# Patient Record
Sex: Male | Born: 1985
Health system: Southern US, Community
[De-identification: ages and names within clinical notes are randomized; demographics above are authoritative.]

## PROBLEM LIST (undated history)

## (undated) DIAGNOSIS — E1142 Type 2 diabetes mellitus with diabetic polyneuropathy: Secondary | ICD-10-CM

## (undated) DIAGNOSIS — J4530 Mild persistent asthma, uncomplicated: Secondary | ICD-10-CM

## (undated) DIAGNOSIS — J45909 Unspecified asthma, uncomplicated: Secondary | ICD-10-CM

## (undated) DIAGNOSIS — M14672 Charcot's joint, left ankle and foot: Secondary | ICD-10-CM

## (undated) DIAGNOSIS — E119 Type 2 diabetes mellitus without complications: Secondary | ICD-10-CM

## (undated) DIAGNOSIS — I509 Heart failure, unspecified: Secondary | ICD-10-CM

## (undated) DIAGNOSIS — E669 Obesity, unspecified: Secondary | ICD-10-CM

## (undated) DIAGNOSIS — I10 Essential (primary) hypertension: Secondary | ICD-10-CM

## (undated) DIAGNOSIS — E1169 Type 2 diabetes mellitus with other specified complication: Secondary | ICD-10-CM

## (undated) DIAGNOSIS — G4733 Obstructive sleep apnea (adult) (pediatric): Secondary | ICD-10-CM

## (undated) DIAGNOSIS — M79672 Pain in left foot: Secondary | ICD-10-CM

## (undated) DIAGNOSIS — I5022 Chronic systolic (congestive) heart failure: Secondary | ICD-10-CM

## (undated) DIAGNOSIS — E1140 Type 2 diabetes mellitus with diabetic neuropathy, unspecified: Secondary | ICD-10-CM

## (undated) HISTORY — PX: CARDIAC CATHETERIZATION: SHX172

## (undated) HISTORY — DX: Charcot's joint, left ankle and foot: M14.672

## (undated) HISTORY — DX: Type 2 diabetes mellitus with diabetic polyneuropathy: E11.42

## (undated) HISTORY — DX: Morbid (severe) obesity due to excess calories: E66.01

## (undated) HISTORY — DX: Type 2 diabetes mellitus with diabetic neuropathy, unspecified: E11.40

## (undated) HISTORY — DX: Mild persistent asthma, uncomplicated: J45.30

## (undated) HISTORY — DX: Pain in left foot: M79.672

## (undated) HISTORY — DX: Essential (primary) hypertension: I10

## (undated) HISTORY — PX: FOOT SURGERY: SHX648

## (undated) HISTORY — DX: Obesity, unspecified: E66.9

## (undated) HISTORY — DX: Type 2 diabetes mellitus without complications: E11.9

## (undated) HISTORY — DX: Obstructive sleep apnea (adult) (pediatric): G47.33

## (undated) HISTORY — DX: Chronic systolic (congestive) heart failure: I50.22

## (undated) HISTORY — DX: Type 2 diabetes mellitus with other specified complication: E11.69

---
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
## 2012-01-03 ENCOUNTER — Encounter (HOSPITAL_COMMUNITY): Payer: Self-pay | Admitting: Emergency Medicine

## 2012-01-03 ENCOUNTER — Emergency Department (INDEPENDENT_AMBULATORY_CARE_PROVIDER_SITE_OTHER)
Admission: EM | Admit: 2012-01-03 | Discharge: 2012-01-03 | Disposition: A | Discharge status: Home / Self Care | Payer: Self-pay | Source: Home / Self Care

## 2012-01-03 DIAGNOSIS — J9801 Acute bronchospasm: Secondary | ICD-10-CM

## 2012-01-03 DIAGNOSIS — R609 Edema, unspecified: Secondary | ICD-10-CM

## 2012-01-03 DIAGNOSIS — I10 Essential (primary) hypertension: Secondary | ICD-10-CM

## 2012-01-03 HISTORY — DX: Essential (primary) hypertension: I10

## 2012-01-03 HISTORY — DX: Unspecified asthma, uncomplicated: J45.909

## 2012-01-03 MED ORDER — ALBUTEROL SULFATE HFA 108 (90 BASE) MCG/ACT IN AERS: 2.0000 | INHALATION_SPRAY | RESPIRATORY_TRACT | Status: DC | PRN

## 2012-01-03 MED ORDER — HYDROCHLOROTHIAZIDE 25 MG PO TABS: 25.0000 mg | ORAL_TABLET | Freq: Every day | ORAL | Status: DC

## 2012-01-03 NOTE — ED Notes (Signed)
Reports he drank 3-12 pack (cans) of soft drinks over the past 3 days.

## 2012-01-03 NOTE — ED Notes (Signed)
Reports he was on hctz, but 4 years this drug was stopped.

## 2012-01-03 NOTE — ED Provider Notes (Signed)
History     CSN: [id number]  Arrival date & time 01/03/12  1619   None     Chief Complaint  Patient presents with  . Foot Swelling    (Consider location/radiation/quality/duration/timing/severity/associated sxs/prior treatment) HPI Comments: 26 year old male presents with bilateral lower extremity pitting edema. He states this was caused by drinking 36 cans of soda yesterday. He denies any known medical problems. Although he did not offer any information initially I discovered that he had been diagnosed with hypertension several years ago and was placed on hydrochlorothiazide. After 3-4 months of taking the medicine the doctor told him his blood pressure was normal and he did not need to take any more. Is no additional complaints today.   Past Medical History  Diagnosis Date  . Hypertension   . Asthma     Past Surgical History  Procedure Date  . Foot surgery     arch surgery    No family history on file.  History  Substance Use Topics  . Smoking status: Never Smoker   . Smokeless tobacco: Not on file  . Alcohol Use: No      Review of Systems  Constitutional: Negative for fever, activity change and fatigue.  HENT: Negative.   Respiratory: Negative for cough, chest tightness and shortness of breath.   Cardiovascular: Positive for leg swelling. Negative for chest pain and palpitations.  Gastrointestinal: Negative.   Genitourinary: Negative.   Skin: Negative for pallor and rash.  Neurological: Negative.     Allergies  Hydrocodone  Home Medications   Current Outpatient Rx  Name  Route  Sig  Dispense  Refill  . ALBUTEROL SULFATE HFA 108 (90 BASE) MCG/ACT IN AERS   Inhalation   Inhale 2 puffs into the lungs every 4 (four) hours as needed for wheezing. Dispense with aerochamber   1 Inhaler   0   . HYDROCHLOROTHIAZIDE 25 MG PO TABS   Oral   Take 1 tablet (25 mg total) by mouth daily.   30 tablet   0     BP 154/94  Pulse 94  Temp 98.2 F (36.8 C)  (Oral)  Resp 18  SpO2 100%  Physical Exam  Constitutional: He is oriented to person, place, and time. No distress.       Morbidly obese  Eyes: Conjunctivae normal and EOM are normal.  Neck: Normal range of motion. Neck supple.  Cardiovascular: Normal rate and normal heart sounds.   Pulmonary/Chest: Effort normal. He has wheezes.       Respiratory distress but breath sounds reveal bilateral diffuse mild to moderate wheezing. The patient states he has not noticed this.  Musculoskeletal: Normal range of motion. He exhibits edema.       3+ pitting edema of the lower extremities and feet bilaterally.  Neurological: He is alert and oriented to person, place, and time. No cranial nerve deficit.  Skin: Skin is warm and dry. No rash noted. No erythema.  Psychiatric: He has a normal mood and affect.    ED Course  Procedures (including critical care time)  Labs Reviewed - No data to display No results found.   1. Peripheral edema   2. HTN (hypertension)   3. Morbid obesity   4. Bronchospasm       MDM  HCTZ 25 mg one daily Albuterol HFA 2 puffs 4 times a day when necessary wheezing He must find a physician soon for a full workup. He is given the name of Dr. Julio Sicks. Is also advised  that he has multiple risk factors for heart disease, diabetes, renal failure, stroke and blood clots; Including morbid obesity, lower extremity edema, Wheezing, hypertension and possible target in organ damage such as problems with his kidneys.       Hayden Rasmussen, NP 01/03/12 2118

## 2012-01-03 NOTE — ED Notes (Signed)
Pt. Stated, both my feet are swollen because i drank a 3 pack of 12 yesterday of soda.

## 2012-01-03 NOTE — ED Notes (Signed)
Reports feet swelling that was noticed today. Patient denies swelling in extremities.  Patient has pitting edema.  Pitting edema in lower extremities.  No pain, no sob.

## 2012-01-04 NOTE — ED Provider Notes (Signed)
Medical screening examination/treatment/procedure(s) were performed by resident physician or non-physician practitioner and as supervising physician I was immediately available for consultation/collaboration.   Tiasha Helvie DOUGLAS MD.    Naethan Bracewell D Shiana Rappleye, MD 01/04/12 1046 

## 2012-11-27 ENCOUNTER — Encounter (HOSPITAL_COMMUNITY): Payer: Self-pay | Admitting: Emergency Medicine

## 2012-11-27 ENCOUNTER — Emergency Department (HOSPITAL_COMMUNITY)
Admission: EM | Admit: 2012-11-27 | Discharge: 2012-11-27 | Disposition: A | Discharge status: Home / Self Care | Payer: Self-pay | Attending: Emergency Medicine | Admitting: Emergency Medicine

## 2012-11-27 DIAGNOSIS — E669 Obesity, unspecified: Secondary | ICD-10-CM | POA: Insufficient documentation

## 2012-11-27 DIAGNOSIS — R000 Tachycardia, unspecified: Secondary | ICD-10-CM | POA: Insufficient documentation

## 2012-11-27 DIAGNOSIS — I10 Essential (primary) hypertension: Secondary | ICD-10-CM | POA: Insufficient documentation

## 2012-11-27 DIAGNOSIS — Z79899 Other long term (current) drug therapy: Secondary | ICD-10-CM | POA: Insufficient documentation

## 2012-11-27 DIAGNOSIS — K089 Disorder of teeth and supporting structures, unspecified: Secondary | ICD-10-CM | POA: Insufficient documentation

## 2012-11-27 DIAGNOSIS — J45909 Unspecified asthma, uncomplicated: Secondary | ICD-10-CM | POA: Insufficient documentation

## 2012-11-27 DIAGNOSIS — K029 Dental caries, unspecified: Secondary | ICD-10-CM | POA: Insufficient documentation

## 2012-11-27 HISTORY — DX: Obesity, unspecified: E66.9

## 2012-11-27 MED ORDER — NAPROXEN 500 MG PO TABS: 500.0000 mg | ORAL_TABLET | Freq: Two times a day (BID) | ORAL | Status: DC

## 2012-11-27 MED ORDER — AMOXICILLIN 500 MG PO CAPS: 500.0000 mg | ORAL_CAPSULE | Freq: Three times a day (TID) | ORAL | Status: DC

## 2012-11-27 NOTE — ED Notes (Signed)
Pt reports left side top and bottom teeth that started hurting him yesterday.

## 2012-11-27 NOTE — ED Notes (Signed)
Unable to get patient's temperature due to patient had just drank cold water.

## 2012-11-27 NOTE — ED Provider Notes (Signed)
CSN: [id number]     Arrival date & time 11/27/12  1319 History   First MD Initiated Contact with Patient 11/27/12 1522     Chief Complaint  Patient presents with  . Dental Pain   (Consider location/radiation/quality/duration/timing/severity/associated sxs/prior Treatment) Patient is a 27 y.o. male presenting with tooth pain. The history is provided by the patient.  Dental Pain Location:  Upper and lower Severity:  Severe Onset quality:  Gradual Duration:  1 day Timing:  Constant Progression:  Worsening Associated symptoms: no fever and no headaches    Decklin Weddington is a 27 y.o. male who presents to the ED with dental pain. The pain stated yesterday. The pain is in the upper and lower teeth.  Past Medical History  Diagnosis Date  . Hypertension   . Asthma   . Obesity    Past Surgical History  Procedure Laterality Date  . Foot surgery      arch surgery   No family history on file. History  Substance Use Topics  . Smoking status: Never Smoker   . Smokeless tobacco: Not on file  . Alcohol Use: No    Review of Systems  Constitutional: Negative for fever and chills.  HENT: Positive for dental problem. Negative for ear pain and sore throat.   Eyes: Negative for visual disturbance.  Respiratory: Negative for cough.   Gastrointestinal: Negative for nausea, vomiting and abdominal pain.  Musculoskeletal: Negative for back pain.  Skin: Negative for rash.  Neurological: Negative for headaches.  Psychiatric/Behavioral: The patient is not nervous/anxious.     Allergies  Hydrocodone  Home Medications   Current Outpatient Rx  Name  Route  Sig  Dispense  Refill  . albuterol (PROVENTIL HFA;VENTOLIN HFA) 108 (90 BASE) MCG/ACT inhaler   Inhalation   Inhale 2 puffs into the lungs every 4 (four) hours as needed for wheezing. Dispense with aerochamber   1 Inhaler   0   . hydrochlorothiazide (HYDRODIURIL) 25 MG tablet   Oral   Take 1 tablet (25 mg total) by mouth daily.   30  tablet   0    BP 161/97  Pulse 98  Resp 20  Ht 6\' 2"  (1.88 m)  Wt 323 lb (146.512 kg)  BMI 41.45 kg/m2  SpO2 100% Physical Exam  Nursing note and vitals reviewed. Constitutional: He is oriented to person, place, and time. No distress.  Morbidly obese  HENT:  Head: Normocephalic and atraumatic.  Mouth/Throat: Uvula is midline, oropharynx is clear and moist and mucous membranes are normal. Dental caries present.    Tenderness of the first molar upper and lower with dental caries noted.   Eyes: Conjunctivae and EOM are normal.  Neck: Neck supple.  Cardiovascular: Tachycardia present.   Pulmonary/Chest: Effort normal and breath sounds normal.  Musculoskeletal: Normal range of motion.  Lymphadenopathy:    He has no cervical adenopathy.  Neurological: He is alert and oriented to person, place, and time. No cranial nerve deficit.  Skin: Skin is warm and dry.  Psychiatric: He has a normal mood and affect. His behavior is normal.    ED Course  Procedures   MDM  27 y.o. male with dental pain due to caries. Will treat with antibiotics and pain medication and he will follow up with the dental clinic. Discussed with the patient plan of care and all questioned fully answered. He is stable for discharge home without any immediate complications.     Medication List    TAKE these medications  amoxicillin 500 MG capsule  Commonly known as:  AMOXIL  Take 1 capsule (500 mg total) by mouth 3 (three) times daily.     naproxen 500 MG tablet  Commonly known as:  NAPROSYN  Take 1 tablet (500 mg total) by mouth 2 (two) times daily.      ASK your doctor about these medications       albuterol 108 (90 BASE) MCG/ACT inhaler  Commonly known as:  PROVENTIL HFA;VENTOLIN HFA  Inhale 2 puffs into the lungs every 4 (four) hours as needed for wheezing. Dispense with aerochamber     hydrochlorothiazide 25 MG tablet  Commonly known as:  HYDRODIURIL  Take 1 tablet (25 mg total) by mouth  daily.        BP rechecked prior to discharge and is 161/97  Patient with know hypertension and taking medication. He is to follow up with his PCP for his blood pressure.    [facility] Orlene Och, NP 11/28/12 [address] [place], [place] 11/28/12 1626

## 2012-11-27 NOTE — ED Notes (Signed)
Pt has dental pain on rt upper and lower side.  Pain for 2 days, NAD.

## 2012-11-30 NOTE — ED Provider Notes (Signed)
Medical screening examination/treatment/procedure(s) were performed by non-physician practitioner and as supervising physician I was immediately available for consultation/collaboration.   Shaniece Bussa W Alexys Lobello, MD 11/30/12 0828 

## 2014-04-21 DIAGNOSIS — E669 Obesity, unspecified: Secondary | ICD-10-CM

## 2014-04-21 DIAGNOSIS — E1169 Type 2 diabetes mellitus with other specified complication: Secondary | ICD-10-CM

## 2014-04-21 HISTORY — DX: Type 2 diabetes mellitus with other specified complication: E66.9

## 2014-04-21 HISTORY — DX: Type 2 diabetes mellitus with other specified complication: E11.69

## 2016-04-19 ENCOUNTER — Emergency Department (HOSPITAL_COMMUNITY)
Admission: EM | Admit: 2016-04-19 | Discharge: 2016-04-20 | DRG: 871 | Discharge status: Against Medical Advice | Payer: Medicaid - Out of State | Attending: Internal Medicine | Admitting: Internal Medicine

## 2016-04-19 ENCOUNTER — Encounter (HOSPITAL_COMMUNITY): Payer: Self-pay

## 2016-04-19 ENCOUNTER — Emergency Department (HOSPITAL_COMMUNITY): Payer: Medicaid - Out of State

## 2016-04-19 DIAGNOSIS — I509 Heart failure, unspecified: Secondary | ICD-10-CM | POA: Diagnosis present

## 2016-04-19 DIAGNOSIS — I110 Hypertensive heart disease with heart failure: Secondary | ICD-10-CM | POA: Diagnosis present

## 2016-04-19 DIAGNOSIS — E119 Type 2 diabetes mellitus without complications: Secondary | ICD-10-CM | POA: Diagnosis present

## 2016-04-19 DIAGNOSIS — J9601 Acute respiratory failure with hypoxia: Secondary | ICD-10-CM | POA: Diagnosis not present

## 2016-04-19 DIAGNOSIS — E1169 Type 2 diabetes mellitus with other specified complication: Secondary | ICD-10-CM | POA: Diagnosis present

## 2016-04-19 DIAGNOSIS — Z79899 Other long term (current) drug therapy: Secondary | ICD-10-CM | POA: Diagnosis not present

## 2016-04-19 DIAGNOSIS — Z8673 Personal history of transient ischemic attack (TIA), and cerebral infarction without residual deficits: Secondary | ICD-10-CM | POA: Diagnosis not present

## 2016-04-19 DIAGNOSIS — R6510 Systemic inflammatory response syndrome (SIRS) of non-infectious origin without acute organ dysfunction: Secondary | ICD-10-CM | POA: Diagnosis present

## 2016-04-19 DIAGNOSIS — Z791 Long term (current) use of non-steroidal anti-inflammatories (NSAID): Secondary | ICD-10-CM | POA: Diagnosis not present

## 2016-04-19 DIAGNOSIS — A419 Sepsis, unspecified organism: Secondary | ICD-10-CM

## 2016-04-19 DIAGNOSIS — R0603 Acute respiratory distress: Secondary | ICD-10-CM

## 2016-04-19 DIAGNOSIS — E669 Obesity, unspecified: Secondary | ICD-10-CM | POA: Diagnosis not present

## 2016-04-19 DIAGNOSIS — Z9111 Patient's noncompliance with dietary regimen: Secondary | ICD-10-CM | POA: Diagnosis not present

## 2016-04-19 DIAGNOSIS — R0602 Shortness of breath: Secondary | ICD-10-CM | POA: Diagnosis present

## 2016-04-19 HISTORY — DX: Heart failure, unspecified: I50.9

## 2016-04-19 LAB — COMPREHENSIVE METABOLIC PANEL WITH GFR
ALT: 41 U/L (ref 17–63)
AST: 51 U/L — ABNORMAL HIGH (ref 15–41)
Albumin: 3.2 g/dL — ABNORMAL LOW (ref 3.5–5.0)
Alkaline Phosphatase: 63 U/L (ref 38–126)
Anion gap: 13 (ref 5–15)
BUN: 12 mg/dL (ref 6–20)
CO2: 25 mmol/L (ref 22–32)
Calcium: 9.6 mg/dL (ref 8.9–10.3)
Chloride: 96 mmol/L — ABNORMAL LOW (ref 101–111)
Creatinine, Ser: 0.93 mg/dL (ref 0.61–1.24)
GFR calc Af Amer: >60 mL/min
GFR calc non Af Amer: >60 mL/min
Glucose, Bld: 348 mg/dL — ABNORMAL HIGH (ref 65–99)
Potassium: 4.4 mmol/L (ref 3.5–5.1)
Sodium: 134 mmol/L — ABNORMAL LOW (ref 135–145)
Total Bilirubin: 0.4 mg/dL (ref 0.3–1.2)
Total Protein: 7.1 g/dL (ref 6.5–8.1)

## 2016-04-19 LAB — I-STAT CG4 LACTIC ACID, ED
Lactic Acid, Venous: 3.84 mmol/L (ref 0.5–1.9)
Lactic Acid, Venous: 2.92 mmol/L (ref 0.5–1.9)

## 2016-04-19 LAB — I-STAT VENOUS BLOOD GAS, ED
Acid-base deficit: 2 mmol/L (ref 0.0–2.0)
BICARBONATE: 22.7 mmol/L (ref 20.0–28.0)
O2 SAT: 72 %
PO2 VEN: 39 mmHg (ref 32.0–45.0)
TCO2: 24 mmol/L (ref 0–100)
pCO2, Ven: 39.8 mmHg — ABNORMAL LOW (ref 44.0–60.0)
pH, Ven: 7.365 (ref 7.250–7.430)

## 2016-04-19 LAB — CBC WITH DIFFERENTIAL/PLATELET
Basophils Absolute: 0 10*3/uL (ref 0.0–0.1)
Basophils Relative: 0 %
Eosinophils Absolute: 0 10*3/uL (ref 0.0–0.7)
Eosinophils Relative: 0 %
HCT: 39.5 % (ref 39.0–52.0)
Hemoglobin: 13.2 g/dL (ref 13.0–17.0)
Lymphocytes Relative: 27 %
Lymphs Abs: 2.7 10*3/uL (ref 0.7–4.0)
MCH: 29.9 pg (ref 26.0–34.0)
MCHC: 33.4 g/dL (ref 30.0–36.0)
MCV: 89.6 fL (ref 78.0–100.0)
Monocytes Absolute: 0.7 10*3/uL (ref 0.1–1.0)
Monocytes Relative: 7 %
Neutro Abs: 6.7 10*3/uL (ref 1.7–7.7)
Neutrophils Relative %: 66 %
Platelets: 287 10*3/uL (ref 150–400)
RBC: 4.41 MIL/uL (ref 4.22–5.81)
RDW: 12.9 % (ref 11.5–15.5)
WBC: 10.1 10*3/uL (ref 4.0–10.5)

## 2016-04-19 LAB — I-STAT TROPONIN, ED: Troponin i, poc: 0.14 ng/mL (ref 0.00–0.08)

## 2016-04-19 LAB — BRAIN NATRIURETIC PEPTIDE: B Natriuretic Peptide: 409.4 pg/mL — ABNORMAL HIGH (ref 0.0–100.0)

## 2016-04-19 MED ORDER — DEXTROSE 5 % IV SOLN
1.0000 g | Freq: Once | INTRAVENOUS | Status: AC
  Administered 2016-04-19: 1 g via INTRAVENOUS
  Filled 2016-04-19: qty 10

## 2016-04-19 MED ORDER — AMLODIPINE BESYLATE 5 MG PO TABS
10.0000 mg | ORAL_TABLET | Freq: Every day | ORAL | Status: DC
  Administered 2016-04-19: 10 mg via ORAL
  Filled 2016-04-19: qty 2

## 2016-04-19 MED ORDER — METOPROLOL TARTRATE 25 MG PO TABS
25.0000 mg | ORAL_TABLET | Freq: Once | ORAL | Status: AC
  Administered 2016-04-19: 25 mg via ORAL
  Filled 2016-04-19: qty 1

## 2016-04-19 MED ORDER — DEXTROSE 5 % IV SOLN: 1.0000 g | INTRAVENOUS | Status: DC

## 2016-04-19 MED ORDER — SODIUM CHLORIDE 0.9 % IV BOLUS (SEPSIS)
1000.0000 mL | Freq: Once | INTRAVENOUS | Status: AC
  Administered 2016-04-19: 1000 mL via INTRAVENOUS

## 2016-04-19 MED ORDER — IBUPROFEN 800 MG PO TABS
800.0000 mg | ORAL_TABLET | Freq: Once | ORAL | Status: AC
  Administered 2016-04-19: 800 mg via ORAL
  Filled 2016-04-19: qty 1

## 2016-04-19 MED ORDER — METOCLOPRAMIDE HCL 5 MG/ML IJ SOLN
10.0000 mg | Freq: Once | INTRAMUSCULAR | Status: AC
  Administered 2016-04-19: 10 mg via INTRAVENOUS
  Filled 2016-04-19: qty 2

## 2016-04-19 MED ORDER — AMLODIPINE BESYLATE 5 MG PO TABS: 5.0000 mg | ORAL_TABLET | Freq: Every day | ORAL | Status: DC

## 2016-04-19 MED ORDER — DEXTROSE 5 % IV SOLN: 500.0000 mg | INTRAVENOUS | Status: DC

## 2016-04-19 MED ORDER — DEXTROSE 5 % IV SOLN
500.0000 mg | Freq: Once | INTRAVENOUS | Status: AC
  Administered 2016-04-19: 500 mg via INTRAVENOUS
  Filled 2016-04-19: qty 500

## 2016-04-19 MED ORDER — SODIUM CHLORIDE 0.9 % IV BOLUS (SEPSIS): 1000.0000 mL | Freq: Once | INTRAVENOUS | Status: DC

## 2016-04-19 MED ORDER — ACETAMINOPHEN 500 MG PO TABS
1000.0000 mg | ORAL_TABLET | Freq: Once | ORAL | Status: AC
  Administered 2016-04-19: 1000 mg via ORAL
  Filled 2016-04-19: qty 2

## 2016-04-19 NOTE — ED Provider Notes (Signed)
845 pmComplains of cough productive of yellowish and greenish sputum for the past 3-4 days. He vomited one time yesterday. Other complaints include shortness of breath. He admits to not taking his blood pressure medication  today. On exam appears uncomfortable. Speaks in short sentences Lungs with Tachypnea. lungs clear to auscultation. Heart tachycardic regular rhythm abdomen morbidly obese, nontender. Code sepsis called based on Sirs criteria tachypnea and tachycardia and fever. Likely source of infection respiratory.  11:40 PM patient's breathing is improved and he looks improved after treatment in the emergency department. He appears in less than respiratory distress. Is able to speak in paragraphs  Chest x-ray viewed by me Results for orders placed or performed during the hospital encounter of 04/19/16  CBC with Differential  Result Value Ref Range   WBC 10.1 4.0 - 10.5 K/uL   RBC 4.41 4.22 - 5.81 MIL/uL   Hemoglobin 13.2 13.0 - 17.0 g/dL   HCT 06.2 37.6 - 28.3 %   MCV 89.6 78.0 - 100.0 fL   MCH 29.9 26.0 - 34.0 pg   MCHC 33.4 30.0 - 36.0 g/dL   RDW 15.1 76.1 - 60.7 %   Platelets 287 150 - 400 K/uL   Neutrophils Relative % 66 %   Neutro Abs 6.7 1.7 - 7.7 K/uL   Lymphocytes Relative 27 %   Lymphs Abs 2.7 0.7 - 4.0 K/uL   Monocytes Relative 7 %   Monocytes Absolute 0.7 0.1 - 1.0 K/uL   Eosinophils Relative 0 %   Eosinophils Absolute 0.0 0.0 - 0.7 K/uL   Basophils Relative 0 %   Basophils Absolute 0.0 0.0 - 0.1 K/uL  Comprehensive metabolic panel  Result Value Ref Range   Sodium 134 (L) 135 - 145 mmol/L   Potassium 4.4 3.5 - 5.1 mmol/L   Chloride 96 (L) 101 - 111 mmol/L   CO2 25 22 - 32 mmol/L   Glucose, Bld 348 (H) 65 - 99 mg/dL   BUN 12 6 - 20 mg/dL   Creatinine, Ser 3.71 0.61 - 1.24 mg/dL   Calcium 9.6 8.9 - 06.2 mg/dL   Total Protein 7.1 6.5 - 8.1 g/dL   Albumin 3.2 (L) 3.5 - 5.0 g/dL   AST 51 (H) 15 - 41 U/L   ALT 41 17 - 63 U/L   Alkaline Phosphatase 63 38 - 126 U/L    Total Bilirubin 0.4 0.3 - 1.2 mg/dL   GFR calc non Af Amer >60 >60 mL/min   GFR calc Af Amer >60 >60 mL/min   Anion gap 13 5 - 15  Brain natriuretic peptide  Result Value Ref Range   B Natriuretic Peptide 409.4 (H) 0.0 - 100.0 pg/mL  I-Stat CG4 Lactic Acid, ED  Result Value Ref Range   Lactic Acid, Venous 3.84 (HH) 0.5 - 1.9 mmol/L   Comment NOTIFIED PHYSICIAN   I-Stat Troponin, ED (not at [facility])  Result Value Ref Range   Troponin i, poc 0.14 (HH) 0.00 - 0.08 ng/mL   Comment NOTIFIED PHYSICIAN    Comment 3          I-Stat Venous Blood Gas, ED (order at [facility] only)  Result Value Ref Range   pH, Ven 7.365 7.250 - 7.430   pCO2, Ven 39.8 (L) 44.0 - 60.0 mmHg   pO2, Ven 39.0 32.0 - 45.0 mmHg   Bicarbonate 22.7 20.0 - 28.0 mmol/L   TCO2 24 0 - 100 mmol/L   O2 Saturation 72.0 %   Acid-base deficit  2.0 0.0 - 2.0 mmol/L   Patient temperature HIDE    Sample type VENOUS    Comment NOTIFIED PHYSICIAN   I-Stat CG4 Lactic Acid, ED  Result Value Ref Range   Lactic Acid, Venous 2.92 (HH) 0.5 - 1.9 mmol/L   Comment NOTIFIED PHYSICIAN    Dg Chest Port 1 View  Result Date: 04/19/2016 CLINICAL DATA:  Fevers at home with cough since sputum production for past 3 days EXAM: PORTABLE CHEST 1 VIEW COMPARISON:  None. FINDINGS: The heart size and mediastinal contours are within normal limits. Both lungs are clear. The visualized skeletal structures are unremarkable. IMPRESSION: No active disease. Electronically Signed   By: Elige Ko   On: 04/19/2016 21:41  CRITICAL CARE Performed by: Doug Sou Total critical care time: 30 minutes Critical care time was exclusive of separately billable procedures and treating other patients. Critical care was necessary to treat or prevent imminent or life-threatening deterioration. Critical care was time spent personally by me on the following activities: development of treatment plan with patient and/or surrogate as well as nursing, discussions with  consultants, evaluation of patient's response to treatment, examination of patient, obtaining history from patient or surrogate, ordering and performing treatments and interventions, ordering and review of laboratory studies, ordering and review of radiographic studies, pulse oximetry and re-evaluation of patient's condition.     Doug Sou, MD 04/19/16 [phone]

## 2016-04-19 NOTE — ED Notes (Signed)
Pt reported to this RN that central chest pain is back. Pt describes pain as "poking" and tightness. Pt rates pain 8/10. Pt is hyperventilating, and restless in bed. Pt encouraged to slow breathing. Dr. Ethelda Chick aware and at bedside assessing pt.

## 2016-04-19 NOTE — Progress Notes (Signed)
Pharmacy Antibiotic Note  Jonathan [name] is a 31 y.o. male admitted on 04/19/2016 with sepsis/CAP.  Pharmacy has been consulted for azithromycin and ceftriaxone dosing.  Patient reported fevers at home with cough and sputum production for past 3 days. He has Tmax 100.5 and tachycardia in the ED. WBC is 10.1 and lactic acid 3.84.   Plan: Azithromycin 500mg  IV every 24 hours Ceftriaxone 1g IV every 24 hours Follow up clinical progress and ability to take oral mediations   Temp (24hrs), Avg:100.5 F (38.1 C), Min:100.5 F (38.1 C), Max:100.5 F (38.1 C)   Recent Labs Lab 04/19/16 2005 04/19/16 2021  WBC 10.1  --   LATICACIDVEN  --  3.84*    CrCl cannot be calculated (No order found.).    Allergies  Allergen Reactions  . Hydrocodone     Antimicrobials this admission: 3/5 azithromycin >>  3/5 ceftriaxone >>   Dose adjustments this admission: N/A  Microbiology results: N/A  Thank you for allowing pharmacy to be a part of this patient's care.  Milus Mallick [facility] 04/19/2016 9:02 PM

## 2016-04-19 NOTE — ED Notes (Signed)
MD and RN Notified of I-stat Lactic Acid resulting 3.84.

## 2016-04-19 NOTE — ED Triage Notes (Signed)
Pt complaining of SOB. Pt states cough x 3 days, coughing up greenish/red sputum. Pt states fevers and chills at home. Pt tachy at triage, 130bpm. Pt a/o x 4 at triage.

## 2016-04-19 NOTE — ED Notes (Signed)
Code Sepsis @ 2059

## 2016-04-19 NOTE — ED Notes (Signed)
MD and RN notified of I-STAT Troponin resulting  0.14

## 2016-04-19 NOTE — ED Provider Notes (Signed)
[facility]-EMERGENCY DEPT Provider Note   CSN: [id number] Arrival date & time: 04/19/16  1955     History   Chief Complaint Chief Complaint  Patient presents with  . Shortness of Breath  . Tachycardia    HPI Jonathan [name] is a 31 y.o. male.  HPI Pt complaining of SOB. Pt states cough x 3 days, coughing up greenish/red sputum. Pt states fevers and chills at home Wife is at the bedside and rely some of the history as well They state that patient began with a sore throat and congestion, cough. He was waking up at night with chills and feeling feverish. He states he feels like he is breathing rapidly and sometimes coughs so much that he feels short of breath. When he coughed a lot he does have some chest pain but does not have constant chest pain. He states he has had a stroke but does not have any focal deficits or deficits from the stroke and cannot remember details regarding this. He is also diabetic and hypertensive and took his medications today He usually takes amlodipine and Lasix but does endorse he has not been eating very well recently over last 2 weeks as his wife was not cooking He denies any history of CHF but does state that his legs have appeared more swollen recently Denies any recent antibiotics, no dysuria, no recent hospitalization, no history of PE or DVT, no hemoptysis, no recent long trip, no recent surgery, no leg swelling or calf pain, patient is not on any blood thinners or testosterone Never a smoker  Past Medical History:  Diagnosis Date  . Asthma   . Hypertension   . Obesity     Patient Active Problem List   Diagnosis Date Noted  . SIRS (systemic inflammatory response syndrome) (HCC) 04/20/2016    Past Surgical History:  Procedure Laterality Date  . FOOT SURGERY     arch surgery       Home Medications    Prior to Admission medications   Medication Sig Start Date End Date Taking? Authorizing Provider  albuterol (PROVENTIL HFA;VENTOLIN HFA) 108  (90 BASE) MCG/ACT inhaler Inhale 2 puffs into the lungs every 4 (four) hours as needed for wheezing. Dispense with aerochamber 01/03/12  Yes Hayden Rasmussen, NP  amLODipine (NORVASC) 5 MG tablet Take 5 mg by mouth daily.   Yes Historical Provider, MD  Cholecalciferol (VITAMIN D PO) Take 1 tablet by mouth daily.   Yes Historical Provider, MD  fluticasone furoate-vilanterol (BREO ELLIPTA) 200-25 MCG/INH AEPB Inhale 1 puff into the lungs daily.   Yes Historical Provider, MD  furosemide (LASIX) 40 MG tablet Take 40 mg by mouth 2 (two) times daily.   Yes Historical Provider, MD  amoxicillin (AMOXIL) 500 MG capsule Take 1 capsule (500 mg total) by mouth 3 (three) times daily. Patient not taking: Reported on 04/19/2016 11/27/12   Janne Napoleon, NP  hydrochlorothiazide (HYDRODIURIL) 25 MG tablet Take 1 tablet (25 mg total) by mouth daily. Patient not taking: Reported on 04/19/2016 01/03/12   Hayden Rasmussen, NP  naproxen (NAPROSYN) 500 MG tablet Take 1 tablet (500 mg total) by mouth 2 (two) times daily. Patient not taking: Reported on 04/19/2016 11/27/12   Janne Napoleon, NP    Family History History reviewed. No pertinent family history.  Social History Social History  Substance Use Topics  . Smoking status: Never Smoker  . Smokeless tobacco: Never Used  . Alcohol use No     Allergies   Hydrocodone  Review of Systems Review of Systems  Constitutional: Positive for fever.  Allergic/Immunologic: Negative for immunocompromised state.  All other systems reviewed and are negative.    Physical Exam Updated Vital Signs BP (!) 201/149   Pulse (!) 125   Temp 100.5 F (38.1 C) (Oral)   Resp 22   SpO2 98%   Physical Exam  Constitutional: He appears well-developed and well-nourished. No distress.  HENT:  Head: Normocephalic and atraumatic.  Nose: Nose normal.  Mouth/Throat: Oropharynx is clear and moist. No oropharyngeal exudate.  Eyes: Conjunctivae are normal. Pupils are equal, round, and reactive to  light. Right eye exhibits no discharge. Left eye exhibits no discharge.  Neck: Normal range of motion. Neck supple.  Cardiovascular: Regular rhythm.   No murmur heard. tachycardic  Pulmonary/Chest: Breath sounds normal. He is in respiratory distress. He has no wheezes. He has no rales.  tachypnic  Abdominal: Soft. Bowel sounds are normal. He exhibits no distension and no mass. There is no tenderness. There is no rebound and no guarding.  Musculoskeletal: He exhibits no edema.  Neurological: He is alert.  Skin: Skin is warm. He is not diaphoretic.  Psychiatric: He has a normal mood and affect.     ED Treatments / Results  Labs (all labs ordered are listed, but only abnormal results are displayed) Labs Reviewed  COMPREHENSIVE METABOLIC PANEL - Abnormal; Notable for the following:       Result Value   Sodium 134 (*)    Chloride 96 (*)    Glucose, Bld 348 (*)    Albumin 3.2 (*)    AST 51 (*)    All other components within normal limits  BRAIN NATRIURETIC PEPTIDE - Abnormal; Notable for the following:    B Natriuretic Peptide 409.4 (*)    All other components within normal limits  I-STAT CG4 LACTIC ACID, ED - Abnormal; Notable for the following:    Lactic Acid, Venous 3.84 (*)    All other components within normal limits  I-STAT TROPOININ, ED - Abnormal; Notable for the following:    Troponin i, poc 0.14 (*)    All other components within normal limits  I-STAT VENOUS BLOOD GAS, ED - Abnormal; Notable for the following:    pCO2, Ven 39.8 (*)    All other components within normal limits  I-STAT CG4 LACTIC ACID, ED - Abnormal; Notable for the following:    Lactic Acid, Venous 2.92 (*)    All other components within normal limits  CULTURE, BLOOD (ROUTINE X 2)  CULTURE, BLOOD (ROUTINE X 2)  CBC WITH DIFFERENTIAL/PLATELET  INFLUENZA PANEL BY PCR (TYPE A & B)    EKG  EKG Interpretation  Date/Time:  Monday April 19 2016 22:05:36 EST Ventricular Rate:  123 PR Interval:    QRS  Duration: 133 QT Interval:  349 QTC Calculation: 500 R Axis:   -171 Text Interpretation:  Sinus tachycardia Probable left atrial enlargement RBBB and LPFB No significant change since last tracing Confirmed by Ethelda Chick  MD, SAM [phone]) on 04/19/2016 10:14:49 PM       Radiology Dg Chest Port 1 View  Result Date: 04/19/2016 CLINICAL DATA:  Fevers at home with cough since sputum production for past 3 days EXAM: PORTABLE CHEST 1 VIEW COMPARISON:  None. FINDINGS: The heart size and mediastinal contours are within normal limits. Both lungs are clear. The visualized skeletal structures are unremarkable. IMPRESSION: No active disease. Electronically Signed   By: Elige Ko   On: 04/19/2016 21:41  Procedures Procedures (including critical care time)  Medications Ordered in ED Medications  azithromycin (ZITHROMAX) 500 mg in dextrose 5 % 250 mL IVPB (not administered)  cefTRIAXone (ROCEPHIN) 1 g in dextrose 5 % 50 mL IVPB (not administered)  amLODipine (NORVASC) tablet 10 mg (10 mg Oral Given 04/19/16 2224)  albuterol (PROVENTIL,VENTOLIN) solution continuous neb (not administered)  acetaminophen (TYLENOL) tablet 1,000 mg (1,000 mg Oral Given 04/19/16 2147)  ibuprofen (ADVIL,MOTRIN) tablet 800 mg (800 mg Oral Given 04/19/16 2147)  metoCLOPramide (REGLAN) injection 10 mg (10 mg Intravenous Given 04/19/16 2148)  sodium chloride 0.9 % bolus 1,000 mL (0 mLs Intravenous Stopped 04/19/16 2200)  sodium chloride 0.9 % bolus 1,000 mL (0 mLs Intravenous Stopped 04/20/16 0016)  cefTRIAXone (ROCEPHIN) 1 g in dextrose 5 % 50 mL IVPB (0 g Intravenous Stopped 04/19/16 2159)  azithromycin (ZITHROMAX) 500 mg in dextrose 5 % 250 mL IVPB (0 mg Intravenous Stopped 04/19/16 2304)  metoprolol tartrate (LOPRESSOR) tablet 25 mg (25 mg Oral Given 04/19/16 2224)     Initial Impression / Assessment and Plan / ED Course  I have reviewed the triage vital signs and the nursing notes.  Pertinent labs & imaging results that were  available during my care of the patient were reviewed by me and considered in my medical decision making (see chart for details).     Code sepsis activated upon patient arrival, covered with community acquired pneumonia antibiotics given concern for such. However, chest x-ray without pneumonia. Patient does have some viral etiology symptoms which I suspect are driving part her symptoms but less likely myo carditis. Patient does have a known diagnosis of CHF and has not been compliant with his diet over the last 2 weeks, also has an exacerbation of his CHF. Patient also is supposed to have a CPAP but has not had a CPAP at home as his insurance has not covered it, suspect significant component. Patient assessed multiple times throughout his ED course and noted to become hypoxic while sleeping, to 85% with an excellent waveform: However, if patient woken up, he is 96% on room air.  Sepsis - Repeat Assessment  Performed at:    12:22  Vitals     Blood pressure 140/83, pulse 108, temperature 99 F (37.2 C), temperature source Oral, resp. rate 22, SpO2 90 %.  Heart:     Regular rate and rhythm  Lungs:    Wheezing  Capillary Refill:   <2 sec  Peripheral Pulse:   Radial pulse palpable  Skin:     Normal Color  Did initially get fluids due to LA, but now trending down; albuterol ordered for wheezing  Given the patient has ongoing tachycardia, shortness of breath from CHF exacerbation as well as infectious etiology, will admit for further management and monitoring  Final Clinical Impressions(s) / ED Diagnoses   Final diagnoses:  Acute on chronic congestive heart failure, unspecified congestive heart failure type [facility])    New Prescriptions New Prescriptions   No medications on file     Sidney Ace, MD 04/20/16 0031    Doug Sou, MD 04/20/16 [phone]

## 2016-04-20 ENCOUNTER — Encounter (HOSPITAL_COMMUNITY): Payer: Self-pay | Admitting: Internal Medicine

## 2016-04-20 DIAGNOSIS — J9601 Acute respiratory failure with hypoxia: Secondary | ICD-10-CM | POA: Diagnosis not present

## 2016-04-20 DIAGNOSIS — E669 Obesity, unspecified: Secondary | ICD-10-CM | POA: Diagnosis not present

## 2016-04-20 DIAGNOSIS — E1169 Type 2 diabetes mellitus with other specified complication: Secondary | ICD-10-CM

## 2016-04-20 DIAGNOSIS — R6510 Systemic inflammatory response syndrome (SIRS) of non-infectious origin without acute organ dysfunction: Secondary | ICD-10-CM | POA: Diagnosis not present

## 2016-04-20 HISTORY — DX: Type 2 diabetes mellitus with other specified complication: E11.69

## 2016-04-20 LAB — INFLUENZA PANEL BY PCR (TYPE A & B)
Influenza A By PCR: NEGATIVE
Influenza B By PCR: NEGATIVE

## 2016-04-20 MED ORDER — BUDESONIDE 0.25 MG/2ML IN SUSP
0.2500 mg | Freq: Two times a day (BID) | RESPIRATORY_TRACT | Status: DC
  Filled 2016-04-20 (×2): qty 2

## 2016-04-20 MED ORDER — LEVALBUTEROL HCL 0.63 MG/3ML IN NEBU: 0.6300 mg | INHALATION_SOLUTION | RESPIRATORY_TRACT | Status: DC | PRN

## 2016-04-20 MED ORDER — ACETAMINOPHEN 650 MG RE SUPP: 650.0000 mg | Freq: Four times a day (QID) | RECTAL | Status: DC | PRN

## 2016-04-20 MED ORDER — ACETAMINOPHEN 325 MG PO TABS: 650.0000 mg | ORAL_TABLET | Freq: Four times a day (QID) | ORAL | Status: DC | PRN

## 2016-04-20 MED ORDER — ENOXAPARIN SODIUM 40 MG/0.4ML ~~LOC~~ SOLN: 40.0000 mg | SUBCUTANEOUS | Status: DC

## 2016-04-20 MED ORDER — ALBUTEROL (5 MG/ML) CONTINUOUS INHALATION SOLN: 5.0000 mg/h | INHALATION_SOLUTION | Freq: Once | RESPIRATORY_TRACT | Status: DC

## 2016-04-20 MED ORDER — LEVALBUTEROL HCL 0.63 MG/3ML IN NEBU
0.6300 mg | INHALATION_SOLUTION | RESPIRATORY_TRACT | Status: DC
  Administered 2016-04-20: 0.63 mg via RESPIRATORY_TRACT
  Filled 2016-04-20: qty 3

## 2016-04-20 MED ORDER — ALBUTEROL (5 MG/ML) CONTINUOUS INHALATION SOLN
10.0000 mg/h | INHALATION_SOLUTION | Freq: Once | RESPIRATORY_TRACT | Status: DC
  Filled 2016-04-20: qty 20

## 2016-04-20 MED ORDER — INSULIN ASPART 100 UNIT/ML ~~LOC~~ SOLN: 0.0000 [IU] | Freq: Three times a day (TID) | SUBCUTANEOUS | Status: DC

## 2016-04-20 NOTE — H&P (Signed)
History and Physical    Jonathan [name] WFU:[id number] DOB: 1985/06/27 DOA: 04/19/2016  PCP: No PCP Per Patient  Patient coming from: Home.  Chief Complaint: Shortness of breath and wheezing.  HPI: Jonathan [name] is a 31 y.o. male with history of CHF, morbid obesity, diabetes mellitus who is visiting [facility] from the ER presents to the ER because of worsening shortness of breath. Patient states over the last 4-5 days patient has been having wheezing and pleuritic type of chest pain and shortness of breath. Patient also having subjective feeling of fever and chills.   ED Course: In the ER patient was found to be tachycardic febrile with elevated lactate levels. Chest x-ray was unremarkable. Patient was given fluid bolus blood cultures obtained and started on empiric antibiotics for possible pneumonia.  Review of Systems: As per HPI, rest all negative.   Past Medical History:  Diagnosis Date  . Asthma   . CHF (congestive heart failure) (HCC)   . Hypertension   . Obesity     Past Surgical History:  Procedure Laterality Date  . CARDIAC CATHETERIZATION    . FOOT SURGERY     arch surgery     reports that he has never smoked. He has never used smokeless tobacco. He reports that he does not drink alcohol or use drugs.  Allergies  Allergen Reactions  . Hydrocodone     Hives     Family History  Problem Relation Age of Onset  . Diabetes Mellitus II Mother   . Congestive Heart Failure Neg Hx     Prior to Admission medications   Medication Sig Start Date End Date Taking? Authorizing Provider  albuterol (PROVENTIL HFA;VENTOLIN HFA) 108 (90 BASE) MCG/ACT inhaler Inhale 2 puffs into the lungs every 4 (four) hours as needed for wheezing. Dispense with aerochamber 01/03/12  Yes Hayden Rasmussen, NP  amLODipine (NORVASC) 5 MG tablet Take 5 mg by mouth daily.   Yes Historical Provider, MD  Cholecalciferol (VITAMIN D PO) Take 1 tablet by mouth daily.   Yes Historical Provider, MD  fluticasone  furoate-vilanterol (BREO ELLIPTA) 200-25 MCG/INH AEPB Inhale 1 puff into the lungs daily.   Yes Historical Provider, MD  furosemide (LASIX) 40 MG tablet Take 40 mg by mouth 2 (two) times daily.   Yes Historical Provider, MD  amoxicillin (AMOXIL) 500 MG capsule Take 1 capsule (500 mg total) by mouth 3 (three) times daily. Patient not taking: Reported on 04/19/2016 11/27/12   Janne Napoleon, NP  hydrochlorothiazide (HYDRODIURIL) 25 MG tablet Take 1 tablet (25 mg total) by mouth daily. Patient not taking: Reported on 04/19/2016 01/03/12   Hayden Rasmussen, NP  naproxen (NAPROSYN) 500 MG tablet Take 1 tablet (500 mg total) by mouth 2 (two) times daily. Patient not taking: Reported on 04/19/2016 11/27/12   Janne Napoleon, NP    Physical Exam: Vitals:   04/19/16 2300 04/19/16 2330 04/20/16 0015 04/20/16 0100  BP: (!) 179/117 140/86 140/83 173/98  Pulse: 116 108  101  Resp: (!) 28 22  23   Temp:      TempSrc:      SpO2: 93% 90%  96%      Constitutional: Obese not in distress. Vitals:   04/19/16 2300 04/19/16 2330 04/20/16 0015 04/20/16 0100  BP: (!) 179/117 140/86 140/83 173/98  Pulse: 116 108  101  Resp: (!) 28 22  23   Temp:      TempSrc:      SpO2: 93% 90%  96%  Eyes: Anicteric no pallor. ENMT: No discharge from the ears eyes nose and mouth. Neck: No mass felt. No JVD appreciated. Respiratory: Bilateral expiratory wheezes heard no crepitations. Cardiovascular: S1 and S2 heard no murmurs appreciated. Abdomen: Soft nontender bowel sounds present. Musculoskeletal: No edema. No joint effusion. Skin: No rash. Skin appears warm. Neurologic: Alert awake oriented to time place and person. Moves all extremities. Psychiatric: Appears normal. Normal affect.   Labs on Admission: I have personally reviewed following labs and imaging studies  CBC:  Recent Labs Lab 04/19/16 2005  WBC 10.1  NEUTROABS 6.7  HGB 13.2  HCT 39.5  MCV 89.6  PLT 287   Basic Metabolic Panel:  Recent Labs Lab  04/19/16 2005  NA 134*  K 4.4  CL 96*  CO2 25  GLUCOSE 348*  BUN 12  CREATININE 0.93  CALCIUM 9.6   GFR: CrCl cannot be calculated (Unknown ideal weight.). Liver Function Tests:  Recent Labs Lab 04/19/16 2005  AST 51*  ALT 41  ALKPHOS 63  BILITOT 0.4  PROT 7.1  ALBUMIN 3.2*   No results for input(s): LIPASE, AMYLASE in the last 168 hours. No results for input(s): AMMONIA in the last 168 hours. Coagulation Profile: No results for input(s): INR, PROTIME in the last 168 hours. Cardiac Enzymes: No results for input(s): CKTOTAL, CKMB, CKMBINDEX, TROPONINI in the last 168 hours. BNP (last 3 results) No results for input(s): PROBNP in the last 8760 hours. HbA1C: No results for input(s): HGBA1C in the last 72 hours. CBG: No results for input(s): GLUCAP in the last 168 hours. Lipid Profile: No results for input(s): CHOL, HDL, LDLCALC, TRIG, CHOLHDL, LDLDIRECT in the last 72 hours. Thyroid Function Tests: No results for input(s): TSH, T4TOTAL, FREET4, T3FREE, THYROIDAB in the last 72 hours. Anemia Panel: No results for input(s): VITAMINB12, FOLATE, FERRITIN, TIBC, IRON, RETICCTPCT in the last 72 hours. Urine analysis: No results found for: COLORURINE, APPEARANCEUR, LABSPEC, PHURINE, GLUCOSEU, HGBUR, BILIRUBINUR, KETONESUR, PROTEINUR, UROBILINOGEN, NITRITE, LEUKOCYTESUR Sepsis Labs: @LABRCNTIP (procalcitonin:4,lacticidven:4) )No results found for this or any previous visit (from the past 240 hour(s)).   Radiological Exams on Admission: Dg Chest Port 1 View  Result Date: 04/19/2016 CLINICAL DATA:  Fevers at home with cough since sputum production for past 3 days EXAM: PORTABLE CHEST 1 VIEW COMPARISON:  None. FINDINGS: The heart size and mediastinal contours are within normal limits. Both lungs are clear. The visualized skeletal structures are unremarkable. IMPRESSION: No active disease. Electronically Signed   By: Elige Ko   On: 04/19/2016 21:41    EKG: Independently  reviewed. Sinus tachycardia with RBBB.  Assessment/Plan Active Problems:   SIRS (systemic inflammatory response syndrome) (HCC)   Acute respiratory failure with hypoxia (HCC)   Congestive heart failure (CHF) (HCC)   Diabetes mellitus type 2 in obese (HCC)    1. SIRS - likely but possible pneumonia or viral bronchitis. Follow influenza PCR blood cultures patient is on ceftriaxone and Zithromax. Continue with nebulizer treatment. Follow lactate levels. 2. Uncontrolled hypertension - will place patient on when necessary IV hydralazine and continue home dose of amlodipine. 3. History of CHF unspecified EF - will hold off Lasix due to patient having signs of SIRS. Patient did receive fluid bolus. 4. Diabetes mellitus type 2 - patient does not recall his diabetic medication. Will place patient on sliding scale coverage. 5. History of stroke no new symptoms at this time.   DVT prophylaxis: Lovenox. Code Status: Full code.  Family Communication: Discussed with patient.  Disposition Plan:  Home.  Consults called: None.  Admission status: Inpatient.    Eduard Clos MD [facility] Pager [phone].  If 7PM-7AM, please contact night-coverage [email or web address] Password [email or web address]  04/20/2016, 1:27 AM

## 2016-04-20 NOTE — ED Notes (Addendum)
Pt spoke with MD on phone, pt verbalizes understanding of leaving AMA, pt states that he has to leave AMA to catch a flight to [place] today at 2pm, stats 89% on room air

## 2016-04-20 NOTE — ED Notes (Signed)
Pt states that he wants to leave AMA, pt alert and oriented x 4, pt states that he wants answers about his condition and girlfriend states no one is giving him answers. Pt understands risks of leaving AMA. MD made aware.

## 2016-04-24 LAB — CULTURE, BLOOD (ROUTINE X 2)
CULTURE: NO GROWTH
Culture: NO GROWTH

## 2016-10-26 ENCOUNTER — Encounter (HOSPITAL_COMMUNITY): Payer: Self-pay | Admitting: Emergency Medicine

## 2016-10-26 ENCOUNTER — Inpatient Hospital Stay (HOSPITAL_COMMUNITY)
Admission: EM | Admit: 2016-10-26 | Discharge: 2016-10-29 | DRG: 292 | Disposition: A | Discharge status: Home / Self Care | Payer: BLUE CROSS/BLUE SHIELD | Attending: Internal Medicine | Admitting: Internal Medicine

## 2016-10-26 ENCOUNTER — Emergency Department (HOSPITAL_COMMUNITY): Payer: BLUE CROSS/BLUE SHIELD

## 2016-10-26 DIAGNOSIS — Z7982 Long term (current) use of aspirin: Secondary | ICD-10-CM

## 2016-10-26 DIAGNOSIS — G4733 Obstructive sleep apnea (adult) (pediatric): Secondary | ICD-10-CM | POA: Diagnosis present

## 2016-10-26 DIAGNOSIS — I4510 Unspecified right bundle-branch block: Secondary | ICD-10-CM | POA: Diagnosis present

## 2016-10-26 DIAGNOSIS — I428 Other cardiomyopathies: Secondary | ICD-10-CM | POA: Diagnosis present

## 2016-10-26 DIAGNOSIS — E66813 Obesity, class 3: Secondary | ICD-10-CM

## 2016-10-26 DIAGNOSIS — I509 Heart failure, unspecified: Secondary | ICD-10-CM | POA: Diagnosis not present

## 2016-10-26 DIAGNOSIS — I248 Other forms of acute ischemic heart disease: Secondary | ICD-10-CM | POA: Diagnosis present

## 2016-10-26 DIAGNOSIS — J4531 Mild persistent asthma with (acute) exacerbation: Secondary | ICD-10-CM | POA: Diagnosis not present

## 2016-10-26 DIAGNOSIS — Z9114 Patient's other noncompliance with medication regimen: Secondary | ICD-10-CM | POA: Diagnosis not present

## 2016-10-26 DIAGNOSIS — I10 Essential (primary) hypertension: Secondary | ICD-10-CM

## 2016-10-26 DIAGNOSIS — J45909 Unspecified asthma, uncomplicated: Secondary | ICD-10-CM | POA: Diagnosis present

## 2016-10-26 DIAGNOSIS — K5900 Constipation, unspecified: Secondary | ICD-10-CM | POA: Diagnosis present

## 2016-10-26 DIAGNOSIS — E1169 Type 2 diabetes mellitus with other specified complication: Secondary | ICD-10-CM

## 2016-10-26 DIAGNOSIS — I160 Hypertensive urgency: Secondary | ICD-10-CM

## 2016-10-26 DIAGNOSIS — J4530 Mild persistent asthma, uncomplicated: Secondary | ICD-10-CM

## 2016-10-26 DIAGNOSIS — E119 Type 2 diabetes mellitus without complications: Secondary | ICD-10-CM | POA: Diagnosis present

## 2016-10-26 DIAGNOSIS — E1165 Type 2 diabetes mellitus with hyperglycemia: Secondary | ICD-10-CM | POA: Diagnosis present

## 2016-10-26 DIAGNOSIS — Z8249 Family history of ischemic heart disease and other diseases of the circulatory system: Secondary | ICD-10-CM | POA: Diagnosis not present

## 2016-10-26 DIAGNOSIS — I361 Nonrheumatic tricuspid (valve) insufficiency: Secondary | ICD-10-CM | POA: Diagnosis not present

## 2016-10-26 DIAGNOSIS — Z79899 Other long term (current) drug therapy: Secondary | ICD-10-CM

## 2016-10-26 DIAGNOSIS — I214 Non-ST elevation (NSTEMI) myocardial infarction: Secondary | ICD-10-CM

## 2016-10-26 DIAGNOSIS — E669 Obesity, unspecified: Secondary | ICD-10-CM

## 2016-10-26 DIAGNOSIS — I5033 Acute on chronic diastolic (congestive) heart failure: Secondary | ICD-10-CM | POA: Diagnosis not present

## 2016-10-26 DIAGNOSIS — R609 Edema, unspecified: Secondary | ICD-10-CM

## 2016-10-26 DIAGNOSIS — I5023 Acute on chronic systolic (congestive) heart failure: Secondary | ICD-10-CM | POA: Diagnosis present

## 2016-10-26 DIAGNOSIS — Z6841 Body Mass Index (BMI) 40.0 and over, adult: Secondary | ICD-10-CM | POA: Diagnosis present

## 2016-10-26 DIAGNOSIS — I5022 Chronic systolic (congestive) heart failure: Secondary | ICD-10-CM

## 2016-10-26 DIAGNOSIS — I110 Hypertensive heart disease with heart failure: Secondary | ICD-10-CM | POA: Diagnosis not present

## 2016-10-26 DIAGNOSIS — R0602 Shortness of breath: Secondary | ICD-10-CM | POA: Diagnosis not present

## 2016-10-26 HISTORY — DX: Obesity, class 3: E66.813

## 2016-10-26 HISTORY — DX: Morbid (severe) obesity due to excess calories: E66.01

## 2016-10-26 HISTORY — DX: Mild persistent asthma, uncomplicated: J45.30

## 2016-10-26 HISTORY — DX: Essential (primary) hypertension: I10

## 2016-10-26 LAB — COMPREHENSIVE METABOLIC PANEL
ALK PHOS: 70 U/L (ref 38–126)
ALT: 42 U/L (ref 17–63)
AST: 43 U/L — ABNORMAL HIGH (ref 15–41)
Albumin: 3.5 g/dL (ref 3.5–5.0)
Anion gap: 14 (ref 5–15)
BILIRUBIN TOTAL: 1.1 mg/dL (ref 0.3–1.2)
BUN: 8 mg/dL (ref 6–20)
CALCIUM: 9.2 mg/dL (ref 8.9–10.3)
CHLORIDE: 97 mmol/L — AB (ref 101–111)
CO2: 25 mmol/L (ref 22–32)
CREATININE: 0.99 mg/dL (ref 0.61–1.24)
Glucose, Bld: 298 mg/dL — ABNORMAL HIGH (ref 65–99)
Potassium: 3.8 mmol/L (ref 3.5–5.1)
Sodium: 136 mmol/L (ref 135–145)
TOTAL PROTEIN: 7.2 g/dL (ref 6.5–8.1)

## 2016-10-26 LAB — URINALYSIS, ROUTINE W REFLEX MICROSCOPIC
BACTERIA UA: NONE SEEN
Bilirubin Urine: NEGATIVE
GLUCOSE, UA: 150 mg/dL — AB
Hgb urine dipstick: NEGATIVE
KETONES UR: NEGATIVE mg/dL
LEUKOCYTES UA: NEGATIVE
Nitrite: NEGATIVE
PH: 6 (ref 5.0–8.0)
PROTEIN: 30 mg/dL — AB
RBC / HPF: NONE SEEN RBC/hpf (ref 0–5)
SQUAMOUS EPITHELIAL / LPF: NONE SEEN
Specific Gravity, Urine: 1.005 (ref 1.005–1.030)

## 2016-10-26 LAB — GLUCOSE, CAPILLARY: Glucose-Capillary: 391 mg/dL — ABNORMAL HIGH (ref 65–99)

## 2016-10-26 LAB — CBC
HEMATOCRIT: 43.8 % (ref 39.0–52.0)
Hemoglobin: 14.3 g/dL (ref 13.0–17.0)
MCH: 30.2 pg (ref 26.0–34.0)
MCHC: 32.6 g/dL (ref 30.0–36.0)
MCV: 92.6 fL (ref 78.0–100.0)
PLATELETS: 289 10*3/uL (ref 150–400)
RBC: 4.73 MIL/uL (ref 4.22–5.81)
RDW: 13.8 % (ref 11.5–15.5)
WBC: 8.8 10*3/uL (ref 4.0–10.5)

## 2016-10-26 LAB — I-STAT TROPONIN, ED: TROPONIN I, POC: 0.26 ng/mL — AB (ref 0.00–0.08)

## 2016-10-26 LAB — BRAIN NATRIURETIC PEPTIDE: B Natriuretic Peptide: 627.5 pg/mL — ABNORMAL HIGH (ref 0.0–100.0)

## 2016-10-26 LAB — TROPONIN I: TROPONIN I: 0.24 ng/mL — AB (ref <0.03)

## 2016-10-26 LAB — MAGNESIUM: Magnesium: 2 mg/dL (ref 1.7–2.4)

## 2016-10-26 MED ORDER — IRBESARTAN 75 MG PO TABS
75.0000 mg | ORAL_TABLET | Freq: Every day | ORAL | Status: DC
  Filled 2016-10-26: qty 1

## 2016-10-26 MED ORDER — FUROSEMIDE 10 MG/ML IJ SOLN
40.0000 mg | Freq: Two times a day (BID) | INTRAMUSCULAR | Status: DC
  Administered 2016-10-27: 40 mg via INTRAVENOUS
  Filled 2016-10-26: qty 4

## 2016-10-26 MED ORDER — ACETAMINOPHEN 325 MG PO TABS
650.0000 mg | ORAL_TABLET | ORAL | Status: DC | PRN
  Administered 2016-10-27 – 2016-10-28 (×3): 650 mg via ORAL
  Filled 2016-10-26 (×3): qty 2

## 2016-10-26 MED ORDER — ASPIRIN EC 81 MG PO TBEC
81.0000 mg | DELAYED_RELEASE_TABLET | Freq: Every day | ORAL | Status: DC
  Administered 2016-10-27 – 2016-10-29 (×3): 81 mg via ORAL
  Filled 2016-10-26 (×3): qty 1

## 2016-10-26 MED ORDER — FUROSEMIDE 10 MG/ML IJ SOLN
40.0000 mg | Freq: Once | INTRAMUSCULAR | Status: AC
  Administered 2016-10-26: 40 mg via INTRAVENOUS
  Filled 2016-10-26: qty 4

## 2016-10-26 MED ORDER — ENOXAPARIN SODIUM 80 MG/0.8ML ~~LOC~~ SOLN
80.0000 mg | SUBCUTANEOUS | Status: DC
  Administered 2016-10-27 – 2016-10-28 (×2): 80 mg via SUBCUTANEOUS
  Filled 2016-10-26 (×3): qty 0.8

## 2016-10-26 MED ORDER — INSULIN ASPART 100 UNIT/ML ~~LOC~~ SOLN
0.0000 [IU] | Freq: Three times a day (TID) | SUBCUTANEOUS | Status: DC
  Administered 2016-10-27: 11 [IU] via SUBCUTANEOUS
  Administered 2016-10-27 (×2): 15 [IU] via SUBCUTANEOUS
  Administered 2016-10-28 (×2): 5 [IU] via SUBCUTANEOUS
  Administered 2016-10-28: 8 [IU] via SUBCUTANEOUS
  Administered 2016-10-29: 5 [IU] via SUBCUTANEOUS
  Administered 2016-10-29: 3 [IU] via SUBCUTANEOUS

## 2016-10-26 MED ORDER — IPRATROPIUM-ALBUTEROL 0.5-2.5 (3) MG/3ML IN SOLN: 3.0000 mL | RESPIRATORY_TRACT | Status: DC | PRN

## 2016-10-26 MED ORDER — INSULIN ASPART 100 UNIT/ML ~~LOC~~ SOLN
0.0000 [IU] | Freq: Every day | SUBCUTANEOUS | Status: DC
  Administered 2016-10-27: 5 [IU] via SUBCUTANEOUS
  Administered 2016-10-27 – 2016-10-28 (×2): 4 [IU] via SUBCUTANEOUS

## 2016-10-26 MED ORDER — HYDRALAZINE HCL 20 MG/ML IJ SOLN
10.0000 mg | INTRAMUSCULAR | Status: DC | PRN
  Administered 2016-10-27: 10 mg via INTRAVENOUS
  Filled 2016-10-26: qty 1

## 2016-10-26 MED ORDER — ONDANSETRON HCL 4 MG/2ML IJ SOLN: 4.0000 mg | Freq: Four times a day (QID) | INTRAMUSCULAR | Status: DC | PRN

## 2016-10-26 MED ORDER — ASPIRIN EC 325 MG PO TBEC
325.0000 mg | DELAYED_RELEASE_TABLET | Freq: Once | ORAL | Status: AC
  Administered 2016-10-26: 325 mg via ORAL
  Filled 2016-10-26: qty 1

## 2016-10-26 MED ORDER — SODIUM CHLORIDE 0.9% FLUSH: 3.0000 mL | INTRAVENOUS | Status: DC | PRN

## 2016-10-26 MED ORDER — INSULIN ASPART 100 UNIT/ML ~~LOC~~ SOLN
5.0000 [IU] | Freq: Once | SUBCUTANEOUS | Status: AC
  Administered 2016-10-27: 5 [IU] via SUBCUTANEOUS

## 2016-10-26 MED ORDER — SODIUM CHLORIDE 0.9% FLUSH
3.0000 mL | Freq: Two times a day (BID) | INTRAVENOUS | Status: DC
  Administered 2016-10-28: 3 mL via INTRAVENOUS

## 2016-10-26 MED ORDER — SODIUM CHLORIDE 0.9 % IV SOLN: 250.0000 mL | INTRAVENOUS | Status: DC | PRN

## 2016-10-26 NOTE — ED Notes (Signed)
Pt denies any sob at this time.

## 2016-10-26 NOTE — ED Notes (Signed)
I-stat troponin result given to Dr. Kohut 

## 2016-10-26 NOTE — ED Triage Notes (Signed)
Pt here from home with c/o sob and lower leg edema , pt has not been taking his meds due to lack of insurance , pt received 10mg  albuterol .5mg  Atrovent and 125mg  solumedrol

## 2016-10-26 NOTE — H&P (Addendum)
History and Physical    Jonathan [name] ZOX:[id number] DOB: Sep 12, 1985 DOA: 10/26/2016  Referring MD/NP/PA: Cheron Schaumann, PA-C PCP: Patient, No Pcp Per  Patient coming from: Home  Chief Complaint: Shortness of breath  HPI: Jonathan [name] is a 31 y.o. male with medical history significant of  the systolic CHF, HTN, diabetes mellitus type 2, asthma, and morbid obesity; who presents with about 2 weeks of intermittently progressively worsening shortness of breath. He notes symptoms started after he ran out of all of his regular scheduled home medications. Patient issues with this child was there to be a period in time where he was without insurance, but notes that he just recently got his insurance back. He complains of having shortness of breath with exertion, wheezing, orthopnea, abdominal pain, leg swelling, nausea, generalized malaise, diarrhea, constipation, unknown increase in weight, and changing urine to a foamy consistency. Try to utilizing his Ventolin inhaler without relief of symptoms. He states that he was previously living in [place] where the last echocardiogram showed an EF of approximately 15-20% 1 year ago. He also had a cardiac cath during that period in time, but reports that there are no signs of any blockages. Since moving patient notes that he is need of a primary care provider and a cardiologist. Family history is significant for his mother having hypertension and dad having heart and kidney disease at a young age. The patient reports that he never was on insulin for his diabetes because it gave him the shakes. Denies any use of alcohol, tobacco or any illicit drug use. Review of record shows patient was admitted for similar symptoms back in 04/2016, but appears to have left AMA.  ED Course: En route with EMS patient received 10 mg of albuterol, 5 mg Atrovent, and 125 mg Solu-Medrol. Initial vital signs in the emergency department showed temperature 97.5F, pulse 111-118, respirations  22-27, blood pressures 148/113-171/121, O2 saturations 92-99%on RA. Labs revealed glucose 298, no anion gap, BNP 627.5, troponin 0.26. EKG shows sinus tachycardia with RBBB. Chest x-ray shows cardiomegaly with mild interstitial edema. Patient was given 40 mg of Lasix.  Review of Systems  Constitutional: Positive for diaphoresis (night sweats) and malaise/fatigue. Negative for chills, fever and weight loss.  HENT: Negative for ear discharge and nosebleeds.   Eyes: Negative for photophobia and pain.  Respiratory: Positive for shortness of breath and wheezing.   Cardiovascular: Positive for chest pain, orthopnea, leg swelling and PND.  Gastrointestinal: Positive for abdominal pain, constipation, diarrhea and nausea. Negative for vomiting.  Genitourinary: Positive for dysuria and frequency. Negative for flank pain.  Musculoskeletal: Positive for myalgias. Negative for falls.  Skin: Negative for itching and rash.  Neurological: Negative for focal weakness and loss of consciousness.  Endo/Heme/Allergies: Positive for polydipsia.  Psychiatric/Behavioral: Positive for depression. Negative for substance abuse.    Past Medical History:  Diagnosis Date  . Asthma   . CHF (congestive heart failure) (HCC)   . Hypertension   . Obesity     Past Surgical History:  Procedure Laterality Date  . CARDIAC CATHETERIZATION    . FOOT SURGERY     arch surgery     reports that he has never smoked. He has never used smokeless tobacco. He reports that he does not drink alcohol or use drugs.  Allergies  Allergen Reactions  . Hydrocodone     Hives     Family History  Problem Relation Age of Onset  . Diabetes Mellitus II Mother   . Congestive Heart  Failure Neg Hx     Prior to Admission medications   Medication Sig Start Date End Date Taking? Authorizing Provider  albuterol (PROVENTIL HFA;VENTOLIN HFA) 108 (90 BASE) MCG/ACT inhaler Inhale 2 puffs into the lungs every 4 (four) hours as needed for  wheezing. Dispense with aerochamber 01/03/12  Yes Mabe, Onalee Hua, NP  amLODipine (NORVASC) 5 MG tablet Take 5 mg by mouth daily.   Yes [provider]  aspirin EC 81 MG tablet Take 81 mg by mouth daily.   Yes [provider]  Cholecalciferol (VITAMIN D) 2000 units tablet Take 2,000 Units by mouth daily.    Yes [provider]  fluticasone furoate-vilanterol (BREO ELLIPTA) 200-25 MCG/INH AEPB Inhale 1 puff into the lungs daily.   Yes [provider]  furosemide (LASIX) 40 MG tablet Take 40 mg by mouth 2 (two) times daily.   Yes [provider]  ibuprofen (ADVIL,MOTRIN) 800 MG tablet Take 800 mg by mouth every 8 (eight) hours.   Yes [provider]  metolazone (ZAROXOLYN) 2.5 MG tablet Take 2.5 mg by mouth See admin instructions. Mon/Wed/Fri   Yes [provider]  metoprolol tartrate (LOPRESSOR) 100 MG tablet Take 100 mg by mouth 2 (two) times daily.   Yes [provider]  sacubitril-valsartan (ENTRESTO) 49-51 MG Take 1 tablet by mouth 2 (two) times daily.   Yes [provider]  amoxicillin (AMOXIL) 500 MG capsule Take 1 capsule (500 mg total) by mouth 3 (three) times daily. Patient not taking: Reported on 04/19/2016 11/27/12   Janne Napoleon, NP  hydrochlorothiazide (HYDRODIURIL) 25 MG tablet Take 1 tablet (25 mg total) by mouth daily. Patient not taking: Reported on 04/19/2016 01/03/12   Hayden Rasmussen, NP  naproxen (NAPROSYN) 500 MG tablet Take 1 tablet (500 mg total) by mouth 2 (two) times daily. Patient not taking: Reported on 04/19/2016 11/27/12   Janne Napoleon, NP    Physical Exam:  Constitutional:  Morbidly obese male in moderate distress. Vitals:   10/26/16 1845 10/26/16 1900 10/26/16 1930 10/26/16 1944  BP: (!) 150/106 (!) 171/121 (!) 161/118   Pulse: (!) 111 (!) 111 (!) 111   Resp: (!) 22 (!) 24 (!) 25   Temp:      TempSrc:      SpO2: 99% 94% 97% 97%  Weight:      Height:       Eyes: PERRL, lids and  conjunctivae normal ENMT: Mucous membranes are moist. Posterior pharynx clear of any exudate or lesions.  Neck: normal, supple, no masses, no thyromegaly. JVD + Respiratory: Tachypneic with decreased overall aeration on crackles can be appreciated. No appreciated wheezing or rhonchi. Cardiovascular: Regular rate and rhythm, + systolic ejection murmur. No rubs / gallops. +3 pitting bilateral lower extremity edema  2+ pedal pulses. No carotid bruits.  Abdomen: no tenderness, no masses palpated. No hepatosplenomegaly. Bowel sounds positive.  Musculoskeletal: no clubbing / cyanosis. No joint deformity upper and lower extremities. Good ROM, no contractures. Normal muscle tone.  Skin: no rashes, lesions, ulcers. No induration Neurologic: CN 2-12 grossly intact. Sensation intact, DTR normal. Strength 5/5 in all 4.  Psychiatric: Normal judgment and insight. Alert and oriented x 3. Normal mood.     Labs on Admission: I have personally reviewed following labs and imaging studies  CBC:  Recent Labs Lab 10/26/16 1832  WBC 8.8  HGB 14.3  HCT 43.8  MCV 92.6  PLT 289   Basic Metabolic Panel:  Recent Labs Lab 10/26/16 1832  NA 136  K 3.8  CL 97*  CO2 25  GLUCOSE 298*  BUN 8  CREATININE 0.99  CALCIUM 9.2  MG 2.0   GFR: Estimated Creatinine Clearance: 176.8 mL/min (by C-G formula based on SCr of 0.99 mg/dL). Liver Function Tests:  Recent Labs Lab 10/26/16 1832  AST 43*  ALT 42  ALKPHOS 70  BILITOT 1.1  PROT 7.2  ALBUMIN 3.5   No results for input(s): LIPASE, AMYLASE in the last 168 hours. No results for input(s): AMMONIA in the last 168 hours. Coagulation Profile: No results for input(s): INR, PROTIME in the last 168 hours. Cardiac Enzymes: No results for input(s): CKTOTAL, CKMB, CKMBINDEX, TROPONINI in the last 168 hours. BNP (last 3 results) No results for input(s): PROBNP in the last 8760 hours. HbA1C: No results for input(s): HGBA1C in the last 72 hours. CBG: No  results for input(s): GLUCAP in the last 168 hours. Lipid Profile: No results for input(s): CHOL, HDL, LDLCALC, TRIG, CHOLHDL, LDLDIRECT in the last 72 hours. Thyroid Function Tests: No results for input(s): TSH, T4TOTAL, FREET4, T3FREE, THYROIDAB in the last 72 hours. Anemia Panel: No results for input(s): VITAMINB12, FOLATE, FERRITIN, TIBC, IRON, RETICCTPCT in the last 72 hours. Urine analysis: No results found for: COLORURINE, APPEARANCEUR, LABSPEC, PHURINE, GLUCOSEU, HGBUR, BILIRUBINUR, KETONESUR, PROTEINUR, UROBILINOGEN, NITRITE, LEUKOCYTESUR Sepsis Labs: No results found for this or any previous visit (from the past 240 hour(s)).   Radiological Exams on Admission: Dg Chest 2 View  Result Date: 10/26/2016 CLINICAL DATA:  Dyspnea and lower leg edema times 2-3 weeks. EXAM: CHEST  2 VIEW COMPARISON:  04/19/2016 FINDINGS: Stable cardiomegaly. Bilateral interstitial prominence may reflect mild interstitial edema. No effusion or pneumothorax. No acute nor suspicious osseous abnormality. IMPRESSION: Cardiomegaly with mild interstitial edema. Electronically Signed   By: Tollie Eth M.D.   On: 10/26/2016 18:32    EKG: Independently reviewed. Sinus tachycardia with RBBB  Assessment/Plan Systolic congestive heart failure exacerbation: Acute on chronic. Patient with two-week history of worsening shortness of breath exertion. Symptoms secondary to patient running out of all of his home medications. On admission patient in respiratory distress. Physical exam reveals 3+ pitting edema with decreased overall air movement. BNP 627.5.  - Admit to a telemetry bed - Heart failure orders set  initiated  - Continuous pulse oximetry with nasal cannula oxygen as needed to keep O2 saturations >92% - Strict I&Os and daily weights - Elevate lower extremities - Lasix 40 mg IV bid - Reassess in a.m. and adjust diuresis as needed. - Check echocardiogram - Optimize medical management when medically appropriate.  Consider medication changes given lack of insurance. - Message sent to Salley Hews for cardiology to see in a.m.  [facility] [place] [place]   Suspect type II  NSTEMI: Acute. Patient presents with complaints of some chest discomfort for which he was found to have elevated troponin of 0.26 on admission. EKG appears similar to previous. - Trend cardiac troponins  Hypertensive urgency: Acute. Blood pressure is elevated as high as 171/121 on admission. - started Irbesartan  - Hydralazine IV prn sBP>180  Diabetes mellitus type 2, uncontrolled with hyperglycemia: Patient initial blood glucose was elevated up to 298 without elevated anion gap. Patient reports foamy consistency to urine. - Hypoglycemic protocols - Check hemoglobin A1c in a.m. -  Check urinalysis - Diabetic education consult - CBGs every before meals and at bedtime with moderate   Asthma, mild exacerbation: Patient was  initially noted to be wheezing and was given albuterol neb, Atrovent neb, and 125 mg of Solu-Medrol en route with ems. - DuoNeb's prn SOB/Wheezing  Morbid obesity: BMI 46.8 - Counseled on the need of weight loss  OSA: Patient noted to have previously been tested for CPAP machine in [place], but with loss of insurance never received the CPAP machine.  - Will likely need to obtain previous work up or be reevaluated  DVT prophylaxis: Lovenox   Code Status: Full Family Communication: Discussed plan of care with the patient prone present at bedside  Disposition Plan: Likely discharge home in 3-4 days  Consults called:  none Admission status: Inpatient  Clydie Braun MD [facility] Pager [phone]   If 7PM-7AM, please contact night-coverage [email or web address] Password [facility]  10/26/2016, 8:37 PM

## 2016-10-26 NOTE — ED Notes (Signed)
Hospitalist at bedside 

## 2016-10-26 NOTE — ED Provider Notes (Signed)
[facility]-EMERGENCY DEPT Provider Note   CSN: [id number] Arrival date & time: 10/26/16  1713     History   Chief Complaint Chief Complaint  Patient presents with  . Shortness of Breath    HPI Jonathan [name] is a 31 y.o. male.  The history is provided by the patient. No language interpreter was used.  Shortness of Breath  This is a new problem. The problem occurs continuously.The current episode started more than 1 week ago. The problem has been gradually worsening. Associated symptoms include orthopnea, abdominal pain and leg swelling. Pertinent negatives include no fever. He has tried nothing for the symptoms. Associated medical issues include heart failure.  Pt has a history of chf.  Pt reports he has been out of his medication for over a week.  Pt did not have money to fill rx.   Pt reports he feels some better after albuterol treatment.  EMS also gave pt solumedrol.  Pt reports he has had a lot of swelling in his legs.   Past Medical History:  Diagnosis Date  . Asthma   . CHF (congestive heart failure) (HCC)   . Hypertension   . Obesity     Patient Active Problem List   Diagnosis Date Noted  . SIRS (systemic inflammatory response syndrome) (HCC) 04/20/2016  . Acute respiratory failure with hypoxia (HCC) 04/20/2016  . Congestive heart failure (CHF) (HCC) 04/20/2016  . Diabetes mellitus type 2 in obese (HCC) 04/20/2016    Past Surgical History:  Procedure Laterality Date  . CARDIAC CATHETERIZATION    . FOOT SURGERY     arch surgery       Home Medications    Prior to Admission medications   Medication Sig Start Date End Date Taking? Authorizing Provider  albuterol (PROVENTIL HFA;VENTOLIN HFA) 108 (90 BASE) MCG/ACT inhaler Inhale 2 puffs into the lungs every 4 (four) hours as needed for wheezing. Dispense with aerochamber 01/03/12  Yes Mabe, Onalee Hua, NP  amLODipine (NORVASC) 5 MG tablet Take 5 mg by mouth daily.   Yes [provider]  aspirin EC 81 MG tablet  Take 81 mg by mouth daily.   Yes [provider]  Cholecalciferol (VITAMIN D) 2000 units tablet Take 2,000 Units by mouth daily.    Yes [provider]  fluticasone furoate-vilanterol (BREO ELLIPTA) 200-25 MCG/INH AEPB Inhale 1 puff into the lungs daily.   Yes [provider]  furosemide (LASIX) 40 MG tablet Take 40 mg by mouth 2 (two) times daily.   Yes [provider]  ibuprofen (ADVIL,MOTRIN) 800 MG tablet Take 800 mg by mouth every 8 (eight) hours.   Yes [provider]  metolazone (ZAROXOLYN) 2.5 MG tablet Take 2.5 mg by mouth See admin instructions. Mon/Wed/Fri   Yes [provider]  metoprolol tartrate (LOPRESSOR) 100 MG tablet Take 100 mg by mouth 2 (two) times daily.   Yes [provider]  sacubitril-valsartan (ENTRESTO) 49-51 MG Take 1 tablet by mouth 2 (two) times daily.   Yes [provider]  amoxicillin (AMOXIL) 500 MG capsule Take 1 capsule (500 mg total) by mouth 3 (three) times daily. Patient not taking: Reported on 04/19/2016 11/27/12   Janne Napoleon, NP  hydrochlorothiazide (HYDRODIURIL) 25 MG tablet Take 1 tablet (25 mg total) by mouth daily. Patient not taking: Reported on 04/19/2016 01/03/12   Hayden Rasmussen, NP  naproxen (NAPROSYN) 500 MG tablet Take 1 tablet (500 mg total) by mouth 2 (two) times daily. Patient not taking: Reported on 04/19/2016  11/27/12   Janne Napoleon, NP    Family History Family History  Problem Relation Age of Onset  . Diabetes Mellitus II Mother   . Congestive Heart Failure Neg Hx     Social History Social History  Substance Use Topics  . Smoking status: Never Smoker  . Smokeless tobacco: Never Used  . Alcohol use No     Allergies   Hydrocodone   Review of Systems Review of Systems  Constitutional: Negative for fever.  Respiratory: Positive for shortness of breath.   Cardiovascular: Positive for orthopnea and leg swelling.  Gastrointestinal: Positive for abdominal pain.    All other systems reviewed and are negative.    Physical Exam Updated Vital Signs BP (!) 169/126   Pulse (!) 113   Temp (!) 97.3 F (36.3 C) (Oral)   Resp (!) 27   Ht 6\' 2"  (1.88 m)   Wt (!) 165.6 kg (365 lb)   SpO2 94%   BMI 46.86 kg/m   Physical Exam  Constitutional: He appears well-developed and well-nourished.  HENT:  Head: Normocephalic and atraumatic.  Right Ear: External ear normal.  Left Ear: External ear normal.  Eyes: Conjunctivae are normal.  Neck: Neck supple.  Cardiovascular: Normal rate and regular rhythm.   No murmur heard. Pulmonary/Chest: Effort normal. No respiratory distress. He has rales.  Abdominal: Soft. There is no tenderness.  Musculoskeletal: He exhibits edema.  Neurological: He is alert.  Skin: Skin is warm and dry.  Psychiatric: He has a normal mood and affect.  Nursing note and vitals reviewed.    ED Treatments / Results  Labs (all labs ordered are listed, but only abnormal results are displayed) Labs Reviewed  MAGNESIUM  BRAIN NATRIURETIC PEPTIDE  CBC  COMPREHENSIVE METABOLIC PANEL  I-STAT TROPONIN, ED    EKG  EKG Interpretation None       Radiology No results found.  Procedures Procedures (including critical care time)  Medications Ordered in ED Medications  furosemide (LASIX) injection 40 mg (40 mg Intravenous Given 10/26/16 1756)  aspirin EC tablet 325 mg (325 mg Oral Given 10/26/16 1756)     Initial Impression / Assessment and Plan / ED Course  I have reviewed the triage vital signs and the nursing notes.  Pertinent labs & imaging results that were available during my care of the patient were reviewed by me and considered in my medical decision making (see chart for details).       Final Clinical Impressions(s) / ED Diagnoses   Final diagnoses:  Acute congestive heart failure, unspecified heart failure type (HCC)  Edema, unspecified type    New Prescriptions New Prescriptions   No medications on  file   I spoke with hospitalist who will admit for further evaluation.     Osie Cheeks 10/26/16 2041    Raeford Razor, MD 11/05/16 0630

## 2016-10-27 ENCOUNTER — Inpatient Hospital Stay (HOSPITAL_COMMUNITY): Payer: BLUE CROSS/BLUE SHIELD

## 2016-10-27 DIAGNOSIS — R609 Edema, unspecified: Secondary | ICD-10-CM

## 2016-10-27 DIAGNOSIS — I5023 Acute on chronic systolic (congestive) heart failure: Secondary | ICD-10-CM

## 2016-10-27 DIAGNOSIS — I361 Nonrheumatic tricuspid (valve) insufficiency: Secondary | ICD-10-CM

## 2016-10-27 LAB — CBC WITH DIFFERENTIAL/PLATELET
Basophils Absolute: 0 10*3/uL (ref 0.0–0.1)
Basophils Relative: 0 %
EOS ABS: 0 10*3/uL (ref 0.0–0.7)
EOS PCT: 0 %
HCT: 43 % (ref 39.0–52.0)
HEMOGLOBIN: 14.1 g/dL (ref 13.0–17.0)
LYMPHS ABS: 0.9 10*3/uL (ref 0.7–4.0)
LYMPHS PCT: 9 %
MCH: 30.3 pg (ref 26.0–34.0)
MCHC: 32.8 g/dL (ref 30.0–36.0)
MCV: 92.5 fL (ref 78.0–100.0)
MONOS PCT: 1 %
Monocytes Absolute: 0.1 10*3/uL (ref 0.1–1.0)
NEUTROS PCT: 90 %
Neutro Abs: 8.1 10*3/uL — ABNORMAL HIGH (ref 1.7–7.7)
Platelets: 273 10*3/uL (ref 150–400)
RBC: 4.65 MIL/uL (ref 4.22–5.81)
RDW: 13.9 % (ref 11.5–15.5)
WBC: 9 10*3/uL (ref 4.0–10.5)

## 2016-10-27 LAB — BASIC METABOLIC PANEL
Anion gap: 17 — ABNORMAL HIGH (ref 5–15)
BUN: 10 mg/dL (ref 6–20)
CO2: 21 mmol/L — ABNORMAL LOW (ref 22–32)
CREATININE: 1.25 mg/dL — AB (ref 0.61–1.24)
Calcium: 9 mg/dL (ref 8.9–10.3)
Chloride: 97 mmol/L — ABNORMAL LOW (ref 101–111)
GFR calc Af Amer: >60 mL/min (ref >60)
GLUCOSE: 396 mg/dL — AB (ref 65–99)
Potassium: 4.4 mmol/L (ref 3.5–5.1)
SODIUM: 135 mmol/L (ref 135–145)

## 2016-10-27 LAB — GLUCOSE, CAPILLARY
GLUCOSE-CAPILLARY: 384 mg/dL — AB (ref 65–99)
GLUCOSE-CAPILLARY: 316 mg/dL — AB (ref 65–99)
Glucose-Capillary: 356 mg/dL — ABNORMAL HIGH (ref 65–99)

## 2016-10-27 LAB — HIV ANTIBODY (ROUTINE TESTING W REFLEX): HIV SCREEN 4TH GENERATION: NONREACTIVE

## 2016-10-27 LAB — TROPONIN I
TROPONIN I: 0.19 ng/mL — AB (ref <0.03)
Troponin I: 0.15 ng/mL (ref <0.03)

## 2016-10-27 LAB — TSH: TSH: 1.009 u[IU]/mL (ref 0.350–4.500)

## 2016-10-27 LAB — ECHOCARDIOGRAM COMPLETE
Height: 74 in
WEIGHTICAEL: 6076.8 [oz_av]

## 2016-10-27 LAB — LIPID PANEL
CHOL/HDL RATIO: 6.1 ratio
CHOLESTEROL: 135 mg/dL (ref 0–200)
HDL: 22 mg/dL — AB (ref >40)
LDL Cholesterol: 96 mg/dL (ref 0–99)
Triglycerides: 83 mg/dL (ref <150)
VLDL: 17 mg/dL (ref 0–40)

## 2016-10-27 LAB — HEMOGLOBIN A1C
Hgb A1c MFr Bld: 10.8 % — ABNORMAL HIGH (ref 4.8–5.6)
MEAN PLASMA GLUCOSE: 263.26 mg/dL

## 2016-10-27 MED ORDER — CARVEDILOL 3.125 MG PO TABS: 3.1250 mg | ORAL_TABLET | Freq: Two times a day (BID) | ORAL | Status: DC

## 2016-10-27 MED ORDER — SACUBITRIL-VALSARTAN 24-26 MG PO TABS
1.0000 | ORAL_TABLET | Freq: Two times a day (BID) | ORAL | Status: DC
  Administered 2016-10-27 – 2016-10-29 (×5): 1 via ORAL
  Filled 2016-10-27 (×5): qty 1

## 2016-10-27 MED ORDER — INSULIN GLARGINE 100 UNIT/ML ~~LOC~~ SOLN
25.0000 [IU] | Freq: Every day | SUBCUTANEOUS | Status: DC
  Administered 2016-10-27 – 2016-10-29 (×3): 25 [IU] via SUBCUTANEOUS
  Filled 2016-10-27 (×3): qty 0.25

## 2016-10-27 MED ORDER — ISOSORBIDE MONONITRATE ER 30 MG PO TB24
30.0000 mg | ORAL_TABLET | Freq: Every day | ORAL | Status: DC
  Administered 2016-10-27 – 2016-10-29 (×3): 30 mg via ORAL
  Filled 2016-10-27 (×3): qty 1

## 2016-10-27 MED ORDER — CARVEDILOL 6.25 MG PO TABS
6.2500 mg | ORAL_TABLET | Freq: Two times a day (BID) | ORAL | Status: DC
  Administered 2016-10-27: 6.25 mg via ORAL
  Filled 2016-10-27: qty 1

## 2016-10-27 MED ORDER — SPIRONOLACTONE 25 MG PO TABS
12.5000 mg | ORAL_TABLET | Freq: Every day | ORAL | Status: DC
  Administered 2016-10-27 – 2016-10-28 (×2): 12.5 mg via ORAL
  Filled 2016-10-27 (×3): qty 1

## 2016-10-27 MED ORDER — FUROSEMIDE 10 MG/ML IJ SOLN
80.0000 mg | Freq: Two times a day (BID) | INTRAMUSCULAR | Status: DC
  Administered 2016-10-27 – 2016-10-29 (×4): 80 mg via INTRAVENOUS
  Filled 2016-10-27 (×4): qty 8

## 2016-10-27 MED ORDER — ATORVASTATIN CALCIUM 40 MG PO TABS
40.0000 mg | ORAL_TABLET | Freq: Every day | ORAL | Status: DC
  Administered 2016-10-27 – 2016-10-28 (×2): 40 mg via ORAL
  Filled 2016-10-27 (×2): qty 1

## 2016-10-27 MED ORDER — FUROSEMIDE 10 MG/ML IJ SOLN: 40.0000 mg | Freq: Once | INTRAMUSCULAR | Status: DC

## 2016-10-27 NOTE — Clinical Social Work Note (Signed)
CSW acknowledges consults, "Recent issues with lack of insurance although reports now having some form of insurance." and "Patient needs help with acquiring medications and medicaid application." CSW notified RNCM of need for assistance with getting medications. CSW left voicemail for financial counselor regarding screening for Medicaid.  CSW signing off. Consult again if any other social work needs arise.  Charlynn Court, CSW [phone]

## 2016-10-27 NOTE — Evaluation (Addendum)
Physical Therapy Evaluation and Discharge Patient Details Name: Jonathan [name] MRN: [id number] DOB: January 27, 1986 Today's Date: 10/27/2016   History of Present Illness  Jonathan [name] is a 31 y.o. male with medical history significant of  the systolic CHF, HTN, diabetes mellitus type 2, asthma, and morbid obesity; who presents with about 2 weeks of intermittently progressively worsening shortness of breath Pt found to have acute exacerbation of systolic CHF.  Clinical Impression  Patient evaluated by Physical Therapy with no further acute PT needs identified. All education has been completed and the patient has no further questions. Pt is mod I for all mobility and is limited only by 2/4 DoE. Pt educated on need to start a home walking program. See below for any follow-up Physical Therapy or equipment needs. PT is signing off. Thank you for this referral.     Follow Up Recommendations No PT follow up    Equipment Recommendations  None recommended by PT    Recommendations for Other Services       Precautions / Restrictions Precautions Precautions: None Restrictions Weight Bearing Restrictions: No      Mobility  Bed Mobility               General bed mobility comments: seated EoB at entry  Transfers Overall transfer level: Independent                  Ambulation/Gait Ambulation/Gait assistance: Modified independent (Device/Increase time) Ambulation Distance (Feet): 200 Feet Assistive device: None Gait Pattern/deviations: WFL(Within Functional Limits) Gait velocity: slowed Gait velocity interpretation: Below normal speed for age/gender General Gait Details: safe, steady cadence, 2/4 DoE at end of ambulation  Stairs Stairs: Yes Stairs assistance: Modified independent (Device/Increase time) Stair Management: One rail Left;Forwards;Alternating pattern;Step to pattern Number of Stairs: 12 General stair comments: alternating pattern ascent, step to pattern descent,  steady safe ascend/descent of stairs  Wheelchair Mobility    Modified Rankin (Stroke Patients Only)       Balance Overall balance assessment: No apparent balance deficits (not formally assessed)                                           Pertinent Vitals/Pain Pain Assessment: No/denies pain    Home Living Family/patient expects to be discharged to:: Private residence Living Arrangements: Spouse/significant other Available Help at Discharge: Family;Available 24 hours/day Type of Home: House Home Access: Stairs to enter Entrance Stairs-Rails: Left Entrance Stairs-Number of Steps: 8 Home Layout: One level        Prior Function Level of Independence: Independent                  Extremity/Trunk Assessment   Upper Extremity Assessment Upper Extremity Assessment: Overall WFL for tasks assessed    Lower Extremity Assessment Lower Extremity Assessment: Overall WFL for tasks assessed    Cervical / Trunk Assessment Cervical / Trunk Assessment: Normal  Communication   Communication: No difficulties  Cognition Arousal/Alertness: Awake/alert Behavior During Therapy: WFL for tasks assessed/performed Overall Cognitive Status: Within Functional Limits for tasks assessed                                        General Comments General comments (skin integrity, edema, etc.): VSS, SaO2 on RA with ambulation 95%O2     Assessment/Plan  PT Assessment Patent does not need any further PT services         PT Goals (Current goals can be found in the Care Plan section)  Acute Rehab PT Goals Patient Stated Goal: go home tomorrow PT Goal Formulation: With patient     AM-PAC PT "6 Clicks" Daily Activity  Outcome Measure Difficulty turning over in bed (including adjusting bedclothes, sheets and blankets)?: None Difficulty moving from lying on back to sitting on the side of the bed? : None Difficulty sitting down on and standing up  from a chair with arms (e.g., wheelchair, bedside commode, etc,.)?: None Help needed moving to and from a bed to chair (including a wheelchair)?: None Help needed walking in hospital room?: None Help needed climbing 3-5 steps with a railing? : None 6 Click Score: 24    End of Session Equipment Utilized During Treatment: Gait belt Activity Tolerance: Patient tolerated treatment well Patient left: Other (comment);with call bell/phone within reach;with family/visitor present (seated EoB) Nurse Communication: Mobility status      Time: 2025-4270 PT Time Calculation (min) (ACUTE ONLY): 20 min   Charges:   PT Evaluation $PT Eval Low Complexity: 1 Low     PT G Codes:        Cristiana Yochim B. Beverely Risen PT, DPT Acute Rehabilitation  [phone] Pager [phone]    [name] 10/27/2016, 5:40 PM

## 2016-10-27 NOTE — Consult Note (Signed)
Advanced Heart Failure Team Consult Note   Primary Physician: None per patient. Primary Cardiologist:  Not in system.  Reason for Consultation: Systolic CHF  HPI:    Jonathan [name] is seen today for evaluation of acute systolic CHF at the request of Dr. Thedore Mins.   Jonathan [name] is a 31 y.o. male with history of systolic CHF due to NICM, HTN, DM 2, Asthma, OSA, and morbid obesity (Body mass index is 48.76 kg/m.)  Pt presented to [facility] 10/26/16 with progressive SOB x 2 weeks. He reports h/o systolic CHF with EF 15-20% by Echo in [place] x 1 year ago. Cath around that time without any significant CAD per patient. Recently went for a long period without any insurance, but recently re-obtained through his wifes work. He was brought to ED via EMS and received Duoneb + solumedrol + 40 MG IV lasix.  Pertinent labs on admission include BNP 627, Creatinine 0.99, K 3.8, WBC 8.8, and Hgb 14.3. Troponin 0.26 on admit and trending down.  CXR with cardiomegaly and mild interstitial edema.   He states he is already feeling better. He states 3-4 weeks ago he was USOH with no SOB on flat ground. Did have baseline SOB with stairs or inclines. Over past 2-3 weeks (since running out of his medications), he has felt gradually worse with peripheral edema, SOB with mild exertion (walking to bathroom), and orthopnea. At baseline sleeps flat on a mattress with no pillows. Denies fevers, chills, N/V, or diarrhea.   He states he has had heart problems for a "long time" but doesn't follow up regularly.  He states he was referred for transplant consideration in [place], but they "said no". He cannot elaborate further. He thinks he has an aunt who passed away at < 80 yo from cardiac arrest. Mother has HTN, Father has Heart disease.  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 He has not followed up with an MD of any sort for nearly a year.    Review of Systems: [y] = yes,  = no   General:  Weight gain [y]; Weight loss ; Anorexia ; Fatigue [y]; Fever ; Chills ; Weakness   Cardiac: Chest pain/pressure [y]; Resting SOB ; Exertional SOB [y]; Orthopnea [y]; Pedal Edema [y]; Palpitations ; Syncope ; Presyncope ; Paroxysmal nocturnal dyspnea[ ]  Pulmonary: Cough ; Wheezing[ ] ; Hemoptysis[ ] ; Sputum ; Snoring  GI: Vomiting[ ] ; Dysphagia[ ] ; Melena[ ] ; Hematochezia ; Heartburn[ ] ; Abdominal pain ; Constipation ; Diarrhea ; BRBPR  GU: Hematuria[ ] ; Dysuria ; Nocturia[ ]  Vascular: Pain in legs with walking ; Pain in feet with lying flat ; Non-healing sores ; Stroke ; TIA ; Slurred speech ; Neuro: Headaches[ ] ; Vertigo[ ] ; Seizures[ ] ; Paresthesias[ ] ;Blurred vision ; Diplopia ; Vision changes  Ortho/Skin: Arthritis ; Joint pain ; Muscle pain ; Joint swelling ; Back Pain ; Rash   Psych: Depression[ ] ; Anxiety[y] Heme: Bleeding problems ; Clotting disorders ; Anemia   Endocrine: Diabetes [y]; Thyroid dysfunction[ ]    Home Medications Prior to Admission medications   Medication Sig Start Date End Date Taking? Authorizing Provider  albuterol (PROVENTIL HFA;VENTOLIN HFA) 108 (90 BASE) MCG/ACT inhaler Inhale 2 puffs into the lungs every 4 (four) hours as needed  for wheezing. Dispense with aerochamber 01/03/12  Yes Mabe, Onalee Hua, NP  amLODipine (NORVASC) 5 MG tablet Take 5 mg by mouth daily.   Yes [provider]  aspirin EC 81 MG tablet Take 81 mg by mouth daily.   Yes [provider]  Cholecalciferol (VITAMIN D) 2000 units tablet Take 2,000 Units by mouth daily.    Yes [provider]  fluticasone furoate-vilanterol (BREO ELLIPTA) 200-25 MCG/INH AEPB Inhale 1 puff into the lungs daily.   Yes [provider]  furosemide (LASIX) 40 MG tablet Take 40 mg by mouth 2 (two) times daily.   Yes [provider]  ibuprofen (ADVIL,MOTRIN) 800 MG tablet Take 800  mg by mouth every 8 (eight) hours.   Yes [provider]  metolazone (ZAROXOLYN) 2.5 MG tablet Take 2.5 mg by mouth See admin instructions. Mon/Wed/Fri   Yes [provider]  metoprolol tartrate (LOPRESSOR) 100 MG tablet Take 100 mg by mouth 2 (two) times daily.   Yes [provider]  sacubitril-valsartan (ENTRESTO) 49-51 MG Take 1 tablet by mouth 2 (two) times daily.   Yes [provider]  amoxicillin (AMOXIL) 500 MG capsule Take 1 capsule (500 mg total) by mouth 3 (three) times daily. Patient not taking: Reported on 04/19/2016 11/27/12   Janne Napoleon, NP  hydrochlorothiazide (HYDRODIURIL) 25 MG tablet Take 1 tablet (25 mg total) by mouth daily. Patient not taking: Reported on 04/19/2016 01/03/12   Hayden Rasmussen, NP  naproxen (NAPROSYN) 500 MG tablet Take 1 tablet (500 mg total) by mouth 2 (two) times daily. Patient not taking: Reported on 04/19/2016 11/27/12   Janne Napoleon, NP    Past Medical History: Past Medical History:  Diagnosis Date  . Asthma   . CHF (congestive heart failure) (HCC)   . Hypertension   . Obesity     Past Surgical History: Past Surgical History:  Procedure Laterality Date  . CARDIAC CATHETERIZATION    . FOOT SURGERY     arch surgery    Family History: Family History  Problem Relation Age of Onset  . Diabetes Mellitus II Mother   . Congestive Heart Failure Neg Hx     Social History: Social History   Social History  . Marital status: Single    Spouse name: N/A  . Number of children: N/A  . Years of education: N/A   Social History Main Topics  . Smoking status: Never Smoker  . Smokeless tobacco: Never Used  . Alcohol use No  . Drug use: No  . Sexual activity: No   Other Topics Concern  . None   Social History Narrative  . None   Allergies:  Allergies  Allergen Reactions  . Hydrocodone     Hives    Objective:    Vital Signs:   Temp:  [97.3 F (36.3 C)-98 F (36.7 C)] 98 F (36.7 C) (09/12  0524) Pulse Rate:  [103-118] 103 (09/12 0524) Resp:  [22-29] 23 (09/11 2130) BP: (147-174)/(88-128) 158/88 (09/12 0524) SpO2:  [92 %-99 %] 94 % (09/12 0524) Weight:  [365 lb (165.6 kg)-380 lb 8 oz (172.6 kg)] 379 lb 12.8 oz (172.3 kg) (09/12 0524) Last BM Date:  (unkniwn)  Weight change: Filed Weights   10/26/16 1756 10/26/16 2305 10/27/16 0524  Weight: (!) 365 lb (165.6 kg) (!) 380 lb 8 oz (172.6 kg) (!) 379 lb 12.8 oz (172.3 kg)   Intake/Output:   Intake/Output Summary (Last 24 hours) at 10/27/16 1049 Last data filed at  10/27/16 0929  Gross per 24 hour  Intake              960 ml  Output             1500 ml  Net             -540 ml   Physical Exam    General:  Well appearing. No resp difficulty HEENT: normal Neck: supple. JVP . Carotids 2+ bilat; no bruits. No lymphadenopathy or thyromegaly appreciated. Cor: PMI nondisplaced. Regular rate & rhythm. No rubs, gallops or murmurs. Lungs: clear Abdomen: soft, nontender, nondistended. No hepatosplenomegaly. No bruits or masses. Good bowel sounds. Extremities: no cyanosis, clubbing, rash, edema Neuro: alert & orientedx3, cranial nerves grossly intact. moves all 4 extremities w/o difficulty. Affect pleasant   Telemetry   Sinus tach 100-110s, Personally reviewed.  EKG    Sinus tach on admit, personally reviewed.   Labs   Basic Metabolic Panel:  Recent Labs Lab 10/26/16 1832 10/27/16 0221  NA 136 135  K 3.8 4.4  CL 97* 97*  CO2 25 21*  GLUCOSE 298* 396*  BUN 8 10  CREATININE 0.99 1.25*  CALCIUM 9.2 9.0  MG 2.0  --    Liver Function Tests:  Recent Labs Lab 10/26/16 1832  AST 43*  ALT 42  ALKPHOS 70  BILITOT 1.1  PROT 7.2  ALBUMIN 3.5   No results for input(s): LIPASE, AMYLASE in the last 168 hours. No results for input(s): AMMONIA in the last 168 hours.  CBC:  Recent Labs Lab 10/26/16 1832 10/27/16 0221  WBC 8.8 9.0  NEUTROABS  --  8.1*  HGB 14.3 14.1  HCT 43.8 43.0  MCV 92.6 92.5  PLT 289  273   Cardiac Enzymes:  Recent Labs Lab 10/26/16 2108 10/27/16 0221 10/27/16 0739  TROPONINI 0.24* 0.19* 0.15*   BNP: BNP (last 3 results)  Recent Labs  04/19/16 2005 10/26/16 1832  BNP 409.4* 627.5*   ProBNP (last 3 results) No results for input(s): PROBNP in the last 8760 hours.  CBG:  Recent Labs Lab 10/26/16 2310 10/27/16 0806  GLUCAP 391* 384*    Coagulation Studies: No results for input(s): LABPROT, INR in the last 72 hours.   Imaging   Dg Chest 2 View  Result Date: 10/26/2016 CLINICAL DATA:  Dyspnea and lower leg edema times 2-3 weeks. EXAM: CHEST  2 VIEW COMPARISON:  04/19/2016 FINDINGS: Stable cardiomegaly. Bilateral interstitial prominence may reflect mild interstitial edema. No effusion or pneumothorax. No acute nor suspicious osseous abnormality. IMPRESSION: Cardiomegaly with mild interstitial edema. Electronically Signed   By: Tollie Eth M.D.   On: 10/26/2016 18:32      Medications:     Current Medications: . aspirin EC  81 mg Oral Daily  . carvedilol  6.25 mg Oral BID WC  . enoxaparin (LOVENOX) injection  80 mg Subcutaneous Q24H  . furosemide  40 mg Intravenous BID  . insulin aspart  0-15 Units Subcutaneous TID WC  . insulin aspart  0-5 Units Subcutaneous QHS  . irbesartan  75 mg Oral Daily  . isosorbide mononitrate  30 mg Oral Daily  . sodium chloride flush  3 mL Intravenous Q12H     Infusions: . sodium chloride         Patient Profile   Jonathan [name] is a 31 y.o. male with h/o of systolic CHF, NICM, HTN, DM2, OSA, Asthma, and morbid obesity.  Admitted with worsening SOB and volume overload.   Assessment/Plan  1. Acute on chronic systolic CHF due to NICM - Reports EF 15-20% in [place] last year. Repeat Echo pending.  - NYHA III-IIIb symptoms. Reports NYHA class II symptoms at basline. - Volume status elevated on exam. Remains tachycardic.  - Give extra 40 mg IV lasix now and increase to 80 mg BID.  - Continue coreg 6.25 mg  BID for now. Low threshold to decrease with acute decompensation. He has improved since admit on this dose, so will continue for now.  - Stop irbesartan. Transition back to Entresto at 24/26 mg BID.  - Add spiro 12.5 mg daily.  - May need to consider corlanor.  - Place ted hose.  - Could consider dig but with baseline symptoms NYHA II, will hold off for now.  - Echo pending. Increased priority.  - Orders have been placed to obtain medical records from [facility] center in [place], [place] - Reinforced fluid restriction to < 2 L daily, sodium restriction to less than 2000 mg daily, and the importance of daily weights.   - Will ask HF navigator to see.  2. Chest pressure  - Suspect mild troponin elevated is 2/2 demand ischemia with volume overload.  3. Hypertensive urgency on admit - Will adjust meds in setting of treating his HF. Meds as above.  4. DM2  - Per primary - Hgb A1c is 10.8. Needs close follow up and med adjustment. Needs insulin.  5. Asthma - Per primary. Continue home meds and nebs prn.  6. Morbid Obesity - Needs weight loss.  7. OSA/OHS - Previously diagnosis with OSA, but no CPAP obtained with loss/lack of insurance. Now has insurance. Will need repeat sleep study as outpatient.   Length of Stay: 1  Luane School  10/27/2016, 10:49 AM  Advanced Heart Failure Team Pager [phone] (M-F; 7a - 4p)  Please contact [facility] for night-coverage after hours (4p -7a ) and weekends on [email or web address]  Patient seen and examined with the above-signed Advanced Practice Provider and/or Housestaff. I personally reviewed laboratory data, imaging studies and relevant notes. I independently examined the patient and formulated the important aspects of the plan. I have edited the note to reflect any of my changes or salient points. I have personally discussed the plan with the patient and/or family.  31 y/o with severe NICM admitted with worsening HF  symptoms in setting of running out of his meds. Will obtain echo. Continue IV diuresis. Will need echo. Resume Entresto and spiro. Continue coreg 6.25 bid as he does not appear low output.   Arvilla Meres, MD  4:05 PM

## 2016-10-27 NOTE — Progress Notes (Signed)
Nutrition Education Note  RD consulted for nutrition education regarding new onset CHF and diabetes.  Spoke with pt at bedside. Pt reports not taking his diabetes medication 3 months prior to admission related to relocating from [place] and running out of medication. Pt had prior education on eating for diabetes and CHF. States it is his own fault for "getting this bad". Admits to being depressed over the last 3 months. Does not have any trouble getting food. Case management involved for PT/OT and aware about insurance changes. RD reviewed each diet education, and answered all questions.   RD provided "Low Sodium Nutrition Therapy" handout from the Academy of Nutrition and Dietetics. Reviewed patient's dietary recall. Provided examples on ways to decrease sodium intake in diet. Discouraged intake of processed foods and use of salt shaker. Encouraged fresh fruits and vegetables as well as whole grain sources of carbohydrates to maximize fiber intake. RD discussed why it is important for patient to adhere to diet recommendations, and emphasized the role of fluids, foods to avoid, and importance of weighing self daily.   RD provided "Carbohydrate Counting for People with Diabetes" handout from the Academy of Nutrition and Dietetics. Discussed different food groups and their effects on blood sugar, emphasizing carbohydrate-containing foods. Provided list of carbohydrates and recommended serving sizes of common foods.Discussed importance of controlled and consistent carbohydrate intake throughout the day. Provided examples of ways to balance meals/snacks and encouraged intake of high-fiber, whole grain complex carbohydrates. Teach back method used.  Expect fair compliance.  Body mass index is 48.76 kg/m. Pt meets criteria for morbid obesity based on current BMI.  Current diet order is heart healthy/carb modified, patient is consuming approximately 100% of meals at this time. Labs and medications  reviewed. No further nutrition interventions warranted at this time. RD contact information provided. If additional nutrition issues arise, please re-consult RD.   Vanessa Kick RD, LDN Clinical Nutrition Pager # [phone]

## 2016-10-27 NOTE — Progress Notes (Addendum)
Pt is alert and oriented with wife at beside Fluid over load, ran out of Home medications x 2 months, new IV placed missed extra Lasix today stable plan Continue to diurese and  New scripts for medication upon discharge.

## 2016-10-27 NOTE — Progress Notes (Signed)
Inpatient Diabetes Program Recommendations  AACE/ADA: New Consensus Statement on Inpatient Glycemic Control (2015)  Target Ranges:  Prepandial:   less than 140 mg/dL      Peak postprandial:   less than 180 mg/dL (1-2 hours)      Critically ill patients:  140 - 180 mg/dL   Lab Results  Component Value Date   GLUCAP 384 (H) 10/27/2016   HGBA1C 10.8 (H) 10/27/2016    Review of Glycemic Control  Diabetes history: DM2 Outpatient Diabetes medications: None Current orders for Inpatient glycemic control: Lantus 25 units QD, Novolog 0-15 units tidwc and hs  Inpatient Diabetes Program Recommendations:    Add Novolog 6 units tidwc for meal coverage insulin. Will order Living Well With Diabetes book and diabetes videos.  Will speak with pt in am regarding diabetes control.  Thank you. Ailene Ards, RD, LDN, CDE Inpatient Diabetes Coordinator [phone]

## 2016-10-27 NOTE — Progress Notes (Addendum)
@IPLOG @        PROGRESS NOTE                                                                                                                                                                                                             Jonathan [name] Demographics:    Jonathan [name], is a 31 y.o. male, DOB - [birth year], IFB:[id number]  Admit date - 10/26/2016   Admitting Physician Clydie Braun, MD  Outpatient Primary MD for the Jonathan [name] is Jonathan [name], No Pcp Per  LOS - 1  Chief Complaint  Jonathan [name] presents with  . Shortness of Breath       Brief Narrative  Jonathan [name] is a 31 y.o. male who recently moved from [place] and stopped taking his medications a few weeks ago with medical history significant of  the systolic CHF, HTN, diabetes mellitus type 2, asthma, and morbid obesity; who presents with about 2 weeks of intermittently progressively worsening shortness of breath.   Subjective:    Jonathan [name] today has, No headache, No chest pain, No abdominal pain - No Nausea, No new weakness tingling or numbness, No Cough - improved SOB.    Assessment  & Plan :     1. Acute on chronic systolic heart failure with last known EF of around 20% per Jonathan [name] which was diagnosed in [place]. Due to noncompliance with medications and poorly controlled blood pressure, currently improving on combination of diuretics which include Lasix, Aldactone along with Coreg, Entresto and Imdur. Echo pending cardiology following. Continue salt and fluid restriction monitor BMP and weight.  2. Hypertension. In poor control, medications adjusted blood pressure better will continue to monitor and adjust.  3. Morbid obesity with obstructive sleep apnea. Follow with PCP for weight loss, CPAP daily at bedtime.  4. Mildly elevated troponin and non-ACS pattern. Chest pain-free. Likely due to demand ischemia from #1 above. Currently on aspirin, beta blocker, will add low-dose statin for risk factor stratification, cardiology  following. Obtain echocardiogram.  5. DM type II. Currently on sliding-scale will add Lantus for better controlled and will monitor.  CBG (last 3)   Recent Labs  10/26/16 2310 10/27/16 0806  GLUCAP 391* 384*      Diet : Diet heart healthy/carb modified Room service appropriate? Yes; Fluid consistency: Thin; Fluid restriction: 1500 mL Fluid    Family Communication  : None  Code Status :  Full  Disposition Plan  :  TBD  Consults  :  Cards  Procedures  :  TTE  DVT Prophylaxis  :  Lovenox    Lab Results  Component Value Date   PLT 273 10/27/2016    Inpatient Medications  Scheduled Meds: . aspirin EC  81 mg Oral Daily  . carvedilol  6.25 mg Oral BID WC  . enoxaparin (LOVENOX) injection  80 mg Subcutaneous Q24H  . furosemide  40 mg Intravenous Once  . furosemide  80 mg Intravenous BID  . insulin aspart  0-15 Units Subcutaneous TID WC  . insulin aspart  0-5 Units Subcutaneous QHS  . isosorbide mononitrate  30 mg Oral Daily  . sacubitril-valsartan  1 tablet Oral BID  . sodium chloride flush  3 mL Intravenous Q12H  . spironolactone  12.5 mg Oral Daily   Continuous Infusions: . sodium chloride     PRN Meds:.sodium chloride, acetaminophen, hydrALAZINE, ipratropium-albuterol, ondansetron (ZOFRAN) IV, sodium chloride flush  Antibiotics  :    Anti-infectives    None         Objective:   Vitals:   10/26/16 2305 10/27/16 0146 10/27/16 0524 10/27/16 1000  BP: (!) 174/128 (!) 148/90 (!) 158/88 (!) 149/98  Pulse: (!) 114  (!) 103 (!) 108  Resp:    20  Temp: 97.7 F (36.5 C)  98 F (36.7 C) 98.3 F (36.8 C)  TempSrc: Oral  Oral Oral  SpO2: 96%  94% 97%  Weight: (!) 172.6 kg (380 lb 8 oz)  (!) 172.3 kg (379 lb 12.8 oz)   Height:  (1.88 m)       Wt Readings from Last 3 Encounters:  10/27/16 (!) 172.3 kg (379 lb 12.8 oz)  11/27/12 (!) 146.5 kg (323 lb)     Intake/Output Summary (Last 24 hours) at 10/27/16 1146 Last data filed at 10/27/16 0929   Gross per 24 hour  Intake              960 ml  Output             1500 ml  Net             -540 ml     Physical Exam  Awake Alert, Oriented X 3, No new F.N deficits, Normal affect [place].AT,PERRAL Supple Neck,No JVD, No cervical lymphadenopathy appriciated.  Symmetrical Chest wall movement, Good air movement bilaterally, +ve rales RRR,No Gallops,Rubs or new Murmurs, No Parasternal Heave +ve B.Sounds, Abd Soft, No tenderness, No organomegaly appriciated, No rebound - guarding or rigidity. No Cyanosis, Clubbing, 1+ edema, No new Rash or bruise     Data Review:    CBC  Recent Labs Lab 10/26/16 1832 10/27/16 0221  WBC 8.8 9.0  HGB 14.3 14.1  HCT 43.8 43.0  PLT 289 273  MCV 92.6 92.5  MCH 30.2 30.3  MCHC 32.6 32.8  RDW 13.8 13.9  LYMPHSABS  --  0.9  MONOABS  --  0.1  EOSABS  --  0.0  BASOSABS  --  0.0    Chemistries   Recent Labs Lab 10/26/16 1832 10/27/16 0221  NA 136 135  K 3.8 4.4  CL 97* 97*  CO2 25 21*  GLUCOSE 298* 396*  BUN 8 10  CREATININE 0.99 1.25*  CALCIUM 9.2 9.0  MG 2.0  --   AST 43*  --   ALT 42  --   ALKPHOS 70  --   BILITOT 1.1  --    ------------------------------------------------------------------------------------------------------------------  Recent Labs  10/27/16 0221  CHOL 135  HDL 22*  LDLCALC 96  TRIG  83  CHOLHDL 6.1    Lab Results  Component Value Date   HGBA1C 10.8 (H) 10/27/2016   ------------------------------------------------------------------------------------------------------------------  Recent Labs  10/27/16 0221  TSH 1.009   ------------------------------------------------------------------------------------------------------------------ No results for input(s): VITAMINB12, FOLATE, FERRITIN, TIBC, IRON, RETICCTPCT in the last 72 hours.  Coagulation profile No results for input(s): INR, PROTIME in the last 168 hours.  No results for input(s): DDIMER in the last 72 hours.  Cardiac Enzymes  Recent  Labs Lab 10/26/16 2108 10/27/16 0221 10/27/16 0739  TROPONINI 0.24* 0.19* 0.15*   ------------------------------------------------------------------------------------------------------------------    Component Value Date/Time   BNP 627.5 (H) 10/26/2016 1832    Micro Results No results found for this or any previous visit (from the past 240 hour(s)).  Radiology Reports Dg Chest 2 View  Result Date: 10/26/2016 CLINICAL DATA:  Dyspnea and lower leg edema times 2-3 weeks. EXAM: CHEST  2 VIEW COMPARISON:  04/19/2016 FINDINGS: Stable cardiomegaly. Bilateral interstitial prominence may reflect mild interstitial edema. No effusion or pneumothorax. No acute nor suspicious osseous abnormality. IMPRESSION: Cardiomegaly with mild interstitial edema. Electronically Signed   By: Tollie Eth M.D.   On: 10/26/2016 18:32    Time Spent in minutes  30   Susa Raring M.D on 10/27/2016 at 11:46 AM  Between 7am to 7pm - Pager - [phone] ( page via [email or web address], text pages only, please mention full 10 digit call back number). After 7pm go to [email or web address] - password [email or web address]

## 2016-10-27 NOTE — Care Management Note (Signed)
Case Management Note  Patient Details  Name: Jonathan [name] MRN: [id number] Date of Birth: 12-30-1985  Subjective/Objective:      CHF             Action/Plan: Patient lives at home with his spouse; has private insurance with [facility] with prescription drug coverage (spouse stated that they just recently got insurance through her job at [employer]); Cardiologist Dr Mercy Riding [phone]. Patient wants to leave the hospital to take care of his grandfather in [place] due to the [place] that is approaching. Lots of emotional support given. Patient stated " I have been hearing this all of my life cause I was born with a hole in my heart." CM will continue to follow for DCP   Expected Discharge Date:  10/29/16               Expected Discharge Plan:  Home/Self Care  In-House Referral:  Clinical Social Work  Discharge planning Services  CM Consult  Status of Service:  In process, will continue to follow  Reola Mosher [phone] 10/27/2016, 10:21 AM

## 2016-10-27 NOTE — Progress Notes (Signed)
  Echocardiogram 2D Echocardiogram has been performed.  Farrell Pantaleo T Vadie Principato 10/27/2016, 2:21 PM

## 2016-10-28 DIAGNOSIS — I5033 Acute on chronic diastolic (congestive) heart failure: Secondary | ICD-10-CM

## 2016-10-28 DIAGNOSIS — I5022 Chronic systolic (congestive) heart failure: Secondary | ICD-10-CM

## 2016-10-28 LAB — GLUCOSE, CAPILLARY
GLUCOSE-CAPILLARY: 260 mg/dL — AB (ref 65–99)
GLUCOSE-CAPILLARY: 301 mg/dL — AB (ref 65–99)
Glucose-Capillary: 254 mg/dL — ABNORMAL HIGH (ref 65–99)
Glucose-Capillary: 237 mg/dL — ABNORMAL HIGH (ref 65–99)

## 2016-10-28 LAB — BASIC METABOLIC PANEL
Anion gap: 9 (ref 5–15)
BUN: 21 mg/dL — ABNORMAL HIGH (ref 6–20)
CHLORIDE: 97 mmol/L — AB (ref 101–111)
CO2: 29 mmol/L (ref 22–32)
Calcium: 8.9 mg/dL (ref 8.9–10.3)
Creatinine, Ser: 1.03 mg/dL (ref 0.61–1.24)
GFR calc Af Amer: >60 mL/min (ref >60)
GLUCOSE: 265 mg/dL — AB (ref 65–99)
POTASSIUM: 4.2 mmol/L (ref 3.5–5.1)
Sodium: 135 mmol/L (ref 135–145)

## 2016-10-28 LAB — MAGNESIUM: Magnesium: 2.2 mg/dL (ref 1.7–2.4)

## 2016-10-28 MED ORDER — CARVEDILOL 6.25 MG PO TABS
6.2500 mg | ORAL_TABLET | Freq: Two times a day (BID) | ORAL | Status: DC
  Administered 2016-10-28 – 2016-10-29 (×3): 6.25 mg via ORAL
  Filled 2016-10-28 (×3): qty 1

## 2016-10-28 MED ORDER — METOLAZONE 5 MG PO TABS
5.0000 mg | ORAL_TABLET | ORAL | Status: AC
  Administered 2016-10-28: 5 mg via ORAL
  Filled 2016-10-28: qty 1

## 2016-10-28 MED ORDER — OXYCODONE HCL 5 MG PO TABS
5.0000 mg | ORAL_TABLET | Freq: Once | ORAL | Status: AC
  Administered 2016-10-29: 5 mg via ORAL
  Filled 2016-10-28: qty 1

## 2016-10-28 NOTE — Progress Notes (Signed)
Heart Failure Navigator Consult Note  Presentation: Per Dr. Signa Kell Minniear is a 31 y.o. male with history of systolic CHF due to NICM, HTN, DM 2, Asthma, OSA, and morbid obesity (Body mass index is 48.76 kg/m.)  Pt presented to [facility] 10/26/16 with progressive SOB x 2 weeks. He reports h/o systolic CHF with EF 15-20% by Echo in [place] x 1 year ago. Cath around that time without any significant CAD per patient. Recently went for a long period without any insurance, but recently re-obtained through his wifes work. He was brought to ED via EMS and received Duoneb + solumedrol + 40 MG IV lasix.  Pertinent labs on admission include BNP 627, Creatinine 0.99, K 3.8, WBC 8.8, and Hgb 14.3. Troponin 0.26 on admit and trending down.  CXR with cardiomegaly and mild interstitial edema.   He states he is already feeling better. He states 3-4 weeks ago he was USOH with no SOB on flat ground. Did have baseline SOB with stairs or inclines. Over past 2-3 weeks (since running out of his medications), he has felt gradually worse with peripheral edema, SOB with mild exertion (walking to bathroom), and orthopnea. At baseline sleeps flat on a mattress with no pillows. Denies fevers, chills, N/V, or diarrhea.   He states he has had heart problems for a "long time" but doesn't follow up regularly.  He states he was referred for transplant consideration in [place], but they "said no". He cannot elaborate further. He thinks he has an aunt who passed away at < 33 yo from cardiac arrest. Mother has HTN, Father has Heart disease.  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 He has not followed up with an MD of any sort for nearly a year.    Past Medical History:  Diagnosis Date  . Asthma   . CHF (congestive heart failure) (HCC)   . Hypertension   . Obesity     Social History   Social History  . Marital status: Single    Spouse name: N/A  . Number of children: N/A  . Years of  education: N/A   Social History Main Topics  . Smoking status: Never Smoker  . Smokeless tobacco: Never Used  . Alcohol use No  . Drug use: No  . Sexual activity: No   Other Topics Concern  . None   Social History Narrative  . None    ECHO: Study Conclusions-10/27/16  - Left ventricle: The cavity size was normal. There was moderate   concentric hypertrophy. Systolic function was moderately to   severely reduced. The estimated ejection fraction was in the   range of 30% to 35%. Diffuse hypokinesis. The study is not   technically sufficient to allow evaluation of LV diastolic   function. - Left atrium: The atrium was mildly dilated. - Tricuspid valve: There was moderate regurgitation. - Pulmonary arteries: Systolic pressure was mildly increased. PA   peak pressure: 31 mm Hg (S).  ------------------------------------------------------------------- Study data:  No prior study was available for comparison.  Study status:  Routine.  Procedure:  Transthoracic echocardiography. Image quality was fair. The study was technically difficult, as a result of poor acoustic windows, poor sound wave transmission, and poor patient compliance.  Study completion:  There were no complications.          Transthoracic echocardiography.  M-mode, complete 2D, spectral Doppler, and color Doppler.  Birthdate: Patient birthdate: 12-19-1985.  Age:  Patient is 31 yr  old.  Sex: Gender: male.    BMI: 48.7 kg/m^2.  Blood pressure:     135/95 Patient status:  Inpatient.  Study date:  Study date: 10/27/2016. Study time: 01:44 PM.  Location:  Bedside.   BNP    Component Value Date/Time   BNP 627.5 (H) 10/26/2016 1832    ProBNP No results found for: PROBNP   Education Assessment and Provision:  Detailed education and instructions provided on heart failure disease management including the following:  Signs and symptoms of Heart Failure When to call the physician Importance of daily  weights Low sodium diet Fluid restriction Medication management Anticipated future follow-up appointments  Patient education given on each of the above topics.  Patient acknowledges understanding and acceptance of all instructions.  I spoke with Mr. Jolley regarding his HF and current hospitalization.  He tells me that he has been educated before regarding HF recommendations for home.  He does not have a scale and I have supplied one for home use.  We discussed the importance of daily weights and when to contact the physician.  We briefly discussed a low sodium diet and high sodium foods to avoid.  He does say that he has been depressed and not eating "right".  He admits that he was not taking medications prior to admission secondary to having no established physician and inability to get prescriptions.  He also admits that he has not had insurance and just recently got insurance through his wife who now works for [employer].   He plans to follow in the [facility] and I have provided a map with directions to get to the clinic.  Education Materials:  "Living Better With Heart Failure" Booklet, Daily Weight Tracker Tool   High Risk Criteria for Readmission and/or Poor Patient Outcomes:  (Recommend Follow-up with Advanced Heart Failure Clinic)--yes    EF <30%- 30-35%  2 or more admissions in 6 months-1/ 27mo  Difficult social situation- Yes recently relocated- needs PCP  Demonstrates medication noncompliance-yes did not have prescriptions and stopped medications prior to admission.   Barriers of Care:  Knowledge and compliance  Discharge Planning:  Plans to return to home with wife in [place].  He will benefit from HF [employer] and I will make referral.   I will also make referral to HF SW for PCP assistance.

## 2016-10-28 NOTE — Progress Notes (Signed)
Advanced Heart Failure Rounding Note  PCP: New to [place] Primary Cardiologist: New to [place]  Subjective:    Feeling better. He is getting anxious to get home with the pending storm (Lives with wife and dog). Orthopnea improved. Edema improving but still in place.   Negative 1.7 L and down 2 lbs.  Echo 10/27/16 LVEF 30-35%, Diffuse HK, Mild LAE, mod TR, PA peak pressure 31 mm Hg.  Objective:   Weight Range: (!) 378 lb 9.6 oz (171.7 kg) Body mass index is 48.61 kg/m.   Vital Signs:   Temp:  [97.8 F (36.6 C)-98.3 F (36.8 C)] 97.8 F (36.6 C) (09/13 0523) Pulse Rate:  [98-112] 112 (09/13 0523) Resp:  [18-20] 18 (09/13 0523) BP: (95-149)/(72-98) 122/76 (09/13 0523) SpO2:  [97 %-100 %] 99 % (09/13 0523) Weight:  [378 lb 9.6 oz (171.7 kg)] 378 lb 9.6 oz (171.7 kg) (09/13 0523) Last BM Date: 10/26/16  Weight change: Filed Weights   10/26/16 2305 10/27/16 0524 10/28/16 0523  Weight: (!) 380 lb 8 oz (172.6 kg) (!) 379 lb 12.8 oz (172.3 kg) (!) 378 lb 9.6 oz (171.7 kg)    Intake/Output:   Intake/Output Summary (Last 24 hours) at 10/28/16 0900 Last data filed at 10/28/16 0523  Gross per 24 hour  Intake             1080 ml  Output             2325 ml  Net            -1245 ml      Physical Exam    General:  Well appearing. No resp difficulty HEENT: Normal Neck: Supple. JVP 9-10 cm. Carotids 2+ bilat; no bruits. No lymphadenopathy or thyromegaly appreciated. Cor: PMI nondisplaced. Regular rate & rhythm. No rubs, gallops or murmurs. Lungs: Clear Abdomen: Soft, nontender, nondistended. No hepatosplenomegaly. No bruits or masses. Good bowel sounds. Extremities: No cyanosis, clubbing, or rash. 2+ edema to knees. Ted hose in place. Neuro: Alert & orientedx3, cranial nerves grossly intact. moves all 4 extremities w/o difficulty. Affect pleasant  Telemetry   Sinus tach 110s, Personally reviewed  EKG    Sinus tach on admit, personally reviewed  Labs    CBC  Recent  Labs  10/26/16 1832 10/27/16 0221  WBC 8.8 9.0  NEUTROABS  --  8.1*  HGB 14.3 14.1  HCT 43.8 43.0  MCV 92.6 92.5  PLT 289 273   Basic Metabolic Panel  Recent Labs  10/26/16 1832 10/27/16 0221 10/28/16 0440 10/28/16 0700  NA 136 135 135  --   K 3.8 4.4 4.2  --   CL 97* 97* 97*  --   CO2 25 21* 29  --   GLUCOSE 298* 396* 265*  --   BUN 8 10 21*  --   CREATININE 0.99 1.25* 1.03  --   CALCIUM 9.2 9.0 8.9  --   MG 2.0  --   --  2.2   Liver Function Tests  Recent Labs  10/26/16 1832  AST 43*  ALT 42  ALKPHOS 70  BILITOT 1.1  PROT 7.2  ALBUMIN 3.5   No results for input(s): LIPASE, AMYLASE in the last 72 hours. Cardiac Enzymes  Recent Labs  10/26/16 2108 10/27/16 0221 10/27/16 0739  TROPONINI 0.24* 0.19* 0.15*    BNP: BNP (last 3 results)  Recent Labs  04/19/16 2005 10/26/16 1832  BNP 409.4* 627.5*    ProBNP (last 3 results) No results  for input(s): PROBNP in the last 8760 hours.   D-Dimer No results for input(s): DDIMER in the last 72 hours. Hemoglobin A1C  Recent Labs  10/27/16 0221  HGBA1C 10.8*   Fasting Lipid Panel  Recent Labs  10/27/16 0221  CHOL 135  HDL 22*  LDLCALC 96  TRIG 83  CHOLHDL 6.1   Thyroid Function Tests  Recent Labs  10/27/16 0221  TSH 1.009    Other results:   Imaging     No results found.   Medications:     Scheduled Medications: . aspirin EC  81 mg Oral Daily  . atorvastatin  40 mg Oral q1800  . carvedilol  6.25 mg Oral BID WC  . enoxaparin (LOVENOX) injection  80 mg Subcutaneous Q24H  . furosemide  40 mg Intravenous Once  . furosemide  80 mg Intravenous BID  . insulin aspart  0-15 Units Subcutaneous TID WC  . insulin aspart  0-5 Units Subcutaneous QHS  . insulin glargine  25 Units Subcutaneous Daily  . isosorbide mononitrate  30 mg Oral Daily  . sacubitril-valsartan  1 tablet Oral BID  . sodium chloride flush  3 mL Intravenous Q12H  . spironolactone  12.5 mg Oral Daily      Infusions: . sodium chloride       PRN Medications:  sodium chloride, acetaminophen, hydrALAZINE, ipratropium-albuterol, ondansetron (ZOFRAN) IV, sodium chloride flush    Patient Profile   Jonathan [name] is a 31 y.o. male with h/o of systolic CHF, NICM, HTN, DM2, OSA, Asthma, and morbid obesity.  Admitted with worsening SOB and volume overload.   Assessment/Plan   1. Acute on chronic systolic CHF due to NICM - Reports EF 15-20% in [place] last year.  - Echo 10/27/16 LVEF 30-35%, Diffuse HK, Mild LAE, mod TR, PA peak pressure 31 mm Hg. - NYHA III-IIIb symptoms. Reports NYHA class II symptoms at basline. - Volume status remains elevated but improving.  - Continue lasix 80 mg IV BID. Will give metolazone 5 mg today. He is very anxious about the storm and would like to try and leave this evening if possible. Will augment diuresis as above.   - Continue coreg 6.25 mg BID  - Continue Entresto 24/26 mg BID.  - Increase spiro to 25 mg daily.  - Continue ted hose.  - Could consider dig but with baseline symptoms NYHA II, will hold off for now.  - Orders have been placed to obtain medical records from [facility] center in [place], [place] - Reinforced fluid restriction to < 2 L daily, sodium restriction to less than 2000 mg daily, and the importance of daily weights.   - Have asked HF navigator to see.  2. Chest pressure  - Suspect mild troponin elevated is 2/2 demand ischemia with volume overload.  - No s/s of ischemia.    3. Hypertensive urgency on admit - Will adjust meds in setting of treating his HF. Meds as above.  4. DM2  - Hgb A1c is 10.8. Needs close follow up and med adjustment. Needs insulin. Per primary.  5. Asthma - Per primary. Continue home meds and nebs prn.  6. Morbid Obesity - Needs weight loss.  7. OSA/OHS - Previously diagnosis with OSA, but no CPAP obtained with loss/lack of insurance. Now has insurance.  - Needs repeat sleep study.    Pressures improved. Diuresing, but only modestly. Metolazone as above. If determined to leave today will need very close follow up and labs.  Length of Stay: 2  Graciella Freer, PA-C  10/28/2016, 9:00 AM  Advanced Heart Failure Team Pager [phone] (M-F; 7a - 4p)  Please contact [facility] for night-coverage after hours (4p -7a ) and weekends on [email or web address]   Patient seen and examined with the above-signed Advanced Practice Provider and/or Housestaff. I personally reviewed laboratory data, imaging studies and relevant notes. I independently examined the patient and formulated the important aspects of the plan. I have edited the note to reflect any of my changes or salient points. I have personally discussed the plan with the patient and/or family.  Volume status improving but still overloaded. Continue IV lasix one more day. Agree with increasing spiro. Continue Entresto and carvedilol. Hopefully home in am. Renal function stable. K 4.2  Arvilla Meres, MD  5:33 PM

## 2016-10-28 NOTE — Progress Notes (Signed)
@[email or web address] @        PROGRESS NOTE                                                                                                                                                                                                             Patient Demographics:    Jonathan [name], is a 31 y.o. male, DOB - [birth year], MQK:[id number]  Admit date - 10/26/2016   Admitting Physician Clydie Braun, MD  Outpatient Primary MD for the patient is Patient, No Pcp Per  LOS - 2  Chief Complaint  Patient presents with  . Shortness of Breath       Brief Narrative  Jonathan [name] is a 31 y.o. male who recently moved from [place] and stopped taking his medications a few weeks ago with medical history significant of  the systolic CHF, HTN, diabetes mellitus type 2, asthma, and morbid obesity; who presents with about 2 weeks of intermittently progressively worsening shortness of breath.   Subjective:    Patient in bed, appears comfortable, denies any headache, no fever, no chest pain or pressure, improving shortness of breath , no abdominal pain. No focal weakness. .    Assessment  & Plan :     1. Acute on chronic systolic heart failure with last known EF of around 30% better from 20% per patient which was diagnosed in [place], due to noncompliance with medications and poorly controlled blood pressure, currently improving on combination of diuretics which include Lasix, Aldactone along with Coreg, Entresto and Imdur. Echo noted and Cards folloing. Continue salt and fluid restriction monitor BMP and weight. Given a dose of Zaroxolyn to augment his diuresis on 10/28/2016.  2. Hypertension. In poor control, medications adjusted blood pressure better will continue to monitor and adjust.  3. Morbid obesity with obstructive sleep apnea. Follow with PCP for weight loss, CPAP daily at bedtime.  4. Mildly elevated troponin and non-ACS pattern. Chest pain-free. Likely due to demand ischemia from #1 above.  Currently on aspirin, beta blocker, will add low-dose statin for risk factor stratification, cardiology following. Obtain echocardiogram.  5. DM type II. Currently on sliding-scale will added Lantus and will increase dose on 10/28/2016 for better control. Continue to monitor. Diabetic education provided.   CBG (last 3)   Recent Labs  10/27/16 2045 10/28/16 0734 10/28/16 1144  GLUCAP 356* 254* 260*      Diet : Diet heart healthy/carb modified Room service appropriate? Yes; Fluid consistency: Thin; Fluid restriction: 1500 mL Fluid    Family Communication  :  None  Code Status :  Full  Disposition Plan  :  TBD  Consults  :  Cards  Procedures  :    TTE - Left ventricle: The cavity size was normal. There was moderate concentric hypertrophy. Systolic function was moderately to severely reduced. The estimated ejection fraction was in the range of 30% to 35%. Diffuse hypokinesis. The study is not technically sufficient to allow evaluation of LV diastolic function. - Left atrium: The atrium was mildly dilated. - Tricuspid valve: There was moderate regurgitation. - Pulmonary arteries: Systolic pressure was mildly increased. PA  peak pressure: 31 mm Hg (S).  DVT Prophylaxis  :  Lovenox    Lab Results  Component Value Date   PLT 273 10/27/2016    Inpatient Medications  Scheduled Meds: . aspirin EC  81 mg Oral Daily  . atorvastatin  40 mg Oral q1800  . carvedilol  6.25 mg Oral BID WC  . enoxaparin (LOVENOX) injection  80 mg Subcutaneous Q24H  . furosemide  80 mg Intravenous BID  . insulin aspart  0-15 Units Subcutaneous TID WC  . insulin aspart  0-5 Units Subcutaneous QHS  . insulin glargine  25 Units Subcutaneous Daily  . isosorbide mononitrate  30 mg Oral Daily  . sacubitril-valsartan  1 tablet Oral BID  . sodium chloride flush  3 mL Intravenous Q12H  . spironolactone  12.5 mg Oral Daily   Continuous Infusions: . sodium chloride     PRN Meds:.sodium chloride,  acetaminophen, hydrALAZINE, ipratropium-albuterol, ondansetron (ZOFRAN) IV, sodium chloride flush  Antibiotics  :    Anti-infectives    None         Objective:   Vitals:   10/27/16 1500 10/27/16 2031 10/28/16 0131 10/28/16 0523  BP: (!) 130/97 135/72 95/79 122/76  Pulse: (!) 101 (!) 107 98 (!) 112  Resp: Temp: 98 F (36.7 C) 97.8 F (36.6 C) 98.2 F (36.8 C) 97.8 F (36.6 C)  TempSrc: Oral Oral Oral Oral  SpO2: 100% 97% 97% 99%  Weight:    (!) 171.7 kg (378 lb 9.6 oz)  Height:        Wt Readings from Last 3 Encounters:  10/28/16 (!) 171.7 kg (378 lb 9.6 oz)  11/27/12 (!) 146.5 kg (323 lb)     Intake/Output Summary (Last 24 hours) at 10/28/16 1214 Last data filed at 10/28/16 1110  Gross per 24 hour  Intake             1080 ml  Output             3125 ml  Net            -2045 ml     Physical Exam  Awake Alert, Oriented X 3, No new F.N deficits, Normal affect [place].AT,PERRAL Supple Neck,No JVD, No cervical lymphadenopathy appriciated.  Symmetrical Chest wall movement, Good air movement bilaterally, positive rales RRR,No Gallops,Rubs or new Murmurs, No Parasternal Heave +ve B.Sounds, Abd Soft, No tenderness, No organomegaly appriciated, No rebound - guarding or rigidity. No Cyanosis, Clubbing, 1+ bilateral leg edema, No new Rash or bruise     Data Review:    CBC  Recent Labs Lab 10/26/16 1832 10/27/16 0221  WBC 8.8 9.0  HGB 14.3 14.1  HCT 43.8 43.0  PLT 289 273  MCV 92.6 92.5  MCH 30.2 30.3  MCHC 32.6 32.8  RDW 13.8 13.9  LYMPHSABS  --  0.9  MONOABS  --  0.1  EOSABS  --  0.0  BASOSABS  --  0.0    Chemistries   Recent Labs Lab 10/26/16 1832 10/27/16 0221 10/28/16 0440 10/28/16 0700  NA 136 135 135  --   K 3.8 4.4 4.2  --   CL 97* 97* 97*  --   CO2 25 21* 29  --   GLUCOSE 298* 396* 265*  --   BUN 8 10 21*  --   CREATININE 0.99 1.25* 1.03  --   CALCIUM 9.2 9.0 8.9  --   MG 2.0  --   --  2.2  AST 43*  --   --   --   ALT  42  --   --   --   ALKPHOS 70  --   --   --   BILITOT 1.1  --   --   --    ------------------------------------------------------------------------------------------------------------------  Recent Labs  10/27/16 0221  CHOL 135  HDL 22*  LDLCALC 96  TRIG 83  CHOLHDL 6.1    Lab Results  Component Value Date   HGBA1C 10.8 (H) 10/27/2016   ------------------------------------------------------------------------------------------------------------------  Recent Labs  10/27/16 0221  TSH 1.009   ------------------------------------------------------------------------------------------------------------------ No results for input(s): VITAMINB12, FOLATE, FERRITIN, TIBC, IRON, RETICCTPCT in the last 72 hours.  Coagulation profile No results for input(s): INR, PROTIME in the last 168 hours.  No results for input(s): DDIMER in the last 72 hours.  Cardiac Enzymes  Recent Labs Lab 10/26/16 2108 10/27/16 0221 10/27/16 0739  TROPONINI 0.24* 0.19* 0.15*   ------------------------------------------------------------------------------------------------------------------    Component Value Date/Time   BNP 627.5 (H) 10/26/2016 1832    Micro Results No results found for this or any previous visit (from the past 240 hour(s)).  Radiology Reports Dg Chest 2 View  Result Date: 10/26/2016 CLINICAL DATA:  Dyspnea and lower leg edema times 2-3 weeks. EXAM: CHEST  2 VIEW COMPARISON:  04/19/2016 FINDINGS: Stable cardiomegaly. Bilateral interstitial prominence may reflect mild interstitial edema. No effusion or pneumothorax. No acute nor suspicious osseous abnormality. IMPRESSION: Cardiomegaly with mild interstitial edema. Electronically Signed   By: Tollie Eth M.D.   On: 10/26/2016 18:32    Time Spent in minutes  30   Susa Raring M.D on 10/28/2016 at 12:14 PM  Between 7am to 7pm - Pager - [phone] ( page via [email or web address], text pages only, please mention full 10 digit call back  number). After 7pm go to [email or web address] - password [email or web address]

## 2016-10-28 NOTE — Progress Notes (Signed)
Bath offered pt stated that he will wait for wife

## 2016-10-28 NOTE — Progress Notes (Signed)
Pt with 11 beats of wide QRS complex tachycardia. Will page MD and continue to monitor.

## 2016-10-29 LAB — BASIC METABOLIC PANEL
ANION GAP: 8 (ref 5–15)
BUN: 24 mg/dL — ABNORMAL HIGH (ref 6–20)
CALCIUM: 9.1 mg/dL (ref 8.9–10.3)
CHLORIDE: 95 mmol/L — AB (ref 101–111)
CO2: 33 mmol/L — AB (ref 22–32)
Creatinine, Ser: 1.04 mg/dL (ref 0.61–1.24)
GFR calc Af Amer: >60 mL/min (ref >60)
GFR calc non Af Amer: >60 mL/min (ref >60)
GLUCOSE: 207 mg/dL — AB (ref 65–99)
Potassium: 3.9 mmol/L (ref 3.5–5.1)
Sodium: 136 mmol/L (ref 135–145)

## 2016-10-29 LAB — GLUCOSE, CAPILLARY
GLUCOSE-CAPILLARY: 235 mg/dL — AB (ref 65–99)
Glucose-Capillary: 194 mg/dL — ABNORMAL HIGH (ref 65–99)

## 2016-10-29 MED ORDER — INSULIN GLARGINE 100 UNIT/ML ~~LOC~~ SOLN: 30.0000 [IU] | Freq: Every day | SUBCUTANEOUS | 0 refills | Status: DC

## 2016-10-29 MED ORDER — METOLAZONE 2.5 MG PO TABS
2.5000 mg | ORAL_TABLET | Freq: Once | ORAL | Status: AC
  Administered 2016-10-29: 2.5 mg via ORAL
  Filled 2016-10-29: qty 1

## 2016-10-29 MED ORDER — BLOOD GLUCOSE MONITOR KIT: PACK | 1 refills | Status: AC

## 2016-10-29 MED ORDER — TORSEMIDE 20 MG PO TABS: 40.0000 mg | ORAL_TABLET | Freq: Two times a day (BID) | ORAL | 0 refills | Status: DC

## 2016-10-29 MED ORDER — CARVEDILOL 6.25 MG PO TABS: 6.2500 mg | ORAL_TABLET | Freq: Two times a day (BID) | ORAL | 6 refills | Status: DC

## 2016-10-29 MED ORDER — POTASSIUM CHLORIDE CRYS ER 20 MEQ PO TBCR
40.0000 meq | EXTENDED_RELEASE_TABLET | Freq: Once | ORAL | Status: AC
  Administered 2016-10-29: 40 meq via ORAL
  Filled 2016-10-29: qty 2

## 2016-10-29 MED ORDER — INSULIN ASPART 100 UNIT/ML ~~LOC~~ SOLN: SUBCUTANEOUS | 0 refills | Status: DC

## 2016-10-29 MED ORDER — SACUBITRIL-VALSARTAN 24-26 MG PO TABS: 1.0000 | ORAL_TABLET | Freq: Two times a day (BID) | ORAL | 3 refills | Status: DC

## 2016-10-29 MED ORDER — METOLAZONE 2.5 MG PO TABS: 2.5000 mg | ORAL_TABLET | ORAL | 2 refills | Status: DC | PRN

## 2016-10-29 MED ORDER — LIVING WELL WITH DIABETES BOOK
Freq: Once | Status: AC
  Administered 2016-10-29: 10:00:00
  Filled 2016-10-29: qty 1

## 2016-10-29 MED ORDER — "INSULIN SYRINGE-NEEDLE U-100 25G X 1"" 1 ML MISC": 0 refills | Status: DC

## 2016-10-29 MED ORDER — SPIRONOLACTONE 25 MG PO TABS: 25.0000 mg | ORAL_TABLET | Freq: Every day | ORAL | 6 refills | Status: DC

## 2016-10-29 NOTE — Discharge Instructions (Signed)
Follow with Primary MD  in 7 days   Get CBC, CMP, 2 view Chest X ray checked  by Primary MD or SNF MD in 5-7 days ( we routinely change or add medications that can affect your baseline labs and fluid status, therefore we recommend that you get the mentioned basic workup next visit with your PCP, your PCP may decide not to get them or add new tests based on their clinical decision)  Activity: As tolerated with Full fall precautions use walker/cane & assistance as needed  Disposition Home   Diet:   Diet heart healthy/carb modified - Check your Weight same time everyday, if you gain over 2 pounds, or you develop in leg swelling, experience more shortness of breath or chest pain, call your Primary MD immediately. Follow Cardiac Low Salt Diet and 1.5 lit/day fluid restriction.  Accuchecks 4 times/day, Once in AM empty stomach and then before each meal. Log in all results and show them to your Prim.MD in 3 days. If any glucose reading is under 80 or above 300 call your Prim MD immidiately. Follow Low glucose instructions for glucose under 80 as instructed.  On your next visit with your primary care physician please Get Medicines reviewed and adjusted.  Please request your Prim.MD to go over all Hospital Tests and Procedure/Radiological results at the follow up, please get all Hospital records sent to your Prim MD by signing hospital release before you go home.  If you experience worsening of your admission symptoms, develop shortness of breath, life threatening emergency, suicidal or homicidal thoughts you must seek medical attention immediately by calling 911 or calling your MD immediately  if symptoms less severe.  You Must read complete instructions/literature along with all the possible adverse reactions/side effects for all the Medicines you take and that have been prescribed to you. Take any new Medicines after you have completely understood and accpet all the possible adverse reactions/side  effects.   Do not drive, operate heavy machinery, perform activities at heights, swimming or participation in water activities or provide baby sitting services if your were admitted for syncope or siezures until you have seen by Primary MD or a Neurologist and advised to do so again.  Do not drive when taking Pain medications.    Do not take more than prescribed Pain, Sleep and Anxiety Medications  Special Instructions: If you have smoked or chewed Tobacco  in the last 2 yrs please stop smoking, stop any regular Alcohol  and or any Recreational drug use.  Wear Seat belts while driving.   Please note  You were cared for by a hospitalist during your hospital stay. If you have any questions about your discharge medications or the care you received while you were in the hospital after you are discharged, you can call the unit and asked to speak with the hospitalist on call if the hospitalist that took care of you is not available. Once you are discharged, your primary care physician will handle any further medical issues. Please note that NO REFILLS for any discharge medications will be authorized once you are discharged, as it is imperative that you return to your primary care physician (or establish a relationship with a primary care physician if you do not have one) for your aftercare needs so that they can reassess your need for medications and monitor your lab values.  [name] DOB 03-22-1989 was taking care of her husband Jonathan [name] who was admitted to the [facility] on 10/26/2016 and Discharged  10/29/2016 and she should be excused from work/school   for 4  days starting from date -  10/26/2016 , may return to work/school without any restrictions.  Call Jonathan Raring MD, [facility]  [phone] with questions.  Jonathan [name] M.D on 10/29/2016,at 9:26 AM  [facility]   Office   [phone]

## 2016-10-29 NOTE — Progress Notes (Signed)
Pt is alert and oriented guarded with care and self administering Insulin with encouragement and soft touch patient patient was successful states that it was not as bad as previously being on insulin. Iv out script and discharge papers given.

## 2016-10-29 NOTE — Progress Notes (Signed)
Advanced Heart Failure Rounding Note  PCP: New to [place] Primary Cardiologist: New to [place]  Subjective:    Feeling much better. Didn't sleep well last night with being checked on and early labs. Anxious to get home.   Negative 3.6 L and down another 5 lbs.   Echo 10/27/16 LVEF 30-35%, Diffuse HK, Mild LAE, mod TR, PA peak pressure 31 mm Hg.  Objective:   Weight Range: (!) 373 lb 8 oz (169.4 kg) Body mass index is 47.95 kg/m.   Vital Signs:   Temp:  [97.8 F (36.6 C)-98.7 F (37.1 C)] 98.1 F (36.7 C) (09/14 0813) Pulse Rate:  [97-108] 103 (09/14 0813) Resp:  [18] 18 (09/14 0400) BP: (103-128)/(50-90) 112/90 (09/14 0813) SpO2:  [95 %-99 %] 95 % (09/14 0813) Weight:  [373 lb 8 oz (169.4 kg)] 373 lb 8 oz (169.4 kg) (09/14 0400) Last BM Date: 10/26/16  Weight change: Filed Weights   10/27/16 0524 10/28/16 0523 10/29/16 0400  Weight: (!) 379 lb 12.8 oz (172.3 kg) (!) 378 lb 9.6 oz (171.7 kg) (!) 373 lb 8 oz (169.4 kg)    Intake/Output:   Intake/Output Summary (Last 24 hours) at 10/29/16 0830 Last data filed at 10/29/16 0401  Gross per 24 hour  Intake              960 ml  Output             4595 ml  Net            -3635 ml      Physical Exam    General: Fatigued appearing. NAD.  HEENT: Normal Neck: Supple. JVP 7-8 cm. Carotids 2+ bilat; no bruits. No thyromegaly or nodule noted. Cor: PMI nondisplaced. RRR, No M/G/R noted Lungs: Mildly diminished basilar sounds. Abdomen: Soft, non-tender, non-distended, no HSM. No bruits or masses. +BS  Extremities: No cyanosis, clubbing, or rash. 1-2+ edema to knees. Ted hose in place.  Neuro: Alert & orientedx3, cranial nerves grossly intact. moves all 4 extremities w/o difficulty. Affect pleasant   Telemetry   Sinus tach 100-110s, Personally reviewed.   EKG    Sinus tach on admit, personally reviewed  Labs    CBC  Recent Labs  10/26/16 1832 10/27/16 0221  WBC 8.8 9.0  NEUTROABS  --  8.1*  HGB 14.3 14.1  HCT  43.8 43.0  MCV 92.6 92.5  PLT 289 273   Basic Metabolic Panel  Recent Labs  10/26/16 1832  10/28/16 0440 10/28/16 0700 10/29/16 0553  NA 136  < > 135  --  136  K 3.8  < > 4.2  --  3.9  CL 97*  < > 97*  --  95*  CO2 25  < > 29  --  33*  GLUCOSE 298*  < > 265*  --  207*  BUN 8  < > 21*  --  24*  CREATININE 0.99  < > 1.03  --  1.04  CALCIUM 9.2  < > 8.9  --  9.1  MG 2.0  --   --  2.2  --   < > = values in this interval not displayed. Liver Function Tests  Recent Labs  10/26/16 1832  AST 43*  ALT 42  ALKPHOS 70  BILITOT 1.1  PROT 7.2  ALBUMIN 3.5   No results for input(s): LIPASE, AMYLASE in the last 72 hours. Cardiac Enzymes  Recent Labs  10/26/16 2108 10/27/16 0221 10/27/16 0739  TROPONINI 0.24* 0.19*  0.15*    BNP: BNP (last 3 results)  Recent Labs  04/19/16 2005 10/26/16 1832  BNP 409.4* 627.5*    ProBNP (last 3 results) No results for input(s): PROBNP in the last 8760 hours.   D-Dimer No results for input(s): DDIMER in the last 72 hours. Hemoglobin A1C  Recent Labs  10/27/16 0221  HGBA1C 10.8*   Fasting Lipid Panel  Recent Labs  10/27/16 0221  CHOL 135  HDL 22*  LDLCALC 96  TRIG 83  CHOLHDL 6.1   Thyroid Function Tests  Recent Labs  10/27/16 0221  TSH 1.009    Other results:   Imaging    No results found.   Medications:     Scheduled Medications: . aspirin EC  81 mg Oral Daily  . atorvastatin  40 mg Oral q1800  . carvedilol  6.25 mg Oral BID WC  . enoxaparin (LOVENOX) injection  80 mg Subcutaneous Q24H  . furosemide  80 mg Intravenous BID  . insulin aspart  0-15 Units Subcutaneous TID WC  . insulin aspart  0-5 Units Subcutaneous QHS  . insulin glargine  25 Units Subcutaneous Daily  . isosorbide mononitrate  30 mg Oral Daily  . living well with diabetes book   Does not apply Once  . metolazone  2.5 mg Oral Once  . potassium chloride  40 mEq Oral Once  . sacubitril-valsartan  1 tablet Oral BID  . sodium  chloride flush  3 mL Intravenous Q12H  . spironolactone  12.5 mg Oral Daily    Infusions: . sodium chloride      PRN Medications: sodium chloride, acetaminophen, hydrALAZINE, ipratropium-albuterol, ondansetron (ZOFRAN) IV, sodium chloride flush    Patient Profile   Jonathan [name] is a 31 y.o. male with h/o of systolic CHF, NICM, HTN, DM2, OSA, Asthma, and morbid obesity.  Admitted with worsening SOB and volume overload.   Assessment/Plan   1. Acute on chronic systolic CHF due to NICM - Reports EF 15-20% in [place] last year.  - Echo 10/27/16 LVEF 30-35%, Diffuse HK, Mild LAE, mod TR, PA peak pressure 31 mm Hg. - NYHA III-IIIb symptoms. Reports NYHA class II symptoms at basline. - Volume status improving. Remains somewhat elevated. - Will give dose of 80 mg IV lasix and 2.5 metolazone this am. Then transition to 40 mg torsemide BID for home.   - Continue coreg 6.25 mg BID  - Continue Entresto 24/26 mg BID.  - continue spiro 25 mg daily.  - Continue ted hose.  - Could consider dig but with baseline symptoms NYHA II, will hold off for now.  - Orders have been placed to obtain medical records from [facility] center in [place], [place] - Reinforced fluid restriction to < 2 L daily, sodium restriction to less than 2000 mg daily, and the importance of daily weights.   - He has been referred to paramedicine.  2. Chest pressure  - Suspect mild troponin elevated is 2/2 demand ischemia with volume overload.  - No s/s of ischemia.    - Lipids/Atorvastatin per primary. 3. Hypertensive urgency on admit - Will adjust meds in setting of treating his HF. Meds as above.  4. DM2  - Hgb A1c is 10.8. Needs close follow up and med adjustment. Needs insulin. Per primary.  5. Asthma - Per primary. Continue home meds and nebs prn.  6. Morbid Obesity - Needs weight loss. Will continue to work with as outpatient.  7. OSA/OHS - Previously diagnosis with OSA,  but no CPAP obtained  with loss/lack of insurance. Now has insurance.  - Needs repeat sleep study as outpatient.   Much improved. Will augment diuresis this am with metolazone and then transition to torsemide for home.   Length of Stay: 3  Luane School  10/29/2016, 8:30 AM  Advanced Heart Failure Team Pager [phone] (M-F; 7a - 4p)  Please contact [facility] for night-coverage after hours (4p -7a ) and weekends on [email or web address]  Patient seen and examined with the above-signed Advanced Practice Provider and/or Housestaff. I personally reviewed laboratory data, imaging studies and relevant notes. I independently examined the patient and formulated the important aspects of the plan. I have edited the note to reflect any of my changes or salient points. I have personally discussed the plan with the patient and/or family.  Volume status improving but still not back to baseline however he is adamant that he wants to go home. Will give IV lasix and metolazone this am then can discharge midday. Suspect he will stabilize with resumption of home HF meds. F/u in HF Clinic. Suspect he would benefit from Paramedicine.   Arvilla Meres, MD  9:13 AM

## 2016-10-29 NOTE — Progress Notes (Signed)
Entresto coupon card given to patient and his spouse with explanation of usage; also informed them of the medication assistance program through [place]; pharmacy of choice is [facility]; pharmacy at [facility] and they do have the medication in stock; B [place] RN,MHA,BSN [phone]

## 2016-10-29 NOTE — Progress Notes (Signed)
Inpatient Diabetes Program Recommendations  AACE/ADA: New Consensus Statement on Inpatient Glycemic Control (2015)  Target Ranges:  Prepandial:   less than 140 mg/dL      Peak postprandial:   less than 180 mg/dL (1-2 hours)      Critically ill patients:  140 - 180 mg/dL   Lab Results  Component Value Date   GLUCAP 194 (H) 10/29/2016   HGBA1C 10.8 (H) 10/27/2016      Review of Glycemic Control  Results for Jonathan, [name] (MRN [id number]) as of 10/29/2016 07:51  Ref. Range 10/28/2016 07:34 10/28/2016 11:44 10/28/2016 17:31 10/28/2016 21:20 10/29/2016 07:19  Glucose-Capillary Latest Ref Range: 65 - 99 mg/dL 628 (H) 315 (H) 176 (H) 301 (H) 194 (H)    Diabetes history: DM2 Outpatient Diabetes medications: None Current orders for Inpatient glycemic control: Lantus 25 units QD, Novolog 0-15 units tidwc and Novolog 0-5 units qhs  Inpatient Diabetes Program Recommendations:   Consider adding Novolog 6 units tidwc for meal coverage insulin. Fasting blood sugar elevated, consider increasing Lantus insulin to 34 units qhs (0.2units/kg)  Jonathan Racer, RN, [place], [place], CDE Diabetes Coordinator Inpatient Diabetes Program  [phone] (Team Pager) [phone] [facility] Office) 10/29/2016 7:54 AM

## 2016-10-29 NOTE — Progress Notes (Signed)
Inpatient Diabetes Program Recommendations  AACE/ADA: New Consensus Statement on Inpatient Glycemic Control (2015)  Target Ranges:  Prepandial:   less than 140 mg/dL      Peak postprandial:   less than 180 mg/dL (1-2 hours)      Critically ill patients:  140 - 180 mg/dL   Lab Results  Component Value Date   GLUCAP 235 (H) 10/29/2016   HGBA1C 10.8 (H) 10/27/2016    Review of Glycemic Control  Long conversation with pt and wife regarding insulin administration. Pt states wife will give his insulin because he has fear of needles. Spoke to him about being responsible for himself, as wife may not be around all the time. Pt willing to give his own insulin today at lunch. Discussed with RN to allow pt to give own insulin prior to discharge.  Discussed HgbA1C of 10.8% and importance of reducing this to prevent long-term complications.  Thank you. Ailene Ards, RD, LDN, CDE Inpatient Diabetes Coordinator [phone]

## 2016-10-29 NOTE — Discharge Summary (Signed)
Jonathan [name] JOI:[id number] DOB: [birth year] DOA: 10/26/2016  PCP: Patient, No Pcp Per  Admit date: 10/26/2016  Discharge date: 10/29/2016  Admitted From: Home  Disposition:  Home   Recommendations for Outpatient Follow-up:   Follow up with PCP in 1-2 weeks  PCP Please obtain BMP/CBC, 2 view CXR in 1week,  (see Discharge instructions)   PCP Please follow up on the following pending results: None   Home Health: None   Equipment/Devices: None  Consultations: Cards Discharge Condition: Stable   CODE STATUS: Full   Diet Recommendation: Diet heart healthy/carb modified  ; Fluid restriction: 1500 mL/day   Chief Complaint  Patient presents with  . Shortness of Breath     Brief history of present illness from the day of admission and additional interim summary    Jonathan [name] a 31 y.o.malewho recently moved from [place] and stopped taking his medications a few weeks ago with medical history significant of the systolic CHF, HTN, diabetes mellitus type 2,asthma,and morbid obesity; who presents with about 2 weeks of intermittently progressively worsening shortness of breath.                                                                 Hospital Course    1. Acute on chronic systolic heart failure with last known EF of around 30% better from 20% per patient which was diagnosed in [place], due to noncompliance with medications and poorly controlled blood pressure, Much improved on combination of diuretics which included Lasix, Zaroxolyn, Aldactone along with Coreg, Entresto and Imdur. Echo noted as below, he was seen by cardiology. Now close to baseline although still has some fluid overload. He will be placed on Demadex twice a day along with Entresto Coreg and Aldactone, instructions given on fluid and  salt restriction, follow with PCP and cardiology closely. Cardiology cleared him for discharge as Patient was adamant to be discharged today.    2. Hypertension. Was in poor control, blood pressure medications adjusted as below, PCP to monitor blood pressure and adjust medications as needed.  3. Morbid obesity with obstructive sleep apnea. Follow with PCP for weight loss, CPAP daily at bedtime.  4. Mildly elevated troponin and non-ACS pattern. Chest pain-free. Likely due to demand ischemia from #1 above. Currently on aspirin, beta blocker, will add low-dose statin for risk factor stratification, cardiology following. Stable echocardiogram placed on aspirin.  5. DM type II. A1c was above 10, he was not regularly taking any diabetic medications, diabetic education was provided, placed on Lantus along with sliding scale to follow with the free clinic for diabetic medication supplies and monitoring. Instructions again given in writing.    Discharge diagnosis     Principal Problem:   Acute exacerbation of CHF (congestive heart failure) (HCC) Active Problems:   Diabetes  mellitus type 2 in obese (HCC)   Obesity, Class III, BMI 40-49.9 (morbid obesity) (HCC)   Asthma, chronic, mild persistent, with acute exacerbation   Hypertensive urgency   NSTEMI (non-ST elevated myocardial infarction) (HCC)   Acute on chronic systolic (congestive) heart failure [facility])    Discharge instructions    Discharge Instructions    Discharge instructions    Complete by:  As directed    Follow with Primary MD  in 7 days   Get CBC, CMP, 2 view Chest X ray checked  by Primary MD or SNF MD in 5-7 days ( we routinely change or add medications that can affect your baseline labs and fluid status, therefore we recommend that you get the mentioned basic workup next visit with your PCP, your PCP may decide not to get them or add new tests based on their clinical decision)  Activity: As tolerated with Full fall  precautions use walker/cane & assistance as needed  Disposition Home   Diet:   Diet heart healthy/carb modified - Check your Weight same time everyday, if you gain over 2 pounds, or you develop in leg swelling, experience more shortness of breath or chest pain, call your Primary MD immediately. Follow Cardiac Low Salt Diet and 1.5 lit/day fluid restriction.  Accuchecks 4 times/day, Once in AM empty stomach and then before each meal. Log in all results and show them to your Prim.MD in 3 days. If any glucose reading is under 80 or above 300 call your Prim MD immidiately. Follow Low glucose instructions for glucose under 80 as instructed.    On your next visit with your primary care physician please Get Medicines reviewed and adjusted.  Please request your Prim.MD to go over all Hospital Tests and Procedure/Radiological results at the follow up, please get all Hospital records sent to your Prim MD by signing hospital release before you go home.  If you experience worsening of your admission symptoms, develop shortness of breath, life threatening emergency, suicidal or homicidal thoughts you must seek medical attention immediately by calling 911 or calling your MD immediately  if symptoms less severe.  You Must read complete instructions/literature along with all the possible adverse reactions/side effects for all the Medicines you take and that have been prescribed to you. Take any new Medicines after you have completely understood and accpet all the possible adverse reactions/side effects.   Do not drive, operate heavy machinery, perform activities at heights, swimming or participation in water activities or provide baby sitting services if your were admitted for syncope or siezures until you have seen by Primary MD or a Neurologist and advised to do so again.  Do not drive when taking Pain medications.    Do not take more than prescribed Pain, Sleep and Anxiety Medications  Special  Instructions: If you have smoked or chewed Tobacco  in the last 2 yrs please stop smoking, stop any regular Alcohol  and or any Recreational drug use.  Wear Seat belts while driving.   Please note  You were cared for by a hospitalist during your hospital stay. If you have any questions about your discharge medications or the care you received while you were in the hospital after you are discharged, you can call the unit and asked to speak with the hospitalist on call if the hospitalist that took care of you is not available. Once you are discharged, your primary care physician will handle any further medical issues. Please note that NO REFILLS for any  discharge medications will be authorized once you are discharged, as it is imperative that you return to your primary care physician (or establish a relationship with a primary care physician if you do not have one) for your aftercare needs so that they can reassess your need for medications and monitor your lab values.                                                       [name] DOB 03-22-1989 was taking care of her husband Jonathan [name] who was admitted to the [facility] on 10/26/2016 and Discharged  10/29/2016 and she should be excused from work/school   for 4  days starting from date -  10/26/2016 , may return to work/school without any restrictions.  Call Lala Lund MD, [facility]  [phone] with questions.  Lala Lund M.D on 10/29/2016,at 9:26 AM  [facility]   Office  [phone]   Increase activity slowly    Complete by:  As directed       Discharge Medications   Allergies as of 10/29/2016      Reactions   Hydrocodone    Hives       Medication List    STOP taking these medications   amLODipine 5 MG tablet Commonly known as:  NORVASC   amoxicillin 500 MG capsule Commonly known as:  AMOXIL   ENTRESTO 49-51 MG Generic drug:  sacubitril-valsartan Replaced by:   sacubitril-valsartan 24-26 MG   furosemide 40 MG tablet Commonly known as:  LASIX   hydrochlorothiazide 25 MG tablet Commonly known as:  HYDRODIURIL   metoprolol tartrate 100 MG tablet Commonly known as:  LOPRESSOR   naproxen 500 MG tablet Commonly known as:  NAPROSYN     TAKE these medications   albuterol 108 (90 Base) MCG/ACT inhaler Commonly known as:  PROVENTIL HFA;VENTOLIN HFA Inhale 2 puffs into the lungs every 4 (four) hours as needed for wheezing. Dispense with aerochamber   aspirin EC 81 MG tablet Take 81 mg by mouth daily.   blood glucose meter kit and supplies Kit Dispense based on patient and insurance preference. Use up to four times daily as directed. (FOR ICD-9 250.00, 250.01). For QAC - HS accuchecks. May switch to any brand.   BREO ELLIPTA 200-25 MCG/INH Aepb Generic drug:  fluticasone furoate-vilanterol Inhale 1 puff into the lungs daily.   carvedilol 6.25 MG tablet Commonly known as:  COREG Take 1 tablet (6.25 mg total) by mouth 2 (two) times daily with a meal.   ibuprofen 800 MG tablet Commonly known as:  ADVIL,MOTRIN Take 800 mg by mouth every 8 (eight) hours.   insulin aspart 100 UNIT/ML injection Commonly known as:  NOVOLOG Before each meal 3 times a day, 140-199 - 2 units, 200-250 - 4 units, 251-299 - 6 units,  300-349 - 8 units,  350 or above 10 units. Dispense syringes and needles as needed, Ok to switch to PEN if approved. Substitute to any brand approved. DX DM2, Code E11.65   insulin glargine 100 UNIT/ML injection Commonly known as:  LANTUS Inject 0.3 mLs (30 Units total) into the skin at bedtime. Dispense insulin pen if approved, if not dispense as needed syringes and needles for 1 month supply. Can switch to Levemir. Diagnosis E 11.65.   Insulin Syringe-Needle U-100 25G  X 1" 1 ML Misc For 4 times a day insulin SQ, 1 month supply. Diagnosis E11.65   metolazone 2.5 MG tablet Commonly known as:  ZAROXOLYN Take 1 tablet (2.5 mg total) by  mouth as needed. As directed by HF clinic for weight gain of 3 lbs overnight or 5 lbs within one week. What changed:  when to take this  reasons to take this  additional instructions   sacubitril-valsartan 24-26 MG Commonly known as:  ENTRESTO Take 1 tablet by mouth 2 (two) times daily. Replaces:  ENTRESTO 49-51 MG   spironolactone 25 MG tablet Commonly known as:  ALDACTONE Take 1 tablet (25 mg total) by mouth daily.   torsemide 20 MG tablet Commonly known as:  DEMADEX Take 2 tablets (40 mg total) by mouth 2 (two) times daily.   Vitamin D 2000 units tablet Take 2,000 Units by mouth daily.            Discharge Care Instructions        Start     Ordered   10/29/16 0000  carvedilol (COREG) 6.25 MG tablet  2 times daily with meals    Question:  Supervising Provider  Answer:  Jolaine Artist   10/29/16 0839   10/29/16 0000  metolazone (ZAROXOLYN) 2.5 MG tablet  As needed    Question:  Supervising Provider  Answer:  Jolaine Artist   10/29/16 0839   10/29/16 0000  sacubitril-valsartan (ENTRESTO) 24-26 MG  2 times daily    Question:  Supervising Provider  Answer:  Jolaine Artist   10/29/16 0839   10/29/16 0000  spironolactone (ALDACTONE) 25 MG tablet  Daily    Question:  Supervising Provider  Answer:  Jolaine Artist   10/29/16 0839   10/29/16 0000  Increase activity slowly     10/29/16 0931   10/29/16 0000  Discharge instructions    Comments:  Follow with Primary MD  in 7 days   Get CBC, CMP, 2 view Chest X ray checked  by Primary MD or SNF MD in 5-7 days ( we routinely change or add medications that can affect your baseline labs and fluid status, therefore we recommend that you get the mentioned basic workup next visit with your PCP, your PCP may decide not to get them or add new tests based on their clinical decision)  Activity: As tolerated with Full fall precautions use walker/cane & assistance as needed  Disposition Home   Diet:   Diet heart  healthy/carb modified - Check your Weight same time everyday, if you gain over 2 pounds, or you develop in leg swelling, experience more shortness of breath or chest pain, call your Primary MD immediately. Follow Cardiac Low Salt Diet and 1.5 lit/day fluid restriction.  Accuchecks 4 times/day, Once in AM empty stomach and then before each meal. Log in all results and show them to your Prim.MD in 3 days. If any glucose reading is under 80 or above 300 call your Prim MD immidiately. Follow Low glucose instructions for glucose under 80 as instructed.    On your next visit with your primary care physician please Get Medicines reviewed and adjusted.  Please request your Prim.MD to go over all Hospital Tests and Procedure/Radiological results at the follow up, please get all Hospital records sent to your Prim MD by signing hospital release before you go home.  If you experience worsening of your admission symptoms, develop shortness of breath, life threatening emergency, suicidal or homicidal thoughts you  must seek medical attention immediately by calling 911 or calling your MD immediately  if symptoms less severe.  You Must read complete instructions/literature along with all the possible adverse reactions/side effects for all the Medicines you take and that have been prescribed to you. Take any new Medicines after you have completely understood and accpet all the possible adverse reactions/side effects.   Do not drive, operate heavy machinery, perform activities at heights, swimming or participation in water activities or provide baby sitting services if your were admitted for syncope or siezures until you have seen by Primary MD or a Neurologist and advised to do so again.  Do not drive when taking Pain medications.    Do not take more than prescribed Pain, Sleep and Anxiety Medications  Special Instructions: If you have smoked or chewed Tobacco  in the last 2 yrs please stop smoking, stop any  regular Alcohol  and or any Recreational drug use.  Wear Seat belts while driving.   Please note  You were cared for by a hospitalist during your hospital stay. If you have any questions about your discharge medications or the care you received while you were in the hospital after you are discharged, you can call the unit and asked to speak with the hospitalist on call if the hospitalist that took care of you is not available. Once you are discharged, your primary care physician will handle any further medical issues. Please note that NO REFILLS for any discharge medications will be authorized once you are discharged, as it is imperative that you return to your primary care physician (or establish a relationship with a primary care physician if you do not have one) for your aftercare needs so that they can reassess your need for medications and monitor your lab values.                                                       [name] DOB 03-22-1989 was taking care of her husband Jonathan [name] who was admitted to the [facility] on 10/26/2016 and Discharged  10/29/2016 and she should be excused from work/school   for 4  days starting from date -  10/26/2016 , may return to work/school without any restrictions.  Call Lala Lund MD, [facility]  [phone] with questions.  Lala Lund M.D on 10/29/2016,at 9:26 AM  [facility]   Office  [phone]   10/29/16 0931   10/29/16 0000  insulin glargine (LANTUS) 100 UNIT/ML injection  Daily at bedtime     10/29/16 0934   10/29/16 0000  insulin aspart (NOVOLOG) 100 UNIT/ML injection     10/29/16 0934   10/29/16 0000  blood glucose meter kit and supplies KIT    Question Answer Comment  Number of strips 90   Number of lancets 90      10/29/16 0934   10/29/16 0000  Insulin Syringe-Needle U-100 25G X 1" 1 ML MISC     10/29/16 0934   10/29/16 0000  torsemide (DEMADEX) 20 MG tablet  2 times daily      10/29/16 0942      Follow-up Information    [facility] Follow up on 11/08/2016.   Specialty:  Cardiology Why:  at 1030 for post hospital follow up. Please  bring all of your medications to your visit. The code for parking is 7002. Leisure centre manager thru [address] off of [address]. Underground parking on your right. Can also park in lower ED lot and enter thru blue awning. Contact information: [address] [id number] [place] [phone]       PCP. Schedule an appointment as soon as possible for a visit in 1 week(s).        Rand. Schedule an appointment as soon as possible for a visit in 1 week(s).   Why:  go today for Insulin supplies Contact information: [place] [address] [phone]          Major procedures and Radiology Reports - PLEASE review detailed and final reports thoroughly  -     TTE - Left ventricle: The cavity size was normal. There was moderateconcentric hypertrophy. Systolic function was moderately toseverely reduced. The estimated ejection fraction was in therange of 30% to 35%. Diffuse hypokinesis. The study is nottechnically sufficient to allow evaluation of LV diastolicfunction.  Left atrium: The atrium was mildly dilated. Tricuspid valve: There was moderate regurgitation.  Pulmonary arteries: Systolic pressure was mildly increased. PA  peak pressure: 31 mm Hg (S).   Dg Chest 2 View  Result Date: 10/26/2016 CLINICAL DATA:  Dyspnea and lower leg edema times 2-3 weeks. EXAM: CHEST  2 VIEW COMPARISON:  04/19/2016 FINDINGS: Stable cardiomegaly. Bilateral interstitial prominence may reflect mild interstitial edema. No effusion or pneumothorax. No acute nor suspicious osseous abnormality. IMPRESSION: Cardiomegaly with mild interstitial edema. Electronically Signed   By: Ashley Royalty M.D.   On: 10/26/2016 18:32     Micro Results     No results found for this or any previous visit (from the past 240 hour(s)).  Today   Subjective    Jonathan [name] today has no headache,no chest abdominal pain,no new weakness tingling or numbness, feels much better wants to go home today.    Objective   Blood pressure 112/90, pulse (!) 103, temperature 98.1 F (36.7 C), temperature source Oral, resp. rate 18, height 6' 2"  (1.88 m), weight (!) 169.4 kg (373 lb 8 oz), SpO2 95 %.   Intake/Output Summary (Last 24 hours) at 10/29/16 0942 Last data filed at 10/29/16 0918  Gross per 24 hour  Intake             1200 ml  Output             4595 ml  Net            -3395 ml    Exam Awake Alert, Oriented x 3, No new F.N deficits, Normal affect [place].AT,PERRAL Supple Neck,No JVD, No cervical lymphadenopathy appriciated.  Symmetrical Chest wall movement, Good air movement bilaterally, few rales RRR,No Gallops,Rubs or new Murmurs, No Parasternal Heave +ve B.Sounds, Abd Soft, Non tender, No organomegaly appriciated, No rebound -guarding or rigidity. No Cyanosis, Clubbing , 1+ edema, No new Rash or bruise   Data Review   CBC w Diff:  Lab Results  Component Value Date   WBC 9.0 10/27/2016   HGB 14.1 10/27/2016   HCT 43.0 10/27/2016   PLT 273 10/27/2016   LYMPHOPCT 9 10/27/2016   MONOPCT 1 10/27/2016   EOSPCT 0 10/27/2016   BASOPCT 0 10/27/2016    CMP:  Lab Results  Component Value Date   NA 136 10/29/2016   K 3.9 10/29/2016   CL 95 (L) 10/29/2016   CO2  33 (H) 10/29/2016   BUN 24 (H) 10/29/2016   CREATININE 1.04 10/29/2016   PROT 7.2 10/26/2016   ALBUMIN 3.5 10/26/2016   BILITOT 1.1 10/26/2016   ALKPHOS 70 10/26/2016   AST 43 (H) 10/26/2016   ALT 42 10/26/2016  .  Lab Results  Component Value Date   HGBA1C 10.8 (H) 10/27/2016    CBG (last 3)   Recent Labs  10/28/16 1731 10/28/16 2120 10/29/16 0719  GLUCAP 237* 301* 194*     Total Time in preparing paper work, data evaluation and  todays exam - 3 minutes  Lala Lund M.D on 10/29/2016 at 9:42 AM  [facility]   Office  [phone]

## 2016-10-30 NOTE — Progress Notes (Signed)
Received prior authorization fax for Novolog prescription from patient discharge 9/14.  Dr. Thedore Mins not on Amion.  Dr. Blake Divine up on floor and asked to provide prior authorization, stated she would get in touch with Dr. Thedore Mins to provide authorization.  Jonathan [name]

## 2016-11-03 ENCOUNTER — Telehealth (HOSPITAL_COMMUNITY): Payer: Self-pay | Admitting: *Deleted

## 2016-11-03 DIAGNOSIS — I5022 Chronic systolic (congestive) heart failure: Secondary | ICD-10-CM

## 2016-11-03 NOTE — Telephone Encounter (Signed)
Advanced Heart Failure Triage Encounter  Patient Name: Jonathan [name]  Date of Call: 11/03/16  Problem: leg/arm cramps  Zach with paramedicine called to report when speaking with patient over the phone he was complaining of bad leg cramps.  Patient stated he had taken an metolazone 2 days prior but the cramps started prior to that and have continued.    Plan:  Spoke with Maxine Glenn, PA and advises patient to come in for bmet and mag check.  In the meantime he can apply warm compresses as needed.  Patient is agreeable with plan and lab appointment scheduled tomorrow morning.    Georgina Peer, RN

## 2016-11-04 ENCOUNTER — Telehealth (HOSPITAL_COMMUNITY): Payer: Self-pay

## 2016-11-04 ENCOUNTER — Ambulatory Visit (HOSPITAL_COMMUNITY)
Admission: RE | Admit: 2016-11-04 | Discharge: 2016-11-04 | Disposition: A | Discharge status: Home / Self Care | Payer: BLUE CROSS/BLUE SHIELD | Source: Ambulatory Visit | Attending: Internal Medicine | Admitting: Internal Medicine

## 2016-11-04 DIAGNOSIS — I5022 Chronic systolic (congestive) heart failure: Secondary | ICD-10-CM | POA: Diagnosis present

## 2016-11-04 LAB — BASIC METABOLIC PANEL
ANION GAP: 15 (ref 5–15)
BUN: 21 mg/dL — AB (ref 6–20)
CHLORIDE: 92 mmol/L — AB (ref 101–111)
CO2: 27 mmol/L (ref 22–32)
Calcium: 9.6 mg/dL (ref 8.9–10.3)
Creatinine, Ser: 1.16 mg/dL (ref 0.61–1.24)
Glucose, Bld: 293 mg/dL — ABNORMAL HIGH (ref 65–99)
POTASSIUM: 4 mmol/L (ref 3.5–5.1)
SODIUM: 134 mmol/L — AB (ref 135–145)

## 2016-11-04 LAB — MAGNESIUM: MAGNESIUM: 1.9 mg/dL (ref 1.7–2.4)

## 2016-11-04 NOTE — Telephone Encounter (Signed)
I called Jonathan [name] today to confirm that he could see me as we discussed yesterday. He stated that he would like me to come at 19:00 because his wife would be home then however I told him that was not going to be possible due to the hours I work. He said he understood and cancelled our meeting and said he would let me know tomorrow if a time would work.

## 2016-11-04 NOTE — Telephone Encounter (Signed)
I called Jonathan [name] to schedule an appointment. After talking to him on the phone he stated that he would be able to meet tomorrow but then advised he was having muscle cramping throughout his body that began two days ago. He advised that he was started on torsemide while he was in the hospital and taken off furosemide. He was also prescribed metolazone which he used on Sunday. He said the cramping began two days ago however it was happening before taking the metolazone. I contacted the clinic who advised they would contact him to come in for lab work. I will follow up tomorrow as well.

## 2016-11-05 ENCOUNTER — Telehealth (HOSPITAL_COMMUNITY): Payer: Self-pay

## 2016-11-05 NOTE — Telephone Encounter (Signed)
I received a call from Mr Hochstatter regarding my voicemail. He stated he wanted his wife to be present when we met and I asked when she would be home today. He said he didn't think she would be home until after 17:00. I told him I wouldn't be able to meet that late and asked if we could meet next week sometime. He said he would call back in an hour and let me know. At present (14:36) I have not received a call back.

## 2016-11-05 NOTE — Telephone Encounter (Signed)
Left voicemail for Jonathan [name] to call me back in order to schedule an appointment for today as we discussed yesterday.

## 2016-11-08 ENCOUNTER — Encounter (HOSPITAL_COMMUNITY): Payer: Self-pay

## 2016-11-08 ENCOUNTER — Ambulatory Visit (HOSPITAL_COMMUNITY)
Admission: RE | Admit: 2016-11-08 | Discharge: 2016-11-08 | Disposition: A | Discharge status: Home / Self Care | Payer: BLUE CROSS/BLUE SHIELD | Source: Ambulatory Visit | Attending: Cardiology | Admitting: Cardiology

## 2016-11-08 VITALS — BP 136/104 | HR 115 | Wt 346.6 lb

## 2016-11-08 DIAGNOSIS — J45909 Unspecified asthma, uncomplicated: Secondary | ICD-10-CM | POA: Diagnosis not present

## 2016-11-08 DIAGNOSIS — J4531 Mild persistent asthma with (acute) exacerbation: Secondary | ICD-10-CM | POA: Diagnosis not present

## 2016-11-08 DIAGNOSIS — I110 Hypertensive heart disease with heart failure: Secondary | ICD-10-CM | POA: Insufficient documentation

## 2016-11-08 DIAGNOSIS — Z885 Allergy status to narcotic agent status: Secondary | ICD-10-CM | POA: Insufficient documentation

## 2016-11-08 DIAGNOSIS — E1169 Type 2 diabetes mellitus with other specified complication: Secondary | ICD-10-CM | POA: Diagnosis not present

## 2016-11-08 DIAGNOSIS — Z794 Long term (current) use of insulin: Secondary | ICD-10-CM | POA: Diagnosis not present

## 2016-11-08 DIAGNOSIS — G4733 Obstructive sleep apnea (adult) (pediatric): Secondary | ICD-10-CM | POA: Diagnosis not present

## 2016-11-08 DIAGNOSIS — Z833 Family history of diabetes mellitus: Secondary | ICD-10-CM | POA: Diagnosis not present

## 2016-11-08 DIAGNOSIS — E669 Obesity, unspecified: Secondary | ICD-10-CM | POA: Diagnosis not present

## 2016-11-08 DIAGNOSIS — Z6841 Body Mass Index (BMI) 40.0 and over, adult: Secondary | ICD-10-CM | POA: Insufficient documentation

## 2016-11-08 DIAGNOSIS — R0683 Snoring: Secondary | ICD-10-CM | POA: Diagnosis not present

## 2016-11-08 DIAGNOSIS — I10 Essential (primary) hypertension: Secondary | ICD-10-CM | POA: Diagnosis not present

## 2016-11-08 DIAGNOSIS — I429 Cardiomyopathy, unspecified: Secondary | ICD-10-CM | POA: Insufficient documentation

## 2016-11-08 DIAGNOSIS — I5022 Chronic systolic (congestive) heart failure: Secondary | ICD-10-CM | POA: Insufficient documentation

## 2016-11-08 DIAGNOSIS — Z7982 Long term (current) use of aspirin: Secondary | ICD-10-CM | POA: Insufficient documentation

## 2016-11-08 DIAGNOSIS — E119 Type 2 diabetes mellitus without complications: Secondary | ICD-10-CM | POA: Insufficient documentation

## 2016-11-08 MED ORDER — SACUBITRIL-VALSARTAN 49-51 MG PO TABS: 1.0000 | ORAL_TABLET | Freq: Two times a day (BID) | ORAL | 6 refills | Status: DC

## 2016-11-08 NOTE — Patient Instructions (Signed)
INCREASE Entresto to 49/51 mg twice daily. Elizabeth Palau CHF clinical pharmacist is working on your medication approval and will contact you for updates.  Will schedule you for sleep study at [facility]. Address: [address] [place], [place] [address] Phone: [phone]  ___________________________________________________  ___________________________________________________  Will refer you to Cardiac Rehab at [facility]. They will call you to set up initial appointment.  Will refer you to Internal Medicine Clinic for primary care. They will contact you by phone to schedule your initial appointment. [address] [place],  [place]  [address] 1st floor, [place] Main: [phone]  Fax: [phone] Follow up 2 weeks with Suzzette Righter NP-C.  ___________________________________________________  ___________________________________________________  Take all medication as prescribed the day of your appointment. Bring all medications with you to your appointment.  Do the following things EVERYDAY: 1) Weigh yourself in the morning before breakfast. Write it down and keep it in a log. 2) Take your medicines as prescribed 3) Eat low salt foods-Limit salt (sodium) to 2000 mg per day.  4) Stay as active as you can everyday 5) Limit all fluids for the day to less than 2 liters

## 2016-11-08 NOTE — Progress Notes (Signed)
Advanced Heart Failure Clinic Note    Primary Care: No PCP  Primary Cardiologist: Dr. Haroldine Laws   HPI: Jonathan [name] is a 31 y.o. male with history of systolic CHF due to NICM, HTN, DM 2, Asthma, OSA, and morbid obesity (Body mass index is 48.76 kg/m.)  Pt presented to [facility] 10/26/16 with progressive SOB x 2 weeks. He reports h/o systolic CHF with EF 51-88% by Echo in [place] x 1 year ago. Cath around that time without any significant CAD per patient. Recently went for a long period without any insurance, but recently re-obtained through his wifes work. He was diuresed with IV Lasix, discharge weight was 373 pounds. He had run out of most of his medications due to moving to [place] and not having insurance. Meds restarted at discharge: Coreg, Mearl Latin, Lasix.   He returns today for HF follow up. Weight at home is in the 340's. Likely that hospital weights were inaccurate. He feels well, denies SOB with walking around his house and stores. Denies orthopnea, chest pain, PND. He has not taken any medications today, says that he normally takes his first doses around 11 am. His BP and HR are elevated today, he does not take his BP at home. He is not wearing CPAP at night, he does not have his machine any longer.     Review of Systems: [y] = yes, [ ]  = no   General: Weight gain [ ] ; Weight loss [ y]; Anorexia [ ] ; Fatigue [ ] ; Fever [ ] ; Chills [ ] ; Weakness [ ]   Cardiac: Chest pain/pressure [ ] ; Resting SOB [ ] ; Exertional SOB [ ] ; Orthopnea [ ] ; Pedal Edema [ ] ; Palpitations [ ] ; Syncope [ ] ; Presyncope [ ] ; Paroxysmal nocturnal dyspnea[ ]   Pulmonary: Cough [ ] ; Wheezing[ ] ; Hemoptysis[ ] ; Sputum [ ] ; Snoring [ ]   GI: Vomiting[ ] ; Dysphagia[ ] ; Melena[ ] ; Hematochezia [ ] ; Heartburn[ ] ; Abdominal pain [ ] ; Constipation [ ] ; Diarrhea [ ] ; BRBPR [ ]   GU: Hematuria[ ] ; Dysuria [ ] ; Nocturia[ ]   Vascular: Pain in legs with walking [ ] ; Pain in feet with lying flat [ ] ; Non-healing sores [ ] ; Stroke [ ] ;  TIA [ ] ; Slurred speech [ ] ;  Neuro: Headaches[ ] ; Vertigo[ ] ; Seizures[ ] ; Paresthesias[ ] ;Blurred vision [ ] ; Diplopia [ ] ; Vision changes [ ]   Ortho/Skin: Arthritis [ ] ; Joint pain [ y]; Muscle pain [ ] ; Joint swelling [ ] ; Back Pain [ ] ; Rash [ ]   Psych: Depression[ ] ; Anxiety[ ]   Heme: Bleeding problems [ ] ; Clotting disorders [ ] ; Anemia [ ]   Endocrine: Diabetes [ ] ; Thyroid dysfunction[ ]    Past Medical History:  Diagnosis Date  . Asthma   . CHF (congestive heart failure) ([place])   . Hypertension   . Obesity     Current Outpatient Prescriptions  Medication Sig Dispense Refill  . albuterol (PROVENTIL HFA;VENTOLIN HFA) 108 (90 BASE) MCG/ACT inhaler Inhale 2 puffs into the lungs every 4 (four) hours as needed for wheezing. Dispense with aerochamber 1 Inhaler 0  . aspirin EC 81 MG tablet Take 81 mg by mouth daily.    . blood glucose meter kit and supplies KIT Dispense based on patient and insurance preference. Use up to four times daily as directed. (FOR ICD-9 250.00, 250.01). For QAC - HS accuchecks. May switch to any brand. 1 each 1  . carvedilol (COREG) 6.25 MG tablet Take 1 tablet (6.25 mg total) by mouth 2 (two) times daily with  a meal. 60 tablet 6  . Cholecalciferol (VITAMIN D) 2000 units tablet Take 2,000 Units by mouth daily.     . fluticasone furoate-vilanterol (BREO ELLIPTA) 200-25 MCG/INH AEPB Inhale 1 puff into the lungs daily.    Marland Kitchen ibuprofen (ADVIL,MOTRIN) 800 MG tablet Take 800 mg by mouth every 8 (eight) hours.    . insulin aspart (NOVOLOG) 100 UNIT/ML injection Before each meal 3 times a day, 140-199 - 2 units, 200-250 - 4 units, 251-299 - 6 units,  300-349 - 8 units,  350 or above 10 units. Dispense syringes and needles as needed, Ok to switch to PEN if approved. Substitute to any brand approved. DX DM2, Code E11.65 1 vial 0  . insulin glargine (LANTUS) 100 UNIT/ML injection Inject 0.3 mLs (30 Units total) into the skin at bedtime. Dispense insulin pen if approved, if not  dispense as needed syringes and needles for 1 month supply. Can switch to Levemir. Diagnosis E 11.65. 10 mL 0  . Insulin Syringe-Needle U-100 25G X 1" 1 ML MISC For 4 times a day insulin SQ, 1 month supply. Diagnosis E11.65 30 each 0  . metolazone (ZAROXOLYN) 2.5 MG tablet Take 1 tablet (2.5 mg total) by mouth as needed. As directed by HF clinic for weight gain of 3 lbs overnight or 5 lbs within one week. 10 tablet 2  . sacubitril-valsartan (ENTRESTO) 24-26 MG Take 1 tablet by mouth 2 (two) times daily. 60 tablet 3  . spironolactone (ALDACTONE) 25 MG tablet Take 1 tablet (25 mg total) by mouth daily. 30 tablet 6  . torsemide (DEMADEX) 20 MG tablet Take 2 tablets (40 mg total) by mouth 2 (two) times daily. 60 tablet 0   No current facility-administered medications for this encounter.     Allergies  Allergen Reactions  . Hydrocodone     Hives       Social History   Social History  . Marital status: Single    Spouse name: N/A  . Number of children: N/A  . Years of education: N/A   Occupational History  . Not on file.   Social History Main Topics  . Smoking status: Never Smoker  . Smokeless tobacco: Never Used  . Alcohol use No  . Drug use: No  . Sexual activity: No   Other Topics Concern  . Not on file   Social History Narrative  . No narrative on file      Family History  Problem Relation Age of Onset  . Diabetes Mellitus II Mother   . Congestive Heart Failure Neg Hx     Vitals:   11/08/16 1024  BP: (!) 136/104  Pulse: (!) 115  SpO2: 98%  Weight: (!) 346 lb 9.6 oz (157.2 kg)     PHYSICAL EXAM: General:  Obese male.  No respiratory difficulty HEENT: normal Neck: supple. no JVD. Carotids 2+ bilat; no bruits. No lymphadenopathy or thyromegaly appreciated. Cor: PMI nondisplaced. Regular rate & rhythm. No rubs, gallops or murmurs. Lungs: clear Abdomen: soft, nontender, nondistended. No hepatosplenomegaly. No bruits or masses. Good bowel sounds. Extremities:  no cyanosis, clubbing, rash, trace pedal edema bilaterally.  Neuro: alert & oriented x 3, cranial nerves grossly intact. moves all 4 extremities w/o difficulty. Affect pleasant.  ECG: 11/09/16 personally reviewed: ST, LAFB   ASSESSMENT & PLAN:    1. Chronic systolic CHF due to NICM, normal cors by cath in [place] in 2017. Echo 10/2016 EF 30-35%.  - NYHA II - Volume stable on exam. -  Continue torsemide 40 mg BID. - Continue metolazone prn.  - Increase Entresto to 49/51 mg BID. He has not taken his medications today, but was on this dose prior to admission. I have asked him to take his BP at home or call if he develops dizziness/lightheadedness.  - Continue Coreg 6.25 mg BID.  - Continue spiro 25 mg daily.  - Orders have been placed to obtain medical records from [facility] center in [place], [place] - Reinforced fluid restriction to < 2 L daily, sodium restriction to less than 2000 mg daily, and the importance of daily weights.   - Refer to cardiac rehab, he wants a regular exercise program.   2. HTN - I have asked him to be sure to take his medications prior to coming to next visit.   3. DM2  - Hgb A1c is 10.8.  - Referred to Internal Medicine Clinic for PCP.   4. Morbid Obesity - Body mass index is 44.5 kg/m. - Refer to cardiac rehab.   5. OSA/OHS - Sleep study ordered. Has previous diagnosis of OSA, but no longer has CPAP.   Follow up in 2 weeks, BMET in 10 days after increased Entresto.    Arbutus Leas, NP 11/08/16

## 2016-11-09 ENCOUNTER — Other Ambulatory Visit (HOSPITAL_COMMUNITY): Payer: Self-pay

## 2016-11-09 NOTE — Progress Notes (Signed)
Paramedicine Encounter    Patient ID: Jonathan [name], male    DOB: 1986/01/28, 31 y.o.   MRN: [id number]   Patient Care Team: Patient, No Pcp Per as PCP - General (General Practice)  Patient Active Problem List   Diagnosis Date Noted  . Acute on chronic systolic (congestive) heart failure ([place])   . Acute exacerbation of CHF (congestive heart failure) ([place]) 10/26/2016  . Obesity, Class III, BMI 40-49.9 (morbid obesity) ([place]) 10/26/2016  . Asthma, chronic, mild persistent, with acute exacerbation 10/26/2016  . Hypertensive urgency 10/26/2016  . NSTEMI (non-ST elevated myocardial infarction) ([place]) 10/26/2016  . SIRS (systemic inflammatory response syndrome) ([place]) 04/20/2016  . Acute respiratory failure with hypoxia ([place]) 04/20/2016  . Congestive heart failure (CHF) ([place]) 04/20/2016  . Diabetes mellitus type 2 in obese ([place]) 04/20/2016    Current Outpatient Prescriptions:  .  albuterol (PROVENTIL HFA;VENTOLIN HFA) 108 (90 BASE) MCG/ACT inhaler, Inhale 2 puffs into the lungs every 4 (four) hours as needed for wheezing. Dispense with aerochamber, Disp: 1 Inhaler, Rfl: 0 .  carvedilol (COREG) 6.25 MG tablet, Take 1 tablet (6.25 mg total) by mouth 2 (two) times daily with a meal., Disp: 60 tablet, Rfl: 6 .  fluticasone furoate-vilanterol (BREO ELLIPTA) 200-25 MCG/INH AEPB, Inhale 1 puff into the lungs daily., Disp: , Rfl:  .  ibuprofen (ADVIL,MOTRIN) 800 MG tablet, Take 800 mg by mouth every 8 (eight) hours., Disp: , Rfl:  .  metolazone (ZAROXOLYN) 2.5 MG tablet, Take 1 tablet (2.5 mg total) by mouth as needed. As directed by HF clinic for weight gain of 3 lbs overnight or 5 lbs within one week., Disp: 10 tablet, Rfl: 2 .  sacubitril-valsartan (ENTRESTO) 49-51 MG, Take 1 tablet by mouth 2 (two) times daily., Disp: 60 tablet, Rfl: 6 .  spironolactone (ALDACTONE) 25 MG tablet, Take 1 tablet (25 mg total) by mouth daily., Disp: 30 tablet, Rfl: 6 .  torsemide (DEMADEX) 20 MG tablet, Take 2 tablets (40  mg total) by mouth 2 (two) times daily., Disp: 60 tablet, Rfl: 0 .  aspirin EC 81 MG tablet, Take 81 mg by mouth daily., Disp: , Rfl:  .  blood glucose meter kit and supplies KIT, Dispense based on patient and insurance preference. Use up to four times daily as directed. (FOR ICD-9 250.00, 250.01). For QAC - HS accuchecks. May switch to any brand. (Patient not taking: Reported on 11/09/2016), Disp: 1 each, Rfl: 1 .  Cholecalciferol (VITAMIN D) 2000 units tablet, Take 2,000 Units by mouth daily. , Disp: , Rfl:  .  insulin aspart (NOVOLOG) 100 UNIT/ML injection, Before each meal 3 times a day, 140-199 - 2 units, 200-250 - 4 units, 251-299 - 6 units,  300-349 - 8 units,  350 or above 10 units. Dispense syringes and needles as needed, Ok to switch to PEN if approved. Substitute to any brand approved. DX DM2, Code E11.65 (Patient not taking: Reported on 11/09/2016), Disp: 1 vial, Rfl: 0 .  insulin glargine (LANTUS) 100 UNIT/ML injection, Inject 0.3 mLs (30 Units total) into the skin at bedtime. Dispense insulin pen if approved, if not dispense as needed syringes and needles for 1 month supply. Can switch to Levemir. Diagnosis E 11.65. (Patient not taking: Reported on 11/09/2016), Disp: 10 mL, Rfl: 0 .  Insulin Syringe-Needle U-100 25G X 1" 1 ML MISC, For 4 times a day insulin SQ, 1 month supply. Diagnosis E11.65 (Patient not taking: Reported on 11/09/2016), Disp: 30 each, Rfl: 0 Allergies  Allergen Reactions  .  Hydrocodone     Hives      Social History   Social History  . Marital status: Single    Spouse name: N/A  . Number of children: N/A  . Years of education: N/A   Occupational History  . Not on file.   Social History Main Topics  . Smoking status: Never Smoker  . Smokeless tobacco: Never Used  . Alcohol use No  . Drug use: No  . Sexual activity: No   Other Topics Concern  . Not on file   Social History Narrative  . No narrative on file    Physical Exam  Constitutional: He is  oriented to person, place, and time.  Cardiovascular: Normal rate and regular rhythm.   Pulmonary/Chest: Effort normal and breath sounds normal. No respiratory distress. He has no wheezes. He has no rales.  Abdominal: Soft.  Musculoskeletal: Normal range of motion. He exhibits edema.  Neurological: He is alert and oriented to person, place, and time.  Skin: Skin is warm and dry.  Psychiatric: He has a normal mood and affect.        SA[place] - 11/09/16 1200      Situation   Admitting diagnosis chf   Heart failure history Exisiting   Comorbidities DM;HTN;Sleep Apnea   Readmitted within 30 days No   Hospital admission within past 12 months Yes   number of hospital admissions 1   number of ED visits 2     Assessment   Lives alone No   Primary support person Wife   Mode of transportation public bus   Other services involved None   Home equipement Scale     Weight   Weighs self daily Yes   Scale provided No   Records on weight chart No     Resources   Has "Living better w/heart failure" book Yes   Has HF Zone tool Yes   Able to identify yellow zone signs/when to call MD Yes   Records zone daily No     Medications   Uses a pill box No   Difficulty obtaining medications Yes   Barriers to medication financial   Community Paramedic obtains medications from pharmacy No   Mail order medications No   Missed one or more doses of medications per week No     Nutrition   Patient receives meals on wheels No   Patient follows low sodium diet Yes   Has foods at home that meet the current recommended diet Yes   Patient follows low sugar/card diet Yes   Nutritional concerns/issues no     Activity Level   ADL's/Mobility Independent   How many feet can patient ambulate 0.5 mile   Typical activity level active     Urine   Difficulty urinating No   Changes in urine None     Time spent with patient   Time spent with patient  [place] - 11/09/16 1100      Outside of House   Sidewalk and pathway to house is level and free from any hazards Yes   Driveway is free from debris/snow/ice Yes   Outside stairs are stable and have sturdy handrail Yes   Porch lights are working and provide adequate lighting Yes     Living Room   Furniture is of adequate height and offers arm rests that assist in getting up and down Yes   Floor is  free from any clutter that would create tripping hazards Yes   All cords are either behind furniture or secured in a manner that does not cause trip hazards Yes   All rugs are secured to floor with double-sided tape N/A   Lighting is adequate to light room Yes   All lighting has an easily accessible on/off switch Yes   Phone is readily accessible near favorite seating areas Yes   Emergency numbers are printed near all phones in house No     Kitchen   Items used most often are within easy reach on low shelves Yes   Step stool is present, is sturdy and has a handrail No   Floor mats are non-slip tread and secured to floor N/A   Oven controls are within easy reach Yes   Kitchen lighting is adequate and easy to reach switches Yes   ABC fire extinguisher is located in kitchen No     [place] is properly secured to stairs and/or all wood is properly secured N/A   Handrail is present and sturdy N/A   Stairs are free from any clutter N/A   Stairway is adequately lit N/A     Bathroom   Tub and shower have a non-slip surface Yes   Tub and/or shower have a grab bar for stability No   Toilet has a raised seat No   Grab bar is attached near toilet for assistance No   Pathway from bedroom to bathroom is free from clutter and well lit for ease of movement in the middle of the night Yes     Bedroom   Floor is free from clutter Yes   Light is near bed and is easy to turn on Yes   Phone is next to bed and within easy reach Yes   Flashlight is near bed in case of emergency Yes      General   Smoke detectors in all areas of the house (each floor) and tested Yes   CO detectors on each floor of house and tested Yes   Flashlights are handy throughout the home Yes   Resident has all medical information readily available and in an area emergency providers will easily find Yes   All heaters are away from any type of flammable material N/A     Overall Tips   Homeowner ha good non-skid shoes to move around house Yes   All assisted walking devices are readily accessible and in good condition N/A   There is a phone near the floor for ease of reach in case of a fall YES   All O2 tubing is less than 50 ft. and is not a trip hazard N/A   Resident has had an annual hearing and vision check by a physician No   Resident has the proper hearing and visual aids prescribed and are in good working order N/A   All medications are properly stored and labeled to avoid confusion on dosage, time to take, and avoidance of missed doses Yes       Future Appointments Date Time Provider [place]  12/02/2016 11:30 AM [facility] PA/NP [facility] None  12/20/2016 8:00 PM [facility]OM 7 [facility]   BP 120/82 (BP Location: Left Arm, Patient Position: Sitting, Cuff Size: Large)   Pulse (!) 102   Resp 16   Wt (!) 340 lb (154.2 kg)   SpO2 98%   BMI 43.65 kg/m  Weight yesterday- 342.6 lbs Last visit weight-  N/A   Mr Muzquiz was seen at home today for his initial visit. He reports feeling well but did say he had some SOB with exertion and intermittent dizziness. He has not been taking his insulin because it makes him feel jittery. He does not have a primary care physician but plans on moving soon and when he does we will find a physician close to his house do to transportation issues. Medications were verified. He does not use a pillbox at present but wants to have one. I will bring one by next week.  Time spent with patient: 46 minutes  [name], EMT 11/09/16  ACTION: Home  visit completed Next visit planned for 1 week

## 2016-11-15 ENCOUNTER — Telehealth (HOSPITAL_COMMUNITY): Payer: Self-pay

## 2016-11-15 NOTE — Telephone Encounter (Signed)
Verified insurance. No co-payment, deductible amount is $5,500/$5,500 is met, out of pocket amount is $6,850/$6,850 is met with a 25% co-insurance, no pre-authorization is required. Passport/reference # [id number]

## 2016-11-16 ENCOUNTER — Telehealth (HOSPITAL_COMMUNITY): Payer: Self-pay

## 2016-11-16 NOTE — Telephone Encounter (Signed)
I called and spoke to patient about scheduling for cardiac rehab. Patient stated that he was interested in cardiac rehab, but did not want to schedule at this time. Patient stated he is in the process of moving. Patient asked that I call him on Friday, anytime during the day. I confirmed with patient.

## 2016-11-17 ENCOUNTER — Encounter (HOSPITAL_COMMUNITY): Payer: Self-pay

## 2016-11-17 NOTE — Progress Notes (Signed)
Jonathan [name] FMLA for patient's spouse completed and signed by Dr. Gala Romney for intermittent leave as needed for flare ups and appointments. Faxed to provided # [phone] (13 pages total) Copy of paperwork scanned into patient's electronic medical record under media tab.  Jonathan [name]

## 2016-11-18 NOTE — Telephone Encounter (Signed)
Mr Bergsma was called to schedule an appointment. I left a voicemail with my information and why I was calling and requested he call me back.

## 2016-11-19 ENCOUNTER — Telehealth (HOSPITAL_COMMUNITY): Payer: Self-pay

## 2016-11-19 NOTE — Telephone Encounter (Signed)
I called Jonathan [name] today to try and schedule an appointment. He stated that he was going to be away from his house today and would not be able to meet. I told him I would be gone next week but I would see if either [name] or [name] would try and meet him. He agreed to this.

## 2016-11-29 ENCOUNTER — Telehealth (HOSPITAL_COMMUNITY): Payer: Self-pay

## 2016-11-29 NOTE — Telephone Encounter (Signed)
Called patient to discuss Cardiac rehab. Patient asked to call back on Wednesday 12/01/16

## 2016-11-30 ENCOUNTER — Encounter: Payer: Self-pay | Admitting: Dietician

## 2016-11-30 ENCOUNTER — Ambulatory Visit: Payer: BLUE CROSS/BLUE SHIELD

## 2016-11-30 ENCOUNTER — Ambulatory Visit (INDEPENDENT_AMBULATORY_CARE_PROVIDER_SITE_OTHER): Payer: BLUE CROSS/BLUE SHIELD | Admitting: Internal Medicine

## 2016-11-30 VITALS — BP 130/81 | HR 81 | Temp 98.0°F | Ht 74.0 in | Wt 345.5 lb

## 2016-11-30 DIAGNOSIS — Z6841 Body Mass Index (BMI) 40.0 and over, adult: Secondary | ICD-10-CM

## 2016-11-30 DIAGNOSIS — R0602 Shortness of breath: Secondary | ICD-10-CM

## 2016-11-30 DIAGNOSIS — R05 Cough: Secondary | ICD-10-CM | POA: Diagnosis not present

## 2016-11-30 DIAGNOSIS — J4530 Mild persistent asthma, uncomplicated: Secondary | ICD-10-CM

## 2016-11-30 DIAGNOSIS — I5023 Acute on chronic systolic (congestive) heart failure: Secondary | ICD-10-CM

## 2016-11-30 DIAGNOSIS — I10 Essential (primary) hypertension: Secondary | ICD-10-CM

## 2016-11-30 DIAGNOSIS — R51 Headache: Secondary | ICD-10-CM | POA: Diagnosis not present

## 2016-11-30 DIAGNOSIS — E669 Obesity, unspecified: Secondary | ICD-10-CM | POA: Diagnosis not present

## 2016-11-30 DIAGNOSIS — I110 Hypertensive heart disease with heart failure: Secondary | ICD-10-CM

## 2016-11-30 DIAGNOSIS — E119 Type 2 diabetes mellitus without complications: Secondary | ICD-10-CM | POA: Diagnosis not present

## 2016-11-30 DIAGNOSIS — R0601 Orthopnea: Secondary | ICD-10-CM | POA: Diagnosis not present

## 2016-11-30 DIAGNOSIS — J4531 Mild persistent asthma with (acute) exacerbation: Secondary | ICD-10-CM

## 2016-11-30 DIAGNOSIS — H538 Other visual disturbances: Secondary | ICD-10-CM | POA: Diagnosis not present

## 2016-11-30 DIAGNOSIS — I160 Hypertensive urgency: Secondary | ICD-10-CM

## 2016-11-30 DIAGNOSIS — E1169 Type 2 diabetes mellitus with other specified complication: Secondary | ICD-10-CM

## 2016-11-30 DIAGNOSIS — M791 Myalgia, unspecified site: Secondary | ICD-10-CM | POA: Diagnosis not present

## 2016-11-30 DIAGNOSIS — I5022 Chronic systolic (congestive) heart failure: Secondary | ICD-10-CM

## 2016-11-30 DIAGNOSIS — Z79899 Other long term (current) drug therapy: Secondary | ICD-10-CM

## 2016-11-30 LAB — GLUCOSE, CAPILLARY: GLUCOSE-CAPILLARY: 404 mg/dL — AB (ref 65–99)

## 2016-11-30 LAB — POCT GLYCOSYLATED HEMOGLOBIN (HGB A1C): Hemoglobin A1C: 13.5

## 2016-11-30 MED ORDER — SITAGLIPTIN PHOS-METFORMIN HCL 50-1000 MG PO TABS: 1.0000 | ORAL_TABLET | Freq: Two times a day (BID) | ORAL | 1 refills | Status: DC

## 2016-11-30 NOTE — Progress Notes (Signed)
Met with patient and significant other briefly per request of nurse today. Introduced myself and concept of diabetes training to improve diabetes self management skills and self efficacy. Recommend referral for same.  Phone number provided for questions or concerns

## 2016-11-30 NOTE — Patient Instructions (Addendum)
It was a pleasure to see you today [name]. During this visit we spoke about the following:  -Continue all your medication -Start taking janumet twice daily -Follow up in 1 month -Schedule appointment for nutrition -Please do aerobic exercise at least per day  Thank you,  Lorenso Courier, MD Internal Medicine PGY1

## 2016-11-30 NOTE — Progress Notes (Signed)
   CC: Diabetes follow-up  HPI:  [name] is a 31 y.o. with pmh of chf, diabetes mellitus, htn, and asthma who presents for diabetes follow up. The patient recently moved from [place] last year and he has not seen a primary care provider in a year. He has been hospitalized several times over the past year for heart failure exacerbations. Please see assessment and plan for additional details.  Past Medical History:  Diagnosis Date  . Asthma   . CHF (congestive heart failure) ([facility])   . Hypertension   . Obesity    Review of Systems:    Review of Systems  Constitutional: Positive for weight loss. Negative for chills and fever.  Eyes: Positive for blurred vision.  Respiratory: Positive for cough (dry) and shortness of breath. Negative for wheezing.   Cardiovascular: Positive for orthopnea. Negative for chest pain, palpitations and leg swelling.  Gastrointestinal: Negative for abdominal pain, constipation, diarrhea, nausea and vomiting.  Musculoskeletal: Positive for myalgias.  Neurological: Positive for headaches (occasional). Negative for dizziness and sensory change.     Physical Exam:  Vitals:   11/30/16 0949  BP: (!) 143/69  Pulse: (!) 106  Temp: 98 F (36.7 C)  TempSrc: Oral  SpO2: 98%  Weight: (!) 345 lb 8 oz (156.7 kg)  Height: 6\' 2"  (1.88 m)   Physical Exam  Constitutional: He appears well-developed and well-nourished.  HENT:  Head: Normocephalic and atraumatic.  Cardiovascular: Normal rate and normal heart sounds.   Pulmonary/Chest: Effort normal and breath sounds normal. No respiratory distress. He has no wheezes.  Abdominal: Soft. Bowel sounds are normal. He exhibits no distension. There is no tenderness.  Psychiatric: He has a normal mood and affect. His behavior is normal. Judgment and thought content normal.    Assessment & Plan:   See Encounters Tab for problem based charting.  Patient seen with Dr. Rogelia Boga

## 2016-12-01 LAB — BMP8+ANION GAP
Anion Gap: 18 mmol/L (ref 10.0–18.0)
BUN/Creatinine Ratio: 15 (ref 9–20)
BUN: 13 mg/dL (ref 6–20)
CO2: 27 mmol/L (ref 20–29)
CREATININE: 0.89 mg/dL (ref 0.76–1.27)
Calcium: 9.7 mg/dL (ref 8.7–10.2)
Chloride: 92 mmol/L — ABNORMAL LOW (ref 96–106)
GFR calc Af Amer: 132 mL/min/{1.73_m2} (ref >59)
GFR calc non Af Amer: 114 mL/min/{1.73_m2} (ref >59)
Glucose: 356 mg/dL — ABNORMAL HIGH (ref 65–99)
Potassium: 4.7 mmol/L (ref 3.5–5.2)
Sodium: 137 mmol/L (ref 134–144)

## 2016-12-01 NOTE — Telephone Encounter (Signed)
Attempted to call patient in regards to patient asking for Cardiac Rehab to call back on Wednesday - no answer - no voicemail. Will f/u

## 2016-12-01 NOTE — Assessment & Plan Note (Signed)
The patient was first diagnosed with diabetes 2 years ago when he was precribed insulin. The patient stopped taking insulin because he was having seizures and was jittery. He was then prescribed metformin and stopped taking it on October 2017 due to unknown side effects. The patient is currently not on any anti-hyperglycemic agents. His random glucose today was 404 and his HbA1C was 13.5.  Discussed insulin options, but the patient stated that he is afraid of any needles or injectable drugs. Therefore, we started the patient on the maximum dose of Janumet and encouraged lifestyle modifications. The patient appeared to be motivated to lose weight and said he has successfully done so in the past.   -Started Janumet 50-1000mg  bid -Will schedule appointment with Lupita Leash to discuss nutrition -Follow up in one month -24 hour glucose monitoring

## 2016-12-01 NOTE — Assessment & Plan Note (Signed)
The patient has albuterol inhaler and breo inhaler. The patient states that he does not use his breo inhaler daily. The patient has only had to use the albuterol once this past month.   -Continue albuterol as needed -No need to refill breo at this time

## 2016-12-01 NOTE — Assessment & Plan Note (Signed)
The patient's last echo done 10/27/16 showed a LVEF of 30-35%, diffuse hypokinesis, and moderate concentric hypertrophy. The patient is currently taking spironolactone 25mg  qd, metolazone 2.5mg  prn, torsemide 20mg  bid  The patient can walk approximately a mile prior to getting short of breath, however previouly he had difficulty even walking to the bedroom to the bathroom. The patient is compliant with all his medication.   The patient was recently hosptialized at [facility] from 10/26/16-10/29/16 for a chf exacerbation due to noncompliance with medication and poor blood pressure control. The patient's chf was managed with lasix, zaroxolyn, and aldactone along with coreg, entresto and imdur.   -Follow up with scheduled cardiology appointment on 11/02/16 -Continue medication

## 2016-12-01 NOTE — Assessment & Plan Note (Signed)
The patient's blood pressure during this visit was 143/69 and repeat measurement was 130/81. The patient is currently on entresto 49-51mg  bid and carvedilol 6.25mg  bid  -Continue entresto and carvedilol and will re-assess need for medication change at next visit as the repeat bp measurement was normal.

## 2016-12-02 ENCOUNTER — Other Ambulatory Visit (HOSPITAL_COMMUNITY): Payer: Self-pay

## 2016-12-02 ENCOUNTER — Ambulatory Visit (HOSPITAL_COMMUNITY)
Admission: RE | Admit: 2016-12-02 | Discharge: 2016-12-02 | Disposition: A | Discharge status: Home / Self Care | Payer: BLUE CROSS/BLUE SHIELD | Source: Ambulatory Visit | Attending: Cardiology | Admitting: Cardiology

## 2016-12-02 ENCOUNTER — Encounter (HOSPITAL_COMMUNITY): Payer: Self-pay

## 2016-12-02 VITALS — BP 130/79 | HR 90 | Wt 340.5 lb

## 2016-12-02 DIAGNOSIS — E662 Morbid (severe) obesity with alveolar hypoventilation: Secondary | ICD-10-CM | POA: Diagnosis not present

## 2016-12-02 DIAGNOSIS — I5020 Unspecified systolic (congestive) heart failure: Secondary | ICD-10-CM

## 2016-12-02 DIAGNOSIS — J45909 Unspecified asthma, uncomplicated: Secondary | ICD-10-CM | POA: Insufficient documentation

## 2016-12-02 DIAGNOSIS — E1140 Type 2 diabetes mellitus with diabetic neuropathy, unspecified: Secondary | ICD-10-CM | POA: Diagnosis not present

## 2016-12-02 DIAGNOSIS — G4733 Obstructive sleep apnea (adult) (pediatric): Secondary | ICD-10-CM | POA: Diagnosis not present

## 2016-12-02 DIAGNOSIS — E1169 Type 2 diabetes mellitus with other specified complication: Secondary | ICD-10-CM | POA: Diagnosis not present

## 2016-12-02 DIAGNOSIS — Z79899 Other long term (current) drug therapy: Secondary | ICD-10-CM | POA: Diagnosis not present

## 2016-12-02 DIAGNOSIS — I10 Essential (primary) hypertension: Secondary | ICD-10-CM

## 2016-12-02 DIAGNOSIS — I110 Hypertensive heart disease with heart failure: Secondary | ICD-10-CM | POA: Insufficient documentation

## 2016-12-02 DIAGNOSIS — Z841 Family history of disorders of kidney and ureter: Secondary | ICD-10-CM | POA: Insufficient documentation

## 2016-12-02 DIAGNOSIS — Z833 Family history of diabetes mellitus: Secondary | ICD-10-CM | POA: Insufficient documentation

## 2016-12-02 DIAGNOSIS — E669 Obesity, unspecified: Secondary | ICD-10-CM

## 2016-12-02 DIAGNOSIS — Z6841 Body Mass Index (BMI) 40.0 and over, adult: Secondary | ICD-10-CM | POA: Diagnosis not present

## 2016-12-02 DIAGNOSIS — Z7951 Long term (current) use of inhaled steroids: Secondary | ICD-10-CM | POA: Diagnosis not present

## 2016-12-02 DIAGNOSIS — I5022 Chronic systolic (congestive) heart failure: Secondary | ICD-10-CM | POA: Insufficient documentation

## 2016-12-02 DIAGNOSIS — I428 Other cardiomyopathies: Secondary | ICD-10-CM | POA: Insufficient documentation

## 2016-12-02 DIAGNOSIS — Z885 Allergy status to narcotic agent status: Secondary | ICD-10-CM | POA: Diagnosis not present

## 2016-12-02 DIAGNOSIS — Z8249 Family history of ischemic heart disease and other diseases of the circulatory system: Secondary | ICD-10-CM | POA: Insufficient documentation

## 2016-12-02 HISTORY — DX: Obstructive sleep apnea (adult) (pediatric): G47.33

## 2016-12-02 MED ORDER — CARVEDILOL 12.5 MG PO TABS: 12.5000 mg | ORAL_TABLET | Freq: Two times a day (BID) | ORAL | 6 refills | Status: DC

## 2016-12-02 NOTE — Patient Instructions (Signed)
INCREASE Carvedilol (Coreg) to 12.5 mg twice daily. May double up on current 6.25 mg tablets you have at home (Take 2 tabs twice daily). New Rx has been sent to your pharmacy (Take 1 tab twice daily).  Follow up 4-6 weeks with Otilio Saber PA-C.  ___________________________________________________________________ [place] Code: 8001  Take all medication as prescribed the day of your appointment. Bring all medications with you to your appointment.  Do the following things EVERYDAY: 1) Weigh yourself in the morning before breakfast. Write it down and keep it in a log. 2) Take your medicines as prescribed 3) Eat low salt foods-Limit salt (sodium) to 2000 mg per day.  4) Stay as active as you can everyday 5) Limit all fluids for the day to less than 2 liters

## 2016-12-02 NOTE — Progress Notes (Signed)
Paramedicine CSW Initial Assessment  Jonathan [name] is enrolled in the [employer] Program through [facility].  The patient presents today in association with an Advanced Heart Failure Clinic Appointment.  Patient seen today by CSW for follow up/assistance on Financial difficulties.   Social Determinants impacting successful heart failure regimen:  Housing: patient lives with with wife. Food: Do you have enough food? No  Do you know and understand healthy eating and how that affects your heart failure diagnosis? Yes  Do you follow a low salt diet?  Yes  Utilities: Do you have gas and/or electricity on in your home? Yes  Income: What is your source of income? None- denied by Social Security Insurance: BC/BS- wife works at [employer] and carries Administrator: Do you have transportation to your medical appointments?Yes  If yes, how? wife    Daily Health Needs: Do you have a scale and weigh each day?  Yes  Are you able to adhere to your medication regimen? Yes  Do you ever take medications differently than prescribed? No  Do you know the zones of Heart Failure?  Yes  Do you know how to contact the HF Clinic appropriately with worsening symptoms or weight increases?  Yes   Do you have an Advanced Directive?  No  If not, would you like to complete?  Do you have any identified obstacles / challenges for adherence to current treatment plan?Yes  Patient identifies food and income as barriers.  Patient appears in good spirits despite multiple challenges. Patient will follow up with Home billing regarding pending medical bills and was provided food resources to obtain needed food. CSW will explore options for Social Security re application as he was denied and past appeal process.   CSW assisted patient with Engineer, mining with the goal to Increase adequate support systems for patient/family  [facility] Paramed[facility]  Program and Heart Failure Clinic will continue to coordinate and monitor patient's treatment plan. CSW continues to be available for identified needs. Lasandra Beech, LCSW, CCSW-MCS [phone]

## 2016-12-02 NOTE — Addendum Note (Signed)
Encounter addended by: Marcy Siren, LCSW on: 12/02/2016  3:02 PM<BR>    Actions taken: Sign clinical note

## 2016-12-02 NOTE — Progress Notes (Signed)
Paramedicine Encounter   Patient ID: Jonathan [name] , male,   DOB: 14-Mar-1985,31 y.o.,  MRN: [id number]   Jonathan [name] was seen at the clinic today and reports feeling well. Only change today was increasing carvedilol to 12.5 mg BID. He and his wife has moved to a new house.   Time spent with patient: 23 minutes  Jacqualine Code, EMT 12/02/2016   ACTION: Home visit completed Next visit planned for 1 week

## 2016-12-02 NOTE — Progress Notes (Signed)
Advanced Heart Failure Clinic Note    Primary Care: No PCP  Primary Cardiologist: Dr. Haroldine Laws   HPI: Jonathan [name] is a 31 y.o. male with history of systolic CHF due to NICM, HTN, DM 2, Asthma, OSA, and morbid obesity (Body mass index is 48.76 kg/m.)  Pt presented to [facility] 10/26/16 with progressive SOB x 2 weeks. He reports h/o systolic CHF with EF 46-56% by Echo in [place] x 1 year ago. Cath around that time without any significant CAD per patient. Recently went for a long period without any insurance, but recently re-obtained through his wifes work. He was diuresed with IV Lasix, discharge weight was 373 pounds. He had run out of most of his medications due to moving to [place] and not having insurance. Meds restarted at discharge: Coreg, Mearl Latin, Lasix.   He presents today regular follow up. He is feeling well overall. Denies SOB walking around on flat ground or ADLs. He has chronic orthopnea, but sleeps flat in bed, usually on his back or sides (suspect significant sleep apnea).  Has been more compliant with his medications. He denies CP, lightheadedness, or dizziness. He has not had a sleep study yet. Has not started cardiac rehab as he just finished moving this week.   Labs 11/30/16 K 4.7, Cr 0.89  Review of systems complete and found to be negative unless listed in HPI.    Past Medical History:  Diagnosis Date  . Asthma   . CHF (congestive heart failure) ([place])   . Hypertension   . Obesity   . Type 2 diabetes mellitus with diabetic neuropathy [facility])     Current Outpatient Prescriptions  Medication Sig Dispense Refill  . albuterol (PROVENTIL HFA;VENTOLIN HFA) 108 (90 BASE) MCG/ACT inhaler Inhale 2 puffs into the lungs every 4 (four) hours as needed for wheezing. Dispense with aerochamber 1 Inhaler 0  . blood glucose meter kit and supplies KIT Dispense based on patient and insurance preference. Use up to four times daily as directed. (FOR ICD-9 250.00, 250.01). For QAC - HS  accuchecks. May switch to any brand. 1 each 1  . carvedilol (COREG) 6.25 MG tablet Take 1 tablet (6.25 mg total) by mouth 2 (two) times daily with a meal. 60 tablet 6  . fluticasone furoate-vilanterol (BREO ELLIPTA) 200-25 MCG/INH AEPB Inhale 1 puff into the lungs daily.    Marland Kitchen ibuprofen (ADVIL,MOTRIN) 800 MG tablet Take 800 mg by mouth every 8 (eight) hours.    . metolazone (ZAROXOLYN) 2.5 MG tablet Take 1 tablet (2.5 mg total) by mouth as needed. As directed by HF clinic for weight gain of 3 lbs overnight or 5 lbs within one week. 10 tablet 2  . sacubitril-valsartan (ENTRESTO) 49-51 MG Take 1 tablet by mouth 2 (two) times daily. 60 tablet 6  . spironolactone (ALDACTONE) 25 MG tablet Take 1 tablet (25 mg total) by mouth daily. 30 tablet 6  . torsemide (DEMADEX) 20 MG tablet Take 20 mg by mouth 2 (two) times daily.     No current facility-administered medications for this encounter.     Allergies  Allergen Reactions  . Hydrocodone     Hives       Social History   Social History  . Marital status: Single    Spouse name: N/A  . Number of children: N/A  . Years of education: N/A   Occupational History  . Not on file.   Social History Main Topics  . Smoking status: Never Smoker  . Smokeless tobacco:  Never Used  . Alcohol use No  . Drug use: No  . Sexual activity: No   Other Topics Concern  . Not on file   Social History Narrative  . No narrative on file      Family History  Problem Relation Age of Onset  . Diabetes Mellitus II Mother   . Heart failure Mother   . Hypertension Mother   . Heart disease Mother   . Heart failure Father   . Kidney disease Father   . Heart disease Father   . Congestive Heart Failure Neg Hx     Vitals:   12/02/16 1127  BP: 130/79  Pulse: 90  SpO2: 100%  Weight: (!) 340 lb 8 oz (154.4 kg)   Wt Readings from Last 3 Encounters:  12/02/16 (!) 340 lb 8 oz (154.4 kg)  11/30/16 (!) 345 lb 8 oz (156.7 kg)  11/09/16 (!) 340 lb (154.2 kg)      PHYSICAL EXAM: General: Well appearing. No resp difficulty. HEENT: Normal Neck: Supple. Thick, JVP difficult but appears 6-7 cm. Carotids 2+ bilat; no bruits. No thyromegaly or nodule noted. Cor: PMI nondisplaced. RRR, No M/G/R noted Lungs: CTAB, normal effort. Abdomen: Obese, soft, non-tender, non-distended, no HSM. No bruits or masses. +BS  Extremities: No cyanosis, clubbing, or rash. R and LLE no edema.  Neuro: Alert & orientedx3, cranial nerves grossly intact. moves all 4 extremities w/o difficulty. Affect pleasant   ECG: 11/09/16 personally reviewed: ST, LAFB  ASSESSMENT & PLAN:    1. Chronic systolic CHF due to NICM, normal cors by cath in [place] in 2017. Echo 10/2016 EF 30-35%.  - NYHA II - Volume status stable on exam.  - Continue torsemide 20 mg BID. - Continue metolazone prn.  - Continue Entresto 49/51 mg BID. K 4.7 11/30/16 so will not increase at this time.  - Increase coreg to 12.5 mg BID.  - Continue spiro 25 mg daily.  - Orders have been placed to obtain medical records from [facility] center in [place], [place] - Reinforced fluid restriction to < 2 L daily, sodium restriction to less than 2000 mg daily, and the importance of daily weights.    - He has referred to cardiac rehab, he wants a regular exercise program.   2. HTN - Meds as above.   3. DM2  - Hgb A1c is 10.8.  - Now following with Dr. Maricela Bo as PCP. Diabetes medications adjusted 11/30/16.  4. Morbid Obesity - Body mass index is 43.72 kg/m. - Referred to cardiac rehab but has not answered phone.   5. OSA/OHS - Sleep study ordered. Has previous diagnosis of OSA, but no longer has CPAP.  - He is having social issues and having difficulty scheduling. Will have [place] see today.   Meds as above. Labs this week OK as above.  RTC 4-6 weeks.   Shirley Friar, PA-C 12/02/16   Greater than 50% of the 25 minute visit was spent in counseling/coordination of care regarding disease  state education, sliding scale diuretics, salt/fluid restriction, cardiac rehab, and medication reconciliation.

## 2016-12-03 NOTE — Progress Notes (Signed)
Internal Medicine Clinic Attending  I saw and evaluated the patient.  I personally confirmed the key portions of the history and exam documented by Dr. Delma Officer and I reviewed pertinent patient test results.  The assessment, diagnosis, and plan were formulated together and I agree with the documentation in the resident's note.Jonathan [name] was adamant that he would not used injectable meds. We explained that if he made sig and lifelong lifestyle changes, that he would negate the need for injectables. Referral to [facility]up[facility] Leash - nutrition and DM education.

## 2016-12-08 ENCOUNTER — Other Ambulatory Visit (HOSPITAL_COMMUNITY): Payer: Self-pay | Admitting: Pharmacist

## 2016-12-08 ENCOUNTER — Other Ambulatory Visit (HOSPITAL_COMMUNITY): Payer: Self-pay

## 2016-12-08 NOTE — Progress Notes (Signed)
Paramedicine Encounter    Patient ID: Jonathan [name], male    DOB: October 27, 1985, 31 y.o.   MRN: [id number]   Patient Care Team: Valinda Party, DO as PCP - General (Internal Medicine)  Patient Active Problem List   Diagnosis Date Noted  . OSA (obstructive sleep apnea) 12/02/2016  . Chronic systolic (congestive) heart failure ([place])   . Obesity, Class III, BMI 40-49.9 (morbid obesity) ([place]) 10/26/2016  . Asthma, chronic, mild persistent, uncomplicated 84/66/5993  . Essential hypertension 10/26/2016  . Diabetes mellitus type 2 in obese ([place]) 04/20/2016    Current Outpatient Prescriptions:  .  albuterol (PROVENTIL HFA;VENTOLIN HFA) 108 (90 BASE) MCG/ACT inhaler, Inhale 2 puffs into the lungs every 4 (four) hours as needed for wheezing. Dispense with aerochamber, Disp: 1 Inhaler, Rfl: 0 .  blood glucose meter kit and supplies KIT, Dispense based on patient and insurance preference. Use up to four times daily as directed. (FOR ICD-9 250.00, 250.01). For QAC - HS accuchecks. May switch to any brand., Disp: 1 each, Rfl: 1 .  carvedilol (COREG) 12.5 MG tablet, Take 1 tablet (12.5 mg total) by mouth 2 (two) times daily with a meal., Disp: 60 tablet, Rfl: 6 .  fluticasone furoate-vilanterol (BREO ELLIPTA) 200-25 MCG/INH AEPB, Inhale 1 puff into the lungs daily., Disp: , Rfl:  .  ibuprofen (ADVIL,MOTRIN) 800 MG tablet, Take 800 mg by mouth every 8 (eight) hours., Disp: , Rfl:  .  metolazone (ZAROXOLYN) 2.5 MG tablet, Take 1 tablet (2.5 mg total) by mouth as needed. As directed by HF clinic for weight gain of 3 lbs overnight or 5 lbs within one week., Disp: 10 tablet, Rfl: 2 .  sacubitril-valsartan (ENTRESTO) 49-51 MG, Take 1 tablet by mouth 2 (two) times daily., Disp: 60 tablet, Rfl: 6 .  torsemide (DEMADEX) 20 MG tablet, Take 20 mg by mouth 2 (two) times daily., Disp: , Rfl:  .  spironolactone (ALDACTONE) 25 MG tablet, Take 1 tablet (25 mg total) by mouth daily. (Patient not taking: Reported on  12/08/2016), Disp: 30 tablet, Rfl: 6 Allergies  Allergen Reactions  . Hydrocodone     Hives      Social History   Social History  . Marital status: Single    Spouse name: N/A  . Number of children: N/A  . Years of education: N/A   Occupational History  . Not on file.   Social History Main Topics  . Smoking status: Never Smoker  . Smokeless tobacco: Never Used  . Alcohol use No  . Drug use: No  . Sexual activity: No   Other Topics Concern  . Not on file   Social History Narrative  . No narrative on file    Physical Exam  Constitutional: He is oriented to person, place, and time.  Cardiovascular: Normal rate and regular rhythm.   Pulmonary/Chest: Effort normal and breath sounds normal. No respiratory distress. He has no wheezes. He has no rales.  Abdominal: Soft.  Musculoskeletal: Normal range of motion. He exhibits edema.  Neurological: He is alert and oriented to person, place, and time.  Skin: Skin is warm and dry.  Psychiatric: He has a normal mood and affect.        SAFE - 11/09/16 1200      Situation   Admitting diagnosis chf   Heart failure history Exisiting   Comorbidities DM;HTN;Sleep Apnea   Readmitted within 30 days No   Hospital admission within past 12 months Yes   number of hospital admissions  1   number of ED visits 2     Assessment   Lives alone No   Primary support person Wife   Mode of transportation public bus   Other services involved None   Home equipement Scale     Weight   Weighs self daily Yes   Scale provided No   Records on weight chart No     Resources   Has "Living better w/heart failure" book Yes   Has HF Zone tool Yes   Able to identify yellow zone signs/when to call MD Yes   Records zone daily No     Medications   Uses a pill box No   Difficulty obtaining medications Yes   Barriers to medication financial   Community Paramedic obtains medications from pharmacy No   Mail order medications No   Missed one or  more doses of medications per week No     Nutrition   Patient receives meals on wheels No   Patient follows low sodium diet Yes   Has foods at home that meet the current recommended diet Yes   Patient follows low sugar/card diet Yes   Nutritional concerns/issues no     Activity Level   ADL's/Mobility Independent   How many feet can patient ambulate 0.5 mile   Typical activity level active     Urine   Difficulty urinating No   Changes in urine None     Time spent with patient   Time spent with patient  60 Minutes        Future Appointments Date Time Provider [place]  12/20/2016 8:00 PM MSD-SLEEL ROOM 7 MSD-SLEEL MSD  12/30/2016 1:30 PM [facility] PA/NP MC[facility] None  12/30/2016 2:45 PM Hoffman, Jessica Ratliff, DO IMP[facility]   BP 110/78 (BP Location: Right Arm, Patient Position: Sitting, Cuff Size: Large)   Pulse (!) 107   Resp 16   Wt (!) 347 lb 4.8 oz (157.5 kg)   SpO2 97%   BMI 44.59 kg/m  Weight yesterday- 345.6 lb Last visit weight- 340.5 lb CBG 429 mg/dl  Jonathan [name] was seen at home today and reports feeling generally well. He denied SOB at baseline, headache or dizziness. He has SOB when laying flat on his back however when he lays on his side or on his stomach he does not have trouble breathing. His weight was increased by 7 pounds since last week and he was directed to take Metolazone. He is also dealing with an elevated blood glucose level. He admits to eating poorly due to being stressed. He says the stress of a result of going through a move. His medications were verified. He asked about his physical limitations regarding exercise. I explained that becoming more active would improve his quality of life but that he should not overly exert himself and to take breaks often to prevent issues.   Time spent with patient: 39 minutes   Jonathan [name], EMT 12/08/16  ACTION: Home visit completed Next visit planned for 1 week

## 2016-12-17 ENCOUNTER — Other Ambulatory Visit (HOSPITAL_COMMUNITY): Payer: Self-pay

## 2016-12-17 NOTE — Progress Notes (Signed)
Paramedicine Encounter    Patient ID: Jonathan [name], male    DOB: 02/24/1985, 31 y.o.   MRN: [id number]   Patient Care Team: Valinda Party, DO as PCP - General (Internal Medicine)  Patient Active Problem List   Diagnosis Date Noted  . OSA (obstructive sleep apnea) 12/02/2016  . Chronic systolic (congestive) heart failure ([place])   . Obesity, Class III, BMI 40-49.9 (morbid obesity) ([place]) 10/26/2016  . Asthma, chronic, mild persistent, uncomplicated 38/18/2993  . Essential hypertension 10/26/2016  . Diabetes mellitus type 2 in obese ([place]) 04/20/2016    Current Outpatient Prescriptions:  .  carvedilol (COREG) 12.5 MG tablet, Take 1 tablet (12.5 mg total) by mouth 2 (two) times daily with a meal., Disp: 60 tablet, Rfl: 6 .  fluticasone furoate-vilanterol (BREO ELLIPTA) 200-25 MCG/INH AEPB, Inhale 1 puff into the lungs daily., Disp: , Rfl:  .  metolazone (ZAROXOLYN) 2.5 MG tablet, Take 1 tablet (2.5 mg total) by mouth as needed. As directed by HF clinic for weight gain of 3 lbs overnight or 5 lbs within one week., Disp: 10 tablet, Rfl: 2 .  sacubitril-valsartan (ENTRESTO) 49-51 MG, Take 1 tablet by mouth 2 (two) times daily., Disp: 60 tablet, Rfl: 6 .  sitaGLIPtin-metformin (JANUMET) 50-1000 MG tablet, Take 1 tablet by mouth 2 (two) times daily with a meal., Disp: , Rfl:  .  torsemide (DEMADEX) 20 MG tablet, Take 20 mg by mouth 2 (two) times daily., Disp: , Rfl:  .  albuterol (PROVENTIL HFA;VENTOLIN HFA) 108 (90 BASE) MCG/ACT inhaler, Inhale 2 puffs into the lungs every 4 (four) hours as needed for wheezing. Dispense with aerochamber (Patient not taking: Reported on 12/17/2016), Disp: 1 Inhaler, Rfl: 0 .  blood glucose meter kit and supplies KIT, Dispense based on patient and insurance preference. Use up to four times daily as directed. (FOR ICD-9 250.00, 250.01). For QAC - HS accuchecks. May switch to any brand. (Patient not taking: Reported on 12/17/2016), Disp: 1 each, Rfl: 1 .   ibuprofen (ADVIL,MOTRIN) 800 MG tablet, Take 800 mg by mouth every 8 (eight) hours., Disp: , Rfl:  .  spironolactone (ALDACTONE) 25 MG tablet, Take 1 tablet (25 mg total) by mouth daily. (Patient not taking: Reported on 12/08/2016), Disp: 30 tablet, Rfl: 6 Allergies  Allergen Reactions  . Hydrocodone     Hives       Social History   Social History  . Marital status: Single    Spouse name: N/A  . Number of children: N/A  . Years of education: N/A   Occupational History  . Not on file.   Social History Main Topics  . Smoking status: Never Smoker  . Smokeless tobacco: Never Used  . Alcohol use No  . Drug use: No  . Sexual activity: No   Other Topics Concern  . Not on file   Social History Narrative  . No narrative on file    Physical Exam  Constitutional: He is oriented to person, place, and time.  Cardiovascular: Normal rate and regular rhythm.   Pulmonary/Chest: Effort normal and breath sounds normal. No respiratory distress. He has no wheezes. He has no rales.  Abdominal: Soft. He exhibits no distension.  Musculoskeletal: Normal range of motion. He exhibits edema.  Neurological: He is alert and oriented to person, place, and time.  Skin: Skin is warm and dry.  Psychiatric: He has a normal mood and affect.        SAFE - 11/09/16 1200  Situation   Admitting diagnosis chf   Heart failure history Exisiting   Comorbidities DM;HTN;Sleep Apnea   Readmitted within 30 days No   Hospital admission within past 12 months Yes   number of hospital admissions 1   number of ED visits 2     Assessment   Lives alone No   Primary support person Wife   Mode of transportation public bus   Other services involved None   Home equipement Scale     Weight   Weighs self daily Yes   Scale provided No   Records on weight chart No     Resources   Has "Living better w/heart failure" book Yes   Has HF Zone tool Yes   Able to identify yellow zone signs/when to call MD  Yes   Records zone daily No     Medications   Uses a pill box No   Difficulty obtaining medications Yes   Barriers to medication financial   Community Paramedic obtains medications from pharmacy No   Mail order medications No   Missed one or more doses of medications per week No     Nutrition   Patient receives meals on wheels No   Patient follows low sodium diet Yes   Has foods at home that meet the current recommended diet Yes   Patient follows low sugar/card diet Yes   Nutritional concerns/issues no     Activity Level   ADL's/Mobility Independent   How many feet can patient ambulate 0.5 mile   Typical activity level active     Urine   Difficulty urinating No   Changes in urine None     Time spent with patient   Time spent with patient  60 Minutes        Future Appointments Date Time Provider [place]  12/20/2016 8:00 PM [facility]SLEEL ROOM 7 [facility]SLEEL MSD  12/28/2016 1:30 PM MC-CARDIAC PHASE II ORIENT MC-RE[facility]SC None  12/30/2016 1:30 PM [facility] PA/NP MC[facility] None  12/30/2016 2:45 PM Valinda Party, DO [facility]  01/03/2017 2:45 PM MC-PHASE2 MONITOR 1 MC-REHSC None  01/05/2017 2:45 PM MC-PHASE2 MONITOR 1 MC-REHSC None  01/10/2017 2:45 PM MC-PHASE2 MONITOR 1 MC-REHSC None  01/12/2017 2:45 PM MC-PHASE2 MONITOR 1 MC-REHSC None  01/14/2017 2:45 PM MC-PHASE2 MONITOR 1 MC-REHSC None  01/17/2017 2:45 PM MC-PHASE2 MONITOR 1 MC-REHSC None  01/19/2017 2:45 PM MC-PHASE2 MONITOR 1 MC-REHSC None  01/21/2017 2:45 PM MC-PHASE2 MONITOR 1 MC-REHSC None  01/24/2017 2:45 PM MC-PHASE2 MONITOR 1 MC-REHSC None  01/26/2017 2:45 PM MC-PHASE2 MONITOR 1 MC-REHSC None  01/28/2017 2:45 PM MC-PHASE2 MONITOR 1 MC-REHSC None  01/31/2017 2:45 PM MC-PHASE2 MONITOR 1 MC-REHSC None  02/02/2017 2:45 PM MC-PHASE2 MONITOR 1 MC-REHSC None  02/04/2017 2:45 PM MC-PHASE2 MONITOR 1 MC-REHSC None  02/07/2017 2:45 PM MC-PHASE2 MONITOR 1 MC-REHSC None  02/09/2017 2:45 PM MC-PHASE2 MONITOR 1  MC-REHSC None  02/11/2017 2:45 PM MC-PHASE2 MONITOR 1 MC-REHSC None  02/14/2017 2:45 PM MC-PHASE2 MONITOR 1 MC-REHSC None  02/16/2017 2:45 PM MC-PHASE2 MONITOR 1 MC-REHSC None  02/18/2017 2:45 PM MC-PHASE2 MONITOR 1 MC-REHSC None  02/21/2017 2:45 PM MC-PHASE2 MONITOR 1 MC-REHSC None  02/23/2017 2:45 PM MC-PHASE2 MONITOR 1 MC-REHSC None  02/25/2017 2:45 PM MC-PHASE2 MONITOR 1 MC-REHSC None  02/28/2017 2:45 PM MC-PHASE2 MONITOR 1 MC-REHSC None  03/02/2017 2:45 PM MC-PHASE2 MONITOR 1 MC-REHSC None  03/04/2017 2:45 PM MC-PHASE2 MONITOR 1 MC-REHSC None  03/07/2017 2:45 PM MC-PHASE2 MONITOR 1 MC-REHSC None  03/09/2017 2:45 PM  MC-PHASE2 MONITOR 1 MC-REHSC None  03/11/2017 2:45 PM MC-PHASE2 MONITOR 1 MC-REHSC None  03/14/2017 2:45 PM MC-PHASE2 MONITOR 1 MC-REHSC None  03/16/2017 2:45 PM MC-PHASE2 MONITOR 1 MC-REHSC None  03/18/2017 2:45 PM MC-PHASE2 MONITOR 1 MC-REHSC None  03/21/2017 2:45 PM MC-PHASE2 MONITOR 1 MC-REHSC None  03/23/2017 2:45 PM MC-PHASE2 MONITOR 1 MC-REHSC None  03/25/2017 2:45 PM MC-PHASE2 MONITOR 1 MC-REHSC None    BP 110/78 (BP Location: Left Arm, Patient Position: Sitting, Cuff Size: Large)   Pulse (!) 108   Resp 16   Wt (!) 339 lb 9.6 oz (154 kg)   SpO2 97%   BMI 43.60 kg/m  Weight yesterday- 340.8 lb Last visit weight- 347.4 lb CBG- 324 mg/dl  Mr Tortorella was seen at home today and reported feeling well. He denied SOB, dizziness, headache or orthopnea. He missed multiple evening doses of his medications last week but managed to lose weight anyway. He is using a pillbox now but does not want to put his Janumet in the box because "puting it in the other room gives him a reason to go in there." He currently is not taking spironolactone because he said the pharmacy would not fill it for him. I called the pharmacy and they advised they would have it ready on two hours as well as Entresto. He does not have a glucometer at present so does not check his CBG on a regular basis and his sugars are  trending high. I will discuss this with Kennyth Lose soon.   Time spent with patient: 35 minutes  [name], EMT 12/17/16  ACTION: Home visit completed Next visit planned for 1 week

## 2016-12-20 ENCOUNTER — Ambulatory Visit (HOSPITAL_BASED_OUTPATIENT_CLINIC_OR_DEPARTMENT_OTHER): Payer: BLUE CROSS/BLUE SHIELD | Attending: Cardiology | Admitting: Cardiology

## 2016-12-20 DIAGNOSIS — E119 Type 2 diabetes mellitus without complications: Secondary | ICD-10-CM | POA: Diagnosis not present

## 2016-12-20 DIAGNOSIS — Z6841 Body Mass Index (BMI) 40.0 and over, adult: Secondary | ICD-10-CM | POA: Diagnosis not present

## 2016-12-20 DIAGNOSIS — G4733 Obstructive sleep apnea (adult) (pediatric): Secondary | ICD-10-CM | POA: Insufficient documentation

## 2016-12-20 DIAGNOSIS — Z79899 Other long term (current) drug therapy: Secondary | ICD-10-CM | POA: Insufficient documentation

## 2016-12-20 DIAGNOSIS — R5383 Other fatigue: Secondary | ICD-10-CM | POA: Insufficient documentation

## 2016-12-20 DIAGNOSIS — I509 Heart failure, unspecified: Secondary | ICD-10-CM | POA: Diagnosis present

## 2016-12-20 DIAGNOSIS — E669 Obesity, unspecified: Secondary | ICD-10-CM | POA: Diagnosis not present

## 2016-12-20 DIAGNOSIS — R0683 Snoring: Secondary | ICD-10-CM | POA: Insufficient documentation

## 2016-12-20 DIAGNOSIS — I493 Ventricular premature depolarization: Secondary | ICD-10-CM | POA: Diagnosis not present

## 2016-12-21 NOTE — Procedures (Signed)
   Patient Name: Jonathan [name], Jonathan [name] Date: 12/20/2016 Gender: Male D.O.B: [birth year] Age (years): 31 Referring Provider: Suzzette Righter NP Height (inches): 74 Interpreting Physician: Armanda Magic MD, ABSM Weight (lbs): 338 RPSGT: Shelah Lewandowsky BMI: 43 MRN: [id number] Neck Size: 19.00  CLINICAL INFORMATION Sleep Study Type: NPSG  Indication for sleep study: Congestive Heart Failure, Diabetes, Fatigue, Obesity, Snoring, Witnessed Apneas  Epworth Sleepiness Score: 4  SLEEP STUDY TECHNIQUE As per the AASM Manual for the Scoring of Sleep and Associated Events v2.3 (April 2016) with a hypopnea requiring 4% desaturations.  The channels recorded and monitored were frontal, central and occipital EEG, electrooculogram (EOG), submentalis EMG (chin), nasal and oral airflow, thoracic and abdominal wall motion, anterior tibialis EMG, snore microphone, electrocardiogram, and pulse oximetry.  MEDICATIONS Medications self-administered by patient taken the night of the study : ENTRESTO, CARVEDILOL, SPIRONOLACTONE, JANUMET, TORSEMIDE  SLEEP ARCHITECTURE The study was initiated at 9:54:08 PM and ended at 4:07:43 AM.  Sleep onset time was 4.0 minutes and the sleep efficiency was 89.8%. The total sleep time was 335.5 minutes.  Stage REM latency was 163.0 minutes.  The patient spent 6.71% of the night in stage N1 sleep, 82.41% in stage N2 sleep, 0.15% in stage N3 and 10.73% in REM.  Alpha intrusion was absent.  Supine sleep was 32.93%.  RESPIRATORY PARAMETERS The overall apnea/hypopnea index (AHI) was 5.2 per hour. There were 8 total apneas, including 1 obstructive, 7 central and 0 mixed apneas. There were 21 hypopneas and 18 RERAs.  The AHI during Stage REM sleep was 16.7 per hour.  AHI while supine was 9.8 per hour.  The mean oxygen saturation was 92.45%. The minimum SpO2 during sleep was 86.00%.  moderate snoring was noted during this study.  CARDIAC DATA The 2 lead EKG  demonstrated sinus rhythm. The mean heart rate was 93.31 beats per minute. Other EKG findings include: PVCs.  LEG MOVEMENT DATA The total PLMS were 12 with a resulting PLMS index of 2.15. Associated arousal with leg movement index was 0.0 .  IMPRESSIONS - Mild obstructive sleep apnea occurred during this study (AHI = 5.2/h). - No significant central sleep apnea occurred during this study (CAI = 1.3/h). - Mild oxygen desaturation was noted during this study (Min O2 = 86.00%). - The patient snored with moderate snoring volume. - EKG findings include PVCs. - Clinically significant periodic limb movements did not occur during sleep. No significant associated arousals.  DIAGNOSIS - Obstructive Sleep Apnea (327.23 [G47.33 ICD-10])  RECOMMENDATIONS - Positional therapy avoiding supine position during sleep. - Very mild obstructive sleep apnea. Return to discuss treatment options. - Avoid alcohol, sedatives and other CNS depressants that may worsen sleep apnea and disrupt normal sleep architecture. - Sleep hygiene should be reviewed to assess factors that may improve sleep quality. - Weight management and regular exercise should be initiated or continued if appropriate.   Armanda Magic Diplomate, American Board of Sleep Medicine  ELECTRONICALLY SIGNED ON:  12/21/2016, 8:43 PM [facility] PH: [phone]   FX: [phone] ACCREDITED BY THE AMERICAN ACADEMY OF SLEEP MEDICINE

## 2016-12-23 ENCOUNTER — Telehealth: Payer: Self-pay | Admitting: *Deleted

## 2016-12-23 ENCOUNTER — Telehealth (HOSPITAL_COMMUNITY): Payer: Self-pay

## 2016-12-23 NOTE — Telephone Encounter (Signed)
-----   Message from Quintella Reichert, MD sent at 12/21/2016  8:46 PM EST ----- Patient has very mild OSA - set up OV to discuss treatment options.

## 2016-12-23 NOTE — Telephone Encounter (Signed)
Informed patient/Jonathan [name] per DPR of sleep study results and patient/Jonathan [name] understanding was verbalized. Patient understands he has very mild OSA. Patient/Jonathan [name] understands he has been set up for an appointment per Dr Mayford Knife to see Dr Mayford Knife to discuss his treatment options on March 03 2017 at 10:20. Patient thanked me for the call.

## 2016-12-23 NOTE — Telephone Encounter (Signed)
I called Jonathan [name] today to see if he was availale for me to come by this afternoon. He stated he was tired and wanted me to come tomorrow morning.

## 2016-12-24 ENCOUNTER — Other Ambulatory Visit (HOSPITAL_COMMUNITY): Payer: Self-pay

## 2016-12-24 NOTE — Progress Notes (Signed)
Paramedicine Encounter    Patient ID: Jonathan [name], male    DOB: 22-Jul-1985, 31 y.o.   MRN: [id number]   Patient Care Team: Valinda Party, DO as PCP - General (Internal Medicine)  Patient Active Problem List   Diagnosis Date Noted  . OSA (obstructive sleep apnea) 12/02/2016  . Chronic systolic (congestive) heart failure ([place])   . Obesity, Class III, BMI 40-49.9 (morbid obesity) ([place]) 10/26/2016  . Asthma, chronic, mild persistent, uncomplicated 33/35/4562  . Essential hypertension 10/26/2016  . Diabetes mellitus type 2 in obese ([place]) 04/20/2016    Current Outpatient Medications:  .  albuterol (PROVENTIL HFA;VENTOLIN HFA) 108 (90 BASE) MCG/ACT inhaler, Inhale 2 puffs into the lungs every 4 (four) hours as needed for wheezing. Dispense with aerochamber, Disp: 1 Inhaler, Rfl: 0 .  carvedilol (COREG) 12.5 MG tablet, Take 1 tablet (12.5 mg total) by mouth 2 (two) times daily with a meal., Disp: 60 tablet, Rfl: 6 .  fluticasone furoate-vilanterol (BREO ELLIPTA) 200-25 MCG/INH AEPB, Inhale 1 puff into the lungs daily., Disp: , Rfl:  .  ibuprofen (ADVIL,MOTRIN) 800 MG tablet, Take 800 mg by mouth every 8 (eight) hours., Disp: , Rfl:  .  metolazone (ZAROXOLYN) 2.5 MG tablet, Take 1 tablet (2.5 mg total) by mouth as needed. As directed by HF clinic for weight gain of 3 lbs overnight or 5 lbs within one week., Disp: 10 tablet, Rfl: 2 .  sacubitril-valsartan (ENTRESTO) 49-51 MG, Take 1 tablet by mouth 2 (two) times daily., Disp: 60 tablet, Rfl: 6 .  sitaGLIPtin-metformin (JANUMET) 50-1000 MG tablet, Take 1 tablet by mouth 2 (two) times daily with a meal., Disp: , Rfl:  .  spironolactone (ALDACTONE) 25 MG tablet, Take 1 tablet (25 mg total) by mouth daily., Disp: 30 tablet, Rfl: 6 .  torsemide (DEMADEX) 20 MG tablet, Take 20 mg by mouth 2 (two) times daily., Disp: , Rfl:  .  blood glucose meter kit and supplies KIT, Dispense based on patient and insurance preference. Use up to four times  daily as directed. (FOR ICD-9 250.00, 250.01). For QAC - HS accuchecks. May switch to any brand. (Patient not taking: Reported on 12/17/2016), Disp: 1 each, Rfl: 1 Allergies  Allergen Reactions  . Hydrocodone     Hives       Social History   Socioeconomic History  . Marital status: Single    Spouse name: Not on file  . Number of children: Not on file  . Years of education: Not on file  . Highest education level: Not on file  Social Needs  . Financial resource strain: Not on file  . Food insecurity - worry: Not on file  . Food insecurity - inability: Not on file  . Transportation needs - medical: Not on file  . Transportation needs - non-medical: Not on file  Occupational History  . Not on file  Tobacco Use  . Smoking status: Never Smoker  . Smokeless tobacco: Never Used  Substance and Sexual Activity  . Alcohol use: No  . Drug use: No  . Sexual activity: No  Other Topics Concern  . Not on file  Social History Narrative  . Not on file    Physical Exam  Constitutional: He is oriented to person, place, and time.  Cardiovascular: Normal rate and regular rhythm.  Pulmonary/Chest: Effort normal and breath sounds normal. No respiratory distress. He has no wheezes. He has no rales.  Musculoskeletal: Normal range of motion. He exhibits no edema.  Neurological: He  is alert and oriented to person, place, and time.  Skin: Skin is warm and dry.    SAFE - 11/09/16 1200      Situation   Admitting diagnosis  chf    Heart failure history  Exisiting    Comorbidities  DM;HTN;Sleep Apnea    Readmitted within 30 days  No    Hospital admission within past 12 months  Yes    number of hospital admissions  1    number of ED visits  2      Assessment   Lives alone  No    Primary support person  Wife    Mode of transportation  public bus    Other services involved  None    Home equipement  Scale      Weight   Weighs self daily  Yes    Scale provided  No    Records on weight  chart  No      Resources   Has "Living better w/heart failure" book  Yes    Has HF Zone tool  Yes    Able to identify yellow zone signs/when to call MD  Yes    Records zone daily  No      Medications   Uses a pill box  No    Difficulty obtaining medications  Yes    Barriers to medication  financial    Community Paramedic obtains medications from pharmacy  No    Mail order medications  No    Missed one or more doses of medications per week  No      Nutrition   Patient receives meals on wheels  No    Patient follows low sodium diet  Yes    Has foods at home that meet the current recommended diet  Yes    Patient follows low sugar/card diet  Yes    Nutritional concerns/issues  no      Activity Level   ADL's/Mobility  Independent    How many feet can patient ambulate  0.5 mile    Typical activity level  active      Urine   Difficulty urinating  No    Changes in urine  None      Time spent with patient   Time spent with patient   60 Minutes         Future Appointments  Date Time Provider [place]  12/28/2016  1:30 PM [facility] PHASE II ORIENT [facility] None  12/30/2016  1:30 PM [facility] PA/NP [facility] None  12/30/2016  2:45 PM Valinda Party, DO [facility]  01/03/2017  2:45 PM MC-PHASE2 MONITOR 1 [facility] None  01/05/2017  2:45 PM MC-PHASE2 MONITOR 1 [facility] None  01/10/2017  2:45 PM MC-PHASE2 MONITOR 1 [facility] None  01/12/2017  2:45 PM MC-PHASE2 MONITOR 1 [facility] None  01/14/2017  2:45 PM MC-PHASE2 MONITOR 1 [facility] None  01/17/2017  2:45 PM MC-PHASE2 MONITOR 1 [facility] None  01/19/2017  2:45 PM MC-PHASE2 MONITOR 1 [facility] None  01/21/2017  2:45 PM MC-PHASE2 MONITOR 1 [facility] None  01/24/2017  2:45 PM MC-PHASE2 MONITOR 1 [facility] None  01/26/2017  2:45 PM MC-PHASE2 MONITOR 1 [facility] None  01/28/2017  2:45 PM MC-PHASE2 MONITOR 1 [facility] None  01/31/2017  2:45 PM MC-PHASE2 MONITOR 1 [facility] None  02/02/2017  2:45 PM MC-PHASE2 MONITOR 1  [facility] None  02/04/2017  2:45 PM MC-PHASE2 MONITOR 1 [facility] None  02/07/2017  2:45 PM MC-PHASE2 MONITOR 1 [facility] None  02/09/2017  2:45 PM MC-PHASE2 MONITOR 1 [facility] None  02/11/2017  2:45 PM MC-PHASE2 MONITOR 1 [facility] None  02/14/2017  2:45 PM MC-PHASE2 MONITOR 1 [facility] None  02/16/2017  2:45 PM MC-PHASE2 MONITOR 1 [facility] None  02/18/2017  2:45 PM MC-PHASE2 MONITOR 1 [facility] None  02/21/2017  2:45 PM MC-PHASE2 MONITOR 1 [facility] None  02/23/2017  2:45 PM MC-PHASE2 MONITOR 1 [facility] None  02/25/2017  2:45 PM MC-PHASE2 MONITOR 1 [facility] None  02/28/2017  2:45 PM MC-PHASE2 MONITOR 1 [facility] None  03/02/2017  2:45 PM MC-PHASE2 MONITOR 1 [facility] None  03/03/2017 10:20 AM Sueanne Margarita, MD [facility]  03/04/2017  2:45 PM MC-PHASE2 MONITOR 1 [facility] None  03/07/2017  2:45 PM MC-PHASE2 MONITOR 1 [facility] None  03/09/2017  2:45 PM MC-PHASE2 MONITOR 1 [facility] None  03/11/2017  2:45 PM MC-PHASE2 MONITOR 1 [facility] None  03/14/2017  2:45 PM MC-PHASE2 MONITOR 1 [facility] None  03/16/2017  2:45 PM MC-PHASE2 MONITOR 1 [facility] None  03/18/2017  2:45 PM MC-PHASE2 MONITOR 1 [facility] None  03/21/2017  2:45 PM MC-PHASE2 MONITOR 1 [facility] None  03/23/2017  2:45 PM MC-PHASE2 MONITOR 1 [facility] None  03/25/2017  2:45 PM MC-PHASE2 MONITOR 1 [facility] None    BP 116/68 (BP Location: Right Arm, Patient Position: Sitting, Cuff Size: Large)   Pulse 100   Resp 16   Wt (!) 332 lb 6.4 oz (150.8 kg)   SpO2 98%   BMI 42.68 kg/m   Weight yesterday- 336.4 lb Last visit weight- 336.6 lb  Mr Cozort was seen at home today and reported feeling well. He denied SOB, dizziness or headache. He reports being compliant with his medications but did not want me to fill his pillbox because "it confuses him." He still needs to have to get a glucometer so he can check his blood sugars daily. I will contact his PCP to obtain a prescription.   Time spent with patient: 24 minutes  [name],  EMT 12/24/16  ACTION: Home visit completed Next visit planned for 1 week

## 2016-12-27 ENCOUNTER — Telehealth (HOSPITAL_COMMUNITY): Payer: Self-pay | Admitting: Pharmacy Technician

## 2016-12-27 NOTE — Telephone Encounter (Signed)
Cardiac Rehab Medication Review by a Pharmacist  Does the patient  feel that his/her medications are working for him/her?  yes  Has the patient been experiencing any side effects to the medications prescribed?  yes  Does the patient measure his/her own blood pressure or blood glucose at home?  yes   Does the patient have any problems obtaining medications due to transportation or finances?   no  Understanding of regimen: good Understanding of indications: good Potential of compliance: good    Pharmacist comments: [name] reports having a small silver dollar sized rash on his back and states he is concerned that Janumet may be the cause.  The timing is difficult to identify drug rash, and his symptoms are mild; He will talk to his PCP.  He states that he checks his CBGs infrequently and admits levels >200 on occasion after eating sugary meals.     Jonathan [name] Body 12/27/2016 1:57 PM

## 2016-12-28 ENCOUNTER — Encounter (HOSPITAL_COMMUNITY): Payer: Self-pay

## 2016-12-28 ENCOUNTER — Encounter (HOSPITAL_COMMUNITY)
Admission: RE | Admit: 2016-12-28 | Discharge: 2016-12-28 | Disposition: A | Discharge status: Home / Self Care | Payer: BLUE CROSS/BLUE SHIELD | Source: Ambulatory Visit | Attending: Internal Medicine | Admitting: Internal Medicine

## 2016-12-28 VITALS — BP 124/80 | HR 92 | Ht 72.5 in | Wt 347.4 lb

## 2016-12-28 DIAGNOSIS — I5022 Chronic systolic (congestive) heart failure: Secondary | ICD-10-CM | POA: Insufficient documentation

## 2016-12-28 NOTE — Progress Notes (Signed)
Cardiac Individual Treatment Plan  Patient Details  Name: Gid Schoffstall MRN: [id number] Date of Birth: Aug 07, 1985 Referring Provider:     [place] from 12/28/2016 in [place]  Referring Provider  Glori Bickers, MD.      Initial Encounter Date:    CARDIAC REHAB PHASE II ORIENTATION from 12/28/2016 in [place]  Date  12/28/16  Referring Provider  Glori Bickers, MD.      Visit Diagnosis: Chronic systolic CHF (congestive heart failure) ([place])  Patient's Home Medications on Admission:  Current Outpatient Medications:  .  albuterol (PROVENTIL HFA;VENTOLIN HFA) 108 (90 BASE) MCG/ACT inhaler, Inhale 2 puffs into the lungs every 4 (four) hours as needed for wheezing. Dispense with aerochamber, Disp: 1 Inhaler, Rfl: 0 .  Amino Acids (L-CARNITINE PO), Take 1 tablet daily by mouth., Disp: , Rfl:  .  blood glucose meter kit and supplies KIT, Dispense based on patient and insurance preference. Use up to four times daily as directed. (FOR ICD-9 250.00, 250.01). For QAC - HS accuchecks. May switch to any brand. (Patient not taking: Reported on 12/17/2016), Disp: 1 each, Rfl: 1 .  carvedilol (COREG) 12.5 MG tablet, Take 1 tablet (12.5 mg total) by mouth 2 (two) times daily with a meal., Disp: 60 tablet, Rfl: 6 .  fluticasone furoate-vilanterol (BREO ELLIPTA) 200-25 MCG/INH AEPB, Inhale 1 puff into the lungs daily., Disp: , Rfl:  .  ibuprofen (ADVIL,MOTRIN) 800 MG tablet, Take 800 mg by mouth every 8 (eight) hours., Disp: , Rfl:  .  metolazone (ZAROXOLYN) 2.5 MG tablet, Take 1 tablet (2.5 mg total) by mouth as needed. As directed by HF clinic for weight gain of 3 lbs overnight or 5 lbs within one week., Disp: 10 tablet, Rfl: 2 .  sacubitril-valsartan (ENTRESTO) 49-51 MG, Take 1 tablet by mouth 2 (two) times daily., Disp: 60 tablet, Rfl: 6 .  sitaGLIPtin-metformin (JANUMET) 50-1000 MG tablet, Take 1 tablet  by mouth 2 (two) times daily with a meal., Disp: , Rfl:  .  spironolactone (ALDACTONE) 25 MG tablet, Take 1 tablet (25 mg total) by mouth daily., Disp: 30 tablet, Rfl: 6 .  tiotropium (SPIRIVA) 18 MCG inhalation capsule, Place 18 mcg daily into inhaler and inhale., Disp: , Rfl:  .  torsemide (DEMADEX) 20 MG tablet, Take 20 mg by mouth 2 (two) times daily., Disp: , Rfl:   Past Medical History: Past Medical History:  Diagnosis Date  . Asthma   . CHF (congestive heart failure) ([place])   . Hypertension   . Obesity   . Type 2 diabetes mellitus with diabetic neuropathy ([facility])     Tobacco Use: Social History   Tobacco Use  Smoking Status Never Smoker  Smokeless Tobacco Never Used    Labs: Recent Review Flowsheet Data    Labs for ITP Cardiac and Pulmonary Rehab Latest Ref Rng & Units 04/19/2016 10/27/2016 11/30/2016   Cholestrol 0 - 200 mg/dL - 135 -   LDLCALC 0 - 99 mg/dL - 96 -   HDL >40 mg/dL - 22(L) -   Trlycerides <150 mg/dL - 83 -   Hemoglobin A1c - - 10.8(H) 13.5   HCO3 20.0 - 28.0 mmol/L 22.7 - -   TCO2 0 - 100 mmol/L 24 - -   ACIDBASEDEF 0.0 - 2.0 mmol/L 2.0 - -   O2SAT % 72.0 - -      Capillary Blood Glucose: Lab Results  Component Value Date  GLUCAP 404 (H) 11/30/2016   GLUCAP 235 (H) 10/29/2016   GLUCAP 194 (H) 10/29/2016   GLUCAP 301 (H) 10/28/2016   GLUCAP 237 (H) 10/28/2016     Exercise Target Goals: Date: 12/28/16  Exercise Program Goal: Individual exercise prescription set with THRR, safety & activity barriers. Participant demonstrates ability to understand and report RPE using BORG scale, to self-measure pulse accurately, and to acknowledge the importance of the exercise prescription.  Exercise Prescription Goal: Starting with aerobic activity 30 plus minutes a day, 3 days per week for initial exercise prescription. Provide home exercise prescription and guidelines that participant acknowledges understanding prior to discharge.  Activity Barriers & Risk  Stratification: Activity Barriers & Cardiac Risk Stratification - 12/28/16 1517      Activity Barriers & Cardiac Risk Stratification   Activity Barriers  Other (comment);Back Problems    Comments  Reconstructive surgery, both feet. Plate and screw in both feet.    Cardiac Risk Stratification  High       6 Minute Walk: 6 Minute Walk    Row Name 12/28/16 1539         6 Minute Walk   Phase  Initial     Distance  1277 feet     Walk Time  6 minutes     # of Rest Breaks  0     MPH  2.42     METS  4.14     RPE  11     VO2 Peak  14.49     Symptoms  Yes (comment)     Comments  Tingling in feet at end of walk test.     Resting HR  92 bpm     Resting BP  124/80     Resting Oxygen Saturation   98 %     Exercise Oxygen Saturation  during 6 min walk  97 %     Max Ex. HR  131 bpm     Max Ex. BP  136/84     2 Minute Post BP  129/85        Oxygen Initial Assessment:   Oxygen Re-Evaluation:   Oxygen Discharge (Final Oxygen Re-Evaluation):   Initial Exercise Prescription: Initial Exercise Prescription - 12/28/16 1500      Date of Initial Exercise RX and Referring Provider   Date  12/28/16    Referring Provider  Glori Bickers, MD.      NuStep   Level  4    SPM  85    Minutes  10    METs  2.5      Arm Ergometer   Level  3    Minutes  10    METs  2.5      Track   Laps  9    Minutes  10    METs  2.57      Prescription Details   Frequency (times per week)  3    Duration  Progress to 30 minutes of continuous aerobic without signs/symptoms of physical distress      Intensity   THRR 40-80% of Max Heartrate  76-151    Ratings of Perceived Exertion  11-13    Perceived Dyspnea  0-4      Progression   Progression  Continue to progress workloads to maintain intensity without signs/symptoms of physical distress.      Resistance Training   Training Prescription  Yes    Weight  3lbs    Reps  10-15  Perform Capillary Blood Glucose checks as  needed.  Exercise Prescription Changes:   Exercise Comments:   Exercise Goals and Review: Exercise Goals    Row Name 12/28/16 1536             Exercise Goals   Increase Physical Activity  Yes       Intervention  Provide advice, education, support and counseling about physical activity/exercise needs.;Develop an individualized exercise prescription for aerobic and resistive training based on initial evaluation findings, risk stratification, comorbidities and participant's personal goals.       Expected Outcomes  Achievement of increased cardiorespiratory fitness and enhanced flexibility, muscular endurance and strength shown through measurements of functional capacity and personal statement of participant.       Increase Strength and Stamina  Yes       Intervention  Provide advice, education, support and counseling about physical activity/exercise needs.;Develop an individualized exercise prescription for aerobic and resistive training based on initial evaluation findings, risk stratification, comorbidities and participant's personal goals.       Expected Outcomes  Achievement of increased cardiorespiratory fitness and enhanced flexibility, muscular endurance and strength shown through measurements of functional capacity and personal statement of participant.       Able to understand and use rate of perceived exertion (RPE) scale  Yes       Intervention  Provide education and explanation on how to use RPE scale       Expected Outcomes  Short Term: Able to use RPE daily in rehab to express subjective intensity level;Long Term:  Able to use RPE to guide intensity level when exercising independently       Knowledge and understanding of Target Heart Rate Range (THRR)  Yes       Intervention  Provide education and explanation of THRR including how the numbers were predicted and where they are located for reference       Expected Outcomes  Short Term: Able to state/look up THRR;Long Term: Able  to use THRR to govern intensity when exercising independently;Short Term: Able to use daily as guideline for intensity in rehab       Able to check pulse independently  Yes       Intervention  Provide education and demonstration on how to check pulse in carotid and radial arteries.;Review the importance of being able to check your own pulse for safety during independent exercise       Expected Outcomes  Short Term: Able to explain why pulse checking is important during independent exercise;Long Term: Able to check pulse independently and accurately       Understanding of Exercise Prescription  Yes       Intervention  Provide education, explanation, and written materials on patient's individual exercise prescription       Expected Outcomes  Short Term: Able to explain program exercise prescription;Long Term: Able to explain home exercise prescription to exercise independently          Exercise Goals Re-Evaluation :    Discharge Exercise Prescription (Final Exercise Prescription Changes):   Nutrition:  Target Goals: Understanding of nutrition guidelines, daily intake of sodium <1528m, cholesterol <2022m calories 30% from fat and 7% or less from saturated fats, daily to have 5 or more servings of fruits and vegetables.  Biometrics: Pre Biometrics - 12/28/16 1644      Pre Biometrics   Height  6' 0.5" (1.842 m)    Weight  347 lb 7.1 oz (157.6 kg)  (Abnormal)  Waist Circumference  51.75 inches    Hip Circumference  58 inches    Waist to Hip Ratio  0.89 %    BMI (Calculated)  46.45    Triceps Skinfold  40 mm    % Body Fat  42.3 %    Grip Strength  32.5 kg    Single Leg Stand  1.25 seconds        Nutrition Therapy Plan and Nutrition Goals:   Nutrition Discharge: Nutrition Scores:   Nutrition Goals Re-Evaluation:   Nutrition Goals Re-Evaluation:   Nutrition Goals Discharge (Final Nutrition Goals Re-Evaluation):   Psychosocial: Target Goals: Acknowledge presence or  absence of significant depression and/or stress, maximize coping skills, provide positive support system. Participant is able to verbalize types and ability to use techniques and skills needed for reducing stress and depression.  Initial Review & Psychosocial Screening: Initial Psych Review & Screening - 12/28/16 1643      Screening Interventions   Interventions  Encouraged to exercise;To provide support and resources with identified psychosocial needs;Provide feedback about the scores to participant       Quality of Life Scores: Quality of Life - 12/28/16 1624      Quality of Life Scores   Health/Function Pre  13.2 %    Socioeconomic Pre  16.29 %    Psych/Spiritual Pre  19.71 %    Family Pre  21 %    GLOBAL Pre  16.18 %       PHQ-9: Recent Review Flowsheet Data    Depression screen [facility] 2/9 11/30/2016   Decreased Interest 3   Down, Depressed, Hopeless 3   PHQ - 2 Score 6   Altered sleeping 3   Tired, decreased energy 3   Change in appetite 2   Feeling bad or failure about yourself  2   Trouble concentrating 3   Moving slowly or fidgety/restless 3   Suicidal thoughts 0   PHQ-9 Score 22   Difficult doing work/chores Extremely dIfficult     Interpretation of Total Score  Total Score Depression Severity:  1-4 = Minimal depression, 5-9 = Mild depression, 10-14 = Moderate depression, 15-19 = Moderately severe depression, 20-27 = Severe depression   Psychosocial Evaluation and Intervention:   Psychosocial Re-Evaluation:   Psychosocial Discharge (Final Psychosocial Re-Evaluation):   Vocational Rehabilitation: Provide vocational rehab assistance to qualifying candidates.   Vocational Rehab Evaluation & Intervention: Vocational Rehab - 12/28/16 1640      Initial Vocational Rehab Evaluation & Intervention   Assessment shows need for Vocational Rehabilitation  Yes    Vocational Rehab Packet given to patient  12/28/16       Education: Education Goals: Education  classes will be provided on a weekly basis, covering required topics. Participant will state understanding/return demonstration of topics presented.  Learning Barriers/Preferences: Learning Barriers/Preferences - 12/28/16 1640      Learning Barriers/Preferences   Learning Barriers  Sight    Financial risk analyst;Computer/Internet;Group Instruction;Individual Instruction;Pictoral;Skilled Demonstration;Verbal Instruction;Video;Written Material       Education Topics: Count Your Pulse:  -Group instruction provided by verbal instruction, demonstration, patient participation and written materials to support subject.  Instructors address importance of being able to find your pulse and how to count your pulse when at home without a heart monitor.  Patients get hands on experience counting their pulse with staff help and individually.   Heart Attack, Angina, and Risk Factor Modification:  -Group instruction provided by verbal instruction, video, and written materials to support subject.  Instructors address signs and symptoms of angina and heart attacks.    Also discuss risk factors for heart disease and how to make changes to improve heart health risk factors.   Functional Fitness:  -Group instruction provided by verbal instruction, demonstration, patient participation, and written materials to support subject.  Instructors address safety measures for doing things around the house.  Discuss how to get up and down off the floor, how to pick things up properly, how to safely get out of a chair without assistance, and balance training.   Meditation and Mindfulness:  -Group instruction provided by verbal instruction, patient participation, and written materials to support subject.  Instructor addresses importance of mindfulness and meditation practice to help reduce stress and improve awareness.  Instructor also leads participants through a meditation exercise.    Stretching for Flexibility  and Mobility:  -Group instruction provided by verbal instruction, patient participation, and written materials to support subject.  Instructors lead participants through series of stretches that are designed to increase flexibility thus improving mobility.  These stretches are additional exercise for major muscle groups that are typically performed during regular warm up and cool down.   Hands Only CPR:  -Group verbal, video, and participation provides a basic overview of AHA guidelines for community CPR. Role-play of emergencies allow participants the opportunity to practice calling for help and chest compression technique with discussion of AED use.   Hypertension: -Group verbal and written instruction that provides a basic overview of hypertension including the most recent diagnostic guidelines, risk factor reduction with self-care instructions and medication management.    Nutrition I class: Heart Healthy Eating:  -Group instruction provided by PowerPoint slides, verbal discussion, and written materials to support subject matter. The instructor gives an explanation and review of the Therapeutic Lifestyle Changes diet recommendations, which includes a discussion on lipid goals, dietary fat, sodium, fiber, plant stanol/sterol esters, sugar, and the components of a well-balanced, healthy diet.   Nutrition II class: Lifestyle Skills:  -Group instruction provided by PowerPoint slides, verbal discussion, and written materials to support subject matter. The instructor gives an explanation and review of label reading, grocery shopping for heart health, heart healthy recipe modifications, and ways to make healthier choices when eating out.   Diabetes Question & Answer:  -Group instruction provided by PowerPoint slides, verbal discussion, and written materials to support subject matter. The instructor gives an explanation and review of diabetes co-morbidities, pre- and post-prandial blood glucose  goals, pre-exercise blood glucose goals, signs, symptoms, and treatment of hypoglycemia and hyperglycemia, and foot care basics.   Diabetes Blitz:  -Group instruction provided by PowerPoint slides, verbal discussion, and written materials to support subject matter. The instructor gives an explanation and review of the physiology behind type 1 and type 2 diabetes, diabetes medications and rational behind using different medications, pre- and post-prandial blood glucose recommendations and Hemoglobin A1c goals, diabetes diet, and exercise including blood glucose guidelines for exercising safely.    Portion Distortion:  -Group instruction provided by PowerPoint slides, verbal discussion, written materials, and food models to support subject matter. The instructor gives an explanation of serving size versus portion size, changes in portions sizes over the last 20 years, and what consists of a serving from each food group.   Stress Management:  -Group instruction provided by verbal instruction, video, and written materials to support subject matter.  Instructors review role of stress in heart disease and how to cope with stress positively.     Exercising on Your  Own:  -Group instruction provided by verbal instruction, power point, and written materials to support subject.  Instructors discuss benefits of exercise, components of exercise, frequency and intensity of exercise, and end points for exercise.  Also discuss use of nitroglycerin and activating EMS.  Review options of places to exercise outside of rehab.  Review guidelines for sex with heart disease.   Cardiac Drugs I:  -Group instruction provided by verbal instruction and written materials to support subject.  Instructor reviews cardiac drug classes: antiplatelets, anticoagulants, beta blockers, and statins.  Instructor discusses reasons, side effects, and lifestyle considerations for each drug class.   Cardiac Drugs II:  -Group  instruction provided by verbal instruction and written materials to support subject.  Instructor reviews cardiac drug classes: angiotensin converting enzyme inhibitors (ACE-I), angiotensin II receptor blockers (ARBs), nitrates, and calcium channel blockers.  Instructor discusses reasons, side effects, and lifestyle considerations for each drug class.   Anatomy and Physiology of the Circulatory System:  Group verbal and written instruction and models provide basic cardiac anatomy and physiology, with the coronary electrical and arterial systems. Review of: AMI, Angina, Valve disease, Heart Failure, Peripheral Artery Disease, Cardiac Arrhythmia, Pacemakers, and the ICD.   Other Education:  -Group or individual verbal, written, or video instructions that support the educational goals of the cardiac rehab program.   Knowledge Questionnaire Score: Knowledge Questionnaire Score - 12/28/16 1627      Knowledge Questionnaire Score   Pre Score  18/24       Core Components/Risk Factors/Patient Goals at Admission: Personal Goals and Risk Factors at Admission - 12/28/16 1439      Core Components/Risk Factors/Patient Goals on Admission    Weight Management  Yes;Obesity;Weight Maintenance;Weight Loss    Intervention  Weight Management: Provide education and appropriate resources to help participant work on and attain dietary goals.;Weight Management: Develop a combined nutrition and exercise program designed to reach desired caloric intake, while maintaining appropriate intake of nutrient and fiber, sodium and fats, and appropriate energy expenditure required for the weight goal.;Weight Management/Obesity: Establish reasonable short term and long term weight goals.;Obesity: Provide education and appropriate resources to help participant work on and attain dietary goals.    Expected Outcomes  Short Term: Continue to assess and modify interventions until short term weight is achieved;Weight Maintenance:  Understanding of the daily nutrition guidelines, which includes 25-35% calories from fat, 7% or less cal from saturated fats, less than 273m cholesterol, less than 1.5gm of sodium, & 5 or more servings of fruits and vegetables daily;Long Term: Adherence to nutrition and physical activity/exercise program aimed toward attainment of established weight goal;Weight Loss: Understanding of general recommendations for a balanced deficit meal plan, which promotes 1-2 lb weight loss per week and includes a negative energy balance of [phone] kcal/d;Understanding recommendations for meals to include 15-35% energy as protein, 25-35% energy from fat, 35-60% energy from carbohydrates, less than 2061mof dietary cholesterol, 20-35 gm of total fiber daily;Understanding of distribution of calorie intake throughout the day with the consumption of 4-5 meals/snacks    Diabetes  Yes    Intervention  Provide education about signs/symptoms and action to take for hypo/hyperglycemia.;Provide education about proper nutrition, including hydration, and aerobic/resistive exercise prescription along with prescribed medications to achieve blood glucose in normal ranges: Fasting glucose 65-99 mg/dL    Expected Outcomes  Short Term: Participant verbalizes understanding of the signs/symptoms and immediate care of hyper/hypoglycemia, proper foot care and importance of medication, aerobic/resistive exercise and nutrition plan for blood glucose  control.;Long Term: Attainment of HbA1C < 7%.    Heart Failure  Yes    Intervention  Provide a combined exercise and nutrition program that is supplemented with education, support and counseling about heart failure. Directed toward relieving symptoms such as shortness of breath, decreased exercise tolerance, and extremity edema.    Expected Outcomes  Improve functional capacity of life;Short term: Attendance in program 2-3 days a week with increased exercise capacity. Reported lower sodium intake.  Reported increased fruit and vegetable intake. Reports medication compliance.;Short term: Daily weights obtained and reported for increase. Utilizing diuretic protocols set by physician.;Long term: Adoption of self-care skills and reduction of barriers for early signs and symptoms recognition and intervention leading to self-care maintenance.    Hypertension  Yes    Intervention  Provide education on lifestyle modifcations including regular physical activity/exercise, weight management, moderate sodium restriction and increased consumption of fresh fruit, vegetables, and low fat dairy, alcohol moderation, and smoking cessation.;Monitor prescription use compliance.    Expected Outcomes  Short Term: Continued assessment and intervention until BP is < 140/31m HG in hypertensive participants. < 130/823mHG in hypertensive participants with diabetes, heart failure or chronic kidney disease.;Long Term: Maintenance of blood pressure at goal levels.    Stress  Yes    Intervention  Offer individual and/or small group education and counseling on adjustment to heart disease, stress management and health-related lifestyle change. Teach and support self-help strategies.;Refer participants experiencing significant psychosocial distress to appropriate mental health specialists for further evaluation and treatment. When possible, include family members and significant others in education/counseling sessions.    Expected Outcomes  Short Term: Participant demonstrates changes in health-related behavior, relaxation and other stress management skills, ability to obtain effective social support, and compliance with psychotropic medications if prescribed.;Long Term: Emotional wellbeing is indicated by absence of clinically significant psychosocial distress or social isolation.       Core Components/Risk Factors/Patient Goals Review:    Core Components/Risk Factors/Patient Goals at Discharge (Final Review):    ITP  Comments: ITP Comments    Row Name 12/28/16 1407           ITP Comments  Dr TrFransico HimMedical Director          Comments: AlZenia Residesttended orientation from 1336 to 1545 to review rules and guidelines for program. Completed 6 minute walk test, Intitial ITP, and exercise prescription.  VSS. Telemetry-Sinus Rhythm downward QRS, bundle branch block, this has been previously documented.  Tige did complain of tingling in his feet toward the end of his walk test.Maria WhVenetia MaxonRN,BSN 12/28/2016 4:57 PM

## 2016-12-29 ENCOUNTER — Telehealth (HOSPITAL_COMMUNITY): Payer: Self-pay

## 2016-12-29 NOTE — Telephone Encounter (Signed)
CHF Clinic appointment reminder call placed to patient for upcoming post-hospital follow up.  Does understand purpose of this appointment and where CHF Clinic is located? Yes  How is patient feeling? Well, no complaints. Needs disability and food stamps forms filled out, states he gave food stamps form to [place] with [facility] program last week.  Other disability forms on file in clinic.  Does patient have all of their medications since their recent discharge? Yes  Patient also reminded to take all medications as prescribed on the day of his/her appointment and to bring all medications to this appointment.  Advised to call our office for tardiness or cancellations/rescheduling needs.  Jonathan [name], Bettina Gavia

## 2016-12-29 NOTE — Progress Notes (Signed)
   CC: Follow up on type II diabetes mellitus  HPI:  [name] is a 31 y.o. male with history noted below that presents to the Internal Medicine Clinic for follow up on type II diabetes mellitus.  Please see problem based charting for the status of patient's chronic medical conditions.  Past Medical History:  Diagnosis Date  . Asthma   . CHF (congestive heart failure) (HCC)   . Hypertension   . Obesity   . Type 2 diabetes mellitus with diabetic neuropathy (HCC)     Review of Systems:  Review of Systems  Respiratory: Negative for shortness of breath.   Cardiovascular: Negative for chest pain and orthopnea.  Gastrointestinal: Negative for abdominal pain, nausea and vomiting.  Genitourinary: Negative for frequency.  Endo/Heme/Allergies: Negative for polydipsia.     Physical Exam:  Vitals:   12/30/16 1458  BP: 127/82  Pulse: 91  Temp: 98 F (36.7 C)  TempSrc: Oral  SpO2: 100%  Weight: (!) 348 lb 8 oz (158.1 kg)   Physical Exam  Constitutional:  Morbidly obese  Cardiovascular: Normal rate, regular rhythm and normal heart sounds. Exam reveals no gallop and no friction rub.  No murmur heard. Pulmonary/Chest: Effort normal and breath sounds normal. No respiratory distress. He has no wheezes. He has no rales.  Musculoskeletal: He exhibits no edema.    Assessment & Plan:   See encounters tab for problem based medical decision making.    Patient discussed with Dr. Josem Kaufmann

## 2016-12-30 ENCOUNTER — Ambulatory Visit (HOSPITAL_COMMUNITY)
Admission: RE | Admit: 2016-12-30 | Discharge: 2016-12-30 | Disposition: A | Discharge status: Home / Self Care | Payer: BLUE CROSS/BLUE SHIELD | Source: Ambulatory Visit | Attending: Internal Medicine | Admitting: Internal Medicine

## 2016-12-30 ENCOUNTER — Other Ambulatory Visit (HOSPITAL_COMMUNITY): Payer: Self-pay

## 2016-12-30 ENCOUNTER — Ambulatory Visit (INDEPENDENT_AMBULATORY_CARE_PROVIDER_SITE_OTHER): Payer: BLUE CROSS/BLUE SHIELD | Admitting: Internal Medicine

## 2016-12-30 ENCOUNTER — Encounter: Payer: Self-pay | Admitting: Internal Medicine

## 2016-12-30 ENCOUNTER — Encounter (HOSPITAL_COMMUNITY): Payer: Self-pay

## 2016-12-30 VITALS — BP 142/88 | HR 99 | Wt 351.2 lb

## 2016-12-30 DIAGNOSIS — I428 Other cardiomyopathies: Secondary | ICD-10-CM | POA: Insufficient documentation

## 2016-12-30 DIAGNOSIS — I5020 Unspecified systolic (congestive) heart failure: Secondary | ICD-10-CM

## 2016-12-30 DIAGNOSIS — I110 Hypertensive heart disease with heart failure: Secondary | ICD-10-CM | POA: Insufficient documentation

## 2016-12-30 DIAGNOSIS — E1169 Type 2 diabetes mellitus with other specified complication: Secondary | ICD-10-CM | POA: Diagnosis not present

## 2016-12-30 DIAGNOSIS — Z6841 Body Mass Index (BMI) 40.0 and over, adult: Secondary | ICD-10-CM | POA: Diagnosis not present

## 2016-12-30 DIAGNOSIS — Z7984 Long term (current) use of oral hypoglycemic drugs: Secondary | ICD-10-CM | POA: Insufficient documentation

## 2016-12-30 DIAGNOSIS — I5022 Chronic systolic (congestive) heart failure: Secondary | ICD-10-CM

## 2016-12-30 DIAGNOSIS — J45909 Unspecified asthma, uncomplicated: Secondary | ICD-10-CM | POA: Insufficient documentation

## 2016-12-30 DIAGNOSIS — G4733 Obstructive sleep apnea (adult) (pediatric): Secondary | ICD-10-CM | POA: Diagnosis not present

## 2016-12-30 DIAGNOSIS — Z79899 Other long term (current) drug therapy: Secondary | ICD-10-CM | POA: Insufficient documentation

## 2016-12-30 DIAGNOSIS — E669 Obesity, unspecified: Secondary | ICD-10-CM

## 2016-12-30 DIAGNOSIS — E1140 Type 2 diabetes mellitus with diabetic neuropathy, unspecified: Secondary | ICD-10-CM | POA: Insufficient documentation

## 2016-12-30 DIAGNOSIS — I10 Essential (primary) hypertension: Secondary | ICD-10-CM

## 2016-12-30 DIAGNOSIS — E119 Type 2 diabetes mellitus without complications: Secondary | ICD-10-CM | POA: Diagnosis not present

## 2016-12-30 LAB — BASIC METABOLIC PANEL
ANION GAP: 8 (ref 5–15)
BUN: 7 mg/dL (ref 6–20)
CALCIUM: 9.1 mg/dL (ref 8.9–10.3)
CO2: 28 mmol/L (ref 22–32)
Chloride: 101 mmol/L (ref 101–111)
Creatinine, Ser: 0.76 mg/dL (ref 0.61–1.24)
Glucose, Bld: 247 mg/dL — ABNORMAL HIGH (ref 65–99)
POTASSIUM: 3.7 mmol/L (ref 3.5–5.1)
Sodium: 137 mmol/L (ref 135–145)

## 2016-12-30 LAB — GLUCOSE, CAPILLARY: Glucose-Capillary: 222 mg/dL — ABNORMAL HIGH (ref 65–99)

## 2016-12-30 MED ORDER — GLIPIZIDE ER 2.5 MG PO TB24: 2.5000 mg | ORAL_TABLET | Freq: Every day | ORAL | 2 refills | Status: DC

## 2016-12-30 MED ORDER — SACUBITRIL-VALSARTAN 97-103 MG PO TABS: 1.0000 | ORAL_TABLET | Freq: Two times a day (BID) | ORAL | 6 refills | Status: DC

## 2016-12-30 MED ORDER — SITAGLIPTIN PHOSPHATE 100 MG PO TABS: 100.0000 mg | ORAL_TABLET | Freq: Every day | ORAL | 2 refills | Status: DC

## 2016-12-30 NOTE — Progress Notes (Signed)
Advanced Heart Failure Clinic Note    Primary Care: No PCP  Primary Cardiologist: Dr. Haroldine Laws   HPI: Jonathan [name] is a 31 y.o. male with history of systolic CHF due to NICM, HTN, DM 2, Asthma, OSA, and morbid obesity (Body mass index is 48.76 kg/m.)  Pt presented to [facility] 10/26/16 with progressive SOB x 2 weeks. He reports h/o systolic CHF with EF 41-58% by Echo in [place] x 1 year ago. Cath around that time without any significant CAD per patient. Recently went for a long period without any insurance, but recently re-obtained through his wifes work. He was diuresed with IV Lasix, discharge weight was 373 pounds. He had run out of most of his medications due to moving to [place] and not having insurance. Meds restarted at discharge: Coreg, Mearl Latin, Lasix.   He presents today for follow up. Weight up 11 lbs since last visit. At last visit coreg increased.  Feeling OK. Denies SOB with flat ground or ADLs, can walk for 15-20 minutes. Does get SOB bending over to pick something up or carrying groceries in. Sleeps on his sides with chronic orthopnea. Has not had sleep study.  Has started cardiac rehab this week and seems to have went well. He denies CP, lightheadedness or dizziness. Taking medications as directed.   Labs 11/30/16 K 4.7, Cr 0.89  Review of systems complete and found to be negative unless listed in HPI.   Past Medical History:  Diagnosis Date  . Asthma   . CHF (congestive heart failure) ([place])   . Hypertension   . Obesity   . Type 2 diabetes mellitus with diabetic neuropathy (HCC)     Current Outpatient Medications  Medication Sig Dispense Refill  . albuterol (PROVENTIL HFA;VENTOLIN HFA) 108 (90 BASE) MCG/ACT inhaler Inhale 2 puffs into the lungs every 4 (four) hours as needed for wheezing. Dispense with aerochamber 1 Inhaler 0  . Amino Acids (L-CARNITINE PO) Take 1 tablet daily by mouth.    . carvedilol (COREG) 12.5 MG tablet Take 1 tablet (12.5 mg total) by mouth 2 (two)  times daily with a meal. 60 tablet 6  . fluticasone furoate-vilanterol (BREO ELLIPTA) 200-25 MCG/INH AEPB Inhale 1 puff into the lungs daily.    Marland Kitchen ibuprofen (ADVIL,MOTRIN) 800 MG tablet Take 800 mg by mouth every 8 (eight) hours.    . metolazone (ZAROXOLYN) 2.5 MG tablet Take 1 tablet (2.5 mg total) by mouth as needed. As directed by HF clinic for weight gain of 3 lbs overnight or 5 lbs within one week. 10 tablet 2  . sacubitril-valsartan (ENTRESTO) 49-51 MG Take 1 tablet by mouth 2 (two) times daily. 60 tablet 6  . sitaGLIPtin-metformin (JANUMET) 50-1000 MG tablet Take 1 tablet by mouth 2 (two) times daily with a meal.    . spironolactone (ALDACTONE) 25 MG tablet Take 1 tablet (25 mg total) by mouth daily. 30 tablet 6  . tiotropium (SPIRIVA) 18 MCG inhalation capsule Place 18 mcg daily into inhaler and inhale.    . torsemide (DEMADEX) 20 MG tablet Take 20 mg by mouth 2 (two) times daily.    . blood glucose meter kit and supplies KIT Dispense based on patient and insurance preference. Use up to four times daily as directed. (FOR ICD-9 250.00, 250.01). For QAC - HS accuchecks. May switch to any brand. (Patient not taking: Reported on 12/17/2016) 1 each 1   No current facility-administered medications for this encounter.     Allergies  Allergen Reactions  .  Hydrocodone     Hives       Social History   Socioeconomic History  . Marital status: Married    Spouse name: Not on file  . Number of children: Not on file  . Years of education: Not on file  . Highest education level: Not on file  Social Needs  . Financial resource strain: Not on file  . Food insecurity - worry: Not on file  . Food insecurity - inability: Not on file  . Transportation needs - medical: Not on file  . Transportation needs - non-medical: Not on file  Occupational History  . Not on file  Tobacco Use  . Smoking status: Never Smoker  . Smokeless tobacco: Never Used  Substance and Sexual Activity  . Alcohol use:  No  . Drug use: No  . Sexual activity: No  Other Topics Concern  . Not on file  Social History Narrative  . Not on file   Family History  Problem Relation Age of Onset  . Diabetes Mellitus II Mother   . Heart failure Mother   . Hypertension Mother   . Heart disease Mother   . Heart failure Father   . Kidney disease Father   . Heart disease Father   . Congestive Heart Failure Neg Hx    Vitals:   12/30/16 1327  BP: (!) 142/88  Pulse: 99  SpO2: 99%  Weight: (!) 351 lb 3.2 oz (159.3 kg)   Wt Readings from Last 3 Encounters:  12/30/16 (!) 351 lb 3.2 oz (159.3 kg)  12/28/16 (!) 347 lb 7.1 oz (157.6 kg)  12/24/16 (!) 332 lb 6.4 oz (150.8 kg)    PHYSICAL EXAM: General: Obese. NAD.  HEENT: Normal Neck: Supple. JVP difficult but does not appear elevated. Carotids 2+ bilat; no bruits. No thyromegaly or nodule noted. Cor: PMI nondisplaced. RRR, No M/G/R noted Lungs: CTAB, normal effort. Abdomen: Soft, non-tender, non-distended, no HSM. No bruits or masses. +BS  Extremities: No cyanosis, clubbing, or rash. R and LLE no edema.  Neuro: Alert & orientedx3, cranial nerves grossly intact. moves all 4 extremities w/o difficulty. Affect pleasant   ECG: 11/09/16 personally reviewed: ST, LAFB  ASSESSMENT & PLAN:    1. Chronic systolic CHF due to NICM, normal cors by cath in [place] in 2017. Echo 10/2016 EF 30-35%.  - NYHA II-III symptoms - Volume status stable on exam.  - Continue torsemide 20 mg BID. - Continue metolazone prn.  - Increase Entresto to 97/103 mg BID. BMET today and 10 days. Will have him sign [place] paperwork in the event he may need moving forward.  - Continue coreg 12.5 mg BID.  - Continue spiro 25 mg daily.  - Orders have been placed to obtain medical records from [facility] center in [place], [place] - Reinforced fluid restriction to < 2 L daily, sodium restriction to less than 2000 mg daily, and the importance of daily weights.   - Continue  cardiac rehab - Repeat Echo in 2 months.   2. HTN - Meds as above.   3. DM2  - Hgb A1c is 10.8.  - Now following with Dr. Maricela Bo as PCP. Diabetes medications adjusted 11/30/16.  4. Morbid Obesity - Body mass index is 46.98 kg/m. - Has started cardiac rehab.   5. OSA/OHS - Sleep study ordered. Has previous diagnosis of OSA, but no longer has CPAP.  - Has not scheduled sleep study yet. Encouraged to call.   RTC 4 weeks with  med titration. RTC 2 months for MD visit with Echo.   Shirley Friar, PA-C 12/30/16    Greater than 50% of the 25 minute visit was spent in counseling/coordination of care regarding disease state education, salt/fluid restriction, sliding scale diuretics, and medication compliance.

## 2016-12-30 NOTE — Progress Notes (Signed)
Jonathan [name] was seen at the clinic today and reports feeling generally well. He did not bring his medications from home and they cannot remember which medication it out of refills but he thinks it is metolazone. Increasing Entresto to 97/103 mg.   Time spent with patient: 35 minutes

## 2016-12-30 NOTE — Patient Instructions (Signed)
Mr. Cepin,  It was a pleasure meeting you and your wife today. Please start taking glipizide and Januvia. If you have any issues with the medications please call the clinic.  Please follow up with me in 2 months.

## 2016-12-30 NOTE — Patient Instructions (Addendum)
INCREASE Entresto to 97/103 mg twice daily.  Routine lab work today. Will notify you of abnormal results, otherwise no news is good news!  Return in 2 weeks for labs.  ____________________________________________________________ [place] Code: 8891  Follow up 4 weeks with CHF pharmacist Elizabeth Palau.  ____________________________________________________________ [place] Code: 8002  Follow up 2 months with Dr. Gala Romney and echocardiogram.  ____________________________________________________________ [place] Code: 9000  Take all medication as prescribed the day of your appointment. Bring all medications with you to your appointment.  Do the following things EVERYDAY: 1) Weigh yourself in the morning before breakfast. Write it down and keep it in a log. 2) Take your medicines as prescribed 3) Eat low salt foods-Limit salt (sodium) to 2000 mg per day.  4) Stay as active as you can everyday 5) Limit all fluids for the day to less than 2 liters

## 2016-12-30 NOTE — Progress Notes (Addendum)
Medical Exam Form for [facility] completed and signed by Otilio Saber PA-C. Faxed to C. Bascom Levels at provided # [phone] Copy of form scanned into patient's electronic medical record.  Ave Filter, RN

## 2017-01-02 NOTE — Assessment & Plan Note (Signed)
Assessment: Uncontrolled Type II diabetes Hemgolobin A1C was 13.5 and random glucose of 404 on 11/2016.   Patient was recently started on janumet 50-1000mg  bid at that time.  Patient states he had an allergic reaction to janumet with rash.  Patient believes the metformin in the medication was the culprit as he has tried alogpliptin/metformin and had neck pain and thus considering it another bad reaction as well.  As a result he does not want to take metformin at this time.  Insulin was again recommended to the patient however he does not want to start insulin due to having to inject the medication.  Today's random glucose is 222.  Will start patient on glipizide and [place] today.    Plan -foot exam -start glipizide and januvia - Follow up in 2 months

## 2017-01-02 NOTE — Assessment & Plan Note (Signed)
Assessment:  Essential hypertension Today's blood pressure is 127/82.  Patient currently takes sacubitril-valsartan 49-51mg , carvedilol 12.5mg  BID, spironolactone 25mg  qd, metolazone 2.5mg  prn and torsemide 20mg  BID.  Plan -continue current medications

## 2017-01-02 NOTE — Assessment & Plan Note (Signed)
Assessment:  Chronic Systolic congestive heart failure Patient's last echo 10/2016 showed LVEF of 30-35%, diffuse hypokinesis and moderate concentric hypertrophy.  Patient follows with cardiology.   Patient currently takes sacubitril-valsartan 49-51mg , carvedilol 12.5mg  BID, spironolactone 25mg  qd, metolazone 2.5mg  prn and torsemide 20mg  BID.  He denies dyspnea on exertion or orthopnea  Plan -Continue current medications

## 2017-01-03 ENCOUNTER — Encounter (HOSPITAL_COMMUNITY): Payer: BLUE CROSS/BLUE SHIELD

## 2017-01-03 NOTE — Progress Notes (Signed)
Case discussed with Dr. Hoffman at the time of the visit.  We reviewed the resident's history and exam and pertinent patient test results.  I agree with the assessment, diagnosis, and plan of care documented in the resident's note. 

## 2017-01-05 ENCOUNTER — Encounter (HOSPITAL_COMMUNITY): Payer: BLUE CROSS/BLUE SHIELD

## 2017-01-05 ENCOUNTER — Telehealth (HOSPITAL_COMMUNITY): Payer: Self-pay | Admitting: Internal Medicine

## 2017-01-05 ENCOUNTER — Telehealth (HOSPITAL_COMMUNITY): Payer: Self-pay

## 2017-01-05 NOTE — Telephone Encounter (Signed)
I called Jonathan [name] to schedule an appointment. He advised that he was out with his mother getting things ready for the holiday and would not be able to meet. He also stated that he would be OK through the weekend and asked if we could meet next week. I will follow up on Tuesday.

## 2017-01-10 ENCOUNTER — Encounter (HOSPITAL_COMMUNITY)
Admission: RE | Admit: 2017-01-10 | Discharge: 2017-01-10 | Disposition: A | Discharge status: Home / Self Care | Payer: BLUE CROSS/BLUE SHIELD | Source: Ambulatory Visit | Attending: Internal Medicine | Admitting: Internal Medicine

## 2017-01-10 ENCOUNTER — Other Ambulatory Visit (HOSPITAL_COMMUNITY): Payer: Self-pay | Admitting: *Deleted

## 2017-01-10 DIAGNOSIS — I5022 Chronic systolic (congestive) heart failure: Secondary | ICD-10-CM | POA: Diagnosis present

## 2017-01-10 LAB — GLUCOSE, CAPILLARY
GLUCOSE-CAPILLARY: 187 mg/dL — AB (ref 65–99)
GLUCOSE-CAPILLARY: 165 mg/dL — AB (ref 65–99)

## 2017-01-10 MED ORDER — TORSEMIDE 20 MG PO TABS: 20.0000 mg | ORAL_TABLET | Freq: Two times a day (BID) | ORAL | 3 refills | Status: DC

## 2017-01-10 NOTE — Progress Notes (Signed)
Daily Session Note  Patient Details  Name: Micha Dosanjh MRN: [id number] Date of Birth: 1985-06-14 Referring Provider:     CARDIAC REHAB PHASE II ORIENTATION from 12/28/2016 in [place]  Referring Provider  Glori Bickers, MD.      Encounter Date: 01/10/2017  Check In: Session Check In - 01/10/17 1459      Check-In   Location  [facility]    Staff Present  Dorna Bloom, MS, ACSM RCEP, Exercise Physiologist;Joann Rion, RN, Marga Melnick, RN, Mosie Epstein, MS,ACSM CEP, Exercise Physiologist    Supervising physician immediately available to respond to emergencies  [facility] immediately available    Physician(s)  Dr Maylene Roes    Medication changes reported      No    Fall or balance concerns reported     No    Tobacco Cessation  No Change    Warm-up and Cool-down  Performed as group-led instruction    Resistance Training Performed  Yes    VAD Patient?  No      Pain Assessment   Currently in Pain?  No/denies       Capillary Blood Glucose: Results for orders placed or performed during the hospital encounter of 01/10/17 (from the past 24 hour(s))  Glucose, capillary     Status: Abnormal   Collection Time: 01/10/17  3:06 PM  Result Value Ref Range   Glucose-Capillary 187 (H) 65 - 99 mg/dL      Social History   Tobacco Use  Smoking Status Never Smoker  Smokeless Tobacco Never Used    Goals Met:  No report of cardiac concerns or symptoms  Goals Unmet:  Not Applicable  Comments: Stellan started cardiac rehab today.  Pt tolerated light exercise without difficulty. VSS, telemetry-Sinus Rhythm, Sinus tach, asymptomatic.  Medication list reconciled. Pt denies barriers to medicaiton compliance.  PSYCHOSOCIAL ASSESSMENT:  PHQ-0. Pt exhibits positive coping skills, hopeful outlook with supportive family. No psychosocial needs identified at this time, no psychosocial interventions necessary.    Pt enjoys playing video  games.   Pt oriented to exercise equipment and routine.    Understanding verbalized.Kile is interested in vocational rehab for job retraining. Patient and wife given vocational rehab packet. Karma says he has been out of his Demadex for the past few weeks because he ran out of refills. The heart failure clinic was called and notified. I emphasized to Sparsh the importance of taking his medications as prescribed.  Patient and wife states understanding. Cardell says he has not picked up a blood glucose meter because it is too expensive. I will check with our dietician to see if she has a CBG meter to give him.Will continue to monitor the patient throughout  the program.    Dr. Fransico Him is Medical Director for Cardiac Rehab at [facility].

## 2017-01-11 ENCOUNTER — Ambulatory Visit (HOSPITAL_COMMUNITY)
Admission: RE | Admit: 2017-01-11 | Discharge: 2017-01-11 | Disposition: A | Discharge status: Home / Self Care | Payer: BLUE CROSS/BLUE SHIELD | Source: Ambulatory Visit | Attending: Internal Medicine | Admitting: Internal Medicine

## 2017-01-11 DIAGNOSIS — I5022 Chronic systolic (congestive) heart failure: Secondary | ICD-10-CM | POA: Insufficient documentation

## 2017-01-11 LAB — BASIC METABOLIC PANEL
ANION GAP: 11 (ref 5–15)
BUN: 8 mg/dL (ref 6–20)
CALCIUM: 9.4 mg/dL (ref 8.9–10.3)
CHLORIDE: 100 mmol/L — AB (ref 101–111)
CO2: 26 mmol/L (ref 22–32)
Creatinine, Ser: 0.78 mg/dL (ref 0.61–1.24)
GFR calc non Af Amer: >60 mL/min (ref >60)
GLUCOSE: 271 mg/dL — AB (ref 65–99)
POTASSIUM: 3.9 mmol/L (ref 3.5–5.1)
Sodium: 137 mmol/L (ref 135–145)

## 2017-01-11 NOTE — Addendum Note (Signed)
Encounter addended by: Marcy Siren, LCSW on: 01/11/2017 3:26 PM  Actions taken: Sign clinical note

## 2017-01-11 NOTE — Progress Notes (Signed)
CSW met with patient in the clinic per his request. Patient asked questions about pending medicaid, applying for SSI and eligible for [facility] Discount program. CSW provided information and encouraged follow up. Patient will return call to CSW if further needs arise. Raquel Sarna, [place], [place]

## 2017-01-12 ENCOUNTER — Encounter (HOSPITAL_COMMUNITY)
Admission: RE | Admit: 2017-01-12 | Discharge: 2017-01-12 | Disposition: A | Discharge status: Home / Self Care | Payer: BLUE CROSS/BLUE SHIELD | Source: Ambulatory Visit | Attending: Internal Medicine | Admitting: Internal Medicine

## 2017-01-12 ENCOUNTER — Telehealth (HOSPITAL_COMMUNITY): Payer: Self-pay

## 2017-01-12 DIAGNOSIS — I5022 Chronic systolic (congestive) heart failure: Secondary | ICD-10-CM | POA: Diagnosis not present

## 2017-01-12 LAB — GLUCOSE, CAPILLARY: GLUCOSE-CAPILLARY: 157 mg/dL — AB (ref 65–99)

## 2017-01-12 NOTE — Progress Notes (Signed)
Jonathan [name] 31 y.o. male DOB: 12/27/1985 MRN: [id number]      Nutrition Note  1. Chronic systolic CHF (congestive heart failure) (HCC)    Past Medical History:  Diagnosis Date  . Asthma   . CHF (congestive heart failure) ([place])   . Hypertension   . Obesity   . Type 2 diabetes mellitus with diabetic neuropathy ([place])    Meds reviewed. Glipizide, Januvia noted  HT: Ht Readings from Last 1 Encounters:  12/28/16 6' 0.5" (1.842 m)    WT: Wt Readings from Last 3 Encounters:  12/30/16 (!) 351 lb 3.2 oz (159.3 kg)  12/30/16 (!) 348 lb 8 oz (158.1 kg)  12/28/16 (!) 347 lb 7.1 oz (157.6 kg)     BMI 46.59   Current tobacco use? No  Labs:  Lipid Panel     Component Value Date/Time   CHOL 135 10/27/2016 0221   TRIG 83 10/27/2016 0221   HDL 22 (L) 10/27/2016 0221   CHOLHDL 6.1 10/27/2016 0221   VLDL 17 10/27/2016 0221   LDLCALC 96 10/27/2016 0221    Lab Results  Component Value Date   HGBA1C 13.5 11/30/2016   CBG (last 3)  Recent Labs    01/10/17 1506 01/10/17 1558 01/12/17 1509  GLUCAP 187* 165* 157*    Nutrition Note Spoke with pt. Nutrition plan and goals reviewed with pt. Pt is following Step 1 of the Therapeutic Lifestyle Changes diet. Pt is diabetic. Last A1c indicates blood glucose poorly controlled. Pt does not like checking his CBG's "because my fingers got sore." Per pt, he has a prescription for a glucometer and strips at [facility] and "they wanted $100 for it." Pt given a Contour next  ONE glucometer. Pt to check with the [facility] pharmacy re: cost of test strips. Pt made aware of Relion glucometer and 100 strips available for ~$26. Pt has a h/o homelessness. Per discussion, pt food stamp benefits ended because pt is considered to be "able bodied." Pt states he has the [employer]. Pt denies difficulty having food available to eat at this time. Pt reports he can afford his medications at this time. Pt expressed understanding of the information  reviewed.  Nutrition Diagnosis ? Food-and nutrition-related knowledge deficit related to lack of exposure to information as related to diagnosis of: ? CHF ? DM ? Obesity related to excessive energy intake as evidenced by a BMI of 46.59  Nutrition Intervention ? Pt's individual nutrition plan and goals reviewed with pt. ? Pt given Contour next ONE blood glucose monitoring system kit  Nutrition Goal(s):  ? Pt to identify and limit food sources of saturated fat, trans fat, and sodium ? Pt to identify food quantities necessary to achieve weight loss of 6-24 lb (2.7-10.9 kg) at graduation from cardiac rehab.  ? Improved CBG concentrations as evidenced by pt's A1c trending from 13.5 to an ultimate goal of less than 7  Plan:  Pt to attend nutrition classes ? Nutrition I ? Nutrition II ? Portion Distortion ? Diabetes Blitz ? Diabetes Q & A Will provide client-centered nutrition education as part of interdisciplinary care.   Monitor and evaluate progress toward nutrition goal with team.  Derek Mound, M.Ed, RD, LDN, CDE 01/12/2017 4:12 PM

## 2017-01-12 NOTE — Progress Notes (Signed)
Incomplete Session Note  Patient Details  Name: Jonathan [name] MRN: [id number] Date of Birth: [birth year] Referring Provider:     CARDIAC REHAB PHASE II ORIENTATION from 12/28/2016 in [facility] CARDIAC [facility]  Referring Provider  Arvilla Meres, MD.      [name] did not complete his rehab session.  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 I asked [name] not to exercise today since he did not eat lunch today. Patient was counseled by the dietitian and ate a sandwich. [name] plans to return to exercise on Friday.[name], RN,BSN 01/12/2017 5:30 PM

## 2017-01-12 NOTE — Telephone Encounter (Signed)
I called Jonathan [name] to schedule an appointment. He did not answer so I left a voicemail requesting he call me back.

## 2017-01-13 NOTE — Progress Notes (Signed)
Cardiac Individual Treatment Plan  Patient Details  Name: Jonathan [name] MRN: [id number] Date of Birth: May 08, 1985 Referring Provider:     [facility] from 12/28/2016 in [place]  Referring Provider  Glori Bickers, MD.      Initial Encounter Date:    CARDIAC REHAB PHASE II ORIENTATION from 12/28/2016 in [place]  Date  12/28/16  Referring Provider  Glori Bickers, MD.      Visit Diagnosis: Chronic systolic CHF (congestive heart failure) ([place])  Patient's Home Medications on Admission:  Current Outpatient Medications:  .  albuterol (PROVENTIL HFA;VENTOLIN HFA) 108 (90 BASE) MCG/ACT inhaler, Inhale 2 puffs into the lungs every 4 (four) hours as needed for wheezing. Dispense with aerochamber (Patient not taking: Reported on 01/10/2017), Disp: 1 Inhaler, Rfl: 0 .  Amino Acids (L-CARNITINE PO), Take 1 tablet daily by mouth., Disp: , Rfl:  .  blood glucose meter kit and supplies KIT, Dispense based on patient and insurance preference. Use up to four times daily as directed. (FOR ICD-9 250.00, 250.01). For QAC - HS accuchecks. May switch to any brand. (Patient not taking: Reported on 12/17/2016), Disp: 1 each, Rfl: 1 .  carvedilol (COREG) 12.5 MG tablet, Take 1 tablet (12.5 mg total) by mouth 2 (two) times daily with a meal., Disp: 60 tablet, Rfl: 6 .  fluticasone furoate-vilanterol (BREO ELLIPTA) 200-25 MCG/INH AEPB, Inhale 1 puff into the lungs daily., Disp: , Rfl:  .  glipiZIDE (GLUCOTROL XL) 2.5 MG 24 hr tablet, Take 1 tablet (2.5 mg total) daily with breakfast by mouth., Disp: 30 tablet, Rfl: 2 .  ibuprofen (ADVIL,MOTRIN) 800 MG tablet, Take 800 mg by mouth every 8 (eight) hours., Disp: , Rfl:  .  metolazone (ZAROXOLYN) 2.5 MG tablet, Take 1 tablet (2.5 mg total) by mouth as needed. As directed by HF clinic for weight gain of 3 lbs overnight or 5 lbs within one week. (Patient not taking: Reported  on 01/10/2017), Disp: 10 tablet, Rfl: 2 .  sacubitril-valsartan (ENTRESTO) 97-103 MG, Take 1 tablet 2 (two) times daily by mouth., Disp: 60 tablet, Rfl: 6 .  sitaGLIPtin (JANUVIA) 100 MG tablet, Take 1 tablet (100 mg total) daily by mouth., Disp: 30 tablet, Rfl: 2 .  sitaGLIPtin-metformin (JANUMET) 50-1000 MG tablet, Take 1 tablet by mouth 2 (two) times daily with a meal., Disp: , Rfl:  .  spironolactone (ALDACTONE) 25 MG tablet, Take 1 tablet (25 mg total) by mouth daily., Disp: 30 tablet, Rfl: 6 .  tiotropium (SPIRIVA) 18 MCG inhalation capsule, Place 18 mcg daily into inhaler and inhale., Disp: , Rfl:  .  torsemide (DEMADEX) 20 MG tablet, Take 1 tablet (20 mg total) by mouth 2 (two) times daily. (Patient not taking: Reported on 01/10/2017), Disp: 60 tablet, Rfl: 3  Past Medical History: Past Medical History:  Diagnosis Date  . Asthma   . CHF (congestive heart failure) ([place])   . Hypertension   . Obesity   . Type 2 diabetes mellitus with diabetic neuropathy (HCC)     Tobacco Use: Social History   Tobacco Use  Smoking Status Never Smoker  Smokeless Tobacco Never Used    Labs: Recent Review Flowsheet Data    Labs for ITP Cardiac and Pulmonary Rehab Latest Ref Rng & Units 04/19/2016 10/27/2016 11/30/2016   Cholestrol 0 - 200 mg/dL - 135 -   LDLCALC 0 - 99 mg/dL - 96 -   HDL >40 mg/dL - 22(L) -  Trlycerides <150 mg/dL - 83 -   Hemoglobin A1c - - 10.8(H) 13.5   HCO3 20.0 - 28.0 mmol/L 22.7 - -   TCO2 0 - 100 mmol/L 24 - -   ACIDBASEDEF 0.0 - 2.0 mmol/L 2.0 - -   O2SAT % 72.0 - -      Capillary Blood Glucose: Lab Results  Component Value Date   GLUCAP 157 (H) 01/12/2017   GLUCAP 165 (H) 01/10/2017   GLUCAP 187 (H) 01/10/2017   GLUCAP 222 (H) 12/30/2016   GLUCAP 404 (H) 11/30/2016     Exercise Target Goals:    Exercise Program Goal: Individual exercise prescription set with THRR, safety & activity barriers. Participant demonstrates ability to understand and report RPE  using BORG scale, to self-measure pulse accurately, and to acknowledge the importance of the exercise prescription.  Exercise Prescription Goal: Starting with aerobic activity 30 plus minutes a day, 3 days per week for initial exercise prescription. Provide home exercise prescription and guidelines that participant acknowledges understanding prior to discharge.  Activity Barriers & Risk Stratification: Activity Barriers & Cardiac Risk Stratification - 12/28/16 1517      Activity Barriers & Cardiac Risk Stratification   Activity Barriers  Other (comment);Back Problems    Comments  Reconstructive surgery, both feet. Plate and screw in both feet.    Cardiac Risk Stratification  High       6 Minute Walk: 6 Minute Walk    Row Name 12/28/16 1539         6 Minute Walk   Phase  Initial     Distance  1277 feet     Walk Time  6 minutes     # of Rest Breaks  0     MPH  2.42     METS  4.14     RPE  11     VO2 Peak  14.49     Symptoms  Yes (comment)     Comments  Tingling in feet at end of walk test.     Resting HR  92 bpm     Resting BP  124/80     Resting Oxygen Saturation   98 %     Exercise Oxygen Saturation  during 6 min walk  97 %     Max Ex. HR  131 bpm     Max Ex. BP  136/84     2 Minute Post BP  129/85        Oxygen Initial Assessment:   Oxygen Re-Evaluation:   Oxygen Discharge (Final Oxygen Re-Evaluation):   Initial Exercise Prescription: Initial Exercise Prescription - 12/28/16 1500      Date of Initial Exercise RX and Referring Provider   Date  12/28/16    Referring Provider  Glori Bickers, MD.      NuStep   Level  4    SPM  85    Minutes  10    METs  2.5      Arm Ergometer   Level  3    Minutes  10    METs  2.5      Track   Laps  9    Minutes  10    METs  2.57      Prescription Details   Frequency (times per week)  3    Duration  Progress to 30 minutes of continuous aerobic without signs/symptoms of physical distress      Intensity    THRR 40-80% of Max Heartrate  76-151    Ratings of Perceived Exertion  11-13    Perceived Dyspnea  0-4      Progression   Progression  Continue to progress workloads to maintain intensity without signs/symptoms of physical distress.      Resistance Training   Training Prescription  Yes    Weight  3lbs    Reps  10-15       Perform Capillary Blood Glucose checks as needed.  Exercise Prescription Changes: Exercise Prescription Changes    Row Name 01/10/17 1447             Response to Exercise   Blood Pressure (Admit)  122/82       Blood Pressure (Exercise)  122/74       Blood Pressure (Exit)  136/70       Heart Rate (Admit)  93 bpm       Heart Rate (Exercise)  119 bpm       Heart Rate (Exit)  100 bpm       Rating of Perceived Exertion (Exercise)  11       Symptoms  none       Duration  Progress to 30 minutes of  aerobic without signs/symptoms of physical distress       Intensity  THRR unchanged         Progression   Progression  Continue to progress workloads to maintain intensity without signs/symptoms of physical distress.       Average METs  2.1         Resistance Training   Training Prescription  Yes       Weight  3lbs       Reps  10-15       Time  10 Minutes         NuStep   Level  4       SPM  85       Minutes  10       METs  2.1         Arm Ergometer   Level  3       Minutes  10       METs  1.83         Track   Laps  4       Minutes  5       METs  2.39          Exercise Comments: Exercise Comments    Row Name 01/12/17 1630           Exercise Comments  Patient tolerated 1st session of exercise well, but no exercise day 2 because he hadn't eaten today.          Exercise Goals and Review: Exercise Goals    Row Name 12/28/16 1536             Exercise Goals   Increase Physical Activity  Yes       Intervention  Provide advice, education, support and counseling about physical activity/exercise needs.;Develop an individualized exercise  prescription for aerobic and resistive training based on initial evaluation findings, risk stratification, comorbidities and participant's personal goals.       Expected Outcomes  Achievement of increased cardiorespiratory fitness and enhanced flexibility, muscular endurance and strength shown through measurements of functional capacity and personal statement of participant.       Increase Strength and Stamina  Yes       Intervention  Provide advice, education, support and counseling about  physical activity/exercise needs.;Develop an individualized exercise prescription for aerobic and resistive training based on initial evaluation findings, risk stratification, comorbidities and participant's personal goals.       Expected Outcomes  Achievement of increased cardiorespiratory fitness and enhanced flexibility, muscular endurance and strength shown through measurements of functional capacity and personal statement of participant.       Able to understand and use rate of perceived exertion (RPE) scale  Yes       Intervention  Provide education and explanation on how to use RPE scale       Expected Outcomes  Short Term: Able to use RPE daily in rehab to express subjective intensity level;Long Term:  Able to use RPE to guide intensity level when exercising independently       Knowledge and understanding of Target Heart Rate Range (THRR)  Yes       Intervention  Provide education and explanation of THRR including how the numbers were predicted and where they are located for reference       Expected Outcomes  Short Term: Able to state/look up THRR;Long Term: Able to use THRR to govern intensity when exercising independently;Short Term: Able to use daily as guideline for intensity in rehab       Able to check pulse independently  Yes       Intervention  Provide education and demonstration on how to check pulse in carotid and radial arteries.;Review the importance of being able to check your own pulse for safety  during independent exercise       Expected Outcomes  Short Term: Able to explain why pulse checking is important during independent exercise;Long Term: Able to check pulse independently and accurately       Understanding of Exercise Prescription  Yes       Intervention  Provide education, explanation, and written materials on patient's individual exercise prescription       Expected Outcomes  Short Term: Able to explain program exercise prescription;Long Term: Able to explain home exercise prescription to exercise independently          Exercise Goals Re-Evaluation : Exercise Goals Re-Evaluation    Row Name 01/12/17 1631             Exercise Goal Re-Evaluation   Exercise Goals Review  Increase Physical Activity       Comments  Patient tolerated 1st day of exercise well.       Expected Outcomes  Increase workloads as tolerated.           Discharge Exercise Prescription (Final Exercise Prescription Changes): Exercise Prescription Changes - 01/10/17 1447      Response to Exercise   Blood Pressure (Admit)  122/82    Blood Pressure (Exercise)  122/74    Blood Pressure (Exit)  136/70    Heart Rate (Admit)  93 bpm    Heart Rate (Exercise)  119 bpm    Heart Rate (Exit)  100 bpm    Rating of Perceived Exertion (Exercise)  11    Symptoms  none    Duration  Progress to 30 minutes of  aerobic without signs/symptoms of physical distress    Intensity  THRR unchanged      Progression   Progression  Continue to progress workloads to maintain intensity without signs/symptoms of physical distress.    Average METs  2.1      Resistance Training   Training Prescription  Yes    Weight  3lbs    Reps  10-15  Time  10 Minutes      NuStep   Level  4    SPM  85    Minutes  10    METs  2.1      Arm Ergometer   Level  3    Minutes  10    METs  1.83      Track   Laps  4    Minutes  5    METs  2.39       Nutrition:  Target Goals: Understanding of nutrition guidelines, daily  intake of sodium <1563m, cholesterol <2026m calories 30% from fat and 7% or less from saturated fats, daily to have 5 or more servings of fruits and vegetables.  Biometrics: Pre Biometrics - 12/28/16 1644      Pre Biometrics   Height  6' 0.5" (1.842 m)    Weight  347 lb 7.1 oz (157.6 kg)  (Abnormal)     Waist Circumference  51.75 inches    Hip Circumference  58 inches    Waist to Hip Ratio  0.89 %    BMI (Calculated)  46.45    Triceps Skinfold  40 mm    % Body Fat  42.3 %    Grip Strength  32.5 kg    Single Leg Stand  1.25 seconds        Nutrition Therapy Plan and Nutrition Goals: Nutrition Therapy & Goals - 01/12/17 1628      Nutrition Therapy   Diet  Carb Modified, Heart Healthy      Personal Nutrition Goals   Nutrition Goal  Pt to identify and limit food sources of saturated fat, trans fat, and sodium    Personal Goal #2  Pt to identify food quantities necessary to achieve weight loss of 6-24 lb (2.7-10.9 kg) at graduation from cardiac rehab.     Personal Goal #3  Improved CBG concentrations as evidenced by pt's A1c trending from 13.5 to an ultimate goal of less than 7      Intervention Plan   Intervention  Prescribe, educate and counsel regarding individualized specific dietary modifications aiming towards targeted core components such as weight, hypertension, lipid management, diabetes, heart failure and other comorbidities.    Expected Outcomes  Short Term Goal: Understand basic principles of dietary content, such as calories, fat, sodium, cholesterol and nutrients.;Long Term Goal: Adherence to prescribed nutrition plan.       Nutrition Discharge: Nutrition Scores: Nutrition Assessments - 01/12/17 1629      MEDFICTS Scores   Pre Score  47       Nutrition Goals Re-Evaluation:   Nutrition Goals Re-Evaluation:   Nutrition Goals Discharge (Final Nutrition Goals Re-Evaluation):   Psychosocial: Target Goals: Acknowledge presence or absence of significant  depression and/or stress, maximize coping skills, provide positive support system. Participant is able to verbalize types and ability to use techniques and skills needed for reducing stress and depression.  Initial Review & Psychosocial Screening: Initial Psych Review & Screening - 12/28/16 1643      Screening Interventions   Interventions  Encouraged to exercise;To provide support and resources with identified psychosocial needs;Provide feedback about the scores to participant       Quality of Life Scores: Quality of Life - 12/28/16 1624      Quality of Life Scores   Health/Function Pre  13.2 %    Socioeconomic Pre  16.29 %    Psych/Spiritual Pre  19.71 %    Family Pre  21 %  GLOBAL Pre  16.18 %       PHQ-9: Recent Review Flowsheet Data    Depression screen [facility] 2/9 01/10/2017 12/30/2016 11/30/2016   Decreased Interest 0 0 3   Down, Depressed, Hopeless _0 PHQ - 2 Score _1 Altered sleeping - - 3   Tired, decreased energy - - 3   Change in appetite - - 2   Feeling bad or failure about yourself  - - 2   Trouble concentrating - - 3   Moving slowly or fidgety/restless - - 3   Suicidal thoughts - - 0   PHQ-9 Score - - 22   Difficult doing work/chores - - Extremely dIfficult     Interpretation of Total Score  Total Score Depression Severity:  1-4 = Minimal depression, 5-9 = Mild depression, 10-14 = Moderate depression, 15-19 = Moderately severe depression, 20-27 = Severe depression   Psychosocial Evaluation and Intervention:   Psychosocial Re-Evaluation:   Psychosocial Discharge (Final Psychosocial Re-Evaluation):   Vocational Rehabilitation: Provide vocational rehab assistance to qualifying candidates.   Vocational Rehab Evaluation & Intervention: Vocational Rehab - 12/28/16 1640      Initial Vocational Rehab Evaluation & Intervention   Assessment shows need for Vocational Rehabilitation  Yes    Vocational Rehab Packet given to patient  12/28/16         Education: Education Goals: Education classes will be provided on a weekly basis, covering required topics. Participant will state understanding/return demonstration of topics presented.  Learning Barriers/Preferences: Learning Barriers/Preferences - 12/28/16 1640      Learning Barriers/Preferences   Learning Barriers  Sight    Financial risk analyst;Computer/Internet;Group Instruction;Individual Instruction;Pictoral;Skilled Demonstration;Verbal Instruction;Video;Written Material       Education Topics: Count Your Pulse:  -Group instruction provided by verbal instruction, demonstration, patient participation and written materials to support subject.  Instructors address importance of being able to find your pulse and how to count your pulse when at home without a heart monitor.  Patients get hands on experience counting their pulse with staff help and individually.   Heart Attack, Angina, and Risk Factor Modification:  -Group instruction provided by verbal instruction, video, and written materials to support subject.  Instructors address signs and symptoms of angina and heart attacks.    Also discuss risk factors for heart disease and how to make changes to improve heart health risk factors.   Functional Fitness:  -Group instruction provided by verbal instruction, demonstration, patient participation, and written materials to support subject.  Instructors address safety measures for doing things around the house.  Discuss how to get up and down off the floor, how to pick things up properly, how to safely get out of a chair without assistance, and balance training.   Meditation and Mindfulness:  -Group instruction provided by verbal instruction, patient participation, and written materials to support subject.  Instructor addresses importance of mindfulness and meditation practice to help reduce stress and improve awareness.  Instructor also leads participants through a meditation  exercise.    Stretching for Flexibility and Mobility:  -Group instruction provided by verbal instruction, patient participation, and written materials to support subject.  Instructors lead participants through series of stretches that are designed to increase flexibility thus improving mobility.  These stretches are additional exercise for major muscle groups that are typically performed during regular warm up and cool down.   Hands Only CPR:  -Group verbal, video, and participation provides a basic overview of  AHA guidelines for community CPR. Role-play of emergencies allow participants the opportunity to practice calling for help and chest compression technique with discussion of AED use.   Hypertension: -Group verbal and written instruction that provides a basic overview of hypertension including the most recent diagnostic guidelines, risk factor reduction with self-care instructions and medication management.    Nutrition I class: Heart Healthy Eating:  -Group instruction provided by PowerPoint slides, verbal discussion, and written materials to support subject matter. The instructor gives an explanation and review of the Therapeutic Lifestyle Changes diet recommendations, which includes a discussion on lipid goals, dietary fat, sodium, fiber, plant stanol/sterol esters, sugar, and the components of a well-balanced, healthy diet.   Nutrition II class: Lifestyle Skills:  -Group instruction provided by PowerPoint slides, verbal discussion, and written materials to support subject matter. The instructor gives an explanation and review of label reading, grocery shopping for heart health, heart healthy recipe modifications, and ways to make healthier choices when eating out.   Diabetes Question & Answer:  -Group instruction provided by PowerPoint slides, verbal discussion, and written materials to support subject matter. The instructor gives an explanation and review of diabetes  co-morbidities, pre- and post-prandial blood glucose goals, pre-exercise blood glucose goals, signs, symptoms, and treatment of hypoglycemia and hyperglycemia, and foot care basics.   Diabetes Blitz:  -Group instruction provided by PowerPoint slides, verbal discussion, and written materials to support subject matter. The instructor gives an explanation and review of the physiology behind type 1 and type 2 diabetes, diabetes medications and rational behind using different medications, pre- and post-prandial blood glucose recommendations and Hemoglobin A1c goals, diabetes diet, and exercise including blood glucose guidelines for exercising safely.    Portion Distortion:  -Group instruction provided by PowerPoint slides, verbal discussion, written materials, and food models to support subject matter. The instructor gives an explanation of serving size versus portion size, changes in portions sizes over the last 20 years, and what consists of a serving from each food group.   Stress Management:  -Group instruction provided by verbal instruction, video, and written materials to support subject matter.  Instructors review role of stress in heart disease and how to cope with stress positively.     Exercising on Your Own:  -Group instruction provided by verbal instruction, power point, and written materials to support subject.  Instructors discuss benefits of exercise, components of exercise, frequency and intensity of exercise, and end points for exercise.  Also discuss use of nitroglycerin and activating EMS.  Review options of places to exercise outside of rehab.  Review guidelines for sex with heart disease.   Cardiac Drugs I:  -Group instruction provided by verbal instruction and written materials to support subject.  Instructor reviews cardiac drug classes: antiplatelets, anticoagulants, beta blockers, and statins.  Instructor discusses reasons, side effects, and lifestyle considerations for each  drug class.   Cardiac Drugs II:  -Group instruction provided by verbal instruction and written materials to support subject.  Instructor reviews cardiac drug classes: angiotensin converting enzyme inhibitors (ACE-I), angiotensin II receptor blockers (ARBs), nitrates, and calcium channel blockers.  Instructor discusses reasons, side effects, and lifestyle considerations for each drug class.   Anatomy and Physiology of the Circulatory System:  Group verbal and written instruction and models provide basic cardiac anatomy and physiology, with the coronary electrical and arterial systems. Review of: AMI, Angina, Valve disease, Heart Failure, Peripheral Artery Disease, Cardiac Arrhythmia, Pacemakers, and the ICD.   CARDIAC REHAB PHASE II EXERCISE from 01/12/2017 in  [place]  Date  01/12/17  Instruction Review Code  2- meets goals/outcomes      Other Education:  -Group or individual verbal, written, or video instructions that support the educational goals of the cardiac rehab program.   Knowledge Questionnaire Score: Knowledge Questionnaire Score - 12/28/16 1627      Knowledge Questionnaire Score   Pre Score  18/24       Core Components/Risk Factors/Patient Goals at Admission: Personal Goals and Risk Factors at Admission - 12/28/16 1439      Core Components/Risk Factors/Patient Goals on Admission    Weight Management  Yes;Obesity;Weight Maintenance;Weight Loss    Intervention  Weight Management: Provide education and appropriate resources to help participant work on and attain dietary goals.;Weight Management: Develop a combined nutrition and exercise program designed to reach desired caloric intake, while maintaining appropriate intake of nutrient and fiber, sodium and fats, and appropriate energy expenditure required for the weight goal.;Weight Management/Obesity: Establish reasonable short term and long term weight goals.;Obesity: Provide education and  appropriate resources to help participant work on and attain dietary goals.    Expected Outcomes  Short Term: Continue to assess and modify interventions until short term weight is achieved;Weight Maintenance: Understanding of the daily nutrition guidelines, which includes 25-35% calories from fat, 7% or less cal from saturated fats, less than 249m cholesterol, less than 1.5gm of sodium, & 5 or more servings of fruits and vegetables daily;Long Term: Adherence to nutrition and physical activity/exercise program aimed toward attainment of established weight goal;Weight Loss: Understanding of general recommendations for a balanced deficit meal plan, which promotes 1-2 lb weight loss per week and includes a negative energy balance of [phone] kcal/d;Understanding recommendations for meals to include 15-35% energy as protein, 25-35% energy from fat, 35-60% energy from carbohydrates, less than 2070mof dietary cholesterol, 20-35 gm of total fiber daily;Understanding of distribution of calorie intake throughout the day with the consumption of 4-5 meals/snacks    Diabetes  Yes    Intervention  Provide education about signs/symptoms and action to take for hypo/hyperglycemia.;Provide education about proper nutrition, including hydration, and aerobic/resistive exercise prescription along with prescribed medications to achieve blood glucose in normal ranges: Fasting glucose 65-99 mg/dL    Expected Outcomes  Short Term: Participant verbalizes understanding of the signs/symptoms and immediate care of hyper/hypoglycemia, proper foot care and importance of medication, aerobic/resistive exercise and nutrition plan for blood glucose control.;Long Term: Attainment of HbA1C < 7%.    Heart Failure  Yes    Intervention  Provide a combined exercise and nutrition program that is supplemented with education, support and counseling about heart failure. Directed toward relieving symptoms such as shortness of breath, decreased exercise  tolerance, and extremity edema.    Expected Outcomes  Improve functional capacity of life;Short term: Attendance in program 2-3 days a week with increased exercise capacity. Reported lower sodium intake. Reported increased fruit and vegetable intake. Reports medication compliance.;Short term: Daily weights obtained and reported for increase. Utilizing diuretic protocols set by physician.;Long term: Adoption of self-care skills and reduction of barriers for early signs and symptoms recognition and intervention leading to self-care maintenance.    Hypertension  Yes    Intervention  Provide education on lifestyle modifcations including regular physical activity/exercise, weight management, moderate sodium restriction and increased consumption of fresh fruit, vegetables, and low fat dairy, alcohol moderation, and smoking cessation.;Monitor prescription use compliance.    Expected Outcomes  Short Term: Continued assessment and intervention until BP is <  140/38m HG in hypertensive participants. < 130/852mHG in hypertensive participants with diabetes, heart failure or chronic kidney disease.;Long Term: Maintenance of blood pressure at goal levels.    Stress  Yes    Intervention  Offer individual and/or small group education and counseling on adjustment to heart disease, stress management and health-related lifestyle change. Teach and support self-help strategies.;Refer participants experiencing significant psychosocial distress to appropriate mental health specialists for further evaluation and treatment. When possible, include family members and significant others in education/counseling sessions.    Expected Outcomes  Short Term: Participant demonstrates changes in health-related behavior, relaxation and other stress management skills, ability to obtain effective social support, and compliance with psychotropic medications if prescribed.;Long Term: Emotional wellbeing is indicated by absence of clinically  significant psychosocial distress or social isolation.       Core Components/Risk Factors/Patient Goals Review:    Core Components/Risk Factors/Patient Goals at Discharge (Final Review):    ITP Comments: ITP Comments    Row Name 12/28/16 1407 01/13/17 1739         ITP Comments  Dr TrFransico HimMedical Director  30 Day ITP Review. Treson started cardiac rehab this week         Comments: See ITP comments.MaBarnet PallRN,BSN 01/13/2017 5:45 PM

## 2017-01-14 ENCOUNTER — Telehealth (HOSPITAL_COMMUNITY): Payer: Self-pay

## 2017-01-14 ENCOUNTER — Encounter (HOSPITAL_COMMUNITY)
Admission: RE | Admit: 2017-01-14 | Discharge: 2017-01-14 | Disposition: A | Discharge status: Home / Self Care | Payer: BLUE CROSS/BLUE SHIELD | Source: Ambulatory Visit | Attending: Internal Medicine | Admitting: Internal Medicine

## 2017-01-14 DIAGNOSIS — I5022 Chronic systolic (congestive) heart failure: Secondary | ICD-10-CM

## 2017-01-14 LAB — GLUCOSE, CAPILLARY
GLUCOSE-CAPILLARY: 216 mg/dL — AB (ref 65–99)
Glucose-Capillary: 185 mg/dL — ABNORMAL HIGH (ref 65–99)

## 2017-01-14 NOTE — Telephone Encounter (Signed)
I called Jonathan [name] today to follow up after not hearing back from 2 days ago. He stated his phone has been messing up and he has not gotten any messages from me and others have had the same issue trying to contact him. He was not able to meed today but requested I come Tuesday at 09:00.

## 2017-01-17 ENCOUNTER — Encounter (HOSPITAL_COMMUNITY): Payer: BLUE CROSS/BLUE SHIELD

## 2017-01-18 ENCOUNTER — Other Ambulatory Visit (HOSPITAL_COMMUNITY): Payer: Self-pay

## 2017-01-18 NOTE — Progress Notes (Signed)
Paramedicine Encounter    Patient ID: Jonathan [name], male    DOB: 10/26/1985, 31 y.o.   MRN: [id number]   Patient Care Team: Valinda Party, DO as PCP - General (Internal Medicine)  Patient Active Problem List   Diagnosis Date Noted  . OSA (obstructive sleep apnea) 12/02/2016  . Chronic systolic (congestive) heart failure ([place])   . Obesity, Class III, BMI 40-49.9 (morbid obesity) ([place]) 10/26/2016  . Asthma, chronic, mild persistent, uncomplicated 82/50/5397  . Essential hypertension 10/26/2016  . Diabetes mellitus type 2 in obese ([place]) 04/20/2016    Current Outpatient Medications:  .  Amino Acids (L-CARNITINE PO), Take 1 tablet daily by mouth., Disp: , Rfl:  .  carvedilol (COREG) 12.5 MG tablet, Take 1 tablet (12.5 mg total) by mouth 2 (two) times daily with a meal., Disp: 60 tablet, Rfl: 6 .  glipiZIDE (GLUCOTROL XL) 2.5 MG 24 hr tablet, Take 1 tablet (2.5 mg total) daily with breakfast by mouth., Disp: 30 tablet, Rfl: 2 .  sacubitril-valsartan (ENTRESTO) 97-103 MG, Take 1 tablet 2 (two) times daily by mouth., Disp: 60 tablet, Rfl: 6 .  sitaGLIPtin (JANUVIA) 100 MG tablet, Take 1 tablet (100 mg total) daily by mouth., Disp: 30 tablet, Rfl: 2 .  spironolactone (ALDACTONE) 25 MG tablet, Take 1 tablet (25 mg total) by mouth daily., Disp: 30 tablet, Rfl: 6 .  torsemide (DEMADEX) 20 MG tablet, Take 1 tablet (20 mg total) by mouth 2 (two) times daily., Disp: 60 tablet, Rfl: 3 .  albuterol (PROVENTIL HFA;VENTOLIN HFA) 108 (90 BASE) MCG/ACT inhaler, Inhale 2 puffs into the lungs every 4 (four) hours as needed for wheezing. Dispense with aerochamber (Patient not taking: Reported on 01/10/2017), Disp: 1 Inhaler, Rfl: 0 .  blood glucose meter kit and supplies KIT, Dispense based on patient and insurance preference. Use up to four times daily as directed. (FOR ICD-9 250.00, 250.01). For QAC - HS accuchecks. May switch to any brand. (Patient not taking: Reported on 12/17/2016), Disp: 1 each,  Rfl: 1 .  fluticasone furoate-vilanterol (BREO ELLIPTA) 200-25 MCG/INH AEPB, Inhale 1 puff into the lungs daily., Disp: , Rfl:  .  ibuprofen (ADVIL,MOTRIN) 800 MG tablet, Take 800 mg by mouth every 8 (eight) hours., Disp: , Rfl:  .  metolazone (ZAROXOLYN) 2.5 MG tablet, Take 1 tablet (2.5 mg total) by mouth as needed. As directed by HF clinic for weight gain of 3 lbs overnight or 5 lbs within one week. (Patient not taking: Reported on 01/10/2017), Disp: 10 tablet, Rfl: 2 .  sitaGLIPtin-metformin (JANUMET) 50-1000 MG tablet, Take 1 tablet by mouth 2 (two) times daily with a meal., Disp: , Rfl:  .  tiotropium (SPIRIVA) 18 MCG inhalation capsule, Place 18 mcg daily into inhaler and inhale., Disp: , Rfl:  Allergies  Allergen Reactions  . Hydrocodone     Hives       Social History   Socioeconomic History  . Marital status: Married    Spouse name: Not on file  . Number of children: Not on file  . Years of education: Not on file  . Highest education level: Not on file  Social Needs  . Financial resource strain: Not on file  . Food insecurity - worry: Not on file  . Food insecurity - inability: Not on file  . Transportation needs - medical: Not on file  . Transportation needs - non-medical: Not on file  Occupational History  . Not on file  Tobacco Use  . Smoking status: Never  Smoker  . Smokeless tobacco: Never Used  Substance and Sexual Activity  . Alcohol use: No  . Drug use: No  . Sexual activity: No  Other Topics Concern  . Not on file  Social History Narrative  . Not on file    Physical Exam  Constitutional: He is oriented to person, place, and time.  Cardiovascular: Normal rate and regular rhythm.  Pulmonary/Chest: Effort normal and breath sounds normal. No respiratory distress. He has no wheezes. He has no rales.  Musculoskeletal: Normal range of motion. He exhibits no edema.  Neurological: He is alert and oriented to person, place, and time.  Skin: Skin is warm and  dry.  Psychiatric: He has a normal mood and affect.        Future Appointments  Date Time Provider [place]  01/19/2017  2:45 PM MC-PHASE2 MONITOR 1 MC-REHSC None  01/21/2017  2:45 PM MC-PHASE2 MONITOR 1 MC-REHSC None  01/24/2017  2:45 PM MC-PHASE2 MONITOR 1 MC-REHSC None  01/26/2017  2:45 PM MC-PHASE2 MONITOR 1 MC-REHSC None  01/27/2017 10:00 AM MC-HVSC PHARMACY MC-[facility]SC None  01/28/2017  2:45 PM MC-PHASE2 MONITOR 1 MC-REHSC None  01/31/2017  2:45 PM MC-PHASE2 MONITOR 1 MC-REHSC None  02/02/2017  2:45 PM MC-PHASE2 MONITOR 1 MC-REHSC None  02/04/2017  2:45 PM MC-PHASE2 MONITOR 1 MC-REHSC None  02/07/2017  2:45 PM MC-PHASE2 MONITOR 1 MC-REHSC None  02/09/2017  2:45 PM MC-PHASE2 MONITOR 1 MC-REHSC None  02/11/2017  2:45 PM MC-PHASE2 MONITOR 1 MC-REHSC None  02/14/2017  2:45 PM MC-PHASE2 MONITOR 1 MC-REHSC None  02/16/2017  2:45 PM MC-PHASE2 MONITOR 1 MC-REHSC None  02/18/2017  2:45 PM MC-PHASE2 MONITOR 1 MC-REHSC None  02/21/2017  2:45 PM MC-PHASE2 MONITOR 1 MC-REHSC None  02/23/2017  2:45 PM MC-PHASE2 MONITOR 1 MC-REHSC None  02/25/2017  2:45 PM MC-PHASE2 MONITOR 1 MC-REHSC None  02/28/2017  2:45 PM MC-PHASE2 MONITOR 1 MC-REHSC None  03/02/2017  9:00 AM MC ECHO 1-BUZZ MC-ECHOLAB [facility]  03/02/2017 10:00 AM Bensimhon, Shaune Pascal, MD MC-HVSC None  03/02/2017  2:45 PM MC-PHASE2 MONITOR 1 MC-REHSC None  03/03/2017 10:20 AM Sueanne Margarita, MD [facility]  03/04/2017  2:45 PM MC-PHASE2 MONITOR 1 MC-REHSC None  03/07/2017  2:45 PM MC-PHASE2 MONITOR 1 MC-REHSC None  03/09/2017  2:45 PM MC-PHASE2 MONITOR 1 MC-REHSC None  03/10/2017  1:15 PM Valinda Party, DO IMP-IMCR [facility]  03/11/2017  2:45 PM MC-PHASE2 MONITOR 1 MC-REHSC None  03/14/2017  2:45 PM MC-PHASE2 MONITOR 1 MC-REHSC None  03/16/2017  2:45 PM MC-PHASE2 MONITOR 1 MC-REHSC None  03/18/2017  2:45 PM MC-PHASE2 MONITOR 1 MC-REHSC None  03/21/2017  2:45 PM MC-PHASE2 MONITOR 1 MC-REHSC None  03/23/2017  2:45 PM MC-PHASE2 MONITOR 1  MC-REHSC None  03/25/2017  2:45 PM MC-PHASE2 MONITOR 1 MC-REHSC None    BP 110/82 (BP Location: Left Arm, Patient Position: Sitting, Cuff Size: Large)   Pulse 90   Resp 16   Wt (!) 337 lb 12.8 oz (153.2 kg)   SpO2 97%   BMI 45.18 kg/m   Weight yesterday- Does not remember Last visit weight- 351.3 lb CBG- 208 mg/dl  Jonathan [name] was seen at home and reported feeling well. He denied SOB, headache, dizziness or orthopnea. He reports taking all his medications but still does not want me to fill his pillbox but I was able to verify his medications. He is still not writing down his weights daily despite me reminding him weekly.   Time spent with patient: 66  minutes  [name], EMT 01/18/17  ACTION: Home visit completed Next visit planned for 1 week

## 2017-01-19 ENCOUNTER — Encounter (HOSPITAL_COMMUNITY)
Admission: RE | Admit: 2017-01-19 | Discharge: 2017-01-19 | Disposition: A | Discharge status: Home / Self Care | Payer: BLUE CROSS/BLUE SHIELD | Source: Ambulatory Visit | Attending: Internal Medicine | Admitting: Internal Medicine

## 2017-01-19 DIAGNOSIS — I5022 Chronic systolic (congestive) heart failure: Secondary | ICD-10-CM

## 2017-01-19 LAB — GLUCOSE, CAPILLARY: GLUCOSE-CAPILLARY: 148 mg/dL — AB (ref 65–99)

## 2017-01-19 NOTE — Progress Notes (Signed)
.  I have reviewed a Home Exercise Prescription with [employer] . Jonathan [name] is currently walking his dog 15 minutes 2-3x day. The patient was advised to walk 2-4 days a week for 30-45 minutes.  Sebastin and I discussed how to progress their exercise prescription. The patient stated that his goal was to start doing yoga 1-2x a week.  The patient stated that they understand the exercise prescription. We reviewed exercise guidelines, target heart rate during exercise, weather, endpoints for exercise, and goals.  Patient is encouraged to come to me with any questions. I will continue to follow up with the patient to assist them with progression and safety.    York Cerise MS, ACSM CEP 01/19/2017

## 2017-01-21 ENCOUNTER — Encounter (HOSPITAL_COMMUNITY)
Admission: RE | Admit: 2017-01-21 | Discharge: 2017-01-21 | Disposition: A | Discharge status: Home / Self Care | Payer: BLUE CROSS/BLUE SHIELD | Source: Ambulatory Visit | Attending: Internal Medicine | Admitting: Internal Medicine

## 2017-01-21 DIAGNOSIS — I5022 Chronic systolic (congestive) heart failure: Secondary | ICD-10-CM | POA: Diagnosis not present

## 2017-01-24 ENCOUNTER — Encounter (HOSPITAL_COMMUNITY): Payer: BLUE CROSS/BLUE SHIELD

## 2017-01-24 LAB — GLUCOSE, CAPILLARY: Glucose-Capillary: 172 mg/dL — ABNORMAL HIGH (ref 65–99)

## 2017-01-26 ENCOUNTER — Encounter (HOSPITAL_COMMUNITY): Payer: BLUE CROSS/BLUE SHIELD

## 2017-01-27 ENCOUNTER — Telehealth (HOSPITAL_COMMUNITY): Payer: Self-pay

## 2017-01-27 ENCOUNTER — Ambulatory Visit (HOSPITAL_COMMUNITY): Payer: BLUE CROSS/BLUE SHIELD

## 2017-01-27 NOTE — Telephone Encounter (Signed)
I called Mr Brading from the HF clinic because he had not shown up for the appointment he had scheduled. He advised that some things had come up and he was not going to be able to make it today. I asked if I could come see him at home but he said he would not be able to meet until tomorrow morning. We scheduled an appointment for 09:00 tomorrow.

## 2017-01-28 ENCOUNTER — Other Ambulatory Visit (HOSPITAL_COMMUNITY): Payer: Self-pay

## 2017-01-28 ENCOUNTER — Telehealth (HOSPITAL_COMMUNITY): Payer: Self-pay | Admitting: *Deleted

## 2017-01-28 ENCOUNTER — Encounter (HOSPITAL_COMMUNITY): Payer: BLUE CROSS/BLUE SHIELD

## 2017-01-28 NOTE — Progress Notes (Signed)
Paramedicine Encounter    Patient ID: Jonathan [name], male    DOB: 14-Dec-1985, 31 y.o.   MRN: [id number]   Patient Care Team: Valinda Party, DO as PCP - General (Internal Medicine)  Patient Active Problem List   Diagnosis Date Noted  . OSA (obstructive sleep apnea) 12/02/2016  . Chronic systolic (congestive) heart failure ([place])   . Obesity, Class III, BMI 40-49.9 (morbid obesity) ([place]) 10/26/2016  . Asthma, chronic, mild persistent, uncomplicated 55/73/2202  . Essential hypertension 10/26/2016  . Diabetes mellitus type 2 in obese ([place]) 04/20/2016    Current Outpatient Medications:  .  carvedilol (COREG) 12.5 MG tablet, Take 1 tablet (12.5 mg total) by mouth 2 (two) times daily with a meal., Disp: 60 tablet, Rfl: 6 .  glipiZIDE (GLUCOTROL XL) 2.5 MG 24 hr tablet, Take 1 tablet (2.5 mg total) daily with breakfast by mouth., Disp: 30 tablet, Rfl: 2 .  ibuprofen (ADVIL,MOTRIN) 800 MG tablet, Take 800 mg by mouth every 8 (eight) hours., Disp: , Rfl:  .  sacubitril-valsartan (ENTRESTO) 97-103 MG, Take 1 tablet 2 (two) times daily by mouth., Disp: 60 tablet, Rfl: 6 .  sitaGLIPtin (JANUVIA) 100 MG tablet, Take 1 tablet (100 mg total) daily by mouth., Disp: 30 tablet, Rfl: 2 .  spironolactone (ALDACTONE) 25 MG tablet, Take 1 tablet (25 mg total) by mouth daily., Disp: 30 tablet, Rfl: 6 .  torsemide (DEMADEX) 20 MG tablet, Take 1 tablet (20 mg total) by mouth 2 (two) times daily., Disp: 60 tablet, Rfl: 3 .  albuterol (PROVENTIL HFA;VENTOLIN HFA) 108 (90 BASE) MCG/ACT inhaler, Inhale 2 puffs into the lungs every 4 (four) hours as needed for wheezing. Dispense with aerochamber (Patient not taking: Reported on 01/10/2017), Disp: 1 Inhaler, Rfl: 0 .  Amino Acids (L-CARNITINE PO), Take 1 tablet daily by mouth., Disp: , Rfl:  .  blood glucose meter kit and supplies KIT, Dispense based on patient and insurance preference. Use up to four times daily as directed. (FOR ICD-9 250.00, 250.01). For QAC  - HS accuchecks. May switch to any brand. (Patient not taking: Reported on 01/28/2017), Disp: 1 each, Rfl: 1 .  fluticasone furoate-vilanterol (BREO ELLIPTA) 200-25 MCG/INH AEPB, Inhale 1 puff into the lungs daily., Disp: , Rfl:  .  metolazone (ZAROXOLYN) 2.5 MG tablet, Take 1 tablet (2.5 mg total) by mouth as needed. As directed by HF clinic for weight gain of 3 lbs overnight or 5 lbs within one week. (Patient not taking: Reported on 01/10/2017), Disp: 10 tablet, Rfl: 2 .  sitaGLIPtin-metformin (JANUMET) 50-1000 MG tablet, Take 1 tablet by mouth 2 (two) times daily with a meal., Disp: , Rfl:  .  tiotropium (SPIRIVA) 18 MCG inhalation capsule, Place 18 mcg daily into inhaler and inhale., Disp: , Rfl:  Allergies  Allergen Reactions  . Hydrocodone     Hives       Social History   Socioeconomic History  . Marital status: Married    Spouse name: Not on file  . Number of children: Not on file  . Years of education: Not on file  . Highest education level: Not on file  Social Needs  . Financial resource strain: Not on file  . Food insecurity - worry: Not on file  . Food insecurity - inability: Not on file  . Transportation needs - medical: Not on file  . Transportation needs - non-medical: Not on file  Occupational History  . Not on file  Tobacco Use  . Smoking status: Never  Smoker  . Smokeless tobacco: Never Used  Substance and Sexual Activity  . Alcohol use: No  . Drug use: No  . Sexual activity: No  Other Topics Concern  . Not on file  Social History Narrative  . Not on file    Physical Exam  Constitutional: He is oriented to person, place, and time.  Cardiovascular: Normal rate and regular rhythm.  Pulmonary/Chest: Effort normal and breath sounds normal. No respiratory distress. He has no wheezes. He has no rales.  Abdominal: Soft. Bowel sounds are normal.  Musculoskeletal: Normal range of motion. He exhibits edema.  Neurological: He is alert and oriented to person,  place, and time.  Skin: Skin is warm and dry.  Psychiatric: He has a normal mood and affect.        Future Appointments  Date Time Provider [place]  01/28/2017  2:45 PM MC-PHASE2 MONITOR 1 MC-REHSC None  01/31/2017  2:45 PM MC-PHASE2 MONITOR 1 MC-REHSC None  02/01/2017 11:00 AM MC-HVSC PHARMACY MC-HVSC None  02/02/2017  2:45 PM MC-PHASE2 MONITOR 1 MC-REHSC None  02/04/2017  2:45 PM MC-PHASE2 MONITOR 1 MC-REHSC None  02/07/2017  2:45 PM MC-PHASE2 MONITOR 1 MC-REHSC None  02/09/2017  2:45 PM MC-PHASE2 MONITOR 1 MC-REHSC None  02/11/2017  2:45 PM MC-PHASE2 MONITOR 1 MC-REHSC None  02/14/2017  2:45 PM MC-PHASE2 MONITOR 1 MC-REHSC None  02/16/2017  2:45 PM MC-PHASE2 MONITOR 1 MC-REHSC None  02/18/2017  2:45 PM MC-PHASE2 MONITOR 1 MC-REHSC None  02/21/2017  2:45 PM MC-PHASE2 MONITOR 1 MC-REHSC None  02/23/2017  2:45 PM MC-PHASE2 MONITOR 1 MC-REHSC None  02/25/2017  2:45 PM MC-PHASE2 MONITOR 1 MC-REHSC None  02/28/2017  2:45 PM MC-PHASE2 MONITOR 1 MC-REHSC None  03/02/2017  9:00 AM MC ECHO 1-BUZZ MC-ECHOLAB [facility]  03/02/2017 10:00 AM Bensimhon, Shaune Pascal, MD MC-HVSC None  03/02/2017  2:45 PM MC-PHASE2 MONITOR 1 MC-REHSC None  03/03/2017 10:20 AM Sueanne Margarita, MD [facility]  03/04/2017  2:45 PM MC-PHASE2 MONITOR 1 MC-REHSC None  03/07/2017  2:45 PM MC-PHASE2 MONITOR 1 MC-REHSC None  03/09/2017  2:45 PM MC-PHASE2 MONITOR 1 MC-REHSC None  03/10/2017  1:15 PM Valinda Party, DO IMP-IMCR [facility]  03/11/2017  2:45 PM MC-PHASE2 MONITOR 1 MC-REHSC None  03/14/2017  2:45 PM MC-PHASE2 MONITOR 1 MC-REHSC None  03/16/2017  2:45 PM MC-PHASE2 MONITOR 1 MC-REHSC None  03/18/2017  2:45 PM MC-PHASE2 MONITOR 1 MC-REHSC None  03/21/2017  2:45 PM MC-PHASE2 MONITOR 1 MC-REHSC None  03/23/2017  2:45 PM MC-PHASE2 MONITOR 1 MC-REHSC None  03/25/2017  2:45 PM MC-PHASE2 MONITOR 1 MC-REHSC None    BP 100/70 (BP Location: Right Arm, Patient Position: Sitting, Cuff Size: Large)   Pulse 87   Resp 16    Wt (!) 339 lb 3.2 oz (153.9 kg)   SpO2 97%   BMI 45.37 kg/m   Weight yesterday- 341 lb Last visit weight- 337.8 lb CBG- 313 mg/dl  Jonathan [name] was seen at home today. He denied SOB and dizziness but reported having a headache for the past two days. He stated he has not taken anything for the headache but was playing video games in a dark room which I advised could be contributing to his headache. His weight remains stable and he reports being compliant with his medications. Medications were verified but he continues to fill his own pillbox.   Time spent with patient: 31 minutes  [name], EMT 01/28/17  ACTION: Home visit completed Next visit planned for 1  week

## 2017-01-31 ENCOUNTER — Encounter (HOSPITAL_COMMUNITY)
Admission: RE | Admit: 2017-01-31 | Discharge: 2017-01-31 | Disposition: A | Discharge status: Home / Self Care | Payer: BLUE CROSS/BLUE SHIELD | Source: Ambulatory Visit | Attending: Internal Medicine | Admitting: Internal Medicine

## 2017-01-31 DIAGNOSIS — I5022 Chronic systolic (congestive) heart failure: Secondary | ICD-10-CM | POA: Diagnosis not present

## 2017-01-31 LAB — GLUCOSE, CAPILLARY
GLUCOSE-CAPILLARY: 138 mg/dL — AB (ref 65–99)
GLUCOSE-CAPILLARY: 179 mg/dL — AB (ref 65–99)

## 2017-01-31 NOTE — Progress Notes (Signed)
[name] says he is stopping participation in cardiac rehab at the end of this week due to insurance benefits.[name], RN,BSN 01/31/2017 4:38 PM

## 2017-02-01 ENCOUNTER — Ambulatory Visit (HOSPITAL_COMMUNITY)
Admission: RE | Admit: 2017-02-01 | Discharge: 2017-02-01 | Disposition: A | Discharge status: Home / Self Care | Payer: BLUE CROSS/BLUE SHIELD | Source: Ambulatory Visit | Attending: Internal Medicine | Admitting: Internal Medicine

## 2017-02-01 DIAGNOSIS — G4733 Obstructive sleep apnea (adult) (pediatric): Secondary | ICD-10-CM | POA: Diagnosis not present

## 2017-02-01 DIAGNOSIS — J45909 Unspecified asthma, uncomplicated: Secondary | ICD-10-CM | POA: Diagnosis not present

## 2017-02-01 DIAGNOSIS — Z6841 Body Mass Index (BMI) 40.0 and over, adult: Secondary | ICD-10-CM | POA: Diagnosis not present

## 2017-02-01 DIAGNOSIS — I110 Hypertensive heart disease with heart failure: Secondary | ICD-10-CM | POA: Diagnosis present

## 2017-02-01 DIAGNOSIS — E119 Type 2 diabetes mellitus without complications: Secondary | ICD-10-CM | POA: Insufficient documentation

## 2017-02-01 DIAGNOSIS — I5022 Chronic systolic (congestive) heart failure: Secondary | ICD-10-CM | POA: Insufficient documentation

## 2017-02-01 MED ORDER — CARVEDILOL 25 MG PO TABS: 25.0000 mg | ORAL_TABLET | Freq: Two times a day (BID) | ORAL | 11 refills | Status: DC

## 2017-02-01 NOTE — Progress Notes (Signed)
HF MD: Gala Romney  HPIKamran Robson McCloudis a 31 y.o.African American malewith history of systolic CHF due to NICM, HTN, DM 2, Asthma, OSA, and morbid obesity (Body mass index is 48.76 kg/m.)  Pt presented to [facility] 10/26/16 with progressive SOB x 2 weeks. He reports h/o systolic CHF with EF 15-20% by Echo in [place] x 1 year ago. Cath around that time without any significant CAD per patient. Recently went for a long period without any insurance, but recently re-obtained through his wifes work. He was diuresed with IV Lasix, discharge weight was 373 pounds. He had run out of most of his medications due to moving to [place] and not having insurance. Meds restarted at discharge: Coreg, Westley Foots, Lasix.   He presents today for pharmacist-led HF medication titration. At last HF clinic visit on 12/30/16, his Sherryll Burger was increased to 97-103 mg BID. Feeling OK. Gained some weight over last couple of weeks but admits to stress eating. Denies SOB with flat ground or ADLs. Sleeps on his sides with chronic orthopnea. Has not had sleep study. Has started cardiac rehab. He denies CP, lightheadedness or dizziness. Taking medications as directed.     . Shortness of breath/dyspnea on exertion? Yes - stable going up stairs . Orthopnea/PND? Yes - sleeps on side . Edema? Yes - trace stable . Lightheadedness/dizziness? No . Daily weights at home? Yes - stable ~335-340 lb . Blood pressure/heart rate monitoring at home? Yes - [name] measuring at visits . Following low-sodium/fluid-restricted diet? No - starting to eat more salty/fried foods, still <2L/day  HF Medications: Carvedilol 12.5 mg PO BID Metolazone 2.5 mg PO PRN weight gain/SOB/edema - took 1 last week (felt like he was building up fluid in legs) Entresto 97-103 mg PO BID Spironolactone 25 mg PO daily Torsemide 20 mg PO BID   Has the patient been experiencing any side effects to the medications prescribed?  no  Does the patient have any problems obtaining  medications due to transportation or finances?   No - [employer] commercial + Entresto $10 copay card  Understanding of regimen: good Understanding of indications: good Potential of compliance: good Patient understands to avoid NSAIDs. Patient understands to avoid decongestants.    Pertinent Lab Values: . 01/11/17: Serum creatinine 0.78, BUN 8, Potassium 3.9, Sodium 137  Vital Signs: . Weight: 349.8 lb (dry weight: 340 lb) . Blood pressure: 118/84 mmHg  . Heart rate: 88 bpm    Assessment: 1. Chronicsystolic CHF (EF 73-53%), due to NICM. NYHA class IIsymptoms.  - Volume status stable although weight up, likely 2/2 increased appetite/"stress eating"  - Increase carvedilol to goal 25 mg BID  - Continue metolazone 2.5 mg PRN weight gain/SOB/edema, Entresto 97-103 mg BID, spironolactone 25 mg daily, and torsemide 20 mg PO BID  - Consider addition of Bidil at next visit as able if BP stable  - Basic disease state pathophysiology, medication indication, mechanism and side effects reviewed at length with patient and he verbalized understanding 2. HTN  - BP at goal <130/80 mmHg  - Meds as above 3. DM2   - Hgb A1c is 10.8  - Now following with Dr. Delma Officer as PCP  - Diabetes medications adjusted 11/30/16 4. Morbid Obesity  - Body mass index is 46.98 kg/m  - Has started cardiac rehab 5. OSA/OHS  - Sleep study ordered. Has previous diagnosis of OSA, but no longer has CPAP.   - Has not scheduled sleep study yet. Encouraged to call.   Plan: 1) Medication changes:  Based on clinical presentation, vital signs and recent labs will increase carvedilol to 25 mg BID 2) Labs: PRN (at least q 3 months with spiro) 3) Follow-up: Dr. Gala RomneyBensimhon on 03/02/17   Tyler DeisErika K. Bonnye FavaNicolsen, PharmD, BCPS, CPP Clinical Pharmacist Pager: [phone] Phone: [phone] 02/01/2017 11:04 AM

## 2017-02-01 NOTE — Patient Instructions (Addendum)
It was great to see you today!  Please INCREASE your carvedilol to 25 mg TWICE DAILY. You may take #2 of your 12.5 mg tablets TWICE DAILY until you pick up the higher dose, then take 1 tablet TWICE DAILY.   Please keep your appointment with Dr. Gala Romney on 03/02/17.

## 2017-02-02 ENCOUNTER — Telehealth (HOSPITAL_COMMUNITY): Payer: Self-pay | Admitting: *Deleted

## 2017-02-02 ENCOUNTER — Encounter (HOSPITAL_COMMUNITY)
Admission: RE | Admit: 2017-02-02 | Discharge: 2017-02-02 | Disposition: A | Discharge status: Home / Self Care | Payer: BLUE CROSS/BLUE SHIELD | Source: Ambulatory Visit | Attending: Internal Medicine | Admitting: Internal Medicine

## 2017-02-02 DIAGNOSIS — I5022 Chronic systolic (congestive) heart failure: Secondary | ICD-10-CM | POA: Diagnosis not present

## 2017-02-02 LAB — GLUCOSE, CAPILLARY: Glucose-Capillary: 113 mg/dL — ABNORMAL HIGH (ref 65–99)

## 2017-02-02 MED ORDER — CARVEDILOL 25 MG PO TABS: 12.5000 mg | ORAL_TABLET | Freq: Two times a day (BID) | ORAL | 11 refills | Status: DC

## 2017-02-02 NOTE — Telephone Encounter (Signed)
Jonathan [name] with Cardiac Rehab called to report patients significant weight gain. Pt also very fatigued since increasing carvedilol. Per Mardelle Matte take PRN metolazone today and call tomorrow and report weight. Also decrease carvedilol to 12.5mg  twice daily and lab work to tomorrow at 9:45am pt aware and agreeable.

## 2017-02-02 NOTE — Progress Notes (Signed)
Incomplete Session Note  Patient Details  Name: Khup Merriweather MRN: [id number] Date of Birth: 1986-01-29 Referring Provider:     CARDIAC REHAB PHASE II ORIENTATION from 12/28/2016 in [facility] CARDIAC [facility]  Referring Provider  Arvilla Meres, MD.      [name] did not complete his rehab session.  Faisal's weight is up 3.5 kg from Monday. Thelmon says he feels very fatigued today. Mathew's coreg was increased yesterday. Blood pressure 98/60. Heart rate 80. Oxygen saturation 100% on room air. Upon assessment lung fields with coarse breath sound a few expiratory wheezes in the left posterior base. No peripheral edema noted. Jorman has not used any of his prn metolazone since last week. Jasmine at Dr Bensimhon's office was called and notified. Jasmine discussed with Otilio Saber PA. Jasmine talked with Mr Gwynne Edinger over the phone and instructed the patient to take an extra metolazone today. Dakotah's CBG was 113. Andras did not exercise today. Yacqub went to get some lunch as he only had chicken noodle soup and a pop tart today.Will continue to monitor the patient throughout  the program.Arien Morine Harlon Flor, RN,BSN 02/02/2017 4:36 PM

## 2017-02-03 ENCOUNTER — Other Ambulatory Visit (HOSPITAL_COMMUNITY): Payer: BLUE CROSS/BLUE SHIELD

## 2017-02-03 ENCOUNTER — Telehealth (HOSPITAL_COMMUNITY): Payer: Self-pay

## 2017-02-03 ENCOUNTER — Other Ambulatory Visit (HOSPITAL_COMMUNITY): Payer: Self-pay

## 2017-02-03 NOTE — Progress Notes (Signed)
Paramedicine Encounter    Patient ID: Jonathan [name], male    DOB: 1985-12-05, 31 y.o.   MRN: [id number]   Patient Care Team: Valinda Party, DO as PCP - General (Internal Medicine)  Patient Active Problem List   Diagnosis Date Noted  . OSA (obstructive sleep apnea) 12/02/2016  . Chronic systolic (congestive) heart failure ([place])   . Obesity, Class III, BMI 40-49.9 (morbid obesity) ([place]) 10/26/2016  . Asthma, chronic, mild persistent, uncomplicated 40/98/1191  . Essential hypertension 10/26/2016  . Diabetes mellitus type 2 in obese ([place]) 04/20/2016    Current Outpatient Medications:  .  carvedilol (COREG) 25 MG tablet, Take 0.5 tablets (12.5 mg total) by mouth 2 (two) times daily with a meal., Disp: 30 tablet, Rfl: 11 .  glipiZIDE (GLUCOTROL XL) 2.5 MG 24 hr tablet, Take 1 tablet (2.5 mg total) daily with breakfast by mouth., Disp: 30 tablet, Rfl: 2 .  ibuprofen (ADVIL,MOTRIN) 800 MG tablet, Take 800 mg by mouth every 8 (eight) hours., Disp: , Rfl:  .  metolazone (ZAROXOLYN) 2.5 MG tablet, Take 1 tablet (2.5 mg total) by mouth as needed. As directed by HF clinic for weight gain of 3 lbs overnight or 5 lbs within one week., Disp: 10 tablet, Rfl: 2 .  sacubitril-valsartan (ENTRESTO) 97-103 MG, Take 1 tablet 2 (two) times daily by mouth., Disp: 60 tablet, Rfl: 6 .  sitaGLIPtin (JANUVIA) 100 MG tablet, Take 1 tablet (100 mg total) daily by mouth., Disp: 30 tablet, Rfl: 2 .  spironolactone (ALDACTONE) 25 MG tablet, Take 1 tablet (25 mg total) by mouth daily., Disp: 30 tablet, Rfl: 6 .  torsemide (DEMADEX) 20 MG tablet, Take 1 tablet (20 mg total) by mouth 2 (two) times daily., Disp: 60 tablet, Rfl: 3 .  albuterol (PROVENTIL HFA;VENTOLIN HFA) 108 (90 BASE) MCG/ACT inhaler, Inhale 2 puffs into the lungs every 4 (four) hours as needed for wheezing. Dispense with aerochamber (Patient not taking: Reported on 02/02/2017), Disp: 1 Inhaler, Rfl: 0 .  Amino Acids (L-CARNITINE PO), Take 1 tablet  daily by mouth., Disp: , Rfl:  .  blood glucose meter kit and supplies KIT, Dispense based on patient and insurance preference. Use up to four times daily as directed. (FOR ICD-9 250.00, 250.01). For QAC - HS accuchecks. May switch to any brand. (Patient not taking: Reported on 02/03/2017), Disp: 1 each, Rfl: 1 .  tiotropium (SPIRIVA) 18 MCG inhalation capsule, Place 18 mcg daily into inhaler and inhale., Disp: , Rfl:  Allergies  Allergen Reactions  . Hydrocodone     Hives       Social History   Socioeconomic History  . Marital status: Married    Spouse name: Not on file  . Number of children: Not on file  . Years of education: Not on file  . Highest education level: Not on file  Social Needs  . Financial resource strain: Not on file  . Food insecurity - worry: Not on file  . Food insecurity - inability: Not on file  . Transportation needs - medical: Not on file  . Transportation needs - non-medical: Not on file  Occupational History  . Not on file  Tobacco Use  . Smoking status: Never Smoker  . Smokeless tobacco: Never Used  Substance and Sexual Activity  . Alcohol use: No  . Drug use: No  . Sexual activity: No  Other Topics Concern  . Not on file  Social History Narrative  . Not on file    Physical  Exam  Constitutional: He is oriented to person, place, and time.  Cardiovascular: Normal rate and regular rhythm.  Pulmonary/Chest: Effort normal and breath sounds normal. No respiratory distress. He has no wheezes. He has no rales.  Abdominal: Soft.  Musculoskeletal: Normal range of motion. He exhibits edema.  Neurological: He is alert and oriented to person, place, and time.  Skin: Skin is warm and dry.  Psychiatric: He has a normal mood and affect.        Future Appointments  Date Time Provider [place]  02/04/2017  2:45 PM MC-PHASE2 MONITOR 1 MC-RE[facility]SC None  02/09/2017  2:45 PM MC-PHASE2 MONITOR 1 [facility] None  02/10/2017 10:00 AM [facility] LAB  [facility] None  02/11/2017  2:45 PM MC-PHASE2 MONITOR 1 [facility] None  02/14/2017  2:45 PM MC-PHASE2 MONITOR 1 [facility] None  02/16/2017  2:45 PM MC-PHASE2 MONITOR 1 [facility] None  02/18/2017  2:45 PM MC-PHASE2 MONITOR 1 [facility]RE[facility] None  02/21/2017  2:45 PM MC-PHASE2 MONITOR 1 [facility] None  02/23/2017  2:45 PM MC-PHASE2 MONITOR 1 [facility] None  02/25/2017  2:45 PM MC-PHASE2 MONITOR 1 [facility] None  02/28/2017  2:45 PM MC-PHASE2 MONITOR 1 [facility] None  03/02/2017  9:00 AM MC ECHO 1-BUZZ [facility]-ECHOL[facility]  03/02/2017 10:00 AM Bensimhon, Shaune Pascal, MD [facility] None  03/02/2017  2:45 PM MC-PHASE2 MONITOR 1 [facility]RE[facility] None  03/03/2017 10:20 AM Sueanne Margarita, MD [facility]  03/04/2017  2:45 PM MC-PHASE2 MONITOR 1 MC-RE[facility] None  03/07/2017  2:45 PM MC-PHASE2 MONITOR 1 MC-RE[facility] None  03/09/2017  2:45 PM MC-PHASE2 MONITOR 1 [facility]-RE[facility] None  03/10/2017  1:15 PM Valinda Party, DO [facility]  03/11/2017  2:45 PM MC-PHASE2 MONITOR 1 MC-REHSC None  03/14/2017  2:45 PM MC-PHASE2 MONITOR 1 MC-REHSC None  03/16/2017  2:45 PM MC-PHASE2 MONITOR 1 MC-REHSC None  03/18/2017  2:45 PM MC-PHASE2 MONITOR 1 MC-REHSC None  03/21/2017  2:45 PM MC-PHASE2 MONITOR 1 MC-RE[facility]SC None  03/23/2017  2:45 PM MC-PHASE2 MONITOR 1 MC-RE[facility]SC None  03/25/2017  2:45 PM MC-PHASE2 MONITOR 1 MC-REHSC None    BP 100/60 (BP Location: Left Arm, Patient Position: Sitting, Cuff Size: Large)   Pulse 98   Resp 18   Wt (!) 350 lb 6.4 oz (158.9 kg)   SpO2 97%   BMI 46.87 kg/m   Weight yesterday-  352 lb Last visit weight- 339.2 lb  Jonathan [name] was seen today and reported feeling tired but not as bad as he felt yesterday. He stated he had felt unwell while at cardiac rehab and was hypotensive so they contacted the clinic who decreased his carvedilol and advised he take a metolazone for substantial weight gain. He stated he did not decrease his carvedilol today despite the direction from the clinic but did take a metolazone this morning. He said  that the metolazone is not making him urinate any more than normal and his weight was 350 lb today. I asked him why he thought his weight was up he said he 'has been eating a lot of junk food" and "sometimes he falls off." When asked why he did not decrease the carvedilol he said he just forgot to take it out of his pillbox. He allowed me to look at his box and he had only increased carvedilol to 25 mg in the morning but still had 12.5 mg in the evening. Furthermore he had not removed the extra 12.5 mg from the nest several morning slots. When I asked his to go ahead while I was  with him and remove the extra carvedilol he replied, "I will do it later." I contacted the clinic to let them know about his fluid retention and they advised to contact him tomorrow and see if his weight has come down.   Time spent with patient: 32 minutes  [name], EMT 02/03/17  ACTION: Home visit completed Next visit planned for 1 week

## 2017-02-03 NOTE — Telephone Encounter (Signed)
I called Mr Surette to schedule an appointment. He said he would not be able to meet today but to call him on Thursday and see if he was free after 12:00.

## 2017-02-04 ENCOUNTER — Encounter (HOSPITAL_COMMUNITY): Payer: BLUE CROSS/BLUE SHIELD

## 2017-02-07 ENCOUNTER — Encounter (HOSPITAL_COMMUNITY): Payer: BLUE CROSS/BLUE SHIELD

## 2017-02-09 ENCOUNTER — Encounter (HOSPITAL_COMMUNITY)
Admission: RE | Admit: 2017-02-09 | Discharge: 2017-02-09 | Disposition: A | Discharge status: Home / Self Care | Payer: BLUE CROSS/BLUE SHIELD | Source: Ambulatory Visit | Attending: Internal Medicine | Admitting: Internal Medicine

## 2017-02-09 VITALS — BP 110/80 | HR 96 | Ht 72.5 in | Wt 348.3 lb

## 2017-02-09 DIAGNOSIS — I5022 Chronic systolic (congestive) heart failure: Secondary | ICD-10-CM

## 2017-02-09 LAB — GLUCOSE, CAPILLARY: GLUCOSE-CAPILLARY: 121 mg/dL — AB (ref 65–99)

## 2017-02-10 ENCOUNTER — Ambulatory Visit (HOSPITAL_COMMUNITY)
Admission: RE | Admit: 2017-02-10 | Discharge: 2017-02-10 | Disposition: A | Discharge status: Home / Self Care | Payer: BLUE CROSS/BLUE SHIELD | Source: Ambulatory Visit | Attending: Internal Medicine | Admitting: Internal Medicine

## 2017-02-10 DIAGNOSIS — I5022 Chronic systolic (congestive) heart failure: Secondary | ICD-10-CM | POA: Diagnosis present

## 2017-02-10 LAB — BASIC METABOLIC PANEL
Anion gap: 13 (ref 5–15)
BUN: 18 mg/dL (ref 6–20)
CALCIUM: 9.5 mg/dL (ref 8.9–10.3)
CO2: 26 mmol/L (ref 22–32)
CREATININE: 0.89 mg/dL (ref 0.61–1.24)
Chloride: 99 mmol/L — ABNORMAL LOW (ref 101–111)
GFR calc non Af Amer: >60 mL/min (ref >60)
Glucose, Bld: 211 mg/dL — ABNORMAL HIGH (ref 65–99)
Potassium: 4.3 mmol/L (ref 3.5–5.1)
SODIUM: 138 mmol/L (ref 135–145)

## 2017-02-11 ENCOUNTER — Encounter (HOSPITAL_COMMUNITY): Payer: BLUE CROSS/BLUE SHIELD

## 2017-02-11 ENCOUNTER — Encounter (HOSPITAL_COMMUNITY): Payer: Self-pay | Admitting: *Deleted

## 2017-02-11 DIAGNOSIS — I5022 Chronic systolic (congestive) heart failure: Secondary | ICD-10-CM

## 2017-02-11 NOTE — Progress Notes (Signed)
Discharge Progress Report  Patient Details  Name: Jonathan [name] MRN: [id number] Date of Birth: 09/12/1985 Referring Provider:     CARDIAC [facility]AB [facility]  Referring Provider  Arvilla MeresBensimhon, Daniel, MD.       Number of Visits: 7  Reason for Discharge:  Early Exit:  Insurance  Smoking History:  Social History   Tobacco Use  Smoking Status Never Smoker  Smokeless Tobacco Never Used    Diagnosis:  No diagnosis found.  ADL UCSD:   Initial Exercise Prescription: Initial Exercise Prescription - 12/28/16 1500      Date of Initial Exercise RX and Referring Provider   Date  12/28/16    Referring Provider  Arvilla MeresBensimhon, Daniel, MD.      NuStep   Level  4    SPM  85    Minutes  10    METs  2.5      Arm Ergometer   Level  3    Minutes  10    METs  2.5      Track   Laps  9    Minutes  10    METs  2.57      Prescription Details   Frequency (times per week)  3    Duration  Progress to 30 minutes of continuous aerobic without signs/symptoms of physical distress      Intensity   THRR 40-80% of Max Heartrate  76-151    Ratings of Perceived Exertion  11-13    Perceived Dyspnea  0-4      Progression   Progression  Continue to progress workloads to maintain intensity without signs/symptoms of physical distress.      Resistance Training   Training Prescription  Yes    Weight  3lbs    Reps  10-15       Discharge Exercise Prescription (Final Exercise Prescription Changes): Exercise Prescription Changes - 02/09/17 1600      Response to Exercise   Blood Pressure (Admit)  110/80    Blood Pressure (Exercise)  124/68    Blood Pressure (Exit)  118/80    Heart Rate (Admit)  96 bpm    Heart Rate (Exercise)  133 bpm    Heart Rate (Exit)  96 bpm    Rating of Perceived Exertion (Exercise)  13    Symptoms  none    Duration  Progress to 30 minutes of  aerobic without signs/symptoms of physical distress    Intensity  THRR unchanged      Progression   Progression  Continue to progress workloads to maintain intensity without signs/symptoms of physical distress.    Average METs  2.3      Resistance Training   Training Prescription  Yes    Weight  3lbs    Reps  10-15    Time  10 Minutes      Interval Training   Interval Training  No      NuStep   Level  4    SPM  85    Minutes  10    METs  2.3      Arm Ergometer   Level  3    Minutes  10    METs  1.85      Track   Laps  5.5    Minutes  10    METs  2.69      Home Exercise Plan   Plans to continue exercise at  Home (comment)    Frequency  Add 4 additional days to program exercise sessions.    Initial Home Exercises Provided  01/19/17       Functional Capacity: 6 Minute Walk    Row Name 12/28/16 1539 02/09/17 1540       6 Minute Walk   Phase  Initial  Discharge    Distance  1277 feet  1164 feet    Distance % Change  -  8.85 %    Walk Time  6 minutes  6 minutes    # of Rest Breaks  0  0    MPH  2.42  2.2    METS  4.14  3.62    RPE  11  13    VO2 Peak  14.49  12.67    Symptoms  Yes (comment)  No    Comments  Tingling in feet at end of walk test.  -    Resting HR  92 bpm  96 bpm    Resting BP  124/80  110/80    Resting Oxygen Saturation   98 %  -    Exercise Oxygen Saturation  during 6 min walk  97 %  -    Max Ex. HR  131 bpm  133 bpm    Max Ex. BP  136/84  102/64    2 Minute Post BP  129/85  118/80       Psychological, QOL, Others - Outcomes: PHQ 2/9: Depression screen [facility] 2/9 01/10/2017 12/30/2016 11/30/2016  Decreased Interest 0 0 3  Down, Depressed, Hopeless 1 1 3   PHQ - 2 Score 1 1 6   Altered sleeping - - 3  Tired, decreased energy - - 3  Change in appetite - - 2  Feeling bad or failure about yourself  - - 2  Trouble concentrating - - 3  Moving slowly or fidgety/restless - - 3  Suicidal thoughts - - 0  PHQ-9 Score - - 22  Difficult doing work/chores - - Extremely dIfficult    Quality of  Life: Quality of Life - 12/28/16 1624      Quality of Life Scores   Health/Function Pre  13.2 %    Socioeconomic Pre  16.29 %    Psych/Spiritual Pre  19.71 %    Family Pre  21 %    GLOBAL Pre  16.18 %       Personal Goals: Goals established at orientation with interventions provided to work toward goal. Personal Goals and Risk Factors at Admission - 12/28/16 1439      Core Components/Risk Factors/Patient Goals on Admission    Weight Management  Yes;Obesity;Weight Maintenance;Weight Loss    Intervention  Weight Management: Provide education and appropriate resources to help participant work on and attain dietary goals.;Weight Management: Develop a combined nutrition and exercise program designed to reach desired caloric intake, while maintaining appropriate intake of nutrient and fiber, sodium and fats, and appropriate energy expenditure required for the weight goal.;Weight Management/Obesity: Establish reasonable short term and long term weight goals.;Obesity: Provide education and appropriate resources to help participant work on and attain dietary goals.    Expected Outcomes  Short Term: Continue to assess and modify interventions until short term weight is achieved;Weight Maintenance: Understanding of the daily nutrition guidelines, which includes 25-35% calories from fat, 7% or less cal from saturated fats, less than 200mg  cholesterol, less than 1.5gm of sodium, & 5 or more servings of fruits and vegetables daily;Long Term: Adherence to nutrition and  physical activity/exercise program aimed toward attainment of established weight goal;Weight Loss: Understanding of general recommendations for a balanced deficit meal plan, which promotes 1-2 lb weight loss per week and includes a negative energy balance of [phone] kcal/d;Understanding recommendations for meals to include 15-35% energy as protein, 25-35% energy from fat, 35-60% energy from carbohydrates, less than 200mg  of dietary cholesterol,  20-35 gm of total fiber daily;Understanding of distribution of calorie intake throughout the day with the consumption of 4-5 meals/snacks    Diabetes  Yes    Intervention  Provide education about signs/symptoms and action to take for hypo/hyperglycemia.;Provide education about proper nutrition, including hydration, and aerobic/resistive exercise prescription along with prescribed medications to achieve blood glucose in normal ranges: Fasting glucose 65-99 mg/dL    Expected Outcomes  Short Term: Participant verbalizes understanding of the signs/symptoms and immediate care of hyper/hypoglycemia, proper foot care and importance of medication, aerobic/resistive exercise and nutrition plan for blood glucose control.;Long Term: Attainment of HbA1C < 7%.    Heart Failure  Yes    Intervention  Provide a combined exercise and nutrition program that is supplemented with education, support and counseling about heart failure. Directed toward relieving symptoms such as shortness of breath, decreased exercise tolerance, and extremity edema.    Expected Outcomes  Improve functional capacity of life;Short term: Attendance in program 2-3 days a week with increased exercise capacity. Reported lower sodium intake. Reported increased fruit and vegetable intake. Reports medication compliance.;Short term: Daily weights obtained and reported for increase. Utilizing diuretic protocols set by physician.;Long term: Adoption of self-care skills and reduction of barriers for early signs and symptoms recognition and intervention leading to self-care maintenance.    Hypertension  Yes    Intervention  Provide education on lifestyle modifcations including regular physical activity/exercise, weight management, moderate sodium restriction and increased consumption of fresh fruit, vegetables, and low fat dairy, alcohol moderation, and smoking cessation.;Monitor prescription use compliance.    Expected Outcomes  Short Term: Continued  assessment and intervention until BP is < 140/33mm HG in hypertensive participants. < 130/57mm HG in hypertensive participants with diabetes, heart failure or chronic kidney disease.;Long Term: Maintenance of blood pressure at goal levels.    Stress  Yes    Intervention  Offer individual and/or small group education and counseling on adjustment to heart disease, stress management and health-related lifestyle change. Teach and support self-help strategies.;Refer participants experiencing significant psychosocial distress to appropriate mental health specialists for further evaluation and treatment. When possible, include family members and significant others in education/counseling sessions.    Expected Outcomes  Short Term: Participant demonstrates changes in health-related behavior, relaxation and other stress management skills, ability to obtain effective social support, and compliance with psychotropic medications if prescribed.;Long Term: Emotional wellbeing is indicated by absence of clinically significant psychosocial distress or social isolation.        Personal Goals Discharge:   Exercise Goals and Review: Exercise Goals    Row Name 12/28/16 1536             Exercise Goals   Increase Physical Activity  Yes       Intervention  Provide advice, education, support and counseling about physical activity/exercise needs.;Develop an individualized exercise prescription for aerobic and resistive training based on initial evaluation findings, risk stratification, comorbidities and participant's personal goals.       Expected Outcomes  Achievement of increased cardiorespiratory fitness and enhanced flexibility, muscular endurance and strength shown through measurements of functional capacity and personal statement of participant.  Increase Strength and Stamina  Yes       Intervention  Provide advice, education, support and counseling about physical activity/exercise needs.;Develop an  individualized exercise prescription for aerobic and resistive training based on initial evaluation findings, risk stratification, comorbidities and participant's personal goals.       Expected Outcomes  Achievement of increased cardiorespiratory fitness and enhanced flexibility, muscular endurance and strength shown through measurements of functional capacity and personal statement of participant.       Able to understand and use rate of perceived exertion (RPE) scale  Yes       Intervention  Provide education and explanation on how to use RPE scale       Expected Outcomes  Short Term: Able to use RPE daily in rehab to express subjective intensity level;Long Term:  Able to use RPE to guide intensity level when exercising independently       Knowledge and understanding of Target Heart Rate Range (THRR)  Yes       Intervention  Provide education and explanation of THRR including how the numbers were predicted and where they are located for reference       Expected Outcomes  Short Term: Able to state/look up THRR;Long Term: Able to use THRR to govern intensity when exercising independently;Short Term: Able to use daily as guideline for intensity in rehab       Able to check pulse independently  Yes       Intervention  Provide education and demonstration on how to check pulse in carotid and radial arteries.;Review the importance of being able to check your own pulse for safety during independent exercise       Expected Outcomes  Short Term: Able to explain why pulse checking is important during independent exercise;Long Term: Able to check pulse independently and accurately       Understanding of Exercise Prescription  Yes       Intervention  Provide education, explanation, and written materials on patient's individual exercise prescription       Expected Outcomes  Short Term: Able to explain program exercise prescription;Long Term: Able to explain home exercise prescription to exercise independently           Nutrition & Weight - Outcomes: Pre Biometrics - 12/28/16 1644      Pre Biometrics   Height  6' 0.5" (1.842 m)    Weight  347 lb 7.1 oz (157.6 kg)  (Abnormal)     Waist Circumference  51.75 inches    Hip Circumference  58 inches    Waist to Hip Ratio  0.89 %    BMI (Calculated)  46.45    Triceps Skinfold  40 mm    % Body Fat  42.3 %    Grip Strength  32.5 kg    Single Leg Stand  1.25 seconds        Nutrition: Nutrition Therapy & Goals - 01/12/17 1628      Nutrition Therapy   Diet  Carb Modified, Heart Healthy      Personal Nutrition Goals   Nutrition Goal  Pt to identify and limit food sources of saturated fat, trans fat, and sodium    Personal Goal #2  Pt to identify food quantities necessary to achieve weight loss of 6-24 lb (2.7-10.9 kg) at graduation from cardiac rehab.     Personal Goal #3  Improved CBG concentrations as evidenced by pt's A1c trending from 13.5 to an ultimate goal of less than 7  Intervention Plan   Intervention  Prescribe, educate and counsel regarding individualized specific dietary modifications aiming towards targeted core components such as weight, hypertension, lipid management, diabetes, heart failure and other comorbidities.    Expected Outcomes  Short Term Goal: Understand basic principles of dietary content, such as calories, fat, sodium, cholesterol and nutrients.;Long Term Goal: Adherence to prescribed nutrition plan.       Nutrition Discharge: Nutrition Assessments - 01/12/17 1629      MEDFICTS Scores   Pre Score  47       Education Questionnaire Score: Knowledge Questionnaire Score - 12/28/16 1627      Knowledge Questionnaire Score   Pre Score  18/24       Kaeden attended 7 exercise sessions between 12/28/16-02/09/17. Michaelanthony's attendance was fair. Tiffany enjoyed participating in phase 2 cardiac rehab but had to stop at the end of the year due to finances.Woodfin had low scores on his quality of life questionnaire and may  benefit from counseling. Will forward Sharone's quality of life questionnaire to Dr Bensimhon's office for review.[name], RN,BSN 03/08/2017 12:09 PM

## 2017-02-14 ENCOUNTER — Encounter (HOSPITAL_COMMUNITY): Payer: BLUE CROSS/BLUE SHIELD

## 2017-02-16 ENCOUNTER — Encounter (HOSPITAL_COMMUNITY)
Admission: RE | Admit: 2017-02-16 | Discharge: 2017-02-16 | Disposition: A | Discharge status: Home / Self Care | Payer: BLUE CROSS/BLUE SHIELD | Source: Ambulatory Visit | Attending: Internal Medicine | Admitting: Internal Medicine

## 2017-02-16 DIAGNOSIS — I5022 Chronic systolic (congestive) heart failure: Secondary | ICD-10-CM | POA: Insufficient documentation

## 2017-02-16 NOTE — Progress Notes (Signed)
Incomplete Session Note  Patient Details  Name: Jonathan [name] MRN: [id number] Date of Birth: 05-Feb-1986 Referring Provider:     CARDIAC REHAB PHASE II ORIENTATION from 12/28/2016 in [facility]  Referring Provider  Arvilla Meres, MD.      [name] did not complete his rehab session.  [name] came and did not exercise due to his insurance coinsurance. [name] says he may try to complete his sessions in the future.[name], RN,BSN 02/16/2017 3:15 PM

## 2017-02-17 ENCOUNTER — Other Ambulatory Visit (HOSPITAL_COMMUNITY): Payer: Self-pay

## 2017-02-17 NOTE — Progress Notes (Signed)
Paramedicine Encounter    Patient ID: Jonathan [name], male    DOB: 06/15/1985, 32 y.o.   MRN: [id number]   Patient Care Team: Valinda Party, DO as PCP - General (Internal Medicine)  Patient Active Problem List   Diagnosis Date Noted  . OSA (obstructive sleep apnea) 12/02/2016  . Chronic systolic (congestive) heart failure ([place])   . Obesity, Class III, BMI 40-49.9 (morbid obesity) ([place]) 10/26/2016  . Asthma, chronic, mild persistent, uncomplicated 37/11/6267  . Essential hypertension 10/26/2016  . Diabetes mellitus type 2 in obese ([place]) 04/20/2016    Current Outpatient Medications:  .  carvedilol (COREG) 25 MG tablet, Take 0.5 tablets (12.5 mg total) by mouth 2 (two) times daily with a meal., Disp: 30 tablet, Rfl: 11 .  glipiZIDE (GLUCOTROL XL) 2.5 MG 24 hr tablet, Take 1 tablet (2.5 mg total) daily with breakfast by mouth., Disp: 30 tablet, Rfl: 2 .  metolazone (ZAROXOLYN) 2.5 MG tablet, Take 1 tablet (2.5 mg total) by mouth as needed. As directed by HF clinic for weight gain of 3 lbs overnight or 5 lbs within one week., Disp: 10 tablet, Rfl: 2 .  sacubitril-valsartan (ENTRESTO) 97-103 MG, Take 1 tablet 2 (two) times daily by mouth., Disp: 60 tablet, Rfl: 6 .  sitaGLIPtin (JANUVIA) 100 MG tablet, Take 1 tablet (100 mg total) daily by mouth., Disp: 30 tablet, Rfl: 2 .  spironolactone (ALDACTONE) 25 MG tablet, Take 1 tablet (25 mg total) by mouth daily., Disp: 30 tablet, Rfl: 6 .  torsemide (DEMADEX) 20 MG tablet, Take 1 tablet (20 mg total) by mouth 2 (two) times daily., Disp: 60 tablet, Rfl: 3 .  albuterol (PROVENTIL HFA;VENTOLIN HFA) 108 (90 BASE) MCG/ACT inhaler, Inhale 2 puffs into the lungs every 4 (four) hours as needed for wheezing. Dispense with aerochamber (Patient not taking: Reported on 02/02/2017), Disp: 1 Inhaler, Rfl: 0 .  Amino Acids (L-CARNITINE PO), Take 1 tablet daily by mouth., Disp: , Rfl:  .  blood glucose meter kit and supplies KIT, Dispense based on patient  and insurance preference. Use up to four times daily as directed. (FOR ICD-9 250.00, 250.01). For QAC - HS accuchecks. May switch to any brand. (Patient not taking: Reported on 02/17/2017), Disp: 1 each, Rfl: 1 .  ibuprofen (ADVIL,MOTRIN) 800 MG tablet, Take 800 mg by mouth every 8 (eight) hours., Disp: , Rfl:  .  tiotropium (SPIRIVA) 18 MCG inhalation capsule, Place 18 mcg daily into inhaler and inhale., Disp: , Rfl:  Allergies  Allergen Reactions  . Hydrocodone     Hives       Social History   Socioeconomic History  . Marital status: Married    Spouse name: Not on file  . Number of children: Not on file  . Years of education: Not on file  . Highest education level: Not on file  Social Needs  . Financial resource strain: Not on file  . Food insecurity - worry: Not on file  . Food insecurity - inability: Not on file  . Transportation needs - medical: Not on file  . Transportation needs - non-medical: Not on file  Occupational History  . Not on file  Tobacco Use  . Smoking status: Never Smoker  . Smokeless tobacco: Never Used  Substance and Sexual Activity  . Alcohol use: No  . Drug use: No  . Sexual activity: No  Other Topics Concern  . Not on file  Social History Narrative  . Not on file    Physical  Exam  Constitutional: He is oriented to person, place, and time.  Cardiovascular: Normal rate and regular rhythm.  Pulmonary/Chest: Effort normal and breath sounds normal. No respiratory distress. He has no wheezes. He has no rales.  Abdominal: Soft.  Musculoskeletal: Normal range of motion. He exhibits no edema.  Neurological: He is alert and oriented to person, place, and time.  Skin: Skin is warm and dry.  Psychiatric: He has a normal mood and affect.        Future Appointments  Date Time Provider [place]  02/18/2017  2:45 PM MC-PHASE2 MONITOR 1 MC-REHSC None  02/21/2017  2:45 PM MC-PHASE2 MONITOR 1 MC-REHSC None  02/23/2017  2:45 PM MC-PHASE2 MONITOR 1  MC-REHSC None  02/25/2017  2:45 PM MC-PHASE2 MONITOR 1 MC-REHSC None  02/28/2017  2:45 PM MC-PHASE2 MONITOR 1 MC-REHSC None  03/02/2017  9:00 AM MC ECHO 1-BUZZ MC-ECHOLAB [facility]  03/02/2017 10:00 AM Bensimhon, Shaune Pascal, MD MC-HVSC None  03/02/2017  2:45 PM MC-PHASE2 MONITOR 1 MC-REHSC None  03/03/2017 10:20 AM Sueanne Margarita, MD [facility]  03/04/2017  2:45 PM MC-PHASE2 MONITOR 1 MC-REHSC None  03/07/2017  2:45 PM MC-PHASE2 MONITOR 1 MC-REHSC None  03/09/2017  2:45 PM MC-PHASE2 MONITOR 1 MC-REHSC None  03/10/2017  1:15 PM Valinda Party, DO IMP-IMCR [facility]  03/11/2017  2:45 PM MC-PHASE2 MONITOR 1 MC-REHSC None  03/14/2017  2:45 PM MC-PHASE2 MONITOR 1 MC-REHSC None  03/16/2017  2:45 PM MC-PHASE2 MONITOR 1 MC-REHSC None  03/18/2017  2:45 PM MC-PHASE2 MONITOR 1 MC-REHSC None  03/21/2017  2:45 PM MC-PHASE2 MONITOR 1 MC-REHSC None  03/23/2017  2:45 PM MC-PHASE2 MONITOR 1 MC-REHSC None  03/25/2017  2:45 PM MC-PHASE2 MONITOR 1 MC-REHSC None    BP (!) 80/50 (BP Location: Right Arm, Patient Position: Sitting, Cuff Size: Large)   Pulse (!) 108   Resp 16   Wt (!) 349 lb 11.2 oz (158.6 kg)   SpO2 97%   BMI 46.78 kg/m   Weight yesterday- 348.2 lb Last visit weight- 350.9 lb  Jonathan [name] was seen at home today and reported feeling well. He took his medications at 07:30 today and was hypotensive. He denied headache, dizziness, SOB or orthopnea. I spoke with Amy at the clinic who advised to follow up next week and make sure he is still feeling well. I checked his pillbox and he did not have spironolactone in his box. He said he did but when I showed it to him he said he had to go find the bottle. He brought the bottle in and placed in his pillbox while I sat with him.   Time spent with patient: 28 minutes  [name], EMT 02/17/17  ACTION: Home visit completed Next visit planned for 1 week

## 2017-02-18 ENCOUNTER — Encounter (HOSPITAL_COMMUNITY): Payer: BLUE CROSS/BLUE SHIELD

## 2017-02-21 ENCOUNTER — Encounter (HOSPITAL_COMMUNITY): Payer: BLUE CROSS/BLUE SHIELD

## 2017-02-23 ENCOUNTER — Encounter (HOSPITAL_COMMUNITY): Payer: BLUE CROSS/BLUE SHIELD

## 2017-02-25 ENCOUNTER — Telehealth (HOSPITAL_COMMUNITY): Payer: Self-pay

## 2017-02-25 ENCOUNTER — Encounter (HOSPITAL_COMMUNITY): Payer: BLUE CROSS/BLUE SHIELD

## 2017-02-25 NOTE — Telephone Encounter (Signed)
I called Jonathan [name] to schedule an appointment. He stated he was not home and would not be home until late tonight. He said he was doing well and would see me next week.

## 2017-02-28 ENCOUNTER — Encounter (HOSPITAL_COMMUNITY): Payer: BLUE CROSS/BLUE SHIELD

## 2017-03-01 NOTE — Addendum Note (Signed)
Encounter addended by: Artist Pais on: 03/01/2017 12:23 PM  Actions taken: Flowsheet data copied forward, Visit Navigator Flowsheet section accepted, Vitals modified

## 2017-03-02 ENCOUNTER — Encounter (HOSPITAL_COMMUNITY): Payer: BLUE CROSS/BLUE SHIELD

## 2017-03-02 ENCOUNTER — Other Ambulatory Visit (HOSPITAL_COMMUNITY): Payer: Self-pay

## 2017-03-02 ENCOUNTER — Ambulatory Visit (HOSPITAL_BASED_OUTPATIENT_CLINIC_OR_DEPARTMENT_OTHER)
Admission: RE | Admit: 2017-03-02 | Discharge: 2017-03-02 | Disposition: A | Discharge status: Home / Self Care | Payer: BLUE CROSS/BLUE SHIELD | Source: Ambulatory Visit | Attending: Internal Medicine | Admitting: Internal Medicine

## 2017-03-02 ENCOUNTER — Encounter (HOSPITAL_COMMUNITY): Payer: Self-pay | Admitting: Internal Medicine

## 2017-03-02 ENCOUNTER — Ambulatory Visit (HOSPITAL_COMMUNITY)
Admission: RE | Admit: 2017-03-02 | Discharge: 2017-03-02 | Disposition: A | Discharge status: Home / Self Care | Payer: BLUE CROSS/BLUE SHIELD | Source: Ambulatory Visit | Attending: Internal Medicine | Admitting: Internal Medicine

## 2017-03-02 VITALS — BP 119/64 | HR 96 | Wt 354.1 lb

## 2017-03-02 DIAGNOSIS — Z7984 Long term (current) use of oral hypoglycemic drugs: Secondary | ICD-10-CM | POA: Insufficient documentation

## 2017-03-02 DIAGNOSIS — Z79899 Other long term (current) drug therapy: Secondary | ICD-10-CM | POA: Insufficient documentation

## 2017-03-02 DIAGNOSIS — I429 Cardiomyopathy, unspecified: Secondary | ICD-10-CM | POA: Insufficient documentation

## 2017-03-02 DIAGNOSIS — I110 Hypertensive heart disease with heart failure: Secondary | ICD-10-CM | POA: Insufficient documentation

## 2017-03-02 DIAGNOSIS — J45909 Unspecified asthma, uncomplicated: Secondary | ICD-10-CM | POA: Insufficient documentation

## 2017-03-02 DIAGNOSIS — G4733 Obstructive sleep apnea (adult) (pediatric): Secondary | ICD-10-CM | POA: Diagnosis not present

## 2017-03-02 DIAGNOSIS — I10 Essential (primary) hypertension: Secondary | ICD-10-CM | POA: Diagnosis not present

## 2017-03-02 DIAGNOSIS — I5022 Chronic systolic (congestive) heart failure: Secondary | ICD-10-CM

## 2017-03-02 DIAGNOSIS — Z6841 Body Mass Index (BMI) 40.0 and over, adult: Secondary | ICD-10-CM | POA: Diagnosis not present

## 2017-03-02 DIAGNOSIS — E1140 Type 2 diabetes mellitus with diabetic neuropathy, unspecified: Secondary | ICD-10-CM | POA: Diagnosis not present

## 2017-03-02 LAB — BASIC METABOLIC PANEL
ANION GAP: 13 (ref 5–15)
BUN: 20 mg/dL (ref 6–20)
CHLORIDE: 96 mmol/L — AB (ref 101–111)
CO2: 28 mmol/L (ref 22–32)
Calcium: 9.6 mg/dL (ref 8.9–10.3)
Creatinine, Ser: 0.98 mg/dL (ref 0.61–1.24)
Glucose, Bld: 183 mg/dL — ABNORMAL HIGH (ref 65–99)
Potassium: 3.9 mmol/L (ref 3.5–5.1)
SODIUM: 137 mmol/L (ref 135–145)

## 2017-03-02 NOTE — Progress Notes (Signed)
Paramedicine Encounter   Patient ID: Jonathan [name] , male,   DOB: 1985/10/25,31 y.o.,  MRN: [id number]   Jonathan [name] was seen at the HF clinic today. He reported feeling well. Echo showed return to normal function at ~55%. Dr Gala Romney has advised to continue all medications as currently prescribed with the exception of metolazone. He also advised that he could cut back on Torsemide as tolerated. Recommended to speak with his provider about changing to Jardiance if financially possible. Also recommended moderate exercise 30 minutes a day.   Time spent with patient: 30 minutes  Jacqualine Code, EMT 03/02/2017   ACTION: Next visit planned for 1 week

## 2017-03-02 NOTE — Progress Notes (Signed)
  Echocardiogram 2D Echocardiogram has been performed.  Leta Jungling M 03/02/2017, 10:08 AM

## 2017-03-02 NOTE — Progress Notes (Signed)
Advanced Heart Failure Clinic Note    Primary Care: No PCP  Primary Cardiologist: Dr. Haroldine Laws   HPI: Jonathan [name] is a 32 y.o. male with history of systolic CHF due to NICM, HTN, DM 2, Asthma, OSA, and morbid obesity (Body mass index is 48.76 kg/m.)  Pt presented to [facility] 10/26/16 with progressive SOB x 2 weeks. He reports h/o systolic CHF with EF 37-85% by Echo in [place] x 1 year ago. Cath around that time without any significant CAD per patient. Recently went for a long period without any insurance, but recently re-obtained through his wifes work. He was diuresed with IV Lasix, discharge weight was 373 pounds. He had run out of most of his medications due to moving to [place] and not having insurance. Meds restarted at discharge: Coreg, Mearl Latin, Lasix.   He presents today for follow up. Here with wife and Paramedicine. Says he is taking his meds religiously. Weight stable. Says fluid is doing better. No swelling. No orthopnea or PND. No CP. No dizziness. 2 weeks ago BP was in 80s. Has gotten better since then. Has not had sleep study.  Stopped going to [facility] due to insurance.   Echo today EF 55% Personally reviewed   Labs 11/30/16 K 4.7, Cr 0.89  Review of systems complete and found to be negative unless listed in HPI.   Past Medical History:  Diagnosis Date  . Asthma   . CHF (congestive heart failure) ([place])   . Hypertension   . Obesity   . Type 2 diabetes mellitus with diabetic neuropathy (HCC)     Current Outpatient Medications  Medication Sig Dispense Refill  . albuterol (PROVENTIL HFA;VENTOLIN HFA) 108 (90 BASE) MCG/ACT inhaler Inhale 2 puffs into the lungs every 4 (four) hours as needed for wheezing. Dispense with aerochamber 1 Inhaler 0  . Amino Acids (L-CARNITINE PO) Take 1 tablet daily by mouth.    . blood glucose meter kit and supplies KIT Dispense based on patient and insurance preference. Use up to four times daily as directed. (FOR ICD-9 250.00, 250.01). For QAC - HS  accuchecks. May switch to any brand. 1 each 1  . carvedilol (COREG) 25 MG tablet Take 0.5 tablets (12.5 mg total) by mouth 2 (two) times daily with a meal. 30 tablet 11  . glipiZIDE (GLUCOTROL XL) 2.5 MG 24 hr tablet Take 1 tablet (2.5 mg total) daily with breakfast by mouth. 30 tablet 2  . ibuprofen (ADVIL,MOTRIN) 800 MG tablet Take 800 mg by mouth every 8 (eight) hours.    . metolazone (ZAROXOLYN) 2.5 MG tablet Take 1 tablet (2.5 mg total) by mouth as needed. As directed by HF clinic for weight gain of 3 lbs overnight or 5 lbs within one week. 10 tablet 2  . sacubitril-valsartan (ENTRESTO) 97-103 MG Take 1 tablet 2 (two) times daily by mouth. 60 tablet 6  . sitaGLIPtin (JANUVIA) 100 MG tablet Take 1 tablet (100 mg total) daily by mouth. 30 tablet 2  . spironolactone (ALDACTONE) 25 MG tablet Take 1 tablet (25 mg total) by mouth daily. 30 tablet 6  . tiotropium (SPIRIVA) 18 MCG inhalation capsule Place 18 mcg daily into inhaler and inhale.    . torsemide (DEMADEX) 20 MG tablet Take 1 tablet (20 mg total) by mouth 2 (two) times daily. 60 tablet 3   No current facility-administered medications for this encounter.     Allergies  Allergen Reactions  . Hydrocodone     Hives  Social History   Socioeconomic History  . Marital status: Married    Spouse name: Not on file  . Number of children: Not on file  . Years of education: Not on file  . Highest education level: Not on file  Social Needs  . Financial resource strain: Not on file  . Food insecurity - worry: Not on file  . Food insecurity - inability: Not on file  . Transportation needs - medical: Not on file  . Transportation needs - non-medical: Not on file  Occupational History  . Not on file  Tobacco Use  . Smoking status: Never Smoker  . Smokeless tobacco: Never Used  Substance and Sexual Activity  . Alcohol use: No  . Drug use: No  . Sexual activity: No  Other Topics Concern  . Not on file  Social History Narrative    . Not on file   Family History  Problem Relation Age of Onset  . Diabetes Mellitus II Mother   . Heart failure Mother   . Hypertension Mother   . Heart disease Mother   . Heart failure Father   . Kidney disease Father   . Heart disease Father   . Congestive Heart Failure Neg Hx    Vitals:   03/02/17 1009  BP: 119/64  Pulse: 96  SpO2: 100%  Weight: (!) 354 lb 1.9 oz (160.6 kg)   Wt Readings from Last 3 Encounters:  03/02/17 (!) 354 lb 1.9 oz (160.6 kg)  02/17/17 (!) 349 lb 11.2 oz (158.6 kg)  02/09/17 (!) 348 lb 5.2 oz (158 kg)    PHYSICAL EXAM: General:  Well appearing. No resp difficulty HEENT: normal Neck: supple. no JVD. Carotids 2+ bilat; no bruits. No lymphadenopathy or thryomegaly appreciated. Cor: PMI nondisplaced. Regular rate & rhythm. No rubs, gallops or murmurs. Lungs: clear Abdomen: obese soft, nontender, nondistended. No hepatosplenomegaly. No bruits or masses. Good bowel sounds. Extremities: no cyanosis, clubbing, rash, edema Neuro: alert & orientedx3, cranial nerves grossly intact. moves all 4 extremities w/o difficulty. Affect pleasant   ECG: 11/09/16 personally reviewed: ST, LAFB  ASSESSMENT & PLAN:    1. Chronic systolic CHF due to NICM, normal cors by cath in [place] in 2017. Echo 10/2016 EF 30-35%.  - NYHA II symptoms - Echo today EF 55% with complete recovery. Stressed need to stay on medicines - Volume status stable on exam.  - Continue torsemide 20 mg BID - can cut back as needed but says he gets swollen if he stops   - Continue Entresto to 97/103 mg BID. BMET today - Continue coreg 12.5 mg BID.  - Continue spiro 25 mg daily.  - Orders have been placed to obtain medical records from [facility] center in [place], [place] - Reinforced fluid restriction to < 2 L daily, sodium restriction to less than 2000 mg daily, and the importance of daily weights.    2. HTN -Blood pressure well controlled. Continue current regimen.   3. DM2   - Following with Dr. Maricela Bo as PCP. Diabetes medications adjusted 11/30/16. Considering switching Glipizide to jardiance as budget tolerates  4. Morbid Obesity - Body mass index is 47.37 kg/m.  5. OSA/OHS - Sleep study ordered. Has previous diagnosis of OSA, but no longer has CPAP.  - Has not scheduled sleep study yet. Encouraged to call.    Glori Bickers, MD 03/02/17

## 2017-03-02 NOTE — Addendum Note (Signed)
Encounter addended by: Noralee Space, RN on: 03/02/2017 10:42 AM  Actions taken: Order list changed, Diagnosis association updated, Sign clinical note

## 2017-03-02 NOTE — Patient Instructions (Signed)
Labs today  Your physician recommends that you schedule a follow-up appointment in: 3 months  

## 2017-03-02 NOTE — Progress Notes (Deleted)
Cardiology Office Note:    Date:  03/02/2017   ID:  Jonathan [name], DOB 1985/04/10, MRN [id number]  PCP:  Patient, No Pcp Per  Cardiologist:  No primary care provider on file.    Referring MD: Geralyn Corwin Ratlif*   No chief complaint on file.   History of Present Illness:    Jonathan [name] is a 32 y.o. male with a hx of Chronic CHF, HTN and obesity who was referred by Dr. Gala Romney for evaluation of OSA.  He has morbid obesity with a BMI of 43.  He underwent PSG showing mild OSA with an AHI of 5.2/hr and moderate during REM sleep with an AHI of 16.7/hr with oxygen desaturations as low as 86%.  He is now here for evaluation and discussion of further treatment options.    Past Medical History:  Diagnosis Date  . Asthma   . CHF (congestive heart failure) (HCC)   . Hypertension   . Obesity   . Type 2 diabetes mellitus with diabetic neuropathy [place])     Past Surgical History:  Procedure Laterality Date  . CARDIAC CATHETERIZATION    . FOOT SURGERY     arch surgery    Current Medications: No outpatient medications have been marked as taking for the 03/03/17 encounter (Appointment) with Quintella Reichert, MD.     Allergies:   Hydrocodone   Social History   Socioeconomic History  . Marital status: Married    Spouse name: Not on file  . Number of children: Not on file  . Years of education: Not on file  . Highest education level: Not on file  Social Needs  . Financial resource strain: Not on file  . Food insecurity - worry: Not on file  . Food insecurity - inability: Not on file  . Transportation needs - medical: Not on file  . Transportation needs - non-medical: Not on file  Occupational History  . Not on file  Tobacco Use  . Smoking status: Never Smoker  . Smokeless tobacco: Never Used  Substance and Sexual Activity  . Alcohol use: No  . Drug use: No  . Sexual activity: No  Other Topics Concern  . Not on file  Social History Narrative  . Not on file      Family History: The patient's family history includes Diabetes Mellitus II in his mother; Heart disease in his father and mother; Heart failure in his father and mother; Hypertension in his mother; Kidney disease in his father. There is no history of Congestive Heart Failure.  ROS:   Please see the history of present illness.    ROS  All other systems reviewed and negative.   EKGs/Labs/Other Studies Reviewed:    The following studies were reviewed today: PSG  EKG:  EKG is not ordered today.   Recent Labs: 10/26/2016: ALT 42; B Natriuretic Peptide 627.5 10/27/2016: Hemoglobin 14.1; Platelets 273; TSH 1.009 11/04/2016: Magnesium 1.9 02/10/2017: BUN 18; Creatinine, Ser 0.89; Potassium 4.3; Sodium 138   Recent Lipid Panel    Component Value Date/Time   CHOL 135 10/27/2016 0221   TRIG 83 10/27/2016 0221   HDL 22 (L) 10/27/2016 0221   CHOLHDL 6.1 10/27/2016 0221   VLDL 17 10/27/2016 0221   LDLCALC 96 10/27/2016 0221    Physical Exam:    VS:  There were no vitals taken for this visit.    Wt Readings from Last 3 Encounters:  03/02/17 (!) 354 lb 1.9 oz (160.6 kg)  02/17/17 (!) 349  lb 11.2 oz (158.6 kg)  02/09/17 (!) 348 lb 5.2 oz (158 kg)     GEN:  Well nourished, well developed in no acute distress HEENT: Normal NECK: No JVD; No carotid bruits LYMPHATICS: No lymphadenopathy CARDIAC: RRR, no murmurs, rubs, gallops RESPIRATORY:  Clear to auscultation without rales, wheezing or rhonchi  ABDOMEN: Soft, non-tender, non-distended MUSCULOSKELETAL:  No edema; No deformity  SKIN: Warm and dry NEUROLOGIC:  Alert and oriented x 3 PSYCHIATRIC:  Normal affect   ASSESSMENT:    1. OSA (obstructive sleep apnea)   2. Essential hypertension   3. Obesity, Class III, BMI 40-49.9 (morbid obesity) (HCC)    PLAN:    In order of problems listed above:  1.  OSA  2.  HTN - BP is well controlled on exam today.  He will continue on Entresto 97-103mg  BID, Carvedilol 12.5mg  BID and  spironolactone 25mg  daily.    3.  Morbid obesity - I have encouraged him to get into a routine exercise program and cut back on carbs and portions.    Medication Adjustments/Labs and Tests Ordered: Current medicines are reviewed at length with the patient today.  Concerns regarding medicines are outlined above.  No orders of the defined types were placed in this encounter.  No orders of the defined types were placed in this encounter.   Signed, Armanda Magic, MD  03/02/2017 10:40 AM    [facility]

## 2017-03-03 ENCOUNTER — Ambulatory Visit: Payer: BLUE CROSS/BLUE SHIELD | Admitting: Cardiology

## 2017-03-04 ENCOUNTER — Encounter (HOSPITAL_COMMUNITY): Payer: BLUE CROSS/BLUE SHIELD

## 2017-03-07 ENCOUNTER — Encounter (HOSPITAL_COMMUNITY): Payer: BLUE CROSS/BLUE SHIELD

## 2017-03-09 ENCOUNTER — Encounter (HOSPITAL_COMMUNITY): Payer: BLUE CROSS/BLUE SHIELD

## 2017-03-10 ENCOUNTER — Encounter: Payer: BLUE CROSS/BLUE SHIELD | Admitting: Internal Medicine

## 2017-03-10 NOTE — Progress Notes (Deleted)
   CC: ***  HPI:  [name] is a 32 y.o.   Past Medical History:  Diagnosis Date  . Asthma   . CHF (congestive heart failure) (HCC)   . Hypertension   . Obesity   . Type 2 diabetes mellitus with diabetic neuropathy (HCC)     Review of Systems:  ***  Physical Exam:  There were no vitals filed for this visit. ***  Assessment & Plan:   See encounters tab for problem based medical decision making.  Assessment:  Chronic Systolic congestive heart failure Patient's last echo 10/2016 showed LVEF of 30-35%, diffuse hypokinesis and moderate concentric hypertrophy.  Patient follows with cardiology.   Patient currently takes sacubitril-valsartan 49-51mg , carvedilol 12.5mg  BID, spironolactone 25mg  qd, metolazone 2.5mg  prn and torsemide 20mg  BID.  He denies dyspnea on exertion or orthopnea  Plan -Continue current medications  Assessment:  Essential hypertension Today's blood pressure is ***.  Patient currently takes sacubitril-valsartan 49-51mg , carvedilol 12.5mg  BID, spironolactone 25mg  qd, metolazone 2.5mg  prn and torsemide 20mg  BID.  Plan -continue current medications  Assessment: Uncontrolled Type II diabetes Last Hemgolobin A1C was 13.5 and random glucose of 404 on 11/2016. On clinic visit on 12/2016 patient was started on glipizide and januvia.  Patient did not want to take metformin due to rash side effect.  It's hard to tell if metfromin caused this as he has taken it in combination pill form and not individually.   Insulin was again recommended to the patient however he does not want to start insulin due to having to inject the medication.  Today's random glucose is 222.    Plan -ophthalmology exam - *** glipizide and januvia - Follow up in 3 months    Patient {GC/GE:[id number]::"discussed with","seen with"} Dr. {NAMES:[id number]::"Butcher","Granfortuna","E. Amun Stemm","Klima","Mullen","Narendra","Vincent"}

## 2017-03-11 ENCOUNTER — Encounter (HOSPITAL_COMMUNITY): Payer: BLUE CROSS/BLUE SHIELD

## 2017-03-14 ENCOUNTER — Encounter: Payer: Self-pay | Admitting: Internal Medicine

## 2017-03-14 ENCOUNTER — Other Ambulatory Visit: Payer: Self-pay

## 2017-03-14 ENCOUNTER — Encounter (HOSPITAL_COMMUNITY): Payer: BLUE CROSS/BLUE SHIELD

## 2017-03-14 ENCOUNTER — Ambulatory Visit: Payer: BLUE CROSS/BLUE SHIELD | Admitting: Internal Medicine

## 2017-03-14 ENCOUNTER — Other Ambulatory Visit: Payer: Self-pay | Admitting: Internal Medicine

## 2017-03-14 VITALS — BP 119/70 | HR 95 | Temp 98.1°F | Ht 74.0 in | Wt 361.1 lb

## 2017-03-14 DIAGNOSIS — E669 Obesity, unspecified: Principal | ICD-10-CM

## 2017-03-14 DIAGNOSIS — Z6841 Body Mass Index (BMI) 40.0 and over, adult: Secondary | ICD-10-CM

## 2017-03-14 DIAGNOSIS — Z7984 Long term (current) use of oral hypoglycemic drugs: Secondary | ICD-10-CM | POA: Diagnosis not present

## 2017-03-14 DIAGNOSIS — E1142 Type 2 diabetes mellitus with diabetic polyneuropathy: Secondary | ICD-10-CM | POA: Diagnosis not present

## 2017-03-14 DIAGNOSIS — G629 Polyneuropathy, unspecified: Secondary | ICD-10-CM | POA: Insufficient documentation

## 2017-03-14 DIAGNOSIS — E1169 Type 2 diabetes mellitus with other specified complication: Secondary | ICD-10-CM | POA: Diagnosis not present

## 2017-03-14 HISTORY — DX: Type 2 diabetes mellitus with diabetic polyneuropathy: E11.42

## 2017-03-14 LAB — POCT GLYCOSYLATED HEMOGLOBIN (HGB A1C): HEMOGLOBIN A1C: 7.6

## 2017-03-14 LAB — GLUCOSE, CAPILLARY: GLUCOSE-CAPILLARY: 176 mg/dL — AB (ref 65–99)

## 2017-03-14 MED ORDER — SITAGLIPTIN PHOSPHATE 100 MG PO TABS: 100.0000 mg | ORAL_TABLET | Freq: Every day | ORAL | 3 refills | Status: DC

## 2017-03-14 MED ORDER — GLIPIZIDE ER 2.5 MG PO TB24: 5.0000 mg | ORAL_TABLET | Freq: Every day | ORAL | 3 refills | Status: DC

## 2017-03-14 MED ORDER — GABAPENTIN 300 MG PO CAPS: 300.0000 mg | ORAL_CAPSULE | Freq: Three times a day (TID) | ORAL | 3 refills | Status: DC

## 2017-03-14 NOTE — Assessment & Plan Note (Signed)
Assessment:  Neuropathic pain Patient has bilateral foot pain that he describes as burning.  He states the pain has been present for several years and currently increasing in frequency.  Likely due from long standing uncontrolled diabetes.  Will start gabapentin 300mg  at night and titrate as needed.  Plan -start gabapentin

## 2017-03-14 NOTE — Progress Notes (Signed)
   CC: Follow up on type II diabetes  HPI:  [name] is a 32 y.o. male with history noted below that presents to the acute care clinic for follow-up on type 2 diabetes. Please see problem based charting for the status of patient's chronic medical conditions.  Past Medical History:  Diagnosis Date  . Asthma   . CHF (congestive heart failure) (HCC)   . Hypertension   . Obesity   . Type 2 diabetes mellitus with diabetic neuropathy (HCC)     Review of Systems:  Review of Systems  Constitutional: Negative for malaise/fatigue.  Respiratory: Negative for shortness of breath.   Cardiovascular: Negative for chest pain.  Gastrointestinal: Negative for abdominal pain, nausea and vomiting.  Genitourinary: Negative for frequency.  Endo/Heme/Allergies: Negative for polydipsia.     Physical Exam:  Vitals:   03/14/17 1130  BP: 119/70  Pulse: 95  Temp: 98.1 F (36.7 C)  TempSrc: Oral  SpO2: 98%  Weight: (!) 361 lb 1.6 oz (163.8 kg)  Height: 6\' 2"  (1.88 m)   Physical Exam  Constitutional: He is well-developed, well-nourished, and in no distress.  Cardiovascular: Normal rate, regular rhythm and normal heart sounds. Exam reveals no gallop and no friction rub.  No murmur heard. 2+ pedal pulse bilaterally   Pulmonary/Chest: Breath sounds normal. No respiratory distress. He has no wheezes. He has no rales.  Musculoskeletal: He exhibits no edema.    Assessment & Plan:   See encounters tab for problem based medical decision making.   Patient discussed with Dr. Cleda Daub

## 2017-03-14 NOTE — Assessment & Plan Note (Signed)
Assessment:  Neuropathic pain Patient has bilateral foot pain that he describes as burning.  He states the pain has been present for several years and currently increasing in frequency.  Likely due from long standing uncontrolled diabetes.  Will start gabapentin 300mg at night and titrate as needed.  Plan -start gabapentin   

## 2017-03-14 NOTE — Assessment & Plan Note (Signed)
Assessment: Uncontrolled Type II diabetes At last clinic visit 01/02/2017 patient was changed from janumet to [place] and glipizide.  He stated he did not want to take metformin due to possible side effect of rash.  Hemgolobin A1C was 13.5 and random glucose of 404 on 11/2016.  Today's hemoglobin A1C is 7.6 with adherence to [place] and glipizide 2.5mg  daily.  Will increase glipizide to 5mg  daily and continue januvia at current dose.  Plan -continue januvia -increase glipizide to 5mg  daily - Follow up in 3 months

## 2017-03-14 NOTE — Patient Instructions (Signed)
[name],  It was a pleasure seeing you today.  Please start taking gabapentin 300mg  at night and if pain still uncontrolled then take up to 3 tablets as needed.

## 2017-03-15 ENCOUNTER — Telehealth (HOSPITAL_COMMUNITY): Payer: Self-pay | Admitting: Surgery

## 2017-03-15 NOTE — Telephone Encounter (Signed)
Patient has been discharged from [facility] secondary to successful completion.  He will remain to be seen in the AHF Clinic and has been instructed to call for any needs pertaining to his heart failure. 

## 2017-03-15 NOTE — Progress Notes (Signed)
Internal Medicine Clinic Attending  Case discussed with Dr. Hoffman at the time of the visit.  We reviewed the resident's history and exam and pertinent patient test results.  I agree with the assessment, diagnosis, and plan of care documented in the resident's note.  

## 2017-03-16 ENCOUNTER — Encounter (HOSPITAL_COMMUNITY): Payer: BLUE CROSS/BLUE SHIELD

## 2017-03-18 ENCOUNTER — Encounter (HOSPITAL_COMMUNITY): Payer: BLUE CROSS/BLUE SHIELD

## 2017-03-18 ENCOUNTER — Other Ambulatory Visit (HOSPITAL_COMMUNITY): Payer: Self-pay

## 2017-03-18 NOTE — Progress Notes (Signed)
Paramedicine Encounter    Patient ID: [name], male    DOB: 14-May-1985, 32 y.o.   MRN: [id number]   Patient Care Team: Valinda Party, DO as PCP - General (Internal Medicine)  Patient Active Problem List   Diagnosis Date Noted  . Diabetic polyneuropathy associated with type 2 diabetes mellitus ([place]) 03/14/2017  . OSA (obstructive sleep apnea) 12/02/2016  . Chronic systolic (congestive) heart failure ([place])   . Obesity, Class III, BMI 40-49.9 (morbid obesity) ([place]) 10/26/2016  . Asthma, chronic, mild persistent, uncomplicated 24/58/0998  . Essential hypertension 10/26/2016  . Diabetes mellitus type 2 in obese ([place]) 04/20/2016    Current Outpatient Medications:  .  albuterol (PROVENTIL HFA;VENTOLIN HFA) 108 (90 BASE) MCG/ACT inhaler, Inhale 2 puffs into the lungs every 4 (four) hours as needed for wheezing. Dispense with aerochamber, Disp: 1 Inhaler, Rfl: 0 .  Amino Acids (L-CARNITINE PO), Take 1 tablet daily by mouth., Disp: , Rfl:  .  blood glucose meter kit and supplies KIT, Dispense based on patient and insurance preference. Use up to four times daily as directed. (FOR ICD-9 250.00, 250.01). For QAC - HS accuchecks. May switch to any brand., Disp: 1 each, Rfl: 1 .  carvedilol (COREG) 25 MG tablet, Take 0.5 tablets (12.5 mg total) by mouth 2 (two) times daily with a meal., Disp: 30 tablet, Rfl: 11 .  gabapentin (NEURONTIN) 300 MG capsule, Take 1 capsule (300 mg total) by mouth 3 (three) times daily., Disp: 90 capsule, Rfl: 3 .  glipiZIDE (GLUCOTROL XL) 2.5 MG 24 hr tablet, Take 2 tablets (5 mg total) by mouth daily with breakfast., Disp: 60 tablet, Rfl: 3 .  ibuprofen (ADVIL,MOTRIN) 800 MG tablet, Take 800 mg by mouth every 8 (eight) hours., Disp: , Rfl:  .  metolazone (ZAROXOLYN) 2.5 MG tablet, Take 1 tablet (2.5 mg total) by mouth as needed. As directed by HF clinic for weight gain of 3 lbs overnight or 5 lbs within one week., Disp: 10 tablet, Rfl: 2 .   sacubitril-valsartan (ENTRESTO) 97-103 MG, Take 1 tablet 2 (two) times daily by mouth., Disp: 60 tablet, Rfl: 6 .  sitaGLIPtin (JANUVIA) 100 MG tablet, Take 1 tablet (100 mg total) by mouth daily., Disp: 30 tablet, Rfl: 3 .  spironolactone (ALDACTONE) 25 MG tablet, Take 1 tablet (25 mg total) by mouth daily., Disp: 30 tablet, Rfl: 6 .  tiotropium (SPIRIVA) 18 MCG inhalation capsule, Place 18 mcg daily into inhaler and inhale., Disp: , Rfl:  .  torsemide (DEMADEX) 20 MG tablet, Take 1 tablet (20 mg total) by mouth 2 (two) times daily., Disp: 60 tablet, Rfl: 3 Allergies  Allergen Reactions  . Hydrocodone     Hives       Social History   Socioeconomic History  . Marital status: Married    Spouse name: Not on file  . Number of children: Not on file  . Years of education: Not on file  . Highest education level: Not on file  Social Needs  . Financial resource strain: Not on file  . Food insecurity - worry: Not on file  . Food insecurity - inability: Not on file  . Transportation needs - medical: Not on file  . Transportation needs - non-medical: Not on file  Occupational History  . Not on file  Tobacco Use  . Smoking status: Never Smoker  . Smokeless tobacco: Never Used  Substance and Sexual Activity  . Alcohol use: No  . Drug use: No  . Sexual  activity: No  Other Topics Concern  . Not on file  Social History Narrative  . Not on file    Physical Exam  Constitutional: He is oriented to person, place, and time.  Cardiovascular: Normal rate and regular rhythm.  Pulmonary/Chest: Effort normal and breath sounds normal. No respiratory distress. He has no wheezes. He has no rales.  Abdominal: Soft.  Musculoskeletal: Normal range of motion. He exhibits no edema.  Neurological: He is alert and oriented to person, place, and time.  Skin: Skin is warm and dry.  Psychiatric: He has a normal mood and affect.        Future Appointments  Date Time Provider [place]   04/22/2017  9:00 AM Sueanne Margarita, MD [facility]  05/31/2017 10:30 AM [facility] PA/NP [facility] None    BP 100/70 (BP Location: Left Arm, Patient Position: Sitting, Cuff Size: Large)   Pulse 92   Resp 16   Wt (!) 353 lb 6.4 oz (160.3 kg)   SpO2 98%   BMI 45.37 kg/m   Weight yesterday- 349.8 lb Last visit weight- 354 lb  Mr Molyneux was seen at home today and reported feeling unwell. He stated he was feeling tired and dizzy. He stated he started taking gabapentin 3 days ago for neuropathy and said he had not had any issues. When asked further about this he stated he has been taking it with food but took all of his medications this morning on an empty stomach. He denied SOB and said he has not gained weight since his last office visit. His medications were verified. After a thorough evaluation, I advised him that he should call his doctor that prescribed the gabapentin if he did not continue feeling better in the next couple of hours.   Time spent with patient: 39 minutes  [name], EMT 03/18/17  ACTION: Home visit completed Next visit planned for 1 week

## 2017-03-21 ENCOUNTER — Telehealth (HOSPITAL_COMMUNITY): Payer: Self-pay | Admitting: Pharmacist

## 2017-03-21 ENCOUNTER — Encounter (HOSPITAL_COMMUNITY): Payer: BLUE CROSS/BLUE SHIELD

## 2017-03-21 NOTE — Telephone Encounter (Signed)
Entresto PA approved by [employer] commercial through 02/14/38.   Tyler Deis. Bonnye Fava, PharmD, BCPS, CPP Clinical Pharmacist Phone: [phone] 03/21/2017 11:20 AM

## 2017-03-23 ENCOUNTER — Encounter (HOSPITAL_COMMUNITY): Payer: BLUE CROSS/BLUE SHIELD

## 2017-03-25 ENCOUNTER — Encounter (HOSPITAL_COMMUNITY): Payer: BLUE CROSS/BLUE SHIELD

## 2017-03-25 LAB — HM DIABETES EYE EXAM

## 2017-03-28 ENCOUNTER — Encounter: Payer: Self-pay | Admitting: *Deleted

## 2017-03-31 ENCOUNTER — Other Ambulatory Visit (HOSPITAL_COMMUNITY): Payer: Self-pay

## 2017-03-31 NOTE — Progress Notes (Signed)
Paramedicine Encounter    Patient ID: Jonathan [name], male    DOB: May 25, 1985, 32 y.o.   MRN: [id number]   Patient Care Team: Valinda Party, DO as PCP - General (Internal Medicine)  Patient Active Problem List   Diagnosis Date Noted  . Diabetic polyneuropathy associated with type 2 diabetes mellitus ([place]) 03/14/2017  . OSA (obstructive sleep apnea) 12/02/2016  . Chronic systolic (congestive) heart failure ([place])   . Obesity, Class III, BMI 40-49.9 (morbid obesity) ([place]) 10/26/2016  . Asthma, chronic, mild persistent, uncomplicated 12/87/8676  . Essential hypertension 10/26/2016  . Diabetes mellitus type 2 in obese ([place]) 04/20/2016    Current Outpatient Medications:  .  albuterol (PROVENTIL HFA;VENTOLIN HFA) 108 (90 BASE) MCG/ACT inhaler, Inhale 2 puffs into the lungs every 4 (four) hours as needed for wheezing. Dispense with aerochamber, Disp: 1 Inhaler, Rfl: 0 .  Amino Acids (L-CARNITINE PO), Take 1 tablet daily by mouth., Disp: , Rfl:  .  blood glucose meter kit and supplies KIT, Dispense based on patient and insurance preference. Use up to four times daily as directed. (FOR ICD-9 250.00, 250.01). For QAC - HS accuchecks. May switch to any brand., Disp: 1 each, Rfl: 1 .  carvedilol (COREG) 25 MG tablet, Take 0.5 tablets (12.5 mg total) by mouth 2 (two) times daily with a meal., Disp: 30 tablet, Rfl: 11 .  glipiZIDE (GLUCOTROL XL) 2.5 MG 24 hr tablet, Take 2 tablets (5 mg total) by mouth daily with breakfast., Disp: 60 tablet, Rfl: 3 .  metolazone (ZAROXOLYN) 2.5 MG tablet, Take 1 tablet (2.5 mg total) by mouth as needed. As directed by HF clinic for weight gain of 3 lbs overnight or 5 lbs within one week., Disp: 10 tablet, Rfl: 2 .  sacubitril-valsartan (ENTRESTO) 97-103 MG, Take 1 tablet 2 (two) times daily by mouth., Disp: 60 tablet, Rfl: 6 .  sitaGLIPtin (JANUVIA) 100 MG tablet, Take 1 tablet (100 mg total) by mouth daily., Disp: 30 tablet, Rfl: 3 .  spironolactone (ALDACTONE)  25 MG tablet, Take 1 tablet (25 mg total) by mouth daily., Disp: 30 tablet, Rfl: 6 .  torsemide (DEMADEX) 20 MG tablet, Take 1 tablet (20 mg total) by mouth 2 (two) times daily., Disp: 60 tablet, Rfl: 3 .  gabapentin (NEURONTIN) 300 MG capsule, Take 1 capsule (300 mg total) by mouth 3 (three) times daily., Disp: 90 capsule, Rfl: 3 .  ibuprofen (ADVIL,MOTRIN) 800 MG tablet, Take 800 mg by mouth every 8 (eight) hours., Disp: , Rfl:  .  tiotropium (SPIRIVA) 18 MCG inhalation capsule, Place 18 mcg daily into inhaler and inhale., Disp: , Rfl:  Allergies  Allergen Reactions  . Hydrocodone     Hives       Social History   Socioeconomic History  . Marital status: Married    Spouse name: Not on file  . Number of children: Not on file  . Years of education: Not on file  . Highest education level: Not on file  Social Needs  . Financial resource strain: Not on file  . Food insecurity - worry: Not on file  . Food insecurity - inability: Not on file  . Transportation needs - medical: Not on file  . Transportation needs - non-medical: Not on file  Occupational History  . Not on file  Tobacco Use  . Smoking status: Never Smoker  . Smokeless tobacco: Never Used  Substance and Sexual Activity  . Alcohol use: No  . Drug use: No  . Sexual  activity: No  Other Topics Concern  . Not on file  Social History Narrative  . Not on file    Physical Exam  Constitutional: He is oriented to person, place, and time.  Cardiovascular: Normal rate and regular rhythm.  Pulmonary/Chest: Effort normal and breath sounds normal. No respiratory distress. He has no wheezes. He has no rales.  Abdominal: Soft.  Musculoskeletal: Normal range of motion. He exhibits edema.  Neurological: He is alert and oriented to person, place, and time.  Skin: Skin is warm and dry.  Psychiatric: He has a normal mood and affect.        Future Appointments  Date Time Provider [place]  04/22/2017  9:00 AM Sueanne Margarita, MD [facility]  05/31/2017 10:30 AM [facility] PA/NP [facility] None    BP 104/62 (BP Location: Right Arm, Patient Position: Sitting, Cuff Size: Large)   Pulse 90   Wt (!) 359 lb (162.8 kg)   SpO2 97%   BMI 46.09 kg/m   Weight yesterday- 356.4 lb Last visit weight- 353 lb CBG- 259 mg/dL  Mr Lisenbee was seen at home today and reported feeling well. He said his weight was up over the past week but it was because he has stopped taking torsemide. At his last clinic appointment he was told that he could decrease his torsemide as he tolerated it however he said he knew he would gain weight with decreasing the medication. He said he took a metolazone today and would start taking his torsemide again. I have advised the clinic of this and they agree with restarting torsemide. I verified his medications and he fills his own pillbox. We discussed discharge but with his weight gain I feel better waiting another week before letting him go.   Time spent with patient: 27 minutes  [name], EMT 03/31/17  ACTION: Home visit completed Next visit planned for 1 week

## 2017-04-07 ENCOUNTER — Telehealth: Payer: Self-pay | Admitting: Licensed Clinical Social Worker

## 2017-04-07 NOTE — Telephone Encounter (Signed)
CSW received referral to contact patient per Dr. Gala Romney. Patient was attending cardiac rehab and it was suggested that he might benefit from some supportive intervention. CSW contacted and left message for return call. Patient initially was discharged from paramedicine on 1/29 but added issues rose and patient continues with paramedicine. CSW will coordinate with [name] from Paramedicine as needed and await return call from patient. Lasandra Beech, LCSW, CCSW-MCS [phone]

## 2017-04-14 ENCOUNTER — Telehealth (HOSPITAL_COMMUNITY): Payer: Self-pay

## 2017-04-14 NOTE — Telephone Encounter (Signed)
I called Mr Unzicker to schedule an appointment today. He did not answer so I left a voicemail requesting he call me back.

## 2017-04-22 ENCOUNTER — Telehealth: Payer: Self-pay | Admitting: *Deleted

## 2017-04-22 ENCOUNTER — Encounter: Payer: Self-pay | Admitting: Cardiology

## 2017-04-22 ENCOUNTER — Ambulatory Visit: Payer: BLUE CROSS/BLUE SHIELD | Admitting: Cardiology

## 2017-04-22 VITALS — BP 147/88 | HR 88 | Ht 74.0 in | Wt 376.0 lb

## 2017-04-22 DIAGNOSIS — G4733 Obstructive sleep apnea (adult) (pediatric): Secondary | ICD-10-CM

## 2017-04-22 DIAGNOSIS — I10 Essential (primary) hypertension: Secondary | ICD-10-CM

## 2017-04-22 NOTE — Telephone Encounter (Addendum)
Titration ordered and sent to pre cert / sleep pool.

## 2017-04-22 NOTE — Patient Instructions (Signed)
Medication Instructions:  Your physician recommends that you continue on your current medications as directed. Please refer to the Current Medication list given to you today.  Labwork: None Ordered   Testing/Procedures: None Ordered   Follow-Up: Your physician wants you to follow-up as needed with Dr. Mayford Knife   Any Other Special Instructions Will Be Listed Below (If Applicable).  CPAP orders have been placed. You will receive a call from the home health agency regarding setting up equipment. If you do not receive a call within the next week give Coralee North, CPAP assistant a call at [phone].   Thank you for choosing [facility]    Lyda Perone, RN  [phone]    If you need a refill on your cardiac medications before your next appointment, please call your pharmacy.

## 2017-04-22 NOTE — Progress Notes (Signed)
Cardiology Office Note:    Date:  04/22/2017   ID:  [name], DOB [birth year], MRN [id number]  PCP:  Camelia Phenes, DO  Cardiologist:  No primary care provider on file.    Referring MD: Geralyn Corwin Ratlif*   No chief complaint on file.   History of Present Illness:    Jonathan [name] is a 32 y.o. male with a hx of CHF, DM, snoring and witnessed apnea who was referred by Dr. Gala Romney for evaluation of OSA.  He underwent PSG showing very mild OSA with an AHI of 5.2/hr overall, 9.8/hr when sleeping supine and 16.7/hr during REM sleep.  O2 sats dropped to 86% during apneas but the mean O2 sat was 92%.  He is now here to discuss treatment options.  He says that he always feels like he has not slept when he gets up in the an.  He gets sleepy during the day and has to nap frequently.  His girlfriend says that he snores badly and has witnessed him stop breathing in his sleep.  He wakes up 2-3 times to go to the bathroom.    Past Medical History:  Diagnosis Date  . Asthma   . CHF (congestive heart failure) (HCC)   . Hypertension   . Obesity   . Type 2 diabetes mellitus with diabetic neuropathy [facility])     Past Surgical History:  Procedure Laterality Date  . CARDIAC CATHETERIZATION    . FOOT SURGERY     arch surgery    Current Medications: No outpatient medications have been marked as taking for the 04/22/17 encounter (Appointment) with Quintella Reichert, MD.     Allergies:   Hydrocodone   Social History   Socioeconomic History  . Marital status: Married    Spouse name: Not on file  . Number of children: Not on file  . Years of education: Not on file  . Highest education level: Not on file  Social Needs  . Financial resource strain: Not on file  . Food insecurity - worry: Not on file  . Food insecurity - inability: Not on file  . Transportation needs - medical: Not on file  . Transportation needs - non-medical: Not on file  Occupational History  . Not on file    Tobacco Use  . Smoking status: Never Smoker  . Smokeless tobacco: Never Used  Substance and Sexual Activity  . Alcohol use: No  . Drug use: No  . Sexual activity: No  Other Topics Concern  . Not on file  Social History Narrative  . Not on file     Family History: The patient's family history includes Diabetes Mellitus II in his mother; Heart disease in his father and mother; Heart failure in his father and mother; Hypertension in his mother; Kidney disease in his father. There is no history of Congestive Heart Failure.  ROS:   Please see the history of present illness.    ROS  All other systems reviewed and negative.   EKGs/Labs/Other Studies Reviewed:    The following studies were reviewed today: PSG report  EKG:  EKG is not ordered today.   Recent Labs: 10/26/2016: ALT 42; B Natriuretic Peptide 627.5 10/27/2016: Hemoglobin 14.1; Platelets 273; TSH 1.009 11/04/2016: Magnesium 1.9 03/02/2017: BUN 20; Creatinine, Ser 0.98; Potassium 3.9; Sodium 137   Recent Lipid Panel    Component Value Date/Time   CHOL 135 10/27/2016 0221   TRIG 83 10/27/2016 0221   HDL 22 (L) 10/27/2016 0221  CHOLHDL 6.1 10/27/2016 0221   VLDL 17 10/27/2016 0221   LDLCALC 96 10/27/2016 0221    Physical Exam:    VS:  There were no vitals taken for this visit.    Wt Readings from Last 3 Encounters:  03/31/17 (!) 359 lb (162.8 kg)  03/18/17 (!) 353 lb 6.4 oz (160.3 kg)  03/14/17 (!) 361 lb 1.6 oz (163.8 kg)     GEN:  Well nourished, well developed in no acute distress HEENT: enlarged tonsils NECK: No JVD; No carotid bruits LYMPHATICS: No lymphadenopathy CARDIAC: RRR, no murmurs, rubs, gallops RESPIRATORY:  Clear to auscultation without rales, wheezing or rhonchi  ABDOMEN: Soft, non-tender, non-distended MUSCULOSKELETAL:  No edema; No deformity  SKIN: Warm and dry NEUROLOGIC:  Alert and oriented x 3 PSYCHIATRIC:  Normal affect   ASSESSMENT:    1. OSA (obstructive sleep apnea)   2.  Essential hypertension   3. Obesity, Class III, BMI 40-49.9 (morbid obesity) (HCC)    PLAN:    In order of problems listed above:  1.  OSA -his sleep study revealed mild obstructive sleep apnea overall with an AHI of 5.2/h.  Sleeping supine his AHI was 9.8/h but increased to 16.7/h during REM sleep.  The lowest oxygen saturation was 86%.  Given his diabetes mellitus, congestive heart failure and hypertension with LV dysfunction systolic dysfunction, I recommended proceeding with CPAP titration.  We will go ahead and get this set up.  Hypertension  2.  HTN -blood pressure today is well controlled on exam.  He will continue on Entresto 97-103 mg twice daily, carvedilol 25 mg 1/2 tablet twice daily and Aldactone 85 mg daily.  3.  Obesity - I have encouraged him to get into a routine exercise program and cut back on carbs and portions.    Medication Adjustments/Labs and Tests Ordered: Current medicines are reviewed at length with the patient today.  Concerns regarding medicines are outlined above.  No orders of the defined types were placed in this encounter.  No orders of the defined types were placed in this encounter.   Signed, Armanda Magic, MD  04/22/2017 8:47 AM    [facility]

## 2017-04-22 NOTE — Telephone Encounter (Signed)
-----   Message from [place], RN sent at 04/22/2017  9:35 AM EST ----- Regarding: cpap titration  CPAP titration   Thanks  Rena

## 2017-04-29 ENCOUNTER — Other Ambulatory Visit: Payer: Self-pay | Admitting: Internal Medicine

## 2017-04-29 DIAGNOSIS — E1169 Type 2 diabetes mellitus with other specified complication: Secondary | ICD-10-CM

## 2017-04-29 DIAGNOSIS — E669 Obesity, unspecified: Principal | ICD-10-CM

## 2017-04-29 NOTE — Progress Notes (Signed)
Referral to diabetes coordinator

## 2017-05-03 ENCOUNTER — Ambulatory Visit: Payer: BLUE CROSS/BLUE SHIELD | Admitting: Internal Medicine

## 2017-05-03 ENCOUNTER — Encounter: Payer: Self-pay | Admitting: Internal Medicine

## 2017-05-03 ENCOUNTER — Other Ambulatory Visit: Payer: Self-pay

## 2017-05-03 VITALS — BP 124/71 | HR 99 | Temp 98.7°F | Ht 73.5 in

## 2017-05-03 DIAGNOSIS — E1169 Type 2 diabetes mellitus with other specified complication: Secondary | ICD-10-CM | POA: Diagnosis not present

## 2017-05-03 DIAGNOSIS — Z967 Presence of other bone and tendon implants: Secondary | ICD-10-CM

## 2017-05-03 DIAGNOSIS — Z6841 Body Mass Index (BMI) 40.0 and over, adult: Secondary | ICD-10-CM

## 2017-05-03 DIAGNOSIS — Z79899 Other long term (current) drug therapy: Secondary | ICD-10-CM | POA: Diagnosis not present

## 2017-05-03 DIAGNOSIS — M79672 Pain in left foot: Secondary | ICD-10-CM

## 2017-05-03 DIAGNOSIS — I10 Essential (primary) hypertension: Secondary | ICD-10-CM | POA: Diagnosis not present

## 2017-05-03 DIAGNOSIS — E1140 Type 2 diabetes mellitus with diabetic neuropathy, unspecified: Secondary | ICD-10-CM

## 2017-05-03 DIAGNOSIS — Z7984 Long term (current) use of oral hypoglycemic drugs: Secondary | ICD-10-CM

## 2017-05-03 DIAGNOSIS — E669 Obesity, unspecified: Secondary | ICD-10-CM

## 2017-05-03 HISTORY — DX: Pain in left foot: M79.672

## 2017-05-03 NOTE — Assessment & Plan Note (Signed)
I am not sure what the etiology of his current foot pain is.  Certainly his foot is high risk given complexity of prior reconstruction, neuropathy, diabetes and morbid obesity.  The possibility of acute Charcot is on the differential.  Symptoms do not appear as consistent with tendinopathy and there is no history of trauma to otherwise suggest fracture.  He does not have any systemic signs, warmth, or erythema to suggest an infection or acute gout.  I think getting him to see podiatry for further evaluation and treatment is in his best interest.  Plan: - He has been set up with a podiatry appointment on 3/21 - We went ahead and immobilized his foot for now in a CAM walking boot until he can be evaluated by podiatry - Will hold off on imaging at this time and allow podiatry to determine what may or may not be needed in terms of better imaging

## 2017-05-03 NOTE — Progress Notes (Signed)
Applied Cam Walker to pt left foot.   Pt tolerated application very well.  Left Foot.

## 2017-05-03 NOTE — Patient Instructions (Addendum)
FOLLOW-UP INSTRUCTIONS When: with podiatry on Thursday For: foot pain   For pain, you can use tyelnol and ibuprofen as needed for now.

## 2017-05-03 NOTE — Assessment & Plan Note (Signed)
He is currently on Januvia and Glipizide.  Last A1c was 7.6 in January 2019.  Prior A1c was 13.5 in October 2018.  Plan: - Follow up with PCP for A1c monitoring - Continue Januvia and Glipizide.

## 2017-05-03 NOTE — Progress Notes (Signed)
CC: pain in feet  HPI:  [name] is a 32 y.o. man with a past medical history listed below here today for pain in his feet.  His PCP started him on Gabapentin in January 2019 at 300mg  QHS.  Currently his dose is at 300mg  daily.  Has been experiencing some relief of neuropathic pain with this medication.  Unfortunately, 2 weeks ago he developed acute onset of left medial foot pain about the navicular area.  He does not recall any trauma to the area and does not feel anything like the neuropathy pain he felt in January.  He describes this pain as stabbing and sharp.  It is worse with activity such as walking.  Has tried therapy such as ice, heat, elevation with no relief.  There has been no fever, chills, calf swelling, open wounds/ulcers.  Of note, he has a history of reconstructive foot surgery bilaterally with calcaneocuboid fusion as a teenager in [place].  He has screws and plates in both feet as a result.   For details of today's visit and the status of his chronic medical issues please refer to the assessment and plan.   Past Medical History:  Diagnosis Date  . Asthma   . CHF (congestive heart failure) (HCC)   . Hypertension   . Obesity   . Type 2 diabetes mellitus with diabetic neuropathy (HCC)    Review of Systems:  Review of Systems  Constitutional: Negative for chills and fever.  Musculoskeletal: Positive for joint pain.  Skin: Negative for rash.    Physical Exam:  Vitals:   05/03/17 1445  BP: 124/71  Pulse: 99  Temp: 98.7 F (37.1 C)  TempSrc: Oral  SpO2: 100%  Height: 6' 1.5" (1.867 m)   Physical Exam  Constitutional: He is well-developed, well-nourished, and in no distress.  HENT:  Head: Normocephalic and atraumatic.  Pulmonary/Chest: Effort normal.  Musculoskeletal: He exhibits edema, tenderness and deformity.  Both feet have deformity from prior surgery.  Left foot appears to have some mild soft tissue swelling.  Arches are flat bilaterally.  Left  foot is more warm than right.  There is no erythema, rash, or ulcer.  Some pain with ROM of foot and ankle.  ROM is decreased.  Doming fo the forefoot is present. Point tenderness appreciated at medial left foot distal to malleolus near the navicular area.   Psychiatric: Mood and affect normal.    Assessment & Plan:   See Encounters Tab for problem based charting.  Patient discussed with Dr. Oswaldo Done.  Acute pain of left foot I am not sure what the etiology of his current foot pain is.  Certainly his foot is high risk given complexity of prior reconstruction, neuropathy, diabetes and morbid obesity.  The possibility of acute Charcot is on the differential.  Symptoms do not appear as consistent with tendinopathy and there is no history of trauma to otherwise suggest fracture.  He does not have any systemic signs, warmth, or erythema to suggest an infection or acute gout.  I think getting him to see podiatry for further evaluation and treatment is in his best interest.  Plan: - He has been set up with a podiatry appointment on 3/21 - We went ahead and immobilized his foot for now in a CAM walking boot until he can be evaluated by podiatry - Will hold off on imaging at this time and allow podiatry to determine what may or may not be needed in terms of better imaging  Essential hypertension BP: 124/71   Well controlled on current regimen of sacubitril-valsartan 49-51mg , carvedilol 12.5mg  BID, spironolactone 25mg  qd, metolazone 2.5mg  prn and torsemide 20mg  BID.  Follows with cardiology.  Plan: - continue current regimen as above.   Diabetes mellitus type 2 in obese [facility]) He is currently on Januvia and Glipizide.  Last A1c was 7.6 in January 2019.  Prior A1c was 13.5 in October 2018.  Plan: - Follow up with PCP for A1c monitoring - Continue Januvia and Glipizide.

## 2017-05-03 NOTE — Assessment & Plan Note (Signed)
BP: 124/71   Well controlled on current regimen of sacubitril-valsartan 49-51mg , carvedilol 12.5mg  BID, spironolactone 25mg  qd, metolazone 2.5mg  prn and torsemide 20mg  BID.  Follows with cardiology.  Plan: - continue current regimen as above.

## 2017-05-04 NOTE — Progress Notes (Signed)
Internal Medicine Clinic Attending  I saw and evaluated the patient.  I personally confirmed the key portions of the history and exam documented by Dr. Wallace and I reviewed pertinent patient test results.  The assessment, diagnosis, and plan were formulated together and I agree with the documentation in the resident's note. 

## 2017-05-04 NOTE — Telephone Encounter (Signed)
Patient notified and agrees with treatment.

## 2017-05-04 NOTE — Telephone Encounter (Signed)
RE: pre cert   Gaynelle Cage, CMA  Reesa Chew, CMA        I have precerted and scheduled this.his appointment is 05/15/17 if you want to let him know please.

## 2017-05-05 ENCOUNTER — Ambulatory Visit (INDEPENDENT_AMBULATORY_CARE_PROVIDER_SITE_OTHER): Payer: BLUE CROSS/BLUE SHIELD

## 2017-05-05 ENCOUNTER — Ambulatory Visit: Payer: BLUE CROSS/BLUE SHIELD | Admitting: Podiatry

## 2017-05-05 DIAGNOSIS — R600 Localized edema: Secondary | ICD-10-CM | POA: Diagnosis not present

## 2017-05-05 DIAGNOSIS — E11610 Type 2 diabetes mellitus with diabetic neuropathic arthropathy: Secondary | ICD-10-CM | POA: Diagnosis not present

## 2017-05-14 NOTE — Progress Notes (Signed)
Subjective:  Patient ID: Jonathan [name], male    DOB: 1985/05/17,  MRN: [id number]  Chief Complaint  Patient presents with  . Diabetes    diabetic foot exam - A1C down from 13 to 7.6  . Foot Pain    Left foot pain - history Charcot's   32 y.o. male presents with the above complaint.  Reports left foot pain.  States that he has a history of diabetes with neuropathy and Charcot.  A1c is down from 13-7.6.  Pain present for 2 weeks.  Denies nausea vomiting fever chills. Past Medical History:  Diagnosis Date  . Asthma   . CHF (congestive heart failure) ([place])   . Hypertension   . Obesity   . Type 2 diabetes mellitus with diabetic neuropathy [facility])    Past Surgical History:  Procedure Laterality Date  . CARDIAC CATHETERIZATION    . FOOT SURGERY     arch surgery    Current Outpatient Medications:  .  Amino Acids (L-CARNITINE PO), Take 1 tablet daily by mouth., Disp: , Rfl:  .  blood glucose meter kit and supplies KIT, Dispense based on patient and insurance preference. Use up to four times daily as directed. (FOR ICD-9 250.00, 250.01). For QAC - HS accuchecks. May switch to any brand., Disp: 1 each, Rfl: 1 .  carvedilol (COREG) 25 MG tablet, Take 0.5 tablets (12.5 mg total) by mouth 2 (two) times daily with a meal., Disp: 30 tablet, Rfl: 11 .  gabapentin (NEURONTIN) 300 MG capsule, Take 1 capsule (300 mg total) by mouth 3 (three) times daily., Disp: 90 capsule, Rfl: 3 .  glipiZIDE (GLUCOTROL XL) 2.5 MG 24 hr tablet, Take 2 tablets (5 mg total) by mouth daily with breakfast., Disp: 60 tablet, Rfl: 3 .  ibuprofen (ADVIL,MOTRIN) 800 MG tablet, Take 800 mg by mouth every 8 (eight) hours., Disp: , Rfl:  .  metolazone (ZAROXOLYN) 2.5 MG tablet, Take 1 tablet (2.5 mg total) by mouth as needed. As directed by HF clinic for weight gain of 3 lbs overnight or 5 lbs within one week., Disp: 10 tablet, Rfl: 2 .  sacubitril-valsartan (ENTRESTO) 97-103 MG, Take 1 tablet 2 (two) times daily by mouth., Disp:  60 tablet, Rfl: 6 .  sitaGLIPtin (JANUVIA) 100 MG tablet, Take 1 tablet (100 mg total) by mouth daily., Disp: 30 tablet, Rfl: 3 .  spironolactone (ALDACTONE) 25 MG tablet, Take 1 tablet (25 mg total) by mouth daily., Disp: 30 tablet, Rfl: 6 .  tiotropium (SPIRIVA) 18 MCG inhalation capsule, Place 18 mcg daily into inhaler and inhale., Disp: , Rfl:  .  torsemide (DEMADEX) 20 MG tablet, Take 1 tablet (20 mg total) by mouth 2 (two) times daily., Disp: 60 tablet, Rfl: 3  Allergies  Allergen Reactions  . Hydrocodone     Hives    Review of Systems Objective:  There were no vitals filed for this visit. General AA&O x3. Normal mood and affect.  Vascular Dorsalis pedis and posterior tibial pulses  present 2+ bilaterally  Capillary refill normal to all digits. Pedal hair growth normal.  Neurologic Epicritic sensation grossly present.  Dermatologic No open lesions. Interspaces clear of maceration. Nails well groomed and normal in appearance.  Orthopedic: MMT 5/5 in dorsiflexion, plantarflexion, inversion, and eversion. Pes planus bilateral with pain, foot increased warmth swelling   Assessment & Plan:  Patient was evaluated and treated and all questions answered.  ?  Charcot process foot -X-rays taken reviewed no acute osseous abnormality is no erosions  noted -Unna boot applied. -Advised to wear the cam boot that he was previously wearing  Follow up in 2 weeks for repeat x-rays  Return in about 2 weeks (around 05/19/2017).

## 2017-05-15 ENCOUNTER — Ambulatory Visit (HOSPITAL_BASED_OUTPATIENT_CLINIC_OR_DEPARTMENT_OTHER): Payer: BLUE CROSS/BLUE SHIELD | Attending: Cardiology | Admitting: Cardiology

## 2017-05-15 VITALS — Ht 74.0 in | Wt 350.0 lb

## 2017-05-15 DIAGNOSIS — G4733 Obstructive sleep apnea (adult) (pediatric): Secondary | ICD-10-CM | POA: Diagnosis not present

## 2017-05-16 NOTE — Procedures (Signed)
NAME: Jonathan [name] DATE OF BIRTH:  [birth year] MEDICAL RECORD NUMBER [id number]  LOCATION: [facility]  PHYSICIAN: Rielly Brunn  DATE OF STUDY: 05/15/2017  SLEEP STUDY TYPE: Positive Airway Pressure Titration               REFERRING PHYSICIAN: Quintella Reichert, MD   Gender: Male  D.O.B: 1985/12/29  Age (years): 32  Referring Provider: Armanda Magic MD, ABSM  Height (inches): 74  Interpreting Physician: Armanda Magic MD, ABSM  Weight (lbs): 350  RPSGT: Armen Pickup  BMI: 45  MRN: [id number]  Neck Size: 19.50   CLINICAL INFORMATION  The patient is referred for a CPAP titration to treat sleep apnea  SLEEP STUDY TECHNIQUE  As per the AASM Manual for the Scoring of Sleep and Associated Events v2.3 (April 2016) with a hypopnea requiring 4% desaturations. The channels recorded and monitored were frontal, central and occipital EEG, electrooculogram (EOG), submentalis EMG (chin), nasal and oral airflow, thoracic and abdominal wall motion, anterior tibialis EMG, snore microphone, electrocardiogram, and pulse oximetry. Continuous positive airway pressure (CPAP) was initiated at the beginning of the study and titrated to treat sleep-disordered breathing.  MEDICATIONS  Medications self-administered by patient taken the night of the study : ENTRESTO, CARVEDILOL, SPIRONOLACTONE, JANUMET, TORSEMIDE  TECHNICIAN COMMENTS  Comments added by technician: NO RESTROOM VISTED Comments added by scorer: N/A   RESPIRATORY PARAMETERS  Optimal PAP Pressure (cm): 13 AHI at Optimal Pressure (/hr): 0.0  Overall Minimal O2 (%): 89.0 Supine % at Optimal Pressure (%): 79  Minimal O2 at Optimal Pressure (%): 91.0       SLEEP ARCHITECTURE  The study was initiated at 10:56:50 PM and ended at 4:47:09 AM. Sleep onset time was 19.0 minutes and the sleep efficiency was 84.5%%. The total sleep time was 296.0 minutes. The patient spent 3.4%% of the night in stage N1 sleep, 60.1%% in stage N2  sleep, 0.0%% in stage N3 and 36.49% in REM.Stage REM latency was 66.0 minutes Wake after sleep onset was 35.3. Alpha intrusion was absent. Supine sleep was 66.66%.  CARDIAC DATA  The 2 lead EKG demonstrated sinus rhythm. The mean heart rate was 91.6 beats per minute. Other EKG findings include: PVCs.  LEG MOVEMENT DATA  The total Periodic Limb Movements of Sleep (PLMS) were 0. The PLMS index was 0.0. A PLMS index of <15 is considered normal in adults.  IMPRESSIONS  - The optimal PAP pressure was 13 cm of water. - Central sleep apnea was not noted during this titration (CAI = 0.0/h).  - Mild oxygen desaturations were observed during this titration (min O2 = 89.0%).  - No snoring was audible during this study.  - 2-lead EKG demonstrated: PVCs - Clinically significant periodic limb movements were not noted during this study. Arousals associated with PLMs were rare.  DIAGNOSIS  - Obstructive Sleep Apnea (327.23 [G47.33 ICD-10])  RECOMMENDATIONS  - Trial of CPAP therapy on 13 cm H2O with a Large size Fisher&Paykel Full Face Mask Simplus mask and heated humidification. - Avoid alcohol, sedatives and other CNS depressants that may worsen sleep apnea and disrupt normal sleep architecture.  - Sleep hygiene should be reviewed to assess factors that may improve sleep quality.  - Weight management and regular exercise should be initiated or continued.  - Return to Sleep Center for re-evaluation after 10 weeks of therapy   Armanda Magic Diplomate, American Board of Sleep Medicine  ELECTRONICALLY SIGNED ON:  05/16/2017, 10:07 AM [facility] PH: [phone]   FX: [phone] [place]

## 2017-05-19 ENCOUNTER — Telehealth: Payer: Self-pay | Admitting: *Deleted

## 2017-05-19 NOTE — Telephone Encounter (Signed)
Informed patient of titration results and verbalized understanding was indicated. Patient understands a successful PAP titration and doctor Mayford Knife has ordered him a CPAP. Patient understands he will be contacted by [facility] to set up his cpap. Patient understands to call if [facility] does not contact him with new setup in a timely manner. Patient understands they will be called once confirmation has been received from [facility] that they have received their new machine to schedule 10 week follow up appointment.  [facility] notified of new cpap order  Please add to airview Patient was grateful for the call and thanked me.   Marland Kitchen

## 2017-05-19 NOTE — Telephone Encounter (Signed)
Called results lmtcb. 

## 2017-05-19 NOTE — Telephone Encounter (Signed)
-----   Message from Quintella Reichert, MD sent at 05/16/2017 10:09 AM EDT ----- Please let patient know that they had a successful PAP titration and let DME know that orders are in EPIC.  Please set up 10 week OV with me.

## 2017-05-20 ENCOUNTER — Encounter: Payer: Self-pay | Admitting: Podiatry

## 2017-05-20 ENCOUNTER — Ambulatory Visit (INDEPENDENT_AMBULATORY_CARE_PROVIDER_SITE_OTHER): Payer: BLUE CROSS/BLUE SHIELD

## 2017-05-20 ENCOUNTER — Ambulatory Visit: Payer: BLUE CROSS/BLUE SHIELD | Admitting: Podiatry

## 2017-05-20 DIAGNOSIS — E11610 Type 2 diabetes mellitus with diabetic neuropathic arthropathy: Secondary | ICD-10-CM

## 2017-05-20 DIAGNOSIS — R600 Localized edema: Secondary | ICD-10-CM

## 2017-05-20 MED ORDER — CALCITONIN (SALMON) 200 UNIT/ACT NA SOLN: 1.0000 | Freq: Every day | NASAL | 0 refills | Status: DC

## 2017-05-23 ENCOUNTER — Encounter: Payer: Self-pay | Admitting: Podiatry

## 2017-05-24 ENCOUNTER — Telehealth: Payer: Self-pay | Admitting: *Deleted

## 2017-05-24 DIAGNOSIS — E11610 Type 2 diabetes mellitus with diabetic neuropathic arthropathy: Secondary | ICD-10-CM

## 2017-05-24 NOTE — Telephone Encounter (Signed)
Faxed orders to [facility] and emailed information to [employer]

## 2017-05-24 NOTE — Telephone Encounter (Signed)
Pt states he is not to be weight bearing and can not use crutches because he is clumsy, would like a walker. I told pt I would send order to [facility] and they would contact him with insurance coverage and how to pick up the walker.

## 2017-05-25 ENCOUNTER — Telehealth: Payer: Self-pay | Admitting: Podiatry

## 2017-05-25 DIAGNOSIS — E11610 Type 2 diabetes mellitus with diabetic neuropathic arthropathy: Secondary | ICD-10-CM

## 2017-05-25 NOTE — Telephone Encounter (Signed)
This is [employer] and [employer] on behalf of [employer]. I just got off the phone with the people at the medical supply concerning his walker. If you can please give me a call back. My number is [phone]. It would be greatly appreciated if this could be replied to in a timely manner. So again, if you would please call me back at [phone]. Thank you.

## 2017-05-25 NOTE — Telephone Encounter (Signed)
I informed pt's wife, I had received the confirmation the order had been received at [facility].

## 2017-05-25 NOTE — Telephone Encounter (Signed)
I spoke with pt's wife and she states she can pick up the reordered walker to day. I emailed and faxed orders to Peggye Fothergill - [facility].

## 2017-05-25 NOTE — Telephone Encounter (Signed)
The heavy duty walker with no wheels, they don't have in stock. She said if it could be re-written for me to receive a heavy duty walker with wheels I can pick it up as soon as possible. The walker without the wheels won't be available until next Thursday. My call back number is [phone]. Thank you.

## 2017-05-25 NOTE — Addendum Note (Signed)
Addended by: Alphia Kava D on: 05/25/2017 03:51 PM   Modules accepted: Orders

## 2017-05-30 NOTE — Telephone Encounter (Addendum)
Patient has a 10 week follow up appointment scheduled for Wednesday 6/26/ 2019. Patient understands he needs to keep this appointment for insurance compliance. Patient was grateful for the call and thanked me.

## 2017-05-30 NOTE — Progress Notes (Signed)
[facility] Note    Primary Care: No PCP  Primary Cardiologist: Dr. Haroldine Laws   HPI: Jonathan [name] is a 32 y.o. male with history of systolic CHF due to NICM, HTN, DM 2, Asthma, OSA, and morbid obesity (Body mass index is 48.76 kg/m.)  Pt presented to [facility]26/16 with progressive SOB x 2 weeks. He reports h/o systolic CHF with EF 05-39% by Echo in [place] x 1 year ago. Cath around that time without any significant CAD per patient. Recently went for a long period without any insurance, but recently re-obtained through his wifes work. He was diuresed with IV Lasix, discharge weight was 373 pounds. He had run out of most of his medications due to moving to [place] and not having insurance. Meds restarted at discharge: Coreg, Mearl Latin, Lasix.   He presents today for follow up. Echo at last visit showed recovery of EF. He is in a boot for R Charcot foot today. Has been having pain on and off. He states he is in the boot for 3 months and has been instructed to be non-weight bearing. He ran out of his torsemide approx 1 week ago and has felt more swollen since.  Denies lightheadedness or dizziness. Sleep study +. He is getting his CPAP machine tomorrow.   Echo 03/02/2017 EF 55% Personally reviewed  Labs 11/30/16 K 4.7, Cr 0.89  Review of systems complete and found to be negative unless listed in HPI.    Past Medical History:  Diagnosis Date  . Asthma   . CHF (congestive heart failure) ([place])   . Hypertension   . Obesity   . Type 2 diabetes mellitus with diabetic neuropathy ([facility])     Current Outpatient Medications  Medication Sig Dispense Refill  . Amino Acids (L-CARNITINE PO) Take 1 tablet daily by mouth.    . blood glucose meter kit and supplies KIT Dispense based on patient and insurance preference. Use up to four times daily as directed. (FOR ICD-9 250.00, 250.01). For QAC - HS accuchecks. May switch to any brand. 1 each 1  . calcitonin, salmon, (MIACALCIN) 200 UNIT/ACT  nasal spray Place 1 spray into alternate nostrils daily. 3.7 mL 0  . carvedilol (COREG) 25 MG tablet Take 0.5 tablets (12.5 mg total) by mouth 2 (two) times daily with a meal. 30 tablet 11  . gabapentin (NEURONTIN) 300 MG capsule Take 1 capsule (300 mg total) by mouth 3 (three) times daily. 90 capsule 3  . glipiZIDE (GLUCOTROL XL) 2.5 MG 24 hr tablet Take 2 tablets (5 mg total) by mouth daily with breakfast. 60 tablet 3  . ibuprofen (ADVIL,MOTRIN) 800 MG tablet Take 800 mg by mouth every 8 (eight) hours.    . metolazone (ZAROXOLYN) 2.5 MG tablet Take 1 tablet (2.5 mg total) by mouth as needed. As directed by HF clinic for weight gain of 3 lbs overnight or 5 lbs within one week. 10 tablet 2  . sacubitril-valsartan (ENTRESTO) 97-103 MG Take 1 tablet 2 (two) times daily by mouth. 60 tablet 6  . sitaGLIPtin (JANUVIA) 100 MG tablet Take 1 tablet (100 mg total) by mouth daily. 30 tablet 3  . spironolactone (ALDACTONE) 25 MG tablet Take 1 tablet (25 mg total) by mouth daily. 30 tablet 6  . tiotropium (SPIRIVA) 18 MCG inhalation capsule Place 18 mcg daily into inhaler and inhale.    . torsemide (DEMADEX) 20 MG tablet Take 1 tablet (20 mg total) by mouth 2 (two) times daily. 60 tablet 3  No current facility-administered medications for this visit.     Allergies  Allergen Reactions  . Hydrocodone     Hives       Social History   Socioeconomic History  . Marital status: Married    Spouse name: Not on file  . Number of children: Not on file  . Years of education: Not on file  . Highest education level: Not on file  Occupational History  . Not on file  Social Needs  . Financial resource strain: Not on file  . Food insecurity:    Worry: Not on file    Inability: Not on file  . Transportation needs:    Medical: Not on file    Non-medical: Not on file  Tobacco Use  . Smoking status: Never Smoker  . Smokeless tobacco: Never Used  Substance and Sexual Activity  . Alcohol use: No  . Drug  use: No  . Sexual activity: Never  Lifestyle  . Physical activity:    Days per week: 0 days    Minutes per session: 0 min  . Stress: Very much  Relationships  . Social connections:    Talks on phone: Not on file    Gets together: Not on file    Attends religious service: Not on file    Active member of club or organization: Not on file    Attends meetings of clubs or organizations: Not on file    Relationship status: Not on file  . Intimate partner violence:    Fear of current or ex partner: Not on file    Emotionally abused: Not on file    Physically abused: Not on file    Forced sexual activity: Not on file  Other Topics Concern  . Not on file  Social History Narrative  . Not on file   Family History  Problem Relation Age of Onset  . Diabetes Mellitus II Mother   . Heart failure Mother   . Hypertension Mother   . Heart disease Mother   . Heart failure Father   . Kidney disease Father   . Heart disease Father   . Congestive Heart Failure Neg Hx    Vitals:   05/31/17 1100  BP: (!) 132/94  Pulse: (!) 101  SpO2: 98%  Weight: (!) 380 lb 6.4 oz (172.5 kg)   Wt Readings from Last 3 Encounters:  05/31/17 (!) 380 lb 6.4 oz (172.5 kg)  05/15/17 (!) 350 lb (158.8 kg)  04/22/17 (!) 376 lb (170.6 kg)    PHYSICAL EXAM: General: Well appearing. No resp difficulty. HEENT: Normal Neck: Supple. JVP difficult, but appears at least 8-9 cm. Carotids 2+ bilat; no bruits. No thyromegaly or nodule noted. Cor: PMI nondisplaced. Regular, slightly tachy, No M/G/R noted Lungs: CTAB, normal effort. Abdomen: Obese, soft, non-tender, non-distended, no HSM. No bruits or masses. +BS  Extremities: No cyanosis, clubbing, or rash. R and LLE no edema. Left foot in a boot.  Neuro: Alert & orientedx3, cranial nerves grossly intact. moves all 4 extremities w/o difficulty. Affect pleasant   ECG: 11/09/16 personally reviewed: ST, LAFB  ASSESSMENT & PLAN:    1. Chronic systolic CHF due to NICM,  normal cors by cath in [place] in 2017. Echo 10/2016 EF 30-35%.  - NYHA II symptoms - Echo 02/2017 LVEF 55% with complete recovery. Stressed need to stay on medicines - NYHA II-III, confounded currently with boot for Charcot foot.  - Volume status elevated on exam.  - Resume torsemide 20  mg BID - Take metolazone today. Repeat as needed.  - Continue Entresto to 97/103 mg BID. BMET today.  - Continue coreg 12.5 mg BID.  - Continue spiro 25 mg daily.  - Orders have been placed to obtain medical records from [facility] center in [place], [place] - Reinforced fluid restriction to < 2 L daily, sodium restriction to less than 2000 mg daily, and the importance of daily weights.    2. HTN - Mildly elevated but in pain. Resume torsemide as above. No change to other meds.   3. DM2  - Per PCP.  - Should consider switching Glipizide to jardiance as budget tolerates  4. Morbid Obesity - Body mass index is 48.84 kg/m.  5. OSA/OHS - Sleep study +. Gets his CPAP tomorrow.   6. Charcot's foot - Left - History of surgical reconstruction - with active pain. Imaging with no acute fractures. He is in a boot for 3 months. - Activity per Ortho.   BMET today. Meds as above. RTC 4 weeks.   Shirley Friar, PA-C 05/30/17    Greater than 50% of the 25 minute visit was spent in counseling/coordination of care regarding disease state education, salt/fluid restriction, sliding scale diuretics, and medication compliance.

## 2017-05-31 ENCOUNTER — Ambulatory Visit (HOSPITAL_COMMUNITY)
Admission: RE | Admit: 2017-05-31 | Discharge: 2017-05-31 | Disposition: A | Discharge status: Home / Self Care | Payer: BLUE CROSS/BLUE SHIELD | Source: Ambulatory Visit | Attending: Cardiology | Admitting: Cardiology

## 2017-05-31 ENCOUNTER — Encounter (HOSPITAL_COMMUNITY): Payer: Self-pay

## 2017-05-31 VITALS — BP 132/94 | HR 101 | Wt 380.4 lb

## 2017-05-31 DIAGNOSIS — E662 Morbid (severe) obesity with alveolar hypoventilation: Secondary | ICD-10-CM | POA: Diagnosis not present

## 2017-05-31 DIAGNOSIS — Z6841 Body Mass Index (BMI) 40.0 and over, adult: Secondary | ICD-10-CM | POA: Insufficient documentation

## 2017-05-31 DIAGNOSIS — Z7984 Long term (current) use of oral hypoglycemic drugs: Secondary | ICD-10-CM | POA: Diagnosis not present

## 2017-05-31 DIAGNOSIS — E11610 Type 2 diabetes mellitus with diabetic neuropathic arthropathy: Secondary | ICD-10-CM | POA: Insufficient documentation

## 2017-05-31 DIAGNOSIS — I110 Hypertensive heart disease with heart failure: Secondary | ICD-10-CM | POA: Insufficient documentation

## 2017-05-31 DIAGNOSIS — Z833 Family history of diabetes mellitus: Secondary | ICD-10-CM | POA: Insufficient documentation

## 2017-05-31 DIAGNOSIS — E1140 Type 2 diabetes mellitus with diabetic neuropathy, unspecified: Secondary | ICD-10-CM | POA: Diagnosis not present

## 2017-05-31 DIAGNOSIS — I428 Other cardiomyopathies: Secondary | ICD-10-CM | POA: Insufficient documentation

## 2017-05-31 DIAGNOSIS — J45909 Unspecified asthma, uncomplicated: Secondary | ICD-10-CM | POA: Diagnosis not present

## 2017-05-31 DIAGNOSIS — I10 Essential (primary) hypertension: Secondary | ICD-10-CM | POA: Diagnosis not present

## 2017-05-31 DIAGNOSIS — G4733 Obstructive sleep apnea (adult) (pediatric): Secondary | ICD-10-CM

## 2017-05-31 DIAGNOSIS — E669 Obesity, unspecified: Secondary | ICD-10-CM | POA: Diagnosis not present

## 2017-05-31 DIAGNOSIS — E1169 Type 2 diabetes mellitus with other specified complication: Secondary | ICD-10-CM | POA: Diagnosis not present

## 2017-05-31 DIAGNOSIS — M14672 Charcot's joint, left ankle and foot: Secondary | ICD-10-CM | POA: Diagnosis not present

## 2017-05-31 DIAGNOSIS — Z885 Allergy status to narcotic agent status: Secondary | ICD-10-CM | POA: Insufficient documentation

## 2017-05-31 DIAGNOSIS — J4530 Mild persistent asthma, uncomplicated: Secondary | ICD-10-CM | POA: Diagnosis not present

## 2017-05-31 DIAGNOSIS — I5022 Chronic systolic (congestive) heart failure: Secondary | ICD-10-CM | POA: Insufficient documentation

## 2017-05-31 DIAGNOSIS — Z8249 Family history of ischemic heart disease and other diseases of the circulatory system: Secondary | ICD-10-CM | POA: Insufficient documentation

## 2017-05-31 DIAGNOSIS — Z841 Family history of disorders of kidney and ureter: Secondary | ICD-10-CM | POA: Diagnosis not present

## 2017-05-31 DIAGNOSIS — Z79899 Other long term (current) drug therapy: Secondary | ICD-10-CM | POA: Insufficient documentation

## 2017-05-31 HISTORY — DX: Charcot's joint, left ankle and foot: M14.672

## 2017-05-31 LAB — BASIC METABOLIC PANEL
Anion gap: 11 (ref 5–15)
BUN: 21 mg/dL — ABNORMAL HIGH (ref 6–20)
CALCIUM: 9.9 mg/dL (ref 8.9–10.3)
CO2: 25 mmol/L (ref 22–32)
CREATININE: 0.79 mg/dL (ref 0.61–1.24)
Chloride: 99 mmol/L — ABNORMAL LOW (ref 101–111)
GFR calc Af Amer: >60 mL/min (ref >60)
GLUCOSE: 186 mg/dL — AB (ref 65–99)
Potassium: 4.4 mmol/L (ref 3.5–5.1)
Sodium: 135 mmol/L (ref 135–145)

## 2017-05-31 MED ORDER — METOLAZONE 2.5 MG PO TABS: 2.5000 mg | ORAL_TABLET | ORAL | 2 refills | Status: DC | PRN

## 2017-05-31 MED ORDER — TORSEMIDE 20 MG PO TABS: 20.0000 mg | ORAL_TABLET | Freq: Two times a day (BID) | ORAL | 3 refills | Status: DC

## 2017-05-31 NOTE — Progress Notes (Signed)
CSW met with patient, wife and mother. Patient in need of transportation services. CSW assisted with a SCAT application and provided patient with a copy of completed application. Patient verbalizes understanding of follow up and will reach out to CSW if needed. CSW available as needed. Raquel Sarna, [place], [place]

## 2017-05-31 NOTE — Addendum Note (Signed)
Encounter addended by: Marcy Siren, LCSW on: 05/31/2017 2:22 PM  Actions taken: Sign clinical note

## 2017-05-31 NOTE — Patient Instructions (Signed)
Labs today (will call for abnormal results, otherwise no news is good news)  RESTART Torsemide 20 mg (1 Tablet) Twice Daily  Take Metolazone 2.5 mg (1 Tablet) Once today.  Follow up in 4 weeks as scheduled.

## 2017-06-02 ENCOUNTER — Ambulatory Visit (INDEPENDENT_AMBULATORY_CARE_PROVIDER_SITE_OTHER): Payer: BLUE CROSS/BLUE SHIELD | Admitting: Podiatry

## 2017-06-02 ENCOUNTER — Ambulatory Visit (INDEPENDENT_AMBULATORY_CARE_PROVIDER_SITE_OTHER): Payer: BLUE CROSS/BLUE SHIELD

## 2017-06-02 DIAGNOSIS — R600 Localized edema: Secondary | ICD-10-CM | POA: Diagnosis not present

## 2017-06-02 DIAGNOSIS — E11610 Type 2 diabetes mellitus with diabetic neuropathic arthropathy: Secondary | ICD-10-CM | POA: Diagnosis not present

## 2017-06-02 NOTE — Progress Notes (Signed)
other

## 2017-06-07 ENCOUNTER — Ambulatory Visit (INDEPENDENT_AMBULATORY_CARE_PROVIDER_SITE_OTHER): Payer: BLUE CROSS/BLUE SHIELD | Admitting: Dietician

## 2017-06-07 DIAGNOSIS — F339 Major depressive disorder, recurrent, unspecified: Secondary | ICD-10-CM | POA: Diagnosis not present

## 2017-06-07 DIAGNOSIS — E11610 Type 2 diabetes mellitus with diabetic neuropathic arthropathy: Secondary | ICD-10-CM

## 2017-06-07 DIAGNOSIS — E662 Morbid (severe) obesity with alveolar hypoventilation: Secondary | ICD-10-CM | POA: Diagnosis not present

## 2017-06-07 DIAGNOSIS — Z9989 Dependence on other enabling machines and devices: Secondary | ICD-10-CM

## 2017-06-07 DIAGNOSIS — I509 Heart failure, unspecified: Secondary | ICD-10-CM

## 2017-06-07 DIAGNOSIS — Z6841 Body Mass Index (BMI) 40.0 and over, adult: Secondary | ICD-10-CM | POA: Diagnosis not present

## 2017-06-07 DIAGNOSIS — E1169 Type 2 diabetes mellitus with other specified complication: Secondary | ICD-10-CM

## 2017-06-07 DIAGNOSIS — Z7984 Long term (current) use of oral hypoglycemic drugs: Secondary | ICD-10-CM

## 2017-06-07 DIAGNOSIS — E669 Obesity, unspecified: Principal | ICD-10-CM

## 2017-06-07 DIAGNOSIS — Z713 Dietary counseling and surveillance: Secondary | ICD-10-CM

## 2017-06-07 NOTE — Patient Instructions (Addendum)
Maintain weight  3,292 100% Calories/day Mild weight loss 0.5 lb/week 3,042 92% Calories/day Weight loss  1 lb/week 2,792 85% Calories/day Extreme weight loss  2 lb/week 2,292 70% Calories/day  Your aim for 2300-2600 calories a day.   Please make a follow up in 2-4 weeks.

## 2017-06-07 NOTE — Addendum Note (Signed)
Encounter addended by: Graciella Freer, PA-C on: 06/07/2017 4:06 PM  Actions taken: LOS modified, Diagnosis association updated

## 2017-06-08 ENCOUNTER — Telehealth: Payer: Self-pay | Admitting: Podiatry

## 2017-06-08 NOTE — Telephone Encounter (Signed)
I informed pt's wife, I had sent orders to [facility] on the day of his appt and she could contact at [phone].

## 2017-06-08 NOTE — Telephone Encounter (Signed)
Patients wife called and wanted to know if Dr Samuella Cota sent in an order for a wheel chair and bed side commode for Jonathan [name]. If you can give her a call back at [phone]

## 2017-06-08 NOTE — Progress Notes (Signed)
  Medical Nutrition Therapy:  Appt start time: 1420 end time:  1520. Visit # 1  Assessment:  Primary concerns today: weight loss, general health.  Mr. Island is here with his wife for help with meal planning. He is limited in his activity by diabetes complication of Charcot foot on the left and is beginning to have similar problems on the right, lack of sleep due to sleep apnea/OHS and congestive heart failure. Their resources are limited as he is not able to work. He says depression is a problem for him and causes him to eat very large amounts of sweets at times. He is currently not an treatment for his depression although his wife says she is looking into if he can have access to counseling through her employer's benefits. They supplement their food income with EBT and pantry boxes Preferred Learning Style: Auditory, Visual, Hands on Learning Readiness: Contemplating,Ready, Some Change in progress  ANTHROPOMETRICS: Estimated body mass index is 48.84 kg/m as calculated from the following:   Height as of 05/15/17: 6\' 2"  (1.88 m).   Weight as of 05/31/17: 380 lb 6.4 oz (172.5 kg).  WEIGHT HISTORY: Highest: 380# now Lowest- 323# in 2014 SLEEP:sleeping with CPAP x1 week for about 4 hours each night, reports sleep some better, naps during day, wife encourages CPAP with naps MEDICATIONS: Alma Friendly, glipizide, he is opposed to injections but thinks he took [place] in the past BLOOD SUGAR:he doesn't test very often, he reports symptoms of low blood sugar, but was not aware that that is what it was,  Lab Results  Component Value Date   HGBA1C 7.6 03/14/2017   DIETARY INTAKE: Usual eating pattern includes 2-3 meals and ? snacks per day. Everyday foods include loves fruit, sweets, spices.  Avoided foods include canned foods, salt, salty foods.   24-hr recall:  B (6  AM): 16 oz water, meds, oatmeal pie Snk ( AM): protein shake with almond milk L (12-2  PM): ramen noodles with steamed vegetables, 1-2  cups unsweet appleasuce Snk ( PM): juice D ( PM):  Wife cooks when she comes home from work- Public librarian with brown rice, shrimp, salmon, baked chicken, mixed vege  tables, cauliflower rice,  Bedtime- 9PM -12am Beverages: est. >2 liters/day water, 50-60 oz apple juice/day, almond milk  Estimated daily energy needs for weight loss: 2300-2600 calories >125g g protein  Progress Towards Goal(s):  In progress.   Nutritional Diagnosis:  NB-1.1 Food and nutrition-related knowledge deficit As related to lack of sufficient previous training on food and nutrition.  As evidenced by patient and wife's questions, concerns and comments.    Intervention:  Nutrition support for limited resources and education about weight loss and calorie counting. Importance of dealing with depression, diabetes medications to best suite his goals and importance of adequate sleep.  Coordination of care: suggest alternative diabetes medication to glipizide that does not cause hypoglycemia, consider medication for depression as appropriate  Teaching Method Utilized: Visual,Auditory,Hands on Handouts given during visit include: Barriers to learning/adherence to lifestyle change: lack of material resources and competing values Demonstrated degree of understanding via:  Teach Back   Monitoring/Evaluation:  Dietary intake, exercise,questions and concerns, and body weight in 3 week(s). Norm Parcel, RD 06/08/2017 3:44 PM.

## 2017-06-13 NOTE — Progress Notes (Signed)
  Subjective:  Patient ID: Jonathan [name], male    DOB: 1985-03-09,  MRN: [id number]  Chief Complaint  Patient presents with  . Foot Pain    Follow up charcot left   "I twisted it on the way here this morning and it more red, swollen, and painful"   32 y.o. male presents with the above complaint.  States that he twisted his left foot on the way here this morning and is more red swollen painful.  Objective:  There were no vitals filed for this visit. General AA&O x3. Normal mood and affect.  Vascular Dorsalis pedis and posterior tibial pulses  present 2+ bilaterally  Capillary refill normal to all digits. Pedal hair growth normal.  Neurologic Epicritic sensation grossly present.  Dermatologic No open lesions. Interspaces clear of maceration. Nails well groomed and normal in appearance.  Orthopedic: MMT 5/5 in dorsiflexion, plantarflexion, inversion, and eversion. Pes planus bilateral with pain, foot increased warmth swelling   Assessment & Plan:  Patient was evaluated and treated and all questions answered.  Charcot process foot -X-rays taken reviewed evidence of hindfoot Charcot process -Continue immobilization in CAM boot -Rx Calcitonin spray.  Follow up in 2 weeks for repeat x-rays  Return in about 2 weeks (around 06/03/2017) for Charcot F/u.

## 2017-06-13 NOTE — Progress Notes (Signed)
  Subjective:  Patient ID: Jonathan [name], male    DOB: 1985-11-04,  MRN: [id number]  Chief Complaint  Patient presents with  . Foot Pain    Left foot /ankle pain is debilitating - cannot ambulate with crutches, and has great difficulty ambuilating with walker   32 y.o. male presents with the above complaint.  Reports left foot and ankle pain is debilitating having difficulty walking with crutches has difficulty with a walker.  Also started to have right foot pain  Objective:  There were no vitals filed for this visit. General AA&O x3. Normal mood and affect.  Vascular Dorsalis pedis and posterior tibial pulses  present 2+ bilaterally  Capillary refill normal to all digits. Pedal hair growth normal.  Neurologic Epicritic sensation grossly present.  Dermatologic No open lesions. Interspaces clear of maceration. Nails well groomed and normal in appearance.  Orthopedic: MMT 5/5 in dorsiflexion, plantarflexion, inversion, and eversion. Pes planus bilateral with pain, foot increased warmth swelling bilat   Assessment & Plan:  Patient was evaluated and treated and all questions answered.  Charcot process foot -X-rays taken reviewed concern for charcot process bilat. Worsened since priors -Advised wheelchair would be a good option to minimize the amount of weightbearing he performed.  Patient does not think that the wheelchair is conducive to his home.  Discussed significant barriers to getting the pressure off his feet to prevent further problems like ulceration and later infection.  Again discussed with patient importance of nonweightbearing due to concern for Charcot process.  Follow up in 2 weeks for repeat x-rays  No follow-ups on file.

## 2017-06-16 ENCOUNTER — Ambulatory Visit (INDEPENDENT_AMBULATORY_CARE_PROVIDER_SITE_OTHER): Payer: BLUE CROSS/BLUE SHIELD

## 2017-06-16 ENCOUNTER — Ambulatory Visit: Payer: BLUE CROSS/BLUE SHIELD | Admitting: Podiatry

## 2017-06-16 DIAGNOSIS — E11610 Type 2 diabetes mellitus with diabetic neuropathic arthropathy: Secondary | ICD-10-CM

## 2017-06-16 MED ORDER — CALCITONIN (SALMON) 200 UNIT/ACT NA SOLN: 1.0000 | Freq: Every day | NASAL | 0 refills | Status: DC

## 2017-06-16 NOTE — Progress Notes (Signed)
  Subjective:  Patient ID: Jonathan [name], male    DOB: 1985/07/14,  MRN: [id number]  Chief Complaint  Patient presents with  . Diabetes    follow up charcot's disease   32 y.o. male presents with the above complaint.  Presents in wheelchair and cam boot.  Still having significant pain and swelling to the left foot  Objective:  There were no vitals filed for this visit. General AA&O x3. Normal mood and affect.  Vascular Dorsalis pedis and posterior tibial pulses  present 2+ bilaterally  Capillary refill normal to all digits. Pedal hair growth normal.  Neurologic Epicritic sensation grossly present.  Dermatologic No open lesions. Interspaces clear of maceration. Nails well groomed and normal in appearance.  Orthopedic: MMT 5/5 in dorsiflexion, plantarflexion, inversion, and eversion. Pes planus bilateral with pain, foot increased warmth swelling bilat   Assessment & Plan:  Patient was evaluated and treated and all questions answered.  Charcot process foot -X-rays taken reviewed no worsening Charcot process -Left foot still warm and edematous -Continue cam boot to left foot and nonweightbearing -Refill calcitonin spray  Return in about 2 weeks (around 06/30/2017) for Charcot F/u.

## 2017-06-30 ENCOUNTER — Encounter (HOSPITAL_COMMUNITY): Payer: Self-pay

## 2017-06-30 ENCOUNTER — Telehealth (HOSPITAL_COMMUNITY): Payer: Self-pay | Admitting: *Deleted

## 2017-06-30 ENCOUNTER — Ambulatory Visit (INDEPENDENT_AMBULATORY_CARE_PROVIDER_SITE_OTHER): Payer: BLUE CROSS/BLUE SHIELD

## 2017-06-30 ENCOUNTER — Telehealth: Payer: Self-pay | Admitting: *Deleted

## 2017-06-30 ENCOUNTER — Encounter (HOSPITAL_COMMUNITY): Payer: Self-pay | Admitting: *Deleted

## 2017-06-30 ENCOUNTER — Ambulatory Visit: Payer: BLUE CROSS/BLUE SHIELD | Admitting: Podiatry

## 2017-06-30 ENCOUNTER — Ambulatory Visit (HOSPITAL_COMMUNITY)
Admission: RE | Admit: 2017-06-30 | Discharge: 2017-06-30 | Disposition: A | Discharge status: Home / Self Care | Payer: BLUE CROSS/BLUE SHIELD | Source: Ambulatory Visit | Attending: Internal Medicine | Admitting: Internal Medicine

## 2017-06-30 VITALS — BP 156/118 | HR 114 | Wt 379.2 lb

## 2017-06-30 DIAGNOSIS — Z7984 Long term (current) use of oral hypoglycemic drugs: Secondary | ICD-10-CM | POA: Diagnosis not present

## 2017-06-30 DIAGNOSIS — Z8249 Family history of ischemic heart disease and other diseases of the circulatory system: Secondary | ICD-10-CM | POA: Diagnosis not present

## 2017-06-30 DIAGNOSIS — Z885 Allergy status to narcotic agent status: Secondary | ICD-10-CM | POA: Diagnosis not present

## 2017-06-30 DIAGNOSIS — E11610 Type 2 diabetes mellitus with diabetic neuropathic arthropathy: Secondary | ICD-10-CM

## 2017-06-30 DIAGNOSIS — I10 Essential (primary) hypertension: Secondary | ICD-10-CM | POA: Diagnosis not present

## 2017-06-30 DIAGNOSIS — J45909 Unspecified asthma, uncomplicated: Secondary | ICD-10-CM | POA: Insufficient documentation

## 2017-06-30 DIAGNOSIS — E1140 Type 2 diabetes mellitus with diabetic neuropathy, unspecified: Secondary | ICD-10-CM | POA: Insufficient documentation

## 2017-06-30 DIAGNOSIS — I5022 Chronic systolic (congestive) heart failure: Secondary | ICD-10-CM | POA: Insufficient documentation

## 2017-06-30 DIAGNOSIS — R609 Edema, unspecified: Secondary | ICD-10-CM | POA: Diagnosis not present

## 2017-06-30 DIAGNOSIS — Z833 Family history of diabetes mellitus: Secondary | ICD-10-CM | POA: Diagnosis not present

## 2017-06-30 DIAGNOSIS — Z9119 Patient's noncompliance with other medical treatment and regimen: Secondary | ICD-10-CM

## 2017-06-30 DIAGNOSIS — E662 Morbid (severe) obesity with alveolar hypoventilation: Secondary | ICD-10-CM | POA: Insufficient documentation

## 2017-06-30 DIAGNOSIS — Z9114 Patient's other noncompliance with medication regimen: Secondary | ICD-10-CM | POA: Diagnosis not present

## 2017-06-30 DIAGNOSIS — M79605 Pain in left leg: Secondary | ICD-10-CM | POA: Diagnosis not present

## 2017-06-30 DIAGNOSIS — Z91199 Patient's noncompliance with other medical treatment and regimen due to unspecified reason: Secondary | ICD-10-CM

## 2017-06-30 DIAGNOSIS — Z79899 Other long term (current) drug therapy: Secondary | ICD-10-CM | POA: Insufficient documentation

## 2017-06-30 DIAGNOSIS — Z841 Family history of disorders of kidney and ureter: Secondary | ICD-10-CM | POA: Diagnosis not present

## 2017-06-30 DIAGNOSIS — Z6841 Body Mass Index (BMI) 40.0 and over, adult: Secondary | ICD-10-CM | POA: Insufficient documentation

## 2017-06-30 DIAGNOSIS — I110 Hypertensive heart disease with heart failure: Secondary | ICD-10-CM | POA: Diagnosis not present

## 2017-06-30 DIAGNOSIS — I428 Other cardiomyopathies: Secondary | ICD-10-CM | POA: Diagnosis not present

## 2017-06-30 DIAGNOSIS — G4733 Obstructive sleep apnea (adult) (pediatric): Secondary | ICD-10-CM

## 2017-06-30 LAB — BASIC METABOLIC PANEL
Anion gap: 15 (ref 5–15)
BUN: 15 mg/dL (ref 6–20)
CO2: 29 mmol/L (ref 22–32)
Calcium: 9.9 mg/dL (ref 8.9–10.3)
Chloride: 94 mmol/L — ABNORMAL LOW (ref 101–111)
Creatinine, Ser: 1 mg/dL (ref 0.61–1.24)
GFR calc Af Amer: >60 mL/min (ref >60)
GFR calc non Af Amer: >60 mL/min (ref >60)
Glucose, Bld: 229 mg/dL — ABNORMAL HIGH (ref 65–99)
Potassium: 3.8 mmol/L (ref 3.5–5.1)
SODIUM: 138 mmol/L (ref 135–145)

## 2017-06-30 MED ORDER — AMLODIPINE BESYLATE 5 MG PO TABS: 5.0000 mg | ORAL_TABLET | Freq: Every day | ORAL | 11 refills | Status: DC

## 2017-06-30 NOTE — Telephone Encounter (Signed)
Opened in error

## 2017-06-30 NOTE — Progress Notes (Signed)
Advanced Heart Failure Clinic Note    Primary Care: Valinda Party, DO Primary Cardiologist: Dr. Haroldine Laws   HPI: Jonathan [name] is a 32 y.o. male with history of systolic CHF due to NICM, HTN, DM 2, Asthma, OSA, and morbid obesity (Body mass index is 48.76 kg/m.)  Pt presented to [facility] 10/26/16 with progressive SOB x 2 weeks. He reports h/o systolic CHF with EF 10-17% by Echo in [place] x 1 year ago. Cath around that time without any significant CAD per patient. Recently went for a long period without any insurance, but recently re-obtained through his wifes work. He was diuresed with IV Lasix, discharge weight was 373 pounds. He had run out of most of his medications due to moving to [place] and not having insurance. Meds restarted at discharge: Coreg, Mearl Latin, Lasix.   He presents today for HF follow up. Last visit, torsemide was restarted (pt ran out for 1 week prior) with instructions to take metolazone PRN. He is taking torsemide 40 mg BID (ordered 20 mg BID) and took metolazone once yesterday for swelling. Very limited mobility with charcot foot that is non weightbearing per ortho. He denies SOB, orthopnea, or PND. Wearing CPAP every night. No CP or dizziness. He misses his medication 1-2 times/week because he forgets. Not weighing at home. He does not check his BP at home. He has not taken his medications today. He has been eating "everything he shouldn't" due to stress from immobility. He is asking for a letter to help him make his dog a support dog.   Echo 03/02/2017 EF 55% Personally reviewed  Review of systems complete and found to be negative unless listed in HPI.   Past Medical History:  Diagnosis Date  . Asthma   . CHF (congestive heart failure) ([place])   . Hypertension   . Obesity   . Type 2 diabetes mellitus with diabetic neuropathy (HCC)     Current Outpatient Medications  Medication Sig Dispense Refill  . Amino Acids (L-CARNITINE PO) Take 1 tablet daily by mouth.     . blood glucose meter kit and supplies KIT Dispense based on patient and insurance preference. Use up to four times daily as directed. (FOR ICD-9 250.00, 250.01). For QAC - HS accuchecks. May switch to any brand. 1 each 1  . calcitonin, salmon, (MIACALCIN) 200 UNIT/ACT nasal spray Place 1 spray into alternate nostrils daily. 3.7 mL 0  . carvedilol (COREG) 25 MG tablet Take 0.5 tablets (12.5 mg total) by mouth 2 (two) times daily with a meal. 30 tablet 11  . gabapentin (NEURONTIN) 300 MG capsule Take 1 capsule (300 mg total) by mouth 3 (three) times daily. 90 capsule 3  . glipiZIDE (GLUCOTROL XL) 2.5 MG 24 hr tablet Take 2 tablets (5 mg total) by mouth daily with breakfast. 60 tablet 3  . ibuprofen (ADVIL,MOTRIN) 800 MG tablet Take 800 mg by mouth every 8 (eight) hours.    . metolazone (ZAROXOLYN) 2.5 MG tablet Take 1 tablet (2.5 mg total) by mouth as needed. As directed by HF clinic for weight gain of 3 lbs overnight or 5 lbs within one week. 10 tablet 2  . sacubitril-valsartan (ENTRESTO) 97-103 MG Take 1 tablet 2 (two) times daily by mouth. 60 tablet 6  . sitaGLIPtin (JANUVIA) 100 MG tablet Take 1 tablet (100 mg total) by mouth daily. 30 tablet 3  . spironolactone (ALDACTONE) 25 MG tablet Take 1 tablet (25 mg total) by mouth daily. 30 tablet 6  . tiotropium (  SPIRIVA) 18 MCG inhalation capsule Place 18 mcg daily into inhaler and inhale.    . torsemide (DEMADEX) 20 MG tablet Take 1 tablet (20 mg total) by mouth 2 (two) times daily. (Patient taking differently: Take 40 mg by mouth 2 (two) times daily. ) 180 tablet 3   No current facility-administered medications for this encounter.     Allergies  Allergen Reactions  . Hydrocodone     Hives       Social History   Socioeconomic History  . Marital status: Married    Spouse name: Not on file  . Number of children: Not on file  . Years of education: Not on file  . Highest education level: Not on file  Occupational History  . Not on file    Social Needs  . Financial resource strain: Not on file  . Food insecurity:    Worry: Not on file    Inability: Not on file  . Transportation needs:    Medical: Not on file    Non-medical: Not on file  Tobacco Use  . Smoking status: Never Smoker  . Smokeless tobacco: Never Used  Substance and Sexual Activity  . Alcohol use: No  . Drug use: No  . Sexual activity: Never  Lifestyle  . Physical activity:    Days per week: 0 days    Minutes per session: 0 min  . Stress: Very much  Relationships  . Social connections:    Talks on phone: Not on file    Gets together: Not on file    Attends religious service: Not on file    Active member of club or organization: Not on file    Attends meetings of clubs or organizations: Not on file    Relationship status: Not on file  . Intimate partner violence:    Fear of current or ex partner: Not on file    Emotionally abused: Not on file    Physically abused: Not on file    Forced sexual activity: Not on file  Other Topics Concern  . Not on file  Social History Narrative  . Not on file   Family History  Problem Relation Age of Onset  . Diabetes Mellitus II Mother   . Heart failure Mother   . Hypertension Mother   . Heart disease Mother   . Heart failure Father   . Kidney disease Father   . Heart disease Father   . Congestive Heart Failure Neg Hx    Vitals:   06/30/17 1039  BP: (!) 156/118  Pulse: (!) 114  SpO2: 95%  Weight: (!) 379 lb 3.2 oz (172 kg)   Wt Readings from Last 3 Encounters:  06/30/17 (!) 379 lb 3.2 oz (172 kg)  05/31/17 (!) 380 lb 6.4 oz (172.5 kg)  05/15/17 (!) 350 lb (158.8 kg)    PHYSICAL EXAM: General: Obese. No resp difficulty. Arrived in wheelchair.  HEENT: Normal Neck: Supple. JVP difficult to assess, but appears mildly elevated. Carotids 2+ bilat; no bruits. No thyromegaly or nodule noted. Cor: PMI nondisplaced. RRR, No M/G/R noted Lungs: CTAB, normal effort. Abdomen: Obese, soft, non-tender,  +distended, no HSM. No bruits or masses. +BS  Extremities: No cyanosis, clubbing, or rash. R and LLE no edema. Left foot in boot Neuro: Alert & orientedx3, cranial nerves grossly intact. moves all 4 extremities w/o difficulty. Affect pleasant  EKG: Sinus tach 97 bpm, RBBB. Personally reviewed.   ASSESSMENT & PLAN:    1. Chronic systolic CHF  due to NICM, normal cors by cath in [place] in 2017. Echo 10/2016 EF 30-35%.  - Echo 02/2017 LVEF 55% with complete recovery. Stressed need to stay on medicines - NYHA II-III, confounded currently with boot for Charcot foot.  - Volume status difficult to assess, but appears elevated. Weight is only down 1 lb.  - Continue torsemide 40 mg BID. Will have him take another metolazone today. Continue metolazone as needed. BMET today - Continue Entresto to 97/103 mg BID.  - Continue coreg 12.5 mg BID.  - Continue spiro 25 mg daily.  - Orders have been placed to obtain medical records from [facility] center in [place], [place] - Reinforced fluid restriction to < 2 L daily, sodium restriction to less than 2000 mg daily, and the importance of daily weights.   - Refer to HF paramedicine program.   2. HTN - Elevated today. He has not taken his medications this morning and doesn't check at home.   - Start amlodipine 5 mg daily.   3. DM2  - Per PCP. No change.  - Should consider switching Glipizide to jardiance as budget tolerates  4. Morbid Obesity - Body mass index is 48.69 kg/m.  5. OSA/OHS - Sleep study +. Wearing CPAP nightly  6. Charcot's foot - Left - History of surgical reconstruction - with active pain. Imaging with no acute fractures and no worsening Charcot process.  - Activity per Ortho. He is still nonweightbearing with left foot boot.  - Sees ortho again today.  7. Noncompliance - Admits to missing medications at least 1-2 x/week, eating salty foods, and does not weigh at home - HF CSW spoke with him today. Will try to  reengage HF paramedicine  BMET today.  Take metolazone today Start amlodipine 5 mg daily Refer to HF paramedicine program Follow up 4 weeks on APP side Follow up in 2 months with Dr Haroldine Laws.   Georgiana Shore, NP 06/30/17    Greater than 50% of the 25 minute visit was spent in counseling/coordination of care regarding disease state education, salt/fluid restriction, sliding scale diuretics, and medication compliance.

## 2017-06-30 NOTE — Patient Instructions (Addendum)
Routine lab work today. Will notify you of abnormal results, otherwise no news is good news!  START Amlodipine 5 mg tablet once daily.  Take a metolazone tablet once today.  Will refer you to our Heart Failure Paramedicine Program. This program is designed to help support you at home with medication management, vital sign and weight measurements, education about heart failure, and any other needs to support you. A certified EMT will call you to set up your initial appointment in the home, and will be available to you at a weekly basis, free of charge.  Follow up 4 weeks.  Take all medication as prescribed the day of your appointment. Bring all medications with you to your appointment.  Do the following things EVERYDAY: 1) Weigh yourself in the morning before breakfast. Write it down and keep it in a log. 2) Take your medicines as prescribed 3) Eat low salt foods-Limit salt (sodium) to 2000 mg per day.  4) Stay as active as you can everyday 5) Limit all fluids for the day to less than 2 liters

## 2017-06-30 NOTE — Progress Notes (Signed)
Paramedicine CSW Initial Assessment  Jonathan [name] is enrolled in the [employer] Program through [facility] [facility] [facility] Advanced Heart Failure Clinic.  The patient presents today in association with an Advanced Heart Failure Clinic Appointment.  Patient seen today by CSW for follow up/assistance on Health problems and/or Transportation.   Social Determinants impacting successful heart failure regimen:  Housing: patient lives with wife. Food: Do you have enough food? Yes  Do you know and understand healthy eating and how that affects your heart failure diagnosis? Yes although admits to poor choices lately. Do you follow a low salt diet?  No  Utilities: Do you have gas and/or electricity on in your home? Yes  Income: What is your source of income? disability Insurance: Emergency planning/management officer: Do you have transportation to your medical appointments? No - CSW faxed over a [employer] application although patient has not received any follow up. CSW provided patient with original application and suggested he contact [employer] for interview. Patient arrived in clinic today via [employer]. CSW offered to call a taxi home but patient denied stating he will use [employer].     Daily Health Needs: Do you have a scale and weigh each day?  Yes  Are you able to adhere to your medication regimen? Yes  Do you ever take medications differently than prescribed? No  Do you know the zones of Heart Failure?  Yes  Do you know how to contact the HF Clinic appropriately with worsening symptoms or weight increases?  Yes   Do you have any identified obstacles / challenges for adherence to current treatment plan?Yes Patient states he would like his dog to become a service dog. Patient asking clinic to write a letter supporting his request. CSW explained further information would be needed and documentation of training for patient's needs would be required. Patient will explore training programs to become a have service dog.  During conversation  patient did share that his landlord told him he would have to get rid of the dog unless it was a service dog.  CSW discussed referral back to [employer] to provide additional support and education to increase adherence to care plan. Patient agrees to referral.  Patient appears frustrated with his current health situation as he continues to have boot on his L foot limiting his ambulation. He admits to poor eating habits which contribute to his decreased health.    CSW assisted patient with Problem-solving teaching/coping strategies, Supportive Counseling and referral to [employer] with the goal to Increase healthy adjustment to current life circumstances and Increase motivation to adhere to plan of care  Community Paramedicine Program and Heart Failure Clinic will continue to coordinate and monitor patient's treatment plan. CSW continues to be available for identified needs. Lasandra Beech, LCSW, CCSW-MCS [phone]

## 2017-06-30 NOTE — Progress Notes (Signed)
Received FMLA forms for patient's wife from [employer].  Forms completed/signed and faxed today to [phone].  Original forms will be scanned to patient's electronic medical record.

## 2017-06-30 NOTE — Telephone Encounter (Signed)
Left message on pt's home phone explaining the importance and reason for leaving the message with the appt date of 07/01/2017 arrive 11:45am for 12:00pm appt, and to call for more information.

## 2017-06-30 NOTE — Telephone Encounter (Signed)
I informed pt's wife, Pryor Montes of the venous doppler appt 07/01/2017 at 12:00pm arrive 11:45am.

## 2017-06-30 NOTE — Telephone Encounter (Signed)
Faxed orders to [facility]. 

## 2017-07-01 ENCOUNTER — Ambulatory Visit (HOSPITAL_COMMUNITY)
Admission: RE | Admit: 2017-07-01 | Discharge: 2017-07-01 | Disposition: A | Discharge status: Home / Self Care | Payer: BLUE CROSS/BLUE SHIELD | Source: Ambulatory Visit | Attending: Cardiovascular Disease | Admitting: Cardiovascular Disease

## 2017-07-01 DIAGNOSIS — R609 Edema, unspecified: Secondary | ICD-10-CM | POA: Diagnosis present

## 2017-07-01 DIAGNOSIS — M79605 Pain in left leg: Secondary | ICD-10-CM | POA: Diagnosis not present

## 2017-07-05 ENCOUNTER — Encounter: Payer: BLUE CROSS/BLUE SHIELD | Admitting: Dietician

## 2017-07-07 ENCOUNTER — Other Ambulatory Visit (HOSPITAL_COMMUNITY): Payer: Self-pay

## 2017-07-07 NOTE — Progress Notes (Signed)
Paramedicine Encounter    Patient ID: Jonathan [name], male    DOB: March 17, 1985, 32 y.o.   MRN: [id number]   Patient Care Team: Valinda Party, DO as PCP - General (Internal Medicine)  Patient Active Problem List   Diagnosis Date Noted  . Charcot ankle, left 05/31/2017  . Acute pain of left foot 05/03/2017  . Diabetic polyneuropathy associated with type 2 diabetes mellitus ([place]) 03/14/2017  . OSA (obstructive sleep apnea) 12/02/2016  . Chronic systolic (congestive) heart failure ([place])   . Obesity, Class III, BMI 40-49.9 (morbid obesity) ([place]) 10/26/2016  . Asthma, chronic, mild persistent, uncomplicated 94/08/6806  . Essential hypertension 10/26/2016  . Diabetes mellitus type 2 in obese ([place]) 04/20/2016    Current Outpatient Medications:  .  amLODipine (NORVASC) 5 MG tablet, Take 1 tablet (5 mg total) by mouth daily., Disp: 30 tablet, Rfl: 11 .  blood glucose meter kit and supplies KIT, Dispense based on patient and insurance preference. Use up to four times daily as directed. (FOR ICD-9 250.00, 250.01). For QAC - HS accuchecks. May switch to any brand., Disp: 1 each, Rfl: 1 .  calcitonin, salmon, (MIACALCIN) 200 UNIT/ACT nasal spray, Place 1 spray into alternate nostrils daily., Disp: 3.7 mL, Rfl: 0 .  carvedilol (COREG) 25 MG tablet, Take 0.5 tablets (12.5 mg total) by mouth 2 (two) times daily with a meal., Disp: 30 tablet, Rfl: 11 .  gabapentin (NEURONTIN) 300 MG capsule, Take 1 capsule (300 mg total) by mouth 3 (three) times daily., Disp: 90 capsule, Rfl: 3 .  glipiZIDE (GLUCOTROL XL) 2.5 MG 24 hr tablet, Take 2 tablets (5 mg total) by mouth daily with breakfast., Disp: 60 tablet, Rfl: 3 .  metolazone (ZAROXOLYN) 2.5 MG tablet, Take 1 tablet (2.5 mg total) by mouth as needed. As directed by HF clinic for weight gain of 3 lbs overnight or 5 lbs within one week., Disp: 10 tablet, Rfl: 2 .  sacubitril-valsartan (ENTRESTO) 97-103 MG, Take 1 tablet 2 (two) times daily by mouth.,  Disp: 60 tablet, Rfl: 6 .  sitaGLIPtin (JANUVIA) 100 MG tablet, Take 1 tablet (100 mg total) by mouth daily., Disp: 30 tablet, Rfl: 3 .  spironolactone (ALDACTONE) 25 MG tablet, Take 1 tablet (25 mg total) by mouth daily., Disp: 30 tablet, Rfl: 6 .  torsemide (DEMADEX) 20 MG tablet, Take 1 tablet (20 mg total) by mouth 2 (two) times daily. (Patient taking differently: Take 40 mg by mouth 2 (two) times daily. ), Disp: 180 tablet, Rfl: 3 .  Amino Acids (L-CARNITINE PO), Take 1 tablet daily by mouth., Disp: , Rfl:  .  ibuprofen (ADVIL,MOTRIN) 800 MG tablet, Take 800 mg by mouth every 8 (eight) hours., Disp: , Rfl:  .  tiotropium (SPIRIVA) 18 MCG inhalation capsule, Place 18 mcg daily into inhaler and inhale., Disp: , Rfl:  Allergies  Allergen Reactions  . Hydrocodone     Hives       Social History   Socioeconomic History  . Marital status: Married    Spouse name: Not on file  . Number of children: Not on file  . Years of education: Not on file  . Highest education level: Not on file  Occupational History  . Not on file  Social Needs  . Financial resource strain: Not on file  . Food insecurity:    Worry: Not on file    Inability: Not on file  . Transportation needs:    Medical: Not on file    Non-medical:  Not on file  Tobacco Use  . Smoking status: Never Smoker  . Smokeless tobacco: Never Used  Substance and Sexual Activity  . Alcohol use: No  . Drug use: No  . Sexual activity: Never  Lifestyle  . Physical activity:    Days per week: 0 days    Minutes per session: 0 min  . Stress: Very much  Relationships  . Social connections:    Talks on phone: Not on file    Gets together: Not on file    Attends religious service: Not on file    Active member of club or organization: Not on file    Attends meetings of clubs or organizations: Not on file    Relationship status: Not on file  . Intimate partner violence:    Fear of current or ex partner: Not on file    Emotionally  abused: Not on file    Physically abused: Not on file    Forced sexual activity: Not on file  Other Topics Concern  . Not on file  Social History Narrative  . Not on file    Physical Exam  Constitutional: He is oriented to person, place, and time.  Cardiovascular: Regular rhythm. Tachycardia present.  Pulmonary/Chest: Effort normal and breath sounds normal.  Musculoskeletal: Normal range of motion. He exhibits no edema.  Neurological: He is alert and oriented to person, place, and time.  Skin: Skin is warm and dry.        Future Appointments  Date Time Provider [place]  07/14/2017 11:15 AM Evelina Bucy, DPM [facility]  08/10/2017 10:40 AM Radford Pax, Eber Hong, MD [facility]    BP 106/80 (BP Location: Right Arm, Patient Position: Sitting, Cuff Size: Large)   Pulse (!) 107   Resp 16   Wt (!) 382 lb (173.3 kg)   SpO2 97%   BMI 49.05 kg/m   Weight yesterday- did not weigh Last visit weight- 379 lb  [name] was seen at home today. He reported feeling down and said he has not been weighing daily because he is having to wear a boot for Charcot foot. He stated that is also feeling him from being more active. As a result of feeling depressed, he has not been taking his medications regularly leading to his re-referral to the paramedicine program. He advised that there is a surgical option for his foot condition but he does not want to do it and thinks that an amputation would be better for him. I told him that was up to him and the doctors who are managing the condition but if he has the option to keep his leg, he should think long and hard about having it amputated. He stated that he felt like he needs someone to help keep him accountable for his medications so he believes that will be the best way for me to help him get back on track. His medications were verified and his pillbox was filled.   Time spent with patient: 38 minutes  Jonathan [name],  EMT 07/07/17  ACTION: Home visit completed Next visit planned for 2 weeks

## 2017-07-14 ENCOUNTER — Encounter: Payer: Self-pay | Admitting: Podiatry

## 2017-07-14 ENCOUNTER — Ambulatory Visit: Payer: BLUE CROSS/BLUE SHIELD | Admitting: Podiatry

## 2017-07-14 ENCOUNTER — Other Ambulatory Visit (HOSPITAL_COMMUNITY): Payer: Self-pay

## 2017-07-14 DIAGNOSIS — E11610 Type 2 diabetes mellitus with diabetic neuropathic arthropathy: Secondary | ICD-10-CM

## 2017-07-14 NOTE — Progress Notes (Signed)
Paramedicine Encounter    Patient ID: Jonathan [name], male    DOB: [birth year], 32 y.o.   MRN: [id number]   Patient Care Team: Valinda Party, DO as PCP - General (Internal Medicine)  Patient Active Problem List   Diagnosis Date Noted  . Charcot ankle, left 05/31/2017  . Acute pain of left foot 05/03/2017  . Diabetic polyneuropathy associated with type 2 diabetes mellitus ([place]) 03/14/2017  . OSA (obstructive sleep apnea) 12/02/2016  . Chronic systolic (congestive) heart failure ([place])   . Obesity, Class III, BMI 40-49.9 (morbid obesity) ([place]) 10/26/2016  . Asthma, chronic, mild persistent, uncomplicated 56/38/9373  . Essential hypertension 10/26/2016  . Diabetes mellitus type 2 in obese ([place]) 04/20/2016    Current Outpatient Medications:  .  Amino Acids (L-CARNITINE PO), Take 1 tablet daily by mouth., Disp: , Rfl:  .  amLODipine (NORVASC) 5 MG tablet, Take 1 tablet (5 mg total) by mouth daily., Disp: 30 tablet, Rfl: 11 .  calcitonin, salmon, (MIACALCIN) 200 UNIT/ACT nasal spray, Place 1 spray into alternate nostrils daily., Disp: 3.7 mL, Rfl: 0 .  carvedilol (COREG) 25 MG tablet, Take 0.5 tablets (12.5 mg total) by mouth 2 (two) times daily with a meal., Disp: 30 tablet, Rfl: 11 .  gabapentin (NEURONTIN) 300 MG capsule, Take 1 capsule (300 mg total) by mouth 3 (three) times daily., Disp: 90 capsule, Rfl: 3 .  glipiZIDE (GLUCOTROL XL) 2.5 MG 24 hr tablet, Take 2 tablets (5 mg total) by mouth daily with breakfast., Disp: 60 tablet, Rfl: 3 .  metolazone (ZAROXOLYN) 2.5 MG tablet, Take 1 tablet (2.5 mg total) by mouth as needed. As directed by HF clinic for weight gain of 3 lbs overnight or 5 lbs within one week., Disp: 10 tablet, Rfl: 2 .  sacubitril-valsartan (ENTRESTO) 97-103 MG, Take 1 tablet 2 (two) times daily by mouth., Disp: 60 tablet, Rfl: 6 .  sitaGLIPtin (JANUVIA) 100 MG tablet, Take 1 tablet (100 mg total) by mouth daily., Disp: 30 tablet, Rfl: 3 .  spironolactone  (ALDACTONE) 25 MG tablet, Take 1 tablet (25 mg total) by mouth daily., Disp: 30 tablet, Rfl: 6 .  torsemide (DEMADEX) 20 MG tablet, Take 1 tablet (20 mg total) by mouth 2 (two) times daily. (Patient taking differently: Take 40 mg by mouth 2 (two) times daily. ), Disp: 180 tablet, Rfl: 3 .  blood glucose meter kit and supplies KIT, Dispense based on patient and insurance preference. Use up to four times daily as directed. (FOR ICD-9 250.00, 250.01). For QAC - HS accuchecks. May switch to any brand. (Patient not taking: Reported on 07/14/2017), Disp: 1 each, Rfl: 1 .  ibuprofen (ADVIL,MOTRIN) 800 MG tablet, Take 800 mg by mouth every 8 (eight) hours., Disp: , Rfl:  .  tiotropium (SPIRIVA) 18 MCG inhalation capsule, Place 18 mcg daily into inhaler and inhale., Disp: , Rfl:  Allergies  Allergen Reactions  . Hydrocodone     Hives       Social History   Socioeconomic History  . Marital status: Married    Spouse name: Not on file  . Number of children: Not on file  . Years of education: Not on file  . Highest education level: Not on file  Occupational History  . Not on file  Social Needs  . Financial resource strain: Not on file  . Food insecurity:    Worry: Not on file    Inability: Not on file  . Transportation needs:    Medical: Not  on file    Non-medical: Not on file  Tobacco Use  . Smoking status: Never Smoker  . Smokeless tobacco: Never Used  Substance and Sexual Activity  . Alcohol use: No  . Drug use: No  . Sexual activity: Never  Lifestyle  . Physical activity:    Days per week: 0 days    Minutes per session: 0 min  . Stress: Very much  Relationships  . Social connections:    Talks on phone: Not on file    Gets together: Not on file    Attends religious service: Not on file    Active member of club or organization: Not on file    Attends meetings of clubs or organizations: Not on file    Relationship status: Not on file  . Intimate partner violence:    Fear of  current or ex partner: Not on file    Emotionally abused: Not on file    Physically abused: Not on file    Forced sexual activity: Not on file  Other Topics Concern  . Not on file  Social History Narrative  . Not on file    Physical Exam      Future Appointments  Date Time Provider [place]  07/28/2017 10:30 AM Evelina Bucy, DPM [facility]  08/10/2017 10:40 AM Sueanne Margarita, MD [facility]    BP 112/64   Pulse (!) 112   Resp 15   Wt (!) 389 lb (176.4 kg)   SpO2 98%   BMI 49.94 kg/m   Weight yesterday-? Last visit weight-382 [facility]-302  Pt reports he went to foot doc today,  He weighed today but that was with his boot on,  The only med he states he has is his glipizide, he runs out of his meds in 2 days.  After looking at his pill bottles, he does not have his glipizide, he has his other meds though. However some of the pill bottles are older--for ex his carvedilol bottle is dated 1/19--he states he had multiple bottles of them and confirmed that he had been taking them--another pill bottle was dated early April and he should have had new bottle but again he stated that he has been taking them and he had multiple bottles of them.  He last took metolazone last week.  meds verified but he doesn't have all his meds, he reports he does his own pill box--pt able to verbalize how to take his meds correctly.  Pt reports his breathing, he reports with this foot boot on he cant really be active or do anything. He is suppose to have his wife pick up his meds tomor. Pt denies any dizziness, no h/a.  He does not have his test strips due to the costs.  He has the contour next one glucometer.  He also needs f/u about his [facility] app-he reports there was issues about [facility] not getting his app after numerous attempts of sending it in.  His HR is elevated, it appears that his HR has been elevated at most recent doc visits also.   Marylouise Stacks,  [facility] Paramedic  07/14/17

## 2017-07-15 NOTE — Progress Notes (Signed)
  Subjective:  Patient ID: Jonathan [name], male    DOB: [birth year],  MRN: [id number]  Chief Complaint  Patient presents with  . Diabetes    follow up Charcot's   32 y.o. male presents with the above complaint.  States the pain is a little better but the foot is still warm and red.  Objective:  There were no vitals filed for this visit. General AA&O x3. Normal mood and affect.  Vascular Dorsalis pedis and posterior tibial pulses  present 2+ bilaterally  Capillary refill normal to all digits. Pedal hair growth normal.  Neurologic Epicritic sensation grossly present.  Dermatologic No open lesions. Interspaces clear of maceration. Nails well groomed and normal in appearance.  Orthopedic: MMT 5/5 in dorsiflexion, plantarflexion, inversion, and eversion. Pes planus bilateral with pain, foot increased warmth swelling bilat   Assessment & Plan:  Patient was evaluated and treated and all questions answered.  Charcot process foot -No x-rays today left foot still warm and edematous -Continue nonweightbearing and cam boot  New x-rays next visit Return in about 2 weeks (around 07/28/2017) for Charcot f/u L foot.

## 2017-07-20 ENCOUNTER — Other Ambulatory Visit (HOSPITAL_COMMUNITY): Payer: Self-pay

## 2017-07-20 NOTE — Progress Notes (Signed)
Paramedicine Encounter    Patient ID: Jonathan [name], male    DOB: 1985-08-30, 32 y.o.   MRN: [id number]   Patient Care Team: Valinda Party, DO as PCP - General (Internal Medicine)  Patient Active Problem List   Diagnosis Date Noted  . Charcot ankle, left 05/31/2017  . Acute pain of left foot 05/03/2017  . Diabetic polyneuropathy associated with type 2 diabetes mellitus ([place]) 03/14/2017  . OSA (obstructive sleep apnea) 12/02/2016  . Chronic systolic (congestive) heart failure ([place])   . Obesity, Class III, BMI 40-49.9 (morbid obesity) ([place]) 10/26/2016  . Asthma, chronic, mild persistent, uncomplicated 71/16/5790  . Essential hypertension 10/26/2016  . Diabetes mellitus type 2 in obese ([place]) 04/20/2016    Current Outpatient Medications:  .  amLODipine (NORVASC) 5 MG tablet, Take 1 tablet (5 mg total) by mouth daily., Disp: 30 tablet, Rfl: 11 .  calcitonin, salmon, (MIACALCIN) 200 UNIT/ACT nasal spray, Place 1 spray into alternate nostrils daily., Disp: 3.7 mL, Rfl: 0 .  carvedilol (COREG) 25 MG tablet, Take 0.5 tablets (12.5 mg total) by mouth 2 (two) times daily with a meal., Disp: 30 tablet, Rfl: 11 .  gabapentin (NEURONTIN) 300 MG capsule, Take 1 capsule (300 mg total) by mouth 3 (three) times daily., Disp: 90 capsule, Rfl: 3 .  glipiZIDE (GLUCOTROL XL) 2.5 MG 24 hr tablet, Take 2 tablets (5 mg total) by mouth daily with breakfast., Disp: 60 tablet, Rfl: 3 .  sacubitril-valsartan (ENTRESTO) 97-103 MG, Take 1 tablet 2 (two) times daily by mouth., Disp: 60 tablet, Rfl: 6 .  sitaGLIPtin (JANUVIA) 100 MG tablet, Take 1 tablet (100 mg total) by mouth daily., Disp: 30 tablet, Rfl: 3 .  torsemide (DEMADEX) 20 MG tablet, Take 1 tablet (20 mg total) by mouth 2 (two) times daily., Disp: 180 tablet, Rfl: 3 .  Amino Acids (L-CARNITINE PO), Take 1 tablet daily by mouth., Disp: , Rfl:  .  blood glucose meter kit and supplies KIT, Dispense based on patient and insurance preference. Use up to  four times daily as directed. (FOR ICD-9 250.00, 250.01). For QAC - HS accuchecks. May switch to any brand. (Patient not taking: Reported on 07/14/2017), Disp: 1 each, Rfl: 1 .  ibuprofen (ADVIL,MOTRIN) 800 MG tablet, Take 800 mg by mouth every 8 (eight) hours., Disp: , Rfl:  .  metolazone (ZAROXOLYN) 2.5 MG tablet, Take 1 tablet (2.5 mg total) by mouth as needed. As directed by HF clinic for weight gain of 3 lbs overnight or 5 lbs within one week. (Patient not taking: Reported on 07/20/2017), Disp: 10 tablet, Rfl: 2 .  spironolactone (ALDACTONE) 25 MG tablet, Take 1 tablet (25 mg total) by mouth daily. (Patient not taking: Reported on 07/20/2017), Disp: 30 tablet, Rfl: 6 .  tiotropium (SPIRIVA) 18 MCG inhalation capsule, Place 18 mcg daily into inhaler and inhale., Disp: , Rfl:  Allergies  Allergen Reactions  . Hydrocodone     Hives       Social History   Socioeconomic History  . Marital status: Married    Spouse name: Not on file  . Number of children: Not on file  . Years of education: Not on file  . Highest education level: Not on file  Occupational History  . Not on file  Social Needs  . Financial resource strain: Not on file  . Food insecurity:    Worry: Not on file    Inability: Not on file  . Transportation needs:    Medical: Not on  file    Non-medical: Not on file  Tobacco Use  . Smoking status: Never Smoker  . Smokeless tobacco: Never Used  Substance and Sexual Activity  . Alcohol use: No  . Drug use: No  . Sexual activity: Never  Lifestyle  . Physical activity:    Days per week: 0 days    Minutes per session: 0 min  . Stress: Very much  Relationships  . Social connections:    Talks on phone: Not on file    Gets together: Not on file    Attends religious service: Not on file    Active member of club or organization: Not on file    Attends meetings of clubs or organizations: Not on file    Relationship status: Not on file  . Intimate partner violence:    Fear  of current or ex partner: Not on file    Emotionally abused: Not on file    Physically abused: Not on file    Forced sexual activity: Not on file  Other Topics Concern  . Not on file  Social History Narrative  . Not on file    Physical Exam  Constitutional: He is oriented to person, place, and time.  Cardiovascular: Regular rhythm. Tachycardia present.  Pulmonary/Chest: Effort normal and breath sounds normal.  Abdominal: Soft.  Musculoskeletal: Normal range of motion. He exhibits no edema.  Neurological: He is alert and oriented to person, place, and time.  Skin: Skin is warm and dry.  Psychiatric: He has a normal mood and affect.        Future Appointments  Date Time Provider [place]  07/28/2017 10:30 AM Evelina Bucy, DPM [facility]  08/10/2017 10:40 AM Radford Pax, Eber Hong, MD [facility]    BP 122/76 (BP Location: Right Arm, Patient Position: Sitting, Cuff Size: Large)   Pulse (!) 102   Resp 16   Wt (!) 388 lb (176 kg)   SpO2 98%   BMI 49.82 kg/m   Weight yesterday- 390 lb Last visit weight- 389 lb  Jonathan [name] was seen at home today and reported feeling well. He denied SOB, headache, dizziness or orthopnea. He reported being compliant with his medications however said he has been having trouble with [facility] getting his medications and having multiple occasions where they were only filling partial prescriptions and having 2 week wait times on others. With this information we decided to switch his medications to the [facility] on [address]. His medications were verified and his pillbox appeared to have been filled correctly.   Time spent with patient: 28 minutes  [name], EMT 07/20/17  ACTION: Home visit completed Next visit planned for 1 week

## 2017-07-22 ENCOUNTER — Other Ambulatory Visit (HOSPITAL_COMMUNITY): Payer: Self-pay | Admitting: *Deleted

## 2017-07-22 ENCOUNTER — Other Ambulatory Visit: Payer: Self-pay | Admitting: *Deleted

## 2017-07-22 MED ORDER — GLIPIZIDE ER 2.5 MG PO TB24: 5.0000 mg | ORAL_TABLET | Freq: Every day | ORAL | 3 refills | Status: DC

## 2017-07-22 MED ORDER — SPIRONOLACTONE 25 MG PO TABS: 25.0000 mg | ORAL_TABLET | Freq: Every day | ORAL | 6 refills | Status: DC

## 2017-07-22 MED ORDER — SACUBITRIL-VALSARTAN 97-103 MG PO TABS: 1.0000 | ORAL_TABLET | Freq: Two times a day (BID) | ORAL | 6 refills | Status: DC

## 2017-07-24 ENCOUNTER — Encounter (HOSPITAL_COMMUNITY): Payer: Self-pay | Admitting: Emergency Medicine

## 2017-07-24 ENCOUNTER — Emergency Department (HOSPITAL_COMMUNITY)
Admission: EM | Admit: 2017-07-24 | Discharge: 2017-07-25 | Disposition: A | Discharge status: Home / Self Care | Payer: BLUE CROSS/BLUE SHIELD | Attending: Emergency Medicine | Admitting: Emergency Medicine

## 2017-07-24 ENCOUNTER — Other Ambulatory Visit: Payer: Self-pay

## 2017-07-24 DIAGNOSIS — Z79899 Other long term (current) drug therapy: Secondary | ICD-10-CM | POA: Diagnosis not present

## 2017-07-24 DIAGNOSIS — J45909 Unspecified asthma, uncomplicated: Secondary | ICD-10-CM | POA: Insufficient documentation

## 2017-07-24 DIAGNOSIS — I5022 Chronic systolic (congestive) heart failure: Secondary | ICD-10-CM | POA: Diagnosis not present

## 2017-07-24 DIAGNOSIS — M62838 Other muscle spasm: Secondary | ICD-10-CM | POA: Diagnosis not present

## 2017-07-24 DIAGNOSIS — E119 Type 2 diabetes mellitus without complications: Secondary | ICD-10-CM | POA: Diagnosis not present

## 2017-07-24 DIAGNOSIS — I110 Hypertensive heart disease with heart failure: Secondary | ICD-10-CM | POA: Insufficient documentation

## 2017-07-24 DIAGNOSIS — E1140 Type 2 diabetes mellitus with diabetic neuropathy, unspecified: Secondary | ICD-10-CM | POA: Diagnosis not present

## 2017-07-24 LAB — CBC
HCT: 38.2 % — ABNORMAL LOW (ref 39.0–52.0)
Hemoglobin: 12.8 g/dL — ABNORMAL LOW (ref 13.0–17.0)
MCH: 30.4 pg (ref 26.0–34.0)
MCHC: 33.5 g/dL (ref 30.0–36.0)
MCV: 90.7 fL (ref 78.0–100.0)
PLATELETS: 258 10*3/uL (ref 150–400)
RBC: 4.21 MIL/uL — AB (ref 4.22–5.81)
RDW: 12.9 % (ref 11.5–15.5)
WBC: 9.7 10*3/uL (ref 4.0–10.5)

## 2017-07-24 LAB — COMPREHENSIVE METABOLIC PANEL
ALBUMIN: 3.9 g/dL (ref 3.5–5.0)
ALK PHOS: 40 U/L (ref 38–126)
ALT: 15 U/L — AB (ref 17–63)
AST: 27 U/L (ref 15–41)
Anion gap: 13 (ref 5–15)
BUN: 18 mg/dL (ref 6–20)
CALCIUM: 9.5 mg/dL (ref 8.9–10.3)
CHLORIDE: 96 mmol/L — AB (ref 101–111)
CO2: 25 mmol/L (ref 22–32)
CREATININE: 1.01 mg/dL (ref 0.61–1.24)
GFR calc Af Amer: >60 mL/min (ref >60)
GFR calc non Af Amer: >60 mL/min (ref >60)
GLUCOSE: 192 mg/dL — AB (ref 65–99)
Potassium: 4.2 mmol/L (ref 3.5–5.1)
SODIUM: 134 mmol/L — AB (ref 135–145)
Total Bilirubin: 1.5 mg/dL — ABNORMAL HIGH (ref 0.3–1.2)
Total Protein: 7.4 g/dL (ref 6.5–8.1)

## 2017-07-24 LAB — I-STAT TROPONIN, ED: Troponin i, poc: 0.01 ng/mL (ref 0.00–0.08)

## 2017-07-24 LAB — LIPASE, BLOOD: LIPASE: 22 U/L (ref 11–51)

## 2017-07-24 NOTE — ED Triage Notes (Signed)
Pt to ED via [facility] for cramping/ "muscle spasms" to R thigh x 45 min that moved into lower abd and then upper abd and L upper arm.  States pain is now decreased to 1/10.  Denies nausea, vomiting, and diarrhea.

## 2017-07-25 MED ORDER — POTASSIUM CHLORIDE CRYS ER 20 MEQ PO TBCR
40.0000 meq | EXTENDED_RELEASE_TABLET | Freq: Once | ORAL | Status: AC
Start: 2017-07-25 — End: 2017-07-25
  Administered 2017-07-25: 40 meq via ORAL
  Filled 2017-07-25: qty 2

## 2017-07-25 NOTE — ED Provider Notes (Signed)
[facility] EMERGENCY DEPARTMENT Provider Note   CSN: [id number] Arrival date & time: 07/24/17  2143     History   Chief Complaint Chief Complaint  Patient presents with  . Abdominal Pain  . Leg Pain  . Arm Pain    HPI Jonathan [name] is a 32 y.o. male.  32 year old male with a history of hypertension, obesity, CHF, diabetes presents to the emergency department for evaluation of spasms.  Patient reports that his symptoms began around 2200 tonight.  He describes a "spasm" type/cramping sensation in his right thigh which migrated to his right lower abdomen and subsequently to his left upper quadrant and left arm.  He states that symptoms lasted for approximately 45 minutes before spontaneously resolving.  He is currently asymptomatic.  No medications taken prior to arrival for symptoms.  No history of similar complaints.  He denies any nausea, vomiting, diarrhea.  No recent fevers or difficulty voiding.  He had a normal bowel movement yesterday.  No history of abdominal surgeries.     Past Medical History:  Diagnosis Date  . Asthma   . CHF (congestive heart failure) ([place])   . Hypertension   . Obesity   . Type 2 diabetes mellitus with diabetic neuropathy [facility])     Patient Active Problem List   Diagnosis Date Noted  . Charcot ankle, left 05/31/2017  . Acute pain of left foot 05/03/2017  . Diabetic polyneuropathy associated with type 2 diabetes mellitus ([place]) 03/14/2017  . OSA (obstructive sleep apnea) 12/02/2016  . Chronic systolic (congestive) heart failure ([place])   . Obesity, Class III, BMI 40-49.9 (morbid obesity) ([place]) 10/26/2016  . Asthma, chronic, mild persistent, uncomplicated 35/32/9924  . Essential hypertension 10/26/2016  . Diabetes mellitus type 2 in obese ([place]) 04/20/2016    Past Surgical History:  Procedure Laterality Date  . CARDIAC CATHETERIZATION    . FOOT SURGERY     arch surgery        Home Medications    Prior to Admission  medications   Medication Sig Start Date End Date Taking? Authorizing Provider  Amino Acids (L-CARNITINE PO) Take 1 tablet daily by mouth.    [provider]  amLODipine (NORVASC) 5 MG tablet Take 1 tablet (5 mg total) by mouth daily. 06/30/17 09/28/17  Georgiana Shore, NP  blood glucose meter kit and supplies KIT Dispense based on patient and insurance preference. Use up to four times daily as directed. (FOR ICD-9 250.00, 250.01). For QAC - HS accuchecks. May switch to any brand. Patient not taking: Reported on 07/14/2017 10/29/16   Thurnell Lose, MD  calcitonin, salmon, (MIACALCIN) 200 UNIT/ACT nasal spray Place 1 spray into alternate nostrils daily. 06/16/17   Evelina Bucy, DPM  carvedilol (COREG) 25 MG tablet Take 0.5 tablets (12.5 mg total) by mouth 2 (two) times daily with a meal. 02/02/17   Tillery, Satira Mccallum, PA-C  gabapentin (NEURONTIN) 300 MG capsule Take 1 capsule (300 mg total) by mouth 3 (three) times daily. 03/14/17   Kalman Shan Ratliff, DO  glipiZIDE (GLUCOTROL XL) 2.5 MG 24 hr tablet Take 2 tablets (5 mg total) by mouth daily with breakfast. 07/22/17   Kalman Shan Ratliff, DO  ibuprofen (ADVIL,MOTRIN) 800 MG tablet Take 800 mg by mouth every 8 (eight) hours.    [provider]  metolazone (ZAROXOLYN) 2.5 MG tablet Take 1 tablet (2.5 mg total) by mouth as needed. As directed by HF clinic for weight gain of 3 lbs overnight or  5 lbs within one week. Patient not taking: Reported on 07/20/2017 05/31/17   Shirley Friar, PA-C  sacubitril-valsartan (ENTRESTO) 97-103 MG Take 1 tablet by mouth 2 (two) times daily. 07/22/17   Shirley Friar, PA-C  sitaGLIPtin (JANUVIA) 100 MG tablet Take 1 tablet (100 mg total) by mouth daily. 03/14/17   Valinda Party, DO  spironolactone (ALDACTONE) 25 MG tablet Take 1 tablet (25 mg total) by mouth daily. 07/22/17   Shirley Friar, PA-C  tiotropium (SPIRIVA) 18 MCG inhalation capsule Place 18 mcg  daily into inhaler and inhale.    [provider]  torsemide (DEMADEX) 20 MG tablet Take 1 tablet (20 mg total) by mouth 2 (two) times daily. 05/31/17   Shirley Friar, PA-C    Family History Family History  Problem Relation Age of Onset  . Diabetes Mellitus II Mother   . Heart failure Mother   . Hypertension Mother   . Heart disease Mother   . Heart failure Father   . Kidney disease Father   . Heart disease Father   . Congestive Heart Failure Neg Hx     Social History Social History   Tobacco Use  . Smoking status: Never Smoker  . Smokeless tobacco: Never Used  Substance Use Topics  . Alcohol use: No  . Drug use: No     Allergies   Hydrocodone   Review of Systems Review of Systems Ten systems reviewed and are negative for acute change, except as noted in the HPI.    Physical Exam Updated Vital Signs BP 129/70 (BP Location: Right Arm)   Pulse 96   Temp 98.5 F (36.9 C) (Oral)   Resp 15   SpO2 100%   Physical Exam  Constitutional: He is oriented to person, place, and time. He appears well-developed and well-nourished. No distress.  Morbidly obese. Nontoxic appearing.  HENT:  Head: Normocephalic and atraumatic.  Eyes: Conjunctivae and EOM are normal. No scleral icterus.  Neck: Normal range of motion.  Cardiovascular: Regular rhythm and intact distal pulses.  Borderline tachycardia with NSR.  Pulmonary/Chest: Effort normal. No stridor. No respiratory distress. He has no wheezes.  Lungs CTAB  Abdominal:  Soft, obese abdomen. No involuntary guarding or rigidity. No palpable masses.  Musculoskeletal: Normal range of motion.  CAM walker on LLE General tenderness with palpation anywhere to body (extremities, abdomen).  Neurological: He is alert and oriented to person, place, and time.  Moving all extremities spontaneously. GCS 15.  Skin: Skin is warm and dry. No rash noted. He is not diaphoretic. No erythema. No pallor.  Psychiatric: He has a  normal mood and affect. His behavior is normal.  Nursing note and vitals reviewed.    ED Treatments / Results  Labs (all labs ordered are listed, but only abnormal results are displayed) Labs Reviewed  COMPREHENSIVE METABOLIC PANEL - Abnormal; Notable for the following components:      Result Value   Sodium 134 (*)    Chloride 96 (*)    Glucose, Bld 192 (*)    ALT 15 (*)    Total Bilirubin 1.5 (*)    All other components within normal limits  CBC - Abnormal; Notable for the following components:   RBC 4.21 (*)    Hemoglobin 12.8 (*)    HCT 38.2 (*)    All other components within normal limits  LIPASE, BLOOD  I-STAT TROPONIN, ED    EKG EKG Interpretation  Date/Time:  Sunday July 24 2017 22:01:51  EDT Ventricular Rate:  107 PR Interval:  140 QRS Duration: 154 QT Interval:  388 QTC Calculation: 517 R Axis:   -168 Text Interpretation:  Sinus tachycardia Right bundle branch block , plus right ventricular hypertrophy Inferior infarct , age undetermined Abnormal ECG Confirmed by Wandra Arthurs [phone]) on 07/24/2017 10:07:57 PM   Radiology No results found.  Procedures Procedures (including critical care time)  Medications Ordered in ED Medications  potassium chloride SA (K-DUR,KLOR-CON) CR tablet 40 mEq (40 mEq Oral Given 07/25/17 0252)     Initial Impression / Assessment and Plan / ED Course  I have reviewed the triage vital signs and the nursing notes.  Pertinent labs & imaging results that were available during my care of the patient were reviewed by me and considered in my medical decision making (see chart for details).     32 year old male presents to the emergency department for complaints of extremity and abdominal spasms.  This is of unclear etiology; however, symptoms spontaneously resolved prior to my assessment of the patient.  He is resting comfortably.  Exam and laboratory work-up reassuring.  He was given a dose of 67m potassium PO as his resulting  specimen was hemolyzed.  Patient to follow-up with his primary care doctor for repeat assessment.  I do not believe further emergent work-up is indicated at this time.  Return precautions discussed and provided. Patient discharged in stable condition with no unaddressed concerns.   Final Clinical Impressions(s) / ED Diagnoses   Final diagnoses:  Muscle spasm    ED Discharge Orders    None       HAntonietta Breach PA-C 07/25/17 [id number]   WRipley Fraise MD 07/25/17 [id number]

## 2017-07-25 NOTE — Discharge Instructions (Addendum)
Your work-up in the emergency department was reassuring.  It is unclear what caused your symptoms prior to arrival.  It is not thought to be life-threatening or concerning at present time.  We recommend follow-up with your primary care doctor for repeat assessment.  You may return to the emergency department for new or concerning symptoms.

## 2017-07-26 ENCOUNTER — Telehealth (HOSPITAL_COMMUNITY): Payer: Self-pay

## 2017-07-27 NOTE — Telephone Encounter (Signed)
I called Jonathan [name] to schedule an appointment. He did not answer so I left a voicemail requesting he call me back to schedule an appointment.

## 2017-07-28 ENCOUNTER — Ambulatory Visit: Payer: BLUE CROSS/BLUE SHIELD | Admitting: Podiatry

## 2017-07-28 ENCOUNTER — Telehealth (HOSPITAL_COMMUNITY): Payer: Self-pay

## 2017-07-28 ENCOUNTER — Ambulatory Visit (INDEPENDENT_AMBULATORY_CARE_PROVIDER_SITE_OTHER): Payer: BLUE CROSS/BLUE SHIELD

## 2017-07-28 DIAGNOSIS — E11610 Type 2 diabetes mellitus with diabetic neuropathic arthropathy: Secondary | ICD-10-CM

## 2017-07-28 NOTE — Telephone Encounter (Signed)
I called [name] to schedule an appointment. He stated he was at the foot doctor today and preferred to meet tomorrow. We agreed to meet tomorrow at 11:00

## 2017-07-29 ENCOUNTER — Other Ambulatory Visit (HOSPITAL_COMMUNITY): Payer: Self-pay

## 2017-07-29 ENCOUNTER — Other Ambulatory Visit: Payer: Self-pay | Admitting: Podiatry

## 2017-07-29 NOTE — Progress Notes (Signed)
Paramedicine Encounter    Patient ID: Jonathan [name], male    DOB: 02-18-1985, 32 y.o.   MRN: [id number]   Patient Care Team: Valinda Party, DO as PCP - General (Internal Medicine)  Patient Active Problem List   Diagnosis Date Noted  . Charcot ankle, left 05/31/2017  . Acute pain of left foot 05/03/2017  . Diabetic polyneuropathy associated with type 2 diabetes mellitus ([place]) 03/14/2017  . OSA (obstructive sleep apnea) 12/02/2016  . Chronic systolic (congestive) heart failure ([place])   . Obesity, Class III, BMI 40-49.9 (morbid obesity) ([place]) 10/26/2016  . Asthma, chronic, mild persistent, uncomplicated 47/42/5956  . Essential hypertension 10/26/2016  . Diabetes mellitus type 2 in obese ([place]) 04/20/2016    Current Outpatient Medications:  .  amLODipine (NORVASC) 5 MG tablet, Take 1 tablet (5 mg total) by mouth daily., Disp: 30 tablet, Rfl: 11 .  blood glucose meter kit and supplies KIT, Dispense based on patient and insurance preference. Use up to four times daily as directed. (FOR ICD-9 250.00, 250.01). For QAC - HS accuchecks. May switch to any brand., Disp: 1 each, Rfl: 1 .  calcitonin, salmon, (MIACALCIN) 200 UNIT/ACT nasal spray, Place 1 spray into alternate nostrils daily., Disp: 3.7 mL, Rfl: 0 .  carvedilol (COREG) 25 MG tablet, Take 0.5 tablets (12.5 mg total) by mouth 2 (two) times daily with a meal., Disp: 30 tablet, Rfl: 11 .  gabapentin (NEURONTIN) 300 MG capsule, Take 1 capsule (300 mg total) by mouth 3 (three) times daily., Disp: 90 capsule, Rfl: 3 .  glipiZIDE (GLUCOTROL XL) 2.5 MG 24 hr tablet, Take 2 tablets (5 mg total) by mouth daily with breakfast., Disp: 60 tablet, Rfl: 3 .  sacubitril-valsartan (ENTRESTO) 97-103 MG, Take 1 tablet by mouth 2 (two) times daily., Disp: 60 tablet, Rfl: 6 .  sitaGLIPtin (JANUVIA) 100 MG tablet, Take 1 tablet (100 mg total) by mouth daily., Disp: 30 tablet, Rfl: 3 .  spironolactone (ALDACTONE) 25 MG tablet, Take 1 tablet (25 mg  total) by mouth daily., Disp: 30 tablet, Rfl: 6 .  torsemide (DEMADEX) 20 MG tablet, Take 1 tablet (20 mg total) by mouth 2 (two) times daily., Disp: 180 tablet, Rfl: 3 .  Amino Acids (L-CARNITINE PO), Take 1 tablet daily by mouth., Disp: , Rfl:  .  ibuprofen (ADVIL,MOTRIN) 800 MG tablet, Take 800 mg by mouth every 8 (eight) hours., Disp: , Rfl:  .  metolazone (ZAROXOLYN) 2.5 MG tablet, Take 1 tablet (2.5 mg total) by mouth as needed. As directed by HF clinic for weight gain of 3 lbs overnight or 5 lbs within one week. (Patient not taking: Reported on 07/20/2017), Disp: 10 tablet, Rfl: 2 .  tiotropium (SPIRIVA) 18 MCG inhalation capsule, Place 18 mcg daily into inhaler and inhale., Disp: , Rfl:  Allergies  Allergen Reactions  . Hydrocodone     Hives       Social History   Socioeconomic History  . Marital status: Married    Spouse name: Not on file  . Number of children: Not on file  . Years of education: Not on file  . Highest education level: Not on file  Occupational History  . Not on file  Social Needs  . Financial resource strain: Not on file  . Food insecurity:    Worry: Not on file    Inability: Not on file  . Transportation needs:    Medical: Not on file    Non-medical: Not on file  Tobacco Use  .  Smoking status: Never Smoker  . Smokeless tobacco: Never Used  Substance and Sexual Activity  . Alcohol use: No  . Drug use: No  . Sexual activity: Never  Lifestyle  . Physical activity:    Days per week: 0 days    Minutes per session: 0 min  . Stress: Very much  Relationships  . Social connections:    Talks on phone: Not on file    Gets together: Not on file    Attends religious service: Not on file    Active member of club or organization: Not on file    Attends meetings of clubs or organizations: Not on file    Relationship status: Not on file  . Intimate partner violence:    Fear of current or ex partner: Not on file    Emotionally abused: Not on file     Physically abused: Not on file    Forced sexual activity: Not on file  Other Topics Concern  . Not on file  Social History Narrative  . Not on file    Physical Exam  Constitutional: He is oriented to person, place, and time.  Cardiovascular: Normal rate and regular rhythm.  Pulmonary/Chest: Effort normal and breath sounds normal.  Abdominal: Soft.  Musculoskeletal: Normal range of motion. He exhibits edema.  Neurological: He is alert and oriented to person, place, and time.  Skin: Skin is warm and dry.  Psychiatric: He has a normal mood and affect.        Future Appointments  Date Time Provider [place]  08/10/2017 10:40 AM Sueanne Margarita, MD [facility]  09/01/2017  9:45 AM March Rummage, Christian Mate, DPM [facility]    BP 118/76 (BP Location: Right Arm, Patient Position: Sitting, Cuff Size: Large)   Pulse 99   Resp (!) 98   Wt (!) 390 lb (176.9 kg)   BMI 50.07 kg/m   Weight yesterday- 387.3 lb Last visit weight- 388 lb  Jonathan [name] was seen at home today and reported feeling "fine." He is in pain from his foot but denied SOB, headache, dizziness or orthopnea. He reported being compliant with his medications which were verified and I checked his pillbox to ensure it was filled correctly. He was reluctant to weigh today because of his foot but relented and was found to have a 3 lb weight gain. I contacted the clinic who advised him to take an extra metolazone. He was understanding and agreeable.   [name], EMT 07/29/17  ACTION: Home visit completed Next visit planned for 1 week

## 2017-08-02 NOTE — Telephone Encounter (Signed)
Dr. Samuella Cota approved the Miacalcin/Fortical nasal spray.

## 2017-08-10 ENCOUNTER — Ambulatory Visit: Payer: BLUE CROSS/BLUE SHIELD | Admitting: Cardiology

## 2017-08-10 ENCOUNTER — Telehealth: Payer: Self-pay | Admitting: *Deleted

## 2017-08-10 ENCOUNTER — Encounter: Payer: Self-pay | Admitting: Cardiology

## 2017-08-10 VITALS — BP 124/78 | HR 108 | Ht 74.0 in | Wt 392.8 lb

## 2017-08-10 DIAGNOSIS — G4733 Obstructive sleep apnea (adult) (pediatric): Secondary | ICD-10-CM | POA: Diagnosis not present

## 2017-08-10 DIAGNOSIS — I10 Essential (primary) hypertension: Secondary | ICD-10-CM

## 2017-08-10 NOTE — Progress Notes (Signed)
Cardiology Office Note:    Date:  08/10/2017   ID:  Jonathan [name], DOB [birth year], MRN [id number]  PCP:  Valinda Party, DO  Cardiologist:  No primary care provider on file.    Referring MD: Kalman Shan Ratlif*   Chief Complaint  Patient presents with  . Sleep Apnea  . Hypertension    History of Present Illness:    Jonathan [name] is a 32 y.o. male with a hx of mild OSA with an AHI of 5.2/hr overall, 9.8/hr when sleeping supine and 16.7/hr during REM sleep.  O2 sats dropped to 86% during apneas but the mean O2 sat was 92%.  He had successful CPAP titration 13 cm H2O.  He is now here for follow-up.  He is doing well with his CPAP device.  He tolerates the mask and feels the pressure is adequate.  Since going on CPAP he feels rested in the am and has no significant daytime sleepiness.  He denies any significant mouth or nasal dryness or nasal congestion.  He does not think that he snores.     Past Medical History:  Diagnosis Date  . Acute pain of left foot 05/03/2017  . Asthma   . Asthma, chronic, mild persistent, uncomplicated 05/24/8117  . Charcot ankle, left 05/31/2017  . CHF (congestive heart failure) ([place])   . Chronic systolic (congestive) heart failure ([place])   . Diabetes mellitus type 2 in obese ([place]) 04/20/2016  . Diabetic polyneuropathy associated with type 2 diabetes mellitus ([place]) 03/14/2017  . Essential hypertension 10/26/2016  . Hypertension   . Obesity   . Obesity, Class III, BMI 40-49.9 (morbid obesity) ([place]) 10/26/2016  . OSA (obstructive sleep apnea) 12/02/2016  . Type 2 diabetes mellitus with diabetic neuropathy [facility])     Past Surgical History:  Procedure Laterality Date  . CARDIAC CATHETERIZATION    . FOOT SURGERY     arch surgery    Current Medications: Current Meds  Medication Sig  . Amino Acids (L-CARNITINE PO) Take 1 tablet daily by mouth.  Marland Kitchen amLODipine (NORVASC) 5 MG tablet Take 1 tablet (5 mg total) by mouth daily.  . blood glucose meter  kit and supplies KIT Dispense based on patient and insurance preference. Use up to four times daily as directed. (FOR ICD-9 250.00, 250.01). For QAC - HS accuchecks. May switch to any brand.  . calcitonin, salmon, (MIACALCIN/FORTICAL) 200 UNIT/ACT nasal spray PLACE 1 SPRAY INTO ALTERNATE NOSTRILS DAILY  . carvedilol (COREG) 25 MG tablet Take 0.5 tablets (12.5 mg total) by mouth 2 (two) times daily with a meal.  . gabapentin (NEURONTIN) 300 MG capsule Take 1 capsule (300 mg total) by mouth 3 (three) times daily.  Marland Kitchen glipiZIDE (GLUCOTROL XL) 2.5 MG 24 hr tablet Take 2 tablets (5 mg total) by mouth daily with breakfast.  . ibuprofen (ADVIL,MOTRIN) 800 MG tablet Take 800 mg by mouth every 8 (eight) hours.  . metolazone (ZAROXOLYN) 2.5 MG tablet Take 1 tablet (2.5 mg total) by mouth as needed. As directed by HF clinic for weight gain of 3 lbs overnight or 5 lbs within one week.  . sacubitril-valsartan (ENTRESTO) 97-103 MG Take 1 tablet by mouth 2 (two) times daily.  . sitaGLIPtin (JANUVIA) 100 MG tablet Take 1 tablet (100 mg total) by mouth daily.  Marland Kitchen spironolactone (ALDACTONE) 25 MG tablet Take 1 tablet (25 mg total) by mouth daily.  Marland Kitchen tiotropium (SPIRIVA) 18 MCG inhalation capsule Place 18 mcg daily into inhaler and inhale.  . torsemide (DEMADEX)  20 MG tablet Take 1 tablet (20 mg total) by mouth 2 (two) times daily.     Allergies:   Hydrocodone   Social History   Socioeconomic History  . Marital status: Married    Spouse name: Not on file  . Number of children: Not on file  . Years of education: Not on file  . Highest education level: Not on file  Occupational History  . Not on file  Social Needs  . Financial resource strain: Not on file  . Food insecurity:    Worry: Not on file    Inability: Not on file  . Transportation needs:    Medical: Not on file    Non-medical: Not on file  Tobacco Use  . Smoking status: Never Smoker  . Smokeless tobacco: Never Used  Substance and Sexual  Activity  . Alcohol use: No  . Drug use: No  . Sexual activity: Never  Lifestyle  . Physical activity:    Days per week: 0 days    Minutes per session: 0 min  . Stress: Very much  Relationships  . Social connections:    Talks on phone: Not on file    Gets together: Not on file    Attends religious service: Not on file    Active member of club or organization: Not on file    Attends meetings of clubs or organizations: Not on file    Relationship status: Not on file  Other Topics Concern  . Not on file  Social History Narrative  . Not on file     Family History: The patient's family history includes Diabetes Mellitus II in his mother; Heart disease in his father and mother; Heart failure in his father and mother; Hypertension in his mother; Kidney disease in his father. There is no history of Congestive Heart Failure.  ROS:   Please see the history of present illness.    ROS  All other systems reviewed and negative.   EKGs/Labs/Other Studies Reviewed:    The following studies were reviewed today: PAP download  EKG:  EKG is not ordered today.   Recent Labs: 10/26/2016: B Natriuretic Peptide 627.5 10/27/2016: TSH 1.009 11/04/2016: Magnesium 1.9 07/24/2017: ALT 15; BUN 18; Creatinine, Ser 1.01; Hemoglobin 12.8; Platelets 258; Potassium 4.2; Sodium 134   Recent Lipid Panel    Component Value Date/Time   CHOL 135 10/27/2016 0221   TRIG 83 10/27/2016 0221   HDL 22 (L) 10/27/2016 0221   CHOLHDL 6.1 10/27/2016 0221   VLDL 17 10/27/2016 0221   LDLCALC 96 10/27/2016 0221    Physical Exam:    VS:  BP 124/78 (BP Location: Right Arm, Patient Position: Sitting, Cuff Size: Large)   Pulse (!) 108   Ht 6' 2"  (1.88 m)   Wt (!) 392 lb 12.8 oz (178.2 kg)   SpO2 96%   BMI 50.43 kg/m     Wt Readings from Last 3 Encounters:  08/10/17 (!) 392 lb 12.8 oz (178.2 kg)  07/29/17 (!) 390 lb (176.9 kg)  07/20/17 (!) 388 lb (176 kg)     GEN:  Well nourished, well developed in no acute  distress HEENT: Normal NECK: No JVD; No carotid bruits LYMPHATICS: No lymphadenopathy CARDIAC: RRR, no murmurs, rubs, gallops RESPIRATORY:  Clear to auscultation without rales, wheezing or rhonchi  ABDOMEN: Soft, non-tender, non-distended MUSCULOSKELETAL:  No edema; No deformity  SKIN: Warm and dry NEUROLOGIC:  Alert and oriented x 3 PSYCHIATRIC:  Normal affect   ASSESSMENT:  1. OSA (obstructive sleep apnea)   2. Essential hypertension   3. Obesity, Class III, BMI 40-49.9 (morbid obesity) ([place])    PLAN:    In order of problems listed above:  1.  OSA - the patient is tolerating PAP therapy but has not been compliant with his device because he is having a lot of problems with feeling like he cannot breathe with his mouth opens up.  He uses a full facemask and it works well when he is breathing through his nose but if he starts to breathe through his mouth he wakes up and cannot use the device.. The PAP download was reviewed today and showed an AHI of 0.7/hr on 13 cm H2O with 10% compliance in using more than 4 hours nightly.  The patient has been using and benefiting from PAP use and will continue to benefit from therapy.  I am going to add a chinstrap to try to keep him from breathing through his mouth and we will place him on AutoPap from 4 to 14 cm H2O to see if he tolerates that pressure better.  I will see him back in 1 month but I asked him to call earlier if he is still struggling with his device.  He knows he needs to be compliant using his device 70% of the time more than 4 hours a night.  2.  HTN -BP is well controlled on exam today.  He will continue on Entresto 97-103 mg twice daily, carvedilol 12.5 mg twice daily, spironolactone 25 mg daily daily and amlodipine 5 mg daily.  3.  Obesity - I have encouraged him to get into a routine exercise program and cut back on carbs and portions.    Medication Adjustments/Labs and Tests Ordered: Current medicines are reviewed at length  with the patient today.  Concerns regarding medicines are outlined above.  No orders of the defined types were placed in this encounter.  No orders of the defined types were placed in this encounter.   Signed, Fransico Him, MD  08/10/2017 11:18 AM    [place]

## 2017-08-10 NOTE — Patient Instructions (Signed)
Medication Instructions:  Your physician recommends that you continue on your current medications as directed. Please refer to the Current Medication list given to you today.  If you need a refill on your cardiac medications, please contact your pharmacy first.  Labwork: None ordered   Testing/Procedures: None ordered   Follow-Up: Your physician recommends that you schedule a follow-up appointment in: 4 weeks with Dr. Mayford Knife   Any Other Special Instructions Will Be Listed Below (If Applicable).   Thank you for choosing [facility]    Lyda Perone, RN  [phone]  If you need a refill on your cardiac medications before your next appointment, please call your pharmacy.

## 2017-08-10 NOTE — Telephone Encounter (Signed)
Order faxed to [facility] today  Set CPAP on auto at 4-14 cm water pressure and add chin strap

## 2017-08-10 NOTE — Telephone Encounter (Signed)
Set CPAP on auto at 4-14 cm water pressure and add chin strap  Order faxed to [facility] today

## 2017-08-10 NOTE — Telephone Encounter (Signed)
-----   Message from Phineas Semen, RN sent at 08/10/2017 11:36 AM EDT ----- Regarding: dme order  DME order placed  Thanks  Rena

## 2017-08-14 NOTE — Progress Notes (Signed)
  Subjective:  Patient ID: Jonathan [name], male    DOB: [birth year],  MRN: [id number]  Chief Complaint  Patient presents with  . Diabetes    bilateral foot pain due to Charcot's joint disease   32 y.o. male presents with the above complaint denies any complaints.  Objective:  There were no vitals filed for this visit. General AA&O x3. Normal mood and affect.  Vascular Dorsalis pedis and posterior tibial pulses  present 2+ bilaterally  Capillary refill normal to all digits. Pedal hair growth normal.  Neurologic Epicritic sensation grossly present.  Dermatologic No open lesions. Interspaces clear of maceration. Nails well groomed and normal in appearance.  Orthopedic: MMT 5/5 in dorsiflexion, plantarflexion, inversion, and eversion. Pes planus bilateral with pain, foot increased warmth swelling bilat   Assessment & Plan:  Patient was evaluated and treated and all questions answered.  Charcot process foot -X-rays taken today no worsening erosions.  New x-rays next visit Return in about 1 month (around 08/25/2017) for Charcot F/u , Left.

## 2017-08-20 ENCOUNTER — Other Ambulatory Visit: Payer: Self-pay | Admitting: Internal Medicine

## 2017-08-22 ENCOUNTER — Telehealth: Payer: Self-pay | Admitting: Licensed Clinical Social Worker

## 2017-08-22 ENCOUNTER — Telehealth (HOSPITAL_COMMUNITY): Payer: Self-pay | Admitting: Cardiology

## 2017-08-22 NOTE — Telephone Encounter (Signed)
Patients caregiver came to office to meet with CSW during appointment requested a letter of support for food stamp application. Reports letter should state diagnosis, work limitations and restrictions.   Advised I would forward to person for Dr Gala Romney who could assist with letter however patient/caregiver may want to request from PCP as this maybe better addressed with PCP

## 2017-08-22 NOTE — Telephone Encounter (Signed)
CSW received request from patient's wife to assist with [employer] application. The previous application was missing the last two pages of the application. CSW re faxed paperwork to [employer] per request. CSW available as needed. Lasandra Beech, LCSW, CCSW-MCS [phone]

## 2017-08-24 ENCOUNTER — Telehealth: Payer: Self-pay | Admitting: *Deleted

## 2017-08-24 NOTE — Telephone Encounter (Addendum)
Jasmine December ([facility]) called stating patient has not been compliant on his cpap using it only 18% resulting in [facility] going out to pick up the unit because his insurance will not pay for it. Patient states he could not tolerate the unit. Sharon at [facility] says he can start over with a baseline study.

## 2017-08-26 ENCOUNTER — Ambulatory Visit: Payer: Self-pay

## 2017-08-26 ENCOUNTER — Ambulatory Visit: Payer: BLUE CROSS/BLUE SHIELD | Admitting: Podiatry

## 2017-08-26 DIAGNOSIS — M21072 Valgus deformity, not elsewhere classified, left ankle: Secondary | ICD-10-CM | POA: Diagnosis not present

## 2017-08-26 DIAGNOSIS — M21071 Valgus deformity, not elsewhere classified, right ankle: Secondary | ICD-10-CM | POA: Diagnosis not present

## 2017-08-26 DIAGNOSIS — E11610 Type 2 diabetes mellitus with diabetic neuropathic arthropathy: Secondary | ICD-10-CM | POA: Diagnosis not present

## 2017-08-26 NOTE — Progress Notes (Signed)
  Subjective:  Patient ID: Jonathan [name], male    DOB: 1985/07/12,  MRN: [id number]  Chief Complaint  Patient presents with  . Diabetes    Charcot's disease, bilateral   32 y.o. male presents with the above complaint  Doing better. Able to walk more. Not having pain in the feet.  Objective:  There were no vitals filed for this visit. General AA&O x3. Normal mood and affect.  Vascular Dorsalis pedis and posterior tibial pulses  present 2+ bilaterally  Capillary refill normal to all digits. Pedal hair growth normal.  Neurologic Epicritic sensation grossly present.  Dermatologic No open lesions. Interspaces clear of maceration. Nails well groomed and normal in appearance.  Orthopedic: MMT 5/5 in dorsiflexion, plantarflexion, inversion, and eversion. Pes planus bilateral with pain, no increased warmth; some swelling bilat noted. Ankle valgus bilat.   Assessment & Plan:  Patient was evaluated and treated and all questions answered.  Charcot process foot -Appears resolved. -Transition out of boot.  Severe Pes Planus bilat -Likely benefit from ankle foot orthosis. Will make appointment with orthotist for eval.  No follow-ups on file.

## 2017-09-01 ENCOUNTER — Ambulatory Visit: Payer: BLUE CROSS/BLUE SHIELD | Admitting: Orthotics

## 2017-09-01 ENCOUNTER — Ambulatory Visit: Payer: BLUE CROSS/BLUE SHIELD | Admitting: Podiatry

## 2017-09-01 DIAGNOSIS — M21072 Valgus deformity, not elsewhere classified, left ankle: Secondary | ICD-10-CM

## 2017-09-01 DIAGNOSIS — E1169 Type 2 diabetes mellitus with other specified complication: Secondary | ICD-10-CM

## 2017-09-01 DIAGNOSIS — E669 Obesity, unspecified: Secondary | ICD-10-CM

## 2017-09-01 DIAGNOSIS — E11610 Type 2 diabetes mellitus with diabetic neuropathic arthropathy: Secondary | ICD-10-CM

## 2017-09-01 DIAGNOSIS — M21071 Valgus deformity, not elsewhere classified, right ankle: Secondary | ICD-10-CM

## 2017-09-01 NOTE — Progress Notes (Signed)
Patient presents today for evaluation/casting for AFO brace (L).   Patient has hx of the following conditions: Gait instability,  Ankle instabilty,  DJD, and Diabetic Charcot Patient wants to be mobile however left ankle/charcot causes too much discomort.  and could benefit in aggressive ankle support.  Patient chose [place] brace w/ lace/speed laces.

## 2017-09-04 ENCOUNTER — Other Ambulatory Visit: Payer: Self-pay | Admitting: Podiatry

## 2017-09-04 NOTE — Progress Notes (Signed)
  Subjective:  Patient ID: Jonathan [name], male    DOB: [birth year],  MRN: [id number]  Chief Complaint  Patient presents with  . Diabetes    follow up Charcot's disease  . Foot Pain    bilateral - hopes to get out out of the boot and into a shoe   32 y.o. male presents with the above complaint.  Reporting swelling of the left foot.  Objective:  There were no vitals filed for this visit. General AA&O x3. Normal mood and affect.  Vascular Dorsalis pedis and posterior tibial pulses  present 2+ bilaterally  Capillary refill normal to all digits. Pedal hair growth normal. Edema left lower extremity  Neurologic Epicritic sensation grossly present.  Dermatologic No open lesions. Interspaces clear of maceration. Nails well groomed and normal in appearance.  Orthopedic: MMT 5/5 in dorsiflexion, plantarflexion, inversion, and eversion. Pes planus bilateral with pain, foot increased warmth swelling bilat   Assessment & Plan:  Patient was evaluated and treated and all questions answered.  Charcot process foot -X-rays taken reviewed no worsening Charcot process -Rule out DVT left lower extremity  Return in about 2 weeks (around 07/14/2017).

## 2017-09-05 ENCOUNTER — Telehealth: Payer: Self-pay | Admitting: Licensed Clinical Social Worker

## 2017-09-05 ENCOUNTER — Encounter (HOSPITAL_COMMUNITY): Payer: Self-pay

## 2017-09-05 NOTE — Telephone Encounter (Signed)
Dr. Samuella Cota states pt does not need to continue Miacalcin.

## 2017-09-05 NOTE — Telephone Encounter (Signed)
Patient called requesting a letter for food stamps. CSW assisted with obtaining letter and patient's wife to pick up. Lasandra Beech, LCSW, CCSW-MCS [phone]

## 2017-09-08 ENCOUNTER — Ambulatory Visit: Payer: BLUE CROSS/BLUE SHIELD | Admitting: Podiatry

## 2017-09-15 ENCOUNTER — Ambulatory Visit: Payer: BLUE CROSS/BLUE SHIELD | Admitting: Cardiology

## 2017-09-22 ENCOUNTER — Other Ambulatory Visit: Payer: BLUE CROSS/BLUE SHIELD | Admitting: Orthotics

## 2017-09-22 ENCOUNTER — Telehealth: Payer: Self-pay | Admitting: *Deleted

## 2017-09-22 ENCOUNTER — Ambulatory Visit: Payer: BLUE CROSS/BLUE SHIELD | Admitting: Podiatry

## 2017-09-22 DIAGNOSIS — M21072 Valgus deformity, not elsewhere classified, left ankle: Secondary | ICD-10-CM

## 2017-09-22 DIAGNOSIS — M21071 Valgus deformity, not elsewhere classified, right ankle: Secondary | ICD-10-CM

## 2017-09-22 DIAGNOSIS — E11610 Type 2 diabetes mellitus with diabetic neuropathic arthropathy: Secondary | ICD-10-CM | POA: Diagnosis not present

## 2017-09-22 NOTE — Telephone Encounter (Signed)
Informed patient of compliance results and verbalized understanding was indicated. Patient is aware and agreeable to AHI being within range at 0.6. Patient is aware and agreeable to improving in compliance with machine usage.  

## 2017-09-22 NOTE — Telephone Encounter (Signed)
-----   Message from Traci R Turner, MD sent at 09/16/2017  4:45 PM EDT ----- Good AHI on PAP but needs to improve compliance 

## 2017-09-23 ENCOUNTER — Ambulatory Visit: Payer: BLUE CROSS/BLUE SHIELD | Admitting: Podiatry

## 2017-09-23 ENCOUNTER — Telehealth (HOSPITAL_COMMUNITY): Payer: Self-pay

## 2017-09-23 NOTE — Telephone Encounter (Signed)
Pt called to schedule a [facility] visit due to him being referred back into our program. Pt agrees on Tuesday afternoon. He stated that he still has a pill box and scale.

## 2017-09-28 ENCOUNTER — Other Ambulatory Visit (HOSPITAL_COMMUNITY): Payer: Self-pay

## 2017-09-28 ENCOUNTER — Encounter (HOSPITAL_COMMUNITY): Payer: Self-pay

## 2017-09-28 NOTE — Progress Notes (Signed)
Paramedicine Encounter    Patient ID: Jonathan [name], male    DOB: [birth year], 32 y.o.   MRN: [id number]    Patient Care Team: Jonathan Party, DO as PCP - General (Internal Medicine)  Patient Active Problem List   Diagnosis Date Noted  . Charcot ankle, left 05/31/2017  . Acute pain of left foot 05/03/2017  . Diabetic polyneuropathy associated with type 2 diabetes mellitus ([place]) 03/14/2017  . OSA (obstructive sleep apnea) 12/02/2016  . Chronic systolic (congestive) heart failure ([place])   . Obesity, Class III, BMI 40-49.9 (morbid obesity) ([place]) 10/26/2016  . Asthma, chronic, mild persistent, uncomplicated 02/23/3233  . Essential hypertension 10/26/2016  . Diabetes mellitus type 2 in obese ([place]) 04/20/2016    Current Outpatient Medications:  .  amLODipine (NORVASC) 5 MG tablet, Take 1 tablet (5 mg total) by mouth daily., Disp: 30 tablet, Rfl: 11 .  carvedilol (COREG) 25 MG tablet, Take 0.5 tablets (12.5 mg total) by mouth 2 (two) times daily with a meal., Disp: 30 tablet, Rfl: 11 .  JANUVIA 100 MG tablet, TAKE 1 TABLET BY MOUTH EVERY DAY, Disp: 30 tablet, Rfl: 3 .  sacubitril-valsartan (ENTRESTO) 97-103 MG, Take 1 tablet by mouth 2 (two) times daily., Disp: 60 tablet, Rfl: 6 .  spironolactone (ALDACTONE) 25 MG tablet, Take 1 tablet (25 mg total) by mouth daily., Disp: 30 tablet, Rfl: 6 .  torsemide (DEMADEX) 20 MG tablet, Take 1 tablet (20 mg total) by mouth 2 (two) times daily., Disp: 180 tablet, Rfl: 3 .  Amino Acids (L-CARNITINE PO), Take 1 tablet daily by mouth., Disp: , Rfl:  .  blood glucose meter kit and supplies KIT, Dispense based on patient and insurance preference. Use up to four times daily as directed. (FOR ICD-9 250.00, 250.01). For QAC - HS accuchecks. May switch to any brand., Disp: 1 each, Rfl: 1 .  calcitonin, salmon, (MIACALCIN/FORTICAL) 200 UNIT/ACT nasal spray, PLACE 1 SPRAY INTO ALTERNATE NOSTRILS DAILY, Disp: 3.7 mL, Rfl: 0 .  gabapentin (NEURONTIN) 300 MG  capsule, Take 1 capsule (300 mg total) by mouth 3 (three) times daily., Disp: 90 capsule, Rfl: 3 .  glipiZIDE (GLUCOTROL XL) 2.5 MG 24 hr tablet, Take 2 tablets (5 mg total) by mouth daily with breakfast., Disp: 60 tablet, Rfl: 3 .  ibuprofen (ADVIL,MOTRIN) 800 MG tablet, Take 800 mg by mouth every 8 (eight) hours., Disp: , Rfl:  .  metolazone (ZAROXOLYN) 2.5 MG tablet, Take 1 tablet (2.5 mg total) by mouth as needed. As directed by HF clinic for weight gain of 3 lbs overnight or 5 lbs within one week., Disp: 10 tablet, Rfl: 2 .  tiotropium (SPIRIVA) 18 MCG inhalation capsule, Place 18 mcg daily into inhaler and inhale., Disp: , Rfl:  Allergies  Allergen Reactions  . Hydrocodone     Hives      Social History   Socioeconomic History  . Marital status: Married    Spouse name: Not on file  . Number of children: Not on file  . Years of education: Not on file  . Highest education level: Not on file  Occupational History  . Not on file  Social Needs  . Financial resource strain: Not on file  . Food insecurity:    Worry: Not on file    Inability: Not on file  . Transportation needs:    Medical: Not on file    Non-medical: Not on file  Tobacco Use  . Smoking status: Never Smoker  . Smokeless tobacco:  Never Used  Substance and Sexual Activity  . Alcohol use: No  . Drug use: No  . Sexual activity: Never  Lifestyle  . Physical activity:    Days per week: 0 days    Minutes per session: 0 min  . Stress: Very much  Relationships  . Social connections:    Talks on phone: Not on file    Gets together: Not on file    Attends religious service: Not on file    Active member of club or organization: Not on file    Attends meetings of clubs or organizations: Not on file    Relationship status: Not on file  . Intimate partner violence:    Fear of current or ex partner: Not on file    Emotionally abused: Not on file    Physically abused: Not on file    Forced sexual activity: Not on  file  Other Topics Concern  . Not on file  Social History Narrative  . Not on file    Physical Exam      No future appointments.   BP 104/78 (BP Location: Left Arm, Patient Position: Sitting, Cuff Size: Large)   Pulse 97   Resp 16   Wt (!) 381 lb 6.4 oz (173 kg)   SpO2 99%   BMI 48.97 kg/m   CBG 268  Weight yesterday-386   ATF pt CAO x4 sitiing at table with no complaints. Pt fills his own pill box and advises that he is out of several medications but his wife will pick them up before she leaves work.  Today is our first visit, Jonathan [name] was visiting him prior to today.  Pt denies sob, chest pain and dizziness.  He stated that he usually eat pretty well, but later admitted to eating out a lot. Pt also stated that he does not check his blood sugar due to him being out of test strips.  Pt also stated that he takes torsemide every other day due to abdominal pain (knots in his abdomen when he takes it daily).  Pt and I discussed his current frequency with checking his CBG and taking torsemide, however he insist of taking the torsemide this way.  ** need Contour glucometer/need strips **Doesn't take glizide any more due to abdominal pain (dr.Jessica  [name] prescribed) Medication ordered: januvia entresto [place] Amlodipine  Jonathan [name], EMT Paramedic [phone] 09/28/2017    ACTION: Home visit completed

## 2017-10-06 ENCOUNTER — Encounter: Payer: Self-pay | Admitting: Internal Medicine

## 2017-10-06 ENCOUNTER — Other Ambulatory Visit: Payer: Self-pay

## 2017-10-06 ENCOUNTER — Ambulatory Visit: Payer: BLUE CROSS/BLUE SHIELD | Admitting: Internal Medicine

## 2017-10-06 VITALS — BP 127/53 | HR 107 | Temp 98.2°F | Ht 74.0 in | Wt 383.6 lb

## 2017-10-06 DIAGNOSIS — Z7984 Long term (current) use of oral hypoglycemic drugs: Secondary | ICD-10-CM

## 2017-10-06 DIAGNOSIS — E669 Obesity, unspecified: Secondary | ICD-10-CM | POA: Diagnosis not present

## 2017-10-06 DIAGNOSIS — Z9114 Patient's other noncompliance with medication regimen: Secondary | ICD-10-CM

## 2017-10-06 DIAGNOSIS — E11610 Type 2 diabetes mellitus with diabetic neuropathic arthropathy: Secondary | ICD-10-CM | POA: Diagnosis not present

## 2017-10-06 DIAGNOSIS — E1169 Type 2 diabetes mellitus with other specified complication: Secondary | ICD-10-CM | POA: Diagnosis not present

## 2017-10-06 DIAGNOSIS — I5022 Chronic systolic (congestive) heart failure: Secondary | ICD-10-CM | POA: Diagnosis not present

## 2017-10-06 DIAGNOSIS — Z6841 Body Mass Index (BMI) 40.0 and over, adult: Secondary | ICD-10-CM

## 2017-10-06 LAB — POCT GLYCOSYLATED HEMOGLOBIN (HGB A1C): Hemoglobin A1C: 10.9 % — AB (ref 4.0–5.6)

## 2017-10-06 LAB — BASIC METABOLIC PANEL
Anion gap: 12 (ref 5–15)
BUN: 28 mg/dL — ABNORMAL HIGH (ref 6–20)
CO2: 28 mmol/L (ref 22–32)
Calcium: 10.3 mg/dL (ref 8.9–10.3)
Chloride: 93 mmol/L — ABNORMAL LOW (ref 98–111)
Creatinine, Ser: 1.02 mg/dL (ref 0.61–1.24)
GFR calc Af Amer: >60 mL/min (ref >60)
GFR calc non Af Amer: >60 mL/min (ref >60)
Glucose, Bld: 384 mg/dL — ABNORMAL HIGH (ref 70–99)
Potassium: 4 mmol/L (ref 3.5–5.1)
Sodium: 133 mmol/L — ABNORMAL LOW (ref 135–145)

## 2017-10-06 LAB — GLUCOSE, CAPILLARY: Glucose-Capillary: 418 mg/dL — ABNORMAL HIGH (ref 70–99)

## 2017-10-06 MED ORDER — EMPAGLIFLOZIN 10 MG PO TABS: 10.0000 mg | ORAL_TABLET | Freq: Every day | ORAL | 1 refills | Status: DC

## 2017-10-06 NOTE — Progress Notes (Signed)
   CC: Diabetes follow up   HPI:  [name] is a 32 y.o.   T2DM: Jonathan [name] was last seen in clinic on 04/2017 at which time he was on Januvia and Glipizide.  Glipizide was discontinued due to GI side effects.  His A1c was 7.6 from 13.5 in 11/2016. He presents today for follow up and states he has been out of his medications for 3 days , but was able to obtain them today.  A1c 10.9 today with CBG 418.  Denies headache, changes in mental status, polydipsia, and polyuria.  Does endorse increased thirst. Appears hypovolemic on exam. Suspect this marked elevation in A1c is due to medication non-adherence and weight gain as he is 30 lbs up since last seen. He also has been wearing a special boot for L charcot foot that was taken off a 2 days ago, which has limit his mobility. Will add an SGLT2-inhibitor to his regimen given his history of HF. He has also been on one in the past (when living in [place]) and responded well to it. No previous history of HHs, DKA, or LE wounds. Declined insulin or any other injectable medications.  - BMP without acidosis  - Start Jardiance 10 mg QD  - Continue Januvia, but recommend talking to cardiology as it has been associated with worsening outcomes in patients with HF in the past  - Follow up in 1 month   Past Medical History:  Diagnosis Date  . Acute pain of left foot 05/03/2017  . Asthma   . Asthma, chronic, mild persistent, uncomplicated 10/26/2016  . Charcot ankle, left 05/31/2017  . CHF (congestive heart failure) (HCC)   . Chronic systolic (congestive) heart failure (HCC)   . Diabetes mellitus type 2 in obese (HCC) 04/20/2016  . Diabetic polyneuropathy associated with type 2 diabetes mellitus (HCC) 03/14/2017  . Essential hypertension 10/26/2016  . Hypertension   . Obesity   . Obesity, Class III, BMI 40-49.9 (morbid obesity) (H[facility]) 10/26/2016  . OSA (obstructive sleep apnea) 12/02/2016  . Type 2 diabetes mellitus with diabetic neuropathy (HCC)     Review of Systems:   Review of Systems  Constitutional: Negative for chills, fever and weight loss.       Weight gain   Eyes: Negative for blurred vision.  Respiratory: Negative for shortness of breath.   Cardiovascular: Negative for chest pain, palpitations and leg swelling.  Genitourinary: Negative for frequency and urgency.  Endo/Heme/Allergies: Negative for polydipsia.       Increased thirst    Physical Exam: Vitals:   10/06/17 0832  BP: (!) 127/53  Pulse: (!) 107  Temp: 98.2 F (36.8 C)  TempSrc: Oral  SpO2: 96%  Weight: (!) 383 lb 9.6 oz (174 kg)  Height: 6\' 2"  (1.88 m)   General: Obese male, well-developed, in no acute distress Mouth: dry MM CV: RRR, normal S1/S2, no MRG Pulm: CTA bilaterally without wheezes or crackles, no increased WOB Ext: Warm and well-perfused without cyanosis or edema, L charcot foot   Assessment & Plan:   See Encounters Tab for problem based charting.  Patient discussed with Dr. Sandre Kitty

## 2017-10-06 NOTE — Assessment & Plan Note (Signed)
T2DM: Jonathan [name] was last seen in clinic on 04/2017 at which time he was on Januvia and Glipizide.  Glipizide was discontinued due to GI side effects.  His A1c was 7.6 from 13.5 in 11/2016. He presents today for follow up and states he has been out of his medications for 3 days , but was able to obtain them today.  A1c 10.9 today with CBG 418.  Denies headache, changes in mental status, polydipsia, and polyuria.  Does endorse increased thirst. Appears hypovolemic on exam. Suspect this marked elevation in A1c is due to medication non-adherence and weight gain as he is 30 lbs up since last seen. He also has been wearing a special boot for L charcot foot that was taken off a 2 days ago, which has limit his mobility. Will add an SGLT2-inhibitor to his regimen given his history of HF. He has also been on one in the past (when living in [place]) and responded well to it. No previous history of HHs, DKA, or LE wounds. Declined insulin or any other injectable medications.  - BMP without acidosis  - Start Jardiance 10 mg QD  - Continue Januvia, but recommend talking to cardiology as it has been associated with worsening outcomes in patients with HF in the past  - Follow up in 1 month

## 2017-10-06 NOTE — Patient Instructions (Signed)
Ms. Eischens,   I sent a prescription for Jardiance to yoru pharmacy. Please take 1 tablet of 10 mg every day. This should be affordable ($0 copay) with the coupon we spoke about. We will also look into the price of the test strips for your blood sugar meter.   I will call your cardiologist about Januvia. In the meantime continue taking it.   Please make a follow up appt with [place] in 1 month.   Please call us if you have any questions or concerns.   - Dr. Evelene Croon

## 2017-10-07 NOTE — Progress Notes (Signed)
Internal Medicine Clinic Attending  Case discussed with Dr. Santos-Sanchez at the time of the visit.  We reviewed the resident's history and exam and pertinent patient test results.  I agree with the assessment, diagnosis, and plan of care documented in the resident's note.  Alexander Raines, M.D., Ph.D.  

## 2017-10-12 ENCOUNTER — Ambulatory Visit (INDEPENDENT_AMBULATORY_CARE_PROVIDER_SITE_OTHER): Payer: BLUE CROSS/BLUE SHIELD | Admitting: Orthotics

## 2017-10-12 DIAGNOSIS — M21072 Valgus deformity, not elsewhere classified, left ankle: Secondary | ICD-10-CM

## 2017-10-12 DIAGNOSIS — E11610 Type 2 diabetes mellitus with diabetic neuropathic arthropathy: Secondary | ICD-10-CM

## 2017-10-12 DIAGNOSIS — M14672 Charcot's joint, left ankle and foot: Secondary | ICD-10-CM

## 2017-10-12 DIAGNOSIS — E1169 Type 2 diabetes mellitus with other specified complication: Secondary | ICD-10-CM

## 2017-10-12 DIAGNOSIS — M79605 Pain in left leg: Secondary | ICD-10-CM

## 2017-10-12 DIAGNOSIS — E669 Obesity, unspecified: Secondary | ICD-10-CM

## 2017-10-12 DIAGNOSIS — M21071 Valgus deformity, not elsewhere classified, right ankle: Secondary | ICD-10-CM

## 2017-10-12 NOTE — Progress Notes (Signed)
Patient came in today to pick up standard Afo brace.  Patient was evaluated for fit and function.   The brace fit very well and there were any complaints of the way it felt once donned.  The brace offered ankle stability in both saggital and coroneal planes.  Patient advised to always wear proper fitting shoes with brace. 

## 2017-10-15 NOTE — Progress Notes (Signed)
  Subjective:  Patient ID: Jonathan [name], male    DOB: 08-Jul-1985,  MRN: [id number]  Chief Complaint  Patient presents with  . Diabetes    1 month follow up Charcot's disease   32 y.o. male presents with the above complaint  Doing ok still has not gotten his brace yet.  Objective:  There were no vitals filed for this visit. General AA&O x3. Normal mood and affect.  Vascular Dorsalis pedis and posterior tibial pulses  present 2+ bilaterally  Capillary refill normal to all digits. Pedal hair growth normal.  Neurologic Epicritic sensation grossly present.  Dermatologic No open lesions. Interspaces clear of maceration. Nails well groomed and normal in appearance.  Orthopedic: MMT 5/5 in dorsiflexion, plantarflexion, inversion, and eversion. Pes planus bilateral with pain, no increased warmth; some swelling bilat noted. Ankle valgus bilat.   Assessment & Plan:  Patient was evaluated and treated and all questions answered.  Charcot process foot -Appears resolved. -Transition out of boot.  Severe Pes Planus bilat -Awaiting ankle brace  F/u after brace dispensing.  No follow-ups on file.

## 2017-10-25 ENCOUNTER — Other Ambulatory Visit: Payer: Self-pay | Admitting: Internal Medicine

## 2017-10-27 ENCOUNTER — Telehealth (HOSPITAL_COMMUNITY): Payer: Self-pay

## 2017-10-28 ENCOUNTER — Other Ambulatory Visit (HOSPITAL_COMMUNITY): Payer: Self-pay

## 2017-10-28 NOTE — Telephone Encounter (Signed)
Pt called and asked to reschedule the [facility] appointment for tomorrow.

## 2017-10-28 NOTE — Progress Notes (Signed)
Paramedicine Encounter    Patient ID: Jonathan [name], male    DOB: [birth year], 32 y.o.   MRN: [id number]    Patient Care Team: Valinda Party, DO as PCP - General (Internal Medicine)  Patient Active Problem List   Diagnosis Date Noted  . Charcot ankle, left 05/31/2017  . Acute pain of left foot 05/03/2017  . Diabetic polyneuropathy associated with type 2 diabetes mellitus ([place]) 03/14/2017  . OSA (obstructive sleep apnea) 12/02/2016  . Chronic systolic (congestive) heart failure ([place])   . Obesity, Class III, BMI 40-49.9 (morbid obesity) ([place]) 10/26/2016  . Asthma, chronic, mild persistent, uncomplicated 63/87/5643  . Essential hypertension 10/26/2016  . Diabetes mellitus type 2 in obese ([place]) 04/20/2016    Current Outpatient Medications:  .  Amino Acids (L-CARNITINE PO), Take 1 tablet daily by mouth., Disp: , Rfl:  .  amLODipine (NORVASC) 5 MG tablet, Take 1 tablet (5 mg total) by mouth daily., Disp: 30 tablet, Rfl: 11 .  blood glucose meter kit and supplies KIT, Dispense based on patient and insurance preference. Use up to four times daily as directed. (FOR ICD-9 250.00, 250.01). For QAC - HS accuchecks. May switch to any brand., Disp: 1 each, Rfl: 1 .  calcitonin, salmon, (MIACALCIN/FORTICAL) 200 UNIT/ACT nasal spray, PLACE 1 SPRAY INTO ALTERNATE NOSTRILS DAILY, Disp: 3.7 mL, Rfl: 0 .  carvedilol (COREG) 25 MG tablet, Take 0.5 tablets (12.5 mg total) by mouth 2 (two) times daily with a meal., Disp: 30 tablet, Rfl: 11 .  empagliflozin (JARDIANCE) 10 MG TABS tablet, Take 10 mg by mouth daily., Disp: 30 tablet, Rfl: 1 .  gabapentin (NEURONTIN) 300 MG capsule, Take 1 capsule (300 mg total) by mouth 3 (three) times daily., Disp: 90 capsule, Rfl: 3 .  ibuprofen (ADVIL,MOTRIN) 800 MG tablet, Take 800 mg by mouth every 8 (eight) hours., Disp: , Rfl:  .  JANUVIA 100 MG tablet, TAKE 1 TABLET BY MOUTH EVERY DAY, Disp: 30 tablet, Rfl: 3 .  metolazone (ZAROXOLYN) 2.5 MG tablet, Take 1  tablet (2.5 mg total) by mouth as needed. As directed by HF clinic for weight gain of 3 lbs overnight or 5 lbs within one week., Disp: 10 tablet, Rfl: 2 .  sacubitril-valsartan (ENTRESTO) 97-103 MG, Take 1 tablet by mouth 2 (two) times daily., Disp: 60 tablet, Rfl: 6 .  spironolactone (ALDACTONE) 25 MG tablet, Take 1 tablet (25 mg total) by mouth daily., Disp: 30 tablet, Rfl: 6 .  tiotropium (SPIRIVA) 18 MCG inhalation capsule, Place 18 mcg daily into inhaler and inhale., Disp: , Rfl:  .  torsemide (DEMADEX) 20 MG tablet, Take 1 tablet (20 mg total) by mouth 2 (two) times daily., Disp: 180 tablet, Rfl: 3 Allergies  Allergen Reactions  . Hydrocodone     Hives      Social History   Socioeconomic History  . Marital status: Married    Spouse name: Not on file  . Number of children: Not on file  . Years of education: Not on file  . Highest education level: Not on file  Occupational History  . Not on file  Social Needs  . Financial resource strain: Not on file  . Food insecurity:    Worry: Not on file    Inability: Not on file  . Transportation needs:    Medical: Not on file    Non-medical: Not on file  Tobacco Use  . Smoking status: Never Smoker  . Smokeless tobacco: Never Used  Substance and Sexual Activity  .  Alcohol use: No  . Drug use: No  . Sexual activity: Never  Lifestyle  . Physical activity:    Days per week: 0 days    Minutes per session: 0 min  . Stress: Very much  Relationships  . Social connections:    Talks on phone: Not on file    Gets together: Not on file    Attends religious service: Not on file    Active member of club or organization: Not on file    Attends meetings of clubs or organizations: Not on file    Relationship status: Not on file  . Intimate partner violence:    Fear of current or ex partner: Not on file    Emotionally abused: Not on file    Physically abused: Not on file    Forced sexual activity: Not on file  Other Topics Concern  .  Not on file  Social History Narrative  . Not on file    Physical Exam      Future Appointments  Date Time Provider [place]  11/03/2017  1:45 PM Valinda Party, DO [facility]  11/04/2017 10:15 AM March Rummage, Christian Mate, DPM [facility]     BP 110/82 (BP Location: Left Arm, Patient Position: Sitting, Cuff Size: Large)   Pulse 88   Resp 16   Wt (!) 384 lb 12.8 oz (174.5 kg)   SpO2 98%   BMI 49.41 kg/m  cbg 202  Weight yesterday-383 Last visit weight-381  ATF pt CAO x4 sitting at the table in the living room watching tv.  He stated that he feels a lot better since the pcp changed the diabetes medication.  He stated that his blood sugars has been a lot better.  He stated that he has been taking his medications.  He wouldn't allow me to refill his pill box and didn't go into his room to get the bottles.  Pt denies sob, chest pain and dizziness.   He still doesn't have the strips for his glucometer.    Medication ordered: Pt orders his own meds [place], EMT Paramedic [phone] 10/28/2017    ACTION: Home visit completed

## 2017-11-02 ENCOUNTER — Other Ambulatory Visit (HOSPITAL_COMMUNITY): Payer: Self-pay

## 2017-11-02 NOTE — Progress Notes (Signed)
I picked up pt's medications from [facility] [address]; delivered his meds to his home. Four medications were picked up.

## 2017-11-02 NOTE — Progress Notes (Signed)
Paramedicine Encounter    Patient ID: Jonathan [name], male    DOB: [birth year], 32 y.o.   MRN: [id number]    Patient Care Team: Valinda Party, DO as PCP - General (Internal Medicine)  Patient Active Problem List   Diagnosis Date Noted  . Charcot ankle, left 05/31/2017  . Acute pain of left foot 05/03/2017  . Diabetic polyneuropathy associated with type 2 diabetes mellitus ([place]) 03/14/2017  . OSA (obstructive sleep apnea) 12/02/2016  . Chronic systolic (congestive) heart failure ([place])   . Obesity, Class III, BMI 40-49.9 (morbid obesity) ([place]) 10/26/2016  . Asthma, chronic, mild persistent, uncomplicated 63/87/5643  . Essential hypertension 10/26/2016  . Diabetes mellitus type 2 in obese ([place]) 04/20/2016    Current Outpatient Medications:  .  Amino Acids (L-CARNITINE PO), Take 1 tablet daily by mouth., Disp: , Rfl:  .  amLODipine (NORVASC) 5 MG tablet, Take 1 tablet (5 mg total) by mouth daily., Disp: 30 tablet, Rfl: 11 .  blood glucose meter kit and supplies KIT, Dispense based on patient and insurance preference. Use up to four times daily as directed. (FOR ICD-9 250.00, 250.01). For QAC - HS accuchecks. May switch to any brand., Disp: 1 each, Rfl: 1 .  calcitonin, salmon, (MIACALCIN/FORTICAL) 200 UNIT/ACT nasal spray, PLACE 1 SPRAY INTO ALTERNATE NOSTRILS DAILY, Disp: 3.7 mL, Rfl: 0 .  carvedilol (COREG) 25 MG tablet, Take 0.5 tablets (12.5 mg total) by mouth 2 (two) times daily with a meal., Disp: 30 tablet, Rfl: 11 .  empagliflozin (JARDIANCE) 10 MG TABS tablet, Take 10 mg by mouth daily., Disp: 30 tablet, Rfl: 1 .  gabapentin (NEURONTIN) 300 MG capsule, Take 1 capsule (300 mg total) by mouth 3 (three) times daily., Disp: 90 capsule, Rfl: 3 .  ibuprofen (ADVIL,MOTRIN) 800 MG tablet, Take 800 mg by mouth every 8 (eight) hours., Disp: , Rfl:  .  JANUVIA 100 MG tablet, TAKE 1 TABLET BY MOUTH EVERY DAY, Disp: 30 tablet, Rfl: 3 .  metolazone (ZAROXOLYN) 2.5 MG tablet, Take 1  tablet (2.5 mg total) by mouth as needed. As directed by HF clinic for weight gain of 3 lbs overnight or 5 lbs within one week., Disp: 10 tablet, Rfl: 2 .  sacubitril-valsartan (ENTRESTO) 97-103 MG, Take 1 tablet by mouth 2 (two) times daily., Disp: 60 tablet, Rfl: 6 .  spironolactone (ALDACTONE) 25 MG tablet, Take 1 tablet (25 mg total) by mouth daily., Disp: 30 tablet, Rfl: 6 .  tiotropium (SPIRIVA) 18 MCG inhalation capsule, Place 18 mcg daily into inhaler and inhale., Disp: , Rfl:  .  torsemide (DEMADEX) 20 MG tablet, Take 1 tablet (20 mg total) by mouth 2 (two) times daily., Disp: 180 tablet, Rfl: 3 Allergies  Allergen Reactions  . Hydrocodone     Hives      Social History   Socioeconomic History  . Marital status: Married    Spouse name: Not on file  . Number of children: Not on file  . Years of education: Not on file  . Highest education level: Not on file  Occupational History  . Not on file  Social Needs  . Financial resource strain: Not on file  . Food insecurity:    Worry: Not on file    Inability: Not on file  . Transportation needs:    Medical: Not on file    Non-medical: Not on file  Tobacco Use  . Smoking status: Never Smoker  . Smokeless tobacco: Never Used  Substance and Sexual Activity  .  Alcohol use: No  . Drug use: No  . Sexual activity: Never  Lifestyle  . Physical activity:    Days per week: 0 days    Minutes per session: 0 min  . Stress: Very much  Relationships  . Social connections:    Talks on phone: Not on file    Gets together: Not on file    Attends religious service: Not on file    Active member of club or organization: Not on file    Attends meetings of clubs or organizations: Not on file    Relationship status: Not on file  . Intimate partner violence:    Fear of current or ex partner: Not on file    Emotionally abused: Not on file    Physically abused: Not on file    Forced sexual activity: Not on file  Other Topics Concern  .  Not on file  Social History Narrative  . Not on file    Physical Exam  Pulmonary/Chest: Effort normal. No respiratory distress. He has no wheezes.  Abdominal: Soft. He exhibits no distension.  Musculoskeletal: He exhibits no edema.  Skin: Skin is warm and dry. He is not diaphoretic.        Future Appointments  Date Time Provider [place]  11/03/2017  1:45 PM Valinda Party, DO [facility]  11/04/2017 10:15 AM March Rummage, Christian Mate, DPM [facility]     BP 100/70 (BP Location: Left Arm, Patient Position: Sitting, Cuff Size: Large)   Pulse 90   Resp 16   Wt (!) 387 lb 12.8 oz (175.9 kg)   SpO2 97%   BMI 49.79 kg/m   Weight yesterday-weighed but doesn't remember wt Last visit weight-384  ATF pt CAOn x4 sitting at his desk working on the computer. He stated that he's "tired because he's not sleeping well".  He denies sob, chest pain and dizziness.  Pt stated that he is out of meds that were supposed to be picked up last week.  Pt's vitals noted.  He's still unable to check his blood sugar because he's still out of test strips.  Pt's vitals noted.  rx bottles and pill box verified.   appt with dr. Heber Red Bud (internal medicine [phone]) tomorrow.  Medication ordered: [facility] [address] entresto [phone] Amlodipine [employer] [place], EMT Paramedic [phone] 11/02/2017    ACTION: Home visit completed

## 2017-11-03 ENCOUNTER — Encounter: Payer: Self-pay | Admitting: Internal Medicine

## 2017-11-03 ENCOUNTER — Ambulatory Visit: Payer: BLUE CROSS/BLUE SHIELD | Admitting: Internal Medicine

## 2017-11-03 ENCOUNTER — Other Ambulatory Visit: Payer: Self-pay

## 2017-11-03 DIAGNOSIS — F329 Major depressive disorder, single episode, unspecified: Secondary | ICD-10-CM

## 2017-11-03 DIAGNOSIS — Z7984 Long term (current) use of oral hypoglycemic drugs: Secondary | ICD-10-CM

## 2017-11-03 DIAGNOSIS — F32A Depression, unspecified: Secondary | ICD-10-CM

## 2017-11-03 DIAGNOSIS — E669 Obesity, unspecified: Secondary | ICD-10-CM

## 2017-11-03 DIAGNOSIS — E118 Type 2 diabetes mellitus with unspecified complications: Secondary | ICD-10-CM

## 2017-11-03 DIAGNOSIS — M79676 Pain in unspecified toe(s): Secondary | ICD-10-CM

## 2017-11-03 DIAGNOSIS — F410 Panic disorder [episodic paroxysmal anxiety] without agoraphobia: Secondary | ICD-10-CM

## 2017-11-03 DIAGNOSIS — E1169 Type 2 diabetes mellitus with other specified complication: Secondary | ICD-10-CM

## 2017-11-03 DIAGNOSIS — Z6841 Body Mass Index (BMI) 40.0 and over, adult: Secondary | ICD-10-CM

## 2017-11-03 LAB — GLUCOSE, CAPILLARY: Glucose-Capillary: 181 mg/dL — ABNORMAL HIGH (ref 70–99)

## 2017-11-03 MED ORDER — GABAPENTIN 300 MG PO CAPS: 300.0000 mg | ORAL_CAPSULE | Freq: Three times a day (TID) | ORAL | 3 refills | Status: DC

## 2017-11-03 MED ORDER — SERTRALINE HCL 25 MG PO TABS: 25.0000 mg | ORAL_TABLET | Freq: Every day | ORAL | 2 refills | Status: DC

## 2017-11-03 MED ORDER — EMPAGLIFLOZIN 25 MG PO TABS: 25.0000 mg | ORAL_TABLET | Freq: Every day | ORAL | 2 refills | Status: DC

## 2017-11-03 NOTE — Patient Instructions (Signed)
Jonathan [name],  It was a pleasure seeing you today. I've increased your Jardiance to 25 mg daily. This will be seen on your next prescription.  I have also prescribed a low-dose antidepressant called Zoloft 25 mg daily. Referral for therapist has been made. Please follow up in 2 months.

## 2017-11-03 NOTE — Progress Notes (Signed)
   CC: Follow-up on type 2 diabetes  HPI:  [name] is a 32 y.o. male with history noted below the presents to the internal medicine clinic for follow-up on type 2 diabetes.  Please see problem based charting for the status of patient's chronic medical conditions.  Past Medical History:  Diagnosis Date  . Acute pain of left foot 05/03/2017  . Asthma   . Asthma, chronic, mild persistent, uncomplicated 10/26/2016  . Charcot ankle, left 05/31/2017  . CHF (congestive heart failure) (HCC)   . Chronic systolic (congestive) heart failure (HCC)   . Diabetes mellitus type 2 in obese (HCC) 04/20/2016  . Diabetic polyneuropathy associated with type 2 diabetes mellitus (HCC) 03/14/2017  . Essential hypertension 10/26/2016  . Hypertension   . Obesity   . Obesity, Class III, BMI 40-49.9 (morbid obesity) (HCC) 10/26/2016  . OSA (obstructive sleep apnea) 12/02/2016  . Type 2 diabetes mellitus with diabetic neuropathy (HCC)     Review of Systems:  Review of Systems  Respiratory: Negative for shortness of breath.   Cardiovascular: Negative for chest pain.  Psychiatric/Behavioral: Positive for depression. Negative for suicidal ideas.      Physical Exam:  Vitals:   11/03/17 1342  BP: 112/84  Pulse: 93  Temp: 98.4 F (36.9 C)  TempSrc: Oral  SpO2: 98%  Weight: (!) 388 lb 8 oz (176.2 kg)   Physical Exam  Cardiovascular: Normal rate, regular rhythm and normal heart sounds. Exam reveals no gallop and no friction rub.  No murmur heard. Pulmonary/Chest: Effort normal and breath sounds normal. No respiratory distress. He has no wheezes. He has no rales.     Assessment & Plan:   See encounters tab for problem based medical decision making.    Patient discussed with Dr. Heide Spark

## 2017-11-04 ENCOUNTER — Ambulatory Visit: Payer: BLUE CROSS/BLUE SHIELD | Admitting: Podiatry

## 2017-11-04 DIAGNOSIS — E1142 Type 2 diabetes mellitus with diabetic polyneuropathy: Secondary | ICD-10-CM

## 2017-11-04 DIAGNOSIS — M21072 Valgus deformity, not elsewhere classified, left ankle: Secondary | ICD-10-CM

## 2017-11-04 DIAGNOSIS — M21071 Valgus deformity, not elsewhere classified, right ankle: Secondary | ICD-10-CM

## 2017-11-04 DIAGNOSIS — E11610 Type 2 diabetes mellitus with diabetic neuropathic arthropathy: Secondary | ICD-10-CM

## 2017-11-04 DIAGNOSIS — B351 Tinea unguium: Secondary | ICD-10-CM | POA: Diagnosis not present

## 2017-11-04 MED ORDER — KETOCONAZOLE 2 % EX CREA: TOPICAL_CREAM | CUTANEOUS | 0 refills | Status: DC

## 2017-11-04 NOTE — Progress Notes (Signed)
Subjective:  Patient ID: Jonathan [name], male    DOB: 07/16/1985,  MRN: [id number]  Chief Complaint  Patient presents with  . Diabetes    3 week follow up left ankle brace  . Nail Problem    nail trim    32 y.o. male presents with the above complaint.  Thinks the brace is helping.  Thinks it might have been more weight on the right foot now however.  Also requesting care of his nails today.  Review of Systems: Negative except as noted in the HPI. Denies N/V/F/Ch.  Past Medical History:  Diagnosis Date  . Acute pain of left foot 05/03/2017  . Asthma   . Asthma, chronic, mild persistent, uncomplicated 10/26/2016  . Charcot ankle, left 05/31/2017  . CHF (congestive heart failure) (HCC)   . Chronic systolic (congestive) heart failure (HCC)   . Diabetes mellitus type 2 in obese (HCC) 04/20/2016  . Diabetic polyneuropathy associated with type 2 diabetes mellitus (HCC) 03/14/2017  . Essential hypertension 10/26/2016  . Hypertension   . Obesity   . Obesity, Class III, BMI 40-49.9 (morbid obesity) (HCC) 10/26/2016  . OSA (obstructive sleep apnea) 12/02/2016  . Type 2 diabetes mellitus with diabetic neuropathy (HCC)     Current Outpatient Medications:  .  Amino Acids (L-CARNITINE PO), Take 1 tablet daily by mouth., Disp: , Rfl:  .  amLODipine (NORVASC) 5 MG tablet, Take 1 tablet (5 mg total) by mouth daily., Disp: 30 tablet, Rfl: 11 .  blood glucose meter kit and supplies KIT, Dispense based on patient and insurance preference. Use up to four times daily as directed. (FOR ICD-9 250.00, 250.01). For QAC - HS accuchecks. May switch to any brand., Disp: 1 each, Rfl: 1 .  calcitonin, salmon, (MIACALCIN/FORTICAL) 200 UNIT/ACT nasal spray, PLACE 1 SPRAY INTO ALTERNATE NOSTRILS DAILY, Disp: 3.7 mL, Rfl: 0 .  carvedilol (COREG) 25 MG tablet, Take 0.5 tablets (12.5 mg total) by mouth 2 (two) times daily with a meal., Disp: 30 tablet, Rfl: 11 .  empagliflozin (JARDIANCE) 25 MG TABS tablet, Take 25 mg by  mouth daily., Disp: 30 tablet, Rfl: 2 .  gabapentin (NEURONTIN) 300 MG capsule, Take 1 capsule (300 mg total) by mouth 3 (three) times daily., Disp: 90 capsule, Rfl: 3 .  JANUVIA 100 MG tablet, TAKE 1 TABLET BY MOUTH EVERY DAY, Disp: 30 tablet, Rfl: 3 .  ketoconazole (NIZORAL) 2 % cream, Apply 1 fingertip amount to each foot daily., Disp: 30 g, Rfl: 0 .  metolazone (ZAROXOLYN) 2.5 MG tablet, Take 1 tablet (2.5 mg total) by mouth as needed. As directed by HF clinic for weight gain of 3 lbs overnight or 5 lbs within one week., Disp: 10 tablet, Rfl: 2 .  sacubitril-valsartan (ENTRESTO) 97-103 MG, Take 1 tablet by mouth 2 (two) times daily., Disp: 60 tablet, Rfl: 6 .  sertraline (ZOLOFT) 25 MG tablet, Take 1 tablet (25 mg total) by mouth daily., Disp: 30 tablet, Rfl: 2 .  spironolactone (ALDACTONE) 25 MG tablet, Take 1 tablet (25 mg total) by mouth daily., Disp: 30 tablet, Rfl: 6 .  tiotropium (SPIRIVA) 18 MCG inhalation capsule, Place 18 mcg daily into inhaler and inhale., Disp: , Rfl:  .  torsemide (DEMADEX) 20 MG tablet, Take 1 tablet (20 mg total) by mouth 2 (two) times daily., Disp: 180 tablet, Rfl: 3  Social History   Tobacco Use  Smoking Status Never Smoker  Smokeless Tobacco Never Used    Allergies  Allergen Reactions  . Hydrocodone       Hives    Objective:  There were no vitals filed for this visit. There is no height or weight on file to calculate BMI. Constitutional Well developed. Well nourished.  Vascular Dorsalis pedis pulses palpable bilaterally. Posterior tibial pulses palpable bilaterally. Capillary refill normal to all digits.  No cyanosis or clubbing noted. Pedal hair growth normal.  Neurologic Normal speech. Oriented to person, place, and time. Epicritic sensation to light touch grossly absent bilaterally.  Dermatologic Nails elongated thickened dystrophic no open wounds. No skin lesions.  Orthopedic: Normal joint ROM without pain or crepitus bilaterally. Severe  ankle valgus bilateral with severe pes planus No bony tenderness.   Radiographs: None today Assessment:   1. Acquired valgus deformity of left ankle   2. Acquired valgus deformity of right ankle   3. Diabetic Charcot's joint disease (HCC)    Plan:  Patient was evaluated and treated and all questions answered.  Ankle valgus, pes planus -Continue able to left ankle.  Patient may benefit from AFO to the right side as well.  Diabetes with DON, Onychomycosis -Educated on diabetic footcare. Diabetic risk level 1 -Nails x10 debrided sharply and manually with large nail nipper and rotary burr.   Procedure: Nail Debridement Rationale: Patient meets criteria for routine foot care due to PAD Type of Debridement: manual, sharp debridement. Instrumentation: Nail nipper, rotary burr. Number of Nails: 10  Return in about 8 weeks (around 12/30/2017) for Ankle brace f/u. 

## 2017-11-06 DIAGNOSIS — F32A Depression, unspecified: Secondary | ICD-10-CM | POA: Insufficient documentation

## 2017-11-06 DIAGNOSIS — F329 Major depressive disorder, single episode, unspecified: Secondary | ICD-10-CM | POA: Insufficient documentation

## 2017-11-06 NOTE — Assessment & Plan Note (Signed)
Assessment: Uncontrolled type II diabetes  Last hemoglobin A1c was 10.9 on 10/06/2017.  Previously patient was controlled on Januvia and glipizide however glipizide was discontinued due to GI side effects.  Patient was started on Jardiance 10 mg 1 month ago.  And is currently taking Januvia and Jardiance daily.  Patient has been on metformin but has discontinued it also due to side effects.  At this time will increase Jardiance to 25 mg daily.  This is a difficult case since patient declines the use of insulin or any injectable medications and has declined using glipizide and metformin.  Plan Increase jardiance to 25 mg daily Continue Januvia Follow up in 2 months

## 2017-11-06 NOTE — Assessment & Plan Note (Signed)
Assessment:  Depression Phq-9 was 16 today, patient scored highest on altered sleeping and change in appetite.  Denied suicidal ideation.  Patient states he would like a therapy dog as he often has panic attacks.  At this time will refer to psychology and start Zoloft.  Plan -Referral to psychology - Zoloft 25 mg daily

## 2017-11-07 NOTE — Progress Notes (Signed)
Internal Medicine Clinic Attending  Case discussed with Dr. Hoffman at the time of the visit.  We reviewed the resident's history and exam and pertinent patient test results.  I agree with the assessment, diagnosis, and plan of care documented in the resident's note.  

## 2017-11-11 ENCOUNTER — Telehealth (HOSPITAL_COMMUNITY): Payer: Self-pay

## 2017-11-11 NOTE — Telephone Encounter (Signed)
Patient was contacted to confirm appointment and patient stated he was on his way to the dentist due to tooth pain. Appointment rescheduled for next week.

## 2017-11-11 NOTE — Telephone Encounter (Signed)
Called to confirm visit, patient asked for a reschedule at 330 due to him not feeling well. There for appointment rescheduled.

## 2017-11-18 ENCOUNTER — Ambulatory Visit: Payer: BLUE CROSS/BLUE SHIELD | Admitting: Psychology

## 2017-11-22 ENCOUNTER — Telehealth (HOSPITAL_COMMUNITY): Payer: Self-pay

## 2017-11-22 NOTE — Telephone Encounter (Signed)
I called Jonathan [name] to remind him of the [facility] scheduled for tomorrow. He did not answer so I left a voicemail with the information.

## 2017-11-22 NOTE — Telephone Encounter (Signed)
Jonathan [name] returned my call to let me know that he is not interested in coming to the [facility]. I encouraged him to come if he has a change or heart and let him know the time and place.

## 2017-11-23 ENCOUNTER — Telehealth (HOSPITAL_COMMUNITY): Payer: Self-pay

## 2017-11-24 ENCOUNTER — Encounter (HOSPITAL_COMMUNITY): Payer: Self-pay

## 2017-11-24 ENCOUNTER — Other Ambulatory Visit (HOSPITAL_COMMUNITY): Payer: Self-pay

## 2017-11-24 NOTE — Telephone Encounter (Signed)
Pt called to schedule [facility] visit; visit scheduled for tomorrow  @ 9am

## 2017-11-24 NOTE — Progress Notes (Signed)
Paramedicine Encounter    Patient ID: Jonathan [name], male    DOB: 1985-12-02, 32 y.o.   MRN: [id number]    Patient Care Team: Valinda Party, DO as PCP - General (Internal Medicine)  Patient Active Problem List   Diagnosis Date Noted  . Depression 11/06/2017  . Charcot ankle, left 05/31/2017  . Acute pain of left foot 05/03/2017  . Diabetic polyneuropathy associated with type 2 diabetes mellitus ([place]) 03/14/2017  . OSA (obstructive sleep apnea) 12/02/2016  . Chronic systolic (congestive) heart failure ([place])   . Obesity, Class III, BMI 40-49.9 (morbid obesity) ([place]) 10/26/2016  . Asthma, chronic, mild persistent, uncomplicated 04/59/9774  . Essential hypertension 10/26/2016  . Diabetes mellitus type 2 in obese ([place]) 04/20/2016    Current Outpatient Medications:  .  carvedilol (COREG) 25 MG tablet, Take 0.5 tablets (12.5 mg total) by mouth 2 (two) times daily with a meal., Disp: 30 tablet, Rfl: 11 .  empagliflozin (JARDIANCE) 25 MG TABS tablet, Take 25 mg by mouth daily., Disp: 30 tablet, Rfl: 2 .  gabapentin (NEURONTIN) 300 MG capsule, Take 1 capsule (300 mg total) by mouth 3 (three) times daily., Disp: 90 capsule, Rfl: 3 .  JANUVIA 100 MG tablet, TAKE 1 TABLET BY MOUTH EVERY DAY, Disp: 30 tablet, Rfl: 3 .  sacubitril-valsartan (ENTRESTO) 97-103 MG, Take 1 tablet by mouth 2 (two) times daily., Disp: 60 tablet, Rfl: 6 .  sertraline (ZOLOFT) 25 MG tablet, Take 1 tablet (25 mg total) by mouth daily., Disp: 30 tablet, Rfl: 2 .  spironolactone (ALDACTONE) 25 MG tablet, Take 1 tablet (25 mg total) by mouth daily., Disp: 30 tablet, Rfl: 6 .  torsemide (DEMADEX) 20 MG tablet, Take 1 tablet (20 mg total) by mouth 2 (two) times daily., Disp: 180 tablet, Rfl: 3 .  Amino Acids (L-CARNITINE PO), Take 1 tablet daily by mouth., Disp: , Rfl:  .  amLODipine (NORVASC) 5 MG tablet, Take 1 tablet (5 mg total) by mouth daily., Disp: 30 tablet, Rfl: 11 .  blood glucose meter kit and supplies KIT,  Dispense based on patient and insurance preference. Use up to four times daily as directed. (FOR ICD-9 250.00, 250.01). For QAC - HS accuchecks. May switch to any brand., Disp: 1 each, Rfl: 1 .  calcitonin, salmon, (MIACALCIN/FORTICAL) 200 UNIT/ACT nasal spray, PLACE 1 SPRAY INTO ALTERNATE NOSTRILS DAILY, Disp: 3.7 mL, Rfl: 0 .  ketoconazole (NIZORAL) 2 % cream, Apply 1 fingertip amount to each foot daily., Disp: 30 g, Rfl: 0 .  metolazone (ZAROXOLYN) 2.5 MG tablet, Take 1 tablet (2.5 mg total) by mouth as needed. As directed by HF clinic for weight gain of 3 lbs overnight or 5 lbs within one week., Disp: 10 tablet, Rfl: 2 .  tiotropium (SPIRIVA) 18 MCG inhalation capsule, Place 18 mcg daily into inhaler and inhale., Disp: , Rfl:  Allergies  Allergen Reactions  . Hydrocodone     Hives      Social History   Socioeconomic History  . Marital status: Married    Spouse name: Not on file  . Number of children: Not on file  . Years of education: Not on file  . Highest education level: Not on file  Occupational History  . Not on file  Social Needs  . Financial resource strain: Not on file  . Food insecurity:    Worry: Not on file    Inability: Not on file  . Transportation needs:    Medical: Not on file  Non-medical: Not on file  Tobacco Use  . Smoking status: Never Smoker  . Smokeless tobacco: Never Used  Substance and Sexual Activity  . Alcohol use: No  . Drug use: No  . Sexual activity: Never  Lifestyle  . Physical activity:    Days per week: 0 days    Minutes per session: 0 min  . Stress: Very much  Relationships  . Social connections:    Talks on phone: Not on file    Gets together: Not on file    Attends religious service: Not on file    Active member of club or organization: Not on file    Attends meetings of clubs or organizations: Not on file    Relationship status: Not on file  . Intimate partner violence:    Fear of current or ex partner: Not on file     Emotionally abused: Not on file    Physically abused: Not on file    Forced sexual activity: Not on file  Other Topics Concern  . Not on file  Social History Narrative  . Not on file    Physical Exam  Pulmonary/Chest: Effort normal. No respiratory distress.  Musculoskeletal: He exhibits no edema.  Skin: He is not diaphoretic.        Future Appointments  Date Time Provider [place]  12/15/2017  2:45 PM Valinda Party, DO [facility]  12/29/2017 10:45 AM March Rummage, Christian Mate, DPM [facility]     BP 104/60 (BP Location: Left Arm, Patient Position: Sitting, Cuff Size: Large)   Pulse (!) 111   Resp 16   Wt (!) 384 lb 9.6 oz (174.5 kg)   SpO2 97%   BMI 49.38 kg/m  cbg 361   ATF pt CAO x4 sitting at the table watching tv.  Pt stated that he didn't take meds for about 4 days due to "not feeling well, head cold".  He stated that he took his meds this morning because he feels better. I advised him that he should still take his medications.  rx bottles verified except the carvedilol. He stated that he must have misplaced the bottle.  Pt denies sob, chest pain and dizziness.   Medication ordered: Carvedilol is missing   Kalisha Keadle, EMT Paramedic [phone] 11/24/2017    ACTION: Home visit completed

## 2017-11-25 ENCOUNTER — Other Ambulatory Visit: Payer: Self-pay | Admitting: Internal Medicine

## 2017-11-25 DIAGNOSIS — E1169 Type 2 diabetes mellitus with other specified complication: Secondary | ICD-10-CM

## 2017-11-25 DIAGNOSIS — E669 Obesity, unspecified: Principal | ICD-10-CM

## 2017-11-25 NOTE — Telephone Encounter (Signed)
Next appt scheduled 10/31 with PCP. 

## 2017-11-26 ENCOUNTER — Other Ambulatory Visit: Payer: Self-pay | Admitting: Internal Medicine

## 2017-12-01 ENCOUNTER — Other Ambulatory Visit: Payer: Self-pay | Admitting: Internal Medicine

## 2017-12-01 MED ORDER — EMPAGLIFLOZIN 25 MG PO TABS: 25.0000 mg | ORAL_TABLET | Freq: Every day | ORAL | 2 refills | Status: DC
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
## 2017-12-15 ENCOUNTER — Other Ambulatory Visit: Payer: Self-pay

## 2017-12-15 ENCOUNTER — Ambulatory Visit (INDEPENDENT_AMBULATORY_CARE_PROVIDER_SITE_OTHER): Payer: BLUE CROSS/BLUE SHIELD | Admitting: Internal Medicine

## 2017-12-15 ENCOUNTER — Encounter: Payer: Self-pay | Admitting: Internal Medicine

## 2017-12-15 VITALS — BP 120/65 | HR 81 | Temp 98.9°F | Wt 384.4 lb

## 2017-12-15 DIAGNOSIS — R05 Cough: Secondary | ICD-10-CM

## 2017-12-15 DIAGNOSIS — F329 Major depressive disorder, single episode, unspecified: Secondary | ICD-10-CM | POA: Diagnosis not present

## 2017-12-15 DIAGNOSIS — M25561 Pain in right knee: Secondary | ICD-10-CM

## 2017-12-15 DIAGNOSIS — Z79899 Other long term (current) drug therapy: Secondary | ICD-10-CM

## 2017-12-15 DIAGNOSIS — F32A Depression, unspecified: Secondary | ICD-10-CM

## 2017-12-15 NOTE — Patient Instructions (Signed)
Mr. Bronstein,  It was a pleasure seeing you today.  Please follow up in the acute care clinic in December. I will be working there in December.  For your knee please use a knee wrap, ice, rest and elevation to help with the pain.  You can also use Tylenol for the pain.

## 2017-12-16 DIAGNOSIS — M25561 Pain in right knee: Secondary | ICD-10-CM | POA: Insufficient documentation

## 2017-12-16 NOTE — Assessment & Plan Note (Signed)
Assessment: Acute right knee pain Patient presents with a three-day history of right knee pain noted to have tenderness lateral to the patella on exam. Very minimal erythema and no swelling noted. Point-of-care ultrasound was done without evidence of effusion.  No recent trauma.  I suspect there is some irritation to the area from overuse and symptoms at this time would best be managed with conservative therapy including Tylenol, ice, knee brace and rest. Told to follow-up if pain persists or becomes worse.  Plan -Ice, brace and rest  - return to clinic if pain does not improve

## 2017-12-16 NOTE — Assessment & Plan Note (Signed)
Assessment:  Depression Patient was started on zoloft 25mg  daily last month. Phq-9 was 4 today.  Patient was referred to psychology and unfortunately he was unable to make his first appointment.  He states he would like to follow with our behavior health specialist in our clinic as it would be easier to attend sessions in the [facility].  Plan - Zoloft 25 mg daily -BH in [facility]

## 2017-12-19 NOTE — Progress Notes (Signed)
Internal Medicine Clinic Attending  I saw and evaluated the patient.  I personally confirmed the key portions of the history and exam documented by Dr. Hoffman and I reviewed pertinent patient test results.  The assessment, diagnosis, and plan were formulated together and I agree with the documentation in the resident's note.      

## 2017-12-21 NOTE — Addendum Note (Signed)
Addended by: Neomia Dear on: 12/21/2017 06:32 AM   Modules accepted: Orders

## 2017-12-27 ENCOUNTER — Telehealth (HOSPITAL_COMMUNITY): Payer: Self-pay

## 2017-12-27 NOTE — Telephone Encounter (Signed)
Pt called to schedule a [facility]P visit. He stated that he is out of several medications including the entresto 49-51.

## 2017-12-28 ENCOUNTER — Other Ambulatory Visit (HOSPITAL_COMMUNITY): Payer: Self-pay

## 2017-12-28 NOTE — Progress Notes (Signed)
Paramedicine Encounter    Patient ID: Jonathan [name], male    DOB: November 24, 1985, 31 y.o.   MRN: [id number]    Patient Care Team: Valinda Party, DO as PCP - General (Internal Medicine)  Patient Active Problem List   Diagnosis Date Noted  . Acute pain of right knee 12/16/2017  . Depression 11/06/2017  . Charcot ankle, left 05/31/2017  . Acute pain of left foot 05/03/2017  . Diabetic polyneuropathy associated with type 2 diabetes mellitus ([place]) 03/14/2017  . OSA (obstructive sleep apnea) 12/02/2016  . Chronic systolic (congestive) heart failure ([place])   . Obesity, Class III, BMI 40-49.9 (morbid obesity) ([place]) 10/26/2016  . Asthma, chronic, mild persistent, uncomplicated 94/76/5465  . Essential hypertension 10/26/2016  . Diabetes mellitus type 2 in obese ([place]) 04/20/2016    Current Outpatient Medications:  .  carvedilol (COREG) 25 MG tablet, Take 0.5 tablets (12.5 mg total) by mouth 2 (two) times daily with a meal., Disp: 30 tablet, Rfl: 11 .  empagliflozin (JARDIANCE) 25 MG TABS tablet, Take 25 mg by mouth daily., Disp: 30 tablet, Rfl: 2 .  gabapentin (NEURONTIN) 300 MG capsule, Take 1 capsule (300 mg total) by mouth 3 (three) times daily., Disp: 90 capsule, Rfl: 3 .  JANUVIA 100 MG tablet, TAKE 1 TABLET BY MOUTH EVERY DAY, Disp: 90 tablet, Rfl: 1 .  sacubitril-valsartan (ENTRESTO) 97-103 MG, Take 1 tablet by mouth 2 (two) times daily., Disp: 60 tablet, Rfl: 6 .  spironolactone (ALDACTONE) 25 MG tablet, Take 1 tablet (25 mg total) by mouth daily., Disp: 30 tablet, Rfl: 6 .  torsemide (DEMADEX) 20 MG tablet, Take 1 tablet (20 mg total) by mouth 2 (two) times daily., Disp: 180 tablet, Rfl: 3 .  Amino Acids (L-CARNITINE PO), Take 1 tablet daily by mouth., Disp: , Rfl:  .  amLODipine (NORVASC) 5 MG tablet, Take 1 tablet (5 mg total) by mouth daily., Disp: 30 tablet, Rfl: 11 .  blood glucose meter kit and supplies KIT, Dispense based on patient and insurance preference. Use up to four  times daily as directed. (FOR ICD-9 250.00, 250.01). For QAC - HS accuchecks. May switch to any brand., Disp: 1 each, Rfl: 1 .  calcitonin, salmon, (MIACALCIN/FORTICAL) 200 UNIT/ACT nasal spray, PLACE 1 SPRAY INTO ALTERNATE NOSTRILS DAILY, Disp: 3.7 mL, Rfl: 0 .  ketoconazole (NIZORAL) 2 % cream, Apply 1 fingertip amount to each foot daily., Disp: 30 g, Rfl: 0 .  metolazone (ZAROXOLYN) 2.5 MG tablet, Take 1 tablet (2.5 mg total) by mouth as needed. As directed by HF clinic for weight gain of 3 lbs overnight or 5 lbs within one week., Disp: 10 tablet, Rfl: 2 .  sertraline (ZOLOFT) 25 MG tablet, TAKE 1 TABLET BY MOUTH EVERY DAY, Disp: 90 tablet, Rfl: 1 .  tiotropium (SPIRIVA) 18 MCG inhalation capsule, Place 18 mcg daily into inhaler and inhale., Disp: , Rfl:  Allergies  Allergen Reactions  . Hydrocodone     Hives      Social History   Socioeconomic History  . Marital status: Married    Spouse name: Not on file  . Number of children: Not on file  . Years of education: Not on file  . Highest education level: Not on file  Occupational History  . Not on file  Social Needs  . Financial resource strain: Not on file  . Food insecurity:    Worry: Not on file    Inability: Not on file  . Transportation needs:  Medical: Not on file    Non-medical: Not on file  Tobacco Use  . Smoking status: Never Smoker  . Smokeless tobacco: Never Used  Substance and Sexual Activity  . Alcohol use: No  . Drug use: No  . Sexual activity: Never  Lifestyle  . Physical activity:    Days per week: 0 days    Minutes per session: 0 min  . Stress: Very much  Relationships  . Social connections:    Talks on phone: Not on file    Gets together: Not on file    Attends religious service: Not on file    Active member of club or organization: Not on file    Attends meetings of clubs or organizations: Not on file    Relationship status: Not on file  . Intimate partner violence:    Fear of current or ex  partner: Not on file    Emotionally abused: Not on file    Physically abused: Not on file    Forced sexual activity: Not on file  Other Topics Concern  . Not on file  Social History Narrative  . Not on file    Physical Exam  Pulmonary/Chest: Effort normal. No respiratory distress. He has no wheezes.  Abdominal: Soft. He exhibits distension.  Musculoskeletal: He exhibits edema.  abdomen  Skin: Skin is warm and dry. He is not diaphoretic.        Future Appointments  Date Time Provider [place]  12/29/2017 10:45 AM Evelina Bucy, DPM [facility]  02/02/2018  3:15 PM Heber Milo, Elza Rafter, DO [facility]     Pulse 99   Resp 16   Wt (!) 378 lb (171.5 kg)   SpO2 100%   BMI 48.53 kg/m  BP 110/p  Weight yesterday-380.2lb Last visit weight-384  ATF pt CAO x4 sitting down in the living room. Pt stated that he has taken his medications without missing any, however his pill bottles has 10/29/17. Pt appears to sad and sleepy.  Pt stated that he stopped taking zoloft about 2 weeks ago and he will not resume taking it Stated that he doesn't want to take zoloft because it doesn't work; his grandmother gave him herbal meds to take instead. He stated that he has no energy getting around. rx bottles verified.     Pt is still out of entresto.  I called [facility] today; pt stated that he doesn't have a ride to the pharmacy. I'll pick them up.   Medication ordered: Aviva Signs Vergene Marland, EMT Paramedic [phone] 12/28/2017    ACTION: Home visit completed

## 2017-12-29 ENCOUNTER — Ambulatory Visit: Payer: BLUE CROSS/BLUE SHIELD | Admitting: Podiatry

## 2017-12-29 DIAGNOSIS — M21071 Valgus deformity, not elsewhere classified, right ankle: Secondary | ICD-10-CM

## 2017-12-29 DIAGNOSIS — M21072 Valgus deformity, not elsewhere classified, left ankle: Secondary | ICD-10-CM | POA: Diagnosis not present

## 2018-01-11 ENCOUNTER — Ambulatory Visit: Payer: BLUE CROSS/BLUE SHIELD | Admitting: Orthotics

## 2018-01-11 DIAGNOSIS — M21071 Valgus deformity, not elsewhere classified, right ankle: Secondary | ICD-10-CM

## 2018-01-11 DIAGNOSIS — E11610 Type 2 diabetes mellitus with diabetic neuropathic arthropathy: Secondary | ICD-10-CM

## 2018-01-11 DIAGNOSIS — M21072 Valgus deformity, not elsewhere classified, left ankle: Secondary | ICD-10-CM

## 2018-01-11 DIAGNOSIS — E1142 Type 2 diabetes mellitus with diabetic polyneuropathy: Secondary | ICD-10-CM

## 2018-01-11 NOTE — Progress Notes (Signed)
Add medial RF wedge to help further offset RF valgus deformity; also add moleskin or PPT to take some space in brace.

## 2018-01-20 ENCOUNTER — Ambulatory Visit: Payer: BLUE CROSS/BLUE SHIELD | Admitting: Orthotics

## 2018-01-20 DIAGNOSIS — M21071 Valgus deformity, not elsewhere classified, right ankle: Secondary | ICD-10-CM

## 2018-01-20 DIAGNOSIS — E11610 Type 2 diabetes mellitus with diabetic neuropathic arthropathy: Secondary | ICD-10-CM

## 2018-01-20 DIAGNOSIS — E1142 Type 2 diabetes mellitus with diabetic polyneuropathy: Secondary | ICD-10-CM

## 2018-01-20 DIAGNOSIS — M21072 Valgus deformity, not elsewhere classified, left ankle: Secondary | ICD-10-CM

## 2018-01-20 NOTE — Progress Notes (Signed)
Added varus wedge and felt padding to brace to make fit better due to decreased edema.

## 2018-01-24 ENCOUNTER — Other Ambulatory Visit (HOSPITAL_COMMUNITY): Payer: Self-pay | Admitting: Student

## 2018-01-27 ENCOUNTER — Telehealth (HOSPITAL_COMMUNITY): Payer: Self-pay

## 2018-01-27 NOTE — Telephone Encounter (Signed)
Pt called to schedule [facility]P visit for next week.  He stated that he need his medications picked up from [facility] pharmacy. I agree to pick them up prior to our visit.

## 2018-02-02 ENCOUNTER — Other Ambulatory Visit: Payer: Self-pay

## 2018-02-02 ENCOUNTER — Other Ambulatory Visit (HOSPITAL_COMMUNITY): Payer: Self-pay

## 2018-02-02 ENCOUNTER — Encounter (HOSPITAL_COMMUNITY): Payer: Self-pay

## 2018-02-02 ENCOUNTER — Ambulatory Visit (INDEPENDENT_AMBULATORY_CARE_PROVIDER_SITE_OTHER): Payer: BLUE CROSS/BLUE SHIELD | Admitting: Internal Medicine

## 2018-02-02 ENCOUNTER — Encounter: Payer: Self-pay | Admitting: Internal Medicine

## 2018-02-02 ENCOUNTER — Telehealth (HOSPITAL_COMMUNITY): Payer: Self-pay

## 2018-02-02 VITALS — BP 130/69 | HR 102 | Temp 98.6°F | Ht 74.0 in | Wt 386.9 lb

## 2018-02-02 DIAGNOSIS — I10 Essential (primary) hypertension: Secondary | ICD-10-CM

## 2018-02-02 DIAGNOSIS — Z79899 Other long term (current) drug therapy: Secondary | ICD-10-CM

## 2018-02-02 DIAGNOSIS — E669 Obesity, unspecified: Secondary | ICD-10-CM | POA: Diagnosis not present

## 2018-02-02 DIAGNOSIS — Z7984 Long term (current) use of oral hypoglycemic drugs: Secondary | ICD-10-CM

## 2018-02-02 DIAGNOSIS — E1169 Type 2 diabetes mellitus with other specified complication: Secondary | ICD-10-CM

## 2018-02-02 DIAGNOSIS — M542 Cervicalgia: Secondary | ICD-10-CM

## 2018-02-02 DIAGNOSIS — F32A Depression, unspecified: Secondary | ICD-10-CM

## 2018-02-02 DIAGNOSIS — M549 Dorsalgia, unspecified: Secondary | ICD-10-CM

## 2018-02-02 DIAGNOSIS — Z6841 Body Mass Index (BMI) 40.0 and over, adult: Secondary | ICD-10-CM

## 2018-02-02 DIAGNOSIS — F329 Major depressive disorder, single episode, unspecified: Secondary | ICD-10-CM

## 2018-02-02 LAB — POCT GLYCOSYLATED HEMOGLOBIN (HGB A1C): Hemoglobin A1C: 9.3 % — AB (ref 4.0–5.6)

## 2018-02-02 LAB — GLUCOSE, CAPILLARY: Glucose-Capillary: 153 mg/dL — ABNORMAL HIGH (ref 70–99)

## 2018-02-02 MED ORDER — SEMAGLUTIDE 3 MG PO TABS: 3.0000 mg | ORAL_TABLET | Freq: Every day | ORAL | 0 refills | Status: DC

## 2018-02-02 MED ORDER — SERTRALINE HCL 25 MG PO TABS: 25.0000 mg | ORAL_TABLET | Freq: Every day | ORAL | 1 refills | Status: DC

## 2018-02-02 MED ORDER — GLUCOSE BLOOD VI STRP: ORAL_STRIP | 12 refills | Status: DC

## 2018-02-02 MED ORDER — SEMAGLUTIDE 7 MG PO TABS: 7.0000 mg | ORAL_TABLET | Freq: Every day | ORAL | 1 refills | Status: DC

## 2018-02-02 NOTE — Progress Notes (Signed)
   CC: follow-up on type 2 diabetes  HPI:  [name] is a 32 y.o. male with history noted below presents to the internal medicine clinic for follow-up on type 2 diabetes. Please see problem based charting for the status of patient's chronic medical conditions.  Past Medical History:  Diagnosis Date  . Acute pain of left foot 05/03/2017  . Asthma   . Asthma, chronic, mild persistent, uncomplicated 10/26/2016  . Charcot ankle, left 05/31/2017  . CHF (congestive heart failure) (HCC)   . Chronic systolic (congestive) heart failure (HCC)   . Diabetes mellitus type 2 in obese (HCC) 04/20/2016  . Diabetic polyneuropathy associated with type 2 diabetes mellitus (HCC) 03/14/2017  . Essential hypertension 10/26/2016  . Hypertension   . Obesity   . Obesity, Class III, BMI 40-49.9 (morbid obesity) (HCC) 10/26/2016  . OSA (obstructive sleep apnea) 12/02/2016  . Type 2 diabetes mellitus with diabetic neuropathy (HCC)     Review of Systems:  Review of Systems  Respiratory: Negative for shortness of breath.   Cardiovascular: Negative for chest pain, orthopnea and leg swelling.     Physical Exam:  Vitals:   02/02/18 1459  BP: 130/69  Pulse: (!) 102  Temp: 98.6 F (37 C)  TempSrc: Oral  SpO2: 100%  Weight: (!) 386 lb 14.4 oz (175.5 kg)  Height: 6\' 2"  (1.88 m)   Physical Exam  Constitutional: He is well-developed, well-nourished, and in no distress.  Cardiovascular: Normal rate, regular rhythm and normal heart sounds. Exam reveals no gallop and no friction rub.  No murmur heard. Pulmonary/Chest: Effort normal and breath sounds normal. No respiratory distress. He has no wheezes. He has no rales.  Musculoskeletal:     Comments: Tenderness and tightness to paraspinal musculature of the cervical spine.  Tenderness and tightness to the trapezius muscles     Assessment & Plan:   See encounters tab for problem based medical decision making.   Patient discussed with Dr. Oswaldo Done

## 2018-02-02 NOTE — Patient Instructions (Addendum)
   Semaglutide - take 3mg  once daily for 1 month, then take 7mg  once daily for 1 month.  Follow up in 2 months with meter.

## 2018-02-03 MED ORDER — SITAGLIPTIN PHOSPHATE 100 MG PO TABS: 100.0000 mg | ORAL_TABLET | Freq: Every day | ORAL | 2 refills | Status: DC

## 2018-02-03 MED ORDER — EMPAGLIFLOZIN 25 MG PO TABS: 25.0000 mg | ORAL_TABLET | Freq: Every day | ORAL | 2 refills | Status: DC

## 2018-02-03 NOTE — Assessment & Plan Note (Signed)
Assessment: Depression Patient is not taking Zoloft 25 mg daily. PHQ9 today was 4.  Will stop zoloft  Plan -stop zoloft

## 2018-02-03 NOTE — Telephone Encounter (Signed)
Pt called to confirm today's visit, he stated that he hasn't picked up his meds yet and doesn't have transportation.

## 2018-02-03 NOTE — Assessment & Plan Note (Addendum)
Assessment: Neck and back pain complicated by obesity  Pain appears musculature in nature.  Recommended patient do stretches and massages to the area. Patient states he would like to be evaluated by a chiropractor.  Plan -chiropractor referral

## 2018-02-03 NOTE — Assessment & Plan Note (Signed)
Assessment:  Hypertension Currently takes amlodipine 5mg , carvedilol 25mg , entresto and spironolactone and reports adherence to the medication.  Today's blood pressure is 130/69, well controlled and at goal.  Plan -continue current blood pressure regimen

## 2018-02-03 NOTE — Assessment & Plan Note (Signed)
Assessment: Uncontrolled type 2 diabetes Patiently currently takes [place].  Hemoglobin A1c today is 9.3. This is a difficult case since patient declines the use of insulin or any injectable medications and has declined using glipizide and metformin due to side effects.  He did not bring his meter today.  At this time will start semaglutide.     Plan -start semaglutide -continue jardiance and [place]

## 2018-02-03 NOTE — Progress Notes (Signed)
Picked up meds from [facility]

## 2018-02-03 NOTE — Progress Notes (Signed)
Paramedicine Encounter    Patient ID: Jonathan [name], male    DOB: 15-Sep-1985, 32 y.o.   MRN: [id number]    Patient Care Team: Valinda Party, DO as PCP - General (Internal Medicine) Jorge Ny, LCSW as Social Worker (Licensed Clinical Social Worker)  Patient Active Problem List   Diagnosis Date Noted  . Acute pain of right knee 12/16/2017  . Depression 11/06/2017  . Charcot ankle, left 05/31/2017  . Acute pain of left foot 05/03/2017  . Diabetic polyneuropathy associated with type 2 diabetes mellitus ([place]) 03/14/2017  . OSA (obstructive sleep apnea) 12/02/2016  . Chronic systolic (congestive) heart failure ([place])   . Obesity, Class III, BMI 40-49.9 (morbid obesity) ([place]) 10/26/2016  . Asthma, chronic, mild persistent, uncomplicated 33/54/5625  . Essential hypertension 10/26/2016  . Diabetes mellitus type 2 in obese ([place]) 04/20/2016    Current Outpatient Medications:  .  Amino Acids (L-CARNITINE PO), Take 1 tablet daily by mouth., Disp: , Rfl:  .  amLODipine (NORVASC) 5 MG tablet, Take 1 tablet (5 mg total) by mouth daily., Disp: 30 tablet, Rfl: 11 .  blood glucose meter kit and supplies KIT, Dispense based on patient and insurance preference. Use up to four times daily as directed. (FOR ICD-9 250.00, 250.01). For QAC - HS accuchecks. May switch to any brand., Disp: 1 each, Rfl: 1 .  calcitonin, salmon, (MIACALCIN/FORTICAL) 200 UNIT/ACT nasal spray, PLACE 1 SPRAY INTO ALTERNATE NOSTRILS DAILY, Disp: 3.7 mL, Rfl: 0 .  carvedilol (COREG) 25 MG tablet, Take 0.5 tablets (12.5 mg total) by mouth 2 (two) times daily with a meal., Disp: 30 tablet, Rfl: 11 .  empagliflozin (JARDIANCE) 25 MG TABS tablet, Take 25 mg by mouth daily., Disp: 90 tablet, Rfl: 2 .  gabapentin (NEURONTIN) 300 MG capsule, Take 1 capsule (300 mg total) by mouth 3 (three) times daily., Disp: 90 capsule, Rfl: 3 .  glucose blood (CONTOUR NEXT TEST) test strip, Use as instructed, Disp: 100 each, Rfl: 12 .   ketoconazole (NIZORAL) 2 % cream, Apply 1 fingertip amount to each foot daily., Disp: 30 g, Rfl: 0 .  metolazone (ZAROXOLYN) 2.5 MG tablet, Take 1 tablet (2.5 mg total) by mouth as needed. As directed by HF clinic for weight gain of 3 lbs overnight or 5 lbs within one week., Disp: 10 tablet, Rfl: 2 .  sacubitril-valsartan (ENTRESTO) 97-103 MG, Take 1 tablet by mouth 2 (two) times daily., Disp: 60 tablet, Rfl: 6 .  Semaglutide (RYBELSUS) 3 MG TABS, Take 3 mg by mouth daily., Disp: 30 tablet, Rfl: 0 .  Semaglutide (RYBELSUS) 7 MG TABS, Take 7 mg by mouth daily., Disp: 30 tablet, Rfl: 1 .  sitaGLIPtin (JANUVIA) 100 MG tablet, Take 1 tablet (100 mg total) by mouth daily., Disp: 90 tablet, Rfl: 2 .  spironolactone (ALDACTONE) 25 MG tablet, TAKE 1 TABLET BY MOUTH EVERY DAY, Disp: 90 tablet, Rfl: 0 .  tiotropium (SPIRIVA) 18 MCG inhalation capsule, Place 18 mcg daily into inhaler and inhale., Disp: , Rfl:  .  torsemide (DEMADEX) 20 MG tablet, Take 1 tablet (20 mg total) by mouth 2 (two) times daily., Disp: 180 tablet, Rfl: 3 Allergies  Allergen Reactions  . Hydrocodone     Hives      Social History   Socioeconomic History  . Marital status: Married    Spouse name: Not on file  . Number of children: Not on file  . Years of education: Not on file  . Highest education level: Not  on file  Occupational History  . Not on file  Social Needs  . Financial resource strain: Not on file  . Food insecurity:    Worry: Not on file    Inability: Not on file  . Transportation needs:    Medical: Not on file    Non-medical: Not on file  Tobacco Use  . Smoking status: Never Smoker  . Smokeless tobacco: Never Used  Substance and Sexual Activity  . Alcohol use: No  . Drug use: No  . Sexual activity: Never  Lifestyle  . Physical activity:    Days per week: 0 days    Minutes per session: 0 min  . Stress: Very much  Relationships  . Social connections:    Talks on phone: Not on file    Gets  together: Not on file    Attends religious service: Not on file    Active member of club or organization: Not on file    Attends meetings of clubs or organizations: Not on file    Relationship status: Not on file  . Intimate partner violence:    Fear of current or ex partner: Not on file    Emotionally abused: Not on file    Physically abused: Not on file    Forced sexual activity: Not on file  Other Topics Concern  . Not on file  Social History Narrative  . Not on file    Physical Exam Pulmonary:     Effort: Pulmonary effort is normal. No respiratory distress.     Breath sounds: No wheezing or rales.  Abdominal:     Tenderness: There is no abdominal tenderness.  Musculoskeletal:     Right lower leg: No edema.     Left lower leg: No edema.         Future Appointments  Date Time Provider [place]  05/11/2018  1:15 PM Kalman Shan Ratliff, DO [facility]     BP 112/70 (BP Location: Left Arm, Patient Position: Sitting, Cuff Size: Large)   Pulse (!) 105   Resp 16   SpO2 99%  cbg 281 Weight yesterday-didn't weigh Last visit weight-378  ATF pt CAO x4 sitting at the table playing video games. He stated that he feels "ok". He has days that he doesn't have any energy to get up and out of the bed. He stated that he's not depressed that he just doesn't have anything to do. Pt stated that he's still taking his meds besides "the fluid pill, because it makes his legs hurt when he takes them".  Pt was re-educated on the medication and advised to speak with his pcp about the same during his appt later today. rx bottles verified.     Medication ordered: none  Sheriden Archibeque, EMT Paramedic [phone] 02/03/2018    ACTION: Home visit completed

## 2018-02-06 NOTE — Progress Notes (Signed)
Internal Medicine Clinic Attending  Case discussed with Dr. Hoffman at the time of the visit.  We reviewed the resident's history and exam and pertinent patient test results.  I agree with the assessment, diagnosis, and plan of care documented in the resident's note.  

## 2018-02-16 ENCOUNTER — Telehealth: Payer: Self-pay | Admitting: *Deleted

## 2018-02-16 NOTE — Telephone Encounter (Signed)
CALLED PATIENT REGARDING REFERRAL. WHILE ASKING PATIENT HIS DOB. PATIENT HUNG UP ON ME. I CALLED PATIENT BACK, PHONE WENT TO VOICE MAIL. LVM MESSAGE FOR HIM TO RETURN MY CALL.  SENDING LETTER.

## 2018-02-21 ENCOUNTER — Telehealth (HOSPITAL_COMMUNITY): Payer: Self-pay

## 2018-02-21 NOTE — Telephone Encounter (Signed)
Pt called to schedule a CHP visit for this week. He agrees to meet Thursday @ 930

## 2018-02-22 ENCOUNTER — Telehealth: Payer: Self-pay | Admitting: *Deleted

## 2018-02-22 NOTE — Telephone Encounter (Signed)
CALLED PATIENT PATIENT HAS NOT RETURNED CALL REGARDING THIS REFERRAL AS TO WHERE / OFFICE HE IS WANTING TO BE REFERRED TO. SENDING LETTER.

## 2018-02-23 ENCOUNTER — Other Ambulatory Visit (HOSPITAL_COMMUNITY): Payer: Self-pay

## 2018-02-23 ENCOUNTER — Encounter: Payer: Self-pay | Admitting: *Deleted

## 2018-02-23 NOTE — Progress Notes (Signed)
Paramedicine Encounter    Patient ID: Jonathan [name], male    DOB: [birth year], 33 y.o.   MRN: [id number]    Patient Care Team: Valinda Party, DO as PCP - General (Internal Medicine) Jorge Ny, LCSW as Social Worker (Licensed Clinical Social Worker)  Patient Active Problem List   Diagnosis Date Noted  . Acute pain of right knee 12/16/2017  . Depression 11/06/2017  . Charcot ankle, left 05/31/2017  . Acute pain of left foot 05/03/2017  . Diabetic polyneuropathy associated with type 2 diabetes mellitus ([place]) 03/14/2017  . OSA (obstructive sleep apnea) 12/02/2016  . Chronic systolic (congestive) heart failure ([place])   . Obesity, Class III, BMI 40-49.9 (morbid obesity) ([place]) 10/26/2016  . Asthma, chronic, mild persistent, uncomplicated 17/61/6073  . Essential hypertension 10/26/2016  . Diabetes mellitus type 2 in obese ([place]) 04/20/2016    Current Outpatient Medications:  .  Amino Acids (L-CARNITINE PO), Take 1 tablet daily by mouth., Disp: , Rfl:  .  amLODipine (NORVASC) 5 MG tablet, Take 1 tablet (5 mg total) by mouth daily., Disp: 30 tablet, Rfl: 11 .  blood glucose meter kit and supplies KIT, Dispense based on patient and insurance preference. Use up to four times daily as directed. (FOR ICD-9 250.00, 250.01). For QAC - HS accuchecks. May switch to any brand., Disp: 1 each, Rfl: 1 .  calcitonin, salmon, (MIACALCIN/FORTICAL) 200 UNIT/ACT nasal spray, PLACE 1 SPRAY INTO ALTERNATE NOSTRILS DAILY, Disp: 3.7 mL, Rfl: 0 .  carvedilol (COREG) 25 MG tablet, Take 0.5 tablets (12.5 mg total) by mouth 2 (two) times daily with a meal., Disp: 30 tablet, Rfl: 11 .  empagliflozin (JARDIANCE) 25 MG TABS tablet, Take 25 mg by mouth daily., Disp: 90 tablet, Rfl: 2 .  gabapentin (NEURONTIN) 300 MG capsule, Take 1 capsule (300 mg total) by mouth 3 (three) times daily., Disp: 90 capsule, Rfl: 3 .  glucose blood (CONTOUR NEXT TEST) test strip, Use as instructed, Disp: 100 each, Rfl: 12 .   ketoconazole (NIZORAL) 2 % cream, Apply 1 fingertip amount to each foot daily., Disp: 30 g, Rfl: 0 .  metolazone (ZAROXOLYN) 2.5 MG tablet, Take 1 tablet (2.5 mg total) by mouth as needed. As directed by HF clinic for weight gain of 3 lbs overnight or 5 lbs within one week., Disp: 10 tablet, Rfl: 2 .  sacubitril-valsartan (ENTRESTO) 97-103 MG, Take 1 tablet by mouth 2 (two) times daily., Disp: 60 tablet, Rfl: 6 .  Semaglutide (RYBELSUS) 3 MG TABS, Take 3 mg by mouth daily., Disp: 30 tablet, Rfl: 0 .  Semaglutide (RYBELSUS) 7 MG TABS, Take 7 mg by mouth daily., Disp: 30 tablet, Rfl: 1 .  sitaGLIPtin (JANUVIA) 100 MG tablet, Take 1 tablet (100 mg total) by mouth daily., Disp: 90 tablet, Rfl: 2 .  spironolactone (ALDACTONE) 25 MG tablet, TAKE 1 TABLET BY MOUTH EVERY DAY, Disp: 90 tablet, Rfl: 0 .  tiotropium (SPIRIVA) 18 MCG inhalation capsule, Place 18 mcg daily into inhaler and inhale., Disp: , Rfl:  .  torsemide (DEMADEX) 20 MG tablet, Take 1 tablet (20 mg total) by mouth 2 (two) times daily., Disp: 180 tablet, Rfl: 3 Allergies  Allergen Reactions  . Hydrocodone     Hives      Social History   Socioeconomic History  . Marital status: Married    Spouse name: Not on file  . Number of children: Not on file  . Years of education: Not on file  . Highest education level: Not  on file  Occupational History  . Not on file  Social Needs  . Financial resource strain: Not on file  . Food insecurity:    Worry: Not on file    Inability: Not on file  . Transportation needs:    Medical: Not on file    Non-medical: Not on file  Tobacco Use  . Smoking status: Never Smoker  . Smokeless tobacco: Never Used  Substance and Sexual Activity  . Alcohol use: No  . Drug use: No  . Sexual activity: Never  Lifestyle  . Physical activity:    Days per week: 0 days    Minutes per session: 0 min  . Stress: Very much  Relationships  . Social connections:    Talks on phone: Not on file    Gets  together: Not on file    Attends religious service: Not on file    Active member of club or organization: Not on file    Attends meetings of clubs or organizations: Not on file    Relationship status: Not on file  . Intimate partner violence:    Fear of current or ex partner: Not on file    Emotionally abused: Not on file    Physically abused: Not on file    Forced sexual activity: Not on file  Other Topics Concern  . Not on file  Social History Narrative  . Not on file    Physical Exam      Future Appointments  Date Time Provider [place]  05/11/2018  1:15 PM Kalman Shan Ratliff, DO [facility]     BP 106/88 (BP Location: Right Arm, Patient Position: Sitting, Cuff Size: Large)   Pulse 100   Resp 16   SpO2 98%  CBG 239   ATF pt CAO x4 sitting in the living room talking with his wife.  He stated he hasn't taken his medications much this week because he "didn't feel like it'.  Pt stated that he hasn't had much energy for the past couple of days. He also doesn't check his weight daily. His wife stated that he has been walking outside a lot more and she's trying to encourage him to leave the house.  Pt refuse any psychological treatment.  rx bottles verified during this visit.   Pt is still on his wife insurance and he doesn't have any issues with getting his medications. He also has a pcp; he know the zones and when to call the heart failure clinic.  Today is pt's last CHP visit. He was advised to call the Education officer, museum or nurse navigator @ Advanced heart failure team if needed.    Medication ordered: none Mata Rowen, EMT Paramedic [phone] 02/23/2018    ACTION: Home visit completed

## 2018-03-12 NOTE — Progress Notes (Signed)
Subjective:  Patient ID: Jonathan [name], male    DOB: 1985-04-17,  MRN: [id number]  Chief Complaint  Patient presents with  . Diabetes    8 week follow up left Charcot's disease / check brace    33 y.o. male presents with the above complaint.  States the brace is working but still feels a little unstable.  Review of Systems: Negative except as noted in the HPI. Denies N/V/F/Ch.  Past Medical History:  Diagnosis Date  . Acute pain of left foot 05/03/2017  . Asthma   . Asthma, chronic, mild persistent, uncomplicated 2/77/8242  . Charcot ankle, left 05/31/2017  . CHF (congestive heart failure) ([place])   . Chronic systolic (congestive) heart failure ([place])   . Diabetes mellitus type 2 in obese ([place]) 04/20/2016  . Diabetic polyneuropathy associated with type 2 diabetes mellitus ([place]) 03/14/2017  . Essential hypertension 10/26/2016  . Hypertension   . Obesity   . Obesity, Class III, BMI 40-49.9 (morbid obesity) ([place]) 10/26/2016  . OSA (obstructive sleep apnea) 12/02/2016  . Type 2 diabetes mellitus with diabetic neuropathy (HCC)     Current Outpatient Medications:  .  Amino Acids (L-CARNITINE PO), Take 1 tablet daily by mouth., Disp: , Rfl:  .  amLODipine (NORVASC) 5 MG tablet, Take 1 tablet (5 mg total) by mouth daily., Disp: 30 tablet, Rfl: 11 .  blood glucose meter kit and supplies KIT, Dispense based on patient and insurance preference. Use up to four times daily as directed. (FOR ICD-9 250.00, 250.01). For QAC - HS accuchecks. May switch to any brand., Disp: 1 each, Rfl: 1 .  calcitonin, salmon, (MIACALCIN/FORTICAL) 200 UNIT/ACT nasal spray, PLACE 1 SPRAY INTO ALTERNATE NOSTRILS DAILY, Disp: 3.7 mL, Rfl: 0 .  carvedilol (COREG) 25 MG tablet, Take 0.5 tablets (12.5 mg total) by mouth 2 (two) times daily with a meal., Disp: 30 tablet, Rfl: 11 .  empagliflozin (JARDIANCE) 25 MG TABS tablet, Take 25 mg by mouth daily., Disp: 90 tablet, Rfl: 2 .  gabapentin (NEURONTIN) 300 MG capsule, Take 1  capsule (300 mg total) by mouth 3 (three) times daily., Disp: 90 capsule, Rfl: 3 .  glucose blood (CONTOUR NEXT TEST) test strip, Use as instructed, Disp: 100 each, Rfl: 12 .  ketoconazole (NIZORAL) 2 % cream, Apply 1 fingertip amount to each foot daily., Disp: 30 g, Rfl: 0 .  metolazone (ZAROXOLYN) 2.5 MG tablet, Take 1 tablet (2.5 mg total) by mouth as needed. As directed by HF clinic for weight gain of 3 lbs overnight or 5 lbs within one week., Disp: 10 tablet, Rfl: 2 .  sacubitril-valsartan (ENTRESTO) 97-103 MG, Take 1 tablet by mouth 2 (two) times daily., Disp: 60 tablet, Rfl: 6 .  Semaglutide (RYBELSUS) 3 MG TABS, Take 3 mg by mouth daily., Disp: 30 tablet, Rfl: 0 .  Semaglutide (RYBELSUS) 7 MG TABS, Take 7 mg by mouth daily., Disp: 30 tablet, Rfl: 1 .  sitaGLIPtin (JANUVIA) 100 MG tablet, Take 1 tablet (100 mg total) by mouth daily., Disp: 90 tablet, Rfl: 2 .  spironolactone (ALDACTONE) 25 MG tablet, TAKE 1 TABLET BY MOUTH EVERY DAY, Disp: 90 tablet, Rfl: 0 .  tiotropium (SPIRIVA) 18 MCG inhalation capsule, Place 18 mcg daily into inhaler and inhale., Disp: , Rfl:  .  torsemide (DEMADEX) 20 MG tablet, Take 1 tablet (20 mg total) by mouth 2 (two) times daily., Disp: 180 tablet, Rfl: 3  Social History   Tobacco Use  Smoking Status Never Smoker  Smokeless Tobacco Never  Used    Allergies  Allergen Reactions  . Hydrocodone     Hives    Objective:  There were no vitals filed for this visit. There is no height or weight on file to calculate BMI. Constitutional Well developed. Well nourished.  Vascular Dorsalis pedis pulses palpable bilaterally. Posterior tibial pulses palpable bilaterally. Capillary refill normal to all digits.  No cyanosis or clubbing noted. Pedal hair growth normal.  Neurologic Normal speech. Oriented to person, place, and time. Epicritic sensation to light touch grossly absent bilaterally.  Dermatologic Nails elongated thickened dystrophic no open wounds. No  skin lesions.  Orthopedic: Normal joint ROM without pain or crepitus bilaterally. Severe ankle valgus bilateral with severe pes planus No bony tenderness.   Radiographs: None today Assessment:   1. Acquired valgus deformity of left ankle   2. Acquired valgus deformity of right ankle    Plan:  Patient was evaluated and treated and all questions answered.  Ankle valgus, pes planus -Continue ankle brace follow-up with orthotist as needed for adjustment  No follow-ups on file.

## 2018-03-17 ENCOUNTER — Encounter: Payer: Self-pay | Admitting: *Deleted

## 2018-03-17 LAB — HM DIABETES EYE EXAM

## 2018-03-27 ENCOUNTER — Telehealth: Payer: Self-pay | Admitting: *Deleted

## 2018-03-27 NOTE — Telephone Encounter (Signed)
SPOKE WITH PATIENT, CALLED THE OFFICE HE REQUESTED TO BE REFERRED TO - [facility]. [phone]  [phone].  PER OFFICE HE DO NOT NEED A REFERRAL. OFFICE RATHER THAT HE CALL HIMSELF FOR WALK IN. PATIENT STATES THAT'S GOOD.

## 2018-04-25 ENCOUNTER — Other Ambulatory Visit (HOSPITAL_COMMUNITY): Payer: Self-pay

## 2018-04-25 MED ORDER — AMLODIPINE BESYLATE 5 MG PO TABS: 5.0000 mg | ORAL_TABLET | Freq: Every day | ORAL | 0 refills | Status: DC

## 2018-04-26 ENCOUNTER — Other Ambulatory Visit (HOSPITAL_COMMUNITY): Payer: Self-pay | Admitting: Student

## 2018-05-11 ENCOUNTER — Other Ambulatory Visit (HOSPITAL_COMMUNITY): Payer: Self-pay | Admitting: Student

## 2018-05-11 ENCOUNTER — Encounter: Payer: Self-pay | Admitting: Internal Medicine

## 2018-05-15 ENCOUNTER — Other Ambulatory Visit (HOSPITAL_COMMUNITY): Payer: Self-pay | Admitting: Student

## 2018-05-24 ENCOUNTER — Other Ambulatory Visit (HOSPITAL_COMMUNITY): Payer: Self-pay

## 2018-05-24 MED ORDER — SPIRONOLACTONE 25 MG PO TABS: 25.0000 mg | ORAL_TABLET | Freq: Every day | ORAL | 0 refills | Status: DC

## 2018-05-31 ENCOUNTER — Telehealth (HOSPITAL_COMMUNITY): Payer: Self-pay | Admitting: Licensed Clinical Social Worker

## 2018-05-31 NOTE — Telephone Encounter (Signed)
CSW contacted patient to check and make sure of adequate food and medications. Patient denies any concerns currently and appreciative of the call. CSW reminded of the importance to stay home and social distance when possible. CSW provided support and encouraged patient to return call if needed. Jackie Shellene Sweigert, LCSW, CCSW-MCS [phone] 

## 2018-06-27 ENCOUNTER — Encounter: Payer: Self-pay | Admitting: Internal Medicine

## 2018-06-28 NOTE — Telephone Encounter (Signed)
Patient wrote through mychart:  "I don't know what drug is causing this allergic reaction.  It's painful" Please follow up with patient if he needs to be seen in the [facility]

## 2018-06-28 NOTE — Telephone Encounter (Signed)
Talked to pt -stated he has had a rash x 2-3 days on his arms. Stated red, itchy and sometimes stings. He did send pictures. Stated he does not think it from anything he ate. Offered an appt - declined, stated he has an appt on the 21st. Advised not waiting a week. Then he agreed to come tomorrow @ 0900 AM in [facility].

## 2018-06-29 ENCOUNTER — Ambulatory Visit: Payer: Self-pay

## 2018-07-06 ENCOUNTER — Other Ambulatory Visit: Payer: Self-pay

## 2018-07-06 ENCOUNTER — Ambulatory Visit (INDEPENDENT_AMBULATORY_CARE_PROVIDER_SITE_OTHER): Payer: Medicaid Other | Admitting: Internal Medicine

## 2018-07-06 DIAGNOSIS — E1169 Type 2 diabetes mellitus with other specified complication: Secondary | ICD-10-CM | POA: Diagnosis not present

## 2018-07-06 DIAGNOSIS — E669 Obesity, unspecified: Secondary | ICD-10-CM

## 2018-07-06 DIAGNOSIS — L509 Urticaria, unspecified: Secondary | ICD-10-CM

## 2018-07-06 DIAGNOSIS — Z7984 Long term (current) use of oral hypoglycemic drugs: Secondary | ICD-10-CM | POA: Diagnosis not present

## 2018-07-06 NOTE — Progress Notes (Signed)
  [facility] Internal Medicine Residency Telephone Encounter Continuity Care Appointment  HPI:   This telephone encounter was created for Jonathan [name] on 07/06/2018 for the following purpose/cc hives.   Past Medical History:  Past Medical History:  Diagnosis Date  . Acute pain of left foot 05/03/2017  . Asthma   . Asthma, chronic, mild persistent, uncomplicated 10/26/2016  . Charcot ankle, left 05/31/2017  . CHF (congestive heart failure) (HCC)   . Chronic systolic (congestive) heart failure (HCC)   . Diabetes mellitus type 2 in obese (HCC) 04/20/2016  . Diabetic polyneuropathy associated with type 2 diabetes mellitus (HCC) 03/14/2017  . Essential hypertension 10/26/2016  . Hypertension   . Obesity   . Obesity, Class III, BMI 40-49.9 (morbid obesity) (HCC) 10/26/2016  . OSA (obstructive sleep apnea) 12/02/2016  . Type 2 diabetes mellitus with diabetic neuropathy (HCC)       ROS:  Review of Systems  Constitutional: Negative for chills and fever.  Respiratory: Negative for shortness of breath and wheezing.   Gastrointestinal: Negative for abdominal pain, nausea and vomiting.  Endo/Heme/Allergies: Positive for environmental allergies.     Assessment / Plan / Recommendations:   Please see A&P under problem oriented charting for assessment of the patient's acute and chronic medical conditions.   As always, pt is advised that if symptoms worsen or new symptoms arise, they should go to an urgent care facility or to to ER for further evaluation.   Consent and Medical Decision Making:   Patient discussed with Dr. Sandre Kitty  This is a telephone encounter between [facility] and Aldean Baker on 07/06/2018 for hives. The visit was conducted with the patient located at home and Aldean Baker at [facility]. The patient's identity was confirmed using their DOB and current address. The patient has consented to being evaluated through a telephone encounter and understands the associated  risks (an examination cannot be done and the patient may need to come in for an appointment) / benefits (allows the patient to remain at home, decreasing exposure to coronavirus). I personally spent 15 minutes on medical discussion.

## 2018-07-10 DIAGNOSIS — L509 Urticaria, unspecified: Secondary | ICD-10-CM | POA: Insufficient documentation

## 2018-07-10 MED ORDER — CETIRIZINE HCL 10 MG PO TABS: 10.0000 mg | ORAL_TABLET | Freq: Every day | ORAL | 1 refills | Status: DC

## 2018-07-10 NOTE — Assessment & Plan Note (Signed)
HPI:  Patient reports spontaneous itchy, little bumps that occurred 2 weeks ago and resolved spontaneously after a few hours.  Assessment:  Hives Patient has pictures under media tab.  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  Will try cetirizine.  Patient states he would also like a referral to an allergist for allergy testing.    Plan  Cetirizine  Allergy/immunology referral

## 2018-07-10 NOTE — Assessment & Plan Note (Signed)
HPI:  Patient currently taking jardiance and [place] daily.  He was unable to afford semaglutide prescribed at last visit.  Patient declines using insulin, glipizide and metformin due to fear of needles and side effects  Assessment:  Uncontrolled type 2 diabetes Last hgb A1c was 9.3.  Does not check his blood sugars at home.  Could not afford semaglutide.  Will try PA.  Plan -hgbA1C -continue jardiance and [place] - semaglutide PA

## 2018-07-12 ENCOUNTER — Telehealth: Payer: Self-pay | Admitting: *Deleted

## 2018-07-12 ENCOUNTER — Other Ambulatory Visit: Payer: Self-pay | Admitting: *Deleted

## 2018-07-12 NOTE — Progress Notes (Signed)
Internal Medicine Clinic Attending  Case discussed with Dr. Hoffman at the time of the visit.  We reviewed the resident's history and exam and pertinent patient test results.  I agree with the assessment, diagnosis, and plan of care documented in the resident's note.  Alexander Raines, M.D., Ph.D.  

## 2018-07-12 NOTE — Telephone Encounter (Signed)
SPOKE WITH PATIENT REGARDING ALLERGY REFERRAL DUE TO INSURANCE [employer] [place]. PATIENT TO CALL INSURANCE TO GET INFORMATION AS TO WHERE WE CAN REFER HIM TOO. WAITING CALL BACK FROM PATIENT.

## 2018-07-12 NOTE — Telephone Encounter (Addendum)
Attempts to get PA for Rybelsus through [employer].  Patient is no longer covered by Medicaid.  Patient now has [employer] number was obtained from patient.  PA was submitted for the Rybelsus.  Information was sent through CoverMyMeds for PA for Rybelsus 3 mg tablets.  Awaiting determination within 24 to 72 hours from [employer] and [employer].  Patient also stated that he was given an appointment to an Allergy Specialist.  Unable to go to appointment not transportation to [facility]  Information  was given to L. Sturdivant and S. Powers.  Angelina Ok, RN 07/12/2018 12:03 PM   Call to [facility]  [id number] Policy # for patient.  Medication does not require a PA.  Will cost patient $10.00.  Call to patient informed him of the cost and that [facility] [address] [facility] him.  Angelina Ok, RN 07/13/2018 10:50 AM.

## 2018-07-12 NOTE — Addendum Note (Signed)
Addended by: Neomia Dear on: 07/12/2018 05:38 PM   Modules accepted: Orders

## 2018-07-19 ENCOUNTER — Other Ambulatory Visit (HOSPITAL_COMMUNITY): Payer: Self-pay | Admitting: Student

## 2018-08-14 ENCOUNTER — Encounter: Payer: Self-pay | Admitting: *Deleted

## 2018-09-19 ENCOUNTER — Other Ambulatory Visit (HOSPITAL_COMMUNITY): Payer: Self-pay | Admitting: Internal Medicine

## 2018-09-29 ENCOUNTER — Other Ambulatory Visit (HOSPITAL_COMMUNITY): Payer: Self-pay | Admitting: Internal Medicine

## 2018-10-04 ENCOUNTER — Telehealth (HOSPITAL_COMMUNITY): Payer: Self-pay | Admitting: Licensed Clinical Social Worker

## 2018-10-04 NOTE — Telephone Encounter (Signed)
CSW contacted patient to follow up and offer support due to Covid-19 and inability to meet with [facility]. Message left for return call if needed. Jackie Jenay Morici, LCSW, CCSW-MCS [phone] 

## 2018-10-10 ENCOUNTER — Other Ambulatory Visit (HOSPITAL_COMMUNITY): Payer: Self-pay | Admitting: Internal Medicine

## 2018-10-21 ENCOUNTER — Other Ambulatory Visit (HOSPITAL_COMMUNITY): Payer: Self-pay | Admitting: Internal Medicine

## 2018-11-07 ENCOUNTER — Other Ambulatory Visit (HOSPITAL_COMMUNITY): Payer: Self-pay | Admitting: Internal Medicine

## 2018-11-17 ENCOUNTER — Other Ambulatory Visit (HOSPITAL_COMMUNITY): Payer: Self-pay | Admitting: Internal Medicine

## 2018-12-04 ENCOUNTER — Encounter: Payer: Self-pay | Admitting: Internal Medicine

## 2018-12-08 ENCOUNTER — Encounter: Payer: Self-pay | Admitting: Radiation Oncology

## 2018-12-08 ENCOUNTER — Ambulatory Visit (INDEPENDENT_AMBULATORY_CARE_PROVIDER_SITE_OTHER): Payer: BLUE CROSS/BLUE SHIELD | Admitting: Radiation Oncology

## 2018-12-08 ENCOUNTER — Other Ambulatory Visit: Payer: Self-pay

## 2018-12-08 ENCOUNTER — Telehealth: Payer: Self-pay | Admitting: Internal Medicine

## 2018-12-08 VITALS — BP 110/74 | HR 90 | Temp 98.2°F | Ht 74.0 in | Wt 392.2 lb

## 2018-12-08 DIAGNOSIS — E118 Type 2 diabetes mellitus with unspecified complications: Secondary | ICD-10-CM | POA: Diagnosis not present

## 2018-12-08 DIAGNOSIS — Z7984 Long term (current) use of oral hypoglycemic drugs: Secondary | ICD-10-CM

## 2018-12-08 DIAGNOSIS — Z6841 Body Mass Index (BMI) 40.0 and over, adult: Secondary | ICD-10-CM | POA: Diagnosis not present

## 2018-12-08 DIAGNOSIS — E11628 Type 2 diabetes mellitus with other skin complications: Secondary | ICD-10-CM | POA: Diagnosis not present

## 2018-12-08 DIAGNOSIS — Z79899 Other long term (current) drug therapy: Secondary | ICD-10-CM | POA: Diagnosis not present

## 2018-12-08 DIAGNOSIS — E1369 Other specified diabetes mellitus with other specified complication: Secondary | ICD-10-CM

## 2018-12-08 DIAGNOSIS — I10 Essential (primary) hypertension: Secondary | ICD-10-CM | POA: Diagnosis not present

## 2018-12-08 DIAGNOSIS — E669 Obesity, unspecified: Secondary | ICD-10-CM

## 2018-12-08 DIAGNOSIS — B3742 Candidal balanitis: Secondary | ICD-10-CM | POA: Diagnosis not present

## 2018-12-08 DIAGNOSIS — F329 Major depressive disorder, single episode, unspecified: Secondary | ICD-10-CM

## 2018-12-08 DIAGNOSIS — F419 Anxiety disorder, unspecified: Secondary | ICD-10-CM | POA: Diagnosis not present

## 2018-12-08 DIAGNOSIS — E1169 Type 2 diabetes mellitus with other specified complication: Secondary | ICD-10-CM | POA: Diagnosis not present

## 2018-12-08 DIAGNOSIS — R319 Hematuria, unspecified: Secondary | ICD-10-CM

## 2018-12-08 DIAGNOSIS — F32A Depression, unspecified: Secondary | ICD-10-CM

## 2018-12-08 LAB — POCT GLYCOSYLATED HEMOGLOBIN (HGB A1C): Hemoglobin A1C: 10.2 % — AB (ref 4.0–5.6)

## 2018-12-08 LAB — POCT URINALYSIS DIPSTICK
Bilirubin, UA: NEGATIVE
Glucose, UA: POSITIVE — AB
Protein, UA: NEGATIVE
Spec Grav, UA: 1.015 (ref 1.010–1.025)
Urobilinogen, UA: 0.2 E.U./dL
pH, UA: 6 (ref 5.0–8.0)

## 2018-12-08 LAB — GLUCOSE, CAPILLARY: Glucose-Capillary: 230 mg/dL — ABNORMAL HIGH (ref 70–99)

## 2018-12-08 MED ORDER — FLUCONAZOLE 150 MG PO TABS: 150.0000 mg | ORAL_TABLET | Freq: Once | ORAL | 0 refills | Status: DC

## 2018-12-08 MED ORDER — FLUCONAZOLE 100 MG PO TABS: 100.0000 mg | ORAL_TABLET | Freq: Once | ORAL | Status: DC

## 2018-12-08 MED ORDER — CLOTRIMAZOLE 1 % EX CREA: TOPICAL_CREAM | Freq: Two times a day (BID) | CUTANEOUS | Status: DC

## 2018-12-08 MED ORDER — CLOTRIMAZOLE-BETAMETHASONE 1-0.05 % EX CREA: 1.0000 "application " | TOPICAL_CREAM | Freq: Two times a day (BID) | CUTANEOUS | 0 refills | Status: DC

## 2018-12-08 NOTE — Assessment & Plan Note (Signed)
Today's Vitals   12/08/18 0927  BP: 110/74  Pulse: 90  Temp: 98.2 F (36.8 C)  TempSrc: Oral  SpO2: 97%  Weight: (!) 392 lb 3.2 oz (177.9 kg)  Height: 6\' 2"  (1.88 m)  PainSc: 10-Worst pain ever   Body mass index is 50.36 kg/m.   -pt with hypertension on amlodipine 5 mg, carvedilol 12.5 mg BID, Entresto 97-103 mg, spironolactone 25 mg daily,  torsemide 20 mg BID and metolazone 2.5 mg prn  -perfect BP today 110/74 -continue current medications

## 2018-12-08 NOTE — Assessment & Plan Note (Addendum)
-  patient on jardiance with uncontrolled DM with a1c of 10.2 here with complaint of penile itching and rawness with occasional yellow discharge with a red tint -tried an antifungal cream without improvement -wife has had yeast infections but not recently -patient has not had a prior yeast infection -denies dysuria  -on exam no appreciable erythema, patient unable to retract foreskin in the office, no visible discharge -likely yeast infection vs possible UTI -will get urinalysis and empirically treat for yeast with diflucan 150 mg x1 and clotrimazole betamethasone cream for 1-2 weeks -follow up in 4 weeks -patient instructed to call if sx did not improve within 1 week

## 2018-12-08 NOTE — Telephone Encounter (Signed)
Refill Request  fluconazole (DIFLUCAN) 150 MG tablet ketoconazole (NIZORAL) 2 % cream   Pt seen today in [place].  Pt states all of his medications [id number] [place] [address]  Not to [place].

## 2018-12-08 NOTE — Progress Notes (Signed)
   CC: penile itching  HPI:  Jonathan [name] is a 33 y.o. M here with one month of penile itching and for follow up of his chronic medical problems.   Past Medical History:  Diagnosis Date  . Acute pain of left foot 05/03/2017  . Asthma   . Asthma, chronic, mild persistent, uncomplicated 0/25/4270  . Charcot ankle, left 05/31/2017  . CHF (congestive heart failure) ([place])   . Chronic systolic (congestive) heart failure ([place])   . Diabetes mellitus type 2 in obese ([place]) 04/20/2016  . Diabetic polyneuropathy associated with type 2 diabetes mellitus ([place]) 03/14/2017  . Essential hypertension 10/26/2016  . Hypertension   . Obesity   . Obesity, Class III, BMI 40-49.9 (morbid obesity) ([place]) 10/26/2016  . OSA (obstructive sleep apnea) 12/02/2016  . Type 2 diabetes mellitus with diabetic neuropathy ([employer])    Review of Systems:    Review of Systems  Constitutional: Negative for chills, fever and weight loss.  Genitourinary: Positive for hematuria. Negative for dysuria.       Yellow penile discharge with red tint   Skin: Positive for itching and rash.  Psychiatric/Behavioral: Positive for depression. The patient is nervous/anxious.    Physical Exam:  Vitals:   12/08/18 0927  BP: 110/74  Pulse: 90  Temp: 98.2 F (36.8 C)  TempSrc: Oral  SpO2: 97%  Weight: (!) 392 lb 3.2 oz (177.9 kg)  Height: 6\' 2"  (1.88 m)   Physical Exam  Constitutional: He is oriented to person, place, and time and well-developed, well-nourished, and in no distress. No distress.  Obese male sitting in wheelchair   Neck: Normal range of motion.  Cardiovascular: Normal rate, regular rhythm and normal heart sounds.  No murmur heard. Pulmonary/Chest: Effort normal and breath sounds normal. No respiratory distress.  Abdominal: Soft. He exhibits no distension.  Genitourinary:    Penis normal.  No discharge found.    Genitourinary Comments: Uncircumcised    Neurological: He is alert and oriented to person, place, and  time.  Skin: He is not diaphoretic.   Assessment & Plan:   See Encounters Tab for problem based charting.  Patient seen with Dr. Lynnae January

## 2018-12-08 NOTE — Assessment & Plan Note (Signed)
Recent Results (from the past 2160 hour(s))  Glucose, capillary     Status: Abnormal   Collection Time: 12/08/18 10:23 AM  Result Value Ref Range   Glucose-Capillary 230 (H) 70 - 99 mg/dL  POC Hbg A1C     Status: Abnormal   Collection Time: 12/08/18 10:31 AM  Result Value Ref Range   Hemoglobin A1C 10.2 (A) 4.0 - 5.6 %   HbA1c POC (<> result, manual entry)     HbA1c, POC (prediabetic range)     HbA1c, POC (controlled diabetic range)    POCT Urinalysis Dipstick (64158)     Status: Abnormal   Collection Time: 12/08/18 10:40 AM  Result Value Ref Range   Color, UA yellow    Clarity, UA clear    Glucose, UA Positive (A) Negative   Bilirubin, UA negative    Ketones, UA Trace    Spec Grav, UA 1.015 1.010 - 1.025   Blood, UA trace-intact    pH, UA 6.0 5.0 - 8.0   Protein, UA Negative Negative   Urobilinogen, UA 0.2 0.2 or 1.0 E.U./dL   Nitrite, UA neagative    Leukocytes, UA Trace (A) Negative   Appearance     Odor     -pt with uncontrolled DM with a1c of 10.2 -on jardiance 25 daily, semaglutide 10 mg daily, januvia 100 mg daily -will not take metformin or insulin -discussed importance of diet and weight loss -patient reports increased stress lately which has resulted in increased eating -wants close follow up for weight loss assistance -follow up in 1 month with physician, Butch Penny and Miquel Dunn -if patient unable to achieve weight loss on his own, surgery may need to be discussed

## 2018-12-08 NOTE — Patient Instructions (Addendum)
Thank you for coming to your appointment. It was so nice to see you. Today we discussed  Diagnoses and all orders for this visit:  Other specified diabetes mellitus with other specified complication, unspecified whether long term insulin use (HCC) -     POC Hbg A1C -     POCT Urinalysis Dipstick (81002) -     Microalbumin / Creatinine Urine Ratio -     BMP8+Anion Gap -     Diet and exercise -     Follow up with Butch Penny -     Follow up with [place] in 1 month -     Follow up with counselor   Essential hypertension       -     Blood pressure is perfect. Keep up the good work!  Candidiasis of penis -     fluconazole (DIFLUCAN) 150 MG tablet; Take 1 tablet (150 mg total) by mouth once for 1 dose. -     Clotrimazole cream, apple 1-2 times per day for 1-2 weeks until symptoms resolve -     Call us back in 1 week if symptoms do not resolve  If labs were drawn today, I will call you with any abnormal results. Otherwise, please keep up the good work. I look forward to seeing you again soon at your follow up in 3 months. Please let us know if your symptoms do not improve.   Sincerely,  Al Decant, MD

## 2018-12-08 NOTE — Assessment & Plan Note (Signed)
Last Weight  Most recent update: 12/08/2018  9:30 AM   Weight  177.9 kg (392 lb 3.2 oz)              -pt with morbid obesity, most recent BMI 50.36 -has related comorbidities of uncontrolled diabetes and hypertension, a1c 10.2 -on jardiance 25 daily, semaglutide 10 mg daily, januvia 100 mg daily -will not take metformin or insulin -discussed importance of diet and weight loss -patient reports increased stress lately which has resulted in increased eating -wants close follow up for weight loss assistance -follow up in 1 month with physician, Butch Penny and Miquel Dunn -if patient unable to achieve weight loss on his own, surgery may need to be discussed

## 2018-12-08 NOTE — Progress Notes (Signed)
Internal Medicine Clinic Attending  I saw and evaluated the patient.  I personally confirmed the key portions of the history and exam documented by Dr. Lanier and I reviewed pertinent patient test results.  The assessment, diagnosis, and plan were formulated together and I agree with the documentation in the resident's note.   

## 2018-12-08 NOTE — Assessment & Plan Note (Signed)
-  patient reports increased stress lately due to loss of friends and family  -this has resulted in increased eating -interested in counseling  -follow up with Miquel Dunn

## 2018-12-08 NOTE — Telephone Encounter (Signed)
Both Rxs cancelled at [facility] and called to [place] at [place] removed from patient's record. Patient notified and is very Patent attorney. Hubbard Hartshorn, BSN, RN-BC

## 2018-12-09 LAB — BMP8+ANION GAP
Anion Gap: 18 mmol/L (ref 10.0–18.0)
BUN/Creatinine Ratio: 12 (ref 9–20)
BUN: 10 mg/dL (ref 6–20)
CO2: 21 mmol/L (ref 20–29)
Calcium: 10.1 mg/dL (ref 8.7–10.2)
Chloride: 99 mmol/L (ref 96–106)
Creatinine, Ser: 0.85 mg/dL (ref 0.76–1.27)
GFR calc Af Amer: 132 mL/min/{1.73_m2} (ref >59)
GFR calc non Af Amer: 114 mL/min/{1.73_m2} (ref >59)
Glucose: 246 mg/dL — ABNORMAL HIGH (ref 65–99)
Potassium: 4.7 mmol/L (ref 3.5–5.2)
Sodium: 138 mmol/L (ref 134–144)

## 2018-12-10 LAB — MICROALBUMIN / CREATININE URINE RATIO
Creatinine, Urine: 42.4 mg/dL
Microalb/Creat Ratio: <7 mg/g creat (ref 0–29)
Microalbumin, Urine: <3 ug/mL

## 2018-12-13 ENCOUNTER — Other Ambulatory Visit (HOSPITAL_COMMUNITY): Payer: Self-pay | Admitting: Internal Medicine

## 2018-12-27 ENCOUNTER — Other Ambulatory Visit (HOSPITAL_COMMUNITY): Payer: Self-pay | Admitting: Internal Medicine

## 2019-01-04 ENCOUNTER — Encounter: Payer: Self-pay | Admitting: Internal Medicine

## 2019-01-04 ENCOUNTER — Other Ambulatory Visit: Payer: Self-pay

## 2019-01-04 ENCOUNTER — Ambulatory Visit: Payer: BLUE CROSS/BLUE SHIELD | Admitting: Licensed Clinical Social Worker

## 2019-01-04 ENCOUNTER — Ambulatory Visit (INDEPENDENT_AMBULATORY_CARE_PROVIDER_SITE_OTHER): Payer: BLUE CROSS/BLUE SHIELD | Admitting: Internal Medicine

## 2019-01-04 DIAGNOSIS — Z872 Personal history of diseases of the skin and subcutaneous tissue: Secondary | ICD-10-CM | POA: Diagnosis not present

## 2019-01-04 DIAGNOSIS — E118 Type 2 diabetes mellitus with unspecified complications: Secondary | ICD-10-CM

## 2019-01-04 DIAGNOSIS — R300 Dysuria: Secondary | ICD-10-CM | POA: Diagnosis not present

## 2019-01-04 DIAGNOSIS — R319 Hematuria, unspecified: Secondary | ICD-10-CM

## 2019-01-04 DIAGNOSIS — E11610 Type 2 diabetes mellitus with diabetic neuropathic arthropathy: Secondary | ICD-10-CM

## 2019-01-04 DIAGNOSIS — B3742 Candidal balanitis: Secondary | ICD-10-CM | POA: Diagnosis not present

## 2019-01-04 MED ORDER — CLOTRIMAZOLE-BETAMETHASONE 1-0.05 % EX CREA: 1.0000 "application " | TOPICAL_CREAM | Freq: Two times a day (BID) | CUTANEOUS | 0 refills | Status: DC

## 2019-01-04 MED ORDER — FLUCONAZOLE 150 MG PO TABS: 150.0000 mg | ORAL_TABLET | Freq: Once | ORAL | 0 refills | Status: AC

## 2019-01-04 NOTE — Progress Notes (Signed)
CC: Recurrent Candidiasis of Penis  HPI:  Jonathan [name] is a 33 y.o., with a PMH noted below, presents with a recurrent candidiasis infection. To see the management of his acute and chronic conditions, please see the A&P note under the Encounters tab.   Past Medical History:  Diagnosis Date  . Acute pain of left foot 05/03/2017  . Asthma   . Asthma, chronic, mild persistent, uncomplicated 7/89/3810  . Charcot ankle, left 05/31/2017  . CHF (congestive heart failure) ([place])   . Chronic systolic (congestive) heart failure ([place])   . Diabetes mellitus type 2 in obese ([place]) 04/20/2016  . Diabetic polyneuropathy associated with type 2 diabetes mellitus ([place]) 03/14/2017  . Essential hypertension 10/26/2016  . Hypertension   . Obesity   . Obesity, Class III, BMI 40-49.9 (morbid obesity) ([place]) 10/26/2016  . OSA (obstructive sleep apnea) 12/02/2016  . Type 2 diabetes mellitus with diabetic neuropathy ([place])    Review of Systems:   Review of Systems  Constitutional: Positive for weight loss. Negative for chills, fever and malaise/fatigue.  Eyes: Negative for blurred vision and double vision.  Respiratory: Negative for cough and wheezing.   Cardiovascular: Negative for chest pain and palpitations.  Gastrointestinal: Negative for abdominal pain, constipation, diarrhea, heartburn, nausea and vomiting.  Genitourinary: Positive for dysuria and hematuria. Negative for frequency and urgency.       Patient endorses some pain on urination. Endorses red, inflamed skin. Endorses a yellow discharge.   Musculoskeletal: Negative for myalgias.  Neurological: Negative for dizziness and headaches.    Physical Exam:  Vitals:   01/04/19 0927  BP: 114/74  Pulse: 92  Temp: 98.8 F (37.1 C)  TempSrc: Oral  SpO2: 96%  Weight: (!) 385 lb 11.2 oz (175 kg)  Height: 6\' 2"  (1.88 m)   Physical Exam Constitutional:      General: He is not in acute distress.    Appearance: He is obese. He is not ill-appearing  or toxic-appearing.  HENT:     Head: Normocephalic and atraumatic.  Cardiovascular:     Rate and Rhythm: Normal rate and regular rhythm.     Pulses: Normal pulses.     Heart sounds: Normal heart sounds. No murmur. No friction rub. No gallop.   Pulmonary:     Effort: Pulmonary effort is normal.     Breath sounds: Normal breath sounds. No wheezing, rhonchi or rales.  Abdominal:     General: Bowel sounds are normal.     Tenderness: There is no abdominal tenderness. There is no guarding.  Genitourinary:    Penis: Normal.      Comments: Uncircumcised penis with no sign of erythema, lesions, or lymphadenopathy.  Musculoskeletal:     Right lower leg: No edema.     Left lower leg: No edema.     Comments: Patient with Charcot foot bilaterally, loss of sensation in the S1 distribution. Pulses palpable on examination.   Skin:    Findings: No bruising, erythema or lesion.     Comments: Skin at the base of his heels is dry on examination. No puncture or ulcerating wounds appreciated on examination of patients feet.   Neurological:     General: No focal deficit present.     Mental Status: He is alert and oriented to person, place, and time.  Psychiatric:        Mood and Affect: Mood normal.        Behavior: Behavior normal.     Assessment & Plan:  See Encounters Tab for problem based charting.  Patient seen with Dr. Dareen Piano

## 2019-01-04 NOTE — Assessment & Plan Note (Addendum)
Patient presents to the clinic for symptoms which mimic the symptoms of 12/08/2018. He states that his previous treatment cleared the infection, but he has noticed occasional dysuria, redness at the tip of the glans, and a yellow discharge, and swelling foreskin. Patient stated that he could not urinate during the visit today because he urinated right before coming into the office. Due to uncontrolled diabetes, recent candidiasis infection, we will treat him for candidiasis of the penis. Patient to call and schedule an appointment if his symptoms are not alleviated.  Plan: - Lotrisone cream ordered - Diflucan 150 mg  - Patient to reach out if symptoms do not abate. F/U U/A.

## 2019-01-04 NOTE — Patient Instructions (Signed)
Jonathan [name],  It was a pleasure meeting you today! Today we discussed the reoccurring penile infection. We will start you on an antifungal medication and cream as well. Please let us know if your symptoms continue to increase. We will see you in 3 months time to follow up on your blood sugars. Happy Thanksgiving and Holidays!  Sincerely,  [name]

## 2019-01-10 ENCOUNTER — Ambulatory Visit: Payer: BLUE CROSS/BLUE SHIELD | Admitting: Licensed Clinical Social Worker

## 2019-01-10 ENCOUNTER — Telehealth: Payer: Self-pay | Admitting: Licensed Clinical Social Worker

## 2019-01-10 NOTE — Telephone Encounter (Signed)
Patient called twice for his scheduled appointment. Did not answer, and no vm available.

## 2019-01-22 NOTE — Progress Notes (Signed)
Internal Medicine Clinic Attending  I saw and evaluated the patient.  I personally confirmed the key portions of the history and exam documented by Dr. Winters and I reviewed pertinent patient test results.  The assessment, diagnosis, and plan were formulated together and I agree with the documentation in the resident's note.  

## 2019-02-24 ENCOUNTER — Other Ambulatory Visit (HOSPITAL_COMMUNITY): Payer: Self-pay | Admitting: Internal Medicine

## 2019-02-26 ENCOUNTER — Ambulatory Visit (INDEPENDENT_AMBULATORY_CARE_PROVIDER_SITE_OTHER): Payer: 59 | Admitting: Internal Medicine

## 2019-02-26 ENCOUNTER — Telehealth: Payer: Self-pay | Admitting: Internal Medicine

## 2019-02-26 ENCOUNTER — Other Ambulatory Visit (HOSPITAL_COMMUNITY): Payer: Self-pay

## 2019-02-26 ENCOUNTER — Other Ambulatory Visit: Payer: Self-pay

## 2019-02-26 DIAGNOSIS — I5022 Chronic systolic (congestive) heart failure: Secondary | ICD-10-CM | POA: Diagnosis not present

## 2019-02-26 NOTE — Progress Notes (Signed)
   CC: Heart Failure  This is a telephone encounter between [employer] and [employer] on 02/26/2019 for Heart Failure. The visit was conducted with the patient located at home and [facility] at [facility]. The patient's identity was confirmed using their DOB and current address. The patient has consented to being evaluated through a telephone encounter and understands the associated risks (an examination cannot be done and the patient may need to come in for an appointment) / benefits (allows the patient to remain at home, decreasing exposure to coronavirus). I personally spent 9 minutes on medical discussion.   HPI:  Jonathan [name] is a 34 y.o. with PMH as below.   Please see A&P for assessment of the patient's acute and chronic medical conditions.   Past Medical History:  Diagnosis Date  . Acute pain of left foot 05/03/2017  . Asthma   . Asthma, chronic, mild persistent, uncomplicated 10/26/2016  . Charcot ankle, left 05/31/2017  . CHF (congestive heart failure) (HCC)   . Chronic systolic (congestive) heart failure (HCC)   . Diabetes mellitus type 2 in obese (HCC) 04/20/2016  . Diabetic polyneuropathy associated with type 2 diabetes mellitus (HCC) 03/14/2017  . Essential hypertension 10/26/2016  . Hypertension   . Obesity   . Obesity, Class III, BMI 40-49.9 (morbid obesity) (HCC) 10/26/2016  . OSA (obstructive sleep apnea) 12/02/2016  . Type 2 diabetes mellitus with diabetic neuropathy (HCC)    Review of Systems:  Performed and all others negative.  Assessment & Plan:   See Encounters Tab for problem based charting.  Patient discussed with Dr. Antony Contras

## 2019-02-26 NOTE — Telephone Encounter (Signed)
----- Message from Burns Spain, MD sent at 02/26/2019 10:09 AM EST ----- [name] I agree with you that we should not require the spouse to bc a patient to get FMLA papers filled out.  However, if this is the first time the PCP is filling out the form, I do believe it needs to be done in conjunction with the patient.  This would be either in in person appointment or a telehealth appointment.  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  I doubt 6 weeks is acceptable for the patient to wait to get FMLA.  Therefore, would you please schedule the patient in [facility] specifically to fill out FMLA? The patient can decide whether he wants an in person or telehealth appointment.  Thank you. ----- Message ----- From: Lianne Bushy Sent: 02/23/2019   9:30 AM EST To: Daisy Floro, #  Good morning,  Should a patient's spouse have to establish care with [place] to have a FMLA form filled out to care for her husband who is the patient?  If not could you please explain to Dr. Sande Brothers how he should go about filling out this form.  I appreciate your help.  Thanks,  [place] ----- Message ----- From: Dolan Amen, MD Sent: 02/23/2019   7:09 AM EST To: Lianne Bushy  Thank you for the clarification. I have looked at the documentation and the patient's chart, and spoken with the house staff. I was told that the wife will need to establish care to have this form filled. Please have his wife establish care in the [facility] and have the form filled out during her visit.  Thanks,  Dolan Amen ----- Message ----- From: Lianne Bushy Sent: 02/21/2019   3:53 PM EST To: Daisy Floro, #  I am sorry I did not make myself clear.  The wife is requesting FMLA/short term disability to care for her husband, Jonathan [name], due to a serious health condition.  You saw him last on 01/04/2019 and I believe she did accompany him  to that appt.  If you have time to come down and look the form over to see if you are able to complete it that would be great.  Thanks in advance,  [place] ----- Message ----- From: Dolan Amen, MD Sent: 02/21/2019  11:36 AM EST To: Jani Gravel Morning,  I am currently in the ICU this month. If the form is for the patient's wife she will need to be seen in the clinic as I have never evaluate this patient. She will need to follow up with her PCP or be seen in the [facility]. Please schedule an appointment. Thank you.  Sincerely,  Dolan Amen ----- Message ----- From: Lianne Bushy Sent: 02/21/2019  11:28 AM EST To: Laurel Dimmer, MD  Good morning,  You do not have any openings this month.  The paperwork has to be done within 2 weeks of being received.  It is for his wife.  Do you prefer him to be seen in [facility] or are you able to come down and look over the paperwork to see if it can be completed w/out an appt?  Please let me know.  Thanks,  [place] ----- Message ----- From: Dolan Amen, MD Sent: 02/19/2019   6:51 AM EST To: Lianne Bushy  To the [employer], I hope  you are well. I have been on vacation and just saw this email. Please schedule an appointment.  Sincerely,  [name], MD ----- Message ----- From: Meriam Sprague Sent: 02/13/2019   2:39 PM EST To: Ludwig Clarks, MD  Dear Dr  Your patient has requested the following form be completed : FMLA  Are you able to complete the form outside of an office appointment or do you need the pt sch for an appt?   Please respond within two business days.  [facility] Environmental manager

## 2019-02-26 NOTE — Progress Notes (Signed)
Internal Medicine Clinic Attending  Case discussed with Dr. Helberg at the time of the visit.  We reviewed the resident's history and exam and pertinent patient test results.  I agree with the assessment, diagnosis, and plan of care documented in the resident's note.    

## 2019-02-26 NOTE — Telephone Encounter (Signed)
Per Rogelia Boga telehealth or in person visit is needed to complete FMLA paperwork for patient's spouse.  Appointment is scheduled for today 02/26/2019 at 1:45 pm.

## 2019-02-26 NOTE — Assessment & Plan Note (Signed)
Patient calling in to request completion of FMLA paperwork for his wife. She serves as his primary caregiver. When discussing his ADLs he is independent for bathing, toileting, cooking, and walking. He does require assistance with getting dressed, primarily putting on his pants and getting out of bed in the morning. He has been collecting Social Security/disability since May 2020 due to his heart failure. His wife currently works at [employer] in [employer] and nutrition division. She is not had to miss any work to help care for him however she is needed anytime he goes to appointments and therefore may require some time off. According to our conversations this is the primary reason that she needs FMLA paperwork filled out (intermittent leave). I have completed the paperwork and it will be faxed over. All questions and concerns addressed.

## 2019-03-09 LAB — HM DIABETES EYE EXAM

## 2019-03-12 ENCOUNTER — Encounter: Payer: Self-pay | Admitting: *Deleted

## 2019-03-17 ENCOUNTER — Other Ambulatory Visit (HOSPITAL_COMMUNITY): Payer: Self-pay | Admitting: Internal Medicine

## 2019-04-02 ENCOUNTER — Encounter: Payer: Self-pay | Admitting: Internal Medicine

## 2019-04-02 ENCOUNTER — Other Ambulatory Visit: Payer: Self-pay | Admitting: *Deleted

## 2019-04-04 ENCOUNTER — Other Ambulatory Visit: Payer: Self-pay | Admitting: *Deleted

## 2019-04-04 ENCOUNTER — Other Ambulatory Visit: Payer: Self-pay | Admitting: Internal Medicine

## 2019-04-04 MED ORDER — SITAGLIPTIN PHOSPHATE 100 MG PO TABS: 100.0000 mg | ORAL_TABLET | Freq: Every day | ORAL | 1 refills | Status: DC

## 2019-04-04 MED ORDER — EMPAGLIFLOZIN 25 MG PO TABS: 25.0000 mg | ORAL_TABLET | Freq: Every day | ORAL | 1 refills | Status: DC

## 2019-04-09 ENCOUNTER — Ambulatory Visit (INDEPENDENT_AMBULATORY_CARE_PROVIDER_SITE_OTHER): Payer: 59 | Admitting: Internal Medicine

## 2019-04-09 ENCOUNTER — Other Ambulatory Visit (HOSPITAL_COMMUNITY): Payer: Self-pay | Admitting: *Deleted

## 2019-04-09 ENCOUNTER — Other Ambulatory Visit: Payer: Self-pay

## 2019-04-09 ENCOUNTER — Encounter: Payer: Self-pay | Admitting: Internal Medicine

## 2019-04-09 VITALS — BP 121/84 | HR 92 | Temp 98.2°F | Ht 74.0 in | Wt >= 6400 oz

## 2019-04-09 DIAGNOSIS — E1169 Type 2 diabetes mellitus with other specified complication: Secondary | ICD-10-CM | POA: Diagnosis not present

## 2019-04-09 DIAGNOSIS — I5022 Chronic systolic (congestive) heart failure: Secondary | ICD-10-CM

## 2019-04-09 DIAGNOSIS — Z7984 Long term (current) use of oral hypoglycemic drugs: Secondary | ICD-10-CM

## 2019-04-09 DIAGNOSIS — I428 Other cardiomyopathies: Secondary | ICD-10-CM

## 2019-04-09 DIAGNOSIS — E669 Obesity, unspecified: Secondary | ICD-10-CM | POA: Diagnosis not present

## 2019-04-09 DIAGNOSIS — Z6841 Body Mass Index (BMI) 40.0 and over, adult: Secondary | ICD-10-CM | POA: Diagnosis not present

## 2019-04-09 LAB — POCT GLYCOSYLATED HEMOGLOBIN (HGB A1C): Hemoglobin A1C: 10.4 % — AB (ref 4.0–5.6)

## 2019-04-09 LAB — BRAIN NATRIURETIC PEPTIDE: B Natriuretic Peptide: 486 pg/mL — ABNORMAL HIGH (ref 0.0–100.0)

## 2019-04-09 LAB — GLUCOSE, CAPILLARY: Glucose-Capillary: 164 mg/dL — ABNORMAL HIGH (ref 70–99)

## 2019-04-09 MED ORDER — METOLAZONE 2.5 MG PO TABS: 2.5000 mg | ORAL_TABLET | ORAL | 2 refills | Status: DC | PRN

## 2019-04-09 MED ORDER — TORSEMIDE 20 MG PO TABS: 20.0000 mg | ORAL_TABLET | Freq: Two times a day (BID) | ORAL | 3 refills | Status: DC

## 2019-04-09 NOTE — Progress Notes (Signed)
[facility]  Consult Note  Chief Complaint: DOE  History of Present Illness: Jonathan [name] is a 34 year old male with morbid obesity and NICM (LVEF 35-40%) who presented the clinic with progressive lower extremity edema and dyspnea on exertion of two months duration. He states that he has been out of his diuretic for approximately two months. Since that time he is noticed worsening dyspnea on exertion. Previous to this he was able to walk around his house and walk around outside without any significant shortness of breath. However over the last week he is not been able to walk more than 10 to 15 feet without having to stop to rest. In addition to this he is noticed significant lower extremity edema that is now above his knees and progressive orthopnea. Overall he says that his weight is up approximately 15 pounds from his reported dry weight. He has had similar presentations in the past where he required IV diuretics. He does not want to be admitted to the hospital for further therapy.  Given the above information, admission is typically warranted in order to provide IV lasix given its beneficial use over that of PO. However, as he is hemodynamically stable, has no significant lab derangements and has good support at home. He is deemed a potential candidate for the Heart Failure at home admission.  Past Medical History:  Diagnosis Date  . Acute pain of left foot 05/03/2017  . Asthma   . Asthma, chronic, mild persistent, uncomplicated 0/11/9321  . Charcot ankle, left 05/31/2017  . CHF (congestive heart failure) ([place])   . Chronic systolic (congestive) heart failure ([place])   . Diabetes mellitus type 2 in obese ([place]) 04/20/2016  . Diabetic polyneuropathy associated with type 2 diabetes mellitus ([place]) 03/14/2017  . Essential hypertension 10/26/2016  . Hypertension   . Obesity   . Obesity, Class III, BMI 40-49.9 (morbid obesity) ([place]) 10/26/2016  . OSA (obstructive sleep apnea) 12/02/2016  . Type  2 diabetes mellitus with diabetic neuropathy (HCC)    Meds:  Current Outpatient Medications on File Prior to Visit  Medication Sig Dispense Refill  . Amino Acids (L-CARNITINE PO) Take 1 tablet daily by mouth.    Marland Kitchen amLODipine (NORVASC) 5 MG tablet TAKE 1 TABLET (5 MG TOTAL) BY MOUTH DAILY. NEED APPT FOR FUTURE REFILLS [phone] 30 tablet 0  . blood glucose meter kit and supplies KIT Dispense based on patient and insurance preference. Use up to four times daily as directed. (FOR ICD-9 250.00, 250.01). For QAC - HS accuchecks. May switch to any brand. 1 each 1  . calcitonin, salmon, (MIACALCIN/FORTICAL) 200 UNIT/ACT nasal spray PLACE 1 SPRAY INTO ALTERNATE NOSTRILS DAILY 3.7 mL 0  . carvedilol (COREG) 25 MG tablet Take 0.5 tablets (12.5 mg total) by mouth 2 (two) times daily with a meal. 30 tablet 11  . cetirizine (ZYRTEC) 10 MG tablet Take 1 tablet (10 mg total) by mouth daily. 30 tablet 1  . clotrimazole-betamethasone (LOTRISONE) cream Apply 1 application topically 2 (two) times daily. For 1-2 weeks until symptoms resolve. 30 g 0  . empagliflozin (JARDIANCE) 25 MG TABS tablet Take 25 mg by mouth daily. 90 tablet 1  . ENTRESTO 97-103 MG TAKE 1 TABLET BY MOUTH TWICE A DAY 60 tablet 6  . gabapentin (NEURONTIN) 300 MG capsule Take 1 capsule (300 mg total) by mouth 3 (three) times daily. 90 capsule 3  . glucose blood (CONTOUR NEXT TEST) test strip Use as instructed 100 each 12  .  ketoconazole (NIZORAL) 2 % cream Apply 1 fingertip amount to each foot daily. 30 g 0  . Semaglutide (RYBELSUS) 3 MG TABS Take 3 mg by mouth daily. 30 tablet 0  . Semaglutide (RYBELSUS) 7 MG TABS Take 7 mg by mouth daily. 30 tablet 1  . sitaGLIPtin (JANUVIA) 100 MG tablet Take 1 tablet (100 mg total) by mouth daily. 90 tablet 1  . spironolactone (ALDACTONE) 25 MG tablet TAKE 1 TABLET BY MOUTH EVERY DAY 90 tablet 0  . tiotropium (SPIRIVA) 18 MCG inhalation capsule Place 18 mcg daily into inhaler and inhale.     No current  facility-administered medications on file prior to visit.   Physical Exam: Blood pressure 121/84, pulse 92, temperature 98.2 F (36.8 C), temperature source Oral, height 6' 2"  (1.88 m), weight (!) 401 lb 12.8 oz (182.3 kg), SpO2 96 %.  General: Morbidly obese male in no acute distress HENT: Normocephalic, atraumatic, moist mucus membranes Pulm: Good air movement with no wheezing or crackles  CV: RRR, no murmurs, no rubs  Abdomen: Active bowel sounds, soft, non-distended, no tenderness to palpation  Extremities: Pulses palpable in all extremities, 1-2+ BLE edema to the knees   Skin: Warm and dry  Neuro: Alert and oriented x 3  Clinical Screening: (ALL ANSWERS MUST BE NO)  Based on current presentation is the patient likely to require: advanced diagnostics, advanced imaging (CT, MRI, nuclear stress testing), cardiac catheterization, EGD/colonoscopy, or lab monitoring not amendable to home monitoring (troponin, >q12 hour labs): NO.  Based on current presentation is the patient is likely to require: mechanical ventilation (invasive and noninvasive, history of intubation) and/or vasoactive medications: NO.  Based on current presentation is the patient likely to require a surgical or IR procedure including but not limited to intraabdominal abscess drainage, percutaneous nephrostomy tube placement, thoracentesis for parapneumonic effusion, surgical wound debridement: NO.  Based on current presentation is the patient is likely to require: daily specialty consultation, blood transfusions, respiratory isolation/airborne precautions, active adjustments of opiates or IV pain medications: NO.  Does the patient have barriers that would make it unsafe to provide care in the home including but not limited to severe AMS, active substance use disorder, history of or high risk of noncompliance with primary treatment plan: NO.  Has the patient ever been intubated or do they have a new tracheostomy:  NO.  Does the patient have an unstable arrhythmia: NO.  Is hemodialysis likely to be required (i.e. already on HD or newly anuric/sever ATN): NO.  Is there risk for inability to obtain IV access: NO.]  Social Screening: (ALL QUESTIONS MUST BE YES) Does the patient have a home recovery environment? YES.  Is the patient's home recovery environment in an eligible geography [facility])? YES.  Does the patient's home have water, electricity, bathroom, heat/ac, refrigerator? YES.  Does the patient feel safe at home? YES.  Are family/caregivers willing to participate, as needed, while the patient participates [facility] [place].  Is there a person in the home (patient or other) willing/able to take vital signs and answer phone calls? YES.  Is the patient willing to put pets in a secure area while [facility] and affiliated staff are in the home? YES.  Is patient willing/able to participate in the [facility] (this includes [facility] affiliated staff entering the home, and associated services)? YES.  Is the patient/patient's HCP willing/able to sign consent? YES.  Assessment / Plan:  Based on the HPI and  information obtained the patient is a candidate for the [facility]. Consent has been signed and the patient has been provided with a copy.   [facility] has been notified and will present to the patient's house within 24 hours.   Home health / DME needs: None identified  Medication needs: None identified, PO furosemide refilled today.   Other needs: None identified.  Patient's contact information:  Phone: [phone] Address: [address] [address]  Please do not hesitate to call with questions/concerns.   Ina Homes, MD 04/09/2019, 5:01 PM  Pager: [phone]

## 2019-04-09 NOTE — Patient Instructions (Addendum)
Mr. Dupree,  It was a pleasure meeting you today. Today we discussed you fatigue and increased weight. Since you have been off your medications, you will be referred to the home health program for your heart failure. We will follow up in 2 months time. Be safe.  Sincerely,  Dolan Amen, MD

## 2019-04-10 ENCOUNTER — Other Ambulatory Visit: Payer: Self-pay

## 2019-04-10 ENCOUNTER — Encounter: Payer: Self-pay | Admitting: Internal Medicine

## 2019-04-10 NOTE — Assessment & Plan Note (Signed)
Patient with PMH of Chronic systolic heart failure presents to the [facility] 15 pounds overweight without having his IV diuretics for the past two months. Patient reports orthopnea, feels constant fatigue, and is unable to walk more than 20 feet without needing to rest. Patient refuses hospital admission.  - Patient referred to the Heart Failure at Home service.

## 2019-04-10 NOTE — Assessment & Plan Note (Signed)
Patient with current DMT2 presents with a A1c of 10.4 presents to the [facility]. Patient states that he is adherent to his home medications, but has been out of them until last week when he received refills.  - Continue Januvia 100 mg, rybelsus 7 mg, jardiance 25 mg - Will discuss medication changes at follow up. As patient has been out of medications for 2 months.

## 2019-04-11 ENCOUNTER — Telehealth (HOSPITAL_COMMUNITY): Payer: Self-pay | Admitting: Pharmacist

## 2019-04-11 NOTE — Telephone Encounter (Signed)
Patient Advocate Encounter   Received notification from [facility] that prior authorization for Sherryll Burger is required.   PA submitted via Fax Status is pending   Will continue to follow.  Karle Plumber, PharmD, BCPS, BCCP, CPP Heart Failure Clinic Pharmacist [phone]

## 2019-04-12 NOTE — Telephone Encounter (Signed)
Advanced Heart Failure Patient Advocate Encounter  Prior Authorization for Sherryll Burger has been approved.    Effective dates: 04/11/19 through 04/10/20  Karle Plumber, PharmD, BCPS, BCCP, CPP Heart Failure Clinic Pharmacist [phone]

## 2019-04-13 ENCOUNTER — Telehealth: Payer: Self-pay

## 2019-04-13 NOTE — Telephone Encounter (Signed)
Referral from [facility] for exercise program-online vs PREP in person at [facility].  Call placed to patient. He is agreeable to online exercise with gentle cardio/strength exercises and education  Will email link to participate. Reminded to work at his own pace and rest when required. Will cue during class as well.

## 2019-04-17 NOTE — Progress Notes (Addendum)
Internal Medicine Clinic Attending  I saw and evaluated the patient.  I personally confirmed the key portions of the history and exam documented by Dr. Winters and I reviewed pertinent patient test results.  The assessment, diagnosis, and plan were formulated together and I agree with the documentation in the resident's note.  

## 2019-04-19 ENCOUNTER — Ambulatory Visit (HOSPITAL_COMMUNITY)
Admission: RE | Admit: 2019-04-19 | Discharge: 2019-04-19 | Disposition: A | Discharge status: Home / Self Care | Payer: 59 | Source: Ambulatory Visit | Attending: Cardiology | Admitting: Cardiology

## 2019-04-19 ENCOUNTER — Other Ambulatory Visit: Payer: Self-pay

## 2019-04-19 ENCOUNTER — Encounter (HOSPITAL_COMMUNITY): Payer: Self-pay

## 2019-04-19 VITALS — BP 126/80 | HR 101 | Wt 382.5 lb

## 2019-04-19 DIAGNOSIS — Z8249 Family history of ischemic heart disease and other diseases of the circulatory system: Secondary | ICD-10-CM | POA: Diagnosis not present

## 2019-04-19 DIAGNOSIS — Z833 Family history of diabetes mellitus: Secondary | ICD-10-CM | POA: Insufficient documentation

## 2019-04-19 DIAGNOSIS — Z9119 Patient's noncompliance with other medical treatment and regimen: Secondary | ICD-10-CM | POA: Insufficient documentation

## 2019-04-19 DIAGNOSIS — I428 Other cardiomyopathies: Secondary | ICD-10-CM | POA: Diagnosis not present

## 2019-04-19 DIAGNOSIS — Z79899 Other long term (current) drug therapy: Secondary | ICD-10-CM | POA: Insufficient documentation

## 2019-04-19 DIAGNOSIS — I5022 Chronic systolic (congestive) heart failure: Secondary | ICD-10-CM | POA: Insufficient documentation

## 2019-04-19 DIAGNOSIS — Z841 Family history of disorders of kidney and ureter: Secondary | ICD-10-CM | POA: Diagnosis not present

## 2019-04-19 DIAGNOSIS — J45909 Unspecified asthma, uncomplicated: Secondary | ICD-10-CM | POA: Insufficient documentation

## 2019-04-19 DIAGNOSIS — E1142 Type 2 diabetes mellitus with diabetic polyneuropathy: Secondary | ICD-10-CM | POA: Diagnosis not present

## 2019-04-19 DIAGNOSIS — Z6841 Body Mass Index (BMI) 40.0 and over, adult: Secondary | ICD-10-CM | POA: Insufficient documentation

## 2019-04-19 DIAGNOSIS — Z7984 Long term (current) use of oral hypoglycemic drugs: Secondary | ICD-10-CM | POA: Insufficient documentation

## 2019-04-19 DIAGNOSIS — I110 Hypertensive heart disease with heart failure: Secondary | ICD-10-CM | POA: Insufficient documentation

## 2019-04-19 DIAGNOSIS — Z885 Allergy status to narcotic agent status: Secondary | ICD-10-CM | POA: Diagnosis not present

## 2019-04-19 DIAGNOSIS — E662 Morbid (severe) obesity with alveolar hypoventilation: Secondary | ICD-10-CM | POA: Insufficient documentation

## 2019-04-19 LAB — BASIC METABOLIC PANEL
Anion gap: 12 (ref 5–15)
BUN: 18 mg/dL (ref 6–20)
CO2: 25 mmol/L (ref 22–32)
Calcium: 9.8 mg/dL (ref 8.9–10.3)
Chloride: 100 mmol/L (ref 98–111)
Creatinine, Ser: 0.86 mg/dL (ref 0.61–1.24)
GFR calc Af Amer: >60 mL/min (ref >60)
GFR calc non Af Amer: >60 mL/min (ref >60)
Glucose, Bld: 193 mg/dL — ABNORMAL HIGH (ref 70–99)
Potassium: 4.5 mmol/L (ref 3.5–5.1)
Sodium: 137 mmol/L (ref 135–145)

## 2019-04-19 LAB — BRAIN NATRIURETIC PEPTIDE: B Natriuretic Peptide: 129.7 pg/mL — ABNORMAL HIGH (ref 0.0–100.0)

## 2019-04-19 NOTE — Progress Notes (Addendum)
Advanced Heart Failure Clinic Note   Referring Physician: PCP: [name], MD PCP-Cardiologist: No primary care provider on file.   HPI:   Jonathan [name] is a 34 y.o. AA male with history of systolic CHF due to NICM, HTN, DM 2, Asthma, OSA, and morbid obesity (Body mass index is 48.76 kg/m.)  Pt presented to [facility] 10/26/16 with progressive SOB x 2 weeks. He reports h/o systolic CHF with EF 61-95% by Echo in [place] in 2017. Cath around that time without any significant CAD per patient. He was diuresed with IV Lasix, discharge weight was 373 pounds. He had run out of most of his medications due to moving to [place] and not having insurance. Meds restarted at discharge: Coreg, Mearl Latin, Lasix.   He had repeat Echo 03/02/2017. EF had normalized to 55%.   He has a history of poor compliance.  He was previously being followed by paramedicine program.  He was lost to follow-up and not seen in the advanced heart failure clinic since 2019.  Also previously followed by Dr. Radford Pax for sleep apnea but also lost to follow-up.  Non compliant with CPAP.  Reports he cannot tolerate current facemask.  He has recently been followed by [facility] home [facility].  He was evaluated 2/22 after he developed progressive lower extremity edema and dyspnea on exertion after running out of his diuretic for approximately 2 months.  He was highly symptomatic with [place] heart association class IIIb symptoms.  Also complained of progressive orthopnea and PND and had gained over 15 pounds.  He was restarted on torsemide and continued on Entresto, carvedilol, spironolactone and Jardiance.  He presents to advanced heart failure clinic today for follow-up.  Symptoms markedly improved after restarting torsemide.  He reports that he has lost over 15 pounds.  Exercise tolerance is improved.  He can complete ADLs without exertional dyspnea.  Orthopnea/PND has resolved.  No resting dyspnea.  Denies chest pain.  Reports improved  compliance with medications.  Still noncompliant with CPAP.    When evaluated on 2/22 by remote home health, he weighed 401 pounds.  Weight today is 382 pounds.  Blood pressures well controlled 126/80.    Review of Systems: [y] = yes, [ ]  = no   General: Weight gain [ ] ; Weight loss [ ] ; Anorexia [ ] ; Fatigue [ ] ; Fever [ ] ; Chills [ ] ; Weakness [ ]   Cardiac: Chest pain/pressure [ ] ; Resting SOB [ ] ; Exertional SOB [ ] ; Orthopnea [ ] ; Pedal Edema [ ] ; Palpitations [ ] ; Syncope [ ] ; Presyncope [ ] ; Paroxysmal nocturnal dyspnea[ ]   Pulmonary: Cough [ ] ; Wheezing[ ] ; Hemoptysis[ ] ; Sputum [ ] ; Snoring [ ]   GI: Vomiting[ ] ; Dysphagia[ ] ; Melena[ ] ; Hematochezia [ ] ; Heartburn[ ] ; Abdominal pain [ ] ; Constipation [ ] ; Diarrhea [ ] ; BRBPR [ ]   GU: Hematuria[ ] ; Dysuria [ ] ; Nocturia[ ]   Vascular: Pain in legs with walking [ ] ; Pain in feet with lying flat [ ] ; Non-healing sores [ ] ; Stroke [ ] ; TIA [ ] ; Slurred speech [ ] ;  Neuro: Headaches[ ] ; Vertigo[ ] ; Seizures[ ] ; Paresthesias[ ] ;Blurred vision [ ] ; Diplopia [ ] ; Vision changes [ ]   Ortho/Skin: Arthritis [ ] ; Joint pain [ ] ; Muscle pain [ ] ; Joint swelling [ ] ; Back Pain [ ] ; Rash [ ]   Psych: Depression[ ] ; Anxiety[ ]   Heme: Bleeding problems [ ] ; Clotting disorders [ ] ; Anemia [ ]   Endocrine: Diabetes [ ] ; Thyroid dysfunction[ ]    Past Medical  History:  Diagnosis Date  . Acute pain of left foot 05/03/2017  . Asthma   . Asthma, chronic, mild persistent, uncomplicated 05/11/7122  . Charcot ankle, left 05/31/2017  . CHF (congestive heart failure) ([place])   . Chronic systolic (congestive) heart failure ([place])   . Diabetes mellitus type 2 in obese ([place]) 04/20/2016  . Diabetic polyneuropathy associated with type 2 diabetes mellitus ([place]) 03/14/2017  . Essential hypertension 10/26/2016  . Hypertension   . Obesity   . Obesity, Class III, BMI 40-49.9 (morbid obesity) ([place]) 10/26/2016  . OSA (obstructive sleep apnea) 12/02/2016  . Type 2 diabetes  mellitus with diabetic neuropathy (HCC)     Current Outpatient Medications  Medication Sig Dispense Refill  . Amino Acids (L-CARNITINE PO) Take 1 tablet daily by mouth.    Marland Kitchen amLODipine (NORVASC) 5 MG tablet TAKE 1 TABLET (5 MG TOTAL) BY MOUTH DAILY. NEED APPT FOR FUTURE REFILLS [id number] 30 tablet 0  . blood glucose meter kit and supplies KIT Dispense based on patient and insurance preference. Use up to four times daily as directed. (FOR ICD-9 250.00, 250.01). For QAC - HS accuchecks. May switch to any brand. 1 each 1  . calcitonin, salmon, (MIACALCIN/FORTICAL) 200 UNIT/ACT nasal spray PLACE 1 SPRAY INTO ALTERNATE NOSTRILS DAILY 3.7 mL 0  . carvedilol (COREG) 25 MG tablet Take 0.5 tablets (12.5 mg total) by mouth 2 (two) times daily with a meal. 30 tablet 11  . cetirizine (ZYRTEC) 10 MG tablet Take 1 tablet (10 mg total) by mouth daily. 30 tablet 1  . clotrimazole-betamethasone (LOTRISONE) cream Apply 1 application topically 2 (two) times daily. For 1-2 weeks until symptoms resolve. 30 g 0  . empagliflozin (JARDIANCE) 25 MG TABS tablet Take 25 mg by mouth daily. 90 tablet 1  . ENTRESTO 97-103 MG TAKE 1 TABLET BY MOUTH TWICE A DAY 60 tablet 6  . gabapentin (NEURONTIN) 300 MG capsule Take 1 capsule (300 mg total) by mouth 3 (three) times daily. 90 capsule 3  . glucose blood (CONTOUR NEXT TEST) test strip Use as instructed 100 each 12  . ketoconazole (NIZORAL) 2 % cream Apply 1 fingertip amount to each foot daily. 30 g 0  . metolazone (ZAROXOLYN) 2.5 MG tablet Take 1 tablet (2.5 mg total) by mouth as needed. As directed by HF clinic for weight gain of 3 lbs overnight or 5 lbs within one week. 10 tablet 2  . Semaglutide (RYBELSUS) 7 MG TABS Take 7 mg by mouth daily. 30 tablet 1  . sitaGLIPtin (JANUVIA) 100 MG tablet Take 1 tablet (100 mg total) by mouth daily. 90 tablet 1  . spironolactone (ALDACTONE) 25 MG tablet TAKE 1 TABLET BY MOUTH EVERY DAY 90 tablet 0  . tiotropium (SPIRIVA) 18 MCG  inhalation capsule Place 18 mcg daily into inhaler and inhale.    . torsemide (DEMADEX) 20 MG tablet Take 1 tablet (20 mg total) by mouth 2 (two) times daily. 180 tablet 3   No current facility-administered medications for this encounter.    Allergies  Allergen Reactions  . Hydrocodone     Hives       Social History   Socioeconomic History  . Marital status: Married    Spouse name: Not on file  . Number of children: Not on file  . Years of education: Not on file  . Highest education level: Not on file  Occupational History  . Not on file  Tobacco Use  . Smoking status: Never Smoker  . Smokeless  tobacco: Never Used  Substance and Sexual Activity  . Alcohol use: No  . Drug use: No  . Sexual activity: Never  Other Topics Concern  . Not on file  Social History Narrative  . Not on file   Social Determinants of Health   Financial Resource Strain:   . Difficulty of Paying Living Expenses: Not on file  Food Insecurity:   . Worried About Charity fundraiser in the Last Year: Not on file  . Ran Out of Food in the Last Year: Not on file  Transportation Needs:   . Lack of Transportation (Medical): Not on file  . Lack of Transportation (Non-Medical): Not on file  Physical Activity:   . Days of Exercise per Week: Not on file  . Minutes of Exercise per Session: Not on file  Stress:   . Feeling of Stress : Not on file  Social Connections:   . Frequency of Communication with Friends and Family: Not on file  . Frequency of Social Gatherings with Friends and Family: Not on file  . Attends Religious Services: Not on file  . Active Member of Clubs or Organizations: Not on file  . Attends Archivist Meetings: Not on file  . Marital Status: Not on file  Intimate Partner Violence:   . Fear of Current or Ex-Partner: Not on file  . Emotionally Abused: Not on file  . Physically Abused: Not on file  . Sexually Abused: Not on file      Family History  Problem Relation  Age of Onset  . Diabetes Mellitus II Mother   . Heart failure Mother   . Hypertension Mother   . Heart disease Mother   . Heart failure Father   . Kidney disease Father   . Heart disease Father   . Congestive Heart Failure Neg Hx     Vitals:   04/19/19 1152  BP: 126/80  Pulse: (!) 101  SpO2: 97%  Weight: (!) 173.5 kg (382 lb 8 oz)     PHYSICAL EXAM: General: Morbidly obese, young African-American male  No respiratory difficulty HEENT: normal Neck: supple. no JVD. Carotids 2+ bilat; no bruits. No lymphadenopathy or thyromegaly appreciated. Cor: PMI nondisplaced. Regular rhythm, slightly tacky rate. No rubs, gallops or murmurs. Lungs: clear Abdomen: Obese, soft, nontender, nondistended. No hepatosplenomegaly. No bruits or masses. Good bowel sounds. Extremities: no cyanosis, clubbing, rash, trace ankle edema, right greater than left Neuro: alert & oriented x 3, cranial nerves grossly intact. moves all 4 extremities w/o difficulty. Affect pleasant.  ECG: not performed    ASSESSMENT & PLAN:  1. Chronic systolic CHF due to NICM: normal cors by cath in [place] in 2017. Echo 10/2016 EF 30-35%.  - Echo 02/2017 LVEF 55% with complete recovery.  -Recent acute exacerbation with NYHA IIIb symptoms in the setting of medication noncompliance.  Volume status and symptoms markedly improved after restart of torsemide.  He has diuresed over 20 pounds in the last 2 weeks down from 401 pounds to 382 pounds today. -Now NYHA class II - Volume status difficult to assess due to morbid obesity but appears euvolemic.  Will check BNP to help better assess - For now continue torsemide 20 mg BID. - Continue Entresto to 97/103 mg BID.  - Increase Coreg to 25 mg BID for elevated heart rate - Continue spiro 25 mg daily.  - Continue Jardiance 25 mg daily  - Check basic metabolic panel today - Will update echo to see is any  change in LVEF - Reinforced fluid restriction to < 2 L daily, sodium restriction to less  than 2000 mg daily, and the importance of daily weights.    -Discussed the importance of strict medication compliance -Appreciate [facility]'s assistance  2. HTN -Controlled on current regimen.  Will increase carvedilol to 25 mg twice daily per above  3. DM2  -Management per PCP -Continue Jardiance  4. Morbid Obesity - Body mass index is 48.69 kg/m. -Weight loss advised  5. OSA/OHS - Sleep study +.  Difficulties tolerating CPAP due to current face mass.  He is interested in alternative face apparatus - refer back to sleep clinic, Dr. Radford Pax   6. Charcot's foot - Left - History of surgical reconstruction -Followed by Ortho  7. Noncompliance -Discussed importance of strict medication compliance -He is currently being followed closely by remote [facility] -We will try to reenroll in our Medicine program  F/u in 6 weeks   Byan Poplaski Rosita Fire, PA-C 04/19/19

## 2019-04-19 NOTE — Patient Instructions (Signed)
Lab work done today. We will notify you of any abnormal lab work. No news is good news!  Your physician has requested that you have an echocardiogram. Echocardiography is a painless test that uses sound waves to create images of your heart. It provides your doctor with information about the size and shape of your heart and how well your heart's chambers and valves are working. This procedure takes approximately one hour. There are no restrictions for this procedure.  Please follow up with the Advanced Heart Failure Clinic in 8 weeks.  At the Advanced Heart Failure Clinic, you and your health needs are our priority. As part of our continuing mission to provide you with exceptional heart care, we have created designated Provider Care Teams. These Care Teams include your primary Cardiologist (physician) and Advanced Practice Providers (APPs- Physician Assistants and Nurse Practitioners) who all work together to provide you with the care you need, when you need it.   You may see any of the following providers on your designated Care Team at your next follow up: Marland Kitchen Dr Arvilla Meres . Dr Marca Ancona . Tonye Becket, NP . Robbie Lis, PA . Karle Plumber, PharmD   Please be sure to bring in all your medications bottles to every appointment.

## 2019-04-24 ENCOUNTER — Telehealth (HOSPITAL_COMMUNITY): Payer: Self-pay

## 2019-04-24 MED ORDER — ENTRESTO 97-103 MG PO TABS: 1.0000 | ORAL_TABLET | Freq: Two times a day (BID) | ORAL | 6 refills | Status: DC

## 2019-04-24 MED ORDER — AMLODIPINE BESYLATE 5 MG PO TABS: 5.0000 mg | ORAL_TABLET | Freq: Every day | ORAL | 0 refills | Status: DC

## 2019-04-24 MED ORDER — SPIRONOLACTONE 25 MG PO TABS: 25.0000 mg | ORAL_TABLET | Freq: Every day | ORAL | 0 refills | Status: DC

## 2019-04-24 MED ORDER — EMPAGLIFLOZIN 25 MG PO TABS: 25.0000 mg | ORAL_TABLET | Freq: Every day | ORAL | 1 refills | Status: DC

## 2019-04-24 MED ORDER — TORSEMIDE 20 MG PO TABS: 20.0000 mg | ORAL_TABLET | Freq: Two times a day (BID) | ORAL | 3 refills | Status: DC

## 2019-04-24 MED ORDER — CARVEDILOL 25 MG PO TABS: 25.0000 mg | ORAL_TABLET | Freq: Two times a day (BID) | ORAL | 3 refills | Status: DC

## 2019-04-24 NOTE — Telephone Encounter (Signed)
Called and spoke with pt to make medication changes. Referral made. hf meds refilled. No further question.

## 2019-04-24 NOTE — Telephone Encounter (Signed)
-----   Message from Allayne Butcher, [place] sent at 04/19/2019  8:54 PM EST ----- Labs stable.  He should increase Coreg to 25 mg bid to better control HR. Also refer back to sleep clinic. Was being followed by Dr. Mayford Knife but lost to f/u.

## 2019-05-02 ENCOUNTER — Telehealth (HOSPITAL_COMMUNITY): Payer: Self-pay | Admitting: *Deleted

## 2019-05-02 NOTE — Telephone Encounter (Signed)
Jonathan [name] with [facility] called to report pts weight gain of 8lbs since 3/12. Pt took metolazone yesterday and today. [name] thinks he took a dose last week as well. [name] will have bmet drawn tomorrow and sent to our office pt scheduled for echo Friday and will see triage nurse after echo. Waiting for lab results before making any medication changes. Jonathan also instructed pt to only take metolazone as directed by our clinic.  Routed to [place], [place]

## 2019-05-04 ENCOUNTER — Ambulatory Visit (HOSPITAL_COMMUNITY)
Admission: RE | Admit: 2019-05-04 | Discharge: 2019-05-04 | Disposition: A | Discharge status: Home / Self Care | Payer: 59 | Source: Ambulatory Visit | Attending: Orthopedic Surgery | Admitting: Orthopedic Surgery

## 2019-05-04 ENCOUNTER — Other Ambulatory Visit: Payer: Self-pay

## 2019-05-04 VITALS — BP 144/96 | HR 93 | Wt 383.0 lb

## 2019-05-04 DIAGNOSIS — I110 Hypertensive heart disease with heart failure: Secondary | ICD-10-CM | POA: Insufficient documentation

## 2019-05-04 DIAGNOSIS — I5022 Chronic systolic (congestive) heart failure: Secondary | ICD-10-CM | POA: Diagnosis not present

## 2019-05-04 DIAGNOSIS — Z7901 Long term (current) use of anticoagulants: Secondary | ICD-10-CM | POA: Insufficient documentation

## 2019-05-04 DIAGNOSIS — E119 Type 2 diabetes mellitus without complications: Secondary | ICD-10-CM | POA: Insufficient documentation

## 2019-05-04 LAB — BASIC METABOLIC PANEL
Anion gap: 15 (ref 5–15)
BUN: 29 mg/dL — ABNORMAL HIGH (ref 6–20)
CO2: 28 mmol/L (ref 22–32)
Calcium: 9.7 mg/dL (ref 8.9–10.3)
Chloride: 91 mmol/L — ABNORMAL LOW (ref 98–111)
Creatinine, Ser: 1.31 mg/dL — ABNORMAL HIGH (ref 0.61–1.24)
GFR calc Af Amer: >60 mL/min (ref >60)
GFR calc non Af Amer: >60 mL/min (ref >60)
Glucose, Bld: 238 mg/dL — ABNORMAL HIGH (ref 70–99)
Potassium: 5 mmol/L (ref 3.5–5.1)
Sodium: 134 mmol/L — ABNORMAL LOW (ref 135–145)

## 2019-05-04 LAB — BRAIN NATRIURETIC PEPTIDE: B Natriuretic Peptide: 81 pg/mL (ref 0.0–100.0)

## 2019-05-04 NOTE — Progress Notes (Signed)
  Echocardiogram 2D Echocardiogram has been performed.  Janalyn Harder 05/04/2019, 11:01 AM

## 2019-05-04 NOTE — Progress Notes (Signed)
Patient presents to office for labs  Reports he has taken a weekly dose of metolazone for the past two weeks despite calling to report weight gain Reports weight has fluctuated ([phone] on home scale) Reports increased swelling all over Denies  CP, dizziness, SOB, no changes in lying comfortable at night as patient always sleeps propped up.  Reports compliance with torsemide 20 BID, spiro 25 daily, entresto 97/103  Vitals taken and labs ordered  Will forward to provider to review with labs

## 2019-05-07 ENCOUNTER — Other Ambulatory Visit (HOSPITAL_COMMUNITY): Payer: Self-pay | Admitting: Internal Medicine

## 2019-05-10 ENCOUNTER — Telehealth (HOSPITAL_COMMUNITY): Payer: Self-pay | Admitting: Cardiology

## 2019-05-10 DIAGNOSIS — I5022 Chronic systolic (congestive) heart failure: Secondary | ICD-10-CM

## 2019-05-10 MED ORDER — TORSEMIDE 20 MG PO TABS: 20.0000 mg | ORAL_TABLET | Freq: Every day | ORAL | 3 refills | Status: DC

## 2019-05-10 NOTE — Telephone Encounter (Signed)
-----   Message from [place], [place] sent at 05/07/2019  4:53 PM EDT ----- Bump in SCr. K upper limits of normal. Recommend he reduce torsemide down to once daily. Follow wt closely, can take evening dose of torsemide based on daily wts. If > 3 lb wt gain in 24 hrs take 20 mg bid. If wt stable, just 20 mg once daily. Repeat BMP in 7 days to ensure SCr has stabilized.

## 2019-05-22 ENCOUNTER — Other Ambulatory Visit: Payer: Self-pay

## 2019-05-22 ENCOUNTER — Ambulatory Visit (HOSPITAL_COMMUNITY)
Admission: RE | Admit: 2019-05-22 | Discharge: 2019-05-22 | Disposition: A | Discharge status: Home / Self Care | Payer: 59 | Source: Ambulatory Visit | Attending: Cardiology | Admitting: Cardiology

## 2019-05-22 DIAGNOSIS — I5022 Chronic systolic (congestive) heart failure: Secondary | ICD-10-CM | POA: Diagnosis not present

## 2019-05-22 LAB — BASIC METABOLIC PANEL
Anion gap: 13 (ref 5–15)
BUN: 10 mg/dL (ref 6–20)
CO2: 23 mmol/L (ref 22–32)
Calcium: 9.7 mg/dL (ref 8.9–10.3)
Chloride: 100 mmol/L (ref 98–111)
Creatinine, Ser: 0.89 mg/dL (ref 0.61–1.24)
GFR calc Af Amer: >60 mL/min (ref >60)
GFR calc non Af Amer: >60 mL/min (ref >60)
Glucose, Bld: 212 mg/dL — ABNORMAL HIGH (ref 70–99)
Potassium: 4.5 mmol/L (ref 3.5–5.1)
Sodium: 136 mmol/L (ref 135–145)

## 2019-06-16 IMAGING — CR DG CHEST 2V
2 series · 2 of 2 positions shown · non-contrast
Comparison: 04/19/2016

CLINICAL DATA: Dyspnea and lower leg edema times 2-3 weeks.

EXAM:
CHEST  2 VIEW

[chest pa]
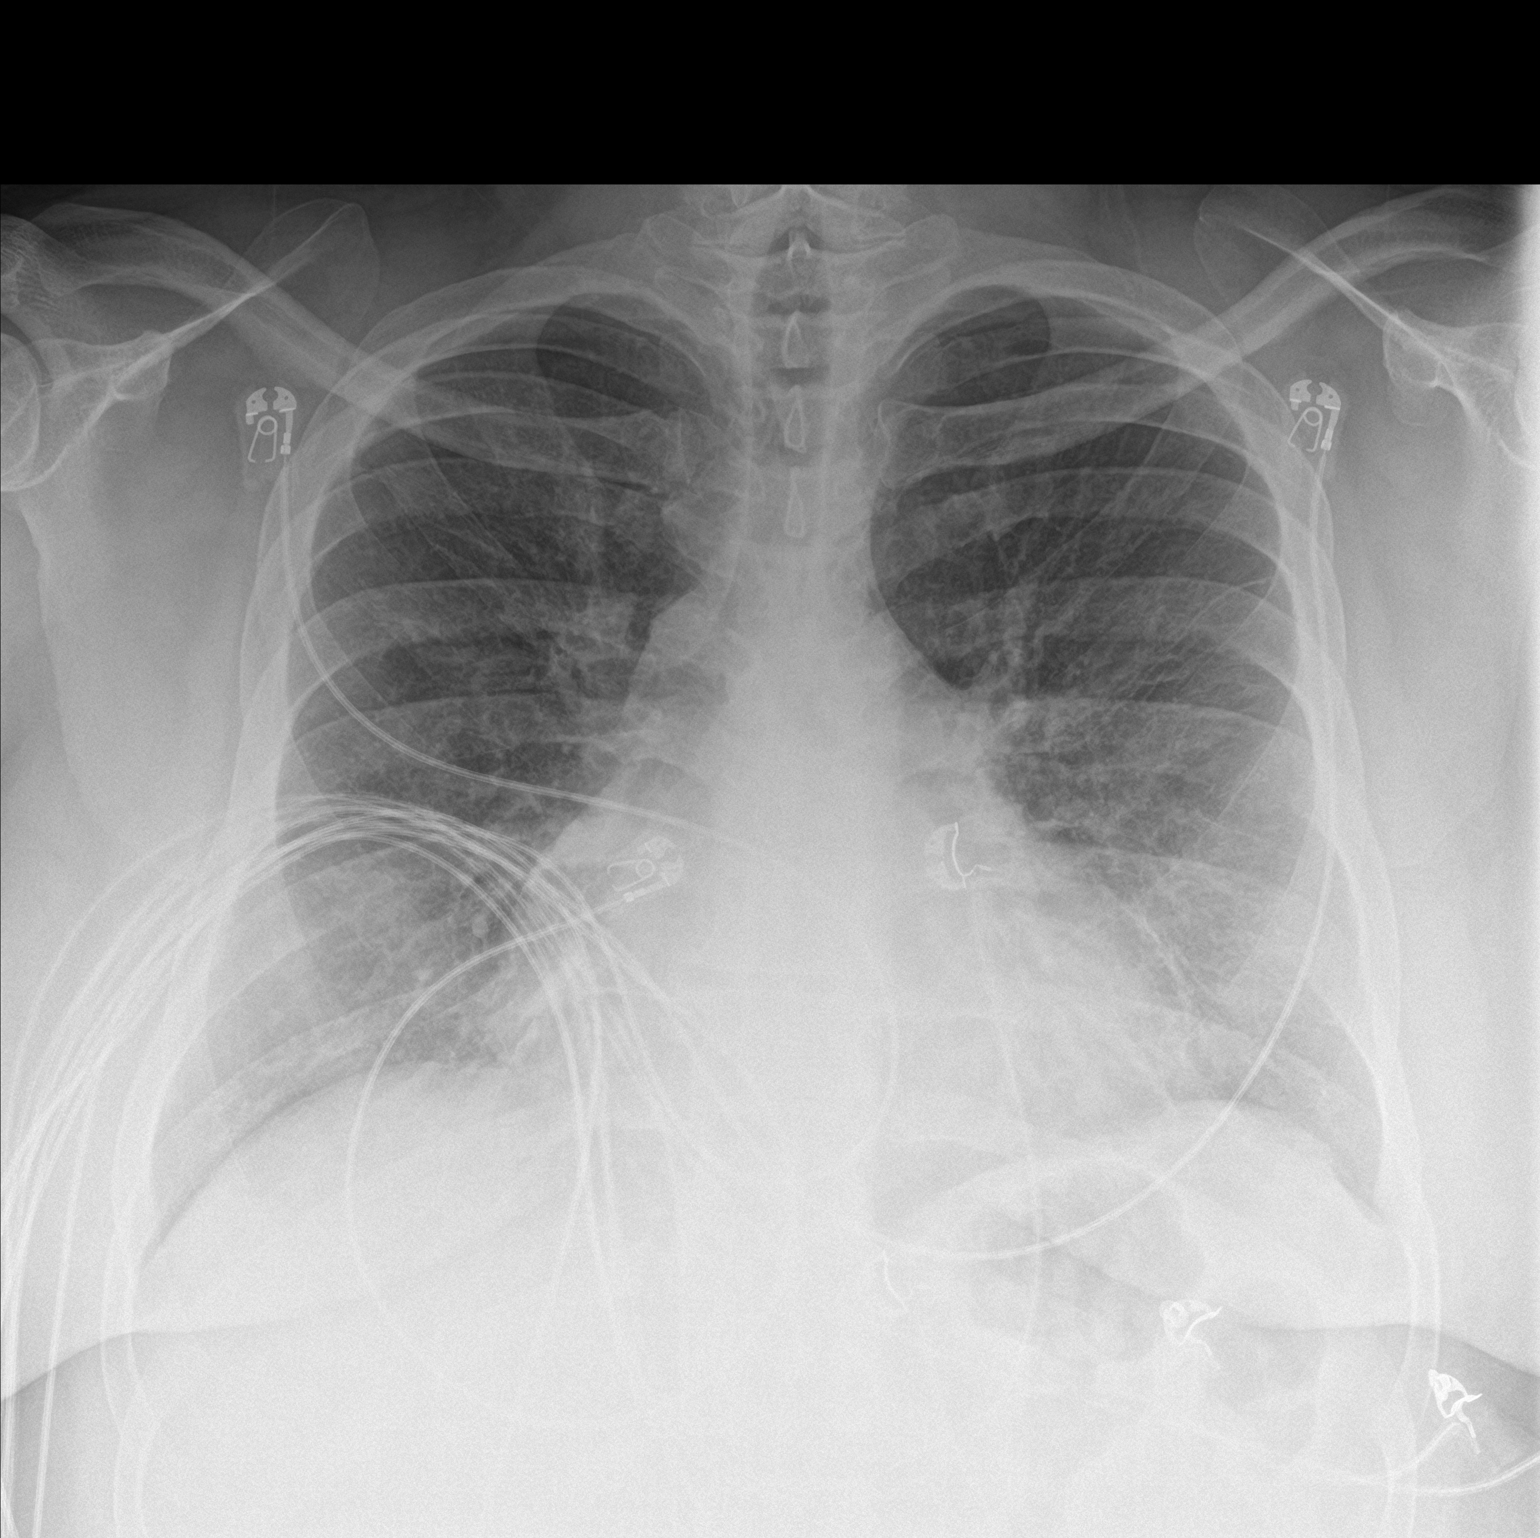

[chest lat]
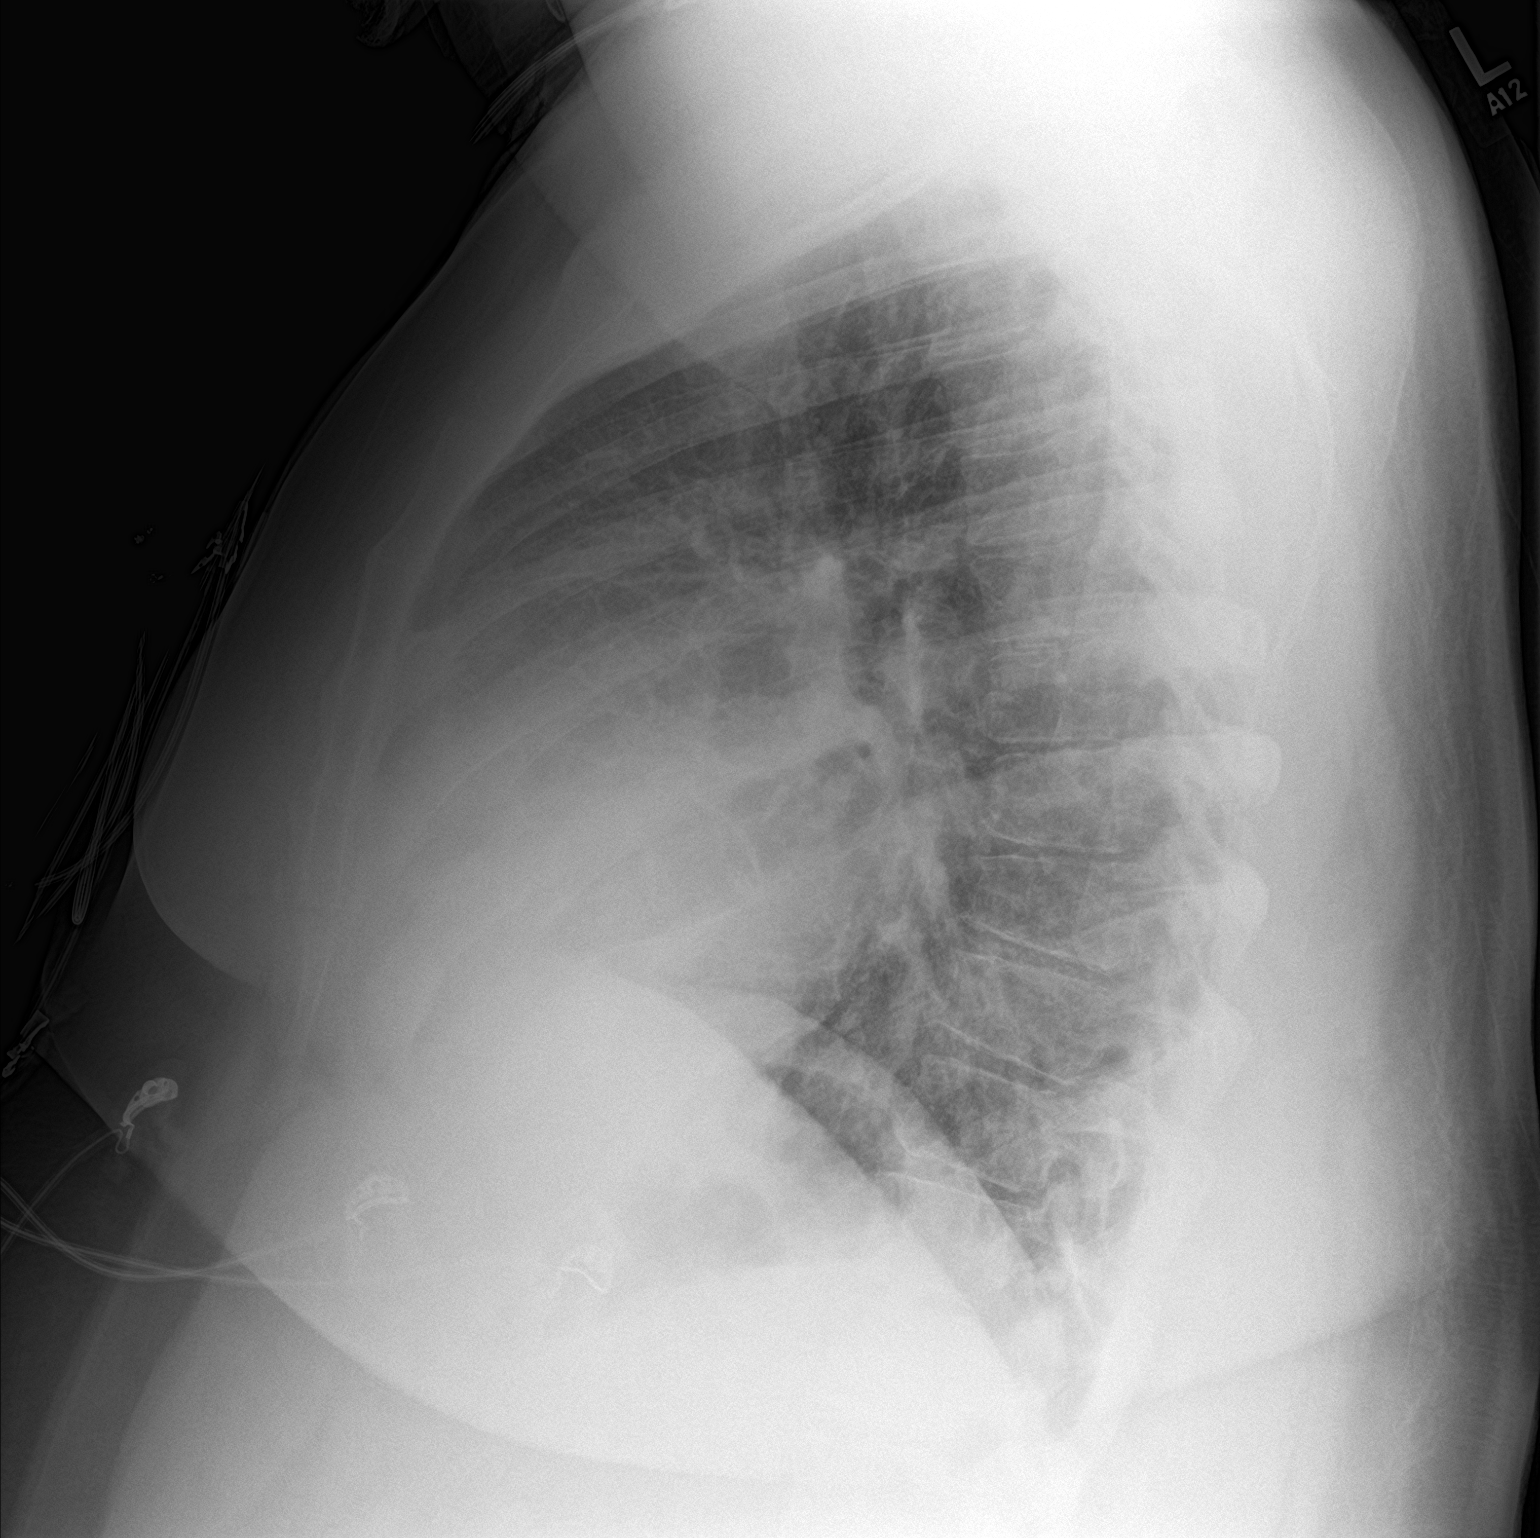

[2 of 2 positions shown; findings below may reference images not displayed]

FINDINGS: Stable cardiomegaly. Bilateral interstitial prominence may reflect
mild interstitial edema. No effusion or pneumothorax. No acute nor
suspicious osseous abnormality.
IMPRESSION: Cardiomegaly with mild interstitial edema.

## 2019-06-22 ENCOUNTER — Encounter (HOSPITAL_COMMUNITY): Payer: 59 | Admitting: Internal Medicine

## 2019-06-28 ENCOUNTER — Other Ambulatory Visit: Payer: Self-pay | Admitting: *Deleted

## 2019-06-28 ENCOUNTER — Other Ambulatory Visit (HOSPITAL_COMMUNITY): Payer: Self-pay | Admitting: *Deleted

## 2019-06-28 ENCOUNTER — Encounter (HOSPITAL_COMMUNITY): Payer: Self-pay

## 2019-06-28 MED ORDER — CARVEDILOL 25 MG PO TABS: 25.0000 mg | ORAL_TABLET | Freq: Two times a day (BID) | ORAL | 3 refills | Status: DC

## 2019-06-28 MED ORDER — EMPAGLIFLOZIN 25 MG PO TABS: 25.0000 mg | ORAL_TABLET | Freq: Every day | ORAL | 1 refills | Status: DC

## 2019-06-28 MED ORDER — AMLODIPINE BESYLATE 5 MG PO TABS: 5.0000 mg | ORAL_TABLET | Freq: Every day | ORAL | 1 refills | Status: DC

## 2019-06-28 MED ORDER — SPIRONOLACTONE 25 MG PO TABS: 25.0000 mg | ORAL_TABLET | Freq: Every day | ORAL | 3 refills | Status: DC

## 2019-07-02 MED ORDER — METOLAZONE 2.5 MG PO TABS: 2.5000 mg | ORAL_TABLET | ORAL | 2 refills | Status: DC | PRN

## 2019-07-02 NOTE — Progress Notes (Signed)
Advanced Heart Failure Clinic Note   PCP: [name], MD PCP-Cardiologist: No primary care provider on file.   HPI:  Jonathan [name] is a 34 y.o. AA male with history of systolic CHF due to NICM, HTN, DM 2, Asthma, OSA, and morbid obesity (Body mass index is 48.76 kg/m.)  Pt presented to [facility] 10/26/16 with progressive SOB x 2 weeks. He reports h/o systolic CHF with EF 85-27% by Echo in [place] in 2017. Cath around that time without any significant CAD per patient. He was diuresed with IV Lasix, discharge weight was 373 pounds. He had run out of most of his medications due to moving to [place] and not having insurance. Meds restarted at discharge: Coreg, Mearl Latin, Lasix.   He had repeat Echo 03/02/2017. EF had normalized to 55%.   He has a history of poor compliance.  He was previously being followed by paramedicine program.  He was lost to follow-up and not seen in the advanced heart failure clinic since 2019.  Also previously followed by Dr. Radford Pax for sleep apnea but also lost to follow-up.  Non compliant with CPAP.  Reports he cannot tolerate current facemask.  He has recently been followed by remote home health.  He was evaluated 2/22 after he developed progressive lower extremity edema and dyspnea on exertion after running out of his diuretic for approximately 2 months.  He was highly symptomatic with [place] heart association class IIIb symptoms.  Also complained of progressive orthopnea and PND and had gained over 15 pounds.  He was restarted on torsemide and continued on Entresto, carvedilol, spironolactone and Jardiance.  Today he returns for HF follow up.Overall feeling fine. SOB with exertion. Says he has to take breaks when he walks.  Denies PND/Orthopnea. SBP at home 120/90.Appetite ok. Drinks lots of fluids. No fever or chills. Weight at home has been all over the place. Taking all medications but did not take meds this morning.   ECHO 04/2019 EF 30-35%  ECHO 2019 EF 55%      Past Medical History:  Diagnosis Date  . Acute pain of left foot 05/03/2017  . Asthma   . Asthma, chronic, mild persistent, uncomplicated 7/82/4235  . Charcot ankle, left 05/31/2017  . CHF (congestive heart failure) ([place])   . Chronic systolic (congestive) heart failure ([place])   . Diabetes mellitus type 2 in obese ([place]) 04/20/2016  . Diabetic polyneuropathy associated with type 2 diabetes mellitus ([place]) 03/14/2017  . Essential hypertension 10/26/2016  . Hypertension   . Obesity   . Obesity, Class III, BMI 40-49.9 (morbid obesity) ([place]) 10/26/2016  . OSA (obstructive sleep apnea) 12/02/2016  . Type 2 diabetes mellitus with diabetic neuropathy (HCC)     Current Outpatient Medications  Medication Sig Dispense Refill  . Amino Acids (L-CARNITINE PO) Take 1 tablet daily by mouth.    Marland Kitchen amLODipine (NORVASC) 5 MG tablet Take 1 tablet (5 mg total) by mouth daily. 90 tablet 1  . blood glucose meter kit and supplies KIT Dispense based on patient and insurance preference. Use up to four times daily as directed. (FOR ICD-9 250.00, 250.01). For QAC - HS accuchecks. May switch to any brand. 1 each 1  . carvedilol (COREG) 25 MG tablet Take 12.5 mg by mouth 2 (two) times daily with a meal.    . empagliflozin (JARDIANCE) 25 MG TABS tablet Take 25 mg by mouth daily. 90 tablet 1  . gabapentin (NEURONTIN) 300 MG capsule Take 1 capsule (300 mg total) by mouth  3 (three) times daily. 90 capsule 3  . glucose blood (CONTOUR NEXT TEST) test strip Use as instructed 100 each 12  . ketoconazole (NIZORAL) 2 % cream Apply 1 fingertip amount to each foot daily. 30 g 0  . metolazone (ZAROXOLYN) 2.5 MG tablet Take 1 tablet (2.5 mg total) by mouth as needed. As directed by HF clinic for weight gain of 3 lbs overnight or 5 lbs within one week. 10 tablet 2  . sacubitril-valsartan (ENTRESTO) 97-103 MG Take 1 tablet by mouth 2 (two) times daily. 60 tablet 6  . sitaGLIPtin (JANUVIA) 100 MG tablet Take 1 tablet (100 mg total) by  mouth daily. 90 tablet 1  . spironolactone (ALDACTONE) 25 MG tablet Take 1 tablet (25 mg total) by mouth daily. 90 tablet 3  . torsemide (DEMADEX) 20 MG tablet Take 1 tablet (20 mg total) by mouth daily. May take an additional tab as needed for weight gain 180 tablet 3  . calcitonin, salmon, (MIACALCIN/FORTICAL) 200 UNIT/ACT nasal spray PLACE 1 SPRAY INTO ALTERNATE NOSTRILS DAILY (Patient not taking: Reported on 07/03/2019) 3.7 mL 0  . Semaglutide (RYBELSUS) 7 MG TABS Take 7 mg by mouth daily. (Patient not taking: Reported on 07/03/2019) 30 tablet 1   No current facility-administered medications for this encounter.    Allergies  Allergen Reactions  . Hydrocodone     Hives       Social History   Socioeconomic History  . Marital status: Married    Spouse name: Not on file  . Number of children: Not on file  . Years of education: Not on file  . Highest education level: Not on file  Occupational History  . Not on file  Tobacco Use  . Smoking status: Never Smoker  . Smokeless tobacco: Never Used  Substance and Sexual Activity  . Alcohol use: No  . Drug use: No  . Sexual activity: Never  Other Topics Concern  . Not on file  Social History Narrative  . Not on file   Social Determinants of Health   Financial Resource Strain:   . Difficulty of Paying Living Expenses:   Food Insecurity:   . Worried About Charity fundraiser in the Last Year:   . Arboriculturist in the Last Year:   Transportation Needs:   . Film/video editor (Medical):   Marland Kitchen Lack of Transportation (Non-Medical):   Physical Activity:   . Days of Exercise per Week:   . Minutes of Exercise per Session:   Stress:   . Feeling of Stress :   Social Connections:   . Frequency of Communication with Friends and Family:   . Frequency of Social Gatherings with Friends and Family:   . Attends Religious Services:   . Active Member of Clubs or Organizations:   . Attends Archivist Meetings:   Marland Kitchen Marital  Status:   Intimate Partner Violence:   . Fear of Current or Ex-Partner:   . Emotionally Abused:   Marland Kitchen Physically Abused:   . Sexually Abused:       Family History  Problem Relation Age of Onset  . Diabetes Mellitus II Mother   . Heart failure Mother   . Hypertension Mother   . Heart disease Mother   . Heart failure Father   . Kidney disease Father   . Heart disease Father   . Congestive Heart Failure Neg Hx     Vitals:   07/03/19 0928  BP: (!) 146/108  Pulse: Marland Kitchen)  111  SpO2: 98%  Weight: (!) 175.7 kg (387 lb 6.4 oz)    Wt Readings from Last 3 Encounters:  07/03/19 (!) 175.7 kg (387 lb 6.4 oz)  05/04/19 (!) 173.7 kg (383 lb)  04/19/19 (!) 173.5 kg (382 lb 8 oz)    PHYSICAL EXAM: General:  Walked slowly in the clinic.  No resp difficulty HEENT: normal Neck: supple. JVP 7-8 . Carotids 2+ bilat; no bruits. No lymphadenopathy or thryomegaly appreciated. Cor: PMI nondisplaced. Regular rate & rhythm. No rubs, gallops or murmurs. Lungs: clear Abdomen: obese, soft, nontender, nondistended. No hepatosplenomegaly. No bruits or masses. Good bowel sounds. Extremities: no cyanosis, clubbing, rash, R and LLE trace-1+ edema Neuro: alert & orientedx3, cranial nerves grossly intact. moves all 4 extremities w/o difficulty. Affect pleasant  EKG: Sinus Tach 110 bpm   ASSESSMENT & PLAN:  1. Chronic systolic CHF due to NICM: normal cors by cath in [place] in 2017. Echo 10/2016 EF 30-35%.  - Echo 02/2017 LVEF 55% with complete recovery. - ECHO 04/2019 EF 30-35%  - Repeat after HF meds optimized.  -Recent acute exacerbation with NYHA IIIb symptoms in the setting of medication noncompliance.   - NYHA III. Volume status mildly elevated.  -  Continue torsemide 20 mg daily.  - Continue Entresto to 97/103 mg BID.  - Increase coreg to 18.75 mg twice a day.  - Continue spiro 25 mg daily.  - Continue Jardiance 10 mg daily  - Consider corlanor next visit if heart rate remains elevated.  - Check BMET    2. HTN - Elevated.  Has not had meds today. Discussed importance of compliance.  3. DM2  -Management per PCP -Continue Jardiance  4. Morbid Obesity -Body mass index is 49.74 kg/m. -Discussed portion control.   5. OSA/OHS - Sleep study +.  Difficulties tolerating CPAP due to current face mass.  He is interested in alternative face apparatus - refer back to sleep clinic, Dr. Radford Pax  6. Charcot's foot - Left - History of surgical reconstruction -Followed by Ortho  7. Noncompliance -Discussed importance of medication compliance.   Follow up  4-5 weeks. Plan to repeat ECHO once Hf meds optimized.    Darrick Grinder, NP 07/03/19

## 2019-07-03 ENCOUNTER — Ambulatory Visit (HOSPITAL_COMMUNITY)
Admission: RE | Admit: 2019-07-03 | Discharge: 2019-07-03 | Disposition: A | Discharge status: Home / Self Care | Payer: 59 | Source: Ambulatory Visit | Attending: Internal Medicine | Admitting: Internal Medicine

## 2019-07-03 ENCOUNTER — Encounter (HOSPITAL_COMMUNITY): Payer: Self-pay

## 2019-07-03 ENCOUNTER — Other Ambulatory Visit: Payer: Self-pay

## 2019-07-03 VITALS — BP 146/108 | HR 111 | Wt 387.4 lb

## 2019-07-03 DIAGNOSIS — I10 Essential (primary) hypertension: Secondary | ICD-10-CM

## 2019-07-03 DIAGNOSIS — R0602 Shortness of breath: Secondary | ICD-10-CM | POA: Diagnosis present

## 2019-07-03 DIAGNOSIS — G4733 Obstructive sleep apnea (adult) (pediatric): Secondary | ICD-10-CM | POA: Diagnosis not present

## 2019-07-03 DIAGNOSIS — I110 Hypertensive heart disease with heart failure: Secondary | ICD-10-CM | POA: Insufficient documentation

## 2019-07-03 DIAGNOSIS — Z9114 Patient's other noncompliance with medication regimen: Secondary | ICD-10-CM | POA: Insufficient documentation

## 2019-07-03 DIAGNOSIS — Z7901 Long term (current) use of anticoagulants: Secondary | ICD-10-CM | POA: Insufficient documentation

## 2019-07-03 DIAGNOSIS — E1142 Type 2 diabetes mellitus with diabetic polyneuropathy: Secondary | ICD-10-CM | POA: Insufficient documentation

## 2019-07-03 DIAGNOSIS — I428 Other cardiomyopathies: Secondary | ICD-10-CM | POA: Insufficient documentation

## 2019-07-03 DIAGNOSIS — Z7984 Long term (current) use of oral hypoglycemic drugs: Secondary | ICD-10-CM | POA: Insufficient documentation

## 2019-07-03 DIAGNOSIS — Z9119 Patient's noncompliance with other medical treatment and regimen: Secondary | ICD-10-CM | POA: Insufficient documentation

## 2019-07-03 DIAGNOSIS — I5022 Chronic systolic (congestive) heart failure: Secondary | ICD-10-CM | POA: Diagnosis not present

## 2019-07-03 DIAGNOSIS — Z6841 Body Mass Index (BMI) 40.0 and over, adult: Secondary | ICD-10-CM | POA: Diagnosis not present

## 2019-07-03 DIAGNOSIS — J45909 Unspecified asthma, uncomplicated: Secondary | ICD-10-CM | POA: Insufficient documentation

## 2019-07-03 DIAGNOSIS — Z8249 Family history of ischemic heart disease and other diseases of the circulatory system: Secondary | ICD-10-CM | POA: Diagnosis not present

## 2019-07-03 DIAGNOSIS — Z79899 Other long term (current) drug therapy: Secondary | ICD-10-CM | POA: Insufficient documentation

## 2019-07-03 LAB — BASIC METABOLIC PANEL
Anion gap: 12 (ref 5–15)
BUN: 9 mg/dL (ref 6–20)
CO2: 23 mmol/L (ref 22–32)
Calcium: 9 mg/dL (ref 8.9–10.3)
Chloride: 103 mmol/L (ref 98–111)
Creatinine, Ser: 0.84 mg/dL (ref 0.61–1.24)
GFR calc Af Amer: >60 mL/min (ref >60)
GFR calc non Af Amer: >60 mL/min (ref >60)
Glucose, Bld: 219 mg/dL — ABNORMAL HIGH (ref 70–99)
Potassium: 4.4 mmol/L (ref 3.5–5.1)
Sodium: 138 mmol/L (ref 135–145)

## 2019-07-03 MED ORDER — CARVEDILOL 12.5 MG PO TABS: 18.7500 mg | ORAL_TABLET | Freq: Two times a day (BID) | ORAL | 3 refills | Status: DC

## 2019-07-03 MED ORDER — EMPAGLIFLOZIN 25 MG PO TABS: 25.0000 mg | ORAL_TABLET | Freq: Every day | ORAL | 1 refills | Status: DC

## 2019-07-03 MED ORDER — CONTOUR NEXT TEST VI STRP: ORAL_STRIP | 0 refills | Status: DC

## 2019-07-03 MED ORDER — AMLODIPINE BESYLATE 5 MG PO TABS: 5.0000 mg | ORAL_TABLET | Freq: Every day | ORAL | 1 refills | Status: DC

## 2019-07-03 MED ORDER — SPIRONOLACTONE 25 MG PO TABS: 25.0000 mg | ORAL_TABLET | Freq: Every day | ORAL | 3 refills | Status: DC

## 2019-07-03 NOTE — Patient Instructions (Signed)
INCREASE Coreg to 18.75 mg, one and one half tab twice daily  A refill of your test strips has been returned to your pharmacy however additional refills with need to come from your PCP (Dr Sande Brothers)   Labs today We will only contact you if something comes back abnormal or we need to make some changes. Otherwise no news is good news!  Your physician recommends that you schedule a follow-up appointment in: 4 weeks  in the Advanced Practitioners (PA/NP) Clinic    Do the following things EVERYDAY: 1) Weigh yourself in the morning before breakfast. Write it down and keep it in a log. 2) Take your medicines as prescribed 3) Eat low salt foods--Limit salt (sodium) to 2000 mg per day.  4) Stay as active as you can everyday 5) Limit all fluids for the day to less than 2 liters  At the Advanced Heart Failure Clinic, you and your health needs are our priority. As part of our continuing mission to provide you with exceptional heart care, we have created designated Provider Care Teams. These Care Teams include your primary Cardiologist (physician) and Advanced Practice Providers (APPs- Physician Assistants and Nurse Practitioners) who all work together to provide you with the care you need, when you need it.   You may see any of the following providers on your designated Care Team at your next follow up: Marland Kitchen Dr Arvilla Meres . Dr Marca Ancona . Tonye Becket, NP . Robbie Lis, PA . Karle Plumber, PharmD   Please be sure to bring in all your medications bottles to every appointment.

## 2019-07-31 ENCOUNTER — Telehealth (HOSPITAL_COMMUNITY): Payer: Self-pay | Admitting: *Deleted

## 2019-07-31 NOTE — Telephone Encounter (Signed)
pts wife left VM requesting a call back about FMLA paperwork. Paperwork is in disability folder  uncompleted at this time.  Routed to [employer]

## 2019-07-31 NOTE — Telephone Encounter (Signed)
Spoke w/Jonathan [name] and wife, FMLA is needed for intermittent leave when Jonathan [name] has appts and episodes. Advised will complete forms and have Dr Gala Romney sign, once completed we will let them know

## 2019-08-01 NOTE — Telephone Encounter (Signed)
FMLA forms completed and signed by Dr Gala Romney, faxed into [employer] at [phone]  Pt and wife both aware and request copy be emailed to them, forms emailed

## 2019-08-09 ENCOUNTER — Other Ambulatory Visit: Payer: Self-pay

## 2019-08-09 ENCOUNTER — Ambulatory Visit (HOSPITAL_COMMUNITY)
Admission: RE | Admit: 2019-08-09 | Discharge: 2019-08-09 | Disposition: A | Discharge status: Home / Self Care | Payer: 59 | Source: Ambulatory Visit | Attending: Cardiology | Admitting: Cardiology

## 2019-08-09 ENCOUNTER — Encounter (HOSPITAL_COMMUNITY): Payer: Self-pay

## 2019-08-09 VITALS — BP 126/82 | HR 96 | Wt 380.0 lb

## 2019-08-09 DIAGNOSIS — Z9119 Patient's noncompliance with other medical treatment and regimen: Secondary | ICD-10-CM | POA: Insufficient documentation

## 2019-08-09 DIAGNOSIS — Z8249 Family history of ischemic heart disease and other diseases of the circulatory system: Secondary | ICD-10-CM | POA: Diagnosis not present

## 2019-08-09 DIAGNOSIS — Z79899 Other long term (current) drug therapy: Secondary | ICD-10-CM | POA: Insufficient documentation

## 2019-08-09 DIAGNOSIS — Z7984 Long term (current) use of oral hypoglycemic drugs: Secondary | ICD-10-CM | POA: Insufficient documentation

## 2019-08-09 DIAGNOSIS — Z885 Allergy status to narcotic agent status: Secondary | ICD-10-CM | POA: Diagnosis not present

## 2019-08-09 DIAGNOSIS — E662 Morbid (severe) obesity with alveolar hypoventilation: Secondary | ICD-10-CM | POA: Diagnosis not present

## 2019-08-09 DIAGNOSIS — I428 Other cardiomyopathies: Secondary | ICD-10-CM | POA: Diagnosis not present

## 2019-08-09 DIAGNOSIS — Z833 Family history of diabetes mellitus: Secondary | ICD-10-CM | POA: Insufficient documentation

## 2019-08-09 DIAGNOSIS — Z841 Family history of disorders of kidney and ureter: Secondary | ICD-10-CM | POA: Diagnosis not present

## 2019-08-09 DIAGNOSIS — Z6841 Body Mass Index (BMI) 40.0 and over, adult: Secondary | ICD-10-CM | POA: Insufficient documentation

## 2019-08-09 DIAGNOSIS — I5022 Chronic systolic (congestive) heart failure: Secondary | ICD-10-CM | POA: Diagnosis not present

## 2019-08-09 DIAGNOSIS — I10 Essential (primary) hypertension: Secondary | ICD-10-CM

## 2019-08-09 DIAGNOSIS — E1142 Type 2 diabetes mellitus with diabetic polyneuropathy: Secondary | ICD-10-CM | POA: Diagnosis not present

## 2019-08-09 DIAGNOSIS — I110 Hypertensive heart disease with heart failure: Secondary | ICD-10-CM | POA: Insufficient documentation

## 2019-08-09 LAB — BASIC METABOLIC PANEL
Anion gap: 12 (ref 5–15)
BUN: 20 mg/dL (ref 6–20)
CO2: 26 mmol/L (ref 22–32)
Calcium: 10.3 mg/dL (ref 8.9–10.3)
Chloride: 97 mmol/L — ABNORMAL LOW (ref 98–111)
Creatinine, Ser: 0.85 mg/dL (ref 0.61–1.24)
GFR calc Af Amer: >60 mL/min (ref >60)
GFR calc non Af Amer: >60 mL/min (ref >60)
Glucose, Bld: 195 mg/dL — ABNORMAL HIGH (ref 70–99)
Potassium: 5.1 mmol/L (ref 3.5–5.1)
Sodium: 135 mmol/L (ref 135–145)

## 2019-08-09 MED ORDER — CARVEDILOL 25 MG PO TABS: 25.0000 mg | ORAL_TABLET | Freq: Two times a day (BID) | ORAL | 3 refills | Status: DC

## 2019-08-09 NOTE — Patient Instructions (Signed)
INCREASE Coreg to 25 mg, one tab twice daily  Labs today We will only contact you if something comes back abnormal or we need to make some changes. Otherwise no news is good news!  Your physician recommends that you schedule a follow-up appointment in: 3 months with Dr Gala Romney and echo  Your physician has requested that you have an echocardiogram. Echocardiography is a painless test that uses sound waves to create images of your heart. It provides your doctor with information about the size and shape of your heart and how well your hearts chambers and valves are working. This procedure takes approximately one hour. There are no restrictions for this procedure.   Do the following things EVERYDAY: 1) Weigh yourself in the morning before breakfast. Write it down and keep it in a log. 2) Take your medicines as prescribed 3) Eat low salt foods--Limit salt (sodium) to 2000 mg per day.  4) Stay as active as you can everyday 5) Limit all fluids for the day to less than 2 liters  At the Advanced Heart Failure Clinic, you and your health needs are our priority. As part of our continuing mission to provide you with exceptional heart care, we have created designated Provider Care Teams. These Care Teams include your primary Cardiologist (physician) and Advanced Practice Providers (APPs- Physician Assistants and Nurse Practitioners) who all work together to provide you with the care you need, when you need it.   You may see any of the following providers on your designated Care Team at your next follow up:  Dr Arvilla Meres  Dr Carron Curie, NP  Robbie Lis, [place]  Karle Plumber, PharmD   Please be sure to bring in all your medications bottles to every appointment.

## 2019-08-09 NOTE — Progress Notes (Addendum)
Advanced Heart Failure Clinic Note   PCP: [name], MD PCP-Cardiologist: Dr. Haroldine Laws   HPI:  Jonathan [name] is a 34 y.o. AA male with history of systolic CHF due to NICM, HTN, DM 2, Asthma, OSA, and morbid obesity (Body mass index is 48.76 kg/m.)  Pt presented to [facility] 10/26/16 with progressive SOB x 2 weeks. He reports h/o systolic CHF with EF 69-62% by Echo in [place] in 2017. Cath around that time without any significant CAD per patient. He was diuresed with IV Lasix, discharge weight was 373 pounds. He had run out of most of his medications due to moving to [place] and not having insurance. Meds restarted at discharge: Coreg, Mearl Latin, Lasix.   He had repeat Echo 03/02/2017. EF had normalized to 55%.   He has a history of poor compliance.  He was previously being followed by paramedicine program.  He was lost to follow-up and not seen in the advanced heart failure clinic since 2019.  Also previously followed by Dr. Radford Pax for sleep apnea but also lost to follow-up.  Non compliant with CPAP.  Reports he cannot tolerate current facemask.  He has recently been followed by remote home health.  He was evaluated 2/22 after he developed progressive lower extremity edema and dyspnea on exertion after running out of his diuretic for approximately 2 months.  He was highly symptomatic with [place] heart association class IIIb symptoms.  Also complained of progressive orthopnea and PND and had gained over 15 pounds. He was restarted on torsemide and continued on Entresto, carvedilol, spironolactone and Jardiance and referred back to the [facility].   Had first return visit 3/21 and has improved compliance since that time. We have been titrating meds gradually.   He returns back for f/u. Feels well. Compliant w/ medications. BP well controlled 126/82. Pulse rate in the 90s. No resting dyspnea. Has chronic, stable, dyspnea w/ moderate activities (also morbidly obese w/ relatively sedentary lifestyle). Wt  is down 7 lb from previous visit. Still noncompliant w/ CPAP.     ECHO 04/2019 EF 30-35%  ECHO 2019 EF 55%     Past Medical History:  Diagnosis Date   Acute pain of left foot 05/03/2017   Asthma    Asthma, chronic, mild persistent, uncomplicated 9/52/8413   Charcot ankle, left 05/31/2017   CHF (congestive heart failure) (HCC)    Chronic systolic (congestive) heart failure ([place])    Diabetes mellitus type 2 in obese ([place]) 04/20/2016   Diabetic polyneuropathy associated with type 2 diabetes mellitus ([place]) 03/14/2017   Essential hypertension 10/26/2016   Hypertension    Obesity    Obesity, Class III, BMI 40-49.9 (morbid obesity) ([place]) 10/26/2016   OSA (obstructive sleep apnea) 12/02/2016   Type 2 diabetes mellitus with diabetic neuropathy (HCC)     Current Outpatient Medications  Medication Sig Dispense Refill   Amino Acids (L-CARNITINE PO) Take 1 tablet daily by mouth.     amLODipine (NORVASC) 5 MG tablet Take 1 tablet (5 mg total) by mouth daily. 90 tablet 1   blood glucose meter kit and supplies KIT Dispense based on patient and insurance preference. Use up to four times daily as directed. (FOR ICD-9 250.00, 250.01). For QAC - HS accuchecks. May switch to any brand. 1 each 1   carvedilol (COREG) 12.5 MG tablet Take 1.5 tablets (18.75 mg total) by mouth 2 (two) times daily with a meal. 90 tablet 3   empagliflozin (JARDIANCE) 25 MG TABS tablet Take 25 mg by  mouth daily. 90 tablet 1   gabapentin (NEURONTIN) 300 MG capsule Take 1 capsule (300 mg total) by mouth 3 (three) times daily. 90 capsule 3   glucose blood (CONTOUR NEXT TEST) test strip Use as instructed additional refills would need to come from PCP (Dr Gilford Rile) 100 each 0   ketoconazole (NIZORAL) 2 % cream Apply 1 fingertip amount to each foot daily. 30 g 0   metolazone (ZAROXOLYN) 2.5 MG tablet Take 1 tablet (2.5 mg total) by mouth as needed. As directed by HF clinic for weight gain of 3 lbs overnight or 5 lbs  within one week. 10 tablet 2   sacubitril-valsartan (ENTRESTO) 97-103 MG Take 1 tablet by mouth 2 (two) times daily. 60 tablet 6   sitaGLIPtin (JANUVIA) 100 MG tablet Take 1 tablet (100 mg total) by mouth daily. 90 tablet 1   spironolactone (ALDACTONE) 25 MG tablet Take 1 tablet (25 mg total) by mouth daily. 90 tablet 3   torsemide (DEMADEX) 20 MG tablet Take 1 tablet (20 mg total) by mouth daily. May take an additional tab as needed for weight gain 180 tablet 3   No current facility-administered medications for this encounter.    Allergies  Allergen Reactions   Hydrocodone     Hives       Social History   Socioeconomic History   Marital status: Married    Spouse name: Not on file   Number of children: Not on file   Years of education: Not on file   Highest education level: Not on file  Occupational History   Not on file  Tobacco Use   Smoking status: Never Smoker   Smokeless tobacco: Never Used  Vaping Use   Vaping Use: Never used  Substance and Sexual Activity   Alcohol use: No   Drug use: No   Sexual activity: Never  Other Topics Concern   Not on file  Social History Narrative   Not on file   Social Determinants of Health   Financial Resource Strain:    Difficulty of Paying Living Expenses:   Food Insecurity:    Worried About Charity fundraiser in the Last Year:    Arboriculturist in the Last Year:   Transportation Needs:    Film/video editor (Medical):    Lack of Transportation (Non-Medical):   Physical Activity:    Days of Exercise per Week:    Minutes of Exercise per Session:   Stress:    Feeling of Stress :   Social Connections:    Frequency of Communication with Friends and Family:    Frequency of Social Gatherings with Friends and Family:    Attends Religious Services:    Active Member of Clubs or Organizations:    Attends Music therapist:    Marital Status:   Intimate Partner Violence:     Fear of Current or Ex-Partner:    Emotionally Abused:    Physically Abused:    Sexually Abused:       Family History  Problem Relation Age of Onset   Diabetes Mellitus II Mother    Heart failure Mother    Hypertension Mother    Heart disease Mother    Heart failure Father    Kidney disease Father    Heart disease Father    Congestive Heart Failure Neg Hx     Vitals:   08/09/19 1158  BP: 126/82  Pulse: 96  SpO2: 100%  Weight: (!) 172.4 kg (  380 lb)    Wt Readings from Last 3 Encounters:  08/09/19 (!) 172.4 kg (380 lb)  07/03/19 (!) 175.7 kg (387 lb 6.4 oz)  05/04/19 (!) 173.7 kg (383 lb)   PHYSICAL EXAM: General:  Super morbidly obese young [name]. No respiratory difficulty HEENT: normal Neck: supple. no JVD. Carotids 2+ bilat; no bruits. No lymphadenopathy or thyromegaly appreciated. Cor: PMI nondisplaced. Regular rate & rhythm. No rubs, gallops or murmurs. Lungs: clear Abdomen: soft, nontender, nondistended. No hepatosplenomegaly. No bruits or masses. Good bowel sounds. Extremities: no cyanosis, clubbing, rash, edema Neuro: alert & oriented x 3, cranial nerves grossly intact. moves all 4 extremities w/o difficulty. Affect pleasant.   EKG: not performed.  ASSESSMENT & PLAN:  1. Chronic systolic CHF due to NICM:  - normal cors by cath in [place] in 2017.  - Echo 10/2016 EF 30-35%.  - Echo 02/2017 LVEF 55% with complete recovery. - ECHO 04/2019 EF back down to 30-35%  In setting of stopping meds - GDMT resumed 03/2019. Improved compliance   - NYHA III, stable. Volume status good - Continue torsemide 20 mg daily.  - Continue Entresto to 97/103 mg BID.  - Increase Coreg to 25 mg bid for better rate control - Add Corlanor next if unable to achieve goal HR <70 bpm  - Continue spiro 25 mg daily.  - Continue Jardiance 10 mg daily  - If BP room at f/u visit, consider addition of Bidil (African American) - Check BMP today  - Reinforced importance of daily wts, strict  med compliance and sodium restriction   2. HTN - controlled on current regimen - increasing Coreg, per above for added HR control - continue all other meds at current doses  - consider later addition of Bidil  3. DM2  -Management per PCP - most recent hgb a1c 10.4   -Continue Jardiance. No GU symptoms   4. Morbid Obesity -Body mass index is 48.79 kg/m. -Discussed portion control.  - Encouraged increasing physical activity   5. OSA/OHS - Sleep study + in 2018. Difficulties tolerating CPAP due to current face mass.  He is interested in alternative face apparatus - refer back to sleep clinic, Dr. Radford Pax  6. Charcot's foot - Left - History of surgical reconstruction - Followed by Ortho  7. H/o Noncompliance - Compliance has improved over the last 3 months. BP well controlled - Discussed importance of continued strict medication compliance.    F/u w/ pharm D in 3-4 weeks. Add Corlanor if HR >70. Consider addition of Bidil if room in BP. Plan repeat echo in 3 months + f/u w/ Dr. Haroldine Laws same day    Lyda Jester, PA-C 08/09/19

## 2019-08-09 NOTE — Addendum Note (Signed)
Encounter addended by: Theresia Bough, CMA on: 08/09/2019 3:23 PM  Actions taken: Order list changed, Diagnosis association updated

## 2019-09-01 ENCOUNTER — Encounter (HOSPITAL_COMMUNITY): Payer: Self-pay

## 2019-09-05 ENCOUNTER — Telehealth: Payer: Self-pay | Admitting: *Deleted

## 2019-09-05 NOTE — Telephone Encounter (Signed)
Information was sent through CoverMyMeds for [place] for Januvia 100 mg tablets.  Approved 09/03/2019 through 09/02/2020.  Angelina Ok, RN 09/05/2019 10:28 AM.

## 2019-09-27 NOTE — Progress Notes (Signed)
Cardiology Office Note    Date:  09/28/2019   ID:  Jonathan [name], DOB Apr 11, 1985, MRN [id number]  PCP:  [name], MD  Cardiologist:  Fransico Him, MD   Chief Complaint  Patient presents with  . Sleep Apnea  . Hypertension    History of Present Illness:  Jonathan [name] is a 34 y.o. male who is being seen today for the evaluation of OSA at the request of Lyda Jester M, P*.  This is a 34yo morbidly obese [name] with a hx of chronic systolic CHF, DM2, HTN, asthma and OSA but has not been using his CPAP.  He has a hx of mild OSA with an AHI of 5.2/hr overall, 9.8/hr when sleeping supine and 16.7/hr during REM sleep. O2 sats dropped to 86% during apneas but the mean O2 sat was 92%.  He had successful CPAP titration 13 cm H2O.  I saw him last in 2019 and he was very noncompliant with his device feeling like he was smothering with the FFM.  He was switched to auto CPAP but never followed back up.  Since he was not compliant with the PAP therapy his insurance made him hand it back in.  He says that the [facility] will not fit because he sleeps on his stomach.    He tells me that he feels very tired in the am and sometimes has to sleep during the day.  He wakes up choking in his sleep.  He snores during the night and he has had witnessed apneas in his sleep.   Past Medical History:  Diagnosis Date  . Acute pain of left foot 05/03/2017  . Asthma   . Asthma, chronic, mild persistent, uncomplicated 05/24/8117  . Charcot ankle, left 05/31/2017  . CHF (congestive heart failure) ([place])   . Chronic systolic (congestive) heart failure ([place])   . Diabetes mellitus type 2 in obese ([place]) 04/20/2016  . Diabetic polyneuropathy associated with type 2 diabetes mellitus ([place]) 03/14/2017  . Essential hypertension 10/26/2016  . Hypertension   . Obesity   . Obesity, Class III, BMI 40-49.9 (morbid obesity) ([place]) 10/26/2016  . OSA (obstructive sleep apnea) 12/02/2016  . Type 2 diabetes mellitus with diabetic  neuropathy [facility])     Past Surgical History:  Procedure Laterality Date  . CARDIAC CATHETERIZATION    . FOOT SURGERY     arch surgery    Current Medications: Current Meds  Medication Sig  . amLODipine (NORVASC) 5 MG tablet Take 1 tablet (5 mg total) by mouth daily.  . blood glucose meter kit and supplies KIT Dispense based on patient and insurance preference. Use up to four times daily as directed. (FOR ICD-9 250.00, 250.01). For QAC - HS accuchecks. May switch to any brand.  . carvedilol (COREG) 25 MG tablet Take 1 tablet (25 mg total) by mouth 2 (two) times daily with a meal.  . empagliflozin (JARDIANCE) 25 MG TABS tablet Take 25 mg by mouth daily.  Marland Kitchen gabapentin (NEURONTIN) 300 MG capsule Take 1 capsule (300 mg total) by mouth 3 (three) times daily.  Marland Kitchen glucose blood (CONTOUR NEXT TEST) test strip Use as instructed additional refills would need to come from PCP (Dr Gilford Rile)  . ketoconazole (NIZORAL) 2 % cream Apply 1 fingertip amount to each foot daily.  . metolazone (ZAROXOLYN) 2.5 MG tablet Take 1 tablet (2.5 mg total) by mouth as needed. As directed by HF clinic for weight gain of 3 lbs overnight or 5 lbs within one week.  Marland Kitchen  sacubitril-valsartan (ENTRESTO) 97-103 MG Take 1 tablet by mouth 2 (two) times daily.  . sitaGLIPtin (JANUVIA) 100 MG tablet Take 1 tablet (100 mg total) by mouth daily.  Marland Kitchen spironolactone (ALDACTONE) 25 MG tablet Take 1 tablet (25 mg total) by mouth daily.  Marland Kitchen torsemide (DEMADEX) 20 MG tablet Take 1 tablet (20 mg total) by mouth daily. May take an additional tab as needed for weight gain    Allergies:   Hydrocodone   Social History   Socioeconomic History  . Marital status: Married    Spouse name: Not on file  . Number of children: Not on file  . Years of education: Not on file  . Highest education level: Not on file  Occupational History  . Not on file  Tobacco Use  . Smoking status: Never Smoker  . Smokeless tobacco: Never Used  Vaping Use  . Vaping  Use: Never used  Substance and Sexual Activity  . Alcohol use: No  . Drug use: No  . Sexual activity: Never  Other Topics Concern  . Not on file  Social History Narrative  . Not on file   Social Determinants of Health   Financial Resource Strain:   . Difficulty of Paying Living Expenses:   Food Insecurity:   . Worried About Charity fundraiser in the Last Year:   . Arboriculturist in the Last Year:   Transportation Needs:   . Film/video editor (Medical):   Marland Kitchen Lack of Transportation (Non-Medical):   Physical Activity:   . Days of Exercise per Week:   . Minutes of Exercise per Session:   Stress:   . Feeling of Stress :   Social Connections:   . Frequency of Communication with Friends and Family:   . Frequency of Social Gatherings with Friends and Family:   . Attends Religious Services:   . Active Member of Clubs or Organizations:   . Attends Archivist Meetings:   Marland Kitchen Marital Status:      Family History:  The patient's family history includes Diabetes Mellitus II in his mother; Heart disease in his father and mother; Heart failure in his father and mother; Hypertension in his mother; Kidney disease in his father.   ROS:   Please see the history of present illness.    ROS All other systems reviewed and are negative.  No flowsheet data found.     PHYSICAL EXAM:   VS:  BP 124/66   Pulse (!) 104   Ht 6' 2"  (1.88 m)   Wt (!) 388 lb 3.2 oz (176.1 kg)   SpO2 94%   BMI 49.84 kg/m    GEN: Well nourished, well developed, in no acute distress  HEENT: normal  Neck: no JVD, carotid bruits, or masses Cardiac: RRR; no murmurs, rubs, or gallops.  Trace edema.  Intact distal pulses bilaterally.  Respiratory:  clear to auscultation bilaterally, normal work of breathing GI: soft, nontender, nondistended, + BS MS: no deformity or atrophy  Skin: warm and dry, no rash Neuro:  Alert and Oriented x 3, Strength and sensation are intact Psych: euthymic mood, full  affect  Wt Readings from Last 3 Encounters:  09/28/19 (!) 388 lb 3.2 oz (176.1 kg)  08/09/19 (!) 380 lb (172.4 kg)  07/03/19 (!) 387 lb 6.4 oz (175.7 kg)      Studies/Labs Reviewed:   EKG:  EKG is not ordered today.   Recent Labs: 05/04/2019: B Natriuretic Peptide 81.0 08/09/2019: BUN 20;  Creatinine, Ser 0.85; Potassium 5.1; Sodium 135   Lipid Panel    Component Value Date/Time   CHOL 135 10/27/2016 0221   TRIG 83 10/27/2016 0221   HDL 22 (L) 10/27/2016 0221   CHOLHDL 6.1 10/27/2016 0221   VLDL 17 10/27/2016 0221   LDLCALC 96 10/27/2016 0221    Additional studies/ records that were reviewed today include:  PAP compliance download    ASSESSMENT:    1. OSA (obstructive sleep apnea)   2. Essential hypertension   3. Morbid obesity ([place])      PLAN:  In order of problems listed above:  1.  OSA  -he has intolerant to the full face mask but unfortunately he is a mouth breather -I will order a split night sleep study using a nasal mask with chin strap  2. HTN -BP controlled on exam -continue Carvedilol 24m BID, amlodipine 571mdaily, spiro 2551maily and Entresto 97-103m32mD  2.  Morbid Obesity -I have encouraged him to get into a routine exercise program and cut back on carbs and portions.    Medication Adjustments/Labs and Tests Ordered: Current medicines are reviewed at length with the patient today.  Concerns regarding medicines are outlined above.  Medication changes, Labs and Tests ordered today are listed in the Patient Instructions below.  There are no Patient Instructions on file for this visit.   Signed, TracFransico Him  09/28/2019 11:01 AM    [address] [place]  [id number]: [phone]: [phone]

## 2019-09-28 ENCOUNTER — Encounter: Payer: Self-pay | Admitting: Cardiology

## 2019-09-28 ENCOUNTER — Telehealth: Payer: Self-pay | Admitting: *Deleted

## 2019-09-28 ENCOUNTER — Ambulatory Visit (INDEPENDENT_AMBULATORY_CARE_PROVIDER_SITE_OTHER): Payer: 59 | Admitting: Cardiology

## 2019-09-28 ENCOUNTER — Other Ambulatory Visit: Payer: Self-pay

## 2019-09-28 VITALS — BP 124/66 | HR 104 | Ht 74.0 in | Wt 388.2 lb

## 2019-09-28 DIAGNOSIS — I10 Essential (primary) hypertension: Secondary | ICD-10-CM | POA: Diagnosis not present

## 2019-09-28 DIAGNOSIS — G4733 Obstructive sleep apnea (adult) (pediatric): Secondary | ICD-10-CM | POA: Diagnosis not present

## 2019-09-28 NOTE — Telephone Encounter (Addendum)
-----   Message from Theresia Majors, RN sent at 09/28/2019 11:13 AM EDT ----- Split night sleep study has been ordered.  I have placed notes in the order - patient cannot tolerate a full mask. Needs to be done with nasal Resmed Airfit mask.  Thanks!!

## 2019-09-28 NOTE — Patient Instructions (Signed)
Medication Instructions:  Your physician recommends that you continue on your current medications as directed. Please refer to the Current Medication list given to you today.  *If you need a refill on your cardiac medications before your next appointment, please call your pharmacy*   Testing/Procedures: Your physician has recommended that you have a sleep study. This test records several body functions during sleep, including: brain activity, eye movement, oxygen and carbon dioxide blood levels, heart rate and rhythm, breathing rate and rhythm, the flow of air through your mouth and nose, snoring, body muscle movements, and chest and belly movement.   Follow-Up: At [facility], you and your health needs are our priority.  As part of our continuing mission to provide you with exceptional heart care, we have created designated Provider Care Teams.  These Care Teams include your primary Cardiologist (physician) and Advanced Practice Providers (APPs -  Physician Assistants and Nurse Practitioners) who all work together to provide you with the care you need, when you need it.  Follow up with Dr. Mayford Knife based on sleep study results.

## 2019-09-28 NOTE — Addendum Note (Signed)
Addended by: Theresia Majors on: 09/28/2019 11:06 AM   Modules accepted: Orders

## 2019-11-09 ENCOUNTER — Other Ambulatory Visit: Payer: Self-pay

## 2019-11-09 ENCOUNTER — Ambulatory Visit (HOSPITAL_COMMUNITY): Admission: RE | Admit: 2019-11-09 | Payer: 59 | Source: Ambulatory Visit

## 2019-11-09 ENCOUNTER — Ambulatory Visit (HOSPITAL_COMMUNITY)
Admission: RE | Admit: 2019-11-09 | Discharge: 2019-11-09 | Disposition: A | Discharge status: Home / Self Care | Payer: 59 | Source: Ambulatory Visit | Attending: Internal Medicine | Admitting: Internal Medicine

## 2019-11-09 ENCOUNTER — Encounter (HOSPITAL_COMMUNITY): Payer: Self-pay | Admitting: Internal Medicine

## 2019-11-09 VITALS — BP 118/90 | HR 100 | Ht 74.0 in | Wt 390.6 lb

## 2019-11-09 DIAGNOSIS — E1142 Type 2 diabetes mellitus with diabetic polyneuropathy: Secondary | ICD-10-CM | POA: Diagnosis not present

## 2019-11-09 DIAGNOSIS — Z7984 Long term (current) use of oral hypoglycemic drugs: Secondary | ICD-10-CM | POA: Insufficient documentation

## 2019-11-09 DIAGNOSIS — I5022 Chronic systolic (congestive) heart failure: Secondary | ICD-10-CM | POA: Insufficient documentation

## 2019-11-09 DIAGNOSIS — Z8249 Family history of ischemic heart disease and other diseases of the circulatory system: Secondary | ICD-10-CM | POA: Insufficient documentation

## 2019-11-09 DIAGNOSIS — G4733 Obstructive sleep apnea (adult) (pediatric): Secondary | ICD-10-CM | POA: Insufficient documentation

## 2019-11-09 DIAGNOSIS — R000 Tachycardia, unspecified: Secondary | ICD-10-CM | POA: Insufficient documentation

## 2019-11-09 DIAGNOSIS — I428 Other cardiomyopathies: Secondary | ICD-10-CM | POA: Diagnosis not present

## 2019-11-09 DIAGNOSIS — A5216 Charcot's arthropathy (tabetic): Secondary | ICD-10-CM | POA: Insufficient documentation

## 2019-11-09 DIAGNOSIS — Z9114 Patient's other noncompliance with medication regimen: Secondary | ICD-10-CM | POA: Insufficient documentation

## 2019-11-09 DIAGNOSIS — J45909 Unspecified asthma, uncomplicated: Secondary | ICD-10-CM | POA: Diagnosis not present

## 2019-11-09 DIAGNOSIS — Z885 Allergy status to narcotic agent status: Secondary | ICD-10-CM | POA: Diagnosis not present

## 2019-11-09 DIAGNOSIS — Z6841 Body Mass Index (BMI) 40.0 and over, adult: Secondary | ICD-10-CM | POA: Diagnosis not present

## 2019-11-09 DIAGNOSIS — Z9119 Patient's noncompliance with other medical treatment and regimen: Secondary | ICD-10-CM | POA: Insufficient documentation

## 2019-11-09 DIAGNOSIS — I110 Hypertensive heart disease with heart failure: Secondary | ICD-10-CM | POA: Insufficient documentation

## 2019-11-09 DIAGNOSIS — Z79899 Other long term (current) drug therapy: Secondary | ICD-10-CM | POA: Insufficient documentation

## 2019-11-09 DIAGNOSIS — Z833 Family history of diabetes mellitus: Secondary | ICD-10-CM | POA: Insufficient documentation

## 2019-11-09 DIAGNOSIS — I10 Essential (primary) hypertension: Secondary | ICD-10-CM

## 2019-11-09 LAB — BASIC METABOLIC PANEL
Anion gap: 11 (ref 5–15)
BUN: 16 mg/dL (ref 6–20)
CO2: 24 mmol/L (ref 22–32)
Calcium: 9.6 mg/dL (ref 8.9–10.3)
Chloride: 100 mmol/L (ref 98–111)
Creatinine, Ser: 1.03 mg/dL (ref 0.61–1.24)
GFR calc Af Amer: >60 mL/min (ref >60)
GFR calc non Af Amer: >60 mL/min (ref >60)
Glucose, Bld: 271 mg/dL — ABNORMAL HIGH (ref 70–99)
Potassium: 4.5 mmol/L (ref 3.5–5.1)
Sodium: 135 mmol/L (ref 135–145)

## 2019-11-09 LAB — CBC
HCT: 45.6 % (ref 39.0–52.0)
Hemoglobin: 14.8 g/dL (ref 13.0–17.0)
MCH: 29.9 pg (ref 26.0–34.0)
MCHC: 32.5 g/dL (ref 30.0–36.0)
MCV: 92.1 fL (ref 80.0–100.0)
Platelets: 238 10*3/uL (ref 150–400)
RBC: 4.95 MIL/uL (ref 4.22–5.81)
RDW: 12.7 % (ref 11.5–15.5)
WBC: 7.2 10*3/uL (ref 4.0–10.5)
nRBC: 0 % (ref 0.0–0.2)

## 2019-11-09 LAB — BRAIN NATRIURETIC PEPTIDE: B Natriuretic Peptide: 301.2 pg/mL — ABNORMAL HIGH (ref 0.0–100.0)

## 2019-11-09 MED ORDER — TORSEMIDE 20 MG PO TABS: 60.0000 mg | ORAL_TABLET | Freq: Every day | ORAL | 3 refills | Status: DC

## 2019-11-09 MED ORDER — DIGOXIN 125 MCG PO TABS: 0.1250 mg | ORAL_TABLET | Freq: Every day | ORAL | 3 refills | Status: DC

## 2019-11-09 NOTE — Patient Instructions (Addendum)
START Digoxin 0.125mg  Daily  INCREASE Torsemide 60mg  (3 tablets) Daily  Labs done today, your results will be available in MyChart, we will contact you for abnormal readings.  Please wear your compression hose daily, place them on as soon as you get up in the morning and remove before you go to bed at night.  You have been referred to Cardiac Rehab. They will call you to schedule an appointment  Your physician recommends that you follow up with our APP clinic with an echocardiogram in 2 weeks  If you have any questions or concerns before your next appointment please send a message through [facility] or call our office at [phone].    TO LEAVE A MESSAGE FOR THE NURSE SELECT OPTION 2, PLEASE LEAVE A MESSAGE INCLUDING: . YOUR NAME . DATE OF BIRTH . CALL BACK NUMBER . REASON FOR CALL**this is important as we prioritize the call backs  YOU WILL RECEIVE A CALL BACK THE SAME DAY AS LONG AS YOU CALL BEFORE 4:00 PM  At the [facility] Clinic, you and your health needs are our priority. As part of our continuing mission to provide you with exceptional heart care, we have created designated Provider Care Teams. These Care Teams include your primary Cardiologist (physician) and Advanced Practice Providers (APPs- Physician Assistants and Nurse Practitioners) who all work together to provide you with the care you need, when you need it.   You may see any of the following providers on your designated Care Team at your next follow up: [phone] Dr Marland Kitchen . Dr Arvilla Meres . Marca Ancona, NP . Tonye Becket, PA . Robbie Lis, PharmD   Please be sure to bring in all your medications bottles to every appointment.

## 2019-11-09 NOTE — Addendum Note (Signed)
Encounter addended by: Samara Snide, RN on: 11/09/2019 3:01 PM  Actions taken: Diagnosis association updated, Order list changed, Charge Capture section accepted, Clinical Note Signed

## 2019-11-09 NOTE — Progress Notes (Signed)
Advanced Heart Failure Clinic Note   PCP: [name], MD PCP-Cardiologist: Dr. Haroldine Laws   HPI:  Jonathan [name] is a 34 y.o. AA male with history of systolic CHF due to NICM, HTN, DM 2, Asthma, OSA, and morbid obesity (Body mass index is 48.76 kg/m.)  Pt [facility] to [facility]16 with progressive SOB x 2 weeks. He reports h/o systolic CHF with EF 24-26% by Echo in [place] in 2017. Cath around that time without any significant CAD per patient. He was diuresed with IV Lasix, discharge weight was 373 pounds. He had run out of most of his medications due to moving to [place] and not having insurance. Meds restarted at discharge: Coreg, Mearl Latin, Lasix.   He had repeat Echo 03/02/2017. EF had normalized to 55%.   He has a history of poor compliance.  He was previously being followed by paramedicine program.  He was lost to follow-up and not seen in the advanced heart failure clinic since 2019.  Also previously followed by Dr. Radford Pax for sleep apnea but also lost to follow-up.  Non compliant with CPAP.  Reports he cannot tolerate current facemask.  He has recently been followed by [facility].  He was evaluated 2/21 after he developed progressive lower extremity edema and dyspnea on exertion after running out of his diuretic for approximately 2 months. NYHA IIIB.Marland Kitchen He was restarted on torsemide and continued on Entresto, carvedilol, spironolactone and Jardiance and referred back to the [facility].   He returns back for f/u. Says he ran out of his carvedilol and metolazone about 4 days ago. Prior to that was taking everything. Now more SOB with exertion. Weight up 3 pounds. + edema. Waiting to hear from Dr. Radford Pax about repeating split night sleep study with nasal mask and chin strap. Takes metolazone about 1-2x/week. Drinks a lot of water.    ECHO 04/2019 EF 30-35%  ECHO 2019 EF 55%     Past Medical History:  Diagnosis Date  . Acute pain of left foot 05/03/2017  . Asthma   . Asthma, chronic,  mild persistent, uncomplicated 8/34/1962  . Charcot ankle, left 05/31/2017  . CHF (congestive heart failure) ([place])   . Chronic systolic (congestive) heart failure ([place])   . Diabetes mellitus type 2 in obese ([place]) 04/20/2016  . Diabetic polyneuropathy associated with type 2 diabetes mellitus ([place]) 03/14/2017  . Essential hypertension 10/26/2016  . Hypertension   . Obesity   . Obesity, Class III, BMI 40-49.9 (morbid obesity) ([place]) 10/26/2016  . OSA (obstructive sleep apnea) 12/02/2016  . Type 2 diabetes mellitus with diabetic neuropathy ([facility])     Current Outpatient Medications  Medication Sig Dispense Refill  . amLODipine (NORVASC) 5 MG tablet Take 1 tablet (5 mg total) by mouth daily. 90 tablet 1  . blood glucose meter kit and supplies KIT Dispense based on patient and insurance preference. Use up to four times daily as directed. (FOR ICD-9 250.00, 250.01). For QAC - HS accuchecks. May switch to any brand. 1 each 1  . carvedilol (COREG) 25 MG tablet Take 1 tablet (25 mg total) by mouth 2 (two) times daily with a meal. 60 tablet 3  . empagliflozin (JARDIANCE) 25 MG TABS tablet Take 25 mg by mouth daily. 90 tablet 1  . glucose blood (CONTOUR NEXT TEST) test strip Use as instructed additional refills would need to come from PCP (Dr Gilford Rile) 100 each 0  . metolazone (ZAROXOLYN) 2.5 MG tablet Take 1 tablet (2.5 mg total) by mouth as  needed. As directed by HF clinic for weight gain of 3 lbs overnight or 5 lbs within one week. 10 tablet 2  . sacubitril-valsartan (ENTRESTO) 97-103 MG Take 1 tablet by mouth 2 (two) times daily. 60 tablet 6  . sitaGLIPtin (JANUVIA) 100 MG tablet Take 1 tablet (100 mg total) by mouth daily. 90 tablet 1  . spironolactone (ALDACTONE) 25 MG tablet Take 1 tablet (25 mg total) by mouth daily. 90 tablet 3  . torsemide (DEMADEX) 20 MG tablet Take 1 tablet (20 mg total) by mouth daily. May take an additional tab as needed for weight gain 180 tablet 3  . gabapentin (NEURONTIN) 300 MG  capsule Take 1 capsule (300 mg total) by mouth 3 (three) times daily. (Patient not taking: Reported on 11/09/2019) 90 capsule 3  . ketoconazole (NIZORAL) 2 % cream Apply 1 fingertip amount to each foot daily. (Patient not taking: Reported on 11/09/2019) 30 g 0   No current facility-administered medications for this encounter.    Allergies  Allergen Reactions  . Hydrocodone     Hives       Social History   Socioeconomic History  . Marital status: Married    Spouse name: Not on file  . Number of children: Not on file  . Years of education: Not on file  . Highest education level: Not on file  Occupational History  . Not on file  Tobacco Use  . Smoking status: Never Smoker  . Smokeless tobacco: Never Used  Vaping Use  . Vaping Use: Never used  Substance and Sexual Activity  . Alcohol use: No  . Drug use: No  . Sexual activity: Never  Other Topics Concern  . Not on file  Social History Narrative  . Not on file   Social Determinants of Health   Financial Resource Strain:   . Difficulty of Paying Living Expenses: Not on file  Food Insecurity:   . Worried About Programme researcher, broadcasting/film/video in the Last Year: Not on file  . Ran Out of Food in the Last Year: Not on file  Transportation Needs:   . Lack of Transportation (Medical): Not on file  . Lack of Transportation (Non-Medical): Not on file  Physical Activity:   . Days of Exercise per Week: Not on file  . Minutes of Exercise per Session: Not on file  Stress:   . Feeling of Stress : Not on file  Social Connections:   . Frequency of Communication with Friends and Family: Not on file  . Frequency of Social Gatherings with Friends and Family: Not on file  . Attends Religious Services: Not on file  . Active Member of Clubs or Organizations: Not on file  . Attends Banker Meetings: Not on file  . Marital Status: Not on file  Intimate Partner Violence:   . Fear of Current or Ex-Partner: Not on file  . Emotionally  Abused: Not on file  . Physically Abused: Not on file  . Sexually Abused: Not on file      Family History  Problem Relation Age of Onset  . Diabetes Mellitus II Mother   . Heart failure Mother   . Hypertension Mother   . Heart disease Mother   . Heart failure Father   . Kidney disease Father   . Heart disease Father   . Congestive Heart Failure Neg Hx     Vitals:   11/09/19 1415  BP: 118/90  Pulse: 100  SpO2: 95%  Weight: Marland Kitchen)  177.2 kg (390 lb 9.6 oz)  Height: 6' 2" (1.88 m)    Wt Readings from Last 3 Encounters:  11/09/19 (!) 177.2 kg (390 lb 9.6 oz)  09/28/19 (!) 176.1 kg (388 lb 3.2 oz)  08/09/19 (!) 172.4 kg (380 lb)   PHYSICAL EXAM: General:  Super morbidly obese young [name]. No respiratory difficulty HEENT: normal Neck: supple. Unable to see JVD. Carotids 2+ bilat; no bruits. No lymphadenopathy or thryomegaly appreciated. Cor: PMI nondisplaced. Regular tachy  No rubs, gallops or murmurs. Lungs: clear Abdomen: obese soft, nontender, nondistended. No hepatosplenomegaly. No bruits or masses. Good bowel sounds. Extremities: no cyanosis, clubbing, rash, 3+ edema +cool Neuro: alert & orientedx3, cranial nerves grossly intact. moves all 4 extremities w/o difficulty. Affect pleasant   ASSESSMENT & PLAN:  1. Chronic systolic CHF due to NICM:  - normal cors by cath in [place] in 2017.  - Echo 10/2016 EF 30-35%.  - Echo 02/2017 LVEF 55% with complete recovery. - ECHO 04/2019 EF back down to 30-35%  In setting of stopping meds - GDMT resumed 03/2019. - Worse today with NYHA IIIb symptoms with volume overload, tachycardia and narrow pulse pressure - Increase torsemide to 60 mg daily.  - Continue Entresto to 97/103 mg BID.  - Continue Coreg to 8.75 mg bid  - Continue spiro 25 mg daily.  - Continue Jardiance 10 mg daily  - Consider Bidil as BP tolerate - I worry about low output physiology.  - Will see back in 2 weeks and repeat echo (missed todays echo)  - Wrap legs  - Refer  CR   2. HTN - BP ok currently  3. DM2  -Management per PCP - most recent hgb a1c 10.4   -Continue Jardiance. No GU symptoms   4. Morbid Obesity -Body mass index is 50.15 kg/m. -Discussed portion control.  - Encouraged increasing physical activity   5. OSA/OHS - needs f/u with Dr. Radford Pax  6. Charcot's foot - Left - History of surgical reconstruction - Followed by Ortho  7. H/o Noncompliance - continues to be an issue   Glori Bickers, MD 11/09/19

## 2019-11-09 NOTE — Addendum Note (Signed)
Encounter addended by: Samara Snide, RN on: 11/09/2019 3:11 PM  Actions taken: Order list changed, Diagnosis association updated

## 2019-11-20 ENCOUNTER — Telehealth (HOSPITAL_COMMUNITY): Payer: Self-pay

## 2019-11-20 NOTE — Telephone Encounter (Signed)
Pt insurance is active and benefits verified through [facility]. Co-pay $10.00, DED $0.00/$0.00 met, out of pocket $900.00/$453.40 met, co-insurance 0%. Yes pre-authorization required. [place], 11/20/19 @ 10:04AM, JZB#[id number]  Pt insurance is active through Medicaid. VPL#[id number]   Will contact patient to see if he is interested in the Cardiac Rehab Program.

## 2019-11-20 NOTE — Telephone Encounter (Signed)
Called and spoke with Jasmine at Dr. Gala Romney office and adv her per pt [facility] they require pre-auth. Also provided her with [facility] auth number [id number].

## 2019-11-20 NOTE — Telephone Encounter (Signed)
Called patient to see if he is interested in the Cardiac Rehab Program. Patient expressed interest. Explained scheduling process and went over insurance, patient verbalized understanding. Adv pt per [facility] they requires pre-authorization and I will contact Dr. Gala Romney office to get pre-authorization.

## 2019-11-29 ENCOUNTER — Telehealth (HOSPITAL_COMMUNITY): Payer: Self-pay

## 2019-11-29 NOTE — Telephone Encounter (Signed)
Called Dr. Prescott Gum office to see what was the status of the prior auth that was requested from pt insurance for cardiac rehab. Per [place], there is no prior auth needed for pt insurance for the cardiac rehab codes. Will proceed with scheduling pt for cardiac rehab.

## 2019-12-02 NOTE — Progress Notes (Signed)
°Advanced Heart Failure Clinic Note  ° °PCP: Winters, Steven, MD °PCP-Cardiologist: Dr. Bensimhon  ° °HPI:  °Jonathan [name] is a 34 y.o. AA male with history of systolic CHF due to NICM, HTN, DM 2, Asthma, OSA, and morbid obesity (Body mass index is 48.76 kg/m².) °  °Pt presented to [facility] 10/26/16 with progressive SOB x 2 weeks. He reports h/o systolic CHF with EF 15-20% by Echo in [place] in 2017. Cath around that time without any significant CAD per patient. He was diuresed with IV Lasix, discharge weight was 373 pounds. He had run out of most of his medications due to moving to [place] and not having insurance. Meds restarted at discharge: Coreg, Entresto, Spiro, Lasix.  ° °He had repeat Echo 03/02/2017. EF had normalized to 55%.  ° °He has a history of poor compliance.  He was previously being followed by paramedicine program.  He was lost to follow-up and not seen in the advanced heart failure clinic since 2019.  Also previously followed by Dr. Turner for sleep apnea but also lost to follow-up.  Non compliant with CPAP.  Reports he cannot tolerate current facemask. ° °He has recently been followed by [facility].  He was evaluated 2/21 after he developed progressive lower extremity edema and dyspnea on exertion after running out of his diuretic for approximately 2 months. NYHA IIIB.. He was restarted on torsemide and continued on Entresto, carvedilol, spironolactone and Jardiance and referred back to the [facility].  ° °Today he returns for HF follow up. Last visit torsemide was increased to 60 mg daily but he says he cant take that much. Usually takes 20-40 mg a day. Complaining of fatigue. Not walking much around the house. SOB with steps. Denies PND/Orthopnea. Appetite ok. No fever or chills.  Taking all medications. ° °Echo today with EF 30-35% with moderate RV dysfunction in setting of recurrent RBBB with significant dyssynchrony. Reviewed by Dr Bensimhon.  ° °ECHO 04/2019 EF 30-35%  °ECHO 2019 EF 55%  ° ° ° °Past  Medical History:  °Diagnosis Date  °• Acute pain of left foot 05/03/2017  °• Asthma   °• Asthma, chronic, mild persistent, uncomplicated 10/26/2016  °• Charcot ankle, left 05/31/2017  °• CHF (congestive heart failure) (HCC)   °• Chronic systolic (congestive) heart failure (HCC)   °• Diabetes mellitus type 2 in obese (HCC) 04/20/2016  °• Diabetic polyneuropathy associated with type 2 diabetes mellitus (HCC) 03/14/2017  °• Essential hypertension 10/26/2016  °• Hypertension   °• Obesity   °• Obesity, Class III, BMI 40-49.9 (morbid obesity) (HCC) 10/26/2016  °• OSA (obstructive sleep apnea) 12/02/2016  °• Type 2 diabetes mellitus with diabetic neuropathy (HCC)   ° ° °Current Outpatient Medications  °Medication Sig Dispense Refill  °• amLODipine (NORVASC) 5 MG tablet Take 1 tablet (5 mg total) by mouth daily. 90 tablet 1  °• blood glucose meter kit and supplies KIT Dispense based on patient and insurance preference. Use up to four times daily as directed. (FOR ICD-9 250.00, 250.01). For QAC - HS accuchecks. May switch to any brand. 1 each 1  °• carvedilol (COREG) 25 MG tablet Take 1 tablet (25 mg total) by mouth 2 (two) times daily with a meal. 60 tablet 3  °• digoxin (LANOXIN) 0.125 MG tablet Take 1 tablet (0.125 mg total) by mouth daily. 90 tablet 3  °• empagliflozin (JARDIANCE) 25 MG TABS tablet Take 25 mg by mouth daily. 90 tablet 1  °• gabapentin (NEURONTIN) 300 MG capsule Take 300   Take 300 mg by mouth as needed.     glucose blood (CONTOUR NEXT TEST) test strip Use as instructed additional refills would need to come from PCP (Dr Gilford Rile) 100 each 0   metolazone (ZAROXOLYN) 2.5 MG tablet Take 1 tablet (2.5 mg total) by mouth as needed. As directed by HF clinic for weight gain of 3 lbs overnight or 5 lbs within one week. 10 tablet 2   sacubitril-valsartan (ENTRESTO) 97-103 MG Take 1 tablet by mouth 2 (two) times daily. 60 tablet 6   sitaGLIPtin (JANUVIA) 100 MG tablet Take 1 tablet (100 mg total) by mouth daily. 90 tablet 1     spironolactone (ALDACTONE) 25 MG tablet Take 1 tablet (25 mg total) by mouth daily. 90 tablet 3   torsemide (DEMADEX) 20 MG tablet Take 3 tablets (60 mg total) by mouth daily. 90 tablet 3   No current facility-administered medications for this encounter.    Allergies  Allergen Reactions   Hydrocodone     Hives       Social History   Socioeconomic History   Marital status: Married    Spouse name: Not on file   Number of children: Not on file   Years of education: Not on file   Highest education level: Not on file  Occupational History   Not on file  Tobacco Use   Smoking status: Never Smoker   Smokeless tobacco: Never Used  Vaping Use   Vaping Use: Never used  Substance and Sexual Activity   Alcohol use: No   Drug use: No   Sexual activity: Never  Other Topics Concern   Not on file  Social History Narrative   Not on file   Social Determinants of Health   Financial Resource Strain:    Difficulty of Paying Living Expenses: Not on file  Food Insecurity:    Worried About [place] in the Last Year: Not on file   Ran Out of Food in the Last Year: Not on file  Transportation Needs:    Lack of Transportation (Medical): Not on file   Lack of Transportation (Non-Medical): Not on file  Physical Activity:    Days of Exercise per Week: Not on file   Minutes of Exercise per Session: Not on file  Stress:    Feeling of Stress : Not on file  Social Connections:    Frequency of Communication with Friends and Family: Not on file   Frequency of Social Gatherings with Friends and Family: Not on file   Attends Religious Services: Not on file   Active Member of Clubs or Organizations: Not on file   Attends Archivist Meetings: Not on file   Marital Status: Not on file  Intimate Partner Violence:    Fear of Current or Ex-Partner: Not on file   Emotionally Abused: Not on file   Physically Abused: Not on file   Sexually  Abused: Not on file      Family History  Problem Relation Age of Onset   Diabetes Mellitus II Mother    Heart failure Mother    Hypertension Mother    Heart disease Mother    Heart failure Father    Kidney disease Father    Heart disease Father    Congestive Heart Failure Neg Hx     Vitals:   12/03/19 1001  BP: 130/89  Pulse: 98  SpO2: 96%  Weight: (!) 173.5 kg (382 lb 9.6 oz)    Wt Readings  3 Encounters:  °12/03/19 (!) 173.5 kg (382 lb 9.6 oz)  °11/09/19 (!) 177.2 kg (390 lb 9.6 oz)  °09/28/19 (!) 176.1 kg (388 lb 3.2 oz)  ° °PHYSICAL EXAM: °General:  Appears tired. No resp difficulty °HEENT: normal °Neck: supple. no JVD. Carotids 2+ bilat; no bruits. No lymphadenopathy or thryomegaly appreciated. °Cor: PMI nondisplaced. Regular rate & rhythm. No rubs, gallops or murmurs. °Lungs: clear °Abdomen: obese, soft, nontender, nondistended. No hepatosplenomegaly. No bruits or masses. Good bowel sounds. °Extremities: no cyanosis, clubbing, rash, edema °Neuro: alert & orientedx3, cranial nerves grossly intact. moves all 4 extremities w/o difficulty. Affect pleasant ° °ASSESSMENT & PLAN: ° °1. Chronic systolic CHF due to NICM:  °- normal cors by cath in [place] in 2017.  °- Echo 10/2016 EF 30-35%.  °- Echo 02/2017 LVEF 55% with complete recovery. °- ECHO 04/2019 EF back down to 30-35%  In setting of stopping meds °- Echo 12/03/19 EF 30-35% with moderate RV dysfunction in setting of recurrent RBBB with significant dyssynchrony. Refer to EP for ICD/CRT-D.  °- GDMT resumed 03/2019. °- NYHA III.  Volume status stable. Continue torsemide 60 mg as tolerated.   °- Continue Entresto to 97/103 mg BID.  °- Continue Coreg to 8.75 mg bid  °- Continue spiro 25 mg daily.  °- Continue Jardiance 10 mg daily  °- Consider Bidil as BP tolerate °-BMET  ° °2. HTN °- Ok today.  °-We are setting up outpatient sleep study.  ° °3. DM2  °-Management per PCP °- most recent hgb a1c 10.4  o n 04/09/19  °-Continue  Jardiance.  °- No GU symptoms  °  °4. Morbid Obesity °-Body mass index is 49.12 kg/m².  °- Needs to lose weight.  °  °5. OSA/OHS °- Set up outpatient sleep study for HTN/day time fatigue.  °  °6. Charcot's foot - Left °- No open wounds noted. Personally assessed °  °7. H/o Noncompliance °No longer an issues.  ° °Follow up 6-8 weeks with Dr Bensimhon.  ° ° °Amy Clegg, NP °12/03/19  ° °Patient seen and examined with the above-signed Advanced Practice Provider and/or Housestaff. I personally reviewed laboratory data, imaging studies and relevant notes. I independently examined the patient and formulated the important aspects of the plan. I have edited the note to reflect any of my changes or salient points. I have personally discussed the plan with the patient and/or family. ° °Continues with NYHA Class III symptoms. Volume status looks ok. Echo reviewed personally today and EF remains 30-35% despite excellent GDMT. There is significant dyssynchrony in setting of RBBB.  Has not had sleep study yet. ° °General:  Obese male  No resp difficulty °HEENT: normal °Neck: supple. no JVD. Carotids 2+ bilat; no bruits. No lymphadenopathy or thryomegaly appreciated. °Cor: PMI nondisplaced. Regular rate & rhythm. No rubs, gallops or murmurs. °Lungs: clear °Abdomen: obese soft, nontender, nondistended. No hepatosplenomegaly. No bruits or masses. Good bowel sounds. °Extremities: no cyanosis, clubbing, rash, edema °Neuro: alert & orientedx3, cranial nerves grossly intact. moves all 4 extremities w/o difficulty. Affect pleasant ° °Still struggling with NYHA III symptoms despite excellent GDMT. Refer for ICD. Has significant dyssynchrony on echo in setting of RBBB so likely no role for CRT. Check labs. Encouraged more activity and to f/u with sleep study.  ° °Daniel Bensimhon, MD  °7:46 PM ° ° ° °

## 2019-12-03 ENCOUNTER — Telehealth (HOSPITAL_COMMUNITY): Payer: Self-pay

## 2019-12-03 ENCOUNTER — Ambulatory Visit (HOSPITAL_BASED_OUTPATIENT_CLINIC_OR_DEPARTMENT_OTHER)
Admission: RE | Admit: 2019-12-03 | Discharge: 2019-12-03 | Disposition: A | Discharge status: Home / Self Care | Payer: 59 | Source: Ambulatory Visit | Attending: Orthopedic Surgery | Admitting: Orthopedic Surgery

## 2019-12-03 ENCOUNTER — Ambulatory Visit (HOSPITAL_COMMUNITY)
Admission: RE | Admit: 2019-12-03 | Discharge: 2019-12-03 | Disposition: A | Discharge status: Home / Self Care | Payer: 59 | Source: Ambulatory Visit | Attending: Internal Medicine | Admitting: Internal Medicine

## 2019-12-03 ENCOUNTER — Other Ambulatory Visit: Payer: Self-pay

## 2019-12-03 VITALS — BP 130/89 | HR 98 | Wt 382.6 lb

## 2019-12-03 DIAGNOSIS — G4733 Obstructive sleep apnea (adult) (pediatric): Secondary | ICD-10-CM | POA: Insufficient documentation

## 2019-12-03 DIAGNOSIS — A5216 Charcot's arthropathy (tabetic): Secondary | ICD-10-CM | POA: Diagnosis not present

## 2019-12-03 DIAGNOSIS — I428 Other cardiomyopathies: Secondary | ICD-10-CM | POA: Diagnosis not present

## 2019-12-03 DIAGNOSIS — E1142 Type 2 diabetes mellitus with diabetic polyneuropathy: Secondary | ICD-10-CM | POA: Diagnosis not present

## 2019-12-03 DIAGNOSIS — I10 Essential (primary) hypertension: Secondary | ICD-10-CM

## 2019-12-03 DIAGNOSIS — Z6841 Body Mass Index (BMI) 40.0 and over, adult: Secondary | ICD-10-CM | POA: Insufficient documentation

## 2019-12-03 DIAGNOSIS — I5022 Chronic systolic (congestive) heart failure: Secondary | ICD-10-CM | POA: Diagnosis not present

## 2019-12-03 DIAGNOSIS — Z79899 Other long term (current) drug therapy: Secondary | ICD-10-CM | POA: Diagnosis not present

## 2019-12-03 DIAGNOSIS — Z7984 Long term (current) use of oral hypoglycemic drugs: Secondary | ICD-10-CM | POA: Insufficient documentation

## 2019-12-03 DIAGNOSIS — I110 Hypertensive heart disease with heart failure: Secondary | ICD-10-CM | POA: Insufficient documentation

## 2019-12-03 DIAGNOSIS — Z9119 Patient's noncompliance with other medical treatment and regimen: Secondary | ICD-10-CM | POA: Diagnosis not present

## 2019-12-03 DIAGNOSIS — J45909 Unspecified asthma, uncomplicated: Secondary | ICD-10-CM | POA: Diagnosis not present

## 2019-12-03 DIAGNOSIS — I4510 Unspecified right bundle-branch block: Secondary | ICD-10-CM | POA: Diagnosis not present

## 2019-12-03 DIAGNOSIS — R0683 Snoring: Secondary | ICD-10-CM | POA: Diagnosis not present

## 2019-12-03 LAB — BASIC METABOLIC PANEL
Anion gap: 12 (ref 5–15)
BUN: 16 mg/dL (ref 6–20)
CO2: 25 mmol/L (ref 22–32)
Calcium: 9.5 mg/dL (ref 8.9–10.3)
Chloride: 96 mmol/L — ABNORMAL LOW (ref 98–111)
Creatinine, Ser: 0.84 mg/dL (ref 0.61–1.24)
GFR, Estimated: >60 mL/min (ref >60)
Glucose, Bld: 383 mg/dL — ABNORMAL HIGH (ref 70–99)
Potassium: 5 mmol/L (ref 3.5–5.1)
Sodium: 133 mmol/L — ABNORMAL LOW (ref 135–145)

## 2019-12-03 LAB — ECHOCARDIOGRAM COMPLETE: S' Lateral: 4.2 cm

## 2019-12-03 NOTE — Telephone Encounter (Signed)
-----   Message from Sherald Hess, NP sent at 12/03/2019  1:19 PM EDT ----- Needs follow up with PCP regarding elevated blood sugar. Please call.

## 2019-12-03 NOTE — Patient Instructions (Addendum)
It was great to see you today! No medication changes are needed at this time.  Labs today We will only contact you if something comes back abnormal or we need to make some changes. Otherwise no news is good news!  You have been referred to [facility] Address: [address] [place], [place] [address] [phone] -they will be in contact with an appointment  You have been referred to [facility] Address:  [address], [place], [place] [address] Phone: [phone] -they will be in contact with an appointment  Your physician recommends that you schedule a follow-up appointment in: 6-8 weeks with Dr Gala Romney  If you have any questions or concerns before your next appointment please send [place] a message through [place] or call our office at [phone].    TO LEAVE A MESSAGE FOR THE NURSE SELECT OPTION 2, PLEASE LEAVE A MESSAGE INCLUDING: . YOUR NAME . DATE OF BIRTH . CALL BACK NUMBER . REASON FOR CALL**this is important as we prioritize the call backs  YOU WILL RECEIVE A CALL BACK THE SAME DAY AS LONG AS YOU CALL BEFORE 4:00 PM

## 2019-12-03 NOTE — Telephone Encounter (Signed)
Samara Snide, RN  12/03/2019 2:19 PM EDT Back to Top    Patient advised and verbalized understanding

## 2019-12-03 NOTE — Progress Notes (Signed)
  Echocardiogram 2D Echocardiogram has been performed.  Jonathan [name] 12/03/2019, 10:09 AM

## 2019-12-04 ENCOUNTER — Telehealth: Payer: Self-pay | Admitting: *Deleted

## 2019-12-04 NOTE — Telephone Encounter (Signed)
His study has to be precerted through [facility] health. I have submitted it. I will call the patient. Pt is aware and agreeable to treatment.

## 2019-12-04 NOTE — Telephone Encounter (Signed)
Split Night PA submitted to [facility] via [place].

## 2019-12-04 NOTE — Telephone Encounter (Deleted)
-----   Message from Jimmey Ralph sent at 12/04/2019  1:55 PM EDT ----- Regarding: Split night sleep study Hi Nina, This patient called and wanted to know when his sleep study would be scheduled. I wasn't sure if that is something you would schedule or if someone else schedules.  If you could call the patient and help him he would appreciate it. I told him you have your hands full while you cover for Burna Mortimer and asked him to be patient.  Thanks for all of your help, Candise Bowens

## 2019-12-14 NOTE — Telephone Encounter (Signed)
Ok to schedule split night valid dates are 12/14/19- 03/13/20   Patient is scheduled for lab study on 01/15/20. Patient understands his sleep study will be done at [facility] sleep lab. Patient understands he will receive a sleep packet in a week or so. Patient understands to call if he does not receive the sleep packet in a timely manner.  Scheduled with Camilla at [facility]

## 2019-12-17 NOTE — Telephone Encounter (Signed)
Called pt to scheduled for cardiac rehab but pt stated that he is no longer interested in the program. Closed referral.

## 2020-01-08 ENCOUNTER — Telehealth: Payer: 59 | Admitting: Cardiology

## 2020-01-13 ENCOUNTER — Encounter: Payer: Self-pay | Admitting: Internal Medicine

## 2020-01-13 NOTE — Progress Notes (Signed)
Advanced Heart Failure Clinic Note   PCP: [name], MD PCP-Cardiologist: Dr. Haroldine Laws   HPI:  Jonathan [name] is a 34 y.o. male with history of systolic HF due to NICM, HTN, DM 2, Asthma, OSA, and morbid obesity (Body mass index is Body mass index is 52.79 kg/m.)  Admitted 9/18 with progressive SOB x 2 weeks. He reports h/o systolic CHF with EF 06-26% by Echo in [place] in 2017. Cath around that time without any significant CAD per patient. He was diuresed with IV Lasix, discharge weight was 373 pounds. He had run out of most of his medications due to moving to [place] and not having insurance. Meds restarted at discharge: Coreg, Mearl Latin, Lasix.   He had repeat Echo 03/02/2017. EF had normalized to 55%.   He has a history of poor compliance.  He was previously being followed by paramedicine program.  He was lost to follow-up and not seen in the advanced heart failure clinic since 2019.  Also previously followed by Dr. Radford Pax for sleep apnea but also lost to follow-up.  Non compliant with CPAP.  Reports he cannot tolerate current facemask.  He has recently been followed by [place].  He was evaluated 2/21 after he developed progressive lower extremity edema and dyspnea on exertion after running out of his diuretic for approximately 2 months. NYHA IIIB.Marland Kitchen He was restarted on torsemide and continued on Entresto, carvedilol, spironolactone and Jardiance and referred back to the [facility].   Today he returns for HF follow up, in wheelchair. Complaints of increased SOB, coughing, wheezing for past week. Weight up 28 lbs since last visit.  He is stress eating & drinking due to mother's failing health. Last visit torsemide was increased to 60 mg daily but he says he can't take that much due to abdominal pain when he urinated that frequently. Usually takes 60 mg torsemide one day and then 20-40 mg the next day. Has metolazone at home but has not taken it in a month. Not weighing every day.  +PND/Orthopnea. Appetite ok with early satiety. No fever or chills.    Echo 10/21: EF 30-35% with moderate RV dysfunction in setting of recurrent RBBB with significant dyssynchrony. Reviewed by Dr Haroldine Laws.   ECHO 04/2019: EF 30-35%   ECHO 2019: EF 55%    Past Medical History:  Diagnosis Date  . Acute pain of left foot 05/03/2017  . Asthma   . Asthma, chronic, mild persistent, uncomplicated 9/48/5462  . Charcot ankle, left 05/31/2017  . CHF (congestive heart failure) ([place])   . Chronic systolic (congestive) heart failure ([place])   . Diabetes mellitus type 2 in obese ([place]) 04/20/2016  . Diabetic polyneuropathy associated with type 2 diabetes mellitus ([place]) 03/14/2017  . Essential hypertension 10/26/2016  . Hypertension   . Obesity   . Obesity, Class III, BMI 40-49.9 (morbid obesity) ([place]) 10/26/2016  . OSA (obstructive sleep apnea) 12/02/2016  . Type 2 diabetes mellitus with diabetic neuropathy (HCC)     Current Outpatient Medications  Medication Sig Dispense Refill  . amLODipine (NORVASC) 5 MG tablet Take 1 tablet (5 mg total) by mouth daily. 90 tablet 1  . blood glucose meter kit and supplies KIT Dispense based on patient and insurance preference. Use up to four times daily as directed. (FOR ICD-9 250.00, 250.01). For QAC - HS accuchecks. May switch to any brand. 1 each 1  . carvedilol (COREG) 25 MG tablet Take 25 mg by mouth 2 (two) times daily with a meal. 1.5  tabs    . digoxin (LANOXIN) 0.125 MG tablet Take 1 tablet (0.125 mg total) by mouth daily. 90 tablet 3  . empagliflozin (JARDIANCE) 25 MG TABS tablet Take 25 mg by mouth daily. 90 tablet 1  . gabapentin (NEURONTIN) 300 MG capsule Take 300 mg by mouth as needed.    Marland Kitchen glucose blood (CONTOUR NEXT TEST) test strip Use as instructed additional refills would need to come from PCP (Dr Gilford Rile) 100 each 0  . metolazone (ZAROXOLYN) 2.5 MG tablet Take 1 tablet (2.5 mg total) by mouth as needed. As directed by HF clinic for weight gain of 3  lbs overnight or 5 lbs within one week. 10 tablet 2  . sacubitril-valsartan (ENTRESTO) 97-103 MG Take 1 tablet by mouth 2 (two) times daily. 60 tablet 6  . sitaGLIPtin (JANUVIA) 100 MG tablet Take 1 tablet (100 mg total) by mouth daily. 90 tablet 1  . spironolactone (ALDACTONE) 25 MG tablet Take 1 tablet (25 mg total) by mouth daily. 90 tablet 3  . torsemide (DEMADEX) 20 MG tablet Take 3 tablets (60 mg total) by mouth daily. 90 tablet 3   No current facility-administered medications for this encounter.    Allergies  Allergen Reactions  . Hydrocodone     Hives       Social History   Socioeconomic History  . Marital status: Married    Spouse name: Not on file  . Number of children: Not on file  . Years of education: Not on file  . Highest education level: Not on file  Occupational History  . Not on file  Tobacco Use  . Smoking status: Never Smoker  . Smokeless tobacco: Never Used  Vaping Use  . Vaping Use: Never used  Substance and Sexual Activity  . Alcohol use: No  . Drug use: No  . Sexual activity: Never  Other Topics Concern  . Not on file  Social History Narrative  . Not on file   Social Determinants of Health   Financial Resource Strain:   . Difficulty of Paying Living Expenses: Not on file  Food Insecurity:   . Worried About Charity fundraiser in the Last Year: Not on file  . Ran Out of Food in the Last Year: Not on file  Transportation Needs:   . Lack of Transportation (Medical): Not on file  . Lack of Transportation (Non-Medical): Not on file  Physical Activity:   . Days of Exercise per Week: Not on file  . Minutes of Exercise per Session: Not on file  Stress:   . Feeling of Stress : Not on file  Social Connections:   . Frequency of Communication with Friends and Family: Not on file  . Frequency of Social Gatherings with Friends and Family: Not on file  . Attends Religious Services: Not on file  . Active Member of Clubs or Organizations: Not on file   . Attends Archivist Meetings: Not on file  . Marital Status: Not on file  Intimate Partner Violence:   . Fear of Current or Ex-Partner: Not on file  . Emotionally Abused: Not on file  . Physically Abused: Not on file  . Sexually Abused: Not on file      Family History  Problem Relation Age of Onset  . Diabetes Mellitus II Mother   . Heart failure Mother   . Hypertension Mother   . Heart disease Mother   . Heart failure Father   . Kidney disease Father   .  Heart disease Father   . Congestive Heart Failure Neg Hx     Vitals:   01/17/20 1129  BP: (!) 160/88  Pulse: (!) 104  SpO2: 95%  Weight: (!) 186.5 kg (411 lb 3.2 oz)    Wt Readings from Last 3 Encounters:  01/17/20 (!) 186.5 kg (411 lb 3.2 oz)  01/15/20 (!) 175.5 kg (387 lb)  12/03/19 (!) 173.5 kg (382 lb 9.6 oz)   PHYSICAL EXAM: General:  In wheelchair, SOB speaking.  HEENT: normal Neck: supple. Difficult to assess JVD due to neck circumference. Carotids 2+ bilat; no bruits. No lymphadenopathy or thryomegaly appreciated. Cor: PMI nondisplaced. Regular rate & rhythm. No rubs, gallops or murmurs. Lungs: diminished Abdomen: obese, distended, soft, chronic tender LLQ,  No hepatosplenomegaly. No bruits or masses. Good bowel sounds. Extremities: no cyanosis, clubbing, rash, 2-3+ bilateral lower extremity edema (compression hose in place) Neuro: alert & orientedx3, cranial nerves grossly intact. moves all 4 extremities w/o difficulty. Affect pleasant   ASSESSMENT & PLAN:  1. Chronic systolic CHF due to NICM:  - normal cors by cath in [place] in 2017.  - Echo 10/2016 EF 30-35%.  - Echo 02/2017 LVEF 55% with complete recovery. - ECHO 04/2019 EF back down to 30-35%  In setting of stopping meds - Echo 12/03/19 EF 30-35% with moderate RV dysfunction in setting of recurrent RBBB with significant dyssynchrony. Refer to EP for ICD/CRT-D.  - GDMT resumed 03/2019. - NYHA IIIb.  Volume status markedly elevated. ReDs clip  attempted, poor quality. - Start Metolazone 2.5 mg daily x 3 days + 40 mEq KCl - Continue torsemide 60 mg (stressed importance of taking this every day)   - Continue Entresto to 97/103 mg BID.  - Continue Coreg to 8.75 mg bid  - Continue spiro 25 mg daily.  - Continue Jardiance 10 mg daily  - Consider Bidil as BP tolerates at next visit - Still struggling with NYHA III symptoms despite excellent GDMT. Refer for ICD once volume improved. Has significant dyssynchrony on echo in setting of RBBB so likely no role for CRT.  - BMET and BNP today, repeat BMET next week and reassess fluid - Long discussion about need for compliance. Will arrange Paramedicine  2. HTN - BP up today, but didn't take AM meds yet   3. DM2  - Management per PCP - most recent hgb a1c 10.4  o n 04/09/19  - Continue Jardiance.  - No GU symptoms   4. Morbid Obesity -Body mass index is 52.79 kg/m.  - Needs to lose weight.   5. OSA/OHS - Completed sleep study. - Results pending.   6. Charcot's foot - Left - No wounds  7. H/o Noncompliance - Has not recently been an issue, but parents' ailing health has caused Zenia Resides to not take care of himself; encouraged counseling - referral for Paramedicine  Total time spent 45 minutes. Over half that time spent discussing above.   Glori Bickers, MD 01/17/20

## 2020-01-14 ENCOUNTER — Other Ambulatory Visit: Payer: Self-pay | Admitting: *Deleted

## 2020-01-15 ENCOUNTER — Other Ambulatory Visit: Payer: Self-pay

## 2020-01-15 ENCOUNTER — Ambulatory Visit (HOSPITAL_BASED_OUTPATIENT_CLINIC_OR_DEPARTMENT_OTHER): Payer: 59 | Attending: Cardiology | Admitting: Cardiology

## 2020-01-15 DIAGNOSIS — G4733 Obstructive sleep apnea (adult) (pediatric): Secondary | ICD-10-CM

## 2020-01-15 DIAGNOSIS — I493 Ventricular premature depolarization: Secondary | ICD-10-CM | POA: Insufficient documentation

## 2020-01-15 MED ORDER — AMLODIPINE BESYLATE 5 MG PO TABS: 5.0000 mg | ORAL_TABLET | Freq: Every day | ORAL | 1 refills | Status: DC

## 2020-01-17 ENCOUNTER — Ambulatory Visit (HOSPITAL_COMMUNITY)
Admission: RE | Admit: 2020-01-17 | Discharge: 2020-01-17 | Disposition: A | Discharge status: Home / Self Care | Payer: 59 | Source: Ambulatory Visit | Attending: Internal Medicine | Admitting: Internal Medicine

## 2020-01-17 ENCOUNTER — Encounter (HOSPITAL_COMMUNITY): Payer: Self-pay | Admitting: Internal Medicine

## 2020-01-17 ENCOUNTER — Telehealth: Payer: Self-pay | Admitting: *Deleted

## 2020-01-17 ENCOUNTER — Other Ambulatory Visit: Payer: Self-pay

## 2020-01-17 VITALS — BP 160/88 | HR 104 | Wt >= 6400 oz

## 2020-01-17 DIAGNOSIS — Z9119 Patient's noncompliance with other medical treatment and regimen: Secondary | ICD-10-CM | POA: Insufficient documentation

## 2020-01-17 DIAGNOSIS — I10 Essential (primary) hypertension: Secondary | ICD-10-CM

## 2020-01-17 DIAGNOSIS — Z993 Dependence on wheelchair: Secondary | ICD-10-CM | POA: Insufficient documentation

## 2020-01-17 DIAGNOSIS — E1142 Type 2 diabetes mellitus with diabetic polyneuropathy: Secondary | ICD-10-CM | POA: Insufficient documentation

## 2020-01-17 DIAGNOSIS — I428 Other cardiomyopathies: Secondary | ICD-10-CM | POA: Insufficient documentation

## 2020-01-17 DIAGNOSIS — I5022 Chronic systolic (congestive) heart failure: Secondary | ICD-10-CM | POA: Diagnosis not present

## 2020-01-17 DIAGNOSIS — I4510 Unspecified right bundle-branch block: Secondary | ICD-10-CM | POA: Diagnosis not present

## 2020-01-17 DIAGNOSIS — G4733 Obstructive sleep apnea (adult) (pediatric): Secondary | ICD-10-CM

## 2020-01-17 DIAGNOSIS — Z8249 Family history of ischemic heart disease and other diseases of the circulatory system: Secondary | ICD-10-CM | POA: Diagnosis not present

## 2020-01-17 DIAGNOSIS — Z6841 Body Mass Index (BMI) 40.0 and over, adult: Secondary | ICD-10-CM | POA: Insufficient documentation

## 2020-01-17 DIAGNOSIS — Z833 Family history of diabetes mellitus: Secondary | ICD-10-CM | POA: Diagnosis not present

## 2020-01-17 DIAGNOSIS — Z9114 Patient's other noncompliance with medication regimen: Secondary | ICD-10-CM | POA: Diagnosis not present

## 2020-01-17 DIAGNOSIS — Z7984 Long term (current) use of oral hypoglycemic drugs: Secondary | ICD-10-CM | POA: Diagnosis not present

## 2020-01-17 DIAGNOSIS — Z79899 Other long term (current) drug therapy: Secondary | ICD-10-CM | POA: Insufficient documentation

## 2020-01-17 DIAGNOSIS — J45909 Unspecified asthma, uncomplicated: Secondary | ICD-10-CM | POA: Insufficient documentation

## 2020-01-17 DIAGNOSIS — M14672 Charcot's joint, left ankle and foot: Secondary | ICD-10-CM | POA: Insufficient documentation

## 2020-01-17 DIAGNOSIS — I110 Hypertensive heart disease with heart failure: Secondary | ICD-10-CM | POA: Diagnosis not present

## 2020-01-17 DIAGNOSIS — Z6379 Other stressful life events affecting family and household: Secondary | ICD-10-CM | POA: Insufficient documentation

## 2020-01-17 LAB — BRAIN NATRIURETIC PEPTIDE: B Natriuretic Peptide: 821.7 pg/mL — ABNORMAL HIGH (ref 0.0–100.0)

## 2020-01-17 LAB — BASIC METABOLIC PANEL
Anion gap: 14 (ref 5–15)
BUN: 12 mg/dL (ref 6–20)
CO2: 24 mmol/L (ref 22–32)
Calcium: 9.5 mg/dL (ref 8.9–10.3)
Chloride: 98 mmol/L (ref 98–111)
Creatinine, Ser: 0.87 mg/dL (ref 0.61–1.24)
GFR, Estimated: >60 mL/min (ref >60)
Glucose, Bld: 305 mg/dL — ABNORMAL HIGH (ref 70–99)
Potassium: 4.8 mmol/L (ref 3.5–5.1)
Sodium: 136 mmol/L (ref 135–145)

## 2020-01-17 MED ORDER — METOLAZONE 2.5 MG PO TABS
2.5000 mg | ORAL_TABLET | Freq: Every day | ORAL | 2 refills | Status: DC | PRN
Start: 2020-01-17 — End: 2020-01-24

## 2020-01-17 MED ORDER — POTASSIUM CHLORIDE CRYS ER 20 MEQ PO TBCR: 40.0000 meq | EXTENDED_RELEASE_TABLET | ORAL | 3 refills | Status: DC | PRN

## 2020-01-17 NOTE — Progress Notes (Signed)
Paramedicine Initial Assessment:  Housing:  In what kind of housing do you live? House/apt/trailer/shelter? apt  Do you rent/pay a mortgage/own? rent  Do you live with anyone? Just his wife and pets  Are you currently worried about losing your housing? no  Within the past 12 months have you ever stayed outside, in a car, tent, a shelter, or temporarily with someone? no  Within the past 12 months have you been unable to get utilities when it was really needed? no  Social:  What is your current marital status? wife  Do you have family or friends who live locally? aunt  Food:  Within the past 12 months were you ever worried that food would run out before you got money to buy more? no  Within the past 78months have you run out of food and didn't have money to buy more? No  Food stamps  Income:  What is your current source of income? Wife is Working and pt gets disability but it is only $18 because he lives with wife and they reduce the check according to her income  How hard is it for you to pay for the basics like food housing, medical care, and utilities? Somewhat hard  Do you have outstanding medical bills? no  Insurance:  Are you currently insured? medicaid  Do you have prescription coverage? yes  If no insurance, have you applied for coverage (Medicaid, disability, marketplace etc)?  Transportation:  Do you have transportation to your medical appointments? Yes- uses [facility] or wife drives him  In the past 12 months has lack of transportation kept you from medical appts or from getting medications? no  In the past 12 months has lack of transportation kept you from meetings, work, or getting things you needed? no   Daily Health Needs: Do you have a working scale at home? Yes- reports weighing everyday  How do you manage your medications at home? Uses pillbox that he fills himself  Do you ever take your medications differently than prescribed? Yes- admits he was  adjusting medications cause he thought they were causing him pain- he discussed with physician today who is adjusting medications accordingly.  Do you have issues affording your medications? no  If yes, has this ever prevented you from obtaining medications? no  Do you have any concerns with mobility at home? Pt is a high fall risk but can completed ADLs as needed  Do you use any assistive devices at home or have PCS at home? Has a can and bariatric walker at home  Do you have a PCP? Dolan Amen but hasn't seen in awhile- they will plan to set up new appt   Are there any additional barriers you see to getting the care you need? Pt interested in getting more mobile- CSW discussed referral to PREP program or speaking with our exercise physiologist to get moving more- pt is agreeable to referral to exercise physiologist at this time.  CSW will continue to follow through paramedicine program and assist as needed.  Burna Sis, LCSW Clinical Social Worker [facility] Desk#: [phone] Cell#: [phone]

## 2020-01-17 NOTE — Patient Instructions (Signed)
Take Metolazone 2.5 mg Daiy FOR 3 DAYS  Take Potassium 40 meq (2 tabs) FOR 3 DAYS when you take Metolazone  Labs done today, your results will be available in MyChart, we will contact you for abnormal readings.  You have been referred to Paramedicine, they will call you to schedule an appointment  Your physician recommends that you return for lab work in: 1 week with labs  If you have any questions or concerns before your next appointment please send [place] a message through [place] or call our office at [phone].    TO LEAVE A MESSAGE FOR THE NURSE SELECT OPTION 2, PLEASE LEAVE A MESSAGE INCLUDING: . YOUR NAME . DATE OF BIRTH . CALL BACK NUMBER . REASON FOR CALL**this is important as we prioritize the call backs  YOU WILL RECEIVE A CALL BACK THE SAME DAY AS LONG AS YOU CALL BEFORE 4:00 PM

## 2020-01-17 NOTE — Telephone Encounter (Signed)
-----   Message from Quintella Reichert, MD sent at 01/17/2020  2:43 PM EST ----- Please let patient know that they have sleep apnea and but had unsuccessful CPAP  titration. Please set up BiPAP titration in the sleep lab.

## 2020-01-17 NOTE — Procedures (Signed)
Patient Name: Jonathan [name], Jonathan [name] Date: 01/15/2020 Gender: Male D.O.B: 21-Nov-1985 Age (years): 63 Referring Provider: Fransico Him MD, ABSM Height (inches): 74 Interpreting Physician: Fransico Him MD, ABSM Weight (lbs): 387 RPSGT: Gwenyth Allegra BMI: 50 MRN: [id number] Neck Size: 20.00  CLINICAL INFORMATION Sleep Study Type: Split Night CPAP  Indication for sleep study: Congestive Heart Failure, Diabetes, Hypertension, Obesity, OSA  SLEEP STUDY TECHNIQUE As per the AASM Manual for the Scoring of Sleep and Associated Events v2.3 (April 2016) with a hypopnea requiring 4% desaturations.  The channels recorded and monitored were frontal, central and occipital EEG, electrooculogram (EOG), submentalis EMG (chin), nasal and oral airflow, thoracic and abdominal wall motion, anterior tibialis EMG, snore microphone, electrocardiogram, and pulse oximetry. Continuous positive airway pressure (CPAP) was initiated when the patient met split night criteria and was titrated according to treat sleep-disordered breathing.  MEDICATIONS Medications self-administered by patient taken the night of the study : ENTRESTO, CARVEDILOL, SPIRONOLACTONE, JANUMET, TORSEMIDE  RESPIRATORY PARAMETERS Diagnostic Total AHI (/hr): 65.2  RDI (/hr):65.6  OA Index (/hr): 3.3  CA Index (/hr): 8.4 REM AHI (/hr): 83.1  NREM AHI (/hr): 1.9  Supine AHI (/hr): /A  Non-supine AHI (/hr): 5.2 Min O2 Sat (%):65.0  Mean O2 (%): 6.9  ime below 88% (min):79   Titration Optimal Pressure (cm):N/A  AHI at Optimal Pressure (/hr):N/A  Min O2 at Optimal Pressure (%):N/A Supine % at Optimal (%):N/A  Sleep % at Optimal (%):N/A   SLEEP ARCHITECTURE The recording time for the entire night was 400.6 minutes.  During a baseline period of 146.0 minutes, the patient slept for 128.0 minutes in REM and nonREM, yielding a sleep efficiency of 87.7%. Sleep onset after lights out was 0.2 minutes with a REM latency of 82.5 minutes. The  patient spent 5.9% of the night in stage N1 sleep, 78.9% in stage N2 sleep, 0.0% in stage N3 and 15.2% in REM.  During the titration period of 253.6 minutes, the patient slept for 161.0 minutes in REM and nonREM, yielding a sleep efficiency of 63.5%. Sleep onset after CPAP initiation was 7.8 minutes with a REM latency of 26.0 minutes. The patient spent 11.5% of the night in stage N1 sleep, 62.4% in stage N2 sleep, 0.6% in stage N3 and 25.5% in REM.  CARDIAC DATA The 2 lead EKG demonstrated sinus rhythm. The mean heart rate was 100.0 beats per minute. Other EKG findings include: PVCs, ventricular salvo.  LEG MOVEMENT DATA The total Periodic Limb Movements of Sleep (PLMS) were 0. The PLMS index was 0.0 .  IMPRESSIONS - Severe obstructive sleep apnea occurred during the diagnostic portion of the study (AHI = 65.2/hour). An optimal PAP pressure could not be selected due to ongoing respiratory events.  - Mild central sleep apnea occurred during the diagnostic portion of the study (CAI = 8.4/hour). - Moderate oxygen desaturation was noted during the diagnostic portion of the study (Min O2 =65.0%). - The patient snored with loud snoring volume during the diagnostic portion of the study. - PVCs and ventricular slavo were noted during this study. - Clinically significant periodic limb movements did not occur during sleep.  DIAGNOSIS - Obstructive Sleep Apnea (G47.33) - PVCs  RECOMMENDATIONS - Recommend in lab BiPAP titration.  - Avoid alcohol, sedatives and other CNS depressants that may worsen sleep apnea and disrupt normal sleep architecture. - Sleep hygiene should be reviewed to assess factors that may improve sleep quality. - Weight management and regular exercise should be initiated or continued.  [Electronically signed] 01/17/2020  02:39 PM  Fransico Him MD, ABSM Diplomate, American Board of Sleep Medicine

## 2020-01-17 NOTE — Telephone Encounter (Signed)
Informed patient of sleep study results and patient understanding was verbalized. Patient understands his sleep study showed they have sleep apnea and but had unsuccessful CPAP titration. Please set up BiPAP titration in the sleep lab.   Pt is aware and not agreeable to his results and states he could not tolerate the nasal mask but wanted a full face mask and did not get that. Patient says he will call back later to let us know if he will return for the next test.  bipap titration sent to sleep pool.

## 2020-01-19 ENCOUNTER — Encounter (HOSPITAL_COMMUNITY): Payer: Self-pay

## 2020-01-19 NOTE — Addendum Note (Signed)
Encounter addended by: Dolores Patty, MD on: 01/19/2020 10:07 PM  Actions taken: Level of Service modified, Visit diagnoses modified

## 2020-01-21 NOTE — Telephone Encounter (Signed)
Called patient to get more information. No answer/left vm requesting return call.

## 2020-01-22 ENCOUNTER — Other Ambulatory Visit (HOSPITAL_COMMUNITY): Payer: Self-pay

## 2020-01-22 ENCOUNTER — Other Ambulatory Visit (HOSPITAL_COMMUNITY): Payer: Self-pay | Admitting: *Deleted

## 2020-01-22 MED ORDER — CARVEDILOL 12.5 MG PO TABS
18.7500 mg | ORAL_TABLET | Freq: Two times a day (BID) | ORAL | 3 refills | Status: DC
Start: 2020-01-22 — End: 2020-02-25

## 2020-01-22 NOTE — Progress Notes (Addendum)
Paramedicine Encounter    Patient ID: Jonathan [name], male    DOB: March 30, 1985, 34 y.o.   MRN: [id number]   Patient Care Team: [name], MD as PCP - General Sueanne Margarita, MD as PCP - Cardiology (Cardiology) Bensimhon, Shaune Pascal, MD as PCP - [facility] (Cardiology)  Patient Active Problem List   Diagnosis Date Noted  . Candidiasis of penis 12/08/2018  . Hives 07/10/2018  . Acute pain of right knee 12/16/2017  . Depression 11/06/2017  . Charcot ankle, left 05/31/2017  . Acute pain of left foot 05/03/2017  . Diabetic polyneuropathy associated with type 2 diabetes mellitus ([place]) 03/14/2017  . OSA (obstructive sleep apnea) 12/02/2016  . Chronic systolic (congestive) heart failure ([place])   . Morbid obesity with BMI of 50.0-59.9, adult ([place]) 10/26/2016  . Asthma, chronic, mild persistent, uncomplicated 62/94/7654  . Essential hypertension 10/26/2016  . Diabetes mellitus type 2 in obese ([place]) 04/20/2016    Current Outpatient Medications:  .  amLODipine (NORVASC) 5 MG tablet, Take 1 tablet (5 mg total) by mouth daily., Disp: 90 tablet, Rfl: 1 .  blood glucose meter kit and supplies KIT, Dispense based on patient and insurance preference. Use up to four times daily as directed. (FOR ICD-9 250.00, 250.01). For QAC - HS accuchecks. May switch to any brand., Disp: 1 each, Rfl: 1 .  carvedilol (COREG) 25 MG tablet, Take 25 mg by mouth 2 (two) times daily with a meal. 1.5 tabs, Disp: , Rfl:  .  digoxin (LANOXIN) 0.125 MG tablet, Take 1 tablet (0.125 mg total) by mouth daily., Disp: 90 tablet, Rfl: 3 .  empagliflozin (JARDIANCE) 25 MG TABS tablet, Take 25 mg by mouth daily., Disp: 90 tablet, Rfl: 1 .  gabapentin (NEURONTIN) 300 MG capsule, Take 300 mg by mouth as needed., Disp: , Rfl:  .  glucose blood (CONTOUR NEXT TEST) test strip, Use as instructed additional refills would need to come from PCP (Dr Gilford Rile), Disp: 100 each, Rfl: 0 .  metolazone (ZAROXOLYN) 2.5 MG tablet, Take 1  tablet (2.5 mg total) by mouth daily as needed. As directed by HF clinic for weight gain of 3 lbs overnight or 5 lbs within one week., Disp: 10 tablet, Rfl: 2 .  potassium chloride SA (KLOR-CON) 20 MEQ tablet, Take 2 tablets (40 mEq total) by mouth as needed. When you take Metolazone, Disp: 10 tablet, Rfl: 3 .  sacubitril-valsartan (ENTRESTO) 97-103 MG, Take 1 tablet by mouth 2 (two) times daily., Disp: 60 tablet, Rfl: 6 .  sitaGLIPtin (JANUVIA) 100 MG tablet, Take 1 tablet (100 mg total) by mouth daily., Disp: 90 tablet, Rfl: 1 .  spironolactone (ALDACTONE) 25 MG tablet, Take 1 tablet (25 mg total) by mouth daily., Disp: 90 tablet, Rfl: 3 .  torsemide (DEMADEX) 20 MG tablet, Take 3 tablets (60 mg total) by mouth daily., Disp: 90 tablet, Rfl: 3 Allergies  Allergen Reactions  . Hydrocodone     Hives       Social History   Socioeconomic History  . Marital status: Married    Spouse name: Not on file  . Number of children: Not on file  . Years of education: Not on file  . Highest education level: Not on file  Occupational History  . Not on file  Tobacco Use  . Smoking status: Never Smoker  . Smokeless tobacco: Never Used  Vaping Use  . Vaping Use: Never used  Substance and Sexual Activity  . Alcohol use: No  .  Drug use: No  . Sexual activity: Never  Other Topics Concern  . Not on file  Social History Narrative  . Not on file   Social Determinants of Health   Financial Resource Strain: Medium Risk  . Difficulty of Paying Living Expenses: Somewhat hard  Food Insecurity: No Food Insecurity  . Worried About Charity fundraiser in the Last Year: Never true  . Ran Out of Food in the Last Year: Never true  Transportation Needs: No Transportation Needs  . Lack of Transportation (Medical): No  . Lack of Transportation (Non-Medical): No  Physical Activity:   . Days of Exercise per Week: Not on file  . Minutes of Exercise per Session: Not on file  Stress:   . Feeling of Stress :  Not on file  Social Connections:   . Frequency of Communication with Friends and Family: Not on file  . Frequency of Social Gatherings with Friends and Family: Not on file  . Attends Religious Services: Not on file  . Active Member of Clubs or Organizations: Not on file  . Attends Archivist Meetings: Not on file  . Marital Status: Not on file  Intimate Partner Violence:   . Fear of Current or Ex-Partner: Not on file  . Emotionally Abused: Not on file  . Physically Abused: Not on file  . Sexually Abused: Not on file    Physical Exam      Future Appointments  Date Time Provider [place]  01/24/2020  9:20 AM Bensimhon, Shaune Pascal, MD [facility] None  02/19/2020  3:20 PM Turner, Eber Hong, MD [facility]    BP 110/70   Pulse 90   Resp 20   Wt (!) 378 lb (171.5 kg)   SpO2 95%   BMI 48.53 kg/m  CBG EMS-330  Weight yesterday-389 Last visit weight-411  Pt is being re-referred after several years.  He said his mom got sick a few wks ago and that really just got him off track with his meds.  Pt lives in apt with wife.  She gets his meds from pharmacy.  He uses [facility] on [address]. No issues affording the meds.  His weight is coming down rapidly.  Pt is back weighing daily.  Back on his meds.  His mom is doing better so he feels better.  meds reviewed-he did not have his pill bottles but he reports calling in his refills.  Will confirm that carvedilol dose--notes in last encounter say 8.75 but he says he is taking 59m 1.5 BID so he is taking 37.552mBID. He fills his own pill box but there was only a couple days in there.  He has not been checking the CBG's for past several wks. He wants to be seen at endocrinologist.  He doesn't take 6018morsemide daily. He might take it every other day and then the other days he would take maybe 1-2 tablets.  He did not want the ICD.  Not sleeping ok-stressed. He did sleep study and he is waiting for the results.  However it looks like there was a note that stated something about his getting bipap instead of cpap due to a certain mask he wanted.  Will f/u next week. He has no issues or concerns today.  He states he can get up without dizziness, no sob upon walking and his legs look better, they are painful but better. He is surprised by his CBG-he states he is drinking zero sugar drinks-gatorades as well no  sugar but I advised him to avoid those as they have a lot of sodium and can cause his fluid to increase.   Marylouise Stacks, [place] [facility] Paramedic  01/22/20

## 2020-01-23 ENCOUNTER — Telehealth: Payer: Self-pay | Admitting: Cardiology

## 2020-01-23 NOTE — Telephone Encounter (Signed)
Auth submitted to [place] through [place], Ref # [id number]

## 2020-01-24 ENCOUNTER — Telehealth: Payer: Self-pay | Admitting: Cardiology

## 2020-01-24 ENCOUNTER — Other Ambulatory Visit: Payer: Self-pay

## 2020-01-24 ENCOUNTER — Other Ambulatory Visit (HOSPITAL_COMMUNITY): Payer: Self-pay | Admitting: Internal Medicine

## 2020-01-24 ENCOUNTER — Encounter (HOSPITAL_COMMUNITY): Payer: Self-pay | Admitting: Internal Medicine

## 2020-01-24 ENCOUNTER — Ambulatory Visit (HOSPITAL_COMMUNITY)
Admission: RE | Admit: 2020-01-24 | Discharge: 2020-01-24 | Disposition: A | Discharge status: Home / Self Care | Payer: 59 | Source: Ambulatory Visit | Attending: Internal Medicine | Admitting: Internal Medicine

## 2020-01-24 VITALS — BP 102/60 | HR 98 | Wt 383.0 lb

## 2020-01-24 DIAGNOSIS — Z8249 Family history of ischemic heart disease and other diseases of the circulatory system: Secondary | ICD-10-CM | POA: Diagnosis not present

## 2020-01-24 DIAGNOSIS — I10 Essential (primary) hypertension: Secondary | ICD-10-CM

## 2020-01-24 DIAGNOSIS — E1142 Type 2 diabetes mellitus with diabetic polyneuropathy: Secondary | ICD-10-CM | POA: Diagnosis not present

## 2020-01-24 DIAGNOSIS — Z833 Family history of diabetes mellitus: Secondary | ICD-10-CM | POA: Insufficient documentation

## 2020-01-24 DIAGNOSIS — J45909 Unspecified asthma, uncomplicated: Secondary | ICD-10-CM | POA: Diagnosis not present

## 2020-01-24 DIAGNOSIS — I4510 Unspecified right bundle-branch block: Secondary | ICD-10-CM | POA: Diagnosis not present

## 2020-01-24 DIAGNOSIS — Z9114 Patient's other noncompliance with medication regimen: Secondary | ICD-10-CM | POA: Diagnosis not present

## 2020-01-24 DIAGNOSIS — Z9119 Patient's noncompliance with other medical treatment and regimen: Secondary | ICD-10-CM | POA: Diagnosis not present

## 2020-01-24 DIAGNOSIS — I5022 Chronic systolic (congestive) heart failure: Secondary | ICD-10-CM | POA: Insufficient documentation

## 2020-01-24 DIAGNOSIS — Z885 Allergy status to narcotic agent status: Secondary | ICD-10-CM | POA: Insufficient documentation

## 2020-01-24 DIAGNOSIS — Z6841 Body Mass Index (BMI) 40.0 and over, adult: Secondary | ICD-10-CM | POA: Diagnosis not present

## 2020-01-24 DIAGNOSIS — I428 Other cardiomyopathies: Secondary | ICD-10-CM | POA: Insufficient documentation

## 2020-01-24 DIAGNOSIS — I110 Hypertensive heart disease with heart failure: Secondary | ICD-10-CM | POA: Diagnosis present

## 2020-01-24 DIAGNOSIS — Z7984 Long term (current) use of oral hypoglycemic drugs: Secondary | ICD-10-CM | POA: Diagnosis not present

## 2020-01-24 DIAGNOSIS — Z79899 Other long term (current) drug therapy: Secondary | ICD-10-CM | POA: Insufficient documentation

## 2020-01-24 DIAGNOSIS — G4733 Obstructive sleep apnea (adult) (pediatric): Secondary | ICD-10-CM | POA: Diagnosis not present

## 2020-01-24 LAB — BASIC METABOLIC PANEL
Anion gap: 13 (ref 5–15)
BUN: 23 mg/dL — ABNORMAL HIGH (ref 6–20)
CO2: 25 mmol/L (ref 22–32)
Calcium: 9.7 mg/dL (ref 8.9–10.3)
Chloride: 96 mmol/L — ABNORMAL LOW (ref 98–111)
Creatinine, Ser: 0.98 mg/dL (ref 0.61–1.24)
GFR, Estimated: >60 mL/min (ref >60)
Glucose, Bld: 291 mg/dL — ABNORMAL HIGH (ref 70–99)
Potassium: 4.8 mmol/L (ref 3.5–5.1)
Sodium: 134 mmol/L — ABNORMAL LOW (ref 135–145)

## 2020-01-24 LAB — DIGOXIN LEVEL: Digoxin Level: 0.7 ng/mL — ABNORMAL LOW (ref 0.8–2.0)

## 2020-01-24 MED ORDER — POTASSIUM CHLORIDE CRYS ER 20 MEQ PO TBCR: 40.0000 meq | EXTENDED_RELEASE_TABLET | ORAL | 3 refills | Status: DC | PRN

## 2020-01-24 MED ORDER — METOLAZONE 2.5 MG PO TABS: 2.5000 mg | ORAL_TABLET | Freq: Every day | ORAL | 2 refills | Status: DC | PRN

## 2020-01-24 MED ORDER — TORSEMIDE 20 MG PO TABS
60.0000 mg | ORAL_TABLET | Freq: Every day | ORAL | 3 refills | Status: DC
Start: 2020-01-24 — End: 2020-01-28

## 2020-01-24 NOTE — Patient Instructions (Signed)
Stop Amlodipine  Take Metolazone as needed 1-2 a month for weight gain of 5 lbs or more  Take an extra 20 mg of Torsemide as needed for weight gain up to 5 lbs  Any time you take Metolazone or extra Torsemide take Potassium 40 meq (2 tabs)   Labs done today, your results will be available in MyChart, we will contact you for abnormal readings.  Your physician recommends that you schedule a follow-up appointment in: 1 month  If you have any questions or concerns before your next appointment please send [place] a message through [place] or call our office at [phone].    TO LEAVE A MESSAGE FOR THE NURSE SELECT OPTION 2, PLEASE LEAVE A MESSAGE INCLUDING: . YOUR NAME . DATE OF BIRTH . CALL BACK NUMBER . REASON FOR CALL**this is important as we prioritize the call backs  YOU WILL RECEIVE A CALL BACK THE SAME DAY AS LONG AS YOU CALL BEFORE 4:00 PM  At the [facility], you and your health needs are our priority. As part of our continuing mission to provide you with exceptional heart care, we have created designated Provider Care Teams. These Care Teams include your primary Cardiologist (physician) and Advanced Practice Providers (APPs- Physician Assistants and Nurse Practitioners) who all work together to provide you with the care you need, when you need it.   You may see any of the following providers on your designated Care Team at your next follow up: Marland Kitchen Dr Arvilla Meres . Dr Marca Ancona . Tonye Becket, NP . Robbie Lis, PA . Karle Plumber, PharmD   Please be sure to bring in all your medications bottles to every appointment.

## 2020-01-24 NOTE — Addendum Note (Signed)
Encounter addended by: Dolores Patty, MD on: 01/24/2020 10:39 AM  Actions taken: Level of Service modified, Visit diagnoses modified

## 2020-01-24 NOTE — Telephone Encounter (Signed)
APPROVED FROM 01/23/20 - 04/22/20; CASE # [id number]. 

## 2020-01-24 NOTE — Progress Notes (Signed)
Advanced Heart Failure Clinic Note   PCP: [name], MD HF Cardiologist: Dr. Haroldine Laws   HPI:  Jonathan [name] is a 34 y.o. male with history of systolic HF due to NICM, HTN, DM 2, Asthma, OSA, and morbid obesity (Body mass index is Body mass index is 49.17 kg/m.)  Admitted 9/18 with progressive SOB x 2 weeks. He reports h/o systolic CHF with EF 27-74% by Echo in [place] in 2017. Cath around that time without any significant CAD per patient. He was diuresed with IV Lasix, discharge weight was 373 pounds. He had run out of most of his medications due to moving to [place] and not having insurance. Meds restarted at discharge: Coreg, Mearl Latin, Lasix.   He had repeat Echo 03/02/2017. EF had normalized to 55%.   He has a history of poor compliance.  He was previously being followed by paramedicine program.  He was lost to follow-up and not seen in the advanced heart failure clinic since 2019.  Also previously followed by Dr. Radford Pax for sleep apnea but also lost to follow-up.  Non compliant with CPAP.  Reports he cannot tolerate current facemask.  He has recently been followed by [facility].  He was evaluated 2/21 after he developed progressive lower extremity edema and dyspnea on exertion after running out of his diuretic for approximately 2 months. NYHA IIIB.Marland Kitchen He was restarted on torsemide and continued on Entresto, carvedilol, spironolactone and Jardiance and referred back to the [facility]   12/2 follow up for weight gain of 28 lbs and SOB due to dietary indiscretion and diuretic noncompliance. Instructed to take metolazone 2.5 mg x 3 days + extra 40 KCl and set up with paramedicine.   Returns for follow up after aggressive diureses last week with metolazone for a 28 lb weight gain.  Feeling much better. Weight down 28 lbs. No longer SOB at rest, no CP, dizziness, PND/orthopnea. Being seen by Paramedicine. Awaiting cpap. Trying to limit fluids but gets cramps.  Weighing at home ~376-370  lbs.  Echo 10/21: EF 30-35% with moderate RV dysfunction in setting of recurrent RBBB with significant dyssynchrony. Reviewed by Dr Haroldine Laws.   ECHO 04/2019: EF 30-35%   ECHO 2019: EF 55%    Past Medical History:  Diagnosis Date  . Acute pain of left foot 05/03/2017  . Asthma   . Asthma, chronic, mild persistent, uncomplicated 03/14/7865  . Charcot ankle, left 05/31/2017  . CHF (congestive heart failure) ([place])   . Chronic systolic (congestive) heart failure ([place])   . Diabetes mellitus type 2 in obese ([place]) 04/20/2016  . Diabetic polyneuropathy associated with type 2 diabetes mellitus ([place]) 03/14/2017  . Essential hypertension 10/26/2016  . Hypertension   . Obesity   . Obesity, Class III, BMI 40-49.9 (morbid obesity) ([place]) 10/26/2016  . OSA (obstructive sleep apnea) 12/02/2016  . Type 2 diabetes mellitus with diabetic neuropathy (HCC)     Current Outpatient Medications  Medication Sig Dispense Refill  . amLODipine (NORVASC) 5 MG tablet Take 1 tablet (5 mg total) by mouth daily. 90 tablet 1  . blood glucose meter kit and supplies KIT Dispense based on patient and insurance preference. Use up to four times daily as directed. (FOR ICD-9 250.00, 250.01). For QAC - HS accuchecks. May switch to any brand. 1 each 1  . carvedilol (COREG) 12.5 MG tablet Take 1.5 tablets (18.75 mg total) by mouth 2 (two) times daily with a meal. 90 tablet 3  . digoxin (LANOXIN) 0.125 MG tablet Take  1 tablet (0.125 mg total) by mouth daily. 90 tablet 3  . empagliflozin (JARDIANCE) 25 MG TABS tablet Take 25 mg by mouth daily. 90 tablet 1  . gabapentin (NEURONTIN) 300 MG capsule Take 300 mg by mouth as needed.    Marland Kitchen glucose blood (CONTOUR NEXT TEST) test strip Use as instructed additional refills would need to come from PCP (Dr Gilford Rile) 100 each 0  . metolazone (ZAROXOLYN) 2.5 MG tablet Take 1 tablet (2.5 mg total) by mouth daily as needed. As directed by HF clinic for weight gain of 3 lbs overnight or 5 lbs within one  week. 10 tablet 2  . potassium chloride SA (KLOR-CON) 20 MEQ tablet Take 2 tablets (40 mEq total) by mouth as needed. When you take Metolazone 10 tablet 3  . sacubitril-valsartan (ENTRESTO) 97-103 MG Take 1 tablet by mouth 2 (two) times daily. 60 tablet 6  . sitaGLIPtin (JANUVIA) 100 MG tablet Take 1 tablet (100 mg total) by mouth daily. 90 tablet 1  . spironolactone (ALDACTONE) 25 MG tablet Take 1 tablet (25 mg total) by mouth daily. 90 tablet 3  . torsemide (DEMADEX) 20 MG tablet Take 3 tablets (60 mg total) by mouth daily. 90 tablet 3   No current facility-administered medications for this encounter.    Allergies  Allergen Reactions  . Hydrocodone     Hives       Social History   Socioeconomic History  . Marital status: Married    Spouse name: Not on file  . Number of children: Not on file  . Years of education: Not on file  . Highest education level: Not on file  Occupational History  . Not on file  Tobacco Use  . Smoking status: Never Smoker  . Smokeless tobacco: Never Used  Vaping Use  . Vaping Use: Never used  Substance and Sexual Activity  . Alcohol use: No  . Drug use: No  . Sexual activity: Never  Other Topics Concern  . Not on file  Social History Narrative  . Not on file   Social Determinants of Health   Financial Resource Strain: Medium Risk  . Difficulty of Paying Living Expenses: Somewhat hard  Food Insecurity: No Food Insecurity  . Worried About Charity fundraiser in the Last Year: Never true  . Ran Out of Food in the Last Year: Never true  Transportation Needs: No Transportation Needs  . Lack of Transportation (Medical): No  . Lack of Transportation (Non-Medical): No  Physical Activity: Not on file  Stress: Not on file  Social Connections: Not on file  Intimate Partner Violence: Not on file      Family History  Problem Relation Age of Onset  . Diabetes Mellitus II Mother   . Heart failure Mother   . Hypertension Mother   . Heart disease  Mother   . Heart failure Father   . Kidney disease Father   . Heart disease Father   . Congestive Heart Failure Neg Hx     Vitals:   01/24/20 0932  BP: 102/60  Pulse: 98  SpO2: 97%  Weight: (!) 173.7 kg (383 lb)    Wt Readings from Last 3 Encounters:  01/24/20 (!) 173.7 kg (383 lb)  01/22/20 (!) 171.5 kg (378 lb)  01/17/20 (!) 186.5 kg (411 lb 3.2 oz)   PHYSICAL EXAM: General:  Obese young male. In wheelchair NAD HEENT: normal Neck: supple. Difficult to assess JVD due to neck circumference. Carotids 2+ bilat; no bruits.  No lymphadenopathy or thryomegaly appreciated. Cor: PMI nondisplaced. Regular rate & rhythm. No rubs, gallops or murmurs. Lungs: diminished Abdomen: obese, distended, soft. No hepatosplenomegaly. No bruits or masses. Good bowel sounds. Extremities: no cyanosis, clubbing, rash, trace-1+ Bilateral LE edema L>R Neuro: alert & oriented x 3, cranial nerves grossly intact. moves all 4 extremities w/o difficulty. Affect pleasant   ASSESSMENT & PLAN:  1. Chronic systolic CHF due to NICM:  - normal cors by cath in [place] in 2017.  - Echo 10/2016 EF 30-35%.  - Echo 02/2017 LVEF 55% with complete recovery. - ECHO 04/2019 EF back down to 30-35%  In setting of stopping meds - Echo 12/03/19 EF 30-35% with moderate RV dysfunction in setting of recurrent RBBB with significant dyssynchrony. Refer to EP for ICD/CRT-D.  - GDMT resumed 03/2019. - NYHA III.  Volume status better, still has some mild fluid. - OK to take metolazone 2.5 mg + 40 mEq KCl 1-2x/month if needed (weight gain >5 lbs from baseline) - OK to take torsemide 80 mg daily + 40 mEq KCl (if baseline weight is up <5 lbs from baseline) - Continue torsemide 60 mg (stressed importance of taking this every day)   - Continue Entresto to 97/103 mg BID.  - Continue Coreg to 18.75 mg bid.  - Continue spiro 25 mg daily.  - Continue Digoxin. - Continue Jardiance 10 mg daily.  - Consider Bidil as BP tolerates at next visit. -  Still struggling with NYHA III symptoms despite excellent GDMT. Refer for ICD once volume improved. Has significant dyssynchrony on echo in setting of RBBB so likely no role for CRT but will ask EP to consider.  - BMET & Digoxin level today. - Will need close follow up in 3-4 weeks if weight continues to be labile  2. HTN - BP soft today. - Stop amlodipine. - Consider adding low-dose Bidil as BP tolerates.   3. DM2  - Management per PCP. - most recent hgb a1c 10.4  on 04/09/19.  - Continue Jardiance.  - No GU symptoms.   4. Morbid Obesity -Body mass index is 49.17 kg/m.  - Needs to lose weight.  - Increase activity and considering Prep Program  5. OSA/OHS - Severe OSA AHI 65.2/hr. - Bipap titration recommended. - Followed by Dr. Radford Pax.  6. Charcot's foot - Left - No wounds.  7. H/o Noncompliance - Paramedicine following.  Glori Bickers, MD 01/24/20

## 2020-01-24 NOTE — Telephone Encounter (Signed)
APPROVED FROM 01/23/20 - 04/22/20; CASE # [id number].

## 2020-01-27 ENCOUNTER — Other Ambulatory Visit (HOSPITAL_COMMUNITY): Payer: Self-pay | Admitting: Internal Medicine

## 2020-01-31 ENCOUNTER — Telehealth (HOSPITAL_COMMUNITY): Payer: Self-pay

## 2020-01-31 NOTE — Telephone Encounter (Signed)
Pt contacted me wanting to resch our appoint today.  He said he was not feeling well, he has cough, runny nose.  I told him to get a covid test. He denies increased sob. He has stopped the amlodipine per clinic visit last week. Although he has not been taking the torsemide daily.  He states he cannot tolerate it every day.  His weight today is 379.  He reports taking the 18.75mg  BID of carvedilol.  I will f/u with him next week.   Kerry Hough, EMT -Paramedic  01/31/20

## 2020-02-05 NOTE — Telephone Encounter (Addendum)
Patient is scheduled for BiPAP Titration on 03/13/19. Patient understands his titration study will be done at [facility] sleep lab. Patient understands he will receive a letter in a week or so detailing appointment, date, time, and location. Patient understands to call if he does not receive the letter  in a timely manner. Patient agrees with treatment and thanked me for call. Scheduled with leah.

## 2020-02-06 ENCOUNTER — Other Ambulatory Visit (HOSPITAL_COMMUNITY): Payer: Self-pay

## 2020-02-06 NOTE — Progress Notes (Signed)
Paramedicine Encounter    Patient ID: [name], male    DOB: 1985/08/16, 34 y.o.   MRN: [id number]   Patient Care Team: [name], MD as PCP - General Sueanne Margarita, MD as PCP - Cardiology (Cardiology) Bensimhon, Shaune Pascal, MD as PCP - Advanced Heart Failure (Cardiology)  Patient Active Problem List   Diagnosis Date Noted  . Candidiasis of penis 12/08/2018  . Hives 07/10/2018  . Acute pain of right knee 12/16/2017  . Depression 11/06/2017  . Charcot ankle, left 05/31/2017  . Acute pain of left foot 05/03/2017  . Diabetic polyneuropathy associated with type 2 diabetes mellitus ([place]) 03/14/2017  . OSA (obstructive sleep apnea) 12/02/2016  . Chronic systolic (congestive) heart failure ([place])   . Morbid obesity with BMI of 50.0-59.9, adult ([place]) 10/26/2016  . Asthma, chronic, mild persistent, uncomplicated 13/24/4010  . Essential hypertension 10/26/2016  . Diabetes mellitus type 2 in obese ([place]) 04/20/2016    Current Outpatient Medications:  .  blood glucose meter kit and supplies KIT, Dispense based on patient and insurance preference. Use up to four times daily as directed. (FOR ICD-9 250.00, 250.01). For QAC - HS accuchecks. May switch to any brand., Disp: 1 each, Rfl: 1 .  carvedilol (COREG) 12.5 MG tablet, Take 1.5 tablets (18.75 mg total) by mouth 2 (two) times daily with a meal., Disp: 90 tablet, Rfl: 3 .  digoxin (LANOXIN) 0.125 MG tablet, Take 1 tablet (0.125 mg total) by mouth daily., Disp: 90 tablet, Rfl: 3 .  empagliflozin (JARDIANCE) 25 MG TABS tablet, Take 25 mg by mouth daily., Disp: 90 tablet, Rfl: 1 .  gabapentin (NEURONTIN) 300 MG capsule, Take 300 mg by mouth as needed., Disp: , Rfl:  .  sacubitril-valsartan (ENTRESTO) 97-103 MG, Take 1 tablet by mouth 2 (two) times daily., Disp: 60 tablet, Rfl: 6 .  sitaGLIPtin (JANUVIA) 100 MG tablet, Take 1 tablet (100 mg total) by mouth daily., Disp: 90 tablet, Rfl: 1 .  spironolactone (ALDACTONE) 25 MG tablet, Take 1  tablet (25 mg total) by mouth daily., Disp: 90 tablet, Rfl: 3 .  torsemide (DEMADEX) 20 MG tablet, TAKE 3 TABLETS BY MOUTH EVERY DAY, Disp: 270 tablet, Rfl: 1 .  glucose blood (CONTOUR NEXT TEST) test strip, Use as instructed additional refills would need to come from PCP (Dr Gilford Rile) (Patient not taking: Reported on 02/06/2020), Disp: 100 each, Rfl: 0 .  KLOR-CON M20 20 MEQ tablet, TAKE 2 TABLETS BY MOUTH AS NEEDED. WHEN YOU TAKE METOLAZONE (Patient not taking: Reported on 02/06/2020), Disp: 10 tablet, Rfl: 3 .  metolazone (ZAROXOLYN) 2.5 MG tablet, Take 1 tablet (2.5 mg total) by mouth daily as needed (1-2 times per month). As directed by HF clinic for weight gain of 3 lbs overnight or 5 lbs within one week. (Patient not taking: Reported on 02/06/2020), Disp: 10 tablet, Rfl: 2 Allergies  Allergen Reactions  . Hydrocodone     Hives       Social History   Socioeconomic History  . Marital status: Married    Spouse name: Not on file  . Number of children: Not on file  . Years of education: Not on file  . Highest education level: Not on file  Occupational History  . Not on file  Tobacco Use  . Smoking status: Never Smoker  . Smokeless tobacco: Never Used  Vaping Use  . Vaping Use: Never used  Substance and Sexual Activity  . Alcohol use: No  . Drug use: No  .  Sexual activity: Never  Other Topics Concern  . Not on file  Social History Narrative  . Not on file   Social Determinants of Health   Financial Resource Strain: Medium Risk  . Difficulty of Paying Living Expenses: Somewhat hard  Food Insecurity: No Food Insecurity  . Worried About Charity fundraiser in the Last Year: Never true  . Ran Out of Food in the Last Year: Never true  Transportation Needs: No Transportation Needs  . Lack of Transportation (Medical): No  . Lack of Transportation (Non-Medical): No  Physical Activity: Not on file  Stress: Not on file  Social Connections: Not on file  Intimate Partner  Violence: Not on file    Physical Exam      Future Appointments  Date Time Provider [place]  02/19/2020  3:20 PM Sueanne Margarita, MD [facility]  02/25/2020 11:30 AM [facility] PA/NP [facility] None  03/12/2020  8:00 PM Sueanne Margarita, MD [facility]    BP 102/76   Pulse 96   Temp 97.7 F (36.5 C)   Resp 20   Wt (!) 378 lb (171.5 kg)   SpO2 96%   BMI 48.53 kg/m  CBG EMS-289 Weight yesterday-380 Last visit weight-383 @ CLINIC   He said he is feeling ok. He states when he wakes up in the morning time feeling nauseated and he thinks it is due to his lack of CPAP, he is sch for that in January. He still is self dosing for his torsemide.  His weight fluctuates between 378-381. He did not take torsemide today as he thought he was going out somewhere but didn't yet.   Out of a week he would take torsemide about every other day. He tries to take it daily but not always.  Still confused as to why he was taking 53m BID and then it got changed to 18.75 BID-it had both dosages to his list and it didn't match what was in the provider note so jasmine asked dr mAundra Dubinwhich one he wanted it on and it was the 18.779mBID.  Has not been checking his blood sugars. He has trouble doing that so he just quit checking. He is looking for new PCP. I told him to call [facility] since they are closest to him and see if they are accepting new patients. I gave him the number and he said he would call.   KaMarylouise StacksEMHuntleyoBaptist Health Richmond[facility]amedic  02/06/20

## 2020-02-19 ENCOUNTER — Ambulatory Visit: Payer: Medicaid Other | Admitting: Cardiology

## 2020-02-20 ENCOUNTER — Encounter: Payer: Medicaid Other | Admitting: Cardiology

## 2020-02-20 NOTE — Progress Notes (Signed)
This encounter was created in error - please disregard.

## 2020-02-24 NOTE — Progress Notes (Signed)
Advanced Heart Failure Clinic Note   PCP: Dolan Amen, MD HF Cardiologist: Dr. Gala Romney   HPI:  Jonathan [name] is a 36 y.o. male with history of systolic HF due to NICM, HTN, DM 2, Asthma, OSA, and morbid obesity (Body mass index is Body mass index is 49.05 kg/m.)  Admitted 9/18 with progressive SOB x 2 weeks. He reports h/o systolic CHF with EF 15-20% by Echo in [place] in 2017. Cath around that time without any significant CAD per patient. He was diuresed with IV Lasix, discharge weight was 373 pounds. He had run out of most of his medications due to moving to [place] and not having insurance. Meds restarted at discharge: Coreg, Westley Foots, Lasix.   He had repeat Echo 03/02/2017. EF had normalized to 55%.   He has a history of poor compliance.  He was previously being followed by paramedicine program.  He was lost to follow-up and not seen in the advanced heart failure clinic since 2019.  Also previously followed by Dr. Mayford Knife for sleep apnea but also lost to follow-up.  Non compliant with CPAP.  Reports he cannot tolerate current facemask.  He has recently been followed by [facility].  He was evaluated 2/21 after he developed progressive lower extremity edema and dyspnea on exertion after running out of his diuretic for approximately 2 months. NYHA IIIB.Marland Kitchen He was restarted on torsemide and continued on Entresto, carvedilol, spironolactone and Jardiance and referred back to the [facility].   12/2 follow up for weight gain of 28 lbs and SOB due to dietary indiscretion and diuretic noncompliance. Instructed to take metolazone 2.5 mg x 3 days + extra 40 KCl and set up with paramedicine.   Today he returns for HF follow up. Overall feeling fine. SOB after walking 10 steps. Denies PND/Orthopnea. Waiting to get another sleep study. Appetite ok. No fever or chills. Weight at home has been stable. Taking all medications but he didn't have meds this morning.   Echo 10/21: EF 30-35% with moderate RV  dysfunction in setting of recurrent RBBB with significant dyssynchrony. Reviewed by Dr Gala Romney.  ECHO 04/2019: EF 30-35%  ECHO 2019: EF 55%    Past Medical History:  Diagnosis Date  . Acute pain of left foot 05/03/2017  . Asthma   . Asthma, chronic, mild persistent, uncomplicated 10/26/2016  . Charcot ankle, left 05/31/2017  . CHF (congestive heart failure) (HCC)   . Chronic systolic (congestive) heart failure (HCC)   . Diabetes mellitus type 2 in obese (HCC) 04/20/2016  . Diabetic polyneuropathy associated with type 2 diabetes mellitus (HCC) 03/14/2017  . Essential hypertension 10/26/2016  . Hypertension   . Obesity   . Obesity, Class III, BMI 40-49.9 (morbid obesity) (HCC) 10/26/2016  . OSA (obstructive sleep apnea) 12/02/2016  . Type 2 diabetes mellitus with diabetic neuropathy (HCC)     Current Outpatient Medications  Medication Sig Dispense Refill  . blood glucose meter kit and supplies KIT Dispense based on patient and insurance preference. Use up to four times daily as directed. (FOR ICD-9 250.00, 250.01). For QAC - HS accuchecks. May switch to any brand. 1 each 1  . carvedilol (COREG) 12.5 MG tablet Take 1.5 tablets (18.75 mg total) by mouth 2 (two) times daily with a meal. 90 tablet 3  . digoxin (LANOXIN) 0.125 MG tablet Take 1 tablet (0.125 mg total) by mouth daily. 90 tablet 3  . empagliflozin (JARDIANCE) 25 MG TABS tablet Take 25 mg by mouth daily. 90 tablet 1  .  gabapentin (NEURONTIN) 300 MG capsule Take 300 mg by mouth as needed.    Marland Kitchen KLOR-CON M20 20 MEQ tablet TAKE 2 TABLETS BY MOUTH AS NEEDED. WHEN YOU TAKE METOLAZONE 10 tablet 3  . metolazone (ZAROXOLYN) 2.5 MG tablet Take 1 tablet (2.5 mg total) by mouth daily as needed (1-2 times per month). As directed by HF clinic for weight gain of 3 lbs overnight or 5 lbs within one week. 10 tablet 2  . sacubitril-valsartan (ENTRESTO) 97-103 MG Take 1 tablet by mouth 2 (two) times daily. 60 tablet 6  . sitaGLIPtin (JANUVIA) 100 MG  tablet Take 1 tablet (100 mg total) by mouth daily. 90 tablet 1  . spironolactone (ALDACTONE) 25 MG tablet Take 1 tablet (25 mg total) by mouth daily. 90 tablet 3  . torsemide (DEMADEX) 20 MG tablet TAKE 3 TABLETS BY MOUTH EVERY DAY 270 tablet 1   No current facility-administered medications for this encounter.    Allergies  Allergen Reactions  . Hydrocodone     Hives       Social History   Socioeconomic History  . Marital status: Married    Spouse name: Not on file  . Number of children: Not on file  . Years of education: Not on file  . Highest education level: Not on file  Occupational History  . Not on file  Tobacco Use  . Smoking status: Never Smoker  . Smokeless tobacco: Never Used  Vaping Use  . Vaping Use: Never used  Substance and Sexual Activity  . Alcohol use: No  . Drug use: No  . Sexual activity: Never  Other Topics Concern  . Not on file  Social History Narrative  . Not on file   Social Determinants of Health   Financial Resource Strain: Medium Risk  . Difficulty of Paying Living Expenses: Somewhat hard  Food Insecurity: No Food Insecurity  . Worried About Programme researcher, broadcasting/film/video in the Last Year: Never true  . Ran Out of Food in the Last Year: Never true  Transportation Needs: No Transportation Needs  . Lack of Transportation (Medical): No  . Lack of Transportation (Non-Medical): No  Physical Activity: Not on file  Stress: Not on file  Social Connections: Not on file  Intimate Partner Violence: Not on file      Family History  Problem Relation Age of Onset  . Diabetes Mellitus II Mother   . Heart failure Mother   . Hypertension Mother   . Heart disease Mother   . Heart failure Father   . Kidney disease Father   . Heart disease Father   . Congestive Heart Failure Neg Hx     Vitals:   02/25/20 1128  BP: 130/90  Pulse: (!) 102  SpO2: 96%  Weight: (!) 173.3 kg (382 lb)    Wt Readings from Last 3 Encounters:  02/25/20 (!) 173.3 kg (382  lb)  02/06/20 (!) 171.5 kg (378 lb)  01/24/20 (!) 173.7 kg (383 lb)   General:  Walked in the clinic. Marland Kitchen No resp difficulty HEENT: normal Neck: supple. JVP 7-8. Carotids 2+ bilat; no bruits. No lymphadenopathy or thryomegaly appreciated. Cor: PMI nondisplaced. Regular rate & rhythm. No rubs, gallops or murmurs. Lungs: clear Abdomen: obese, soft, nontender, nondistended. No hepatosplenomegaly. No bruits or masses. Good bowel sounds. Extremities: no cyanosis, clubbing, rash, R and LLE trace edema Neuro: alert & orientedx3, cranial nerves grossly intact. moves all 4 extremities w/o difficulty. Affect pleasant  EKG: SR 100 BPM RBBB  136 ms   ASSESSMENT & PLAN:  1. Chronic systolic CHF due to NICM:  - normal cors by cath in [place] in 2017.  - Echo 10/2016 EF 30-35%.  - Echo 02/2017 LVEF 55% with complete recovery. - ECHO 04/2019 EF back down to 30-35%  In setting of stopping meds - Echo 12/03/19 EF 30-35% with moderate RV dysfunction in setting of recurrent RBBB with significant dyssynchrony.  - GDMT resumed 03/2019. - NYHA II.  Volume status stable. Continue torsemide 60 mg daily.  - Continue Entresto to 97/103 mg BID.  - Increase Coreg to 25 mg twice a day.  Left HF Paramedicine a message regarding medication change.  - Continue spiro 25 mg daily.  - Continue Digoxin. Check dig level.  - Continue Jardiance 10 mg daily.   2. HTN -A little higher but has not had medications.   3. Uncontrolled. DM2  - Management per PCP. - most recent hgb a1c 10.4  on 04/09/19.  - Continue Jardiance.   4. Morbid Obesity -Body mass index is 49.05 kg/m.  -Discussed low salt food choices.   5. OSA/OHS - Severe OSA AHI 65.2/hr. - Bipap titration recommended. - Followed by Dr. Mayford Knife.  6. Charcot's foot - Left - No wounds.   7. H/o Noncompliance - Paramedicine following.   Follow up in 6-8 weeks. Discussed medication changes.   Tonye Becket, NP 02/25/20

## 2020-02-25 ENCOUNTER — Encounter (HOSPITAL_COMMUNITY): Payer: Self-pay

## 2020-02-25 ENCOUNTER — Ambulatory Visit (HOSPITAL_COMMUNITY)
Admission: RE | Admit: 2020-02-25 | Discharge: 2020-02-25 | Disposition: A | Discharge status: Home / Self Care | Payer: Medicaid Other | Source: Ambulatory Visit | Attending: Adult Health | Admitting: Adult Health

## 2020-02-25 ENCOUNTER — Other Ambulatory Visit: Payer: Self-pay

## 2020-02-25 VITALS — BP 130/90 | HR 102 | Wt 382.0 lb

## 2020-02-25 DIAGNOSIS — Z9119 Patient's noncompliance with other medical treatment and regimen: Secondary | ICD-10-CM | POA: Diagnosis not present

## 2020-02-25 DIAGNOSIS — E11610 Type 2 diabetes mellitus with diabetic neuropathic arthropathy: Secondary | ICD-10-CM | POA: Insufficient documentation

## 2020-02-25 DIAGNOSIS — I4510 Unspecified right bundle-branch block: Secondary | ICD-10-CM | POA: Insufficient documentation

## 2020-02-25 DIAGNOSIS — I110 Hypertensive heart disease with heart failure: Secondary | ICD-10-CM | POA: Diagnosis not present

## 2020-02-25 DIAGNOSIS — Z833 Family history of diabetes mellitus: Secondary | ICD-10-CM | POA: Diagnosis not present

## 2020-02-25 DIAGNOSIS — Z885 Allergy status to narcotic agent status: Secondary | ICD-10-CM | POA: Insufficient documentation

## 2020-02-25 DIAGNOSIS — Z79899 Other long term (current) drug therapy: Secondary | ICD-10-CM | POA: Insufficient documentation

## 2020-02-25 DIAGNOSIS — G4733 Obstructive sleep apnea (adult) (pediatric): Secondary | ICD-10-CM | POA: Insufficient documentation

## 2020-02-25 DIAGNOSIS — I428 Other cardiomyopathies: Secondary | ICD-10-CM | POA: Diagnosis not present

## 2020-02-25 DIAGNOSIS — I10 Essential (primary) hypertension: Secondary | ICD-10-CM | POA: Diagnosis not present

## 2020-02-25 DIAGNOSIS — E1142 Type 2 diabetes mellitus with diabetic polyneuropathy: Secondary | ICD-10-CM | POA: Diagnosis not present

## 2020-02-25 DIAGNOSIS — I5022 Chronic systolic (congestive) heart failure: Secondary | ICD-10-CM | POA: Diagnosis not present

## 2020-02-25 DIAGNOSIS — Z6841 Body Mass Index (BMI) 40.0 and over, adult: Secondary | ICD-10-CM | POA: Insufficient documentation

## 2020-02-25 DIAGNOSIS — Z841 Family history of disorders of kidney and ureter: Secondary | ICD-10-CM | POA: Insufficient documentation

## 2020-02-25 DIAGNOSIS — Z9114 Patient's other noncompliance with medication regimen: Secondary | ICD-10-CM | POA: Insufficient documentation

## 2020-02-25 DIAGNOSIS — Z8249 Family history of ischemic heart disease and other diseases of the circulatory system: Secondary | ICD-10-CM | POA: Diagnosis not present

## 2020-02-25 DIAGNOSIS — Z7984 Long term (current) use of oral hypoglycemic drugs: Secondary | ICD-10-CM | POA: Insufficient documentation

## 2020-02-25 DIAGNOSIS — J45909 Unspecified asthma, uncomplicated: Secondary | ICD-10-CM | POA: Diagnosis not present

## 2020-02-25 LAB — BASIC METABOLIC PANEL
Anion gap: 14 (ref 5–15)
BUN: 12 mg/dL (ref 6–20)
CO2: 30 mmol/L (ref 22–32)
Calcium: 10.4 mg/dL — ABNORMAL HIGH (ref 8.9–10.3)
Chloride: 93 mmol/L — ABNORMAL LOW (ref 98–111)
Creatinine, Ser: 0.85 mg/dL (ref 0.61–1.24)
GFR, Estimated: >60 mL/min (ref >60)
Glucose, Bld: 281 mg/dL — ABNORMAL HIGH (ref 70–99)
Potassium: 4.5 mmol/L (ref 3.5–5.1)
Sodium: 137 mmol/L (ref 135–145)

## 2020-02-25 LAB — DIGOXIN LEVEL: Digoxin Level: 0.2 ng/mL — ABNORMAL LOW (ref 0.8–2.0)

## 2020-02-25 MED ORDER — CARVEDILOL 25 MG PO TABS: 25.0000 mg | ORAL_TABLET | Freq: Two times a day (BID) | ORAL | 11 refills | Status: DC

## 2020-02-25 MED ORDER — CARVEDILOL 25 MG PO TABS: 25.0000 mg | ORAL_TABLET | Freq: Two times a day (BID) | ORAL | 6 refills | Status: DC

## 2020-02-25 MED ORDER — CARVEDILOL 12.5 MG PO TABS: 18.7500 mg | ORAL_TABLET | Freq: Two times a day (BID) | ORAL | 3 refills | Status: DC

## 2020-02-25 NOTE — Patient Instructions (Addendum)
CHANGE Coreg to 25 mg, one tab twice a day    Labs today We will only contact you if something comes back abnormal or we need to make some changes. Otherwise no news is good news!  Your physician recommends that you schedule a follow-up appointment in: 6-8 weeks  in the [facility] ([facility]NP) [facility]    Do the following things EVERYDAY: 1) Weigh yourself in the morning before breakfast. Write it down and keep it in a log. 2) Take your medicines as prescribed 3) Eat low salt foods--Limit salt (sodium) to 2000 mg per day.  4) Stay as active as you can everyday 5) Limit all fluids for the day to less than 2 liters   If you have any questions or concerns before your next appointment please send [place] a message through [place] or call our office at [phone].    TO LEAVE A MESSAGE FOR THE NURSE SELECT OPTION 2, PLEASE LEAVE A MESSAGE INCLUDING: . YOUR NAME . DATE OF BIRTH . CALL BACK NUMBER . REASON FOR CALL**this is important as we prioritize the call backs  YOU WILL RECEIVE A CALL BACK THE SAME DAY AS LONG AS YOU CALL BEFORE 4:00 PM

## 2020-03-04 ENCOUNTER — Telehealth (HOSPITAL_COMMUNITY): Payer: Self-pay

## 2020-03-06 NOTE — Telephone Encounter (Signed)
Reached out to pt for a home visit, no answer.  Will try again next week.   Kerry Hough, EMT-Paramedic  03/06/20

## 2020-03-10 ENCOUNTER — Telehealth: Payer: Self-pay | Admitting: *Deleted

## 2020-03-10 NOTE — Telephone Encounter (Addendum)
Call from pt's wife - stated since Saturday, pt has been c/o abd pain, nausea, body aches, trouble breathing when lying down, legs/feet swollen so she put Teds hose on pt (not as swollen), dry cough. Denies fever, chills, diarrhea. Pt has not been vaccinated. Stated [facility]

## 2020-03-10 NOTE — Telephone Encounter (Signed)
Talked to Dr Antony Contras, Attending- stated pt needs to go to the ED.  I called pt's wife back - informed to take pt to the ED. She was not happy with this decision; stated she did not want to expose him to the ED and she will call the Heart failure clinic.

## 2020-03-10 NOTE — Telephone Encounter (Signed)
Thank you. We can offer a tele health appointment but I'm not sure what we can offer him over the phone. Sounds like he needs in person evaluation for HF exacerbation and COVID rule out.

## 2020-03-11 ENCOUNTER — Other Ambulatory Visit: Payer: Self-pay

## 2020-03-11 ENCOUNTER — Ambulatory Visit (INDEPENDENT_AMBULATORY_CARE_PROVIDER_SITE_OTHER): Payer: Medicaid Other | Admitting: Internal Medicine

## 2020-03-11 ENCOUNTER — Encounter: Payer: Self-pay | Admitting: Internal Medicine

## 2020-03-11 VITALS — BP 122/70 | HR 73 | Temp 97.9°F | Wt 391.0 lb

## 2020-03-11 DIAGNOSIS — E1142 Type 2 diabetes mellitus with diabetic polyneuropathy: Secondary | ICD-10-CM | POA: Diagnosis not present

## 2020-03-11 DIAGNOSIS — I5022 Chronic systolic (congestive) heart failure: Secondary | ICD-10-CM | POA: Diagnosis not present

## 2020-03-11 DIAGNOSIS — E1169 Type 2 diabetes mellitus with other specified complication: Secondary | ICD-10-CM

## 2020-03-11 DIAGNOSIS — G4733 Obstructive sleep apnea (adult) (pediatric): Secondary | ICD-10-CM

## 2020-03-11 DIAGNOSIS — E669 Obesity, unspecified: Secondary | ICD-10-CM | POA: Diagnosis not present

## 2020-03-11 LAB — POCT GLYCOSYLATED HEMOGLOBIN (HGB A1C): Hemoglobin A1C: 11.6 % — AB (ref 4.0–5.6)

## 2020-03-11 LAB — GLUCOSE, CAPILLARY: Glucose-Capillary: 324 mg/dL — ABNORMAL HIGH (ref 70–99)

## 2020-03-11 NOTE — Patient Instructions (Addendum)
Mr Maler,  It was a pleasure seeing you in clinic. Today we discussed:   Heart failure: At this time, please add metolazone 2.5mg  daily to the torsemide 60mg  daily. Please follow up in 1 week.   Diabetes:  Your A1c is 11.2 at this visit. I am sending a prescription for Rybelsus 3mg  daily. Please let know if you have any significant side effects from this. I would also strongly encourage you to consider insulin as this will be the best way to improve your blood sugar levels.   If you have any questions or concerns, please call our clinic at [phone] between 9am-5pm and after hours call [phone] and ask for the internal medicine resident on call. If you feel you are having a medical emergency please call 911.   Thank you, we look forward to helping you remain healthy!

## 2020-03-11 NOTE — Progress Notes (Signed)
   CC: weight gain, abdominal pain, diabetes f/u  HPI:  Jonathan [name] is a 35 y.o. male with PMHx as listed below presenting for follow up of his diabetes and due to concerns for recent weight gain. Please see problem based charting for complete assessment and plan.  Past Medical History:  Diagnosis Date  . Acute pain of left foot 05/03/2017  . Asthma   . Asthma, chronic, mild persistent, uncomplicated 10/26/2016  . Charcot ankle, left 05/31/2017  . CHF (congestive heart failure) (HCC)   . Chronic systolic (congestive) heart failure (HCC)   . Diabetes mellitus type 2 in obese (HCC) 04/20/2016  . Diabetic polyneuropathy associated with type 2 diabetes mellitus (HCC) 03/14/2017  . Essential hypertension 10/26/2016  . Hypertension   . Obesity   . Obesity, Class III, BMI 40-49.9 (morbid obesity) (HCC) 10/26/2016  . OSA (obstructive sleep apnea) 12/02/2016  . Type 2 diabetes mellitus with diabetic neuropathy (HCC)    Review of Systems:  Negative except as stated in HPI.  Physical Exam:  Vitals:   03/11/20 1435  BP: 122/70  Pulse: 73  Temp: 97.9 F (36.6 C)  TempSrc: Oral  SpO2: 95%   Physical Exam  Constitutional: Obese male; No acute distress.  HENT: Normocephalic and atraumatic, EOMI, conjunctiva normal, moist mucous membranes; difficult to assess JVD given body habitus Cardiovascular: Normal rate, regular rhythm, distant heart sounds due to body habitus, S1 and S2 present, no murmurs, rubs, gallops.  Distal pulses intact Respiratory: No respiratory distress, no accessory muscle use.  Effort is normal.  Lungs are clear to auscultation bilaterally. GI: Nondistended, soft, minimal tenderness to palpation in the epigastric region, indurated pannus; active bowel sounds Musculoskeletal: Normal bulk and tone.  2+ pitting edema to bilateral mid tibia  Skin: Warm and dry.  No rash, erythema, lesions noted.  Assessment & Plan:   See Encounters Tab for problem based charting.  Patient  discussed with Dr. Oswaldo Done

## 2020-03-12 ENCOUNTER — Encounter (HOSPITAL_BASED_OUTPATIENT_CLINIC_OR_DEPARTMENT_OTHER): Payer: Self-pay | Admitting: *Deleted

## 2020-03-12 ENCOUNTER — Other Ambulatory Visit: Payer: Self-pay

## 2020-03-12 ENCOUNTER — Ambulatory Visit (HOSPITAL_BASED_OUTPATIENT_CLINIC_OR_DEPARTMENT_OTHER): Payer: Medicaid Other | Attending: Cardiology | Admitting: Cardiology

## 2020-03-12 DIAGNOSIS — G4733 Obstructive sleep apnea (adult) (pediatric): Secondary | ICD-10-CM | POA: Diagnosis not present

## 2020-03-12 MED ORDER — RYBELSUS 3 MG PO TABS: 3.0000 mg | ORAL_TABLET | Freq: Every day | ORAL | 3 refills | Status: DC

## 2020-03-12 MED ORDER — RYBELSUS 7 MG PO TABS: 7.0000 mg | ORAL_TABLET | Freq: Every day | ORAL | 1 refills | Status: DC

## 2020-03-12 NOTE — Assessment & Plan Note (Addendum)
HbA1c is up to 11.2 from 10.4 at last  Visit. He has been on Januvia 100mg  daily and Jardiance 25mg  daily. He notes compliance with medications. Chart review indicates that patient is also supposed to be on semaglutide; however, he has not been taking this. Discussed with patient recommendation to start insulin for improved glucose control. He is hesitant to do so at this time as he endorses previously having his joints "lock up" with injecting insulin. I advised him to strongly consider starting insulin at this time given his uncontrolled diabetes.   Plan: - Continue Jardiance 25mg  daily - Start Rybelsus 7mg  daily, uptitrate as tolerated  - Discontinue Januvia 100mg  daily - Continue discussion regarding recommendation for starting basal-bolus therapy at next visit

## 2020-03-12 NOTE — Assessment & Plan Note (Addendum)
Patient endorses bilateral foot pain that is worse with walking. Diabetic foot exam done at this visit without acute findings. ABI's wnl. Suspect his pain is secondary to neuropathy in setting of uncontrolled diabetes.   Plan: Continue gabapentin 300mg  qHS  If persistent, can increase dosing

## 2020-03-12 NOTE — Assessment & Plan Note (Signed)
Patient with Hx of NYHA Class II-III HFrEF with most recent  Echo 12/03/19 significant for EF 30-35% with moderate RV dysfunction in setting of recurrent RBBB with significant dyssynchrony. He is on carvedilol 25mg  bid, Entresto 97-103mg  bid, aldactone 25mg  daily, digoxin 0.125mg  daily, torsemide 60mg  daily and metolazone 2.5mg  daily prn. Patient is followed by [facility]. He notes that over the past several weeks, he has had weight gain despite taking his torsemide as prescribed. He denies any dietary indiscretion. Notes that his dry weight is ~380lbs and is 391lbs at this visit. He does have 2+ pitting edema to bilateral lower extremities and indurated pannus. However, it is difficult to assess JVD due to body habitus. He has metolazone at home but has not been taking this recently.   Plan: Continue current regimen Patient advised to take metolazone 2.5mg  daily in addition to his torsemide 60mg  daily  F/u in 1 week to assess volume status

## 2020-03-12 NOTE — Assessment & Plan Note (Signed)
Patient has sleep study scheduled for 03/13/2019. Advised to keep his appointment.

## 2020-03-13 ENCOUNTER — Telehealth (HOSPITAL_COMMUNITY): Payer: Self-pay | Admitting: Pharmacist

## 2020-03-13 ENCOUNTER — Other Ambulatory Visit (HOSPITAL_BASED_OUTPATIENT_CLINIC_OR_DEPARTMENT_OTHER): Payer: Self-pay

## 2020-03-13 ENCOUNTER — Telehealth: Payer: Self-pay | Admitting: *Deleted

## 2020-03-13 DIAGNOSIS — G4733 Obstructive sleep apnea (adult) (pediatric): Secondary | ICD-10-CM

## 2020-03-13 NOTE — Telephone Encounter (Signed)
Patient Advocate Encounter   Received notification from [facility] that prior authorization for Sherryll Burger is required.   PA submitted on CoverMyMeds Key [id number] Status is pending   Will continue to follow.   Karle Plumber, PharmD, BCPS, BCCP, CPP Heart Failure Clinic Pharmacist [phone]'

## 2020-03-13 NOTE — Telephone Encounter (Signed)
Information was called to [facility] for Rybelsus 7 mg tablets.  Approved 03/13/2020 thru 03/13/2021.  PA [id number].  [facility] was called at [phone] and informed of.  Angelina Ok, RN 03/13/2020 10:05 AM.

## 2020-03-13 NOTE — Progress Notes (Signed)
Internal Medicine Clinic Attending ° °Case discussed with Dr. Aslam  At the time of the visit.  We reviewed the resident’s history and exam and pertinent patient test results.  I agree with the assessment, diagnosis, and plan of care documented in the resident’s note.  °

## 2020-03-13 NOTE — Telephone Encounter (Signed)
Advanced Heart Failure Patient Advocate Encounter  Prior Authorization for Sherryll Burger has been approved.    PA# [id number] Effective dates: 03/13/20 through 03/13/21  Karle Plumber, PharmD, BCPS, BCCP, CPP Heart Failure Clinic Pharmacist [phone]

## 2020-03-17 ENCOUNTER — Telehealth (HOSPITAL_COMMUNITY): Payer: Self-pay

## 2020-03-19 NOTE — Telephone Encounter (Signed)
Began exchanging texts regarding home visit this week, I offered some time Wednesday and never had a response back.   Kerry Hough, EMT-Paramedic  03/19/20

## 2020-03-20 ENCOUNTER — Telehealth: Payer: Medicaid Other | Admitting: Cardiology

## 2020-03-21 ENCOUNTER — Ambulatory Visit: Payer: BLUE CROSS/BLUE SHIELD | Admitting: Podiatry

## 2020-03-24 ENCOUNTER — Encounter (HOSPITAL_COMMUNITY): Payer: Self-pay | Admitting: *Deleted

## 2020-03-24 NOTE — Progress Notes (Signed)
Pt's wife exceeded her amount of FMLA time caring for pt, forms updated and faxed back to [employer] at [phone], pt's wife aware, copy left at front desk for her to p/u.

## 2020-03-25 ENCOUNTER — Ambulatory Visit: Payer: Medicaid Other | Admitting: Podiatry

## 2020-03-26 ENCOUNTER — Encounter: Payer: Medicaid Other | Admitting: Cardiology

## 2020-03-26 ENCOUNTER — Encounter: Payer: Self-pay | Admitting: Cardiology

## 2020-03-29 NOTE — Progress Notes (Signed)
This encounter was created in error - please disregard.

## 2020-03-30 NOTE — Procedures (Signed)
   Patient Name: Jonathan [name], Jonathan [name] Date: 03/12/2020 Gender: Male D.O.B: [birth year] Age (years): 74 Referring Provider: Armanda Magic MD, ABSM Height (inches): 74 Interpreting Physician: Armanda Magic MD, ABSM Weight (lbs): 391 RPSGT: Ulyess Mort BMI: 50 MRN: [id number] Neck Size: 20.00  CLINICAL INFORMATION The patient is referred for a BiPAP titration to treat sleep apnea.  SLEEP STUDY TECHNIQUE As per the AASM Manual for the Scoring of Sleep and Associated Events v2.3 (April 2016) with a hypopnea requiring 4% desaturations.  The channels recorded and monitored were frontal, central and occipital EEG, electrooculogram (EOG), submentalis EMG (chin), nasal and oral airflow, thoracic and abdominal wall motion, anterior tibialis EMG, snore microphone, electrocardiogram, and pulse oximetry. Bilevel positive airway pressure (BPAP) was initiated at the beginning of the study and titrated to treat sleep-disordered breathing.  MEDICATIONS Medications self-administered by patient taken the night of the study : ENTRESTO, CARVEDILOL, SPIRONOLACTONE, JANUMET, TORSEMIDE, zzzQuil  RESPIRATORY PARAMETERS Optimal IPAP Pressure (cm): 22  AHI at Optimal Pressure (/hr) 0.0 Optimal EPAP Pressure (cm):18  Overall Minimal O2 (%):81.0  Minimal O2 at Optimal Pressure (%): 89.0  SLEEP ARCHITECTURE Start Time:10:59:42 PM  Stop Time:4:55:58 AM  Total Time (min):356.3  Total Sleep Time (min):207.5 Sleep Latency (min):30.1  Sleep Efficiency (%):58.2%  REM Latency (min):88.0  WASO (min):118.7 Stage N1 (%): 25.3%  Stage N2 (%): 68.2%  Stage N3 (%): 0.0%  Stage R (%):6.5 Supine (%):96.92  Arousal Index (/hr):20.8   CARDIAC DATA The 2 lead EKG demonstrated sinus rhythm. The mean heart rate was 90.6 beats per minute. Other EKG findings include: PVCs.  LEG MOVEMENT DATA The total Periodic Limb Movements of Sleep (PLMS) were 0. The PLMS index was 0.0. A PLMS index of <15 is considered normal in  adults.  IMPRESSIONS - An optimal PAP pressure was selected for this patient ( 22 / cm of water) - Central sleep apnea was not noted during this titration (CAI = 1.7/h). - Moderete oxygen desaturations were observed during this titration (min O2 = 81.0%). - The patient snored with moderate snoring volume. - 2-lead EKG demonstrated: PVCs - Clinically significant periodic limb movements were not noted during this study. Arousals associated with PLMs were significant.  DIAGNOSIS - Obstructive Sleep Apnea (G47.33)  RECOMMENDATIONS - Trial of auto BiPAP therapy with IPAP max 20cm H2O, EPAP min 6cm H2O and PS 5 cm H2O with a Medium size Fisher&Paykel Full Face Mask Simplus mask and heated humidification. - Avoid alcohol, sedatives and other CNS depressants that may worsen sleep apnea and disrupt normal sleep architecture. - Sleep hygiene should be reviewed to assess factors that may improve sleep quality. - Weight management and regular exercise should be initiated or continued. - Return to Sleep Center for re-evaluation after 6 weeks of therapy  [Electronically signed] 03/30/2020 03:00 PM  Armanda Magic MD, ABSM Diplomate, American Board of Sleep Medicine

## 2020-03-31 ENCOUNTER — Telehealth (HOSPITAL_COMMUNITY): Payer: Self-pay

## 2020-03-31 NOTE — Telephone Encounter (Signed)
Reached out to pt regarding home visit this week, waiting to hear back.   Kerry Hough, EMT-Paramedic  03/31/20

## 2020-04-02 ENCOUNTER — Other Ambulatory Visit: Payer: Self-pay

## 2020-04-02 ENCOUNTER — Telehealth (HOSPITAL_COMMUNITY): Payer: Self-pay | Admitting: Licensed Clinical Social Worker

## 2020-04-02 ENCOUNTER — Encounter: Payer: Self-pay | Admitting: Podiatry

## 2020-04-02 ENCOUNTER — Encounter: Payer: Self-pay | Admitting: General Practice

## 2020-04-02 ENCOUNTER — Ambulatory Visit (INDEPENDENT_AMBULATORY_CARE_PROVIDER_SITE_OTHER): Payer: Medicaid Other | Admitting: Podiatry

## 2020-04-02 DIAGNOSIS — E119 Type 2 diabetes mellitus without complications: Secondary | ICD-10-CM

## 2020-04-02 NOTE — Telephone Encounter (Signed)
Pt being DC'd from [employer] for lack of communication  Will continue to follow through clinic and assist as needed  Burna Sis, LCSW Clinical Social Worker Advanced [facility] Failure Clinic Desk#: [phone] Cell#: [phone]

## 2020-04-02 NOTE — Progress Notes (Signed)
Subjective: Jonathan [name] presents today referred by [name], MD for diabetic foot evaluation.  Patient relates 2  year history of diabetes.  Patient denies any history of foot wounds.  Patient has  history of numbness, tingling, burning, pins/needles sensations.  Past Medical History:  Diagnosis Date  . Acute pain of left foot 05/03/2017  . Asthma   . Asthma, chronic, mild persistent, uncomplicated 3/54/5625  . Charcot ankle, left 05/31/2017  . CHF (congestive heart failure) ([place])   . Chronic systolic (congestive) heart failure ([place])   . Diabetes mellitus type 2 in obese ([place]) 04/20/2016  . Diabetic polyneuropathy associated with type 2 diabetes mellitus ([place]) 03/14/2017  . Essential hypertension 10/26/2016  . Hypertension   . Obesity   . Obesity, Class III, BMI 40-49.9 (morbid obesity) ([place]) 10/26/2016  . OSA (obstructive sleep apnea) 12/02/2016  . Type 2 diabetes mellitus with diabetic neuropathy [facility])     Patient Active Problem List   Diagnosis Date Noted  . Candidiasis of penis 12/08/2018  . Depression 11/06/2017  . Charcot ankle, left 05/31/2017  . Acute pain of left foot 05/03/2017  . Diabetic polyneuropathy associated with type 2 diabetes mellitus ([place]) 03/14/2017  . OSA (obstructive sleep apnea) 12/02/2016  . Chronic systolic (congestive) heart failure ([place])   . Morbid obesity with BMI of 50.0-59.9, adult ([place]) 10/26/2016  . Asthma, chronic, mild persistent, uncomplicated 63/89/3734  . Essential hypertension 10/26/2016  . Diabetes mellitus type 2 in obese ([place]) 04/20/2016    Past Surgical History:  Procedure Laterality Date  . CARDIAC CATHETERIZATION    . FOOT SURGERY     arch surgery    Current Outpatient Medications on File Prior to Visit  Medication Sig Dispense Refill  . blood glucose meter kit and supplies KIT Dispense based on patient and insurance preference. Use up to four times daily as directed. (FOR ICD-9 250.00, 250.01). For QAC - HS  accuchecks. May switch to any brand. 1 each 1  . carvedilol (COREG) 25 MG tablet Take 1 tablet (25 mg total) by mouth 2 (two) times daily. 60 tablet 6  . digoxin (LANOXIN) 0.125 MG tablet Take 1 tablet (0.125 mg total) by mouth daily. 90 tablet 3  . empagliflozin (JARDIANCE) 25 MG TABS tablet Take 25 mg by mouth daily. 90 tablet 1  . gabapentin (NEURONTIN) 300 MG capsule Take 300 mg by mouth as needed.    Marland Kitchen KLOR-CON M20 20 MEQ tablet TAKE 2 TABLETS BY MOUTH AS NEEDED. WHEN YOU TAKE METOLAZONE 10 tablet 3  . metolazone (ZAROXOLYN) 2.5 MG tablet Take 1 tablet (2.5 mg total) by mouth daily as needed (1-2 times per month). As directed by HF clinic for weight gain of 3 lbs overnight or 5 lbs within one week. 10 tablet 2  . sacubitril-valsartan (ENTRESTO) 97-103 MG Take 1 tablet by mouth 2 (two) times daily. 60 tablet 6  . Semaglutide (RYBELSUS) 7 MG TABS Take 7 mg by mouth daily. 30 tablet 1  . spironolactone (ALDACTONE) 25 MG tablet Take 1 tablet (25 mg total) by mouth daily. 90 tablet 3  . torsemide (DEMADEX) 20 MG tablet TAKE 3 TABLETS BY MOUTH EVERY DAY 270 tablet 1   No current facility-administered medications on file prior to visit.     Allergies  Allergen Reactions  . Hydrocodone     Hives     Social History   Occupational History  . Not on file  Tobacco Use  . Smoking status: Never Smoker  . Smokeless  tobacco: Never Used  Vaping Use  . Vaping Use: Never used  Substance and Sexual Activity  . Alcohol use: No  . Drug use: No  . Sexual activity: Never    Family History  Problem Relation Age of Onset  . Diabetes Mellitus II Mother   . Heart failure Mother   . Hypertension Mother   . Heart disease Mother   . Heart failure Father   . Kidney disease Father   . Heart disease Father   . Congestive Heart Failure Neg Hx     Immunization History  Administered Date(s) Administered  . Influenza-Unspecified 11/10/2017    Review of systems: Positive Findings in bold  print.  Constitutional:  chills, fatigue, fever, sweats, weight change Communication: Optometrist, sign Ecologist, hand writing, iPad/Android device Head: headaches, head injury Eyes: changes in vision, eye pain, glaucoma, cataracts, macular degeneration, diplopia, glare,  light sensitivity, eyeglasses or contacts, blindness Ears nose mouth throat: hearing impaired, hearing aids,  ringing in ears, deaf, sign language,  vertigo, nosebleeds,  rhinitis,  cold sores, snoring, swollen glands Cardiovascular: HTN, edema, arrhythmia, pacemaker in place, defibrillator in place, chest pain/tightness, chronic anticoagulation, blood clot, heart failure, MI Peripheral Vascular: leg cramps, varicose veins, blood clots, lymphedema, varicosities Respiratory:  asthma, difficulty breathing, denies congestion, SOB, wheezing, cough, emphysema Gastrointestinal: change in appetite or weight, abdominal pain, constipation, diarrhea, nausea, vomiting, vomiting blood, change in bowel habits, abdominal pain, jaundice, rectal bleeding, hemorrhoids, GERD Genitourinary:  nocturia,  pain on urination, polyuria,  blood in urine, Foley catheter, urinary urgency, ESRD on hemodialysis Musculoskeletal: amputation, cramping, stiff joints, painful joints, decreased joint motion, fractures, OA, gout, hemiplegia, paraplegia, uses cane, wheelchair bound, uses walker, uses rollator Skin: +changes in toenails, color change, dryness, itching, mole changes,  rash, wound(s) Neurological: headaches, numbness in feet, paresthesias in feet, burning in feet, fainting,  seizures, change in speech, migraines, memory problems/poor historian, cerebral palsy, weakness, paralysis, CVA, TIA Endocrine: diabetes, hypothyroidism, hyperthyroidism,  goiter, dry mouth, flushing, heat intolerance, cold intolerance,  excessive thirst, denies polyuria,  nocturia Hematological:  easy bleeding, excessive bleeding, easy bruising, enlarged lymph nodes, on long  term blood thinner, history of past transusions Allergy/immunological:  hives, eczema, frequent infections, multiple drug allergies, seasonal allergies, transplant recipient, multiple food allergies Psychiatric:  anxiety, depression, mood disorder, suicidal ideations, hallucinations, insomnia  Objective: There were no vitals filed for this visit. Vascular Examination: Capillary refill time less than 3 seconds x 10 digits.  Dorsalis pedis pulses palpable 2 out of 4.  Posterior tibial pulses palpable 2 out of 4.  Digital hair not present x 10 digits.  Skin temperature gradient WNL b/l.  Dermatological Examination: Skin with normal turgor, texture and tone b/l  Toenails 1-5 b/l discolored, thick, dystrophic with subungual debris and pain with palpation to nailbeds due to thickness of nails.  Musculoskeletal: Muscle strength 5/5 to all LE muscle groups.  Neurological: Sensation not intact with 10 gram monofilament.  Vibratory sensation diminished  Assessment: 1. NIDDM 2. Encounter for diabetic foot examination  Plan: 1. Discussed diabetic foot care principles. Literature dispensed on today. 2. Patient to continue soft, supportive shoe gear daily. 3. Patient to report any pedal injuries to medical professional immediately. 4. Follow up one year. 5. Patient/POA to call should there be a concern in the interim.

## 2020-04-04 ENCOUNTER — Telehealth: Payer: Self-pay

## 2020-04-04 NOTE — Telephone Encounter (Signed)
RTC to wife [facility]), she is asking which medication patient was told to stop taking at his LOV.  Wife informed that per Dr. Joretta Bachelor office visit notes from 03/11/20, patient is to discontinue Januvia 100mg  daily, she verbalized understanding.    RN also informed wife per MD's office visit notes, patient was to f/u in 1 week, no appt's noted since LOV.  Appt offered, wife declined and states she will call back after checking their calendar. SChaplin, RN,BSN

## 2020-04-04 NOTE — Telephone Encounter (Signed)
Pls contact pt is wanting to know which medicine did the physician take him off pls contact [phone]

## 2020-04-04 NOTE — Telephone Encounter (Signed)
Agree with assessment, plan and recommendations. Thank you.

## 2020-04-14 ENCOUNTER — Telehealth: Payer: Self-pay | Admitting: *Deleted

## 2020-04-14 NOTE — Telephone Encounter (Signed)
Informed patient of sleep study results and patient understanding was verbalized. Patient understands his sleep study showed they had a successful PAP titration and let DME know that orders are in EPIC. Please set up 8 week OV with me.    Upon patient request DME selection is [facility]. Patient understands she/he will be contacted by [facility] to set up her/he cpap. Patient understands to call if [facility] does not contact her/he with new setup in a timely manner. Patient understands they will be called once confirmation has been received from [facility] that they have received their new machine to schedule 10 week follow up appointment.   [facility] notified of new cpap order  Please add to airview Patient was grateful for the call and thanked me.

## 2020-04-14 NOTE — Telephone Encounter (Signed)
-----   Message from Quintella Reichert, MD sent at 03/30/2020  3:04 PM EST ----- Please let patient know that they had a successful PAP titration and let DME know that orders are in EPIC.  Please set up 8 week OV with me.

## 2020-04-17 ENCOUNTER — Encounter (HOSPITAL_COMMUNITY): Payer: Medicaid Other

## 2020-04-17 NOTE — Telephone Encounter (Signed)
Patient called back and we went over his results again. Pt is agreeable to treatment.

## 2020-04-17 NOTE — Telephone Encounter (Signed)
Pt wife called in and told her that he did not get the results of his sleep study. I explain to her that pt was called on 2/28.  She stated that he must have not understood then. And asked if we could call him again with those results   Best number [phone]

## 2020-04-24 ENCOUNTER — Telehealth (HOSPITAL_COMMUNITY): Payer: Self-pay | Admitting: *Deleted

## 2020-04-24 DIAGNOSIS — Z0000 Encounter for general adult medical examination without abnormal findings: Secondary | ICD-10-CM

## 2020-04-24 NOTE — Telephone Encounter (Signed)
Tried to connect with patient to see if interested in health coaching. Was not able to connect with patient, however, did leave a message. Will call patient back on 3/17. Lamar Blinks., MPH Candidate [facility] Health Coach Intern

## 2020-04-24 NOTE — Telephone Encounter (Signed)
Patient was seen by MPH Candidate for Health Coaching.   Health Coach Preceptor agrees with assessment and plan.   Lesia Hausen, MS, ACSM-CEP, [facility]  04/24/2020 2:20 PM

## 2020-05-01 ENCOUNTER — Encounter (HOSPITAL_COMMUNITY): Payer: Self-pay

## 2020-05-01 ENCOUNTER — Other Ambulatory Visit: Payer: Self-pay

## 2020-05-01 ENCOUNTER — Ambulatory Visit (HOSPITAL_COMMUNITY)
Admission: RE | Admit: 2020-05-01 | Discharge: 2020-05-01 | Disposition: A | Discharge status: Home / Self Care | Payer: Medicaid Other | Source: Ambulatory Visit | Attending: Cardiology | Admitting: Cardiology

## 2020-05-01 VITALS — BP 110/70 | HR 102 | Wt 396.4 lb

## 2020-05-01 DIAGNOSIS — Z596 Low income: Secondary | ICD-10-CM | POA: Diagnosis not present

## 2020-05-01 DIAGNOSIS — I5022 Chronic systolic (congestive) heart failure: Secondary | ICD-10-CM

## 2020-05-01 DIAGNOSIS — J45909 Unspecified asthma, uncomplicated: Secondary | ICD-10-CM | POA: Diagnosis not present

## 2020-05-01 DIAGNOSIS — Z9114 Patient's other noncompliance with medication regimen: Secondary | ICD-10-CM | POA: Diagnosis not present

## 2020-05-01 DIAGNOSIS — I4510 Unspecified right bundle-branch block: Secondary | ICD-10-CM | POA: Diagnosis not present

## 2020-05-01 DIAGNOSIS — Z885 Allergy status to narcotic agent status: Secondary | ICD-10-CM | POA: Diagnosis not present

## 2020-05-01 DIAGNOSIS — G4733 Obstructive sleep apnea (adult) (pediatric): Secondary | ICD-10-CM | POA: Diagnosis not present

## 2020-05-01 DIAGNOSIS — Z79899 Other long term (current) drug therapy: Secondary | ICD-10-CM | POA: Insufficient documentation

## 2020-05-01 DIAGNOSIS — E11610 Type 2 diabetes mellitus with diabetic neuropathic arthropathy: Secondary | ICD-10-CM | POA: Diagnosis not present

## 2020-05-01 DIAGNOSIS — I110 Hypertensive heart disease with heart failure: Secondary | ICD-10-CM | POA: Diagnosis not present

## 2020-05-01 DIAGNOSIS — E1165 Type 2 diabetes mellitus with hyperglycemia: Secondary | ICD-10-CM | POA: Insufficient documentation

## 2020-05-01 DIAGNOSIS — Z7984 Long term (current) use of oral hypoglycemic drugs: Secondary | ICD-10-CM | POA: Insufficient documentation

## 2020-05-01 DIAGNOSIS — Z8249 Family history of ischemic heart disease and other diseases of the circulatory system: Secondary | ICD-10-CM | POA: Diagnosis not present

## 2020-05-01 DIAGNOSIS — Z9119 Patient's noncompliance with other medical treatment and regimen: Secondary | ICD-10-CM | POA: Diagnosis not present

## 2020-05-01 DIAGNOSIS — Z6841 Body Mass Index (BMI) 40.0 and over, adult: Secondary | ICD-10-CM | POA: Diagnosis not present

## 2020-05-01 DIAGNOSIS — I428 Other cardiomyopathies: Secondary | ICD-10-CM | POA: Insufficient documentation

## 2020-05-01 LAB — BRAIN NATRIURETIC PEPTIDE: B Natriuretic Peptide: 862.1 pg/mL — ABNORMAL HIGH (ref 0.0–100.0)

## 2020-05-01 LAB — BASIC METABOLIC PANEL
Anion gap: 11 (ref 5–15)
BUN: 13 mg/dL (ref 6–20)
CO2: 30 mmol/L (ref 22–32)
Calcium: 9.7 mg/dL (ref 8.9–10.3)
Chloride: 92 mmol/L — ABNORMAL LOW (ref 98–111)
Creatinine, Ser: 0.97 mg/dL (ref 0.61–1.24)
GFR, Estimated: >60 mL/min (ref >60)
Glucose, Bld: 369 mg/dL — ABNORMAL HIGH (ref 70–99)
Potassium: 4.2 mmol/L (ref 3.5–5.1)
Sodium: 133 mmol/L — ABNORMAL LOW (ref 135–145)

## 2020-05-01 LAB — DIGOXIN LEVEL: Digoxin Level: 0.5 ng/mL — ABNORMAL LOW (ref 0.8–2.0)

## 2020-05-01 MED ORDER — TORSEMIDE 20 MG PO TABS
60.0000 mg | ORAL_TABLET | Freq: Every day | ORAL | 3 refills | Status: DC
Start: 2020-05-01 — End: 2020-11-12

## 2020-05-01 NOTE — Progress Notes (Signed)
Advanced Heart Failure Clinic Note   PCP: [name], MD HF Cardiologist: Dr. Haroldine Laws   HPI:  Jonathan [name] is a 35 y.o. male with history of systolic HF due to NICM, HTN, DM 2, Asthma, OSA, and morbid obesity.  Admitted 9/18 with progressive SOB x 2 weeks. He reports h/o systolic CHF with EF 77-41% by Echo in [place] in 2017. Cath around that time without any significant CAD per patient. He was diuresed with IV Lasix, discharge weight was 373 pounds. He had run out of most of his medications due to moving to [place] and not having insurance. Meds restarted at discharge: Coreg, Mearl Latin, Lasix.   He had repeat Echo 03/02/2017. EF had normalized to 55%.   He has a history of poor compliance.  He was previously being followed by paramedicine program.  He was lost to follow-up and not seen in the advanced heart failure clinic since 2019.  Also previously followed by Dr. Radford Pax for sleep apnea but also lost to follow-up.  Non compliant with CPAP.  Reports he cannot tolerate current facemask.  He has recently been followed by [facility].  He was evaluated 2/21 after he developed progressive lower extremity edema and dyspnea on exertion after running out of his diuretic for approximately 2 months. NYHA IIIB. He was restarted on torsemide and continued on Entresto, carvedilol, spironolactone and Jardiance and referred back to the [facility].   Had echo 3/21, EF 30-35%. RV moderately reduced.   Repeat Echo 10/21  EF 30-35% with moderate RV dysfunction in setting of recurrent RBBB with significant dyssynchrony  01/17/20 follow up for weight gain of 28 lbs and SOB due to dietary indiscretion and diuretic noncompliance. Instructed to take metolazone 2.5 mg x 3 days + extra 40 KCl and set up with paramedicine.   Had repeat sleep study 1/22 c/w OSA. Dr. Radford Pax following. He reports he is waiting on a new CPAP.   Last clinic visit 1/22, BP was elevated and coreg was increased to 25 mg bid.   He  returns today for f/u. BP well controlled at 110/70 but he is mildly fluid overloaded. Wt up 11 lb since last visit, in the setting of poor compliance w/ torsemide and dietary indiscretion w/ sodium. His Birthday was last week and he reports he "over did it" with his diet. Has noticed increased exertional dyspnea over the last week. No resting dyspnea. He has not been taking torsemide as prescribed. Missed several doses last week and he is not taking recommended 60 mg daily. We he does take it, he alternates 60 mg one day and 40 the next. We also discussed ICD but he declines.    Echo 10/21: EF 30-35% with moderate RV dysfunction in setting of recurrent RBBB with significant dyssynchrony. Reviewed by Dr Haroldine Laws.  ECHO 04/2019: EF 30-35%  ECHO 2019: EF 55%    Past Medical History:  Diagnosis Date  . Acute pain of left foot 05/03/2017  . Asthma   . Asthma, chronic, mild persistent, uncomplicated 2/87/8676  . Charcot ankle, left 05/31/2017  . CHF (congestive heart failure) ([place])   . Chronic systolic (congestive) heart failure ([place])   . Diabetes mellitus type 2 in obese ([place]) 04/20/2016  . Diabetic polyneuropathy associated with type 2 diabetes mellitus ([place]) 03/14/2017  . Essential hypertension 10/26/2016  . Hypertension   . Obesity   . Obesity, Class III, BMI 40-49.9 (morbid obesity) ([place]) 10/26/2016  . OSA (obstructive sleep apnea) 12/02/2016  . Type 2  diabetes mellitus with diabetic neuropathy ([place])     Current Outpatient Medications  Medication Sig Dispense Refill  . blood glucose meter kit and supplies KIT Dispense based on patient and insurance preference. Use up to four times daily as directed. (FOR ICD-9 250.00, 250.01). For QAC - HS accuchecks. May switch to any brand. 1 each 1  . carvedilol (COREG) 25 MG tablet Take 1 tablet (25 mg total) by mouth 2 (two) times daily. 60 tablet 6  . digoxin (LANOXIN) 0.125 MG tablet Take 1 tablet (0.125 mg total) by mouth daily. 90 tablet 3  .  empagliflozin (JARDIANCE) 25 MG TABS tablet Take 25 mg by mouth daily. 90 tablet 1  . gabapentin (NEURONTIN) 300 MG capsule Take 300 mg by mouth as needed.    Marland Kitchen KLOR-CON M20 20 MEQ tablet TAKE 2 TABLETS BY MOUTH AS NEEDED. WHEN YOU TAKE METOLAZONE 10 tablet 3  . metolazone (ZAROXOLYN) 2.5 MG tablet Take 1 tablet (2.5 mg total) by mouth daily as needed (1-2 times per month). As directed by HF clinic for weight gain of 3 lbs overnight or 5 lbs within one week. 10 tablet 2  . sacubitril-valsartan (ENTRESTO) 97-103 MG Take 1 tablet by mouth 2 (two) times daily. 60 tablet 6  . Semaglutide (RYBELSUS) 7 MG TABS Take 7 mg by mouth daily. 30 tablet 1  . spironolactone (ALDACTONE) 25 MG tablet Take 1 tablet (25 mg total) by mouth daily. 90 tablet 3  . torsemide (DEMADEX) 20 MG tablet TAKE 3 TABLETS BY MOUTH EVERY DAY 270 tablet 1   No current facility-administered medications for this encounter.    Allergies  Allergen Reactions  . Hydrocodone     Hives       Social History   Socioeconomic History  . Marital status: Married    Spouse name: Not on file  . Number of children: Not on file  . Years of education: Not on file  . Highest education level: Not on file  Occupational History  . Not on file  Tobacco Use  . Smoking status: Never Smoker  . Smokeless tobacco: Never Used  Vaping Use  . Vaping Use: Never used  Substance and Sexual Activity  . Alcohol use: No  . Drug use: No  . Sexual activity: Never  Other Topics Concern  . Not on file  Social History Narrative  . Not on file   Social Determinants of Health   Financial Resource Strain: Medium Risk  . Difficulty of Paying Living Expenses: Somewhat hard  Food Insecurity: No Food Insecurity  . Worried About Charity fundraiser in the Last Year: Never true  . Ran Out of Food in the Last Year: Never true  Transportation Needs: No Transportation Needs  . Lack of Transportation (Medical): No  . Lack of Transportation (Non-Medical):  No  Physical Activity: Not on file  Stress: Not on file  Social Connections: Not on file  Intimate Partner Violence: Not on file      Family History  Problem Relation Age of Onset  . Diabetes Mellitus II Mother   . Heart failure Mother   . Hypertension Mother   . Heart disease Mother   . Heart failure Father   . Kidney disease Father   . Heart disease Father   . Congestive Heart Failure Neg Hx     Vitals:   05/01/20 0914  BP: 110/70  Pulse: (!) 102  SpO2: 91%  Weight: (!) 179.8 kg (396 lb 6.4 oz)  Wt Readings from Last 3 Encounters:  05/01/20 (!) 179.8 kg (396 lb 6.4 oz)  03/26/20 (!) 175.1 kg (386 lb)  03/12/20 (!) 177.4 kg (391 lb)    PHYSICAL EXAM: General:  Well appearing, obese. No respiratory difficulty HEENT: normal Neck: supple. Thick neck  JVD not well visualized . Carotids 2+ bilat; no bruits. No lymphadenopathy or thyromegaly appreciated. Cor: PMI nondisplaced. Regular rate & rhythm. No rubs, gallops or murmurs. Lungs: Bilateral expiratory rhonchi, no crackles or wheezing  Abdomen: obese, soft, nontender, nondistended. No hepatosplenomegaly. No bruits or masses. Good bowel sounds. Extremities: no cyanosis, clubbing, rash, 2+ bilateral pretibial edema  Neuro: alert & oriented x 3, cranial nerves grossly intact. moves all 4 extremities w/o difficulty. Affect pleasant.   EKG: EKG shows sinus tach 102 bpm   ASSESSMENT & PLAN:  1. Chronic systolic CHF due to NICM:  - normal cors by cath in [place] in 2017.  - Echo 10/2016 EF 30-35%.  - Echo 02/2017 LVEF 55% with complete recovery. - ECHO 04/2019 EF back down to 30-35%  In setting of stopping meds - Echo 12/03/19 EF 30-35% with moderate RV dysfunction in setting of recurrent RBBB with significant dyssynchrony.  - NYHA Class III. Fluid overloaded in the setting of dietary indiscretion w/ sodium + poor compliance w/ torsemide - long discussion held regarding importance of strict adherence to med regimen + low  sodium diet to prevent worsening HF symptoms and reduce risk for hospitalization. - Take 2.5 mg of metolazone x 1 w/ today's Am dose of torsemide.    - Increase Torsemide to 60 mg daily (currently alternating days, 60/40) - Continue Entresto to 97/103 mg BID.  - Continue Coreg 25 mg bid  - Continue spiro 25 mg daily.  - Continue Digoxin. Check dig level.  - Continue Jardiance 10 mg daily.  - EKG shows sinus tach 102, suspect 2/2 fluid overload. Will reassess at next f/u visit after diuretics are adjusted. If HR still elevated, will add corlanor.  - Check BMP today and again in 7 days  - Given chronically low LVEF despite optimal medical therapy + young age, I recommended referral to EP for ICD evaluation (? CRT-D given dyssynchrony noted on recent echo), however pt adamantly declines ICD. He understands risk of SCD.    2. HTN - controlled on current regimen  - check BMP today   3. Uncontrolled. DM2  - Management per PCP. - most recent hgb a1c 10.4  on 04/09/19.  - Continue Jardiance.   4. Morbid Obesity -Body mass index is 50.89 kg/m.  -Discussed low salt food choices.  - Will refer to health coach   5. OSA/OHS - Severe OSA AHI 65.2/hr. - Bipap titration recommended (awaiting new device)  - Followed by Dr. Radford Pax.  6. Charcot's foot - Left - No wounds.   7. H/o Noncompliance - Previously followed by Paramedicine but discharged due to poor communication/ inability to arrange home visits.  - I discussed w/ him the importance of remaining compliant w/ medications + daily wts    F/u in 3-4 weeks to reassess fluid status. He was instructed to check wt daily and will notify us if his wt starts to trend up.   Lyda Jester, PA-C 05/01/20

## 2020-05-01 NOTE — Patient Instructions (Signed)
INCREASE Torsemide to 60 mg, (3 tabs) daily TAKE Metolazone 2.5 mg, one tab 05/01/20 TAKE additional of potassium with metolazone dose  Labs today We will only contact you if something comes back abnormal or we need to make some changes. Otherwise no news is good news!  Labs needed in one week  Your physician recommends that you schedule a follow-up appointment in: 4 weeks  in the Advanced Practitioners (PA/NP) Clinic    At the Advanced Heart Failure Clinic, you and your health needs are our priority. As part of our continuing mission to provide you with exceptional heart care, we have created designated Provider Care Teams. These Care Teams include your primary Cardiologist (physician) and Advanced Practice Providers (APPs- Physician Assistants and Nurse Practitioners) who all work together to provide you with the care you need, when you need it.   You may see any of the following providers on your designated Care Team at your next follow up: Marland Kitchen Dr Arvilla Meres . Dr Marca Ancona . Dr Thornell Mule . Tonye Becket, NP . Robbie Lis, PA . Shanda Bumps [place],NP . Karle Plumber, PharmD   Please be sure to bring in all your medications bottles to every appointment.   Do the following things EVERYDAY: 1) Weigh yourself in the morning before breakfast. Write it down and keep it in a log. 2) Take your medicines as prescribed 3) Eat low salt foods--Limit salt (sodium) to 2000 mg per day.  4) Stay as active as you can everyday 5) Limit all fluids for the day to less than 2 liters If you have any questions or concerns before your next appointment please send [place] a message through [place] or call our office at [phone].    TO LEAVE A MESSAGE FOR THE NURSE SELECT OPTION 2, PLEASE LEAVE A MESSAGE INCLUDING: . YOUR NAME . DATE OF BIRTH . CALL BACK NUMBER . REASON FOR CALL**this is important as we prioritize the call backs  YOU WILL RECEIVE A CALL BACK THE SAME DAY AS LONG AS YOU  CALL BEFORE 4:00 PM Please see our updated No Show and Same Day Appointment Cancellation Policy attached to your AVS.

## 2020-05-06 ENCOUNTER — Encounter (HOSPITAL_COMMUNITY): Payer: Self-pay | Admitting: *Deleted

## 2020-05-06 NOTE — Progress Notes (Signed)
Patient referred to Health Coaching via Care Navigation CSW for guidance and discussion about health coaching to improve and work toward health goals. Patient and wife were provided with an overview of program offering. Patient did not express any interest in participating. All patient's questions were answered and patient was given contact information for further questions of concerns regarding coaching to achieve their health goals.  Lamar Blinks., MPH Candidate H&V Health Coach Intern

## 2020-05-06 NOTE — Progress Notes (Signed)
Patient was seen by MPH Candidate for Health Coaching.   Health Coach Preceptor agrees with assessment and plan.   Lesia Hausen, MS, ACSM-CEP, NBC-HWC  05/06/2020 9:44 AM

## 2020-05-13 ENCOUNTER — Other Ambulatory Visit: Payer: Self-pay | Admitting: Internal Medicine

## 2020-05-23 ENCOUNTER — Encounter (HOSPITAL_COMMUNITY): Payer: Self-pay

## 2020-05-26 ENCOUNTER — Encounter (HOSPITAL_COMMUNITY): Payer: Self-pay

## 2020-05-26 ENCOUNTER — Other Ambulatory Visit (HOSPITAL_COMMUNITY): Payer: Self-pay | Admitting: *Deleted

## 2020-05-29 ENCOUNTER — Other Ambulatory Visit (HOSPITAL_COMMUNITY): Payer: Self-pay | Admitting: Internal Medicine

## 2020-05-29 ENCOUNTER — Encounter (HOSPITAL_COMMUNITY): Payer: Self-pay

## 2020-05-29 ENCOUNTER — Other Ambulatory Visit: Payer: Self-pay

## 2020-05-29 ENCOUNTER — Ambulatory Visit (HOSPITAL_COMMUNITY)
Admission: RE | Admit: 2020-05-29 | Discharge: 2020-05-29 | Disposition: A | Discharge status: Home / Self Care | Payer: Medicaid Other | Source: Ambulatory Visit | Attending: Cardiology | Admitting: Cardiology

## 2020-05-29 ENCOUNTER — Other Ambulatory Visit: Payer: Self-pay | Admitting: Internal Medicine

## 2020-05-29 VITALS — BP 110/88 | HR 102 | Wt 385.6 lb

## 2020-05-29 DIAGNOSIS — I4510 Unspecified right bundle-branch block: Secondary | ICD-10-CM | POA: Diagnosis not present

## 2020-05-29 DIAGNOSIS — Z713 Dietary counseling and surveillance: Secondary | ICD-10-CM | POA: Insufficient documentation

## 2020-05-29 DIAGNOSIS — K029 Dental caries, unspecified: Secondary | ICD-10-CM | POA: Insufficient documentation

## 2020-05-29 DIAGNOSIS — I5022 Chronic systolic (congestive) heart failure: Secondary | ICD-10-CM | POA: Insufficient documentation

## 2020-05-29 DIAGNOSIS — I110 Hypertensive heart disease with heart failure: Secondary | ICD-10-CM | POA: Diagnosis not present

## 2020-05-29 DIAGNOSIS — Z596 Low income: Secondary | ICD-10-CM | POA: Insufficient documentation

## 2020-05-29 DIAGNOSIS — Z6841 Body Mass Index (BMI) 40.0 and over, adult: Secondary | ICD-10-CM | POA: Diagnosis not present

## 2020-05-29 DIAGNOSIS — Z9114 Patient's other noncompliance with medication regimen: Secondary | ICD-10-CM | POA: Insufficient documentation

## 2020-05-29 DIAGNOSIS — Z7984 Long term (current) use of oral hypoglycemic drugs: Secondary | ICD-10-CM | POA: Diagnosis not present

## 2020-05-29 DIAGNOSIS — Z79899 Other long term (current) drug therapy: Secondary | ICD-10-CM | POA: Insufficient documentation

## 2020-05-29 DIAGNOSIS — E11610 Type 2 diabetes mellitus with diabetic neuropathic arthropathy: Secondary | ICD-10-CM | POA: Insufficient documentation

## 2020-05-29 DIAGNOSIS — G4733 Obstructive sleep apnea (adult) (pediatric): Secondary | ICD-10-CM | POA: Diagnosis not present

## 2020-05-29 DIAGNOSIS — J45909 Unspecified asthma, uncomplicated: Secondary | ICD-10-CM | POA: Insufficient documentation

## 2020-05-29 LAB — BASIC METABOLIC PANEL
Anion gap: 13 (ref 5–15)
BUN: 11 mg/dL (ref 6–20)
CO2: 26 mmol/L (ref 22–32)
Calcium: 9.3 mg/dL (ref 8.9–10.3)
Chloride: 92 mmol/L — ABNORMAL LOW (ref 98–111)
Creatinine, Ser: 0.93 mg/dL (ref 0.61–1.24)
GFR, Estimated: >60 mL/min (ref >60)
Glucose, Bld: 433 mg/dL — ABNORMAL HIGH (ref 70–99)
Potassium: 4.2 mmol/L (ref 3.5–5.1)
Sodium: 131 mmol/L — ABNORMAL LOW (ref 135–145)

## 2020-05-29 LAB — DIGOXIN LEVEL: Digoxin Level: 0.2 ng/mL — ABNORMAL LOW (ref 0.8–2.0)

## 2020-05-29 NOTE — Patient Instructions (Signed)
It was great to see you today! No medication changes are needed at this time.  Labs today We will only contact you if something comes back abnormal or we need to make some changes. Otherwise no news is good news!  Your physician recommends that you schedule a follow-up appointment in: 3 months with Dr Gala Romney  Do the following things EVERYDAY: 1) Weigh yourself in the morning before breakfast. Write it down and keep it in a log. 2) Take your medicines as prescribed 3) Eat low salt foods--Limit salt (sodium) to 2000 mg per day.  4) Stay as active as you can everyday 5) Limit all fluids for the day to less than 2 liters At the Advanced Heart Failure Clinic, you and your health needs are our priority. As part of our continuing mission to provide you with exceptional heart care, we have created designated Provider Care Teams. These Care Teams include your primary Cardiologist (physician) and Advanced Practice Providers (APPs- Physician Assistants and Nurse Practitioners) who all work together to provide you with the care you need, when you need it.   You may see any of the following providers on your designated Care Team at your next follow up: Marland Kitchen Dr Arvilla Meres . Dr Marca Ancona . Dr Thornell Mule . Tonye Becket, NP . Robbie Lis, PA . Shanda Bumps [place],NP . Karle Plumber, PharmD   Please be sure to bring in all your medications bottles to every appointment.

## 2020-05-29 NOTE — Progress Notes (Signed)
Advanced Heart Failure Clinic Note   PCP: Jonathan Mercury, MD HF Cardiologist: Dr. Haroldine [name]   HPI:  Jonathan [name] is a 35 y.o. male with history of systolic HF due to NICM, HTN, DM 2, Asthma, OSA, and morbid obesity.  Admitted 9/18 with progressive SOB x 2 weeks. He reports h/o systolic CHF with EF 35-57% by Echo in [place] in 2017. Cath around that time without any significant CAD per patient. He was diuresed with IV Lasix, discharge weight was 373 pounds. He had run out of most of his medications due to moving to [place] and not having insurance. Meds restarted at discharge: Coreg, Mearl Latin, Lasix.   He had repeat Echo 03/02/2017. EF had normalized to 55%.   He has a history of poor compliance.  He was previously being followed by paramedicine program.  He was lost to follow-up and not seen in the advanced heart failure clinic since 2019.  Also previously followed by Dr. Radford Pax for sleep apnea but also lost to follow-up.  Non compliant with CPAP.  Reports he cannot tolerate current facemask.  He has recently been followed by [facility].  He was evaluated 2/21 after he developed progressive lower extremity edema and dyspnea on exertion after running out of his diuretic for approximately 2 months. NYHA IIIB. He was restarted on torsemide and continued on Entresto, carvedilol, spironolactone and Jardiance and referred back to the [facility].   Had echo 3/21, EF 30-35%. RV moderately reduced.   Repeat Echo 10/21  EF 30-35% with moderate RV dysfunction in setting of recurrent RBBB with significant dyssynchrony  01/17/20 follow up for weight gain of 28 lbs and SOB due to dietary indiscretion and diuretic noncompliance. Instructed to take metolazone 2.5 mg x 3 days + extra 40 KCl and set up with paramedicine.   Had repeat sleep study 1/22 c/w OSA. Dr. Radford Pax following. He reports he is waiting on a new CPAP.   Had clinic visit 1/22, BP was elevated and coreg was increased to 25 mg bid.   Most  recent f/u was 3/21. BP was well controlled at 110/70 but he was  fluid overloaded. Wt was up 11 lb in the setting of poor compliance w/ torsemide and dietary indiscretion w/ sodium. Had endorsed increased exertional dyspnea but no resting dyspnea. He was not taking torsemide as prescribed. Had missed several doses the week prior and not taking recommended 60 mg daily. He was alternating 60 mg one day and 40 the next. We had also discussed ICD but he adamantly declined. He was advised to take 2.5 mg of metolazone x 1 w/ his Am dose of torsemide and to increase torsemide dose to 60 mg, as previously instructed.   He presents back to clinic today for f/u. His wt is down 11 lb, from 396>>385 lb. BP well controlled at 110/88. Feels tired, NYHA Class III chronic, confounded by obesity and deconditioning. HR elevated 102 bpm, chronically elevated, despite being on high dose Coreg + digoxin. I recommended addition of Corlanor but he declines adding another medicine.   Despite fluid status being better, he still has been scaling back on his torsemide from time to time, only taking 40 mg on some days to reduce need for frequent urination. He has trace-1+ pretibial edema on exam. Has not yet taken am dose of torsemide today.   He is also asking about clearance to have 2 teeth extracted.     Echo 10/21: EF 30-35% with moderate RV dysfunction in setting  of recurrent RBBB with significant dyssynchrony. Reviewed by Dr Jonathan [name].  ECHO 04/2019: EF 30-35%  ECHO 2019: EF 55%     Past Medical History:  Diagnosis Date  . Acute pain of left foot 05/03/2017  . Asthma   . Asthma, chronic, mild persistent, uncomplicated 0/31/5945  . Charcot ankle, left 05/31/2017  . CHF (congestive heart failure) ([place])   . Chronic systolic (congestive) heart failure ([place])   . Diabetes mellitus type 2 in obese ([place]) 04/20/2016  . Diabetic polyneuropathy associated with type 2 diabetes mellitus ([place]) 03/14/2017  . Essential hypertension  10/26/2016  . Hypertension   . Obesity   . Obesity, Class III, BMI 40-49.9 (morbid obesity) ([place]) 10/26/2016  . OSA (obstructive sleep apnea) 12/02/2016  . Type 2 diabetes mellitus with diabetic neuropathy ([facility])     Current Outpatient Medications  Medication Sig Dispense Refill  . blood glucose meter kit and supplies KIT Dispense based on patient and insurance preference. Use up to four times daily as directed. (FOR ICD-9 250.00, 250.01). For QAC - HS accuchecks. May switch to any brand. 1 each 1  . carvedilol (COREG) 25 MG tablet Take 1 tablet (25 mg total) by mouth 2 (two) times daily. 60 tablet 6  . digoxin (LANOXIN) 0.125 MG tablet Take 1 tablet (0.125 mg total) by mouth daily. 90 tablet 3  . empagliflozin (JARDIANCE) 25 MG TABS tablet Take 25 mg by mouth daily. 90 tablet 1  . gabapentin (NEURONTIN) 300 MG capsule Take 300 mg by mouth as needed.    Marland Kitchen KLOR-CON M20 20 MEQ tablet TAKE 2 TABLETS BY MOUTH AS NEEDED. WHEN YOU TAKE METOLAZONE 10 tablet 3  . metolazone (ZAROXOLYN) 2.5 MG tablet Take 1 tablet (2.5 mg total) by mouth daily as needed (1-2 times per month). As directed by HF clinic for weight gain of 3 lbs overnight or 5 lbs within one week. 10 tablet 2  . sacubitril-valsartan (ENTRESTO) 97-103 MG Take 1 tablet by mouth 2 (two) times daily. 60 tablet 6  . Semaglutide (RYBELSUS) 7 MG TABS Take 7 mg by mouth daily. 30 tablet 1  . spironolactone (ALDACTONE) 25 MG tablet Take 1 tablet (25 mg total) by mouth daily. 90 tablet 3  . torsemide (DEMADEX) 20 MG tablet Take 3 tablets (60 mg total) by mouth daily. 90 tablet 3   No current facility-administered medications for this encounter.    Allergies  Allergen Reactions  . Hydrocodone     Hives       Social History   Socioeconomic History  . Marital status: Married    Spouse name: Not on file  . Number of children: Not on file  . Years of education: Not on file  . Highest education level: Not on file  Occupational History  .  Not on file  Tobacco Use  . Smoking status: Never Smoker  . Smokeless tobacco: Never Used  Vaping Use  . Vaping Use: Never used  Substance and Sexual Activity  . Alcohol use: No  . Drug use: No  . Sexual activity: Never  Other Topics Concern  . Not on file  Social History Narrative  . Not on file   Social Determinants of Health   Financial Resource Strain: Medium Risk  . Difficulty of Paying Living Expenses: Somewhat hard  Food Insecurity: No Food Insecurity  . Worried About Charity fundraiser in the Last Year: Never true  . Ran Out of Food in the Last Year: Never true  Transportation  Needs: No Transportation Needs  . Lack of Transportation (Medical): No  . Lack of Transportation (Non-Medical): No  Physical Activity: Not on file  Stress: Not on file  Social Connections: Not on file  Intimate Partner Violence: Not on file      Family History  Problem Relation Age of Onset  . Diabetes Mellitus II Mother   . Heart failure Mother   . Hypertension Mother   . Heart disease Mother   . Heart failure Father   . Kidney disease Father   . Heart disease Father   . Congestive Heart Failure Neg Hx     Vitals:   05/29/20 1011  BP: 110/88  Pulse: (!) 102  SpO2: 94%  Weight: (!) 174.9 kg (385 lb 9.6 oz)    Wt Readings from Last 3 Encounters:  05/29/20 (!) 174.9 kg (385 lb 9.6 oz)  05/01/20 (!) 179.8 kg (396 lb 6.4 oz)  03/26/20 (!) 175.1 kg (386 lb)    PHYSICAL EXAM: General:  Well appearing, obese, young [name]. No respiratory difficulty HEENT: normal Neck: supple. no JVD. Carotids 2+ bilat; no bruits. No lymphadenopathy or thyromegaly appreciated. Cor: PMI nondisplaced. Regular rhythm, mildly tachy rate. No rubs, gallops or murmurs. Lungs: clear Abdomen: soft, nontender, nondistended. No hepatosplenomegaly. No bruits or masses. Good bowel sounds. Extremities: no cyanosis, clubbing, rash, 1+ bilateral LE edema Neuro: alert & oriented x 3, cranial nerves grossly intact.  moves all 4 extremities w/o difficulty. Affect pleasant.    ASSESSMENT & PLAN:  1. Chronic systolic CHF due to NICM:  - normal cors by cath in [place] in 2017.  - Echo 10/2016 EF 30-35%.  - Echo 02/2017 LVEF 55% with complete recovery. - ECHO 04/2019 EF back down to 30-35%  In setting of stopping meds - Echo 12/03/19 EF 30-35% with moderate RV dysfunction in setting of recurrent RBBB with significant dyssynchrony.  - NYHA Class III, chronic and confounded by supermorbid obesity and deconditioning - Continue daily torsemide, recommend 60 mg daily. Encouraged strict compliance and daily wts.  - Continue Entresto  97/103 mg BID.  - Continue Coreg 25 mg bid   - Continue spiro 25 mg daily.  - Continue Digoxin 0.125. Check dig level.  - Continue Jardiance 10 mg daily. Denies GU symptoms  - recommend addition of Corlanor for persistent sinus tach but pt refused new medicine  - Check BMP today  - Given chronically low LVEF despite optimal medical therapy + young age, I recommended referral to EP for ICD evaluation (? CRT-D given dyssynchrony noted on recent echo), however pt adamantly declines ICD. He understands risk of SCD.    2. HTN - well controlled on current regimen  - check BMP today   3. Uncontrolled DM2  - Management per PCP. - most recent hgb a1c 11.6 - Continue Jardiance. Denies GU symptoms   4. Morbid Obesity -Body mass index is 49.51 kg/m.  -Discussed low salt food choices.  - We previously referred him to our health coach but he refused their services    5. OSA/OHS - Severe OSA AHI 65.2/hr. - Bipap titration recommended (awaiting new device)  - Followed by Dr. Radford Pax.  6. Charcot's foot - Left - No wounds.   7. H/o Noncompliance - Previously followed by Paramedicine but discharged due to poor communication/ inability to arrange home visits.  - I discussed w/ him the importance of remaining compliant w/ medications + daily wts to reduce worsening HF/  hospitalizations  8. Dental Carries -  needs 2 dental extractions, ok to have done. Clearance letter provided   F/u w/ Dr. Haroldine [name] in 2 months   Lyda Jester, PA-C 05/29/20

## 2020-06-11 ENCOUNTER — Other Ambulatory Visit: Payer: Self-pay

## 2020-06-11 MED ORDER — GABAPENTIN 300 MG PO CAPS
300.0000 mg | ORAL_CAPSULE | ORAL | 0 refills | Status: DC | PRN
Start: 2020-06-11 — End: 2020-07-17

## 2020-06-11 NOTE — Telephone Encounter (Signed)
Per chart review, Januvia was discontinued and there is a TC documented on 04/04/20 to [facility]) regarding the discontinuation of Januvia and that patient is to make a f/u appointment. No f/u appt's noted.  TC to EC, [facility] compliant message left that patient needs an appt and to call back to schedule.  Also reminded that Januvia was discontinued. SChaplin, RN,BSN

## 2020-06-11 NOTE — Telephone Encounter (Signed)
CMA was spoke with patient regarding gabapentin refill  Pt instructed to contact cardiology office for spironolactone refill.    appt given for Tues May 3rd to follow up diabetes/med refill  Pt agreed to appt, but still wanted gabapentin refill sent to MD for review.Jonathan Spittle Cassady4/27/20229:50 AM

## 2020-06-11 NOTE — Telephone Encounter (Signed)
Need refill on gabapentin (NEURONTIN) 300 MG capsule spironolactone (ALDACTONE) 25 MG tablet Januvia 100mg   ;pt contact [phone]   [id number] - [place], [place] - [address]

## 2020-06-11 NOTE — Telephone Encounter (Signed)
Patient has diabetic polyneuropathy and currently on gabapentin 300 mg as needed that was initially prescribed 6 months ago. Patient requesting refill for this medication. Sending refill to patient but patient will need further evaluation of his neuropathy at his next office visit with changes to his gabapentin prescription as needed.

## 2020-06-13 DIAGNOSIS — R600 Localized edema: Secondary | ICD-10-CM | POA: Diagnosis not present

## 2020-06-13 DIAGNOSIS — I5022 Chronic systolic (congestive) heart failure: Secondary | ICD-10-CM | POA: Diagnosis not present

## 2020-06-13 DIAGNOSIS — G4733 Obstructive sleep apnea (adult) (pediatric): Secondary | ICD-10-CM | POA: Diagnosis not present

## 2020-06-13 DIAGNOSIS — E11610 Type 2 diabetes mellitus with diabetic neuropathic arthropathy: Secondary | ICD-10-CM | POA: Diagnosis not present

## 2020-06-17 ENCOUNTER — Encounter: Payer: Self-pay | Admitting: Student

## 2020-06-17 ENCOUNTER — Ambulatory Visit (INDEPENDENT_AMBULATORY_CARE_PROVIDER_SITE_OTHER): Payer: Medicaid Other | Admitting: Student

## 2020-06-17 VITALS — BP 107/71 | HR 95 | Temp 97.8°F | Ht 74.0 in | Wt >= 6400 oz

## 2020-06-17 DIAGNOSIS — E1169 Type 2 diabetes mellitus with other specified complication: Secondary | ICD-10-CM

## 2020-06-17 DIAGNOSIS — Z77120 Contact with and (suspected) exposure to mold (toxic): Secondary | ICD-10-CM | POA: Diagnosis not present

## 2020-06-17 DIAGNOSIS — I5022 Chronic systolic (congestive) heart failure: Secondary | ICD-10-CM

## 2020-06-17 DIAGNOSIS — T7840XA Allergy, unspecified, initial encounter: Secondary | ICD-10-CM

## 2020-06-17 DIAGNOSIS — G4733 Obstructive sleep apnea (adult) (pediatric): Secondary | ICD-10-CM

## 2020-06-17 DIAGNOSIS — E669 Obesity, unspecified: Secondary | ICD-10-CM

## 2020-06-17 DIAGNOSIS — Z0000 Encounter for general adult medical examination without abnormal findings: Secondary | ICD-10-CM

## 2020-06-17 DIAGNOSIS — I10 Essential (primary) hypertension: Secondary | ICD-10-CM

## 2020-06-17 LAB — GLUCOSE, CAPILLARY: Glucose-Capillary: 307 mg/dL — ABNORMAL HIGH (ref 70–99)

## 2020-06-17 LAB — POCT GLYCOSYLATED HEMOGLOBIN (HGB A1C): HbA1c POC (<> result, manual entry): >14 % — AB (ref 4.0–5.6)

## 2020-06-17 MED ORDER — GLIMEPIRIDE 2 MG PO TABS: 2.0000 mg | ORAL_TABLET | Freq: Every day | ORAL | 1 refills | Status: DC

## 2020-06-17 MED ORDER — SITAGLIPTIN PHOSPHATE 100 MG PO TABS: 100.0000 mg | ORAL_TABLET | Freq: Every day | ORAL | 1 refills | Status: DC

## 2020-06-17 NOTE — Assessment & Plan Note (Signed)
Patient reporting mold exposure in apartment. States that black mold has been growing for a couple of months and that he has been experiencing intermittent headaches and mild breathing difficulties. He denies any symptoms at this time.  Placed referral to community care coordination for assistance with mold removal from home. Also placed referral to Allergist for skin allergy testing.

## 2020-06-17 NOTE — Assessment & Plan Note (Signed)
Patient with long history of uncontrolled T2DM, not currently taking any medications. Patient was initially diagnosed in 2018, at which time he was started on janumet 50-1000mg  bid. He had an allergic reaction to janumet (rash) and also developed neck pain to alogliptin/metformin. As a result, he has refused taking metformin since then. He was then started on glipizide and januvia to control his diabetes, but reportedly developed GI side effects to glipizide. He was then transitioned to [place]. Semaglutide was also added to help optimize blood glucose levels. Patient developed yeast infections and thus jardiance was discontinued. Januvia was discontinued during last visit (unsure as to exact reason why). Patient reports today that semaglutide has been causing persistent nausea and heartburn and he has stopped taking it. Prior, and today's, attempts to discuss transitioning to insulin were unsuccessful as he endorses having his joints "lock up" when injecting insulin.  Patients last HbA1c was 11.6% as of 03/11/2020. Today, HbA1c >14%.  We discussed at length the consequences of long term uncontrolled diabetes (one of which he experiences with polyneuropathy) and emphasized the importance of using diabetic medications for control of his blood glucose levels to prevent further worsening. Fortunately, last BMP shows that kidney function is still not impacted by uncontrolled diabetes. After extensive discussion with patient, will restart [place] 100mg  daily and glimepiride 2mg  daily as he still does not want to try metformin or insulin at this time. Patient has a CGM coming in soon, so discussed taking multiple daily readings throughout the day so that we can assess glycemic control, especially after addition of januvia and glimepiride.  Plan: -restart 100mg  daily and glimepiride 2mg  daily -lifestyle modifications (discussed diabetic diet and incorporating exercise to daily life) -f/u in 3  weeks to check CGM with addition of medications -repeat HbA1c in 3 months

## 2020-06-17 NOTE — Assessment & Plan Note (Signed)
Patient with hx of chronic systolic HF (ECHO 12/03/19 with EF 30-35% with mod RV dysfunction in setting of recurrent RBBB with significant dyssynchrony). Currently on coreg 25mg  BID, entresto 97-103mg  bid, aldactone 25mg  daily, digoxin 0.125mg  daily, torsemide 60mg  daily, and metolazone 2.5mg  daily prn. He follows with [facility].  States that he has not been taking his home torsemide for the past couple of weeks because the last time he took it three days in a row, he ended up in the hospital. He denies any worsening orthopnea. Discussed that his weight is 24lbs greater today than it was 3 weeks ago (385 > 409 lbs) and that he has a significant amount of lower extremity swelling ongoing at this time. Discussed at length about importance of adhering to HF medications to prevent exacerbations, especially given low EF at such a young age. He agrees to use medications as directed.  Plan: -continue current medications -f/u in 3 weeks to check for improvement in weight gain and LEE

## 2020-06-17 NOTE — Assessment & Plan Note (Signed)
Had sleep study performed in January 2022, was diagnosed with OSA and sent home with trial of auto BiPAP. Hopeful that this will help with patient's HTN and CHF.

## 2020-06-17 NOTE — Patient Instructions (Signed)
Mr Beg,  It was a pleasure seeing you in the clinic today.   1. Please take januvia and glimepiride as prescribed. It is very important to keep your sugars controlled.  2. I have placed referral to Allergy for a skin test.  Please call our clinic at [phone] if you have any questions or concerns. The best time to call is Monday-Friday from 9am-4pm, but there is someone available 24/7 at the same number. If you need medication refills, please notify your pharmacy one week in advance and they will send [place] a request.   Thank you for letting us take part in your care. We look forward to seeing you next time!

## 2020-06-17 NOTE — Progress Notes (Signed)
   CC: f/u of T2DM  HPI:  [name] is a 35 y.o. male with history listed below presenting to the [facility] for f/u of type 2 diabetes. Please see individualized problem based charting for full HPI.  Past Medical History:  Diagnosis Date  . Acute pain of left foot 05/03/2017  . Asthma   . Asthma, chronic, mild persistent, uncomplicated 10/26/2016  . Charcot ankle, left 05/31/2017  . CHF (congestive heart failure) (HCC)   . Chronic systolic (congestive) heart failure (HCC)   . Diabetes mellitus type 2 in obese (HCC) 04/20/2016  . Diabetic polyneuropathy associated with type 2 diabetes mellitus (HCC) 03/14/2017  . Essential hypertension 10/26/2016  . Hypertension   . Obesity   . Obesity, Class III, BMI 40-49.9 (morbid obesity) (HCC) 10/26/2016  . OSA (obstructive sleep apnea) 12/02/2016  . Type 2 diabetes mellitus with diabetic neuropathy (HCC)     Review of Systems:  Negative aside from that listed in individualized problem based charting.  Physical Exam:  Vitals:   06/17/20 0906  BP: 107/71  Pulse: 95  Temp: 97.8 F (36.6 C)  TempSrc: Oral  SpO2: 96%  Weight: (!) 409 lb 4.8 oz (185.7 kg)  Height: 6\' 2"  (1.88 m)   Physical Exam Constitutional:      Appearance: He is obese.     Comments: Seated in wheelchair, NAD.  HENT:     Mouth/Throat:     Mouth: Mucous membranes are moist.     Pharynx: Oropharynx is clear. No oropharyngeal exudate.  Eyes:     Extraocular Movements: Extraocular movements intact.     Conjunctiva/sclera: Conjunctivae normal.     Pupils: Pupils are equal, round, and reactive to light.  Cardiovascular:     Rate and Rhythm: Normal rate and regular rhythm.     Pulses: Normal pulses.     Heart sounds: Normal heart sounds. No murmur heard. No friction rub. No gallop.   Pulmonary:     Effort: Pulmonary effort is normal. No respiratory distress.     Breath sounds: Normal breath sounds. No wheezing, rhonchi or rales.  Abdominal:     General: Bowel sounds  are normal. There is no distension.     Palpations: Abdomen is soft.     Tenderness: There is no abdominal tenderness.  Musculoskeletal:        General: Swelling (2+ pitting edema in bilateral lower extremities up to mid-shins) present. Normal range of motion.     Cervical back: Normal range of motion.  Skin:    General: Skin is warm and dry.  Neurological:     General: No focal deficit present.     Mental Status: He is alert and oriented to person, place, and time.  Psychiatric:        Mood and Affect: Mood normal.        Behavior: Behavior normal.      Assessment & Plan:   See Encounters Tab for problem based charting.  Patient discussed with Dr. 

## 2020-06-17 NOTE — Assessment & Plan Note (Signed)
Vitals:   06/17/20 0906  BP: 107/71  Pulse: 95  Temp: 97.8 F (36.6 C)  SpO2: 96%   Patient with history of HTN on norvasc 5mg , coreg 12.5mg  BID, Entresto 97-103mg , aldactone 25mg , torsemide 20mg  BID, and metolazone 2.5mg  prn. -BP very well controlled today  -continue current medications

## 2020-06-18 ENCOUNTER — Telehealth: Payer: Self-pay

## 2020-06-18 MED ORDER — GLIMEPIRIDE 2 MG PO TABS
2.0000 mg | ORAL_TABLET | Freq: Every day | ORAL | 3 refills | Status: DC
Start: 2020-06-18 — End: 2020-07-08

## 2020-06-18 NOTE — Telephone Encounter (Signed)
Done. Thank you.

## 2020-06-18 NOTE — Telephone Encounter (Signed)
Received fax from pharmacy which states Dr. Lorn Junes number not active.  Asking if another MD can resend RX for Glimepiride.  Sending to red team and attendings. Thank you! SChaplin, RN,BSN

## 2020-06-18 NOTE — Addendum Note (Signed)
Addended by: Erlinda Hong T on: 06/18/2020 11:12 AM   Modules accepted: Orders

## 2020-06-19 ENCOUNTER — Telehealth: Payer: Self-pay

## 2020-06-19 ENCOUNTER — Telehealth: Payer: Self-pay | Admitting: *Deleted

## 2020-06-19 ENCOUNTER — Other Ambulatory Visit: Payer: Self-pay | Admitting: *Deleted

## 2020-06-19 DIAGNOSIS — E1169 Type 2 diabetes mellitus with other specified complication: Secondary | ICD-10-CM

## 2020-06-19 DIAGNOSIS — E669 Obesity, unspecified: Secondary | ICD-10-CM

## 2020-06-19 MED ORDER — SITAGLIPTIN PHOSPHATE 100 MG PO TABS: 100.0000 mg | ORAL_TABLET | Freq: Every day | ORAL | 1 refills | Status: DC

## 2020-06-19 NOTE — Telephone Encounter (Signed)
   Telephone encounter was:  Successful.  06/19/2020 Name: Jonathan [name] MRN: [id number] DOB: December 20, 1985  Jonathan [name] is a 35 y.o. year old male who is a primary care patient of Dolan Amen, MD . The community resource team was consulted for assistance with Patient provided email information for black mold to the housing coalition and [place]   Care guide performed the following interventions: Patient provided with information about care guide support team and interviewed to confirm resource needs Discussed resources to assist with Patient provided email information for black mold to the housing coalition and [place] .  Follow Up Plan:  No further follow up planned at this time. The patient has been provided with needed resources.  Alois Cliche -[facility] Guide , Embedded Care Coordination [facility], Care Management  [phone] [address] [place] , [place] [address] Email : [name]

## 2020-06-19 NOTE — Telephone Encounter (Signed)
Returned call to [facility] Delivered. No answer after 6 minutes on hold.

## 2020-06-19 NOTE — Telephone Encounter (Signed)
Pls contact Myeesha [phone]

## 2020-06-19 NOTE — Telephone Encounter (Signed)
[facility] from Dr Austin Miles who's not medicaid approved. I will ask The Attending to re-send rx.  Thanks

## 2020-06-23 ENCOUNTER — Other Ambulatory Visit: Payer: Self-pay | Admitting: Dietician

## 2020-06-23 DIAGNOSIS — E669 Obesity, unspecified: Secondary | ICD-10-CM

## 2020-06-23 DIAGNOSIS — E1169 Type 2 diabetes mellitus with other specified complication: Secondary | ICD-10-CM

## 2020-06-23 MED ORDER — ACCU-CHEK SOFTCLIX LANCETS MISC: 11 refills | Status: DC

## 2020-06-23 MED ORDER — ACCU-CHEK GUIDE W/DEVICE KIT
PACK | 1 refills | Status: DC
Start: 2020-06-23 — End: 2021-03-12

## 2020-06-23 MED ORDER — ACCU-CHEK GUIDE VI STRP: ORAL_STRIP | 11 refills | Status: AC

## 2020-06-23 MED ORDER — ACCU-CHEK SOFTCLIX LANCETS MISC: 11 refills | Status: AC

## 2020-06-23 NOTE — Telephone Encounter (Signed)
Needs a new glucometer and supplies as soon as possible. Victorino Dike asks [place] to call mr Simonin. Spoke with Mr,. Radle who asked [facility] [address]  Jennifer's number:[phone]

## 2020-06-24 ENCOUNTER — Other Ambulatory Visit: Payer: Self-pay | Admitting: Dietician

## 2020-06-24 DIAGNOSIS — E1169 Type 2 diabetes mellitus with other specified complication: Secondary | ICD-10-CM

## 2020-06-24 NOTE — Progress Notes (Signed)
Internal Medicine Clinic Attending  Case discussed with Dr. Kirke Corin  At the time of the visit.  We reviewed the resident's history and exam and pertinent patient test results.  I agree with the assessment, diagnosis, and plan of care documented in the resident's note. Few options for DM management acceptable to [name].

## 2020-06-24 NOTE — Telephone Encounter (Signed)
Call was transferred to me from mail order for his meter and supplies to find out if we received a faxed order form. Explained that a meter and [facility]. She is refaxing the form.

## 2020-06-24 NOTE — Telephone Encounter (Signed)
Meter and supplies are ready. supplies are no charge, the meter is $14.99. sample meter offered to patient and put at front desk for him to pick up at his convenience. Suggest referral for continued Diabetes Self Management Education & Support.

## 2020-06-24 NOTE — Progress Notes (Signed)
Referral request

## 2020-07-01 ENCOUNTER — Telehealth: Payer: Self-pay | Admitting: Dietician

## 2020-07-01 NOTE — Telephone Encounter (Addendum)
Call from a Jonathan [name] who is with Home care services regarding his CGM order. The Pharmacy we sent his order to is out of network, he needs to use the They need Form faxed to Dr winters- confirm receipt, filled out and returned. Please call stephanie with questions.   Form found and faxed back to [facility]

## 2020-07-02 DIAGNOSIS — E1169 Type 2 diabetes mellitus with other specified complication: Secondary | ICD-10-CM | POA: Diagnosis not present

## 2020-07-02 DIAGNOSIS — E119 Type 2 diabetes mellitus without complications: Secondary | ICD-10-CM | POA: Diagnosis not present

## 2020-07-02 NOTE — Telephone Encounter (Signed)
Confirmed with Hme the form was placed in your box on Monday.

## 2020-07-02 NOTE — Telephone Encounter (Signed)
I found it. Thank you!

## 2020-07-08 ENCOUNTER — Other Ambulatory Visit: Payer: Self-pay

## 2020-07-08 ENCOUNTER — Encounter: Payer: Self-pay | Admitting: Student

## 2020-07-08 ENCOUNTER — Ambulatory Visit (INDEPENDENT_AMBULATORY_CARE_PROVIDER_SITE_OTHER): Payer: Medicaid Other | Admitting: Student

## 2020-07-08 VITALS — BP 129/71 | HR 96 | Temp 98.7°F | Ht 74.0 in | Wt 395.5 lb

## 2020-07-08 DIAGNOSIS — E1169 Type 2 diabetes mellitus with other specified complication: Secondary | ICD-10-CM

## 2020-07-08 DIAGNOSIS — E669 Obesity, unspecified: Secondary | ICD-10-CM

## 2020-07-08 MED ORDER — TRULICITY 0.75 MG/0.5ML ~~LOC~~ SOAJ: 0.7500 mg | SUBCUTANEOUS | 2 refills | Status: DC

## 2020-07-08 NOTE — Progress Notes (Signed)
   CC: f/u of diabetes  HPI:  [name] is a 35 y.o. male with history listed below presenting to the [facility] for f/u diabetes. Please see individualized problem based charting for full HPI.  Past Medical History:  Diagnosis Date  . Acute pain of left foot 05/03/2017  . Asthma   . Asthma, chronic, mild persistent, uncomplicated 10/26/2016  . Charcot ankle, left 05/31/2017  . CHF (congestive heart failure) (HCC)   . Chronic systolic (congestive) heart failure (HCC)   . Diabetes mellitus type 2 in obese (HCC) 04/20/2016  . Diabetic polyneuropathy associated with type 2 diabetes mellitus (HCC) 03/14/2017  . Essential hypertension 10/26/2016  . Hypertension   . Obesity   . Obesity, Class III, BMI 40-49.9 (morbid obesity) (HCC) 10/26/2016  . OSA (obstructive sleep apnea) 12/02/2016  . Type 2 diabetes mellitus with diabetic neuropathy (HCC)     Review of Systems:  Negative aside from that listed in individualized problem based charting.  Physical Exam:  Vitals:   07/08/20 1022  BP: 129/71  Pulse: 96  Temp: 98.7 F (37.1 C)  TempSrc: Oral  SpO2: 99%  Weight: (!) 395 lb 8 oz (179.4 kg)  Height: 6\' 2"  (1.88 m)   Physical Exam Constitutional:      Comments: Severely obese  HENT:     Head: Normocephalic and atraumatic.     Nose: Nose normal. No congestion.  Eyes:     Extraocular Movements: Extraocular movements intact.     Conjunctiva/sclera: Conjunctivae normal.     Pupils: Pupils are equal, round, and reactive to light.  Cardiovascular:     Rate and Rhythm: Normal rate and regular rhythm.     Pulses: Normal pulses.     Heart sounds: Normal heart sounds. No murmur heard. No friction rub. No gallop.   Pulmonary:     Effort: Pulmonary effort is normal.     Breath sounds: Normal breath sounds. No wheezing, rhonchi or rales.  Musculoskeletal:        General: Swelling present. Normal range of motion.     Cervical back: Normal range of motion.  Skin:    General: Skin is warm  and dry.  Neurological:     General: No focal deficit present.     Mental Status: He is alert and oriented to person, place, and time.  Psychiatric:        Mood and Affect: Mood normal.        Behavior: Behavior normal.      Assessment & Plan:   See Encounters Tab for problem based charting.  Patient discussed with Dr. 

## 2020-07-08 NOTE — Patient Instructions (Signed)
[name],  It was a pleasure seeing you in the clinic today.   1. Please stop taking glimepiride. Instead, please start taking Trulicity once a week as prescribed along with your Januvia. Also, please try to include exercise and diet into your lifestyle.  2. Please come back in 1 month.  Please call our clinic at [phone] if you have any questions or concerns. The best time to call is Monday-Friday from 9am-4pm, but there is someone available 24/7 at the same number. If you need medication refills, please notify your pharmacy one week in advance and they will send [place] a request.   Thank you for letting us take part in your care. We look forward to seeing you next time!

## 2020-07-08 NOTE — Assessment & Plan Note (Signed)
Patient with long history of uncontrolled T2DM with most recent HbA1c >14%. He is currently on [place] 100mg  daily and glimepiride 2mg  daily. He reports being unable to tolerate glimepiride due to nausea and headaches that developed after starting glimepiride. He has developed adverse effects to most other antidiabetic medications (including metformin, glipizide, insulin, jardiance). Discussed adding trulicity 0.75mg  weekly to , which patient was initially hesitant with but agreed on starting.   Have had several lengthy discussions in regards to consequences of severely uncontrolled T2DM and he seems to understand these but still is adamant about not wanting to try insulin therapy.  Plan: -continue 100mg  -start trulicity 0.75mg  weekly -f/u in 1 month -repeat HbA1c in 2 months -continued discussions about health risks of uncontrolled T2DM and starting insulin therapy for better control

## 2020-07-09 NOTE — Progress Notes (Signed)
Internal Medicine Clinic Attending  Case discussed with Dr. Jinwala  At the time of the visit.  We reviewed the resident's history and exam and pertinent patient test results.  I agree with the assessment, diagnosis, and plan of care documented in the resident's note.  

## 2020-07-10 ENCOUNTER — Other Ambulatory Visit: Payer: Self-pay | Admitting: *Deleted

## 2020-07-10 ENCOUNTER — Telehealth: Payer: Self-pay | Admitting: *Deleted

## 2020-07-15 ENCOUNTER — Other Ambulatory Visit (HOSPITAL_COMMUNITY): Payer: Self-pay | Admitting: Adult Health

## 2020-07-15 ENCOUNTER — Other Ambulatory Visit: Payer: Self-pay | Admitting: Student

## 2020-07-17 ENCOUNTER — Encounter: Payer: Medicaid Other | Admitting: Dietician

## 2020-07-23 ENCOUNTER — Other Ambulatory Visit: Payer: Self-pay

## 2020-07-23 ENCOUNTER — Encounter: Payer: Self-pay | Admitting: Cardiology

## 2020-07-23 ENCOUNTER — Telehealth (INDEPENDENT_AMBULATORY_CARE_PROVIDER_SITE_OTHER): Payer: Medicaid Other | Admitting: Cardiology

## 2020-07-23 VITALS — Ht 74.0 in | Wt 380.0 lb

## 2020-07-23 DIAGNOSIS — G4733 Obstructive sleep apnea (adult) (pediatric): Secondary | ICD-10-CM | POA: Diagnosis not present

## 2020-07-23 DIAGNOSIS — I10 Essential (primary) hypertension: Secondary | ICD-10-CM | POA: Diagnosis not present

## 2020-07-23 NOTE — Patient Instructions (Addendum)
Medication Instructions:  Your physician recommends that you continue on your current medications as directed. Please refer to the Current Medication list given to you today.  *If you need a refill on your cardiac medications before your next appointment, please call your pharmacy*  Follow-Up: At [facility], you and your health needs are our priority.  As part of our continuing mission to provide you with exceptional heart care, we have created designated Provider Care Teams.  These Care Teams include your primary Cardiologist (physician) and Advanced Practice Providers (APPs -  Physician Assistants and Nurse Practitioners) who all work together to provide you with the care you need, when you need it.  Your next appointment:   6 month(s)  The format for your next appointment:   In Person  Provider:   You may see Armanda Magic, MD or one of the following Advanced Practice Providers on your designated Care Team:    Ronie Spies, PA-C  Jacolyn Reedy, PA-C  Other Instructions You have been referred to the [facility] Program and the [facility]

## 2020-07-23 NOTE — Progress Notes (Signed)
Virtual Visit via Video Note   This visit type was conducted due to national recommendations for restrictions regarding the COVID-19 Pandemic (e.g. social distancing) in an effort to limit this patient's exposure and mitigate transmission in our community.  Due to his co-morbid illnesses, this patient is at least at moderate risk for complications without adequate follow up.  This format is felt to be most appropriate for this patient at this time.  All issues noted in this document were discussed and addressed.  A limited physical exam was performed with this format.  Please refer to the patient's chart for his consent to telehealth for [facility].  Date:  07/23/2020   ID:  Jonathan [name], DOB [birth year], MRN [id number] The patient was identified using 2 identifiers.  Patient Location: Home Provider Location: Office/Clinic  PCP:  [name], MD  Cardiologist:  Fransico Him, MD   Chief Complaint  Patient presents with  . Sleep Apnea  . Hypertension    History of Present Illness:  Jonathan [name] is a 35 y.o. male with a hx of chronic systolic CHF, DM2, HTN, asthma and OSA .  He has a hx of mild OSA with an AHI of 5.2/hr overall, 9.8/hr when sleeping supine and 16.7/hr during REM sleep. O2 sats dropped to 86% during apneas but the mean O2 sat was 92%.  He had successful CPAP titration 13 cm H2O.  I saw him in 2019 and he was very noncompliant with his device feeling like he was smothering with the FFM.  He was switched to auto CPAP but never followed back up and was not compliant with the PAP therapy his insurance made him hand it back in.  He said that the [facility] did not fit because he sleeps on his stomach.    When I last saw him he was feeling very tired in the am and sometimes had to sleep during the day.  He would wake up choking in his sleep.  He snores during the night and he has had witnessed apneas in his sleep. He underwent repeat sleep study to get back on PAP and showed severe  OSA with an AHI of 65.2/hr and O2 sats as low as 65% but could not be adequately titrated on CPAP due to ongoing events and underwent BiPAP titration and placed on auto BiPAP.    He is doing well with his PAP device and thinks that he has gotten used to it.  He tolerates the mask and feels the pressure is adequate.  Since going on PAP he does not really feel any more rested in the am but does not feel like he has been being choked all night and has no significant daytime sleepiness.  He denies any significant nasal dryness or nasal congestion.  He occasionally has some mild mouth dryness. He does not think that he snores.    Past Medical History:  Diagnosis Date  . Acute pain of left foot 05/03/2017  . Asthma   . Asthma, chronic, mild persistent, uncomplicated 03/29/2480  . Charcot ankle, left 05/31/2017  . CHF (congestive heart failure) ([place])   . Chronic systolic (congestive) heart failure ([place])   . Diabetes mellitus type 2 in obese ([place]) 04/20/2016  . Diabetic polyneuropathy associated with type 2 diabetes mellitus ([place]) 03/14/2017  . Essential hypertension 10/26/2016  . Hypertension   . Obesity   . Obesity, Class III, BMI 40-49.9 (morbid obesity) ([place]) 10/26/2016  . OSA (obstructive sleep apnea) 12/02/2016  . Type 2  diabetes mellitus with diabetic neuropathy [facility])     Past Surgical History:  Procedure Laterality Date  . CARDIAC CATHETERIZATION    . FOOT SURGERY     arch surgery    Current Medications: Current Meds  Medication Sig  . Accu-Chek Softclix Lancets lancets Check blood sugar 3x a day  . blood glucose meter kit and supplies KIT Dispense based on patient and insurance preference. Use up to four times daily as directed. (FOR ICD-9 250.00, 250.01). For QAC - HS accuchecks. May switch to any brand.  . Blood Glucose Monitoring Suppl (ACCU-CHEK GUIDE) w/Device KIT Check blood sugar 3 times a day  . carvedilol (COREG) 25 MG tablet Take 1 tablet (25 mg total) by mouth 2 (two) times  daily.  . digoxin (LANOXIN) 0.125 MG tablet Take 1 tablet (0.125 mg total) by mouth daily.  . Dulaglutide (TRULICITY) 8.84 ZY/6.0YT SOPN Inject 0.75 mg into the skin once a week.  Marland Kitchen ENTRESTO 97-103 MG TAKE 1 TABLET BY MOUTH 2 (TWO) TIMES DAILY.  Marland Kitchen gabapentin (NEURONTIN) 300 MG capsule TAKE 1 CAPSULE (300 MG TOTAL) BY MOUTH AS NEEDED. (Patient taking differently: Take 300 mg by mouth as needed (NERVE PAIN).)  . glucose blood (ACCU-CHEK GUIDE) test strip Check blood sugar 3 times per day  . KLOR-CON M20 20 MEQ tablet TAKE 2 TABLETS BY MOUTH AS NEEDED. WHEN YOU TAKE METOLAZONE  . metolazone (ZAROXOLYN) 2.5 MG tablet Take 1 tablet (2.5 mg total) by mouth daily as needed (1-2 times per month). As directed by HF clinic for weight gain of 3 lbs overnight or 5 lbs within one week.  . sitaGLIPtin (JANUVIA) 100 MG tablet Take 1 tablet (100 mg total) by mouth daily.  Marland Kitchen spironolactone (ALDACTONE) 25 MG tablet Take 1 tablet (25 mg total) by mouth daily.  Marland Kitchen torsemide (DEMADEX) 20 MG tablet Take 3 tablets (60 mg total) by mouth daily.    Allergies:   Hydrocodone   Social History   Socioeconomic History  . Marital status: Married    Spouse name: Not on file  . Number of children: Not on file  . Years of education: Not on file  . Highest education level: Not on file  Occupational History  . Not on file  Tobacco Use  . Smoking status: Never Smoker  . Smokeless tobacco: Never Used  Vaping Use  . Vaping Use: Never used  Substance and Sexual Activity  . Alcohol use: No  . Drug use: No  . Sexual activity: Never  Other Topics Concern  . Not on file  Social History Narrative  . Not on file   Social Determinants of Health   Financial Resource Strain: Medium Risk  . Difficulty of Paying Living Expenses: Somewhat hard  Food Insecurity: No Food Insecurity  . Worried About Charity fundraiser in the Last Year: Never true  . Ran Out of Food in the Last Year: Never true  Transportation Needs: No  Transportation Needs  . Lack of Transportation (Medical): No  . Lack of Transportation (Non-Medical): No  Physical Activity: Not on file  Stress: Not on file  Social Connections: Not on file     Family History:  The patient's family history includes Diabetes Mellitus II in his mother; Heart disease in his father and mother; Heart failure in his father and mother; Hypertension in his mother; Kidney disease in his father.   ROS:   Please see the history of present illness.    ROS All other systems  reviewed and are negative.  No flowsheet data found.     PHYSICAL EXAM:   VS:  Ht $R'6\' 2"'IC$  (1.88 m)   Wt (!) 380 lb (172.4 kg)   BMI 48.79 kg/m     Well nourished, well developed male in no acute distress. Well appearing, alert and conversant, regular work of breathing,  good skin color  Eyes- anicteric mouth- oral mucosa is pink  neuro- grossly intact skin- no apparent rash or lesions or cyanosis   Wt Readings from Last 3 Encounters:  07/23/20 (!) 380 lb (172.4 kg)  07/08/20 (!) 395 lb 8 oz (179.4 kg)  06/17/20 (!) 409 lb 4.8 oz (185.7 kg)      Studies/Labs Reviewed:   EKG:  EKG is not ordered today.   Recent Labs: 11/09/2019: Hemoglobin 14.8; Platelets 238 05/01/2020: B Natriuretic Peptide 862.1 05/29/2020: BUN 11; Creatinine, Ser 0.93; Potassium 4.2; Sodium 131   Lipid Panel    Component Value Date/Time   CHOL 135 10/27/2016 0221   TRIG 83 10/27/2016 0221   HDL 22 (L) 10/27/2016 0221   CHOLHDL 6.1 10/27/2016 0221   VLDL 17 10/27/2016 0221   LDLCALC 96 10/27/2016 0221    Additional studies/ records that were reviewed today include:  PAP compliance download, sleep study and PAP titration    ASSESSMENT:    1. OSA (obstructive sleep apnea)   2. Essential hypertension   3. Morbid obesity ([place])      PLAN:  In order of problems listed above:  1. OSA - The patient is tolerating PAP therapy well without any problems. The PAP download performed by his DME was  personally reviewed and interpreted by me today and showed an AHI of 1.2/hr on auto BiPAP  with 73% compliance in using more than 4 hours nightly.  The patient has been using and benefiting from PAP use and will continue to benefit from therapy.  -I encouraged him to read his instruction booklet regarding adjusting his humidity  2. HTN -BP is controlled when he checks it at home -Continue prescription drug management with Carvedilol $RemoveBeforeD'25mg'leIDiZpQwgAcbv$  BID, amlodipine $RemoveBeforeD'5mg'rIStIadnSTWlqF$  daily, spiro $RemoveBefo'25mg'obCJrJpFVcZ$  daily and Entresto 97-$RemoveBeforeD'103mg'loPAYAkzQnVOIw$  BID with PRN refills -I have personally reviewed and interpreted outside labs performed by patient's PCP which showed SCr 1.93, K+ 4.2 and TSH 2.2 from March 2022  3.  Morbid Obesity -His BMI is very high at 48 and this is greatly impacting  -I have encouraged him to get into a routine exercise program and cut back on carbs and portions.  -I will refer him to the [place] exercise program and healthy weight and wellness program at [facility] to see if we can help him get this weight off   COVID-19 Education: The signs and symptoms of COVID-19 were discussed with the patient and how to seek care for testing (follow up with PCP or arrange E-visit).  The importance of social distancing was discussed today.  Time:   I spent 20 minutes dedicated to the care of this patient on the date of this encounter to include pre-visit review of rec ords, face-to-face time with the patient discussing results of split night sleep study, BiPAP titration study and PAP download, and post visit ordering of testing.    Medication Adjustments/Labs and Tests Ordered: Current medicines are reviewed at length with the patient today.  Concerns regarding medicines are outlined above.  Medication changes, Labs and Tests ordered today are listed in the Patient Instructions below.  There are no Patient Instructions on file  for this visit.   Signed, Fransico Him, MD  07/23/2020 10:23 AM    [facility] [address], [place], [place]  [address] Phone: [phone]; Fax: [phone]

## 2020-07-23 NOTE — Addendum Note (Signed)
Addended by: Theresia Majors on: 07/23/2020 10:35 AM   Modules accepted: Orders

## 2020-07-28 ENCOUNTER — Telehealth: Payer: Self-pay | Admitting: Internal Medicine

## 2020-07-28 NOTE — Telephone Encounter (Signed)
..   Medicaid Managed Care   Unsuccessful Outreach Note  07/28/2020 Name: Jonathan [name] MRN: [id number] DOB: 10/05/1985  Referred by: Dolan Amen, MD Reason for referral : High Risk Managed Medicaid [facility] Team recvd a referral for [name]. I attempted to reach him this morning to get him scheduled with the [facility] but his VM was full.)   An unsuccessful telephone outreach was attempted today. The patient was referred to the case management team for assistance with care management and care coordination.   Follow Up Plan: The care management team will reach out to the patient again over the next 7-14 days.   Weston Settle Care Guide, High Risk Medicaid Managed Care Embedded Care Coordination [facility]  [facility]

## 2020-08-06 ENCOUNTER — Ambulatory Visit (INDEPENDENT_AMBULATORY_CARE_PROVIDER_SITE_OTHER): Payer: Medicaid Other | Admitting: Pharmacist

## 2020-08-06 DIAGNOSIS — E669 Obesity, unspecified: Secondary | ICD-10-CM

## 2020-08-06 DIAGNOSIS — E1169 Type 2 diabetes mellitus with other specified complication: Secondary | ICD-10-CM | POA: Diagnosis not present

## 2020-08-06 MED ORDER — DAPAGLIFLOZIN PROPANEDIOL 5 MG PO TABS
5.0000 mg | ORAL_TABLET | Freq: Every day | ORAL | 3 refills | Status: DC
Start: 2020-08-06 — End: 2020-10-16

## 2020-08-06 NOTE — Progress Notes (Signed)
Subjective:    Patient ID: Jonathan [name], male    DOB: [birth year], 35 y.o.   MRN: [id number]  HPI Patient is a 35 y.o. male who presents for diabetes management. He is in good spirits and presents with assistance of wheelchair with his wife. Patient was referred on 07/28/20 and last seen by provider, Dr. Austin Miles, on 07/08/20.  Insurance coverage/medication affordability: Medicaid   Current diabetes medications include: Januvia 100mg , has yet to take Trulicity Patient states that She is taking his diabetes medications as prescribed. Patient reports adherence with medications.   Does you feel that your medications are working for you?  yes  Have you been experiencing any side effects to the medications prescribed? no  Do you have any problems obtaining medications due to transportation or finances?  Yes; had issues getting Trulicity from pharmacy   Patient reports that if his wife is working (she works ) that he will only eat around one meal a day which is a peanut butter and jelly sandwich. If his wife is home to cook she states that she makes 3 meals a day but that it is heavy in protein and they try to incorporate vegetables. She states for a snack sometimes she will make the patient a fruit salad but that she portions everything out to make sure she doesn't cause his blood glucose to rise too high. Patient reports that he drinks mostly water and on occasion almond milk.   Patient-reported exercise habits: denies   Patient denies hypoglycemic events. Patient denies polyuria (increased urination).  Patient denies polyphagia (increased appetite).  Patient reports polydipsia (increased thirst).  Patient reports neuropathy (nerve pain). Patient denies visual changes. Patient reports self foot exams.   Home fasting blood sugars: 290, 287 2 hour post-meal/random blood sugars: 274  Objective:   Labs:   Lab Results  Component Value Date   HGBA1C >14.0 (A) 06/17/2020   HGBA1C  11.6 (A) 03/11/2020   HGBA1C 10.4 (A) 04/09/2019    Lab Results  Component Value Date   MICRALBCREAT <7 12/08/2018    Lipid Panel     Component Value Date/Time   CHOL 135 10/27/2016 0221   TRIG 83 10/27/2016 0221   HDL 22 (L) 10/27/2016 0221   CHOLHDL 6.1 10/27/2016 0221   VLDL 17 10/27/2016 0221   LDLCALC 96 10/27/2016 0221    Assessment/Plan:   T2DM is not controlled. Medication adherence appears optimal. Additional pharmacotherapy is warranted. Discussed with patient importance of beginning Trulicity despite patient being hesitant due to fear of needles. Patient agreed and willing to start using Trulicity this week. Will discontinue Januvia when he begins Trulicity. Will also initiate Farxiga 5mg  once daily. Patient reports previously being on this medication before moving to [place] and tolerating it well. Patient did have issues with developing yeast infections on Jardiance but would like to try 12/27/2016 again. If patient develops yeast infections will discontinue. Patient educated on purpose, proper use and potential adverse effects of .  Following instruction patient verbalized understanding of treatment plan. Patient also aware to begin checking blood glucose more frequently, at least once every morning.  Started GLP-1 dulaglutide (Trulicity) 0.75mg  once weekly.  Started SGLT2-I dapagliflozin [place]) 5mg  once daily.  Extensively discussed pathophysiology of diabetes, dietary effects on blood sugar control, and recommended lifestyle interventions,  Patient will adhere to dietary modifications Counseled on s/sx of and management of hypoglycemia Next A1C anticipated August 2022.   Follow-up appointment in one week with PCP/pharmacy clinic to review sugar  readings. Written patient instructions provided.  This appointment required 60 minutes of patient care (this includes precharting, chart review, review of results, and face-to-face care).  Thank you for involving pharmacy  to assist in providing this patient's care.

## 2020-08-06 NOTE — Patient Instructions (Signed)
Jonathan [name] it was a pleasure seeing you today.   Please do the following:  START Trulicity and STOP Januvia as directed today during your appointment. I also sent in a prescription to START FARXIGA. If you have any questions or if you believe something has occurred because of this change, call me or your doctor to let one of [place] know.  Continue checking blood sugars at home. It's really important that you record these and bring these in to your next doctor's appointment.  Continue making the lifestyle changes we've discussed together during our visit. Diet and exercise play a significant role in improving your blood sugars.  Follow-up with me/PCP in one week.   Hypoglycemia or low blood sugar:   Low blood sugar can happen quickly and may become an emergency if not treated right away.   While this shouldn't happen often, it can be brought upon if you skip a meal or do not eat enough. Also, if your insulin or other diabetes medications are dosed too high, this can cause your blood sugar to go to low.   Warning signs of low blood sugar include: Feeling shaky or dizzy Feeling weak or tired  Excessive hunger Feeling anxious or upset  Sweating even when you aren't exercising  What to do if I experience low blood sugar? Follow the Rule of 15 Check your blood sugar with your meter. If lower than 70, proceed to step 2.  Treat with 15 grams of fast acting carbs which is found in 3-4 glucose tablets. If none are available you can try hard candy, 1 tablespoon of sugar or honey,4 ounces of fruit juice, or 6 ounces of REGULAR soda.  Re-check your sugar in 15 minutes. If it is still below 70, do what you did in step 2 again. If your blood sugar has come back up, go ahead and eat a snack or small meal made up of complex carbs (ex. Whole grains) and protein at this time to avoid recurrence of low blood sugar.

## 2020-08-07 NOTE — Assessment & Plan Note (Signed)
T2DM is not controlled. Medication adherence appears optimal. Additional pharmacotherapy is warranted. Discussed with patient importance of beginning Trulicity despite patient being hesitant due to fear of needles. Patient agreed and willing to start using Trulicity this week. Will discontinue Januvia when he begins Trulicity. Will also initiate Farxiga 5mg  once daily. Patient reports previously being on this medication before moving to [place] and tolerating it well. Patient did have issues with developing yeast infections on Jardiance but would like to try again. If patient develops yeast infections will discontinue. Patient educated on purpose, proper use and potential adverse effects of [place].  Following instruction patient verbalized understanding of treatment plan. Patient also aware to begin checking blood glucose more frequently, at least once every morning.  1. Started GLP-1 dulaglutide (Trulicity) 0.75mg  once weekly.  2. Started SGLT2-I dapagliflozin (Farxiga) 5mg  once daily.  3. Extensively discussed pathophysiology of diabetes, dietary effects on blood sugar control, and recommended lifestyle interventions,  4. Patient will adhere to dietary modifications 5. Counseled on s/sx of and management of hypoglycemia 6. Next A1C anticipated August 2022.

## 2020-08-12 ENCOUNTER — Other Ambulatory Visit: Payer: Self-pay

## 2020-08-12 ENCOUNTER — Other Ambulatory Visit: Payer: Self-pay | Admitting: *Deleted

## 2020-08-12 NOTE — Patient Instructions (Signed)
Visit Information  [name] was given information about Medicaid Managed Care team care coordination services as a part of their [place] Medicaid benefit. Jonathan [name] verbally consented to engagement with the [facility] Managed Care team.   For questions related to your [facility], please call: [phone] or visit the homepage here: [email or web address]  If you would like to schedule transportation through your [facility], please call the following number at least 2 days in advance of your appointment: [phone].   Call the [place] at [phone], at any time, 24 hours a day, 7 days a week. If you are in danger or need immediate medical attention call 911.  Jonathan [name] - following are the goals we discussed in your visit today:   Goals Addressed             This Visit's Progress    Monitor and Manage My Blood Sugar-Diabetes Type 2       Timeframe:  Long-Range Goal Priority:  High Start Date:   08/12/20                          Expected End Date: 11/13/20                      Follow Up Date 08/28/20    - check blood sugar at prescribed times - check blood sugar if I feel it is too high or too low - enter blood sugar readings and medication or insulin into daily log - take the blood sugar log to all doctor visits - take the blood sugar meter to all doctor visits - work on eating 3 small meals a day, including lean protein, limiting carbohydrates - increase exercise, example-take a 10 minute walk after each meal  - attend appointment with PCP 6/29 - take all medications as prescribed   Why is this important?   Checking your blood sugar at home helps to keep it from getting very high or very low.  Writing the results in a diary or log helps the doctor know how to care for you.  Your blood sugar log should have the time, date  and the results.  Also, write down the amount of insulin or other medicine that you take.  Other information, like what you ate, exercise done and how you were feeling, will also be helpful.           Track and Manage Symptoms-Heart Failure       Timeframe:  Long-Range Goal Priority:  High Start Date:  08/12/20                           Expected End Date:  11/13/20                     Follow Up Date 08/28/20    - eat more whole grains, fruits and vegetables, lean meats and healthy fats - know when to call the doctor - track symptoms and what helps feel better or worse - weigh daily, first thing in the morning after emptying your bladder. Report weight change of 3# in one day or 5# in a week  - take all medication as prescribed - attend appointment on 7/14 with Dr. Haroldine Laws   Why is this important?   You will be able to handle your symptoms better  if you keep track of them.  Making some simple changes to your lifestyle will help.  Eating healthy is one thing you can do to take good care of yourself.             Please see education materials related to diabetes and CHF provided by MyChart link.  Patient verbalizes understanding of instructions provided today.   Telephone follow up appointment with Managed Medicaid care management team member scheduled for:08/28/20 @ [place] RN, BSN [place] RN Care Coordinator   Following is a copy of your plan of care:  Patient Care Plan: Diabetes Type 2 (Adult)     Problem Identified: Glycemic Management (Diabetes, Type 2)      Long-Range Goal: Glycemic Management Optimized   Start Date: 08/12/2020  Expected End Date: 11/13/2020  This Visit's Progress: On track  Priority: High  Note:   Objective:  Lab Results  Component Value Date   HGBA1C >14.0 (A) 06/17/2020   Lab Results  Component Value Date   CREATININE 0.93 05/29/2020   CREATININE 0.97 05/01/2020   CREATININE 0.85 02/25/2020    No results found for: EGFR Current Barriers:  Knowledge Deficits related to basic Diabetes pathophysiology and self care/management-Jonathan [name] would like to see his A1C lower than it has been. He has started taking trulicity in hopes that it will improve his readings. He does not like to be stuck, and relies on his spouse to check his blood sugar and administer his injections. He has a decreased appetite, some days not eating at all. Recent fasting readings were 244 and 367. He admits to being emotional and eating a lot of sugary foods the night before the 367 reading. Unable to independently administer injectable medication and monitor blood glucose Case Manager Clinical Goal(s):  patient will demonstrate improved adherence to prescribed treatment plan for diabetes self care/management as evidenced by: daily monitoring and recording of CBG,  adherence to ADA/ carb modified diet, exercise 5 days/week, adherence to prescribed medication regimen, contacting provider for new or worsened symptoms or questions Interventions:  Inter-disciplinary care team collaboration (see longitudinal plan of care) Provided education to patient about basic DM disease process Reviewed medications with patient and discussed importance of medication adherence Discussed plans with patient for ongoing care management follow up and provided patient with direct contact information for care management team Provided patient with written educational materials related to hypo and hyperglycemia and importance of correct treatment Reviewed scheduled/upcoming provider appointments including: PCP on 6/29 Referral made to pharmacy team for assistance with medication management Review of patient status, including review of consultants reports, relevant laboratory and other test results, and medications completed. Encouraged patient to try to eat 3 small meals a day, suggested starting with 2 and work up to 3. These meals should  include a lean protein. Discussed the importance of exercise and encouraged patient to take a 10 minute walk after each meal Discussed ways to divert his attention while being stuck for blood sugar checks and medication administration Self-Care Activities - Self administers oral medications as prescribed Attends all scheduled provider appointments Checks blood sugars as prescribed and utilize hyper and hypoglycemia protocol as needed Adheres to prescribed ADA/carb modified Patient Goals: - check blood sugar at prescribed times - check blood sugar if I feel it is too high or too low - enter blood sugar readings and medication or insulin into daily log - take the blood sugar log to all doctor visits -  take the blood sugar meter to all doctor visits - work on eating 3 small meals a day, including lean protein, limiting carbohydrates - increase exercise, example-take a 10 minute walk after each meal  - attend appointment with PCP 6/29 - take all medications as prescribed  Follow Up Plan: Telephone follow up appointment with care management team member scheduled for:08/28/20 @ 9am     Patient Care Plan: Heart Failure (Adult)     Problem Identified: Symptom Exacerbation (Heart Failure)      Long-Range Goal: Symptom Exacerbation Prevented or Minimized   Start Date: 08/12/2020  Expected End Date: 11/13/2020  This Visit's Progress: On track  Priority: High  Note:   Current Barriers:  Knowledge deficit related to basic heart failure pathophysiology and self care management-Jonathan [name] is managing heart failure with diet and medications. He understands that he should be weighing daily, but sometimes he just forgets. He is limiting his salt intake and trying to eat heart healthy. He does not take torsemide exactly as prescribed due to side effects.  Does not adhere to prescribed medication regimen Case Manager Clinical Goal(s):  patient will weigh self daily and record patient will verbalize  understanding of Heart Failure Action Plan and when to call doctor patient will take all Heart Failure mediations as prescribed patient will weigh daily and record (notifying MD of 3 lb weight gain over night or 5 lb in a week) Interventions:  Basic overview and discussion of pathophysiology of Heart Failure reviewed  Provided verbal education on low sodium diet Provided written education on low sodium diet Assessed need for readable accurate scales in home Advised patient to weigh each morning after emptying bladder Discussed importance of daily weight and advised patient to weigh and record daily Reviewed role of diuretics in prevention of fluid overload and management of heart failure Collaborated with MM Pharmacist, Ovid Curd for medication management Patient Goals/Self-Care Activities - eat more whole grains, fruits and vegetables, lean meats and healthy fats - know when to call the doctor - track symptoms and what helps feel better or worse - weigh daily, first thing in the morning after emptying your bladder. Report weight change of 3# in one day or 5# in a week  - take all medication as prescribed - attend appointment on 7/14 with Dr. Haroldine Laws Follow Up Plan: Telephone follow up appointment with care management team member scheduled for:08/28/20 @ 9am

## 2020-08-12 NOTE — Patient Outreach (Signed)
Medicaid Managed Care   Nurse Care Manager Note  08/12/2020 Name:  Jonathan [name] MRN:  [id number] DOB:  Aug 29, 1985  Jonathan [name] is an 35 y.o. year old male who is a primary patient of [name], MD.  The [facility] Managed Care Coordination team was consulted for assistance with:    CHF DMII  Jonathan [name] was given information about Medicaid Managed Care Coordination team services today. Jonathan [name] agreed to services and verbal consent obtained.  Engaged with patient by telephone for initial visit in response to provider referral for case management and/or care coordination services.   Assessments/Interventions:  Review of past medical history, allergies, medications, health status, including review of consultants reports, laboratory and other test data, was performed as part of comprehensive evaluation and provision of chronic care management services.  SDOH (Social Determinants of Health) assessments and interventions performed:   Care Plan  Allergies  Allergen Reactions   Hydrocodone     Hives     Medications Reviewed Today     Reviewed by Melissa Montane, RN (Registered Nurse) on 08/12/20 at 1405  Med List Status: <None>   Medication Order Taking? Sig Documenting Provider Last Dose Status Informant  Accu-Chek Softclix Lancets lancets [id number] Yes Check blood sugar 3x a day Sherry Ruffing, Marissa M, MD Taking Active   blood glucose meter kit and supplies KIT [id number] Yes Dispense based on patient and insurance preference. Use up to four times daily as directed. (FOR ICD-9 250.00, 250.01). For QAC - HS accuchecks. May switch to any brand. Thurnell Lose, MD Taking Active            Med Note Owens Shark, Jerral Bonito Apr 19, 2019 11:51 AM)    Blood Glucose Monitoring Suppl (ACCU-CHEK GUIDE) w/Device KIT [id number] Yes Check blood sugar 3 times a day [name], MD Taking Active   carvedilol (COREG) 25 MG tablet [id number] Yes Take 1 tablet (25 mg total) by mouth 2 (two)  times daily. Darrick Grinder D, NP Taking Active   dapagliflozin propanediol (FARXIGA) 5 MG TABS tablet [id number] No Take 1 tablet (5 mg total) by mouth daily.  Patient not taking: Reported on 08/12/2020   Angelica Pou, MD Not Taking Active            Med Note Thamas Jaegers, Clotilda Hafer A   Tue Aug 12, 2020  1:56 PM) Waiting for prescription to be filled  digoxin (LANOXIN) 0.125 MG tablet [id number] Yes Take 1 tablet (0.125 mg total) by mouth daily. Bensimhon, Shaune Pascal, MD Taking Active   Dulaglutide (TRULICITY) 8.25 KN/3.9JQ SOPN [id number] Yes Inject 0.75 mg into the skin once a week. Velna Ochs, MD Taking Active   ENTRESTO 97-103 MG [id number] Yes TAKE 1 TABLET BY MOUTH 2 (TWO) TIMES DAILY. Bensimhon, Shaune Pascal, MD Taking Active   gabapentin (NEURONTIN) 300 MG capsule [id number] Yes TAKE 1 CAPSULE (300 MG TOTAL) BY MOUTH AS NEEDED.  Patient taking differently: Take 300 mg by mouth as needed (NERVE PAIN).   Cato Mulligan, MD Taking Active   glucose blood (ACCU-CHEK GUIDE) test strip [id number] Yes Check blood sugar 3 times per day [name], MD Taking Active   KLOR-CON M20 20 MEQ tablet [id number] Yes TAKE 2 TABLETS BY MOUTH AS NEEDED. WHEN YOU TAKE METOLAZONE Bensimhon, Shaune Pascal, MD Taking Active   metolazone (ZAROXOLYN) 2.5 MG tablet [id number] Yes Take 1 tablet (2.5 mg total) by mouth daily as needed (1-2 times per month). As directed by HF clinic for weight  gain of 3 lbs overnight or 5 lbs within one week. Bensimhon, Shaune Pascal, MD Taking Active   sitaGLIPtin (JANUVIA) 100 MG tablet [id number] No Take 1 tablet (100 mg total) by mouth daily.  Patient not taking: Reported on 08/12/2020   Axel Filler, MD Not Taking Active            Med Note Thamas Jaegers, Shaniqwa Horsman A   Tue Aug 12, 2020  2:03 PM) Told to stop taking since starting Trulicity  spironolactone (ALDACTONE) 25 MG tablet [id number] Yes Take 1 tablet (25 mg total) by mouth daily. Darrick Grinder D, NP Taking Active   torsemide (DEMADEX) 20 MG  tablet [id number] Yes Take 3 tablets (60 mg total) by mouth daily. Consuelo Pandy, PA-C Taking Active            Med Note (Deva Ron A   Tue Aug 12, 2020  2:04 PM) Taking 3 tablets one day, then 2 tablets the next day  Med List Note Elyn Peers, CPhT 04/19/16 2114): [place],  [place]            Patient Active Problem List   Diagnosis Date Noted   Mold exposure 06/17/2020   Candidiasis of penis 12/08/2018   Depression 11/06/2017   Charcot ankle, left 05/31/2017   Acute pain of left foot 05/03/2017   Diabetic polyneuropathy associated with type 2 diabetes mellitus ([place]) 03/14/2017   OSA (obstructive sleep apnea) 29/52/8413   Chronic systolic (congestive) heart failure ([place])    Morbid obesity with BMI of 50.0-59.9, adult ([place]) 10/26/2016   Asthma, chronic, mild persistent, uncomplicated 24/40/1027   Essential hypertension 10/26/2016   Diabetes mellitus type 2 in obese ([place]) 04/20/2016    Conditions to be addressed/monitored per PCP order:  CHF and DMII  Care Plan : Diabetes Type 2 (Adult)  Updates made by Melissa Montane, RN since 08/12/2020 12:00 AM     Problem: Glycemic Management (Diabetes, Type 2)      Long-Range Goal: Glycemic Management Optimized   Start Date: 08/12/2020  Expected End Date: 11/13/2020  This Visit's Progress: On track  Priority: High  Note:   Objective:  Lab Results  Component Value Date   HGBA1C >14.0 (A) 06/17/2020   Lab Results  Component Value Date   CREATININE 0.93 05/29/2020   CREATININE 0.97 05/01/2020   CREATININE 0.85 02/25/2020   No results found for: EGFR Current Barriers:  Knowledge Deficits related to basic Diabetes pathophysiology and self care/management-Jonathan [name] would like to see his A1C lower than it has been. He has started taking trulicity in hopes that it will improve his readings. He does not like to be stuck, and relies on his spouse to check his blood sugar and administer his  injections. He has a decreased appetite, some days not eating at all. Recent fasting readings were 244 and 367. He admits to being emotional and eating a lot of sugary foods the night before the 367 reading. Unable to independently administer injectable medication and monitor blood glucose Case Manager Clinical Goal(s):  patient will demonstrate improved adherence to prescribed treatment plan for diabetes self care/management as evidenced by: daily monitoring and recording of CBG,  adherence to ADA/ carb modified diet, exercise 5 days/week, adherence to prescribed medication regimen, contacting provider for new or worsened symptoms or questions Interventions:  Inter-disciplinary care team collaboration (see longitudinal plan of care) Provided education to patient about basic DM disease process Reviewed medications with patient and discussed importance of  medication adherence Discussed plans with patient for ongoing care management follow up and provided patient with direct contact information for care management team Provided patient with written educational materials related to hypo and hyperglycemia and importance of correct treatment Reviewed scheduled/upcoming provider appointments including: PCP on 6/29 Referral made to pharmacy team for assistance with medication management Review of patient status, including review of consultants reports, relevant laboratory and other test results, and medications completed. Encouraged patient to try to eat 3 small meals a day, suggested starting with 2 and work up to 3. These meals should include a lean protein. Discussed the importance of exercise and encouraged patient to take a 10 minute walk after each meal Discussed ways to divert his attention while being stuck for blood sugar checks and medication administration Self-Care Activities - Self administers oral medications as prescribed Attends all scheduled provider appointments Checks blood sugars as  prescribed and utilize hyper and hypoglycemia protocol as needed Adheres to prescribed ADA/carb modified Patient Goals: - check blood sugar at prescribed times - check blood sugar if I feel it is too high or too low - enter blood sugar readings and medication or insulin into daily log - take the blood sugar log to all doctor visits - take the blood sugar meter to all doctor visits - work on eating 3 small meals a day, including lean protein, limiting carbohydrates - increase exercise, example-take a 10 minute walk after each meal  - attend appointment with PCP 6/29 - take all medications as prescribed  Follow Up Plan: Telephone follow up appointment with care management team member scheduled for:08/28/20 @ [place] : Heart Failure (Adult)  Updates made by Melissa Montane, RN since 08/12/2020 12:00 AM     Problem: Symptom Exacerbation (Heart Failure)      Long-Range Goal: Symptom Exacerbation Prevented or Minimized   Start Date: 08/12/2020  Expected End Date: 11/13/2020  This Visit's Progress: On track  Priority: High  Note:   Current Barriers:  Knowledge deficit related to basic heart failure pathophysiology and self care management-Mr. Fitzgibbons is managing heart failure with diet and medications. He understands that he should be weighing daily, but sometimes he just forgets. He is limiting his salt intake and trying to eat heart healthy. He does not take torsemide exactly as prescribed due to side effects.  Does not adhere to prescribed medication regimen Case Manager Clinical Goal(s):  patient will weigh self daily and record patient will verbalize understanding of Heart Failure Action Plan and when to call doctor patient will take all Heart Failure mediations as prescribed patient will weigh daily and record (notifying MD of 3 lb weight gain over night or 5 lb in a week) Interventions:  Basic overview and discussion of pathophysiology of Heart Failure reviewed  Provided  verbal education on low sodium diet Provided written education on low sodium diet Assessed need for readable accurate scales in home Advised patient to weigh each morning after emptying bladder Discussed importance of daily weight and advised patient to weigh and record daily Reviewed role of diuretics in prevention of fluid overload and management of heart failure Collaborated with MM Pharmacist, Ovid Curd for medication management Patient Goals/Self-Care Activities - eat more whole grains, fruits and vegetables, lean meats and healthy fats - know when to call the doctor - track symptoms and what helps feel better or worse - weigh daily, first thing in the morning after emptying your bladder. Report weight change of 3# in  one day or 5# in a week  - take all medication as prescribed - attend appointment on 7/14 with Dr. Haroldine Laws Follow Up Plan: Telephone follow up appointment with care management team member scheduled for:08/28/20 @ 9am      Follow Up:  Patient agrees to Care Plan and Follow-up.  Plan: The Managed Medicaid care management team will reach out to the patient again over the next 14 days.  Date/time of next scheduled RN care management/care coordination outreach:  08/28/20 @ [place] RN, BSN [place]  [employer] manager

## 2020-08-13 ENCOUNTER — Ambulatory Visit (INDEPENDENT_AMBULATORY_CARE_PROVIDER_SITE_OTHER): Payer: Medicaid Other | Admitting: Pharmacist

## 2020-08-13 ENCOUNTER — Encounter: Payer: Self-pay | Admitting: Dietician

## 2020-08-13 ENCOUNTER — Ambulatory Visit (INDEPENDENT_AMBULATORY_CARE_PROVIDER_SITE_OTHER): Payer: Medicaid Other | Admitting: Internal Medicine

## 2020-08-13 ENCOUNTER — Ambulatory Visit (INDEPENDENT_AMBULATORY_CARE_PROVIDER_SITE_OTHER): Payer: Medicaid Other | Admitting: Dietician

## 2020-08-13 ENCOUNTER — Encounter: Payer: Self-pay | Admitting: Internal Medicine

## 2020-08-13 ENCOUNTER — Encounter (HOSPITAL_COMMUNITY): Payer: Self-pay | Admitting: *Deleted

## 2020-08-13 DIAGNOSIS — E1169 Type 2 diabetes mellitus with other specified complication: Secondary | ICD-10-CM

## 2020-08-13 DIAGNOSIS — E669 Obesity, unspecified: Secondary | ICD-10-CM

## 2020-08-13 DIAGNOSIS — I5022 Chronic systolic (congestive) heart failure: Secondary | ICD-10-CM | POA: Diagnosis not present

## 2020-08-13 MED ORDER — POTASSIUM CHLORIDE CRYS ER 20 MEQ PO TBCR: EXTENDED_RELEASE_TABLET | ORAL | 3 refills | Status: DC

## 2020-08-13 MED ORDER — GABAPENTIN 300 MG PO CAPS: 300.0000 mg | ORAL_CAPSULE | Freq: Three times a day (TID) | ORAL | 2 refills | Status: DC

## 2020-08-13 NOTE — Progress Notes (Signed)
  Diabetes Self-Management Education  Visit Type:    Appt. Start Time: 940 Appt. End Time: 1015  08/13/2020  Mr. [employer], identified by name and date of birth, is a 35 y.o. male with a diagnosis of Diabetes:  .Type 2 diagnosed in 2016  Last seen in 2019 for Medical Nutrition Therapy  ASSESSMENT  Weight (!) 403 lb 3.2 oz (182.9 kg). Body mass index is 51.77 kg/m.  Mr. Rote is here with his wife for help with meal planning. He states his goal is to lose 100# in 1 year. She assists with shopping and cooking. She reports cooking muffins from scratch and adding flax seeds. She uses chia seeds in their protein shakes. He is limited in his activity by diabetes complication of Charcot foot, lack of sleep due to sleep apnea/OHS (states despite Bipap he still only sleeps 4-5 hours a night due to pain in his back and legs) and congestive heart failure. He says depression is no longer a problem for him. However, he still binges/eat very large amounts of sweets at times. Otherwise, they report mostly healthy foods 2 meals a day with 1-2 snacks and water and almond malk to drink.  They are currently being referred to healthy weight and wellness program and [facility] exercise program. I encouraged them to attend and follow up with me when they are here in the office for other visits to help monitor their progress.   Lab Results  Component Value Date   HGBA1C >14.0 (A) 06/17/2020   HGBA1C 11.6 (A) 03/11/2020   HGBA1C 10.4 (A) 04/09/2019   HGBA1C 10.2 (A) 12/08/2018   HGBA1C 9.3 (A) 02/02/2018    Wt Readings from Last 10 Encounters:  08/13/20 (!) 403 lb 3.2 oz (182.9 kg)  08/13/20 (!) 403 lb 3.2 oz (182.9 kg)  07/23/20 (!) 380 lb (172.4 kg)  07/08/20 (!) 395 lb 8 oz (179.4 kg)  06/17/20 (!) 409 lb 4.8 oz (185.7 kg)  05/29/20 (!) 385 lb 9.6 oz (174.9 kg)  05/01/20 (!) 396 lb 6.4 oz (179.8 kg)  03/26/20 (!) 386 lb (175.1 kg)  03/12/20 (!) 391 lb (177.4 kg)  03/12/20 (!) 391 lb (177.4 kg)    BP Readings from Last 3 Encounters:  08/13/20 (!) 134/95  07/08/20 129/71  06/17/20 107/71     Individualized Plan for Diabetes Self-Management Training:   Learning Objective:  Patient will have a greater understanding of diabetes self-management. Patient education plan is to attend individual and/or group sessions per assessed needs and concerns.   Plan:   There are no Patient Instructions on file for this visit.  Expected Outcomes:   improved knowledge about meal planning for weight loss  Education material provided: Diabetes Resources  If problems or questions, patient to contact team via:  Phone  Future DSME appointment:

## 2020-08-13 NOTE — Progress Notes (Signed)
Subjective:    Patient ID: Jonathan [name], male    DOB: 28-Sep-1985, 35 y.o.   MRN: [id number]  HPI Patient is a 35 y.o. male who presents for diabetes management. He is in good spirits and presents with assistance of wheelchair with his wife. Patient was referred on 07/28/20 and last seen by provider, Dr. Austin Miles, on 07/08/20 and in pharmacy clinic on 08/06/20.   Patient reports taking first dose of Trulicity on Saturday (6/25) and then developing chills later on in the day that lasted until yesterday (6/28). He also reports experiencing some nausea and chest pain. He states the nausea and chest pain are still present. He denies jaw pain, numbness/tingling/pain in arms, trouble breathing, redness at injection site.  Insurance coverage/medication affordability: Medicaid   Current diabetes medications include: Januvia 100mg , has yet to take Trulicity Patient states that he is taking his diabetes medications as prescribed. Patient reports adherence with medications.   Do you feel that your medications are working for you?  yes  Have you been experiencing any side effects to the medications prescribed? Yes, trulicity.  Do you have any problems obtaining medications due to transportation or finances?  Yes, waiting on Farxiga.   Patient reports that if his wife is working (she works 02-19-2002) that he will only eat around one meal a day which is a peanut butter and jelly sandwich. If his wife is home to cook she states that she makes 3 meals a day but that it is heavy in protein and they try to incorporate vegetables. She states for a snack sometimes she will make the patient a fruit salad but that she portions everything out to make sure she doesn't cause his blood glucose to rise too high. Patient reports that he drinks mostly water and on occasion almond milk.   Patient-reported exercise habits: denies   Patient denies hypoglycemic events. Patient denies polyuria (increased urination).  Patient  denies polyphagia (increased appetite).  Patient reports polydipsia (increased thirst).  Patient reports neuropathy (nerve pain). Patient denies visual changes. Patient reports self foot exams.   Home fasting blood sugars:  2 hour post-meal/random blood sugars:   6/25: 346 6/27: 244 6/29: 253   Objective:   Labs:   Lab Results  Component Value Date   HGBA1C >14.0 (A) 06/17/2020   HGBA1C 11.6 (A) 03/11/2020   HGBA1C 10.4 (A) 04/09/2019    Lab Results  Component Value Date   MICRALBCREAT <7 12/08/2018    Lipid Panel     Component Value Date/Time   CHOL 135 10/27/2016 0221   TRIG 83 10/27/2016 0221   HDL 22 (L) 10/27/2016 0221   CHOLHDL 6.1 10/27/2016 0221   VLDL 17 10/27/2016 0221   LDLCALC 96 10/27/2016 0221    Assessment/Plan:   T2DM is not controlled. Medication adherence appears optimal. Additional pharmacotherapy is not warranted at this time. Chills and chest pain likely not associated with Trulicity use. Will discuss chest pain with PCP prior to appt they have this morning. Patient is willing to administer Trulicity again this week and see how he tolerates it. Following instruction patient verbalized understanding of treatment plan. Patient also aware to begin checking blood glucose more frequently, at least once every morning.  Continue GLP-1 dulaglutide (Trulicity) 0.75mg  once weekly.  Awaiting prior authorization approval for SGLT2-I dapagliflozin 12/27/2016) 5mg  once daily. Counseled on s/sx of and management of hypoglycemia Next A1C anticipated August 2022.   Follow-up appointment in one week with PCP/pharmacy clinic to review sugar  readings. Written patient instructions provided.  This appointment required 30 minutes of patient care (this includes precharting, chart review, review of results, and face-to-face care).  Thank you for involving pharmacy to assist in providing this patient's care.

## 2020-08-13 NOTE — Patient Instructions (Addendum)
To Mr. Beach,   It was so nice seeing you again! Today we discussed your diabetic neuropathy. We will increase your gabapentin to 300 mg three times a day. Please continue taking your diabetes medications as prescribed. We will check your A1c next month. I come back to the office in September and will follow up with you then.  Have a good day! Dolan Amen, MD

## 2020-08-13 NOTE — Progress Notes (Signed)
Pt's wife FMLA forms for intermittent leave were due to be updated, forms completed, signed by Dr Gala Romney and faxed to [employer] at [phone]

## 2020-08-13 NOTE — Patient Instructions (Signed)
Thank you for your visit today!  We talked about:   Decreasing the calories that you eat and drink to help you lose weight. You said you want to decrease your weight 100# in 12 months. That is about 2 pounds per week.   Goals to work on:   Try to [employer] and catch yourself when eating is out of control- eating large portions of sweets.   Follow up with [facility]  Be sure to get at least 30-40 grams of protein 3 times a day  Limit carbohydrates until your blood sugars are better controlled-(staying in the 100s)   Please make a follow up appointment with me on the same day you follow up  with the doctor or pharmacist.   Please feel free to call me anytime.  Lupita Leash [phone]

## 2020-08-13 NOTE — Patient Instructions (Addendum)
Mr. Lepore it was a pleasure seeing you today.   Please do the following:  Continue all your medications as directed today during your appointment. If you have any questions or if you believe something has occurred because of this change, call me or your doctor to let one of [place] know.  We are waiting on a prior authorization for your Marcelline Deist.  Continue checking blood sugars at home. It's really important that you record these and bring these in to your next doctor's appointment.  Continue making the lifestyle changes we've discussed together during our visit. Diet and exercise play a significant role in improving your blood sugars.  If you would like to use [facility] their phone number is [phone]. Follow-up with me on July 14th  Hypoglycemia or low blood sugar:   Low blood sugar can happen quickly and may become an emergency if not treated right away.   While this shouldn't happen often, it can be brought upon if you skip a meal or do not eat enough. Also, if your insulin or other diabetes medications are dosed too high, this can cause your blood sugar to go to low.   Warning signs of low blood sugar include: Feeling shaky or dizzy Feeling weak or tired  Excessive hunger Feeling anxious or upset  Sweating even when you aren't exercising  What to do if I experience low blood sugar? Follow the Rule of 15 Check your blood sugar with your meter. If lower than 70, proceed to step 2.  Treat with 15 grams of fast acting carbs which is found in 3-4 glucose tablets. If none are available you can try hard candy, 1 tablespoon of sugar or honey,4 ounces of fruit juice, or 6 ounces of REGULAR soda.  Re-check your sugar in 15 minutes. If it is still below 70, do what you did in step 2 again. If your blood sugar has come back up, go ahead and eat a snack or small meal made up of complex carbs (ex. Whole grains) and protein at this time to avoid recurrence of low blood  sugar.

## 2020-08-14 ENCOUNTER — Telehealth: Payer: Self-pay | Admitting: *Deleted

## 2020-08-14 NOTE — Assessment & Plan Note (Addendum)
Patient seen today with a recent A1c of >14. Patient was recently seen by our clinical pharmacist and confirmed that he will be taking Trulicity 0.75 mg once weekly and will also start Farxiga 5 mg daily, pending PA.  In short, his case is complicated due to having a past history of reactions to medications. He experienced yeast infections with jardiance, developed neck pain and rash with Janumet, semaglutide caused persistent nausea and heartburn, he and he is hesitant to start insulin therapy due to his joints locking up upon injection. He is additionally hesitant to start any injectable medications. - Continue Trulicity 0.75 mg weekly - We will start farxiga 5 mg daily - A1c in 4 weeks

## 2020-08-14 NOTE — Progress Notes (Signed)
   CC: Medication Refills/Diabetes Management  HPI:  [name] is a 35 y.o. person, with a PMH noted below, who presents to the clinic for diabetes management. To see the management of their acute and chronic conditions, please see the A&P note under the Encounters tab.   Past Medical History:  Diagnosis Date   Acute pain of left foot 05/03/2017   Asthma    Asthma, chronic, mild persistent, uncomplicated 10/26/2016   Charcot ankle, left 05/31/2017   CHF (congestive heart failure) (HCC)    Chronic systolic (congestive) heart failure (HCC)    Diabetes mellitus type 2 in obese (HCC) 04/21/2014   Diabetic polyneuropathy associated with type 2 diabetes mellitus (HCC) 03/14/2017   Essential hypertension 10/26/2016   Hypertension    Obesity    Obesity, Class III, BMI 40-49.9 (morbid obesity) (HCC) 10/26/2016   OSA (obstructive sleep apnea) 12/02/2016   Type 2 diabetes mellitus with diabetic neuropathy (HCC)    Review of Systems:   Review of Systems  Constitutional:  Negative for chills, diaphoresis, fever, malaise/fatigue and weight loss.  Respiratory:  Negative for cough, hemoptysis, sputum production, shortness of breath and wheezing.   Cardiovascular:  Positive for leg swelling. Negative for chest pain, palpitations, orthopnea and claudication.  Gastrointestinal:  Negative for abdominal pain, constipation, diarrhea, nausea and vomiting.  Musculoskeletal:  Negative for myalgias.  Skin:  Negative for itching and rash.  Neurological:  Negative for dizziness and headaches.    Physical Exam:  Vitals:   08/13/20 1027 08/13/20 1031  BP: (!) 132/113 (!) 134/95  Pulse: (!) 110 (!) 109  Weight: (!) 403 lb 3.2 oz (182.9 kg)    Physical Exam Constitutional:      General: He is not in acute distress.    Appearance: He is obese. He is not ill-appearing, toxic-appearing or diaphoretic.  HENT:     Head: Normocephalic and atraumatic.  Cardiovascular:     Rate and Rhythm: Regular  rhythm. Tachycardia present.     Pulses: Normal pulses.     Heart sounds: Normal heart sounds. No murmur heard.   No friction rub. No gallop.  Pulmonary:     Effort: Pulmonary effort is normal. No respiratory distress.     Breath sounds: Normal breath sounds. No wheezing or rales.  Chest:     Chest wall: No tenderness.  Abdominal:     General: Bowel sounds are normal.     Tenderness: There is no abdominal tenderness.     Comments: Distended 2/2 to habitus  Musculoskeletal:        General: Swelling and tenderness present.     Right lower leg: Edema present.     Left lower leg: Edema present.     Comments: Bilateral lower extremities edematous, tender to light touch unable to assess grade of edema 2/2 to pain  Skin:    General: Skin is warm.     Findings: No bruising, erythema, lesion or rash.  Neurological:     Mental Status: He is alert and oriented to person, place, and time.  Psychiatric:        Mood and Affect: Mood normal.        Behavior: Behavior normal.     Assessment & Plan:   See Encounters Tab for problem based charting.  Patient discussed with Dr. Mayford Knife

## 2020-08-14 NOTE — Telephone Encounter (Addendum)
Information was sent  through [facility] UHC/CoverMyMeds.  For PA for Faxiga.  Determination between 24 to 72 hours.  Angelina Ok, RN 08/14/2020 10:26 AM.   PA for Marcelline Deist was approved 08/14/2020 thru 08/14/2021.  Angelina Ok, RN 08/19/2020 10:05 AM.

## 2020-08-15 NOTE — Assessment & Plan Note (Signed)
Patient noted to have a 20 lb increase since the beginning of the month. His lower extremities are edematous and tender to palpation. He is tachycardic, but his WOB is non-labored, there are no decreased breath sounds. We discussed calling into his cardiologist. Patient states that he is going to start his torsemide today.  - Patient resuming home torsemide - Refill KLOR-CON M20 - Follow up with cardiologist appointment - Strict return precautions given.

## 2020-08-19 ENCOUNTER — Telehealth: Payer: Self-pay

## 2020-08-19 ENCOUNTER — Encounter: Payer: Self-pay | Admitting: *Deleted

## 2020-08-19 ENCOUNTER — Telehealth: Payer: Self-pay | Admitting: *Deleted

## 2020-08-19 NOTE — Telephone Encounter (Signed)
error 

## 2020-08-19 NOTE — Telephone Encounter (Signed)
Patient called in c/o abd pain, nausea, and black stool x 4 day. Has not seen any blood in stool. States these sx were somewhat present at OV with PCP on 6/29 but not bad enough to mention it. Also, notes decreased appetite. Discussed importance of staying well hydrated. Appt given tomorrow AM with [facility] Team. Patient prefers to do this via telehealth.

## 2020-08-20 ENCOUNTER — Encounter: Payer: Self-pay | Admitting: Student

## 2020-08-20 ENCOUNTER — Other Ambulatory Visit: Payer: Self-pay

## 2020-08-20 ENCOUNTER — Ambulatory Visit (INDEPENDENT_AMBULATORY_CARE_PROVIDER_SITE_OTHER): Payer: Medicaid Other | Admitting: Student

## 2020-08-20 ENCOUNTER — Telehealth: Payer: Self-pay | Admitting: Internal Medicine

## 2020-08-20 DIAGNOSIS — R1030 Lower abdominal pain, unspecified: Secondary | ICD-10-CM

## 2020-08-20 DIAGNOSIS — R109 Unspecified abdominal pain: Secondary | ICD-10-CM | POA: Insufficient documentation

## 2020-08-20 NOTE — Progress Notes (Signed)
   CC: Abdominal pain, dark stools  This is a telephone encounter between [employer] and [employer] on 08/20/2020 for abdominal pain and dark stools. The visit was conducted with the patient located at home and Thalia Bloodgood at [facility]. The patient's identity was confirmed using their DOB and current address. The patient has consented to being evaluated through a telephone encounter and understands the associated risks (an examination cannot be done and the patient may need to come in for an appointment) / benefits (allows the patient to remain at home, decreasing exposure to coronavirus). I personally spent 15 minutes on medical discussion.   HPI:  [name] is a 35 y.o. with PMH as below.   [name] states that he has been having bilateral lower quadrant abdominal pain for the past 6 days.  He states that the pain is at baseline ache but with certain activities pain is dull and sharp.  The pain has been constant at times and intermittent and others.  He notes that the pain is worse when he is passing gas.  Nothing has made the pain better.  Denies improvement with Tylenol or bowel movements.  He subsequently states that during this time he has had dark stools.  They were diarrhea initially and then they transition to a softer consistency.  Did have single episode of vomiting after eating a meal.  He notes that he has not been eating that much secondary to the pain.  He notes that he had a similar episode of bilateral lower abdominal pain a few weeks ago that resolved in 2 days.  He did not have any dark stools during this episode.  Denies history of smoking or history of GERD.  Denies having similar abdominal pain in the past.  He has felt febrile but has not taken a temperature.  Denies chest pain or difficulty breathing.His wife tested positive for COVID-19 in the past few days.  They have been attempting to quarantine/isolate in their house.   Past Medical History:  Diagnosis  Date   Acute pain of left foot 05/03/2017   Asthma    Asthma, chronic, mild persistent, uncomplicated 10/26/2016   Charcot ankle, left 05/31/2017   CHF (congestive heart failure) (HCC)    Chronic systolic (congestive) heart failure (HCC)    Diabetes mellitus type 2 in obese (HCC) 04/21/2014   Diabetic polyneuropathy associated with type 2 diabetes mellitus (HCC) 03/14/2017   Essential hypertension 10/26/2016   Hypertension    Obesity    Obesity, Class III, BMI 40-49.9 (morbid obesity) (HCC) 10/26/2016   OSA (obstructive sleep apnea) 12/02/2016   Type 2 diabetes mellitus with diabetic neuropathy (HCC)    Review of Systems: As per HPI  Assessment & Plan:   See Encounters Tab for problem based charting.  Patient discussed with Dr. Girtha Rm Internal Medicine Resident

## 2020-08-20 NOTE — Telephone Encounter (Signed)
..   Medicaid Managed Care   Unsuccessful Outreach Note  08/20/2020 Name: Jonathan [name] MRN: [id number] DOB: 04/04/1985  Referred by: Dolan Amen, MD Reason for referral : High Risk Managed Medicaid (Spoke to patient today about getting him scheduled for a telephone visit with our pharmacist and he asked for me to call him back in a couple of weeks because he has too much going on right now.)   An unsuccessful telephone outreach was attempted today. The patient was referred to the case management team for assistance with care management and care coordination.   Follow Up Plan: The care management team will reach out to the patient again over the next 14 days.   Weston Settle Care Guide, High Risk Medicaid Managed Care Embedded Care Coordination [facility]  [facility]

## 2020-08-20 NOTE — Assessment & Plan Note (Signed)
Assessment: As per HPI, [name] been having abdominal pain with associated dark tarry stools for the past 6 days.  He was initially requested to come to the clinic to have an inpatient appointment but he repeatedly denied to the nursing staff as he stated he was not feeling well.  I am quite concerned with his symptoms and believe that he needs an in person evaluation and further evaluation of his past medical history, a CBC, and CT scan.  Patient refused to come to the clinic today and would prefer to come in tomorrow morning.  Depending on this, will dictate whether or not patient needs to be admitted to the hospital or not.  We have scheduled him an appointment to be seen tomorrow morning at 845.  Instructed patient to call our clinic if he has any changes or go to the closest emergency department if he feels as though he is having an emergency.  Plan: -Patient to be evaluated in person tomorrow morning -Plan for rectal exam, CBC, CT scan. -Depending on results above, will determine whether or not patient is admitted to the hospital

## 2020-08-21 ENCOUNTER — Ambulatory Visit (INDEPENDENT_AMBULATORY_CARE_PROVIDER_SITE_OTHER): Payer: Medicaid Other | Admitting: Student

## 2020-08-21 ENCOUNTER — Encounter: Payer: Self-pay | Admitting: Student

## 2020-08-21 VITALS — BP 151/104 | HR 102 | Temp 98.3°F | Ht 74.0 in | Wt 381.9 lb

## 2020-08-21 DIAGNOSIS — R1030 Lower abdominal pain, unspecified: Secondary | ICD-10-CM

## 2020-08-21 DIAGNOSIS — R195 Other fecal abnormalities: Secondary | ICD-10-CM

## 2020-08-21 LAB — CBC
HCT: 48 % (ref 39.0–52.0)
Hemoglobin: 15.7 g/dL (ref 13.0–17.0)
MCH: 29.9 pg (ref 26.0–34.0)
MCHC: 32.7 g/dL (ref 30.0–36.0)
MCV: 91.4 fL (ref 80.0–100.0)
Platelets: 176 10*3/uL (ref 150–400)
RBC: 5.25 MIL/uL (ref 4.22–5.81)
RDW: 12.9 % (ref 11.5–15.5)
WBC: 3.8 10*3/uL — ABNORMAL LOW (ref 4.0–10.5)
nRBC: 0 % (ref 0.0–0.2)

## 2020-08-21 MED ORDER — GUAIFENESIN-CODEINE 100-10 MG/5ML PO SOLN: 5.0000 mL | Freq: Every day | ORAL | 0 refills | Status: DC

## 2020-08-21 NOTE — Patient Instructions (Addendum)
Thank you, [name] for allowing [place] to provide your care today. Today we discussed.   Abdominal pain/Dark Stools Your hemoglobin (blood count) is stable and a high number. You are not anemic and no longer having dark stools. This may be due to COVID, however, because you are still having abdominal pain and have a history of colon cancer in the family, I have ordered a CT (CAT) scan. Please have this done as soon as you can. If you the pain becomes more severe or you start having dark stools again, please call our office. If you feel as though you are having an emergency, please go to your closest emergency department.   Positive Covid Test Please continue to isolate from others. If you no longer have a fever and you haven't taken tylenol, you can leave your home on July 9th 2022 and continue to wear a mask. Please wear a well fitting mask around other people and do not travel until July 13th 2022.  After you feel better, I recommend you get your covid vaccine. If you have any questions about this, please call our office.   Please purchase over the counter robitussin DM. Take tylenol for any continued fevers.   I have ordered the following labs for you:   Lab Orders  CBC no Diff    Tests ordered today:  CT Abdomen and Pelvis   Referrals ordered today:   Referral Orders  No referral(s) requested today     I have ordered the following medication/changed the following medications:   Stop the following medications: There are no discontinued medications.   Start the following medications: No orders of the defined types were placed in this encounter.    Follow up:  2 months to follow up and see your PCP, Dr. Sande Brothers     Remember: Any worsening abdominal pain, please go to the closest emergency department.   Should you have any questions or concerns please call the internal medicine clinic at [phone].     Thalia Bloodgood, D.O. [facility]

## 2020-08-21 NOTE — Progress Notes (Signed)
CC: Abdominal pain, dark stools  HPI:  [name] is a 35 y.o. male with a past medical history stated below and presents today for abdominal pain and dark stools. Please see problem based assessment and plan for additional details.  Past Medical History:  Diagnosis Date   Acute pain of left foot 05/03/2017   Asthma    Asthma, chronic, mild persistent, uncomplicated 02/63/7858   Charcot ankle, left 05/31/2017   CHF (congestive heart failure) (HCC)    Chronic systolic (congestive) heart failure ([place])    Diabetes mellitus type 2 in obese ([place]) 04/21/2014   Diabetic polyneuropathy associated with type 2 diabetes mellitus ([place]) 03/14/2017   Essential hypertension 10/26/2016   Hypertension    Obesity    Obesity, Class III, BMI 40-49.9 (morbid obesity) ([place]) 10/26/2016   OSA (obstructive sleep apnea) 12/02/2016   Type 2 diabetes mellitus with diabetic neuropathy ([place])     Current Outpatient Medications on File Prior to Visit  Medication Sig Dispense Refill   Accu-Chek Softclix Lancets lancets Check blood sugar 3x a day 100 each 11   blood glucose meter kit and supplies KIT Dispense based on patient and insurance preference. Use up to four times daily as directed. (FOR ICD-9 250.00, 250.01). For QAC - HS accuchecks. May switch to any brand. 1 each 1   Blood Glucose Monitoring Suppl (ACCU-CHEK GUIDE) w/Device KIT Check blood sugar 3 times a day 1 kit 1   carvedilol (COREG) 25 MG tablet Take 1 tablet (25 mg total) by mouth 2 (two) times daily. 60 tablet 6   dapagliflozin propanediol (FARXIGA) 5 MG TABS tablet Take 1 tablet (5 mg total) by mouth daily. (Patient not taking: No sig reported) 30 tablet 3   digoxin (LANOXIN) 0.125 MG tablet Take 1 tablet (0.125 mg total) by mouth daily. 90 tablet 3   Dulaglutide (TRULICITY) 8.50 YD/7.4JO SOPN Inject 0.75 mg into the skin once a week. 2 mL 2   ENTRESTO 97-103 MG TAKE 1 TABLET BY MOUTH 2 (TWO) TIMES DAILY. 60 tablet 6   gabapentin  (NEURONTIN) 300 MG capsule Take 1 capsule (300 mg total) by mouth 3 (three) times daily. 90 capsule 2   glucose blood (ACCU-CHEK GUIDE) test strip Check blood sugar 3 times per day 100 each 11   metolazone (ZAROXOLYN) 2.5 MG tablet Take 1 tablet (2.5 mg total) by mouth daily as needed (1-2 times per month). As directed by HF clinic for weight gain of 3 lbs overnight or 5 lbs within one week. 10 tablet 2   potassium chloride SA (KLOR-CON M20) 20 MEQ tablet TAKE 2 TABLETS BY MOUTH AS NEEDED. WHEN YOU TAKE METOLAZONE 10 tablet 3   spironolactone (ALDACTONE) 25 MG tablet Take 1 tablet (25 mg total) by mouth daily. 90 tablet 3   torsemide (DEMADEX) 20 MG tablet Take 3 tablets (60 mg total) by mouth daily. 90 tablet 3   No current facility-administered medications on file prior to visit.    Family History  Problem Relation Age of Onset   Diabetes Mellitus II Mother    Heart failure Mother    Hypertension Mother    Heart disease Mother    Heart failure Father    Kidney disease Father    Heart disease Father    Congestive Heart Failure Neg Hx     Social History   Socioeconomic History   Marital status: Married    Spouse name: Not on file   Number of children: Not on file  Years of education: Not on file   Highest education level: Not on file  Occupational History   Not on file  Tobacco Use   Smoking status: Never   Smokeless tobacco: Never  Vaping Use   Vaping Use: Never used  Substance and Sexual Activity   Alcohol use: No   Drug use: No   Sexual activity: Never  Other Topics Concern   Not on file  Social History Narrative   Not on file   Social Determinants of Health   Financial Resource Strain: Medium Risk   Difficulty of Paying Living Expenses: Somewhat hard  Food Insecurity: No Food Insecurity   Worried About Running Out of Food in the Last Year: Never true   Ran Out of Food in the Last Year: Never true  Transportation Needs: No Transportation Needs   Lack of  Transportation (Medical): No   Lack of Transportation (Non-Medical): No  Physical Activity: Not on file  Stress: Not on file  Social Connections: Not on file  Intimate Partner Violence: Not on file    Review of Systems: ROS negative except for what is noted on the assessment and plan.  Vitals:   08/21/20 0911  BP: (!) 151/104  Pulse: (!) 102  Temp: 98.3 F (36.8 C)  TempSrc: Oral  SpO2: 97%  Weight: (!) 381 lb 14.4 oz (173.2 kg)  Height: _0  (1.88 m)   Physical Exam: Constitutional: Comfortable appearing male, resting in chair comfortably HENT: normocephalic atraumatic Eyes: conjunctiva non-erythematous Neck: supple Cardiovascular: regular rate and rhythm, no m/r/g Pulmonary/Chest: normal work of breathing on room air, lungs clear to auscultation bilaterally Abdominal: Soft, nondistended, obese abdomen.  Normal bowel sounds.  Right lower quadrant tender to palpate, left lower quadrant tender to palpate.  No guarding.  Right lower quadrant rebound tenderness present. MSK: normal bulk and tone Rectal: External, no hemorrhoids or masses present. DRE Exam, no palpable nodules, regular surface. No bright red blood seen.  Neurological: alert & oriented x 3 Skin: warm and dry Psych: Normal mood and thought process  Chaperone was present for my rectal exam  Assessment & Plan:   See Encounters Tab for problem based charting.  Patient discussed with Dr. Fanny Bien, D.O. [facility], PGY-2 Pager: [phone], Phone: [phone] Date 08/21/2020 Time 11:03 AM

## 2020-08-21 NOTE — Assessment & Plan Note (Signed)
Assessment: Jonathan [name] has been dealing with lower abdominal pain and dark tarry stools for the past 6 days. Him and I had a televisit yesterday as he did not feel well enough to come into the clinic. However, due to the concern of possible GI bleeding, he was scheduled to be seen today. He continues to endorse lower abdominal pain. He had a BM this morning that was greenish in color. He also tested positive for COVID after finding out that his spouse was positive. He was noted to have a fever of 101 5 days ago per his wife.   Upon further questioning, patient believes his maternal grandfather had colon cancer and his mother is a survivor of breast/ovarian cancer. He is unsure of their ages, I stressed the importance of finding these details out and calling our clinic back with the answers as he may be a candidate to start earlier screening for colon cancer.   On physical exam, he is mostly tender to palpate in the RLQ, with some tenderness in the LLQ. I have a low suspicion for acute abdomen. Rectal exam was unremarkable. FOBT was not performed as I did not believe it would change my management. CBC was >15 g/dL and is reassuring he is not having severe GI bleeding. I do not believe patient needs to be admitted for further evaluation, nor do I believe an emergent CT scan is needed. The symptoms aligned with him being exposed to COVID and he stools may have been dark green in color rather than melanotic. However, with his family history of malignancy,  will order CT A/P w and w/o contrast to be performed within the next week or so. I do not suspect his sx are secondary to an acute abdomen, acute appendicitis, or obstruction. DDx also includes IBD. Patient given strict return precautions.    Plan: - CT A/P w and w/o C ordered - Patient instructed to call clinic if no improvement of his symptoms

## 2020-08-26 ENCOUNTER — Telehealth: Payer: Self-pay | Admitting: Cardiology

## 2020-08-26 NOTE — Telephone Encounter (Signed)
What problem are you experiencing?  Jonathan [name] is calling to inform the office he has not been using his Bipap machine recently due to having Covid. Just wanted to make the office aware.   Who is your medical equipment company? [employer]    Please route to the sleep study assistant.

## 2020-08-28 ENCOUNTER — Other Ambulatory Visit: Payer: Self-pay | Admitting: *Deleted

## 2020-08-28 ENCOUNTER — Ambulatory Visit: Payer: Medicaid Other | Admitting: Dietician

## 2020-08-28 ENCOUNTER — Other Ambulatory Visit: Payer: Self-pay

## 2020-08-28 ENCOUNTER — Encounter: Payer: Medicaid Other | Admitting: Pharmacist

## 2020-08-28 ENCOUNTER — Encounter (HOSPITAL_COMMUNITY): Payer: Medicaid Other | Admitting: Internal Medicine

## 2020-08-28 NOTE — Progress Notes (Signed)
Internal Medicine Clinic Attending  Case discussed with Dr. Katsadouros  At the time of the visit.  We reviewed the resident's history and exam and pertinent patient test results.  I agree with the assessment, diagnosis, and plan of care documented in the resident's note.  

## 2020-08-28 NOTE — Progress Notes (Deleted)
Subjective:    Patient ID: Jonathan [name], male    DOB: 28-Sep-1985, 35 y.o.   MRN: [id number]  HPI Patient is a 35 y.o. male who presents for diabetes management. He is in good spirits and presents with assistance of wheelchair with his wife. Patient was referred on 07/28/20 and last seen by provider, Dr. Austin Miles, on 07/08/20 and in pharmacy clinic on 08/06/20.   Patient reports taking first dose of Trulicity on Saturday (6/25) and then developing chills later on in the day that lasted until yesterday (6/28). He also reports experiencing some nausea and chest pain. He states the nausea and chest pain are still present. He denies jaw pain, numbness/tingling/pain in arms, trouble breathing, redness at injection site.  Insurance coverage/medication affordability: Medicaid   Current diabetes medications include: Januvia 100mg , has yet to take Trulicity Patient states that he is taking his diabetes medications as prescribed. Patient reports adherence with medications.   Do you feel that your medications are working for you?  yes  Have you been experiencing any side effects to the medications prescribed? Yes, trulicity.  Do you have any problems obtaining medications due to transportation or finances?  Yes, waiting on Farxiga.   Patient reports that if his wife is working (she works 02-19-2002) that he will only eat around one meal a day which is a peanut butter and jelly sandwich. If his wife is home to cook she states that she makes 3 meals a day but that it is heavy in protein and they try to incorporate vegetables. She states for a snack sometimes she will make the patient a fruit salad but that she portions everything out to make sure she doesn't cause his blood glucose to rise too high. Patient reports that he drinks mostly water and on occasion almond milk.   Patient-reported exercise habits: denies   Patient denies hypoglycemic events. Patient denies polyuria (increased urination).  Patient  denies polyphagia (increased appetite).  Patient reports polydipsia (increased thirst).  Patient reports neuropathy (nerve pain). Patient denies visual changes. Patient reports self foot exams.   Home fasting blood sugars:  2 hour post-meal/random blood sugars:   6/25: 346 6/27: 244 6/29: 253   Objective:   Labs:   Lab Results  Component Value Date   HGBA1C >14.0 (A) 06/17/2020   HGBA1C 11.6 (A) 03/11/2020   HGBA1C 10.4 (A) 04/09/2019    Lab Results  Component Value Date   MICRALBCREAT <7 12/08/2018    Lipid Panel     Component Value Date/Time   CHOL 135 10/27/2016 0221   TRIG 83 10/27/2016 0221   HDL 22 (L) 10/27/2016 0221   CHOLHDL 6.1 10/27/2016 0221   VLDL 17 10/27/2016 0221   LDLCALC 96 10/27/2016 0221    Assessment/Plan:   T2DM is not controlled. Medication adherence appears optimal. Additional pharmacotherapy is not warranted at this time. Chills and chest pain likely not associated with Trulicity use. Will discuss chest pain with PCP prior to appt they have this morning. Patient is willing to administer Trulicity again this week and see how he tolerates it. Following instruction patient verbalized understanding of treatment plan. Patient also aware to begin checking blood glucose more frequently, at least once every morning.  Continue GLP-1 dulaglutide (Trulicity) 0.75mg  once weekly.  Awaiting prior authorization approval for SGLT2-I dapagliflozin 12/27/2016) 5mg  once daily. Counseled on s/sx of and management of hypoglycemia Next A1C anticipated August 2022.   Follow-up appointment in one week with PCP/pharmacy clinic to review sugar  readings. Written patient instructions provided.  This appointment required 30 minutes of patient care (this includes precharting, chart review, review of results, and face-to-face care).  Thank you for involving pharmacy to assist in providing this patient's care.   

## 2020-08-28 NOTE — Patient Outreach (Signed)
Care Coordination  08/28/2020  Jonathan [name] [birth year] [id number]  Successful outreach today with [name]. However, he was not feeling well today and requested to reschedule this telephone visit. A new appointment was made for July 27 @ 9am. Patient agreed to new date and time.  [name] RN, BSN [place]  [facility]

## 2020-08-28 NOTE — Progress Notes (Signed)
Internal Medicine Clinic Attending  Case discussed with Dr. Katsadouros  At the time of the visit.  We reviewed the resident's history and pertinent patient test results.  I agree with the assessment, diagnosis, and plan of care documented in the resident's note.  

## 2020-08-30 ENCOUNTER — Other Ambulatory Visit: Payer: Self-pay | Admitting: Student in an Organized Health Care Education/Training Program

## 2020-08-30 DIAGNOSIS — E1169 Type 2 diabetes mellitus with other specified complication: Secondary | ICD-10-CM

## 2020-09-03 ENCOUNTER — Telehealth: Payer: Self-pay | Admitting: Internal Medicine

## 2020-09-03 NOTE — Telephone Encounter (Signed)
..   Medicaid Managed Care   Unsuccessful Outreach Note  09/03/2020 Name: Jonathan [name] MRN: [id number] DOB: 1985/11/29  Referred by: Dolan Amen, MD Reason for referral : High Risk Managed Medicaid (Attempted to reach patient today to get him scheduled with the [facility] Pharmacist. Someone answered the phone and then it was disconnected.)   A second unsuccessful telephone outreach was attempted today. The patient was referred to the case management team for assistance with care management and care coordination.   Follow Up Plan: The care management team will reach out to the patient again over the next 7-14 days.   Weston Settle Care Guide, High Risk Medicaid Managed Care Embedded Care Coordination [facility]  [facility]

## 2020-09-04 NOTE — Telephone Encounter (Signed)
Called patient to encourage him to call his dme at [phone] and inform them about his compliance but the voicemail was full and I could not leave a message.

## 2020-09-05 ENCOUNTER — Other Ambulatory Visit: Payer: Self-pay

## 2020-09-05 ENCOUNTER — Encounter: Payer: Self-pay | Admitting: Allergy

## 2020-09-05 ENCOUNTER — Ambulatory Visit (INDEPENDENT_AMBULATORY_CARE_PROVIDER_SITE_OTHER): Payer: Medicaid Other | Admitting: Allergy

## 2020-09-05 VITALS — BP 130/82 | HR 114 | Temp 96.4°F | Resp 16 | Ht 74.0 in | Wt 385.8 lb

## 2020-09-05 DIAGNOSIS — L2089 Other atopic dermatitis: Secondary | ICD-10-CM

## 2020-09-05 DIAGNOSIS — H1013 Acute atopic conjunctivitis, bilateral: Secondary | ICD-10-CM | POA: Diagnosis not present

## 2020-09-05 DIAGNOSIS — T781XXD Other adverse food reactions, not elsewhere classified, subsequent encounter: Secondary | ICD-10-CM

## 2020-09-05 DIAGNOSIS — J3089 Other allergic rhinitis: Secondary | ICD-10-CM

## 2020-09-05 DIAGNOSIS — J4520 Mild intermittent asthma, uncomplicated: Secondary | ICD-10-CM

## 2020-09-05 MED ORDER — OLOPATADINE HCL 0.2 % OP SOLN: 1.0000 [drp] | Freq: Every day | OPHTHALMIC | 5 refills | Status: DC | PRN

## 2020-09-05 MED ORDER — FLUTICASONE PROPIONATE 50 MCG/ACT NA SUSP: NASAL | 3 refills | Status: DC

## 2020-09-05 MED ORDER — AZELASTINE HCL 0.1 % NA SOLN: 2.0000 | Freq: Two times a day (BID) | NASAL | 5 refills | Status: DC

## 2020-09-05 MED ORDER — LEVOCETIRIZINE DIHYDROCHLORIDE 5 MG PO TABS: 5.0000 mg | ORAL_TABLET | Freq: Every evening | ORAL | 5 refills | Status: DC

## 2020-09-05 MED ORDER — ALBUTEROL SULFATE HFA 108 (90 BASE) MCG/ACT IN AERS: 2.0000 | INHALATION_SPRAY | RESPIRATORY_TRACT | 1 refills | Status: DC | PRN

## 2020-09-05 NOTE — Progress Notes (Signed)
New Patient Note  RE: Adante Courington MRN: [id number] DOB: 06-Jan-1986 Date of Office Visit: 09/05/2020  Referring provider: Angelica Pou, MD Primary care provider: [name], MD  Chief Complaint: Allergy symptoms  History of present illness: Jonathan [name] is a 35 y.o. male presenting today for consultation for allergy.  He states when the [facility] runs in his apartment he develops allergy symptoms.  He states his eyes start to water and throat starts to itch.  He also reports nasal congestion and drainage.  He also does note headaches as well.  He states he has been itching a lot. He states there is mold in his apartment.  He has told his landlord but nothing has been done about it.  He states even doing winter he was having allergy symptoms when the [facility] wasn't running but not as bad.  He has used benadryl that he feels doesn't help much.  He did get a portable AC that he is using now that helps some in reducing symptoms.   He guesses he has eczema and currently does not have any ointments.  He has areas on his legs that get itchy and patchy.  He lotions with a moisturizer containing tea tree oil.   He reports after ingestion of mushrooms and coconuts he has generalized itching that develops immediate with ingestion.  He reports this has been happening since childhood.  Thus he avoids these foods.  He has history of childhood asthma.  He states he has not had any issues with his asthma in several years and thus does not have any inhalers at this time.  Review of systems: Review of Systems  Constitutional: Negative.   HENT:         See HPI  Eyes: Negative.   Respiratory: Negative.    Cardiovascular: Negative.   Gastrointestinal: Negative.   Musculoskeletal: Negative.   Skin:  Positive for itching and rash.  Neurological: Negative.    All other systems negative unless noted above in HPI  Past medical history: Past Medical History:  Diagnosis Date   Acute pain of left foot  05/03/2017   Asthma    Asthma, chronic, mild persistent, uncomplicated 72/53/6644   Charcot ankle, left 05/31/2017   CHF (congestive heart failure) (HCC)    Chronic systolic (congestive) heart failure ([place])    Diabetes mellitus type 2 in obese ([place]) 04/21/2014   Diabetic polyneuropathy associated with type 2 diabetes mellitus ([place]) 03/14/2017   Essential hypertension 10/26/2016   Hypertension    Obesity    Obesity, Class III, BMI 40-49.9 (morbid obesity) ([place]) 10/26/2016   OSA (obstructive sleep apnea) 12/02/2016   Type 2 diabetes mellitus with diabetic neuropathy (HCC)     Past surgical history: Past Surgical History:  Procedure Laterality Date   CARDIAC CATHETERIZATION     FOOT SURGERY     arch surgery    Family history:  Family History  Problem Relation Age of Onset   Diabetes Mellitus II Mother    Heart failure Mother    Hypertension Mother    Heart disease Mother    Heart failure Father    Kidney disease Father    Heart disease Father    Congestive Heart Failure Neg Hx     Social history: Lives in a condo with carpeting with concrete heating and central cooling.  Dog in the home.  There is concern for water damage, mildew in the home.  There is no concern for roaches in the home.  He does not report an occupation at this time.  Does not report a smoking or vaping history.   Medication List: Current Outpatient Medications  Medication Sig Dispense Refill   Accu-Chek Softclix Lancets lancets Check blood sugar 3x a day 100 each 11   blood glucose meter kit and supplies KIT Dispense based on patient and insurance preference. Use up to four times daily as directed. (FOR ICD-9 250.00, 250.01). For QAC - HS accuchecks. May switch to any brand. 1 each 1   Blood Glucose Monitoring Suppl (ACCU-CHEK GUIDE) w/Device KIT Check blood sugar 3 times a day 1 kit 1   carvedilol (COREG) 25 MG tablet Take 1 tablet (25 mg total) by mouth 2 (two) times daily. 60 tablet 6   dapagliflozin  propanediol (FARXIGA) 5 MG TABS tablet Take 1 tablet (5 mg total) by mouth daily. 30 tablet 3   digoxin (LANOXIN) 0.125 MG tablet Take 1 tablet (0.125 mg total) by mouth daily. 90 tablet 3   Dulaglutide (TRULICITY) 1.63 WG/6.6ZL SOPN Inject 0.75 mg into the skin once a week. 2 mL 2   ENTRESTO 97-103 MG TAKE 1 TABLET BY MOUTH 2 (TWO) TIMES DAILY. 60 tablet 6   gabapentin (NEURONTIN) 300 MG capsule Take 1 capsule (300 mg total) by mouth 3 (three) times daily. 90 capsule 2   glucose blood (ACCU-CHEK GUIDE) test strip Check blood sugar 3 times per day 100 each 11   guaiFENesin-codeine 100-10 MG/5ML syrup Take 5 mLs by mouth at bedtime. This will make you sleepy, do not take and drive 935 mL 0   metolazone (ZAROXOLYN) 2.5 MG tablet Take 1 tablet (2.5 mg total) by mouth daily as needed (1-2 times per month). As directed by HF clinic for weight gain of 3 lbs overnight or 5 lbs within one week. 10 tablet 2   potassium chloride SA (KLOR-CON M20) 20 MEQ tablet TAKE 2 TABLETS BY MOUTH AS NEEDED. WHEN YOU TAKE METOLAZONE 10 tablet 3   spironolactone (ALDACTONE) 25 MG tablet Take 1 tablet (25 mg total) by mouth daily. 90 tablet 3   torsemide (DEMADEX) 20 MG tablet Take 3 tablets (60 mg total) by mouth daily. 90 tablet 3   No current facility-administered medications for this visit.    Known medication allergies: Allergies  Allergen Reactions   Hydrocodone     Hives      Physical examination: Blood pressure 130/82, pulse (!) 114, temperature (!) 96.4 F (35.8 C), resp. rate 16, height 6' 2"  (1.88 m), weight (!) 385 lb 12.8 oz (175 kg), SpO2 96 %.  General: Alert, interactive, in no acute distress, obese. HEENT: PERRLA, TMs pearly gray, turbinates minimally edematous without discharge, post-pharynx non erythematous. Neck: Supple without lymphadenopathy. Lungs: Clear to auscultation without wheezing, rhonchi or rales. {no increased work of breathing. CV: Normal S1, S2 without murmurs. Abdomen:  Nondistended, nontender. Skin: Dry, patchy, with minimal scaling of shins of lower extremities . Extremities:  No clubbing, cyanosis or edema. Neuro:   Grossly intact.  Diagnositics/Labs:  Allergy testing: Environmental allergy skin prick testing is negative with a positive histamine control Intradermal testing is positive to mold mix 1 and 2 [employer] food allergy skin prick testing is negative to mushroom and coconut  Allergy testing results were read and interpreted by provider, documented by clinical staff.   Assessment and plan: Allergic rhinitis with conjunctivitis  -Environmental allergy testing is positive to mold -Allergen avoidance measures discussed/handouts provided -For allergy symptom control recommend the following    -  Long-acting antihistamine like Zyrtec 10 mg, Allegra 180 mg or Xyzal 5 mg daily as needed    -For nasal congestion recommend a nasal steroid spray like Flonase 2 sprays each nostril daily for 1-2 weeks at a time before stopping once nasal congestion improves for maximum benefit    -For nasal drainage recommend a nasal antihistamine like Astelin 2 sprays each nostril twice a day as needed    -For itchy watery eyes recommend use of olopatadine 0.2% 1 drop each eye daily as needed -Allergen immunotherapy discussed today including protocol, benefits and risk.  Informational handout provided.  If interested in this therapuetic option you can check with your insurance carrier for coverage.  Let us know if you would like to proceed with this option.    History of asthma, mild intermittent -Have access to albuterol inhaler 2 puffs every 4-6 hours as needed for cough/wheeze/shortness of breath/chest tightness.  May use 15-20 minutes prior to activity.   Monitor frequency of use.    Eczema -Keep skin moisturized especially after bathing -Can use over-the-counter hydrocortisone as needed for itchy, dry, patchy, scaly or flaky areas  Adverse food reaction -Skin testing  to mushroom and coconut are negative -If interested in putting these foods back in the diet let us know  Follow-up in 4 months or sooner if needed  I appreciate the opportunity to take part in Jaimes's care. Please do not hesitate to contact me with questions.  Sincerely,   Prudy Feeler, MD Allergy/Immunology [facility] [place] [facility]

## 2020-09-05 NOTE — Patient Instructions (Addendum)
-  Environmental allergy testing is positive to mold -Allergen avoidance measures discussed/handouts provided -For allergy symptom control recommend the following    -Long-acting antihistamine like Zyrtec 10 mg, Allegra 180 mg or Xyzal 5 mg daily as needed    -For nasal congestion recommend a nasal steroid spray like Flonase 2 sprays each nostril daily for 1-2 weeks at a time before stopping once nasal congestion improves for maximum benefit    -For nasal drainage recommend a nasal antihistamine like Astelin 2 sprays each nostril twice a day as needed    -For itchy watery eyes recommend use of olopatadine 0.2% 1 drop each eye daily as needed -Allergen immunotherapy discussed today including protocol, benefits and risk.  Informational handout provided.  If interested in this therapuetic option you can check with your insurance carrier for coverage.  Let us know if you would like to proceed with this option.    -Have access to albuterol inhaler 2 puffs every 4-6 hours as needed for cough/wheeze/shortness of breath/chest tightness.  May use 15-20 minutes prior to activity.   Monitor frequency of use.    -Keep skin moisturized especially after bathing -Can use over-the-counter hydrocortisone as needed for itchy, dry, patchy, scaly or flaky areas  -Skin testing to mushroom and coconut are negative -If interested in putting these foods back in the diet let us know  Follow-up in 4 months or sooner if needed

## 2020-09-06 ENCOUNTER — Other Ambulatory Visit (HOSPITAL_COMMUNITY): Payer: Self-pay | Admitting: Cardiology

## 2020-09-10 ENCOUNTER — Other Ambulatory Visit: Payer: Self-pay | Admitting: *Deleted

## 2020-09-10 ENCOUNTER — Other Ambulatory Visit: Payer: Self-pay

## 2020-09-10 ENCOUNTER — Telehealth: Payer: Self-pay | Admitting: Internal Medicine

## 2020-09-10 NOTE — Patient Outreach (Signed)
Care Coordination  09/10/2020  Jonathan [name] 1985/07/17 [id number]  Successful outreach with Jonathan [name] today. However, he was not feeling well and requested to reschedule. RNCM reminded him of his appointment with PCP on 7/28. Advised patient to call the office to notify them of his symptoms and how he is feeling. A new telephone appointment was scheduled for 10/06/20 @ 9am. Patient voiced understanding.  [name] RN, BSN   [facility]

## 2020-09-10 NOTE — Telephone Encounter (Signed)
  Medicaid Managed Care   Unsuccessful Outreach Note  09/10/2020 Name: Jonathan [name] MRN: [id number] DOB: 11-12-1985  Referred by: Dolan Amen, MD Reason for referral : High Risk Managed Medicaid (Attempted to reach patient today to get him scheduled for a phone visit with the [facility] pharmacist. He did not answer and his VM was full.)   Third unsuccessful telephone outreach was attempted today. The patient was referred to the case management team for assistance with care management and care coordination. The patient's primary care provider has been notified of our unsuccessful attempts to make or maintain contact with the patient. The care management team is pleased to engage with this patient at any time in the future should he/she be interested in assistance from the care management team.   Follow Up Plan: We have been unable to make contact with the patient for follow up. The care management team is available to follow up with the patient after provider conversation with the patient regarding recommendation for care management engagement and subsequent re-referral to the care management team.   Weston Settle Care Guide, High Risk Medicaid Managed Care Embedded Care Coordination [facility]  [facility]

## 2020-09-11 ENCOUNTER — Ambulatory Visit: Payer: Medicaid Other | Admitting: Dietician

## 2020-09-11 ENCOUNTER — Other Ambulatory Visit: Payer: Self-pay

## 2020-09-11 ENCOUNTER — Encounter (HOSPITAL_COMMUNITY): Payer: Self-pay

## 2020-09-11 ENCOUNTER — Telehealth: Payer: Medicaid Other | Admitting: Pharmacist

## 2020-09-11 ENCOUNTER — Emergency Department (HOSPITAL_COMMUNITY): Payer: Medicaid Other

## 2020-09-11 ENCOUNTER — Emergency Department (HOSPITAL_COMMUNITY)
Admission: EM | Admit: 2020-09-11 | Discharge: 2020-09-11 | Disposition: A | Discharge status: Home / Self Care | Payer: Medicaid Other | Attending: Emergency Medicine | Admitting: Emergency Medicine

## 2020-09-11 DIAGNOSIS — R600 Localized edema: Secondary | ICD-10-CM | POA: Insufficient documentation

## 2020-09-11 DIAGNOSIS — J4530 Mild persistent asthma, uncomplicated: Secondary | ICD-10-CM | POA: Insufficient documentation

## 2020-09-11 DIAGNOSIS — I110 Hypertensive heart disease with heart failure: Secondary | ICD-10-CM | POA: Diagnosis not present

## 2020-09-11 DIAGNOSIS — Z794 Long term (current) use of insulin: Secondary | ICD-10-CM | POA: Diagnosis not present

## 2020-09-11 DIAGNOSIS — Z7951 Long term (current) use of inhaled steroids: Secondary | ICD-10-CM | POA: Insufficient documentation

## 2020-09-11 DIAGNOSIS — R55 Syncope and collapse: Secondary | ICD-10-CM | POA: Diagnosis not present

## 2020-09-11 DIAGNOSIS — I5022 Chronic systolic (congestive) heart failure: Secondary | ICD-10-CM | POA: Insufficient documentation

## 2020-09-11 DIAGNOSIS — I517 Cardiomegaly: Secondary | ICD-10-CM | POA: Diagnosis not present

## 2020-09-11 DIAGNOSIS — R059 Cough, unspecified: Secondary | ICD-10-CM | POA: Diagnosis not present

## 2020-09-11 DIAGNOSIS — E1142 Type 2 diabetes mellitus with diabetic polyneuropathy: Secondary | ICD-10-CM | POA: Diagnosis not present

## 2020-09-11 DIAGNOSIS — R42 Dizziness and giddiness: Secondary | ICD-10-CM | POA: Insufficient documentation

## 2020-09-11 DIAGNOSIS — R231 Pallor: Secondary | ICD-10-CM | POA: Diagnosis not present

## 2020-09-11 DIAGNOSIS — R739 Hyperglycemia, unspecified: Secondary | ICD-10-CM | POA: Diagnosis not present

## 2020-09-11 DIAGNOSIS — W19XXXA Unspecified fall, initial encounter: Secondary | ICD-10-CM | POA: Diagnosis not present

## 2020-09-11 LAB — URINALYSIS, ROUTINE W REFLEX MICROSCOPIC
Bacteria, UA: NONE SEEN
Bilirubin Urine: NEGATIVE
Glucose, UA: >=500 mg/dL — AB
Hgb urine dipstick: NEGATIVE
Ketones, ur: NEGATIVE mg/dL
Leukocytes,Ua: NEGATIVE
Nitrite: NEGATIVE
Protein, ur: 100 mg/dL — AB
Specific Gravity, Urine: 1.02 (ref 1.005–1.030)
pH: 5 (ref 5.0–8.0)

## 2020-09-11 LAB — BASIC METABOLIC PANEL
Anion gap: 13 (ref 5–15)
BUN: 29 mg/dL — ABNORMAL HIGH (ref 6–20)
CO2: 26 mmol/L (ref 22–32)
Calcium: 8.9 mg/dL (ref 8.9–10.3)
Chloride: 88 mmol/L — ABNORMAL LOW (ref 98–111)
Creatinine, Ser: 1.56 mg/dL — ABNORMAL HIGH (ref 0.61–1.24)
GFR, Estimated: 59 mL/min — ABNORMAL LOW (ref >60)
Glucose, Bld: 366 mg/dL — ABNORMAL HIGH (ref 70–99)
Potassium: 5 mmol/L (ref 3.5–5.1)
Sodium: 127 mmol/L — ABNORMAL LOW (ref 135–145)

## 2020-09-11 LAB — CBC
HCT: 46.3 % (ref 39.0–52.0)
Hemoglobin: 15.2 g/dL (ref 13.0–17.0)
MCH: 30.6 pg (ref 26.0–34.0)
MCHC: 32.8 g/dL (ref 30.0–36.0)
MCV: 93.2 fL (ref 80.0–100.0)
Platelets: 216 10*3/uL (ref 150–400)
RBC: 4.97 MIL/uL (ref 4.22–5.81)
RDW: 13.9 % (ref 11.5–15.5)
WBC: 7.7 10*3/uL (ref 4.0–10.5)
nRBC: 0 % (ref 0.0–0.2)

## 2020-09-11 LAB — CBG MONITORING, ED: Glucose-Capillary: 348 mg/dL — ABNORMAL HIGH (ref 70–99)

## 2020-09-11 LAB — BRAIN NATRIURETIC PEPTIDE: B Natriuretic Peptide: 803.3 pg/mL — ABNORMAL HIGH (ref 0.0–100.0)

## 2020-09-11 MED ORDER — ONDANSETRON 4 MG PO TBDP: 4.0000 mg | ORAL_TABLET | Freq: Three times a day (TID) | ORAL | 0 refills | Status: DC | PRN

## 2020-09-11 NOTE — ED Triage Notes (Signed)
Pt BIB [facility] from home, called out for acute onset of dizziness and episode of near syncope. Pt reports having mold in his apartment, recently detected 6 months ago, ongoing respiratory and CHF since mold detected. Pt with a nonproductive cough and increase SOB on exertion.   BP 100/76 HR 93 RR 18-20 98% RA   CBG 357

## 2020-09-11 NOTE — ED Notes (Addendum)
Spouse up to nursing station wanting to know how much longer it was going to be. She is refusing to allow [place] to collect more blood for new orders. Spouse states, "Oh no we know his heart is damaged we're not giving anymore blood. He got plenty when we got here (pointing at [facility]), we're not trying to donate blood today. We just need to go home. Dr. Jodi Mourning made aware and at bedside. Plan is to d/c home at this time.

## 2020-09-11 NOTE — Discharge Instructions (Addendum)
It was a pleasure caring for you! You came in for dizziness and feeling like you are gong to pass out.  Your blood pressure was low, and your blood work showed acute renal failure all which could be contributing to your dizziness. While we recommend you staying to finish your work up, you are declining admission and prefer to go home.  Please make sure you follow-up with cardiology, they should notify you about an appointment, but if you do not hear from them call the office.  Dr. Shirlee Latch recommended holding the Entresto, and spironolactone due to low blood pressure, and to hold digoxin if the level is greater than 1.  I have prescribed Zofran for your nausea, but return to the hospital if your dizziness worsens, you develop chest pain, difficulty breathing, or have any new and concerning symptoms

## 2020-09-11 NOTE — ED Provider Notes (Signed)
[place] EMERGENCY DEPARTMENT Provider Note   CSN: [id number] Arrival date & time: 09/11/20  0900     History Chief Complaint  Jonathan [name] presents with   Near Syncope    Jonathan [name] is a 35 y.o. male with PMH of HFrEF, HTN, DM2, asthma, OSA, who presents for dizziness and a near syncopal episode. States mold in house makes him dizzy and nauseous. Today states Jonathan [name] got dizzy at home and fell. Fell on right side today. Denies hitting head or losing consciousness. States that inside the house dizziness worsens, sometimes also occurs outside the house. Endorses having to use his inhaler more- uses albuterol twice a day. Denies CP, vomiting, endorses SOB worse with exertion, has noticed increased swelling. Does have cough, denies fever. Jonathan [name] reports Jonathan [name]'s baseline weight is about 378-382 lbs. Medications on chart verified, Jonathan [name] denies taking his meds this morning    Past Medical History:  Diagnosis Date   Acute pain of left foot 05/03/2017   Asthma    Asthma, chronic, mild persistent, uncomplicated 09/32/3557   Charcot ankle, left 05/31/2017   CHF (congestive heart failure) (HCC)    Chronic systolic (congestive) heart failure ([place])    Diabetes mellitus type 2 in obese ([place]) 04/21/2014   Diabetic polyneuropathy associated with type 2 diabetes mellitus ([place]) 03/14/2017   Essential hypertension 10/26/2016   Hypertension    Obesity    Obesity, Class III, BMI 40-49.9 (morbid obesity) ([place]) 10/26/2016   OSA (obstructive sleep apnea) 12/02/2016   Type 2 diabetes mellitus with diabetic neuropathy [facility])     Jonathan [name] Active Problem List   Diagnosis Date Noted   Abdominal pain 08/20/2020   Mold exposure 06/17/2020   Depression 11/06/2017   Charcot ankle, left 05/31/2017   Acute pain of left foot 05/03/2017   Diabetic polyneuropathy associated with type 2 diabetes mellitus ([place]) 03/14/2017   OSA (obstructive sleep apnea) 32/20/2542   Chronic systolic (congestive)  heart failure ([place])    Morbid obesity with BMI of 50.0-59.9, adult ([place]) 10/26/2016   Asthma, chronic, mild persistent, uncomplicated 70/62/3762   Essential hypertension 10/26/2016   Diabetes mellitus type 2 in obese ([place]) 04/20/2016    Past Surgical History:  Procedure Laterality Date   CARDIAC CATHETERIZATION     FOOT SURGERY     arch surgery       Family History  Problem Relation Age of Onset   Diabetes Mellitus II Mother    Heart failure Mother    Hypertension Mother    Heart disease Mother    Heart failure Father    Kidney disease Father    Heart disease Father    Congestive Heart Failure Neg Hx     Social History   Tobacco Use   Smoking status: Never   Smokeless tobacco: Never  Vaping Use   Vaping Use: Never used  Substance Use Topics   Alcohol use: No   Drug use: No    Home Medications Prior to Admission medications   Medication Sig Start Date End Date Taking? Authorizing Provider  Accu-Chek Softclix Lancets lancets Check blood sugar 3x a day 06/23/20   Asencion Noble, MD  albuterol (VENTOLIN HFA) 108 (90 Base) MCG/ACT inhaler Inhale 2 puffs into the lungs every 4 (four) hours as needed for wheezing or shortness of breath. 09/05/20   Padgett, Rae Halsted, MD  azelastine (ASTELIN) 0.1 % nasal spray Place 2 sprays into both nostrils 2 (two) times daily. Use in each nostril as directed 09/05/20  Kennith Gain, MD  blood glucose meter kit and supplies KIT Dispense based on Jonathan [name] and insurance preference. Use up to four times daily as directed. (FOR ICD-9 250.00, 250.01). For QAC - HS accuchecks. May switch to any brand. 10/29/16   Thurnell Lose, MD  Blood Glucose Monitoring Suppl (ACCU-CHEK GUIDE) w/Device KIT Check blood sugar 3 times a day 06/23/20   [name], MD  carvedilol (COREG) 25 MG tablet Take 1 tablet (25 mg total) by mouth 2 (two) times daily. 02/25/20 02/24/21  Clegg, Amy D, NP  dapagliflozin propanediol (FARXIGA) 5 MG TABS  tablet Take 1 tablet (5 mg total) by mouth daily. 08/06/20   Angelica Pou, MD  digoxin (LANOXIN) 0.125 MG tablet Take 1 tablet (0.125 mg total) by mouth daily. 11/09/19   Bensimhon, Shaune Pascal, MD  Dulaglutide (TRULICITY) 0.34 JZ/7.9XT SOPN Inject 0.75 mg into the skin once a week. 07/08/20   Velna Ochs, MD  ENTRESTO 97-103 MG TAKE 1 TABLET BY MOUTH 2 (TWO) TIMES DAILY. 05/30/20   Bensimhon, Shaune Pascal, MD  fluticasone Asencion Islam) 50 MCG/ACT nasal spray Apply 2 sprays each nostril daily for 1-2 weeks 09/05/20   Kennith Gain, MD  gabapentin (NEURONTIN) 300 MG capsule Take 1 capsule (300 mg total) by mouth 3 (three) times daily. 08/13/20 08/13/21  [name], MD  glucose blood (ACCU-CHEK GUIDE) test strip Check blood sugar 3 times per day 06/23/20   [name], MD  guaiFENesin-codeine 100-10 MG/5ML syrup Take 5 mLs by mouth at bedtime. This will make you sleepy, do not take and drive 0/5/69   Riesa Pope, MD  levocetirizine (XYZAL) 5 MG tablet Take 1 tablet (5 mg total) by mouth every evening. 09/05/20   Kennith Gain, MD  metolazone (ZAROXOLYN) 2.5 MG tablet Take 1 tablet (2.5 mg total) by mouth daily as needed (1-2 times per month). As directed by HF clinic for weight gain of 3 lbs overnight or 5 lbs within one week. 01/24/20   Bensimhon, Shaune Pascal, MD  Olopatadine HCl 0.2 % SOLN Apply 1 drop to eye daily as needed. 09/05/20   Kennith Gain, MD  potassium chloride SA (KLOR-CON M20) 20 MEQ tablet TAKE 2 TABLETS BY MOUTH AS NEEDED. WHEN YOU TAKE METOLAZONE 08/13/20   [name], MD  spironolactone (ALDACTONE) 25 MG tablet TAKE 1 TABLET BY MOUTH EVERY DAY 09/08/20   Lyda Jester M, PA-C  torsemide (DEMADEX) 20 MG tablet Take 3 tablets (60 mg total) by mouth daily. 05/01/20   Consuelo Pandy, PA-C    Allergies    Hydrocodone  Review of Systems   Review of Systems  Constitutional:  Negative for fever.  Respiratory:  Positive for cough  and shortness of breath.   Cardiovascular:  Positive for leg swelling. Negative for chest pain.  Neurological:  Positive for dizziness and light-headedness. Negative for syncope.  All other systems reviewed and are negative.  Physical Exam Updated Vital Signs Ht 6' 2"  (1.88 m)   Wt (!) 175 kg   BMI 49.53 kg/m   Physical Exam Constitutional:      General: Jonathan [name] is not in acute distress.    Appearance: Jonathan [name] is obese.  HENT:     Head: Normocephalic and atraumatic.  Eyes:     Extraocular Movements: Extraocular movements intact.     Conjunctiva/sclera: Conjunctivae normal.  Cardiovascular:     Rate and Rhythm: Normal rate and regular rhythm.     Pulses: Normal pulses.  Heart sounds: Normal heart sounds.  Pulmonary:     Effort: Pulmonary effort is normal.     Breath sounds: Rhonchi present.  Abdominal:     General: There is no distension.     Palpations: Abdomen is soft.     Tenderness: There is abdominal tenderness.  Musculoskeletal:     Cervical back: Normal range of motion and neck supple.     Right lower leg: Edema present.     Left lower leg: Edema present.  Skin:    General: Skin is warm and dry.  Neurological:     General: No focal deficit present.     Mental Status: Jonathan [name] is alert. Mental status is at baseline.    ED Results / Procedures / Treatments   Labs (all labs ordered are listed, but only abnormal results are displayed) Labs Reviewed  BASIC METABOLIC PANEL  CBC  URINALYSIS, ROUTINE W REFLEX MICROSCOPIC  CBG MONITORING, ED    EKG None  Radiology DG Chest Portable 1 View  Result Date: 09/11/2020 CLINICAL DATA:  Cough, dizziness and near syncope. EXAM: PORTABLE CHEST 1 VIEW COMPARISON:  10/26/2016 FINDINGS: The heart is enlarged but appears relatively stable. The mediastinal and hilar contours are within normal limits. No acute pulmonary findings. No pleural effusions or pulmonary lesions. No pneumothorax. The bony thorax is intact. IMPRESSION: Stable  cardiac enlargement but no acute pulmonary findings. Electronically Signed   By: Marijo Sanes M.D.   On: 09/11/2020 09:31    Procedures Procedures   Medications Ordered in ED Medications - No data to display  ED Course  I have reviewed the triage vital signs and the nursing notes.  Pertinent labs & imaging results that were available during my care of the Jonathan [name] were reviewed by me and considered in my medical decision making (see chart for details).    MDM Rules/Calculators/A&P                           Jionni Helming is a 35 y.o. male with PMH of HFrEF (EF 30-35%), HTN, DM2, asthma, OSA, morbid obesity. who presents for dizziness and a near syncopal episode which Jonathan [name] attributes is due to mold in his house. On presentation BP 80/52, HR 89, RR 24. On exam lungs with mild crackles, normal WOB. LE with 1-2+ pitting edema to calf bilaterally. Weight 385lbs which is above baseline weight of 378-382. CXR with stable cardiac enlargement and no acute pulmonary findings. EKG with SR, RBBB, unchanged from March. Labs significant for BNP 803.3, Na 127, Cl 88, BUN 29, Cr 1.56 (baseline 0.9), GFR 59. CBG 348. CBC wnl.  UA with greater than 500 glucose, 100 protein.  Jonathan [name] is adamant about leaving, and frustrated that things have been taking longer than she would like.  Called Cardiology to examine Jonathan [name], but in procedures and unable to see him at this time, but recommended admission and that they would follow-up.  States that if Jonathan [name] is adamant about going home, to check digoxin level, and if greater than 1 to hold the digoxin, hold entresto and spironolactone until Jonathan [name] follows up with them.  States office will call the Jonathan [name] to set up a follow-up appointment.  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 Jonathan [name]  said they will reach out to cardiology and see them outpatient. Discharged some Zofran for his nausea and strict return precautions.    Final Clinical Impression(s) / ED Diagnoses Final diagnoses:  None    Rx / DC Orders ED Discharge Orders     None        Shary Key, DO 09/11/20 1530    Elnora Morrison, MD 09/11/20 1729

## 2020-09-11 NOTE — ED Notes (Addendum)
This RN attempted to start an IV to Right posterior wrist, pt refused to have an IV started in this site, RN looked at [facility], 2 RN's unsuccessful, spouse at bedside requesting IV team. EMS attempted an IV start in RAC. Spouse standing over the top of staff during pt care, states, "I want to make sure it's done right".  Pt is now willing to let RN start an IV in hand.  Transport came to room to take pt for a 2 view CXR, spouse refused test, states, "if you cant get what you need from the first one then I'm sorry, your 're not repeating a CXR." EDP notified

## 2020-09-15 ENCOUNTER — Encounter (HOSPITAL_COMMUNITY): Payer: Medicaid Other

## 2020-09-29 ENCOUNTER — Encounter: Payer: Medicaid Other | Admitting: Internal Medicine

## 2020-10-06 ENCOUNTER — Other Ambulatory Visit: Payer: Self-pay

## 2020-10-06 ENCOUNTER — Other Ambulatory Visit: Payer: Self-pay | Admitting: *Deleted

## 2020-10-06 NOTE — Patient Instructions (Signed)
Visit Information  Jonathan [name] was given information about Medicaid Managed Care team care coordination services as a part of their [place] Medicaid benefit. [name] verbally consented to engagement with the [facility] Managed Care team.   If you are experiencing a medical emergency, please call 911 or report to your local emergency department or urgent care.   If you have a non-emergency medical problem during routine business hours, please contact your provider's office and ask to speak with a nurse.   For questions related to your [facility], please call: [phone] or visit the homepage here: [email or web address]  If you would like to schedule transportation through your [facility], please call the following number at least 2 days in advance of your appointment: [phone].   Call the [place] at [phone], at any time, 24 hours a day, 7 days a week. If you are in danger or need immediate medical attention call 911.  If you would like help to quit smoking, call 1-800-QUIT-NOW 5303821604) OR Espaol: 1-855-Djelo-Ya (6-606-004-5997) o para ms informacin haga clic aqu or Text READY to 200-400 to register via text  Mr. Bartok - following are the goals we discussed in your visit today:   Goals Addressed             This Visit's Progress    Monitor and Manage My Blood Sugar-Diabetes Type 2       Timeframe:  Long-Range Goal Priority:  High Start Date:   08/12/20                          Expected End Date: 11/13/20                      Follow Up Date 11/05/20    - check blood sugar at prescribed times - check blood sugar if I feel it is too high or too low - enter blood sugar readings and medication or insulin into daily log - take the blood sugar log to all doctor visits - take the blood sugar meter to all doctor  visits - work on eating 3 small meals a day, including lean protein, limiting carbohydrates - increase exercise, example-take a 10 minute walk after each meal  - attend appointment with PCP 9/1 - take all medications as prescribed   Why is this important?   Checking your blood sugar at home helps to keep it from getting very high or very low.  Writing the results in a diary or log helps the doctor know how to care for you.  Your blood sugar log should have the time, date and the results.  Also, write down the amount of insulin or other medicine that you take.  Other information, like what you ate, exercise done and how you were feeling, will also be helpful.          Track and Manage Symptoms-Heart Failure       Timeframe:  Long-Range Goal Priority:  High Start Date:  08/12/20                           Expected End Date:  11/13/20                     Follow Up Date 11/05/20    - eat more whole grains, fruits and vegetables, lean  meats and healthy fats - know when to call the doctor - track symptoms and what helps feel better or worse - weigh daily, first thing in the morning after emptying your bladder. Report weight change of 3# in one day or 5# in a week  - take all medication as prescribed - attend appointment on 9/29 with Dr. Haroldine Laws   Why is this important?   You will be able to handle your symptoms better if you keep track of them.  Making some simple changes to your lifestyle will help.  Eating healthy is one thing you can do to take good care of yourself.            Please see education materials related to CHF and DM provided by MyChart link.  Patient has MyChart and has access to the portal for reviewing Care Plan and education materials.  Telephone follow up appointment with Managed Medicaid care management team member scheduled for:11/05/20 @ [place] RN, BSN [place] RN Care Coordinator   Following is a copy of  your plan of care:  Patient Care Plan: Diabetes Type 2 (Adult)     Problem Identified: Glycemic Management (Diabetes, Type 2)      Long-Range Goal: Glycemic Management Optimized   Start Date: 08/12/2020  Expected End Date: 11/13/2020  Recent Progress: On track  Priority: High  Note:   Objective:  Lab Results  Component Value Date   HGBA1C >14.0 (A) 06/17/2020   Lab Results  Component Value Date   CREATININE 1.56 (H) 09/11/2020   CREATININE 0.93 05/29/2020   CREATININE 0.97 05/01/2020   No results found for: EGFR Current Barriers:  Knowledge Deficits related to basic Diabetes pathophysiology and self care/management-Jonathan [name] would like to see his A1C lower than it has been. He has started taking trulicity in hopes that it will improve his readings. He does not like to be stuck, and relies on his spouse to check his blood sugar and administer his injections. He has a decreased appetite, some days not eating at all. Recent fasting readings were 244 and 367. He admits to being emotional and eating a lot of sugary foods the night before the 367 reading.-Update-Jonathan [name] is not checking his blood sugars and has had a decreased appetite since having Covid. Unable to independently administer injectable medication and monitor blood glucose Case Manager Clinical Goal(s):  patient will demonstrate improved adherence to prescribed treatment plan for diabetes self care/management as evidenced by: daily monitoring and recording of CBG,  adherence to ADA/ carb modified diet, exercise 5 days/week, adherence to prescribed medication regimen, contacting provider for new or worsened symptoms or questions Interventions:  Inter-disciplinary care team collaboration (see longitudinal plan of care) Reviewed medications with patient and discussed importance of medication adherence Discussed plans with patient for ongoing care management follow up and provided patient with direct contact information for  care management team Provided patient with educational materials related diabetes Reviewed scheduled/upcoming provider appointments including: PCP on 9/1 and Dr. Haroldine Laws on 9/29 Discussed [facility] Pharmacist role, Jonathan [name] declined referral Review of patient status, including review of consultants reports, relevant laboratory and other test results, and medications completed. Encouraged patient to try to eat 3 small meals a day, suggested starting with 2 and work up to 3. These meals should include a lean protein. Discussed the importance of exercise and encouraged patient to take a 10 minute walk after each meal Discussed the importance of routine eye exams,  encouraged patient to discuss referral with PCP on 9/1 Self-Care Activities - Self administers oral medications as prescribed Attends all scheduled provider appointments Checks blood sugars as prescribed and utilize hyper and hypoglycemia protocol as needed Adheres to prescribed ADA/carb modified Patient Goals: - check blood sugar at prescribed times - check blood sugar if I feel it is too high or too low - enter blood sugar readings and medication or insulin into daily log - take the blood sugar log to all doctor visits - take the blood sugar meter to all doctor visits - work on eating 3 small meals a day, including lean protein, limiting carbohydrates - increase exercise, example-take a 10 minute walk after each meal  - attend appointment with PCP 9/1 - take all medications as prescribed  Follow Up Plan: Telephone follow up appointment with care management team member scheduled for:11/05/20 @ 9am     Patient Care Plan: Heart Failure (Adult)     Problem Identified: Symptom Exacerbation (Heart Failure)      Long-Range Goal: Symptom Exacerbation Prevented or Minimized   Start Date: 08/12/2020  Expected End Date: 11/13/2020  Recent Progress: On track  Priority: High  Note:   Current Barriers:  Knowledge deficit related to  basic heart failure pathophysiology and self care management-Jonathan [name] is managing heart failure with diet and medications. He understands that he should be weighing daily, but sometimes he just forgets. He is limiting his salt intake and trying to eat heart healthy. He does not take torsemide exactly as prescribed due to side effects. -Update-Jonathan [name] has had difficulty sleeping since Covid and has no energy to exercise. Feels like there is mold in his apartment and this is why he feels so bad. He is moving in one week to a mold free home. Does not adhere to prescribed medication regimen Case Manager Clinical Goal(s):  patient will weigh self daily and record patient will verbalize understanding of Heart Failure Action Plan and when to call doctor patient will take all Heart Failure mediations as prescribed patient will weigh daily and record (notifying MD of 3 lb weight gain over night or 5 lb in a week) Interventions:  Basic overview and discussion of pathophysiology of Heart Failure reviewed  Provided verbal education on low sodium diet Provided written education on low sodium diet Advised patient to weigh each morning after emptying bladder Discussed importance of daily weight and advised patient to weigh and record daily Reviewed role of diuretics in prevention of fluid overload and management of heart failure Encouraged to exercise for short periods of time to increase energy, and benefit health management Discussed benefits of therapy and referral to LCSW, patient declined Advised patient to call for appointment with Dr. Haroldine Laws earlier than 9/29 Patient Goals/Self-Care Activities - eat more whole grains, fruits and vegetables, lean meats and healthy fats - know when to call the doctor - track symptoms and what helps feel better or worse - weigh daily, first thing in the morning after emptying your bladder. Report weight change of 3# in one day or 5# in a week  - take all  medication as prescribed - attend appointment on 9/29 with Dr. Haroldine Laws Follow Up Plan: Telephone follow up appointment with care management team member scheduled for:11/05/20 @ 9am

## 2020-10-06 NOTE — Patient Outreach (Signed)
Medicaid Managed Care   Nurse Care Manager Note  10/06/2020 Name:  Jonathan [name] MRN:  [id number] DOB:  July 16, 1985  Jonathan [name] is an 35 y.o. year old male who is a primary patient of [name], MD.  The [facility] Managed Care Coordination team was consulted for assistance with:    CHF DMII  Jonathan [name] was given information about Medicaid Managed Care Coordination team services today. Jonathan [name] Patient agreed to services and verbal consent obtained.  Engaged with patient by telephone for follow up visit in response to provider referral for case management and/or care coordination services.   Assessments/Interventions:  Review of past medical history, allergies, medications, health status, including review of consultants reports, laboratory and other test data, was performed as part of comprehensive evaluation and provision of chronic care management services.  SDOH (Social Determinants of Health) assessments and interventions performed: SDOH Interventions    Flowsheet Row Most Recent Value  SDOH Interventions   Food Insecurity Interventions Intervention Not Indicated  Transportation Interventions Intervention Not Indicated       Care Plan  Allergies  Allergen Reactions   Hydrocodone     Hives     Medications Reviewed Today     Reviewed by Melissa Montane, RN (Registered Nurse) on 10/06/20 at [phone]  Med List Status: <None>   Medication Order Taking? Sig Documenting Provider Last Dose Status Informant  Accu-Chek Softclix Lancets lancets [id number] Yes Check blood sugar 3x a day  Patient taking differently: 1 each by Other route See admin instructions. Check blood sugar 3x a day   Asencion Noble, MD Taking Active   albuterol (VENTOLIN HFA) 108 (90 Base) MCG/ACT inhaler [id number] Yes Inhale 2 puffs into the lungs every 4 (four) hours as needed for wheezing or shortness of breath. Kennith Gain, MD Taking Active Spouse/Significant Other  azelastine  (ASTELIN) 0.1 % nasal spray [id number] Yes Place 2 sprays into both nostrils 2 (two) times daily. Use in each nostril as directed  Patient taking differently: Place 2 sprays into both nostrils 2 (two) times daily.   Kennith Gain, MD Taking Active   blood glucose meter kit and supplies KIT [id number] Yes Dispense based on patient and insurance preference. Use up to four times daily as directed. (FOR ICD-9 250.00, 250.01). For QAC - HS accuchecks. May switch to any brand.  Patient taking differently: Inject 1 each into the skin See admin instructions. Dispense based on patient and insurance preference. Use up to four times daily as directed. (FOR ICD-9 250.00, 250.01). For QAC - HS accuchecks. May switch to any brand.   Thurnell Lose, MD Taking Active            Med Note Owens Shark, JASMINE Q   Thu Apr 19, 2019 11:51 AM)    Blood Glucose Monitoring Suppl (ACCU-CHEK GUIDE) w/Device KIT [id number] Yes Check blood sugar 3 times a day  Patient taking differently: 1 each by Other route See admin instructions. Check blood sugar 3 times a day   [name], MD Taking Active   carvedilol (COREG) 25 MG tablet [id number] Yes Take 1 tablet (25 mg total) by mouth 2 (two) times daily. Darrick Grinder D, NP Taking Active Spouse/Significant Other  dapagliflozin propanediol (FARXIGA) 5 MG TABS tablet [id number] No Take 1 tablet (5 mg total) by mouth daily.  Patient not taking: No sig reported   Angelica Pou, MD Not Taking Active Spouse/Significant Other           Med  Note (Garry Bochicchio A   Mon Oct 06, 2020  9:16 AM) Stopped taking due to new rash  digoxin (LANOXIN) 0.125 MG tablet [id number] Yes Take 1 tablet (0.125 mg total) by mouth daily. Bensimhon, Shaune Pascal, MD Taking Active Spouse/Significant Other  Dulaglutide (TRULICITY) 5.44 BE/0.1EO SOPN [id number] Yes Inject 0.75 mg into the skin once a week.  Patient taking differently: Inject 0.75 mg into the skin once a week. Saturdays.   Velna Ochs, MD Taking Active   ENTRESTO 97-103 MG [id number] Yes TAKE 1 TABLET BY MOUTH 2 (TWO) TIMES DAILY. Bensimhon, Shaune Pascal, MD Taking Active Spouse/Significant Other  fluticasone (FLONASE) 50 MCG/ACT nasal spray [id number] Yes Apply 2 sprays each nostril daily for 1-2 weeks  Patient taking differently: Place 2 sprays into both nostrils See admin instructions. Apply 2 sprays each nostril daily for 1-2 weeks (for mold allergies)   Padgett, Rae Halsted, MD Taking Active   gabapentin (NEURONTIN) 300 MG capsule [id number] Yes Take 1 capsule (300 mg total) by mouth 3 (three) times daily.  Patient taking differently: Take 300 mg by mouth as needed (neuropathy.).   [name], MD Taking Active   glucose blood (ACCU-CHEK GUIDE) test strip [id number] Yes Check blood sugar 3 times per day  Patient taking differently: 1 each by Other route See admin instructions. Check blood sugar 3 times per day   [name], MD Taking Active   guaiFENesin-codeine 100-10 MG/5ML syrup [id number] No Take 5 mLs by mouth at bedtime. This will make you sleepy, do not take and drive  Patient not taking: Reported on 10/06/2020   Riesa Pope, MD Not Taking Active Spouse/Significant Other  levocetirizine (XYZAL) 5 MG tablet [id number] Yes Take 1 tablet (5 mg total) by mouth every evening. Kennith Gain, MD Taking Active Spouse/Significant Other  metolazone (ZAROXOLYN) 2.5 MG tablet [id number] Yes Take 1 tablet (2.5 mg total) by mouth daily as needed (1-2 times per month). As directed by HF clinic for weight gain of 3 lbs overnight or 5 lbs within one week. Bensimhon, Shaune Pascal, MD Taking Active Spouse/Significant Other  Olopatadine HCl 0.2 % SOLN [id number] No Apply 1 drop to eye daily as needed.  Patient not taking: Reported on 10/06/2020   Kennith Gain, MD Not Taking Active            Med Note [place] Sep 11, 2020 12:16 PM) Hasn't picked up from Rx.   ondansetron  (ZOFRAN ODT) 4 MG disintegrating tablet [id number] No Take 1 tablet (4 mg total) by mouth every 8 (eight) hours as needed for nausea or vomiting.  Patient not taking: Reported on 10/06/2020   Shary Key, DO Not Taking Active   potassium chloride SA (KLOR-CON M20) 20 MEQ tablet [id number] Yes TAKE 2 TABLETS BY MOUTH AS NEEDED. WHEN YOU TAKE METOLAZONE  Patient taking differently: Take 40 mEq by mouth See admin instructions. TAKE 2 TABLETS BY MOUTH AS NEEDED. WHEN YOU TAKE METOLAZONE   [name], MD Taking Active   spironolactone (ALDACTONE) 25 MG tablet [id number] Yes TAKE 1 TABLET BY MOUTH EVERY DAY  Patient taking differently: Take 25 mg by mouth daily.   Lyda Jester M, PA-C Taking Active   torsemide (DEMADEX) 20 MG tablet [id number] Yes Take 3 tablets (60 mg total) by mouth daily. Consuelo Pandy, PA-C Taking Active Spouse/Significant Other           Med Note (Dalylah Ramey A   Mon Oct 06, 2020  9:20 AM) Taking as needed depending on how he is feeling  Med List Note Elyn Peers, CPhT 04/19/16 2114): [place]            Patient Active Problem List   Diagnosis Date Noted   Abdominal pain 08/20/2020   Mold exposure 06/17/2020   Depression 11/06/2017   Charcot ankle, left 05/31/2017   Acute pain of left foot 05/03/2017   Diabetic polyneuropathy associated with type 2 diabetes mellitus ([place]) 03/14/2017   OSA (obstructive sleep apnea) 49/20/1007   Chronic systolic (congestive) heart failure ([place])    Morbid obesity with BMI of 50.0-59.9, adult ([place]) 10/26/2016   Asthma, chronic, mild persistent, uncomplicated 02/03/7587   Essential hypertension 10/26/2016   Diabetes mellitus type 2 in obese ([place]) 04/20/2016    Conditions to be addressed/monitored per PCP order:  CHF and DMII  Care Plan : Diabetes Type 2 (Adult)  Updates made by Melissa Montane, RN since 10/06/2020 12:00 AM     Problem: Glycemic Management (Diabetes, Type 2)       Long-Range Goal: Glycemic Management Optimized   Start Date: 08/12/2020  Expected End Date: 11/13/2020  Recent Progress: On track  Priority: High  Note:   Objective:  Lab Results  Component Value Date   HGBA1C >14.0 (A) 06/17/2020   Lab Results  Component Value Date   CREATININE 1.56 (H) 09/11/2020   CREATININE 0.93 05/29/2020   CREATININE 0.97 05/01/2020   No results found for: EGFR Current Barriers:  Knowledge Deficits related to basic Diabetes pathophysiology and self care/management-Mr. Beverlin would like to see his A1C lower than it has been. He has started taking trulicity in hopes that it will improve his readings. He does not like to be stuck, and relies on his spouse to check his blood sugar and administer his injections. He has a decreased appetite, some days not eating at all. Recent fasting readings were 244 and 367. He admits to being emotional and eating a lot of sugary foods the night before the 367 reading.-Update-Mr. Reali is not checking his blood sugars and has had a decreased appetite since having Covid. Unable to independently administer injectable medication and monitor blood glucose Case Manager Clinical Goal(s):  patient will demonstrate improved adherence to prescribed treatment plan for diabetes self care/management as evidenced by: daily monitoring and recording of CBG,  adherence to ADA/ carb modified diet, exercise 5 days/week, adherence to prescribed medication regimen, contacting provider for new or worsened symptoms or questions Interventions:  Inter-disciplinary care team collaboration (see longitudinal plan of care) Reviewed medications with patient and discussed importance of medication adherence Discussed plans with patient for ongoing care management follow up and provided patient with direct contact information for care management team Provided patient with educational materials related diabetes Reviewed scheduled/upcoming provider appointments  including: PCP on 9/1 and Dr. Haroldine Laws on 9/29 Discussed MM Pharmacist role, Mr. Baumler declined referral Review of patient status, including review of consultants reports, relevant laboratory and other test results, and medications completed. Encouraged patient to try to eat 3 small meals a day, suggested starting with 2 and work up to 3. These meals should include a lean protein. Discussed the importance of exercise and encouraged patient to take a 10 minute walk after each meal Discussed the importance of routine eye exams, encouraged patient to discuss referral with PCP on 9/1 Self-Care Activities - Self administers oral medications as prescribed Attends all scheduled provider appointments Checks blood sugars as prescribed and  utilize hyper and hypoglycemia protocol as needed Adheres to prescribed ADA/carb modified Patient Goals: - check blood sugar at prescribed times - check blood sugar if I feel it is too high or too low - enter blood sugar readings and medication or insulin into daily log - take the blood sugar log to all doctor visits - take the blood sugar meter to all doctor visits - work on eating 3 small meals a day, including lean protein, limiting carbohydrates - increase exercise, example-take a 10 minute walk after each meal  - attend appointment with PCP 9/1 - take all medications as prescribed  Follow Up Plan: Telephone follow up appointment with care management team member scheduled for:11/05/20 @ [place] : Heart Failure (Adult)  Updates made by Melissa Montane, RN since 10/06/2020 12:00 AM     Problem: Symptom Exacerbation (Heart Failure)      Long-Range Goal: Symptom Exacerbation Prevented or Minimized   Start Date: 08/12/2020  Expected End Date: 11/13/2020  Recent Progress: On track  Priority: High  Note:   Current Barriers:  Knowledge deficit related to basic heart failure pathophysiology and self care management-Mr. Pfohl is managing heart  failure with diet and medications. He understands that he should be weighing daily, but sometimes he just forgets. He is limiting his salt intake and trying to eat heart healthy. He does not take torsemide exactly as prescribed due to side effects. -Update-Mr. Fishburn has had difficulty sleeping since Covid and has no energy to exercise. Feels like there is mold in his apartment and this is why he feels so bad. He is moving in one week to a mold free home. Does not adhere to prescribed medication regimen Case Manager Clinical Goal(s):  patient will weigh self daily and record patient will verbalize understanding of Heart Failure Action Plan and when to call doctor patient will take all Heart Failure mediations as prescribed patient will weigh daily and record (notifying MD of 3 lb weight gain over night or 5 lb in a week) Interventions:  Basic overview and discussion of pathophysiology of Heart Failure reviewed  Provided verbal education on low sodium diet Provided written education on low sodium diet Advised patient to weigh each morning after emptying bladder Discussed importance of daily weight and advised patient to weigh and record daily Reviewed role of diuretics in prevention of fluid overload and management of heart failure Encouraged to exercise for short periods of time to increase energy, and benefit health management Discussed benefits of therapy and referral to LCSW, patient declined Advised patient to call for appointment with Dr. Haroldine Laws earlier than 9/29 Patient Goals/Self-Care Activities - eat more whole grains, fruits and vegetables, lean meats and healthy fats - know when to call the doctor - track symptoms and what helps feel better or worse - weigh daily, first thing in the morning after emptying your bladder. Report weight change of 3# in one day or 5# in a week  - take all medication as prescribed - attend appointment on 9/29 with Dr. Haroldine Laws Follow Up Plan:  Telephone follow up appointment with care management team member scheduled for:11/05/20 @ 9am      Follow Up:  Patient agrees to Care Plan and Follow-up.  Plan: The Managed Medicaid care management team will reach out to the patient again over the next 30 days.  Date/time of next scheduled RN care management/care coordination outreach:  11/05/20 @ [place] RN, [place]  [place]

## 2020-10-12 ENCOUNTER — Other Ambulatory Visit (HOSPITAL_COMMUNITY): Payer: Self-pay | Admitting: Internal Medicine

## 2020-10-16 ENCOUNTER — Ambulatory Visit (INDEPENDENT_AMBULATORY_CARE_PROVIDER_SITE_OTHER): Payer: Medicaid Other | Admitting: Internal Medicine

## 2020-10-16 ENCOUNTER — Encounter: Payer: Self-pay | Admitting: Internal Medicine

## 2020-10-16 ENCOUNTER — Other Ambulatory Visit: Payer: Self-pay

## 2020-10-16 VITALS — BP 120/81 | HR 117 | Temp 98.4°F | Resp 32 | Ht 74.0 in | Wt 395.1 lb

## 2020-10-16 DIAGNOSIS — E1169 Type 2 diabetes mellitus with other specified complication: Secondary | ICD-10-CM | POA: Diagnosis present

## 2020-10-16 DIAGNOSIS — B3742 Candidal balanitis: Secondary | ICD-10-CM

## 2020-10-16 DIAGNOSIS — L304 Erythema intertrigo: Secondary | ICD-10-CM

## 2020-10-16 DIAGNOSIS — I5022 Chronic systolic (congestive) heart failure: Secondary | ICD-10-CM | POA: Diagnosis not present

## 2020-10-16 DIAGNOSIS — E669 Obesity, unspecified: Secondary | ICD-10-CM | POA: Diagnosis not present

## 2020-10-16 DIAGNOSIS — E1142 Type 2 diabetes mellitus with diabetic polyneuropathy: Secondary | ICD-10-CM | POA: Diagnosis not present

## 2020-10-16 LAB — POCT GLYCOSYLATED HEMOGLOBIN (HGB A1C): HbA1c POC (<> result, manual entry): >14 % — AB (ref 4.0–5.6)

## 2020-10-16 LAB — GLUCOSE, CAPILLARY: Glucose-Capillary: 350 mg/dL — ABNORMAL HIGH (ref 70–99)

## 2020-10-16 MED ORDER — ONDANSETRON HCL 4 MG PO TABS: 4.0000 mg | ORAL_TABLET | Freq: Every day | ORAL | 1 refills | Status: DC | PRN

## 2020-10-16 MED ORDER — FLUCONAZOLE 100 MG PO TABS
150.0000 mg | ORAL_TABLET | Freq: Every day | ORAL | 0 refills | Status: DC
Start: 2020-10-16 — End: 2020-11-12

## 2020-10-16 MED ORDER — POTASSIUM CHLORIDE CRYS ER 20 MEQ PO TBCR: EXTENDED_RELEASE_TABLET | ORAL | 3 refills | Status: DC

## 2020-10-16 MED ORDER — NYSTATIN 100000 UNIT/GM EX POWD: 1.0000 "application " | Freq: Three times a day (TID) | CUTANEOUS | 0 refills | Status: DC

## 2020-10-16 NOTE — Progress Notes (Signed)
   CC: DM  HPI:  Mr.Koehn Denison is a 35 y.o. person, with a PMH noted below, who presents to the clinic diabetes follow-up. To see the management of their acute and chronic conditions, please see the A&P note under the Encounters tab.   Past Medical History:  Diagnosis Date   Acute pain of left foot 05/03/2017   Asthma    Asthma, chronic, mild persistent, uncomplicated 10/26/2016   Charcot ankle, left 05/31/2017   CHF (congestive heart failure) (HCC)    Chronic systolic (congestive) heart failure (HCC)    Diabetes mellitus type 2 in obese (HCC) 04/21/2014   Diabetic polyneuropathy associated with type 2 diabetes mellitus (HCC) 03/14/2017   Essential hypertension 10/26/2016   Hypertension    Obesity    Obesity, Class III, BMI 40-49.9 (morbid obesity) (HCC) 10/26/2016   OSA (obstructive sleep apnea) 12/02/2016   Type 2 diabetes mellitus with diabetic neuropathy (HCC)    Review of Systems:   Review of Systems  Constitutional:  Negative for chills, fever, malaise/fatigue and weight loss.  Respiratory:  Positive for cough. Negative for hemoptysis, sputum production, shortness of breath and wheezing.   Cardiovascular:  Positive for leg swelling. Negative for chest pain and palpitations.  Gastrointestinal:  Positive for nausea. Negative for abdominal pain, constipation, diarrhea, heartburn and vomiting.  Skin:  Positive for rash.  Neurological:  Negative for dizziness and headaches.    Physical Exam:  Vitals:   10/16/20 1027  BP: 120/81  Pulse: (!) 117  Resp: (!) 32  Temp: 98.4 F (36.9 C)  TempSrc: Oral  SpO2: 95%  Weight: (!) 395 lb 1.6 oz (179.2 kg)  Height: 6\' 2"  (1.88 m)   Physical Exam Constitutional:      General: He is not in acute distress.    Appearance: He is obese. He is not ill-appearing, toxic-appearing or diaphoretic.     Comments: Sitting comfortably in wheelchair, no acute distress, answering questions appropriately  Cardiovascular:     Rate and  Rhythm: Normal rate and regular rhythm.     Pulses: Normal pulses.  Pulmonary:     Effort: Pulmonary effort is normal. No respiratory distress.     Breath sounds: Normal breath sounds. No wheezing, rhonchi or rales.  Abdominal:     General: Abdomen is flat.  Musculoskeletal:     Right lower leg: Edema present.     Left lower leg: Edema present.     Comments: 2-3+ pitting edema bilaterally up to the knees lower extremities   Skin:    Comments: Chronic skin changes of the lower legs.  Neurological:     Mental Status: He is oriented to person, place, and time.  Psychiatric:        Mood and Affect: Mood normal.        Behavior: Behavior normal.     Assessment & Plan:   See Encounters Tab for problem based charting.  Patient discussed with Dr. 

## 2020-10-16 NOTE — Patient Instructions (Signed)
To [name],   It was a pleasure seeing you again! Today we discussed your diabetes, nausea, and leg pain.   For your diabetes, your A1c is >14. I will increase the Trulicity, please continue to take it at the same time. We will repeat your A1c in 3 months, and if it continues to be elevated I strongly recommend starting insulin to bring your A1c down. I will also prescribe fluconazole and nystatin powder for your infection secondary to your [place].   For your nausea, I will prescribe Zofran.   For your leg pain, I will assess your kidney function to see if we can increase your gabapentin. I believe your pain is secondary to your heart failure and diabetes. I have ordered more potassium for diureses to avoid cramping. Additionally, I would get compression stockings as well to decrease the swelling. We will continue to optimize your diabetes medications.   I will see you in three months! I hope you have a relaxing Labor Day weekend.  Jonathan Amen, MD

## 2020-10-17 ENCOUNTER — Encounter: Payer: Self-pay | Admitting: Internal Medicine

## 2020-10-17 DIAGNOSIS — L304 Erythema intertrigo: Secondary | ICD-10-CM | POA: Insufficient documentation

## 2020-10-17 MED ORDER — TRULICITY 1.5 MG/0.5ML ~~LOC~~ SOAJ: 1.5000 mg | SUBCUTANEOUS | 2 refills | Status: DC

## 2020-10-17 NOTE — Assessment & Plan Note (Signed)
Patient with presenting with a rash in between abdominal folds with complaints of burning and itching.  On examination, patient has erythematous lesions on the abdominal pannus consistent with intertrigo.  Instructed to keep the area dry. -Nystatin powder 3 times daily as needed

## 2020-10-17 NOTE — Assessment & Plan Note (Signed)
Patient presenting today with candidiasis secondary to using [place].  He has continued this medication with mild improvement, but his symptoms still persist. - Fluconazole 150 mg once

## 2020-10-17 NOTE — Assessment & Plan Note (Addendum)
Patient presents to the clinic with increasing 10 pounds from his last ED visit.  He does have 2-3+ bilateral lower pitting edema, and has been unable to tolerate his diuretics due to running out of his potassium supplement.  He appears clinically stable, no crackles appreciated on his lung examination.  He does have a follow-up visit with cardiology later this month. - Reordered Klor-Con 20 mEq to be used with metolazone. - Continue current regimen of Coreg 25 mg 2 times daily, digoxin 0.125 mg daily, Entresto 97- 103 mg twice daily, metolazone 2.5 mg tablets, Aldactone 25 mg daily, torsemide 20 mg daily, and Trulicity 1.5 mg subcutaneously weekly - Follow-up with cardiology

## 2020-10-17 NOTE — Assessment & Plan Note (Signed)
Patient presenting today for follow-up on his diabetes. Patient currently on Trulicity 0.75 subcutaneous once weekly dosing and Farxiga 5 mg daily. Unfortunately, while taking [place] he developed yeast infection, and self discontinued the medication due to the side effects. He is able to tolerate the Trulicity 0.75 and is adherent to this medication.  Unfortunately, his hemoglobin A1c continues to be elevated greater than 14.  We had a discussion about the benefits of starting insulin therapy, even though the patient has a phobia for needles.  Jonathan [name] states that he is seriously considering starting insulin even if it means daily injections.  He would like more time to consider this intervention.  In the interim, we will increase his Trulicity.  Jonathan [name] is also starting an exercise regimen this month, which I think will help in the long-term for his diabetes. - Increase Trulicity to 1.5 mg weekly subcutaneously - Follow-up A1c in 3 months, if his A1c continues to be elevated in spite of increase Trulicity and lifestyle changes we will further discuss insulin therapy and start long-acting insulin if patient is agreeable.

## 2020-10-17 NOTE — Assessment & Plan Note (Signed)
Patient endorsing bilateral foot pain at today's visit.  On physical examination patient does have 2-3+ pitting edema bilaterally with chronic skin changes, improved from last visit in June.  His pain is likely combination due to his edema and polyneuropathy secondary to his diabetes.  He is currently on gabapentin 300 mg nightly, unfortunately during his last ED visit, his creatinine increased to 1.58.  We will assess a BMP for kidney function before increasing his gabapentin to 300 mg 3 times daily. - Continue gabapentin 300 mg nightly - BMP today - If kidney function allows, will increase gabapentin dose. - Advised patient to obtain compression stockings.

## 2020-10-21 NOTE — Progress Notes (Signed)
Internal Medicine Clinic Attending ? ?Case discussed with Dr. Winters  At the time of the visit.  We reviewed the resident?s history and exam and pertinent patient test results.  I agree with the assessment, diagnosis, and plan of care documented in the resident?s note.  ?

## 2020-10-27 DIAGNOSIS — E119 Type 2 diabetes mellitus without complications: Secondary | ICD-10-CM | POA: Diagnosis not present

## 2020-10-27 DIAGNOSIS — E1169 Type 2 diabetes mellitus with other specified complication: Secondary | ICD-10-CM | POA: Diagnosis not present

## 2020-10-28 ENCOUNTER — Emergency Department (HOSPITAL_COMMUNITY): Payer: Medicaid Other

## 2020-10-28 ENCOUNTER — Inpatient Hospital Stay (HOSPITAL_COMMUNITY)
Admission: EM | Admit: 2020-10-28 | Discharge: 2020-11-12 | DRG: 291 | Disposition: A | Discharge status: Hospice | Payer: Medicaid Other | Attending: Internal Medicine | Admitting: Internal Medicine

## 2020-10-28 ENCOUNTER — Encounter (HOSPITAL_COMMUNITY): Payer: Self-pay | Admitting: Emergency Medicine

## 2020-10-28 ENCOUNTER — Other Ambulatory Visit: Payer: Self-pay

## 2020-10-28 ENCOUNTER — Emergency Department: Payer: Self-pay

## 2020-10-28 ENCOUNTER — Ambulatory Visit (INDEPENDENT_AMBULATORY_CARE_PROVIDER_SITE_OTHER): Payer: Medicaid Other | Admitting: Family Medicine

## 2020-10-28 DIAGNOSIS — I5082 Biventricular heart failure: Secondary | ICD-10-CM | POA: Diagnosis present

## 2020-10-28 DIAGNOSIS — I110 Hypertensive heart disease with heart failure: Secondary | ICD-10-CM | POA: Diagnosis not present

## 2020-10-28 DIAGNOSIS — I130 Hypertensive heart and chronic kidney disease with heart failure and stage 1 through stage 4 chronic kidney disease, or unspecified chronic kidney disease: Secondary | ICD-10-CM | POA: Diagnosis not present

## 2020-10-28 DIAGNOSIS — I4892 Unspecified atrial flutter: Secondary | ICD-10-CM | POA: Diagnosis not present

## 2020-10-28 DIAGNOSIS — Z8249 Family history of ischemic heart disease and other diseases of the circulatory system: Secondary | ICD-10-CM

## 2020-10-28 DIAGNOSIS — E1142 Type 2 diabetes mellitus with diabetic polyneuropathy: Secondary | ICD-10-CM | POA: Diagnosis not present

## 2020-10-28 DIAGNOSIS — E669 Obesity, unspecified: Secondary | ICD-10-CM | POA: Diagnosis not present

## 2020-10-28 DIAGNOSIS — L989 Disorder of the skin and subcutaneous tissue, unspecified: Secondary | ICD-10-CM | POA: Diagnosis not present

## 2020-10-28 DIAGNOSIS — Z9111 Patient's noncompliance with dietary regimen: Secondary | ICD-10-CM

## 2020-10-28 DIAGNOSIS — K5900 Constipation, unspecified: Secondary | ICD-10-CM | POA: Diagnosis present

## 2020-10-28 DIAGNOSIS — Z794 Long term (current) use of insulin: Secondary | ICD-10-CM | POA: Diagnosis not present

## 2020-10-28 DIAGNOSIS — E875 Hyperkalemia: Secondary | ICD-10-CM | POA: Diagnosis not present

## 2020-10-28 DIAGNOSIS — Z20822 Contact with and (suspected) exposure to covid-19: Secondary | ICD-10-CM | POA: Diagnosis not present

## 2020-10-28 DIAGNOSIS — I5023 Acute on chronic systolic (congestive) heart failure: Secondary | ICD-10-CM

## 2020-10-28 DIAGNOSIS — M79601 Pain in right arm: Secondary | ICD-10-CM | POA: Diagnosis not present

## 2020-10-28 DIAGNOSIS — I5084 End stage heart failure: Secondary | ICD-10-CM | POA: Diagnosis not present

## 2020-10-28 DIAGNOSIS — E1165 Type 2 diabetes mellitus with hyperglycemia: Secondary | ICD-10-CM | POA: Diagnosis present

## 2020-10-28 DIAGNOSIS — Z66 Do not resuscitate: Secondary | ICD-10-CM | POA: Diagnosis not present

## 2020-10-28 DIAGNOSIS — E871 Hypo-osmolality and hyponatremia: Secondary | ICD-10-CM | POA: Diagnosis not present

## 2020-10-28 DIAGNOSIS — Z841 Family history of disorders of kidney and ureter: Secondary | ICD-10-CM

## 2020-10-28 DIAGNOSIS — E1169 Type 2 diabetes mellitus with other specified complication: Secondary | ICD-10-CM | POA: Diagnosis present

## 2020-10-28 DIAGNOSIS — E1122 Type 2 diabetes mellitus with diabetic chronic kidney disease: Secondary | ICD-10-CM | POA: Diagnosis present

## 2020-10-28 DIAGNOSIS — N189 Chronic kidney disease, unspecified: Secondary | ICD-10-CM | POA: Diagnosis not present

## 2020-10-28 DIAGNOSIS — B379 Candidiasis, unspecified: Secondary | ICD-10-CM | POA: Diagnosis not present

## 2020-10-28 DIAGNOSIS — Z7189 Other specified counseling: Secondary | ICD-10-CM | POA: Diagnosis not present

## 2020-10-28 DIAGNOSIS — R109 Unspecified abdominal pain: Secondary | ICD-10-CM | POA: Diagnosis not present

## 2020-10-28 DIAGNOSIS — I509 Heart failure, unspecified: Secondary | ICD-10-CM

## 2020-10-28 DIAGNOSIS — N179 Acute kidney failure, unspecified: Secondary | ICD-10-CM | POA: Diagnosis not present

## 2020-10-28 DIAGNOSIS — E876 Hypokalemia: Secondary | ICD-10-CM | POA: Diagnosis not present

## 2020-10-28 DIAGNOSIS — K760 Fatty (change of) liver, not elsewhere classified: Secondary | ICD-10-CM | POA: Diagnosis not present

## 2020-10-28 DIAGNOSIS — E785 Hyperlipidemia, unspecified: Secondary | ICD-10-CM

## 2020-10-28 DIAGNOSIS — R1084 Generalized abdominal pain: Secondary | ICD-10-CM | POA: Diagnosis not present

## 2020-10-28 DIAGNOSIS — R609 Edema, unspecified: Secondary | ICD-10-CM | POA: Diagnosis not present

## 2020-10-28 DIAGNOSIS — I517 Cardiomegaly: Secondary | ICD-10-CM | POA: Diagnosis not present

## 2020-10-28 DIAGNOSIS — R0602 Shortness of breath: Secondary | ICD-10-CM | POA: Diagnosis not present

## 2020-10-28 DIAGNOSIS — R21 Rash and other nonspecific skin eruption: Secondary | ICD-10-CM | POA: Diagnosis not present

## 2020-10-28 DIAGNOSIS — I472 Ventricular tachycardia: Secondary | ICD-10-CM | POA: Diagnosis not present

## 2020-10-28 DIAGNOSIS — Z833 Family history of diabetes mellitus: Secondary | ICD-10-CM

## 2020-10-28 DIAGNOSIS — J45909 Unspecified asthma, uncomplicated: Secondary | ICD-10-CM | POA: Diagnosis present

## 2020-10-28 DIAGNOSIS — G4733 Obstructive sleep apnea (adult) (pediatric): Secondary | ICD-10-CM | POA: Diagnosis present

## 2020-10-28 DIAGNOSIS — I428 Other cardiomyopathies: Secondary | ICD-10-CM | POA: Diagnosis not present

## 2020-10-28 DIAGNOSIS — R000 Tachycardia, unspecified: Secondary | ICD-10-CM | POA: Diagnosis not present

## 2020-10-28 DIAGNOSIS — R0689 Other abnormalities of breathing: Secondary | ICD-10-CM | POA: Diagnosis not present

## 2020-10-28 DIAGNOSIS — R110 Nausea: Secondary | ICD-10-CM | POA: Diagnosis not present

## 2020-10-28 DIAGNOSIS — I10 Essential (primary) hypertension: Secondary | ICD-10-CM | POA: Diagnosis not present

## 2020-10-28 DIAGNOSIS — N170 Acute kidney failure with tubular necrosis: Secondary | ICD-10-CM | POA: Diagnosis not present

## 2020-10-28 DIAGNOSIS — Z79899 Other long term (current) drug therapy: Secondary | ICD-10-CM

## 2020-10-28 DIAGNOSIS — E119 Type 2 diabetes mellitus without complications: Secondary | ICD-10-CM

## 2020-10-28 DIAGNOSIS — I5043 Acute on chronic combined systolic (congestive) and diastolic (congestive) heart failure: Secondary | ICD-10-CM | POA: Diagnosis not present

## 2020-10-28 DIAGNOSIS — E872 Acidosis: Secondary | ICD-10-CM | POA: Diagnosis not present

## 2020-10-28 DIAGNOSIS — Z6841 Body Mass Index (BMI) 40.0 and over, adult: Secondary | ICD-10-CM | POA: Diagnosis not present

## 2020-10-28 DIAGNOSIS — Z9114 Patient's other noncompliance with medication regimen: Secondary | ICD-10-CM

## 2020-10-28 DIAGNOSIS — Z885 Allergy status to narcotic agent status: Secondary | ICD-10-CM

## 2020-10-28 DIAGNOSIS — Z9119 Patient's noncompliance with other medical treatment and regimen: Secondary | ICD-10-CM

## 2020-10-28 DIAGNOSIS — Z515 Encounter for palliative care: Secondary | ICD-10-CM | POA: Diagnosis not present

## 2020-10-28 DIAGNOSIS — E118 Type 2 diabetes mellitus with unspecified complications: Secondary | ICD-10-CM | POA: Diagnosis not present

## 2020-10-28 HISTORY — DX: Heart failure, unspecified: I50.9

## 2020-10-28 LAB — I-STAT VENOUS BLOOD GAS, ED
Acid-base deficit: 1 mmol/L (ref 0.0–2.0)
Bicarbonate: 21.9 mmol/L (ref 20.0–28.0)
Calcium, Ion: 1 mmol/L — ABNORMAL LOW (ref 1.15–1.40)
HCT: 55 % — ABNORMAL HIGH (ref 39.0–52.0)
Hemoglobin: 18.7 g/dL — ABNORMAL HIGH (ref 13.0–17.0)
O2 Saturation: 94 %
Potassium: 5 mmol/L (ref 3.5–5.1)
Sodium: 127 mmol/L — ABNORMAL LOW (ref 135–145)
TCO2: 23 mmol/L (ref 22–32)
pCO2, Ven: 31.6 mmHg — ABNORMAL LOW (ref 44.0–60.0)
pH, Ven: 7.448 — ABNORMAL HIGH (ref 7.250–7.430)
pO2, Ven: 67 mmHg — ABNORMAL HIGH (ref 32.0–45.0)

## 2020-10-28 LAB — CBC WITH DIFFERENTIAL/PLATELET
Abs Immature Granulocytes: 0.05 10*3/uL (ref 0.00–0.07)
Basophils Absolute: 0 10*3/uL (ref 0.0–0.1)
Basophils Relative: 1 %
Eosinophils Absolute: 0.1 10*3/uL (ref 0.0–0.5)
Eosinophils Relative: 1 %
HCT: 50.1 % (ref 39.0–52.0)
Hemoglobin: 15.9 g/dL (ref 13.0–17.0)
Immature Granulocytes: 1 %
Lymphocytes Relative: 36 %
Lymphs Abs: 3 10*3/uL (ref 0.7–4.0)
MCH: 30.4 pg (ref 26.0–34.0)
MCHC: 31.7 g/dL (ref 30.0–36.0)
MCV: 95.8 fL (ref 80.0–100.0)
Monocytes Absolute: 0.8 10*3/uL (ref 0.1–1.0)
Monocytes Relative: 10 %
Neutro Abs: 4.3 10*3/uL (ref 1.7–7.7)
Neutrophils Relative %: 51 %
Platelets: 261 10*3/uL (ref 150–400)
RBC: 5.23 MIL/uL (ref 4.22–5.81)
RDW: 14.6 % (ref 11.5–15.5)
WBC: 8.2 10*3/uL (ref 4.0–10.5)
nRBC: 0 % (ref 0.0–0.2)

## 2020-10-28 LAB — BETA-HYDROXYBUTYRIC ACID: Beta-Hydroxybutyric Acid: 0.23 mmol/L (ref 0.05–0.27)

## 2020-10-28 LAB — BASIC METABOLIC PANEL
Anion gap: 14 (ref 5–15)
BUN: 19 mg/dL (ref 6–20)
CO2: 21 mmol/L — ABNORMAL LOW (ref 22–32)
Calcium: 8.9 mg/dL (ref 8.9–10.3)
Chloride: 90 mmol/L — ABNORMAL LOW (ref 98–111)
Creatinine, Ser: 1.63 mg/dL — ABNORMAL HIGH (ref 0.61–1.24)
GFR, Estimated: 56 mL/min — ABNORMAL LOW (ref >60)
Glucose, Bld: 251 mg/dL — ABNORMAL HIGH (ref 70–99)
Potassium: 4.7 mmol/L (ref 3.5–5.1)
Sodium: 125 mmol/L — ABNORMAL LOW (ref 135–145)

## 2020-10-28 LAB — ECHOCARDIOGRAM COMPLETE
AR max vel: 2.08 cm2
AV Area VTI: 2.18 cm2
AV Area mean vel: 1.85 cm2
AV Mean grad: 1 mmHg
AV Peak grad: 1.8 mmHg
Ao pk vel: 0.68 m/s
Area-P 1/2: 6.32 cm2
Calc EF: 24.4 %
MV VTI: 1.46 cm2
S' Lateral: 5 cm
Single Plane A2C EF: 18.5 %
Single Plane A4C EF: 28.1 %

## 2020-10-28 LAB — URINALYSIS, ROUTINE W REFLEX MICROSCOPIC
Bilirubin Urine: NEGATIVE
Glucose, UA: NEGATIVE mg/dL
Hgb urine dipstick: NEGATIVE
Ketones, ur: NEGATIVE mg/dL
Leukocytes,Ua: NEGATIVE
Nitrite: NEGATIVE
Protein, ur: >=300 mg/dL — AB
Specific Gravity, Urine: >1.046 — ABNORMAL HIGH (ref 1.005–1.030)
pH: 5 (ref 5.0–8.0)

## 2020-10-28 LAB — BRAIN NATRIURETIC PEPTIDE: B Natriuretic Peptide: 1547.7 pg/mL — ABNORMAL HIGH (ref 0.0–100.0)

## 2020-10-28 LAB — RESP PANEL BY RT-PCR (FLU A&B, COVID) ARPGX2
Influenza A by PCR: NEGATIVE
Influenza B by PCR: NEGATIVE
SARS Coronavirus 2 by RT PCR: NEGATIVE

## 2020-10-28 LAB — GLUCOSE, CAPILLARY: Glucose-Capillary: 250 mg/dL — ABNORMAL HIGH (ref 70–99)

## 2020-10-28 LAB — CBG MONITORING, ED: Glucose-Capillary: 201 mg/dL — ABNORMAL HIGH (ref 70–99)

## 2020-10-28 MED ORDER — INSULIN GLARGINE-YFGN 100 UNIT/ML ~~LOC~~ SOLN
20.0000 [IU] | Freq: Once | SUBCUTANEOUS | Status: DC
  Filled 2020-10-28: qty 0.2

## 2020-10-28 MED ORDER — ACETAMINOPHEN 650 MG RE SUPP: 650.0000 mg | Freq: Four times a day (QID) | RECTAL | Status: DC | PRN

## 2020-10-28 MED ORDER — IOHEXOL 350 MG/ML SOLN
100.0000 mL | Freq: Once | INTRAVENOUS | Status: AC
  Administered 2020-10-28: 100 mL via INTRAVENOUS

## 2020-10-28 MED ORDER — SODIUM CHLORIDE 0.9% FLUSH
10.0000 mL | Freq: Two times a day (BID) | INTRAVENOUS | Status: DC
  Administered 2020-10-28 – 2020-11-10 (×14): 10 mL

## 2020-10-28 MED ORDER — ONDANSETRON 4 MG PO TBDP
4.0000 mg | ORAL_TABLET | Freq: Once | ORAL | Status: AC
  Administered 2020-10-28: 4 mg via ORAL
  Filled 2020-10-28: qty 1

## 2020-10-28 MED ORDER — INSULIN ASPART 100 UNIT/ML IJ SOLN
0.0000 [IU] | INTRAMUSCULAR | Status: DC
  Administered 2020-10-29: 5 [IU] via SUBCUTANEOUS
  Administered 2020-10-29: 8 [IU] via SUBCUTANEOUS

## 2020-10-28 MED ORDER — LORATADINE 10 MG PO TABS
10.0000 mg | ORAL_TABLET | Freq: Every day | ORAL | Status: DC
  Administered 2020-10-28 – 2020-11-12 (×16): 10 mg via ORAL
  Filled 2020-10-28 (×16): qty 1

## 2020-10-28 MED ORDER — SODIUM CHLORIDE 0.9% FLUSH: 10.0000 mL | INTRAVENOUS | Status: DC | PRN

## 2020-10-28 MED ORDER — LEVOCETIRIZINE DIHYDROCHLORIDE 5 MG PO TABS: 5.0000 mg | ORAL_TABLET | Freq: Every evening | ORAL | Status: DC

## 2020-10-28 MED ORDER — ACETAMINOPHEN 325 MG PO TABS
650.0000 mg | ORAL_TABLET | Freq: Once | ORAL | Status: AC
  Administered 2020-10-28: 650 mg via ORAL
  Filled 2020-10-28: qty 2

## 2020-10-28 MED ORDER — DIGOXIN 125 MCG PO TABS
0.1250 mg | ORAL_TABLET | Freq: Every day | ORAL | Status: DC
  Administered 2020-10-29 – 2020-11-12 (×15): 0.125 mg via ORAL
  Filled 2020-10-28 (×15): qty 1

## 2020-10-28 MED ORDER — MILRINONE LACTATE IN DEXTROSE 20-5 MG/100ML-% IV SOLN
0.3750 ug/kg/min | INTRAVENOUS | Status: DC
  Administered 2020-10-29 – 2020-11-05 (×25): 0.25 ug/kg/min via INTRAVENOUS
  Administered 2020-11-05 – 2020-11-06 (×3): 0.375 ug/kg/min via INTRAVENOUS
  Filled 2020-10-28 (×29): qty 100

## 2020-10-28 MED ORDER — FUROSEMIDE 10 MG/ML IJ SOLN
80.0000 mg | Freq: Once | INTRAMUSCULAR | Status: AC
  Administered 2020-10-28: 80 mg via INTRAVENOUS
  Filled 2020-10-28: qty 8

## 2020-10-28 MED ORDER — SODIUM CHLORIDE 0.9 % IV BOLUS: 500.0000 mL | Freq: Once | INTRAVENOUS | Status: DC

## 2020-10-28 MED ORDER — ENOXAPARIN SODIUM 40 MG/0.4ML IJ SOSY
40.0000 mg | PREFILLED_SYRINGE | INTRAMUSCULAR | Status: DC
  Administered 2020-10-28: 40 mg via SUBCUTANEOUS
  Filled 2020-10-28: qty 0.4

## 2020-10-28 MED ORDER — CHLORHEXIDINE GLUCONATE CLOTH 2 % EX PADS
6.0000 | MEDICATED_PAD | Freq: Every day | CUTANEOUS | Status: DC
  Administered 2020-10-28 – 2020-11-12 (×16): 6 via TOPICAL

## 2020-10-28 MED ORDER — ACETAMINOPHEN 325 MG PO TABS
650.0000 mg | ORAL_TABLET | Freq: Four times a day (QID) | ORAL | Status: DC | PRN
  Administered 2020-10-29 – 2020-11-11 (×11): 650 mg via ORAL
  Filled 2020-10-28 (×11): qty 2

## 2020-10-28 MED ORDER — FUROSEMIDE 10 MG/ML IJ SOLN
30.0000 mg/h | INTRAVENOUS | Status: DC
  Administered 2020-10-28: 10 mg/h via INTRAVENOUS
  Administered 2020-10-29 – 2020-11-01 (×7): 20 mg/h via INTRAVENOUS
  Administered 2020-11-01 – 2020-11-06 (×16): 30 mg/h via INTRAVENOUS
  Filled 2020-10-28 (×36): qty 20

## 2020-10-28 MED ORDER — INSULIN GLARGINE-YFGN 100 UNIT/ML ~~LOC~~ SOLN
10.0000 [IU] | Freq: Every day | SUBCUTANEOUS | Status: DC
  Administered 2020-10-28 – 2020-10-29 (×2): 10 [IU] via SUBCUTANEOUS
  Filled 2020-10-28 (×2): qty 0.1

## 2020-10-28 MED ORDER — SODIUM CHLORIDE 0.9% FLUSH
3.0000 mL | Freq: Two times a day (BID) | INTRAVENOUS | Status: DC
  Administered 2020-10-28 – 2020-11-11 (×16): 3 mL via INTRAVENOUS

## 2020-10-28 NOTE — ED Notes (Signed)
Pt to be transported upstairs after PICC line placement (still at bedside).

## 2020-10-28 NOTE — Progress Notes (Signed)
Peripherally Inserted Central Catheter Placement  The IV Nurse has discussed with the patient and/or persons authorized to consent for the patient, the purpose of this procedure and the potential benefits and risks involved with this procedure.  The benefits include less needle sticks, lab draws from the catheter, and the patient may be discharged home with the catheter. Risks include, but not limited to, infection, bleeding, blood clot (thrombus formation), and puncture of an artery; nerve damage and irregular heartbeat and possibility to perform a PICC exchange if needed/ordered by physician.  Alternatives to this procedure were also discussed.  Bard Power PICC patient education guide, fact sheet on infection prevention and patient information card has been provided to patient /or left at bedside.    PICC Placement Documentation  PICC Double Lumen 10/28/20 PICC Right Cephalic 39 cm 0 cm (Active)  Indication for Insertion or Continuance of Line Vasoactive infusions 10/28/20 1851  Exposed Catheter (cm) 0 cm 10/28/20 1851  Site Assessment Clean;Dry;Intact 10/28/20 1851  Lumen #1 Status Flushed;Saline locked;Blood return noted 10/28/20 1851  Lumen #2 Status Flushed;Saline locked;Blood return noted 10/28/20 1851  Dressing Type Transparent;Securing device 10/28/20 1851  Dressing Status Clean;Dry;Intact 10/28/20 1851  Antimicrobial disc in place? Yes 10/28/20 1851  Safety Lock Not Applicable 10/28/20 1851  Dressing Intervention New dressing;Other (Comment) 10/28/20 1851  Dressing Change Due 11/04/20 10/28/20 1851       Annett Fabian 10/28/2020, 6:53 PM

## 2020-10-28 NOTE — ED Triage Notes (Signed)
Patient with left groin, abdominal pain.  Started about one hour ago.  Patient was tachy and diaphoretic with EMS.  Pain comes and goes.  Patient does have nausea, no vomiting.

## 2020-10-28 NOTE — ED Notes (Signed)
X-Ray tech in room.

## 2020-10-28 NOTE — ED Notes (Signed)
I called PICC team to let them know pt is agreeable to get one.

## 2020-10-28 NOTE — ED Notes (Signed)
Admitting physician at bedside

## 2020-10-28 NOTE — ED Notes (Signed)
Pt refusing all care. Doesn't want any meds given/labs done. Wants to go home. I paged the admitting team and pt is aware. Did agree to let me check his blood glucose and gave him a snack. Said his pain is still a 10/10, but feels like it's because he needs to fart. Refusing everything else.

## 2020-10-28 NOTE — Consult Note (Addendum)
Advanced Heart Failure Team Consult Note   Primary Physician: [name], MD PCP-Cardiologist:  Fransico Him, MD HF MD: Dr Haroldine Laws   Reason for Consultation: A/C HFrEF   HPI:    Jonathan [name] is seen today for evaluation of A/C systolic heart failure  at the request of Dr Jeanell Sparrow.   Jonathan Hiemstra is a 35 year old with a history of chronic HFrEF, NICM, HTN, asthma, OSA, morbid obesity, and uncontrolled DM.   Admitted 10/2016 with progressive SOB x 2 weeks. He reports h/o systolic CHF with EF 33-54% by Echo in [place] in 2017. Cath around that time without any significant CAD per patient. He was diuresed with IV Lasix, discharge weight was 373 pounds. He had run out of most of his medications due to moving to [place] and not having insurance. Meds restarted at discharge: Coreg, Mearl Latin, Lasix.    He had repeat Echo 03/02/2017. EF had normalized to 55% but went back down when he was off HF meds. EF in 2021 back down to 30-35%. He has refused ICD.   Over the last year he has had ongoing difficulty with medication/dietary compliance resulting in volume overload. Managing diuretics in the community has been a challenge with frequent adjustments. At one time he was followed by HF paramedicine but was discharged due poor compliance.    He has been followed in the HF clinic and was last seen 05/2020.  He cancelled follow up appointments 08/2020 and 09/2020.   10/16/20 Hgb A1C > 14. Saw PCP last week and was volume overloaded and discussion about starting insulin. He was reluctant to start due to needle pho  Dr Gilford Rile noted he had not been able to tolerate his diuretics due to running out of his potassium supplement.  Weight at that time 395 pounds. Instructed to follow up with HF team.   Today he presented to ED with abdominal pain + dyspnea + orthopnea.  CXR with cardiomegaly. CT abd- small-mod ascites, sludge in gallbladder, hepatic steatosis, cardiomegaly. Pertinent labs included: hgb 15.9, WBC 8.2,  sodium 125, K 4.7, glucose 251, creatinine 1.6.    Says he is frustrated about being in the ED. Cursing at staff about care.   Cardiac Studies   Echo 10/21: EF 30-35% with moderate RV dysfunction in setting of recurrent RBBB with significant dyssynchrony.  ECHO 04/2019: EF 30-35%  ECHO 2019: EF 55%   Echo EF F 15-20% in [place] in 2017. Cath around that time without any significant CAD per patient.  Review of Systems: [y] = yes, _0  = no   General: Weight gain [ Y]; Weight loss _1 ; Anorexia _2 ; Fatigue [ Y]; Fever _3 ; Chills _4 ; Weakness [ Y]  Cardiac: Chest pain/pressure _5 ; Resting SOB _6 ; Exertional SOB [ Y]; Orthopnea [ Y]; Pedal Edema [Y ]; Palpitations _7 ; Syncope _8 ; Presyncope _9 ; Paroxysmal nocturnal dyspnea_10   Pulmonary: Cough _11 ; Wheezing_12 ; Hemoptysis_13 ; Sputum _14 ; Snoring _15   GI: Vomiting_16 ; Dysphagia_17 ; Melena_18 ; Hematochezia _19 ; Heartburn_20 ; Abdominal pain _21 ; Constipation _22 ; Diarrhea _23 ; BRBPR _24   GU: Hematuria_25 ; Dysuria _26 ; Nocturia_27   Vascular: Pain in legs with walking _28 ; Pain in feet with lying flat _29 ; Non-healing sores _30 ; Stroke _31 ; TIA _32 ; Slurred speech _33 ;  Neuro: Headaches_34 ; Vertigo_35 ; Seizures_36 ; Paresthesias_37 ;Blurred vision _38 ; Diplopia _39 ;  Vision changes _0   Ortho/Skin: Arthritis _1 ; Joint pain [ Y]; Muscle pain _2 ; Joint swelling _3 ; Back Pain [ Y]; Rash _4   Psych: Depression_5 ; Anxiety_6   Heme: Bleeding problems _7 ; Clotting disorders _8 ; Anemia _9   Endocrine: Diabetes [Y ]; Thyroid dysfunction_10   Home Medications Prior to Admission medications   Medication Sig Start Date End Date Taking? Authorizing Provider  albuterol (VENTOLIN HFA) 108 (90 Base) MCG/ACT inhaler Inhale 2 puffs into the lungs every 4 (four) hours as needed for wheezing or shortness of breath. 09/05/20  Yes Padgett, Rae Halsted, MD  azelastine (ASTELIN) 0.1 % nasal spray Place 2 sprays into both nostrils 2 (two) times daily. Use in each  nostril as directed Patient taking differently: Place 2 sprays into both nostrils 2 (two) times daily. 09/05/20  Yes Padgett, Rae Halsted, MD  carvedilol (COREG) 25 MG tablet Take 1 tablet (25 mg total) by mouth 2 (two) times daily. 02/25/20 02/24/21 Yes Clegg, Amy D, NP  digoxin (LANOXIN) 0.125 MG tablet Take 1 tablet (0.125 mg total) by mouth daily. 11/09/19  Yes Armonee Bojanowski, Shaune Pascal, MD  Dulaglutide (TRULICITY) 1.5 FG/1.8EX SOPN Inject 1.5 mg into the skin once a week. 10/17/20  Yes [name], MD  ENTRESTO 97-103 MG TAKE 1 TABLET BY MOUTH 2 (TWO) TIMES DAILY. 05/30/20  Yes Johnathon Olden, Shaune Pascal, MD  fluticasone (FLONASE) 50 MCG/ACT nasal spray Apply 2 sprays each nostril daily for 1-2 weeks Patient taking differently: Place 2 sprays into both nostrils See admin instructions. Apply 2 sprays each nostril daily for 1-2 weeks (for mold allergies) 09/05/20  Yes Padgett, Rae Halsted, MD  gabapentin (NEURONTIN) 300 MG capsule Take 1 capsule (300 mg total) by mouth 3 (three) times daily. Patient taking differently: Take 300 mg by mouth 3 (three) times daily as needed (neuropathy.). 08/13/20 08/13/21 Yes [name], MD  levocetirizine (XYZAL) 5 MG tablet Take 1 tablet (5 mg total) by mouth every evening. 09/05/20  Yes Padgett, Rae Halsted, MD  metolazone (ZAROXOLYN) 2.5 MG tablet TAKE 1 TAB BY MOUTH DAILY AS NEEDED. AS DIRECTED BY HF CLINIC FOR WEIGHT GAIN OF 3 LBS OVERNIGHT OR 5 LBS WITHIN ONE WEEK. 10/14/20  Yes Tongela Encinas, Shaune Pascal, MD  nystatin powder Apply 1 application topically 3 (three) times daily. Patient taking differently: Apply 1 application topically 3 (three) times daily as needed (rash). 10/16/20  Yes [name], MD  ondansetron (ZOFRAN) 4 MG tablet Take 1 tablet (4 mg total) by mouth daily as needed for nausea or vomiting. 10/16/20 10/16/21 Yes [name], MD  potassium chloride SA (KLOR-CON M20) 20 MEQ tablet TAKE 2 TABLETS BY MOUTH AS NEEDED. WHEN YOU TAKE  METOLAZONE Patient taking differently: TAKE 2 TABLETS BY MOUTH AS NEEDED FOR CRAMPS WHEN YOU TAKE METOLAZONE 10/16/20  Yes [name], MD  spironolactone (ALDACTONE) 25 MG tablet TAKE 1 TABLET BY MOUTH EVERY DAY Patient taking differently: Take 25 mg by mouth daily. 09/08/20  Yes Rosita Fire, Brittainy M, PA-C  torsemide (DEMADEX) 20 MG tablet Take 3 tablets (60 mg total) by mouth daily. Patient taking differently: Take 60 mg by mouth daily as needed. 05/01/20  Yes Lyda Jester M, PA-C  Accu-Chek Softclix Lancets lancets Check blood sugar 3x a day Patient taking differently: 1 each by Other route See admin instructions. Check blood sugar 3x a day 06/23/20   Asencion Noble, MD  blood glucose meter kit and supplies KIT Dispense based on patient and insurance preference.  Use up to four times daily as directed. (FOR ICD-9 250.00, 250.01). For QAC - HS accuchecks. May switch to any brand. Patient taking differently: Inject 1 each into the skin See admin instructions. Dispense based on patient and insurance preference. Use up to four times daily as directed. (FOR ICD-9 250.00, 250.01). For QAC - HS accuchecks. May switch to any brand. 10/29/16   Thurnell Lose, MD  Blood Glucose Monitoring Suppl (ACCU-CHEK GUIDE) w/Device KIT Check blood sugar 3 times a day Patient taking differently: 1 each by Other route See admin instructions. Check blood sugar 3 times a day 06/23/20   [name], MD  fluconazole (DIFLUCAN) 100 MG tablet Take 1.5 tablets (150 mg total) by mouth daily. Patient not taking: Reported on 10/28/2020 10/16/20   [name], MD  glucose blood (ACCU-CHEK GUIDE) test strip Check blood sugar 3 times per day Patient taking differently: 1 each by Other route See admin instructions. Check blood sugar 3 times per day 06/23/20   [name], MD  guaiFENesin-codeine 100-10 MG/5ML syrup Take 5 mLs by mouth at bedtime. This will make you sleepy, do not take and drive Patient not taking: No  sig reported 08/21/20   Riesa Pope, MD  Olopatadine HCl 0.2 % SOLN Apply 1 drop to eye daily as needed. Patient not taking: No sig reported 09/05/20   Kennith Gain, MD  ondansetron (ZOFRAN ODT) 4 MG disintegrating tablet Take 1 tablet (4 mg total) by mouth every 8 (eight) hours as needed for nausea or vomiting. Patient not taking: No sig reported 09/11/20   Shary Key, DO    Past Medical History: Past Medical History:  Diagnosis Date   Acute pain of left foot 05/03/2017   Asthma    Asthma, chronic, mild persistent, uncomplicated 77/82/4235   Charcot ankle, left 05/31/2017   CHF (congestive heart failure) (HCC)    Chronic systolic (congestive) heart failure ([place])    Diabetes mellitus type 2 in obese ([place]) 04/21/2014   Diabetic polyneuropathy associated with type 2 diabetes mellitus ([place]) 03/14/2017   Essential hypertension 10/26/2016   Hypertension    Obesity    Obesity, Class III, BMI 40-49.9 (morbid obesity) ([place]) 10/26/2016   OSA (obstructive sleep apnea) 12/02/2016   Type 2 diabetes mellitus with diabetic neuropathy (HCC)     Past Surgical History: Past Surgical History:  Procedure Laterality Date   CARDIAC CATHETERIZATION     FOOT SURGERY     arch surgery    Family History: Family History  Problem Relation Age of Onset   Diabetes Mellitus II Mother    Heart failure Mother    Hypertension Mother    Heart disease Mother    Heart failure Father    Kidney disease Father    Heart disease Father    Congestive Heart Failure Neg Hx     Social History: Social History   Socioeconomic History   Marital status: Married    Spouse name: Not on file   Number of children: Not on file   Years of education: Not on file   Highest education level: Not on file  Occupational History   Not on file  Tobacco Use   Smoking status: Never   Smokeless tobacco: Never  Vaping Use   Vaping Use: Never used  Substance and Sexual Activity   Alcohol use: No    Drug use: No   Sexual activity: Never  Other Topics Concern   Not on file  Social History Narrative  Not on file   Social Determinants of Health   Financial Resource Strain: Medium Risk   Difficulty of Paying Living Expenses: Somewhat hard  Food Insecurity: No Food Insecurity   Worried About Running Out of Food in the Last Year: Never true   Ran Out of Food in the Last Year: Never true  Transportation Needs: No Transportation Needs   Lack of Transportation (Medical): No   Lack of Transportation (Non-Medical): No  Physical Activity: Not on file  Stress: Not on file  Social Connections: Not on file    Allergies:  Allergies  Allergen Reactions   Hydrocodone     Hives     Objective:    Vital Signs:   Temp:  [96.8 F (36 C)-98.4 F (36.9 C)] 97.7 F (36.5 C) (09/13 0915) Pulse Rate:  [84-120] 120 (09/13 1051) Resp:  [15-20] 16 (09/13 1051) BP: (110-162)/(82-137) 162/137 (09/13 1051) SpO2:  [95 %-100 %] 100 % (09/13 1051)    Weight change: There were no vitals filed for this visit.  Intake/Output:  No intake or output data in the 24 hours ending 10/28/20 1254    Physical Exam    General:  Sitting on the side of the stretcher. No resp difficulty HEENT: normal Neck: supple. JVP to jaw  . Carotids 2+ bilat; no bruits. No lymphadenopathy or thyromegaly appreciated. Cor: PMI nondisplaced. Tachy regular rate & rhythm. No rubs, or murmurs. + S3  Lungs: clear Abdomen: obese, soft, tender, nondistended. No hepatosplenomegaly. No bruits or masses. Good bowel sounds. Extremities: no cyanosis, clubbing, rash, R and LLE woody edema extending to abdomen Neuro: alert & orientedx3, cranial nerves grossly intact. moves all 4 extremities w/o difficulty. Affect pleasant   Telemetry   Sinus Tach 120s   EKG  Sinus tach 120 bpm personally reviewed.   Labs   Basic Metabolic Panel: Recent Labs  Lab 10/28/20 0313  NA 125*  K 4.7  CL 90*  CO2 21*  GLUCOSE 251*  BUN  19  CREATININE 1.63*  CALCIUM 8.9    Liver Function Tests: No results for input(s): AST, ALT, ALKPHOS, BILITOT, PROT, ALBUMIN in the last 168 hours. No results for input(s): LIPASE, AMYLASE in the last 168 hours. No results for input(s): AMMONIA in the last 168 hours.  CBC: Recent Labs  Lab 10/28/20 0313  WBC 8.2  NEUTROABS 4.3  HGB 15.9  HCT 50.1  MCV 95.8  PLT 261    Cardiac Enzymes: No results for input(s): CKTOTAL, CKMB, CKMBINDEX, TROPONINI in the last 168 hours.  BNP: BNP (last 3 results) Recent Labs    01/17/20 1405 05/01/20 1020 09/11/20 0951  BNP 821.7* 862.1* 803.3*    ProBNP (last 3 results) No results for input(s): PROBNP in the last 8760 hours.   CBG: No results for input(s): GLUCAP in the last 168 hours.  Coagulation Studies: No results for input(s): LABPROT, INR in the last 72 hours.   Imaging   CT ABDOMEN PELVIS W CONTRAST  Result Date: 10/28/2020 CLINICAL DATA:  Left lower quadrant abdominal pain, groin pain, nausea for 3 days EXAM: CT ABDOMEN AND PELVIS WITH CONTRAST TECHNIQUE: Multidetector CT imaging of the abdomen and pelvis was performed using the standard protocol following bolus administration of intravenous contrast. CONTRAST:  174m OMNIPAQUE IOHEXOL 350 MG/ML SOLN COMPARISON:  None. FINDINGS: Lower chest: There is mosaic attenuation in the lung bases. There is no focal consolidation or pleural effusion. The heart is enlarged. There is reflux of contrast into the IVC and  hepatic veins. Hepatobiliary: The liver is diffusely hypoattenuating likely reflecting fatty infiltration. Layering hyperdense material in the gallbladder likely reflects sludge. There is mild pericholecystic stranding without appreciable wall thickening. There is no biliary ductal dilatation. Pancreas: The pancreas is atrophic. There are no focal lesions or contour abnormalities. There is no main pancreatic ductal dilatation or peripancreatic inflammatory change. Spleen:  Unremarkable. Adrenals/Urinary Tract: The adrenals are unremarkable. The kidneys are symmetrically hypoenhancing, suspected due to decreased cardiac output. There are no focal lesions or stones. There is no hydronephrosis or hydroureter. The bladder is decompressed but grossly unremarkable. Stomach/Bowel: The stomach is unremarkable. There is no evidence of bowel obstruction. There is no abnormal bowel wall thickening or inflammatory change. The appendix is normal. There is a mild stool burden in the right colon extending to proximal transverse colon. Vascular/Lymphatic: The abdominal aorta is nonaneurysmal. The venous structures are not evaluated due to contrast bolus timing. There is no abdominal or pelvic lymphadenopathy. Reproductive: The prostate and seminal vesicles are unremarkable. Other: There is small to moderate volume ascites. There is no organized drainable fluid collection in the abdomen or pelvis. There is no free intraperitoneal air. Musculoskeletal: There is intervertebral disc space narrowing with vacuum disc phenomenon at L4-L5 and L5-S1. There is no acute osseous abnormality or aggressive osseous lesion. IMPRESSION: 1. Small to moderate volume ascites. No organized or drainable fluid collection in the abdomen or pelvis. 2. Layering sludge in the gallbladder with mild pericholecystic fat stranding but no appreciable wall thickening. Correlate with history and exam, and consider right upper quadrant ultrasound or HIDA scan if there is clinical concern for acute cholecystitis. 3. Hepatic steatosis. 4. Cardiomegaly with evidence of right heart dysfunction as above. 5. Mosaic attenuation in the lung bases suggests air trapping/small airway disease. Electronically Signed   By: Valetta Mole M.D.   On: 10/28/2020 08:57   DG Chest Portable 1 View  Result Date: 10/28/2020 CLINICAL DATA:  Shortness of breath, tachycardia EXAM: PORTABLE CHEST 1 VIEW COMPARISON:  09/11/2020 FINDINGS: Cardiomegaly.  Lingular scarring or atelectasis. Right lung clear. No effusions or acute bony abnormality. IMPRESSION: Cardiomegaly. Lingular scarring or atelectasis. No active disease. Electronically Signed   By: Rolm Baptise M.D.   On: 10/28/2020 11:34   [place] EKG SITE RITE  Result Date: 10/28/2020 If Site Rite image not attached, placement could not be confirmed due to current cardiac rhythm.    Medications:     Current Medications:  furosemide  80 mg Intravenous Once    Infusions:  furosemide (LASIX) 200 mg in dextrose 5% 100 mL (57m/mL) infusion     sodium chloride        Patient Profile   Jonathan [name] 35year old with a history of chronic HFrEF, NICM, HTN, asthma, OSA, morbid obesity, and uncontrolled DM.   Admitting with A/C HFrEF + uncontrolled DM. Marked volume overload possible low output.   Assessment/Plan   A/C HFrEF, NICM  -Most recent ECHO back in 2021 EF 30-35 % with moderate RV dysfunction in the setting of recurrent RBBB with significant dyssynchrony. Repeat ECHO  -Marked volume overload. Given 80 mg IV lasix and start lasix drip at 10 mg per hour. -Place PICC to check CVP and CO-OX. If CO-OX low will need to add milrinone to augment diuresis. If inotropes started this would be short term only.    -No bb with possible low output.  - Intolerant SGLT2i due to candidiasis -Not a candidate for advanced therapies with Hgb A1C >14  and noncompliance.   2. Uncontrolled DM -9/1 Hgb A1C > 14.  -Per primary team.   3. AKI -Baseline creatinine < 1.  - Creatinine 1.6 today. As above concerned this is low output heart failure.  - May need inotropes as noted above.   4. Sinus Tachycardia  ? Possible low output heart failure.   5. Abdominal Pain  -CT with sludge in gall bladder.  -? Low output heart failure   6. Hyponatremia  -Sodium 125 - Restrict free water.  - give dose of tolvaptan now   7. OSA Severe OSA AHI 65 - Verify he has Bipap   8. Morbid Obesity   9. H/O  medication noncompliance   Medication concerns reviewed with patient and pharmacy team. Barriers identified: Yes. Will need HF Paramedicine if agreeable.  Length of Stay: 0  Darrick Grinder, NP  10/28/2020, 12:54 PM  Advanced Heart Failure Team Pager [phone] (M-F; 7a - 5p)  Please contact [facility] for night-coverage after hours (4p -7a ) and weekends on [email or web address]  Patient seen and examined with the above-signed Advanced Practice Provider and/or Housestaff. I personally reviewed laboratory data, imaging studies and relevant notes. I independently examined the patient and formulated the important aspects of the plan. I have edited the note to reflect any of my changes or salient points. I have personally discussed the plan with the patient and/or family.  35 y/o morbidly obese male with severe HF due to [facility] with biventricular dysfunction, poorly controlled DM2 presents to ER with ADHF and svere hyperglycemia.   Reports symptoms have been progressive over past few months.   In ER tachycardic, with AKI and hyponatremia. BNP 1547.   Echo reviewed personally EF ~15% with at least moderately reduced RV dysfunction.   General:  Sitting up on side of bed. No resp difficulty HEENT: normal Neck: supple. JVP to ear. Carotids 2+ bilat; no bruits. No lymphadenopathy or thryomegaly appreciated. Cor: PMI nonpalpable. Regular tachy + s3 Lungs: + crackles Abdomen: obese soft, nontender, nondistended. No hepatosplenomegaly. No bruits or masses. Good bowel sounds. Extremities: no cyanosis, clubbing, rash, 3+ edema cool Neuro: alert & orientedx3, cranial nerves grossly intact. moves all 4 extremities w/o difficulty. Affect pleasant  Suspect low output HF in setting of severe biventricular HF with R>L symptoms. He has been extremely non-complaint with f/u and management of his health and in fact nearly left the ER today AMA.  Will start digoxin, IV lasix and empiric milrinone. Place PICC to follow  co-ox and CVP. No spiro yet with hyperkalemia. Not candidate for advanced therapies with Hgb a1c > 14 and non-compliance.   Glori Bickers, MD  10:11 PM

## 2020-10-28 NOTE — ED Notes (Signed)
I spoke with admitting physician on phone. He asked me if pt had received insulin yet. I told him that pt is refusing all care, which is why I paged him and would like to leave AMA. I told him that the patient did agree to let me check his sugar recently and it was 201. Doctor said he was going to change his insulin dose. I told him that he also refused the picc line. I spoke with patient and spouse and pt agreed to let me give him the insulin once the doctor changed it, but they would like to speak with the physician (said he was coming down to speak with them).

## 2020-10-28 NOTE — Progress Notes (Signed)
Patient refused PICC placement.Patient wants to go home.RN aware .

## 2020-10-28 NOTE — ED Notes (Signed)
Pt. Refusing blood draw, refusing covid swab, and refusing fluids. MD made aware.

## 2020-10-28 NOTE — H&P (Signed)
Date: 10/28/2020               Patient Name:  Jonathan [name] MRN: [id number]  DOB: 04/08/1985 Age / Sex: 35 y.o., male   PCP: [name], MD         Medical Service: Internal Medicine Teaching Service         Attending Physician: Dr. Velna Ochs, MD    First Contact: Farrel Gordon, DO Pager: ED [phone]  Second Contact: Rick Duff, MD Pager: [facility] [phone]       After Hours (After 5p/  First Contact Pager: [phone]  weekends / holidays): Second Contact Pager: [phone]   SUBJECTIVE  Chief Complaint: Shortness of breath  History of Present Illness: Jonathan [name] is a 35 y.o. male with a pertinent PMH of HFrEF, diabetes, hypertension, OSA,, who presents to [facility] with progression of the shortness of breath.  Patient states that he has been having worsening SHOB over the last several weeks to months.  He has associated orthopnea, abdominal bloating, decreased exercise tolerance, and lower extremity edema.  He states that he is on torsemide 20 mg 3 times daily and metolazone 2.5 mg daily, but is unable to tolerate this regimen daily. Therefore, he will take these meds 2-3 times weekly.  His last echo was performed on 12/03/2019 with an EF of 30 to 35%, global hypokinesis, moderately due to right ventricular function, mildly enlarged right ventricle, mildly enlarged left and right atria.  He also endorses left lower quadrant abdominal pain with associated nausea.  He denies any decreased appetite, hematemesis, melena, or hematochezia.  He does endorse oliguria particular the last 2 to 3 days.  He denies any systemic signs of infection, chest pain, palpitations, presyncope.   Medications: No current facility-administered medications on file prior to encounter.   Current Outpatient Medications on File Prior to Encounter  Medication Sig Dispense Refill   albuterol (VENTOLIN HFA) 108 (90 Base) MCG/ACT inhaler Inhale 2 puffs into the lungs every 4 (four) hours as needed for wheezing or  shortness of breath. 18 g 1   azelastine (ASTELIN) 0.1 % nasal spray Place 2 sprays into both nostrils 2 (two) times daily. Use in each nostril as directed (Patient taking differently: Place 2 sprays into both nostrils 2 (two) times daily.) 30 mL 5   carvedilol (COREG) 25 MG tablet Take 1 tablet (25 mg total) by mouth 2 (two) times daily. 60 tablet 6   digoxin (LANOXIN) 0.125 MG tablet Take 1 tablet (0.125 mg total) by mouth daily. 90 tablet 3   Dulaglutide (TRULICITY) 1.5 BM/8.4XL SOPN Inject 1.5 mg into the skin once a week. 2 mL 2   ENTRESTO 97-103 MG TAKE 1 TABLET BY MOUTH 2 (TWO) TIMES DAILY. 60 tablet 6   fluticasone (FLONASE) 50 MCG/ACT nasal spray Apply 2 sprays each nostril daily for 1-2 weeks (Patient taking differently: Place 2 sprays into both nostrils See admin instructions. Apply 2 sprays each nostril daily for 1-2 weeks (for mold allergies)) 16 g 3   gabapentin (NEURONTIN) 300 MG capsule Take 1 capsule (300 mg total) by mouth 3 (three) times daily. (Patient taking differently: Take 300 mg by mouth 3 (three) times daily as needed (neuropathy.).) 90 capsule 2   levocetirizine (XYZAL) 5 MG tablet Take 1 tablet (5 mg total) by mouth every evening. 30 tablet 5   metolazone (ZAROXOLYN) 2.5 MG tablet TAKE 1 TAB BY MOUTH DAILY AS NEEDED. AS DIRECTED BY HF CLINIC FOR WEIGHT GAIN OF 3 LBS OVERNIGHT  OR 5 LBS WITHIN ONE WEEK. 10 tablet 2   nystatin powder Apply 1 application topically 3 (three) times daily. (Patient taking differently: Apply 1 application topically 3 (three) times daily as needed (rash).) 15 g 0   ondansetron (ZOFRAN) 4 MG tablet Take 1 tablet (4 mg total) by mouth daily as needed for nausea or vomiting. 30 tablet 1   potassium chloride SA (KLOR-CON M20) 20 MEQ tablet TAKE 2 TABLETS BY MOUTH AS NEEDED. WHEN YOU TAKE METOLAZONE (Patient taking differently: TAKE 2 TABLETS BY MOUTH AS NEEDED FOR CRAMPS WHEN YOU TAKE METOLAZONE) 60 tablet 3   spironolactone (ALDACTONE) 25 MG tablet TAKE  1 TABLET BY MOUTH EVERY DAY (Patient taking differently: Take 25 mg by mouth daily.) 90 tablet 3   torsemide (DEMADEX) 20 MG tablet Take 3 tablets (60 mg total) by mouth daily. (Patient taking differently: Take 60 mg by mouth daily as needed.) 90 tablet 3   Accu-Chek Softclix Lancets lancets Check blood sugar 3x a day (Patient taking differently: 1 each by Other route See admin instructions. Check blood sugar 3x a day) 100 each 11   blood glucose meter kit and supplies KIT Dispense based on patient and insurance preference. Use up to four times daily as directed. (FOR ICD-9 250.00, 250.01). For QAC - HS accuchecks. May switch to any brand. (Patient taking differently: Inject 1 each into the skin See admin instructions. Dispense based on patient and insurance preference. Use up to four times daily as directed. (FOR ICD-9 250.00, 250.01). For QAC - HS accuchecks. May switch to any brand.) 1 each 1   Blood Glucose Monitoring Suppl (ACCU-CHEK GUIDE) w/Device KIT Check blood sugar 3 times a day (Patient taking differently: 1 each by Other route See admin instructions. Check blood sugar 3 times a day) 1 kit 1   fluconazole (DIFLUCAN) 100 MG tablet Take 1.5 tablets (150 mg total) by mouth daily. (Patient not taking: Reported on 10/28/2020) 1.5 tablet 0   glucose blood (ACCU-CHEK GUIDE) test strip Check blood sugar 3 times per day (Patient taking differently: 1 each by Other route See admin instructions. Check blood sugar 3 times per day) 100 each 11   guaiFENesin-codeine 100-10 MG/5ML syrup Take 5 mLs by mouth at bedtime. This will make you sleepy, do not take and drive (Patient not taking: No sig reported) 118 mL 0   Olopatadine HCl 0.2 % SOLN Apply 1 drop to eye daily as needed. (Patient not taking: No sig reported) 2.5 mL 5   ondansetron (ZOFRAN ODT) 4 MG disintegrating tablet Take 1 tablet (4 mg total) by mouth every 8 (eight) hours as needed for nausea or vomiting. (Patient not taking: No sig reported) 4  tablet 0    Past Medical History:  Past Medical History:  Diagnosis Date   Acute pain of left foot 05/03/2017   Asthma    Asthma, chronic, mild persistent, uncomplicated 79/39/0300   Charcot ankle, left 05/31/2017   CHF (congestive heart failure) (HCC)    Chronic systolic (congestive) heart failure (HCC)    Diabetes mellitus type 2 in obese ([place]) 04/21/2014   Diabetic polyneuropathy associated with type 2 diabetes mellitus ([place]) 03/14/2017   Essential hypertension 10/26/2016   Hypertension    Obesity    Obesity, Class III, BMI 40-49.9 (morbid obesity) ([place]) 10/26/2016   OSA (obstructive sleep apnea) 12/02/2016   Type 2 diabetes mellitus with diabetic neuropathy [facility])     Social:  Lives -[place] Occupation -unknown Support -wife Level of function -  independant PCP -Dr. Gilford Rile Substance use -none  Family History: Family History  Problem Relation Age of Onset   Diabetes Mellitus II Mother    Heart failure Mother    Hypertension Mother    Heart disease Mother    Heart failure Father    Kidney disease Father    Heart disease Father    Congestive Heart Failure Neg Hx     Allergies: Allergies as of 10/28/2020 - Review Complete 10/28/2020  Allergen Reaction Noted   Hydrocodone  01/03/2012    Review of Systems: A complete ROS was negative except as per HPI.   OBJECTIVE:  Physical Exam: Blood pressure 134/90, pulse (!) 117, temperature 97.8 F (36.6 C), temperature source Oral, resp. rate (!) 28, SpO2 95 %. Physical Exam Constitutional:      Appearance: He is obese.  HENT:     Head: Normocephalic and atraumatic.  Cardiovascular:     Rate and Rhythm: Regular rhythm. Tachycardia present.     Pulses: Normal pulses.     Heart sounds:    Gallop (S3) present.  Pulmonary:     Comments: Decreased breath sounds and tachypnea with movement Abdominal:     General: There is distension.     Tenderness: There is abdominal tenderness (LLQ tenderness).   Musculoskeletal:        General: Swelling (bilateral 3+ pitting edema to his hips) present.  Skin:    Comments: Lower extremity are cool    Neurological:     General: No focal deficit present.     Mental Status: He is alert and oriented to person, place, and time.    Pertinent Labs: CBC    Component Value Date/Time   WBC 8.2 10/28/2020 0313   RBC 5.23 10/28/2020 0313   HGB 18.7 (H) 10/28/2020 1330   HCT 55.0 (H) 10/28/2020 1330   PLT 261 10/28/2020 0313   MCV 95.8 10/28/2020 0313   MCH 30.4 10/28/2020 0313   MCHC 31.7 10/28/2020 0313   RDW 14.6 10/28/2020 0313   LYMPHSABS 3.0 10/28/2020 0313   MONOABS 0.8 10/28/2020 0313   EOSABS 0.1 10/28/2020 0313   BASOSABS 0.0 10/28/2020 0313     CMP     Component Value Date/Time   NA 127 (L) 10/28/2020 1330   NA 138 12/08/2018 1018   K 5.0 10/28/2020 1330   CL 90 (L) 10/28/2020 0313   CO2 21 (L) 10/28/2020 0313   GLUCOSE 251 (H) 10/28/2020 0313   BUN 19 10/28/2020 0313   BUN 10 12/08/2018 1018   CREATININE 1.63 (H) 10/28/2020 0313   CALCIUM 8.9 10/28/2020 0313   PROT 7.4 07/24/2017 2214   ALBUMIN 3.9 07/24/2017 2214   AST 27 07/24/2017 2214   ALT 15 (L) 07/24/2017 2214   ALKPHOS 40 07/24/2017 2214   BILITOT 1.5 (H) 07/24/2017 2214   GFRNONAA 56 (L) 10/28/2020 0313   GFRAA >60 11/09/2019 1452    Pertinent Imaging: CT ABDOMEN PELVIS W CONTRAST  Result Date: 10/28/2020 CLINICAL DATA:  Left lower quadrant abdominal pain, groin pain, nausea for 3 days EXAM: CT ABDOMEN AND PELVIS WITH CONTRAST TECHNIQUE: Multidetector CT imaging of the abdomen and pelvis was performed using the standard protocol following bolus administration of intravenous contrast. CONTRAST:  148mL OMNIPAQUE IOHEXOL 350 MG/ML SOLN COMPARISON:  None. FINDINGS: Lower chest: There is mosaic attenuation in the lung bases. There is no focal consolidation or pleural effusion. The heart is enlarged. There is reflux of contrast into the IVC and hepatic veins.  Hepatobiliary: The liver is diffusely hypoattenuating likely reflecting fatty infiltration. Layering hyperdense material in the gallbladder likely reflects sludge. There is mild pericholecystic stranding without appreciable wall thickening. There is no biliary ductal dilatation. Pancreas: The pancreas is atrophic. There are no focal lesions or contour abnormalities. There is no main pancreatic ductal dilatation or peripancreatic inflammatory change. Spleen: Unremarkable. Adrenals/Urinary Tract: The adrenals are unremarkable. The kidneys are symmetrically hypoenhancing, suspected due to decreased cardiac output. There are no focal lesions or stones. There is no hydronephrosis or hydroureter. The bladder is decompressed but grossly unremarkable. Stomach/Bowel: The stomach is unremarkable. There is no evidence of bowel obstruction. There is no abnormal bowel wall thickening or inflammatory change. The appendix is normal. There is a mild stool burden in the right colon extending to proximal transverse colon. Vascular/Lymphatic: The abdominal aorta is nonaneurysmal. The venous structures are not evaluated due to contrast bolus timing. There is no abdominal or pelvic lymphadenopathy. Reproductive: The prostate and seminal vesicles are unremarkable. Other: There is small to moderate volume ascites. There is no organized drainable fluid collection in the abdomen or pelvis. There is no free intraperitoneal air. Musculoskeletal: There is intervertebral disc space narrowing with vacuum disc phenomenon at L4-L5 and L5-S1. There is no acute osseous abnormality or aggressive osseous lesion. IMPRESSION: 1. Small to moderate volume ascites. No organized or drainable fluid collection in the abdomen or pelvis. 2. Layering sludge in the gallbladder with mild pericholecystic fat stranding but no appreciable wall thickening. Correlate with history and exam, and consider right upper quadrant ultrasound or HIDA scan if there is clinical  concern for acute cholecystitis. 3. Hepatic steatosis. 4. Cardiomegaly with evidence of right heart dysfunction as above. 5. Mosaic attenuation in the lung bases suggests air trapping/small airway disease. Electronically Signed   By: Valetta Mole M.D.   On: 10/28/2020 08:57   DG Chest Portable 1 View  Result Date: 10/28/2020 CLINICAL DATA:  Shortness of breath, tachycardia EXAM: PORTABLE CHEST 1 VIEW COMPARISON:  09/11/2020 FINDINGS: Cardiomegaly. Lingular scarring or atelectasis. Right lung clear. No effusions or acute bony abnormality. IMPRESSION: Cardiomegaly. Lingular scarring or atelectasis. No active disease. Electronically Signed   By: Rolm Baptise M.D.   On: 10/28/2020 11:34   [place] EKG SITE RITE  Result Date: 10/28/2020 If Site Rite image not attached, placement could not be confirmed due to current cardiac rhythm.   EKG: personally reviewed my interpretation is sinus with RBBB  ASSESSMENT & PLAN:  Assessment: Active Problems:   Diabetes mellitus type 2 in obese (HCC)   Essential hypertension   OSA (obstructive sleep apnea)   Acute exacerbation of CHF (congestive heart failure) (HCC)   Jonathan [name] is a 35 y.o. with pertinent PMH of HFrEF, diabetes, hypertension, OSA, who presented with shortness of breath and admit for heart failure exacerbation complicated by uncontrolled diabetes on hospital day 0  Plan: #Acute on chronic HFrEF Presents with significantly uncontrolled HFrEF secondary to dietary and medication nonadherence.  Patient has signs and symptoms of poor perfusion including AKI and cool extremities. His BMP is elevated at 1,547 and his CT abdomen shows ascites. There is concern for congestive hepatopathy with CMP pending. Advanced heart failure team was consulted for further evaluation and management.  He will need PICC placement with CVP and Co-ox measurement.  Additionally, he will likely need milrinone for improved in addition to IV Lasix.  We will hold off on Entresto  and spironolactone in the setting of - IV Lasix gtt - PICC line  placement  - Measure CVP and CO-ox - Goal K > 4 and Mg > 2 - CMP pending -We will restart GDMT when tolerable  #Diabetes Mellitus, Type 2: Patient has a history of poorly controlled type 2 diabetes. His last a1c was >52.1 on Trulicity 1.5 mg. His blood sugars have been elevated here between 201-350.  There is no signs of DKA on labs. -CBG monitoring QID with meals.  - SSI with meals. - Lantus 10 units daily. Likely will need to increase does.  - Will try to utilize flood CGM if possible -Beta hydroxybutyrate pending  #AKI Patient presents with acute increasing creatinine at 1.63 from a baseline of 0.93 in the setting of acute heart failure exacerbation. He has expressed decreased urine output over the last week concern for oliguria. -Avoid nephrotoxic medications -Strict ins and outs -IV Lasix and inotrope   #HTN: #HLD: Patient has a history of hypertension on carvedilol 25 mg twice daily Entresto 97-103 mg twice daily and spironolactone 25 mg daily.  -We will slowly add these medications back as patient is able to tolerate them.  #OSA Patient has a history of OSA with a significantly elevated AHI at 65.  He would likely benefit from CPAP versus BiPAP  #Hyponatremia Patient has a hyponatremia of 125 which corrects to 127 due to his hyperglycemia.  Patient's hyponatremia likely secondary to significant volume overload in the setting of acute decompensated heart failure.  This will with diuresis.  Best Practice: Diet: Cardiac diet IVF: none VTE: enoxaparin (LOVENOX) injection 40 mg Start: 10/28/20 1800 Code: Full AB: none Status: Inpatient with expected length of stay greater than 2 midnights. Anticipated Discharge Location:  TBD Barriers to Discharge: Medical stability  Signature: Lawerance Cruel, D.O.  Internal Medicine Resident, PGY-3 [facility]  Pager: [phone] 7:05 PM,  10/28/2020   Please contact the on call pager after 5 pm and on weekends at [phone].

## 2020-10-28 NOTE — ED Triage Notes (Signed)
Alert and oriented x4 complaining of constipation for the last 3 days, has been retaining fluid, edema in the lower extremities, for the last week

## 2020-10-28 NOTE — ED Provider Notes (Signed)
Emergency Medicine Provider Triage Evaluation Note  Jonathan [name] , a 35 y.o. male  was evaluated in triage.  Pt complains of LLQ abdominal pain x 3 days. Constant and associated with constipation. Did have small BM yesterday. Passing some flatus. Notes nausea without vomiting. No urinary symptoms. Hx of heart failure and retaining fluid, but has had a recent 10 pound weight loss. No prior abdominal surgeries.  Review of Systems  Positive: Abdominal pain, nausea, constipation Negative: Fever, vomiting  Physical Exam  BP (!) 131/91 (BP Location: Left Arm)   Pulse 84   Temp (!) 96.8 F (36 C)   Resp 20   SpO2 95%  Gen:   Awake, no distress   Resp:  Normal effort  MSK:   Moves extremities without difficulty  Other:  Morbidly obese abdomen with focal TTP in the LLQ, nearing the midline.  Medical Decision Making  Medically screening exam initiated at 3:08 AM.  Appropriate orders placed.  Jonathan [name] was informed that the remainder of the evaluation will be completed by another provider, this initial triage assessment does not replace that evaluation, and the importance of remaining in the ED until their evaluation is complete.  Abdominal pain   Antony Madura, PA-C 10/28/20 0310    Zadie Rhine, MD 10/28/20 0730

## 2020-10-28 NOTE — ED Notes (Signed)
Pt refused PICC line (team at bedside to get consent).

## 2020-10-28 NOTE — ED Notes (Signed)
I attempted to obtain labs from IV. Will wait until PICC line.

## 2020-10-28 NOTE — ED Provider Notes (Signed)
[place] EMERGENCY DEPARTMENT Provider Note   CSN: [id number] Arrival date & time: 10/28/20  0257     History Chief Complaint  Patient presents with   Abdominal Pain    Complaining of abdominal pain that started 3 days ago, sharp pain in the lower left side that woke him up in a sweat.    Jonathan [name] is a 35 y.o. male.  HPI  Patient with history of type 2 diabetes, asthma, systolic heart failure, morbid obesity presents with left lower quadrant abdominal pain.  Pain started 3 weeks ago, is been constant since then. Does not radiate, feels like a cramping pain. There is associated nausea but no vomiting.  Patient reports feeling constipated, last bowel movement was yesterday.  He is having flatus.  Patient states last night he was woken up by the pain in his left lower quadrant and was diaphoretic.  No fevers at home.  Reports he has not eating and drinking well at home. No previous abdominal surgeries.  Patient reports feeling somewhat short of breath, but denies any associated chest pain.  He lost about 10 pounds of fluids in the last week.  He is still retaining fluids and having pitting edema bilaterally.   Past Medical History:  Diagnosis Date   Acute pain of left foot 05/03/2017   Asthma    Asthma, chronic, mild persistent, uncomplicated 26/94/8546   Charcot ankle, left 05/31/2017   CHF (congestive heart failure) (HCC)    Chronic systolic (congestive) heart failure ([place])    Diabetes mellitus type 2 in obese ([place]) 04/21/2014   Diabetic polyneuropathy associated with type 2 diabetes mellitus ([place]) 03/14/2017   Essential hypertension 10/26/2016   Hypertension    Obesity    Obesity, Class III, BMI 40-49.9 (morbid obesity) ([place]) 10/26/2016   OSA (obstructive sleep apnea) 12/02/2016   Type 2 diabetes mellitus with diabetic neuropathy [facility])     Patient Active Problem List   Diagnosis Date Noted   Intertrigo 10/17/2020   Abdominal pain 08/20/2020   Mold  exposure 06/17/2020   Candidiasis of penis 12/08/2018   Depression 11/06/2017   Charcot ankle, left 05/31/2017   Acute pain of left foot 05/03/2017   Diabetic polyneuropathy associated with type 2 diabetes mellitus ([place]) 03/14/2017   OSA (obstructive sleep apnea) 27/04/5007   Chronic systolic (congestive) heart failure ([place])    Morbid obesity with BMI of 50.0-59.9, adult ([place]) 10/26/2016   Asthma, chronic, mild persistent, uncomplicated 38/18/2993   Essential hypertension 10/26/2016   Diabetes mellitus type 2 in obese ([place]) 04/20/2016    Past Surgical History:  Procedure Laterality Date   CARDIAC CATHETERIZATION     FOOT SURGERY     arch surgery       Family History  Problem Relation Age of Onset   Diabetes Mellitus II Mother    Heart failure Mother    Hypertension Mother    Heart disease Mother    Heart failure Father    Kidney disease Father    Heart disease Father    Congestive Heart Failure Neg Hx     Social History   Tobacco Use   Smoking status: Never   Smokeless tobacco: Never  Vaping Use   Vaping Use: Never used  Substance Use Topics   Alcohol use: No   Drug use: No    Home Medications Prior to Admission medications   Medication Sig Start Date End Date Taking? Authorizing Provider  Accu-Chek Softclix Lancets lancets Check blood sugar 3x  a day Patient taking differently: 1 each by Other route See admin instructions. Check blood sugar 3x a day 06/23/20   Asencion Noble, MD  albuterol (VENTOLIN HFA) 108 (90 Base) MCG/ACT inhaler Inhale 2 puffs into the lungs every 4 (four) hours as needed for wheezing or shortness of breath. 09/05/20   Padgett, Rae Halsted, MD  azelastine (ASTELIN) 0.1 % nasal spray Place 2 sprays into both nostrils 2 (two) times daily. Use in each nostril as directed Patient taking differently: Place 2 sprays into both nostrils 2 (two) times daily. 09/05/20   Kennith Gain, MD  blood glucose meter kit and supplies KIT  Dispense based on patient and insurance preference. Use up to four times daily as directed. (FOR ICD-9 250.00, 250.01). For QAC - HS accuchecks. May switch to any brand. Patient taking differently: Inject 1 each into the skin See admin instructions. Dispense based on patient and insurance preference. Use up to four times daily as directed. (FOR ICD-9 250.00, 250.01). For QAC - HS accuchecks. May switch to any brand. 10/29/16   Thurnell Lose, MD  Blood Glucose Monitoring Suppl (ACCU-CHEK GUIDE) w/Device KIT Check blood sugar 3 times a day Patient taking differently: 1 each by Other route See admin instructions. Check blood sugar 3 times a day 06/23/20   [name], MD  carvedilol (COREG) 25 MG tablet Take 1 tablet (25 mg total) by mouth 2 (two) times daily. 02/25/20 02/24/21  Clegg, Amy D, NP  digoxin (LANOXIN) 0.125 MG tablet Take 1 tablet (0.125 mg total) by mouth daily. 11/09/19   Bensimhon, Shaune Pascal, MD  Dulaglutide (TRULICITY) 1.5 FY/9.2KM SOPN Inject 1.5 mg into the skin once a week. 10/17/20   [name], MD  ENTRESTO 97-103 MG TAKE 1 TABLET BY MOUTH 2 (TWO) TIMES DAILY. 05/30/20   Bensimhon, Shaune Pascal, MD  fluconazole (DIFLUCAN) 100 MG tablet Take 1.5 tablets (150 mg total) by mouth daily. 10/16/20   [name], MD  fluticasone [place]) 50 MCG/ACT nasal spray Apply 2 sprays each nostril daily for 1-2 weeks Patient taking differently: Place 2 sprays into both nostrils See admin instructions. Apply 2 sprays each nostril daily for 1-2 weeks (for mold allergies) 09/05/20   Kennith Gain, MD  gabapentin (NEURONTIN) 300 MG capsule Take 1 capsule (300 mg total) by mouth 3 (three) times daily. Patient taking differently: Take 300 mg by mouth as needed (neuropathy.). 08/13/20 08/13/21  [name], MD  glucose blood (ACCU-CHEK GUIDE) test strip Check blood sugar 3 times per day Patient taking differently: 1 each by Other route See admin instructions. Check blood sugar 3 times per day  06/23/20   [name], MD  guaiFENesin-codeine 100-10 MG/5ML syrup Take 5 mLs by mouth at bedtime. This will make you sleepy, do not take and drive Patient not taking: Reported on 10/06/2020 08/21/20   Riesa Pope, MD  levocetirizine (XYZAL) 5 MG tablet Take 1 tablet (5 mg total) by mouth every evening. 09/05/20   Kennith Gain, MD  metolazone (ZAROXOLYN) 2.5 MG tablet TAKE 1 TAB BY MOUTH DAILY AS NEEDED. AS DIRECTED BY HF CLINIC FOR WEIGHT GAIN OF 3 LBS OVERNIGHT OR 5 LBS WITHIN ONE WEEK. 10/14/20   Bensimhon, Shaune Pascal, MD  nystatin powder Apply 1 application topically 3 (three) times daily. 10/16/20   [name], MD  Olopatadine HCl 0.2 % SOLN Apply 1 drop to eye daily as needed. Patient not taking: Reported on 10/06/2020 09/05/20   Kennith Gain, MD  ondansetron (ZOFRAN ODT) 4 MG disintegrating tablet Take 1 tablet (4 mg total) by mouth every 8 (eight) hours as needed for nausea or vomiting. Patient not taking: Reported on 10/06/2020 09/11/20   Shary Key, DO  ondansetron (ZOFRAN) 4 MG tablet Take 1 tablet (4 mg total) by mouth daily as needed for nausea or vomiting. 10/16/20 10/16/21  [name], MD  potassium chloride SA (KLOR-CON M20) 20 MEQ tablet TAKE 2 TABLETS BY MOUTH AS NEEDED. WHEN YOU TAKE METOLAZONE 10/16/20   [name], MD  spironolactone (ALDACTONE) 25 MG tablet TAKE 1 TABLET BY MOUTH EVERY DAY Patient taking differently: Take 25 mg by mouth daily. 09/08/20   Lyda Jester M, PA-C  torsemide (DEMADEX) 20 MG tablet Take 3 tablets (60 mg total) by mouth daily. 05/01/20   Consuelo Pandy, PA-C    Allergies    Hydrocodone  Review of Systems   Review of Systems  Constitutional:  Positive for appetite change and diaphoresis. Negative for fever.  Respiratory:  Positive for shortness of breath. Negative for cough.   Cardiovascular:  Positive for leg swelling. Negative for chest pain.  Gastrointestinal:  Positive for abdominal pain,  constipation and nausea. Negative for vomiting.  Genitourinary:  Negative for dysuria and hematuria.  Musculoskeletal:  Negative for back pain.  Neurological:  Negative for syncope and light-headedness.   Physical Exam Updated Vital Signs BP (!) 162/137 (BP Location: Right Wrist)   Pulse (!) 120   Temp 97.7 F (36.5 C) (Oral)   Resp 16   SpO2 100%   Physical Exam Vitals and nursing note reviewed. Exam conducted with a chaperone present.  Constitutional:      Appearance: Normal appearance. He is obese.  HENT:     Head: Normocephalic and atraumatic.  Eyes:     General: No scleral icterus.       Right eye: No discharge.        Left eye: No discharge.     Extraocular Movements: Extraocular movements intact.     Pupils: Pupils are equal, round, and reactive to light.  Cardiovascular:     Rate and Rhythm: Regular rhythm. Tachycardia present.     Pulses: Normal pulses.     Heart sounds: Normal heart sounds. No murmur heard.   No friction rub. No gallop.  Pulmonary:     Effort: Tachypnea present.     Comments: Lung sounds are decreased secondary to body habitus.  Patient is mildly tachypneic, not in any respiratory distress. Abdominal:     General: Abdomen is flat. Bowel sounds are normal. There is no distension.     Palpations: Abdomen is soft.     Tenderness: There is abdominal tenderness in the left lower quadrant. There is no guarding or rebound.  Skin:    General: Skin is warm and dry.     Coloration: Skin is not jaundiced.  Neurological:     Mental Status: He is alert. Mental status is at baseline.     Coordination: Coordination normal.    ED Results / Procedures / Treatments   Labs (all labs ordered are listed, but only abnormal results are displayed) Labs Reviewed  BASIC METABOLIC PANEL - Abnormal; Notable for the following components:      Result Value   Sodium 125 (*)    Chloride 90 (*)    CO2 21 (*)    Glucose, Bld 251 (*)    Creatinine, Ser 1.63 (*)     GFR, Estimated 56 (*)  All other components within normal limits  CBC WITH DIFFERENTIAL/PLATELET  URINALYSIS, ROUTINE W REFLEX MICROSCOPIC    EKG None  Radiology CT ABDOMEN PELVIS W CONTRAST  Result Date: 10/28/2020 CLINICAL DATA:  Left lower quadrant abdominal pain, groin pain, nausea for 3 days EXAM: CT ABDOMEN AND PELVIS WITH CONTRAST TECHNIQUE: Multidetector CT imaging of the abdomen and pelvis was performed using the standard protocol following bolus administration of intravenous contrast. CONTRAST:  174m OMNIPAQUE IOHEXOL 350 MG/ML SOLN COMPARISON:  None. FINDINGS: Lower chest: There is mosaic attenuation in the lung bases. There is no focal consolidation or pleural effusion. The heart is enlarged. There is reflux of contrast into the IVC and hepatic veins. Hepatobiliary: The liver is diffusely hypoattenuating likely reflecting fatty infiltration. Layering hyperdense material in the gallbladder likely reflects sludge. There is mild pericholecystic stranding without appreciable wall thickening. There is no biliary ductal dilatation. Pancreas: The pancreas is atrophic. There are no focal lesions or contour abnormalities. There is no main pancreatic ductal dilatation or peripancreatic inflammatory change. Spleen: Unremarkable. Adrenals/Urinary Tract: The adrenals are unremarkable. The kidneys are symmetrically hypoenhancing, suspected due to decreased cardiac output. There are no focal lesions or stones. There is no hydronephrosis or hydroureter. The bladder is decompressed but grossly unremarkable. Stomach/Bowel: The stomach is unremarkable. There is no evidence of bowel obstruction. There is no abnormal bowel wall thickening or inflammatory change. The appendix is normal. There is a mild stool burden in the right colon extending to proximal transverse colon. Vascular/Lymphatic: The abdominal aorta is nonaneurysmal. The venous structures are not evaluated due to contrast bolus timing. There is no  abdominal or pelvic lymphadenopathy. Reproductive: The prostate and seminal vesicles are unremarkable. Other: There is small to moderate volume ascites. There is no organized drainable fluid collection in the abdomen or pelvis. There is no free intraperitoneal air. Musculoskeletal: There is intervertebral disc space narrowing with vacuum disc phenomenon at L4-L5 and L5-S1. There is no acute osseous abnormality or aggressive osseous lesion. IMPRESSION: 1. Small to moderate volume ascites. No organized or drainable fluid collection in the abdomen or pelvis. 2. Layering sludge in the gallbladder with mild pericholecystic fat stranding but no appreciable wall thickening. Correlate with history and exam, and consider right upper quadrant ultrasound or HIDA scan if there is clinical concern for acute cholecystitis. 3. Hepatic steatosis. 4. Cardiomegaly with evidence of right heart dysfunction as above. 5. Mosaic attenuation in the lung bases suggests air trapping/small airway disease. Electronically Signed   By: PValetta MoleM.D.   On: 10/28/2020 08:57    Procedures Procedures   Medications Ordered in ED Medications  ondansetron (ZOFRAN-ODT) disintegrating tablet 4 mg (4 mg Oral Given 10/28/20 0655)  acetaminophen (TYLENOL) tablet 650 mg (650 mg Oral Given 10/28/20 0705)  iohexol (OMNIPAQUE) 350 MG/ML injection 100 mL (100 mLs Intravenous Contrast Given 10/28/20 0830)    ED Course  I have reviewed the triage vital signs and the nursing notes.  Pertinent labs & imaging results that were available during my care of the patient were reviewed by me and considered in my medical decision making (see chart for details).  Clinical Course as of 10/28/20 1231  Tue Oct 28, 2020  1141 Dr. RJeanell Sparrowand I discussed the patient's abnormalities and work-up thus far with the patient and his spouse.  We discussed that his blood sugar is high, sodium is low and that he is retaining fluids but likely is in a mild DKA.  We  discussed that starting treatment is  the usual protocol, but the patient and his spouse are adamant that they do not want any additional labs or work-up until his primary care doctor and/or the heart failure team are consulted.  They state that they are aware of his lab abnormalities already and came to the ED for abdominal pain work-up, not to hear about abnormalities that are currently already being addressed with their outpatient care plan. [HS]    Clinical Course User Index [HS] Sherrill Raring, PA-C   MDM Rules/Calculators/A&P                           Patient with presentation of DKA versus CHF exacerbation.  Spoke with his internal medicine physician Dr. Gilford Rile as well as with his cardiologist Dr. Britta Mccreedy.  Both were able to see the patient at bedside, patient is now agreeable to having work-up done and being admitted to the hospital for additional work-up and management.  He has been tachycardic and tachypneic here in the ED setting, he is also hyponatremic with fluid overload.  Spoke with admitting physician who is happy to admit the patient.  Cardiology will consult.  Discussed HPI, physical exam and plan of care for this patient with attending Dr. Jeanell Sparrow. The attending physician evaluated this patient as part of a shared visit and agrees with plan of care.   Final Clinical Impression(s) / ED Diagnoses Final diagnoses:  None    Rx / DC Orders ED Discharge Orders     None        Sherrill Raring, [place] 10/28/20 1232    Pattricia Boss, MD 10/28/20 1348

## 2020-10-28 NOTE — ED Notes (Signed)
PICC line team at bedside

## 2020-10-28 NOTE — ED Notes (Signed)
Expressed to pt the need of a urine sample. Pt given urine sample cup.  

## 2020-10-28 NOTE — ED Notes (Signed)
Pt is agreeable to some tests now that the admitting physician has spoken with him.

## 2020-10-29 DIAGNOSIS — E1169 Type 2 diabetes mellitus with other specified complication: Secondary | ICD-10-CM | POA: Diagnosis not present

## 2020-10-29 DIAGNOSIS — I10 Essential (primary) hypertension: Secondary | ICD-10-CM | POA: Diagnosis not present

## 2020-10-29 DIAGNOSIS — Z7189 Other specified counseling: Secondary | ICD-10-CM

## 2020-10-29 DIAGNOSIS — B379 Candidiasis, unspecified: Secondary | ICD-10-CM | POA: Diagnosis not present

## 2020-10-29 DIAGNOSIS — N170 Acute kidney failure with tubular necrosis: Secondary | ICD-10-CM | POA: Diagnosis not present

## 2020-10-29 DIAGNOSIS — I509 Heart failure, unspecified: Secondary | ICD-10-CM

## 2020-10-29 DIAGNOSIS — I5084 End stage heart failure: Secondary | ICD-10-CM | POA: Diagnosis not present

## 2020-10-29 DIAGNOSIS — Z66 Do not resuscitate: Secondary | ICD-10-CM | POA: Diagnosis not present

## 2020-10-29 DIAGNOSIS — I5082 Biventricular heart failure: Secondary | ICD-10-CM | POA: Diagnosis not present

## 2020-10-29 DIAGNOSIS — E871 Hypo-osmolality and hyponatremia: Secondary | ICD-10-CM | POA: Diagnosis not present

## 2020-10-29 DIAGNOSIS — Z6841 Body Mass Index (BMI) 40.0 and over, adult: Secondary | ICD-10-CM | POA: Diagnosis not present

## 2020-10-29 DIAGNOSIS — I5023 Acute on chronic systolic (congestive) heart failure: Secondary | ICD-10-CM

## 2020-10-29 DIAGNOSIS — I130 Hypertensive heart and chronic kidney disease with heart failure and stage 1 through stage 4 chronic kidney disease, or unspecified chronic kidney disease: Secondary | ICD-10-CM | POA: Diagnosis not present

## 2020-10-29 DIAGNOSIS — Z20822 Contact with and (suspected) exposure to covid-19: Secondary | ICD-10-CM | POA: Diagnosis not present

## 2020-10-29 DIAGNOSIS — E669 Obesity, unspecified: Secondary | ICD-10-CM

## 2020-10-29 DIAGNOSIS — E872 Acidosis: Secondary | ICD-10-CM | POA: Diagnosis not present

## 2020-10-29 DIAGNOSIS — I472 Ventricular tachycardia: Secondary | ICD-10-CM | POA: Diagnosis not present

## 2020-10-29 LAB — BASIC METABOLIC PANEL
Anion gap: 16 — ABNORMAL HIGH (ref 5–15)
BUN: 28 mg/dL — ABNORMAL HIGH (ref 6–20)
CO2: 23 mmol/L (ref 22–32)
Calcium: 8.9 mg/dL (ref 8.9–10.3)
Chloride: 87 mmol/L — ABNORMAL LOW (ref 98–111)
Creatinine, Ser: 1.76 mg/dL — ABNORMAL HIGH (ref 0.61–1.24)
GFR, Estimated: 51 mL/min — ABNORMAL LOW (ref >60)
Glucose, Bld: 250 mg/dL — ABNORMAL HIGH (ref 70–99)
Potassium: 4 mmol/L (ref 3.5–5.1)
Sodium: 126 mmol/L — ABNORMAL LOW (ref 135–145)

## 2020-10-29 LAB — COMPREHENSIVE METABOLIC PANEL
ALT: 26 U/L (ref 0–44)
AST: 30 U/L (ref 15–41)
Albumin: 2.8 g/dL — ABNORMAL LOW (ref 3.5–5.0)
Alkaline Phosphatase: 91 U/L (ref 38–126)
Anion gap: 11 (ref 5–15)
BUN: 28 mg/dL — ABNORMAL HIGH (ref 6–20)
CO2: 26 mmol/L (ref 22–32)
Calcium: 8.7 mg/dL — ABNORMAL LOW (ref 8.9–10.3)
Chloride: 90 mmol/L — ABNORMAL LOW (ref 98–111)
Creatinine, Ser: 1.79 mg/dL — ABNORMAL HIGH (ref 0.61–1.24)
GFR, Estimated: 50 mL/min — ABNORMAL LOW (ref >60)
Glucose, Bld: 221 mg/dL — ABNORMAL HIGH (ref 70–99)
Potassium: 4.1 mmol/L (ref 3.5–5.1)
Sodium: 127 mmol/L — ABNORMAL LOW (ref 135–145)
Total Bilirubin: 1.8 mg/dL — ABNORMAL HIGH (ref 0.3–1.2)
Total Protein: 6.5 g/dL (ref 6.5–8.1)

## 2020-10-29 LAB — COOXEMETRY PANEL
Carboxyhemoglobin: 1.2 % (ref 0.5–1.5)
Methemoglobin: 0.5 % (ref 0.0–1.5)
O2 Saturation: 54.9 %
Total hemoglobin: 15.1 g/dL (ref 12.0–16.0)

## 2020-10-29 LAB — CBC
HCT: 44.6 % (ref 39.0–52.0)
Hemoglobin: 15 g/dL (ref 13.0–17.0)
MCH: 30.7 pg (ref 26.0–34.0)
MCHC: 33.6 g/dL (ref 30.0–36.0)
MCV: 91.2 fL (ref 80.0–100.0)
Platelets: 218 10*3/uL (ref 150–400)
RBC: 4.89 MIL/uL (ref 4.22–5.81)
RDW: 14.6 % (ref 11.5–15.5)
WBC: 7.2 10*3/uL (ref 4.0–10.5)
nRBC: 0 % (ref 0.0–0.2)

## 2020-10-29 LAB — GLUCOSE, CAPILLARY
Glucose-Capillary: 191 mg/dL — ABNORMAL HIGH (ref 70–99)
Glucose-Capillary: 219 mg/dL — ABNORMAL HIGH (ref 70–99)
Glucose-Capillary: 221 mg/dL — ABNORMAL HIGH (ref 70–99)
Glucose-Capillary: 236 mg/dL — ABNORMAL HIGH (ref 70–99)
Glucose-Capillary: 236 mg/dL — ABNORMAL HIGH (ref 70–99)
Glucose-Capillary: 261 mg/dL — ABNORMAL HIGH (ref 70–99)

## 2020-10-29 LAB — BETA-HYDROXYBUTYRIC ACID
Beta-Hydroxybutyric Acid: 0.2 mmol/L (ref 0.05–0.27)
Beta-Hydroxybutyric Acid: 0.17 mmol/L (ref 0.05–0.27)

## 2020-10-29 LAB — MAGNESIUM
Magnesium: 2 mg/dL (ref 1.7–2.4)
Magnesium: 1.9 mg/dL (ref 1.7–2.4)

## 2020-10-29 LAB — HIV ANTIBODY (ROUTINE TESTING W REFLEX): HIV Screen 4th Generation wRfx: NONREACTIVE

## 2020-10-29 LAB — HEPARIN LEVEL (UNFRACTIONATED): Heparin Unfractionated: <0.1 IU/mL — ABNORMAL LOW (ref 0.30–0.70)

## 2020-10-29 LAB — PHOSPHORUS: Phosphorus: 5.1 mg/dL — ABNORMAL HIGH (ref 2.5–4.6)

## 2020-10-29 MED ORDER — HEPARIN BOLUS VIA INFUSION
4000.0000 [IU] | Freq: Once | INTRAVENOUS | Status: AC
  Administered 2020-10-29: 4000 [IU] via INTRAVENOUS
  Filled 2020-10-29: qty 4000

## 2020-10-29 MED ORDER — POTASSIUM CHLORIDE CRYS ER 20 MEQ PO TBCR
40.0000 meq | EXTENDED_RELEASE_TABLET | Freq: Every day | ORAL | Status: DC
  Administered 2020-10-29 – 2020-10-30 (×2): 40 meq via ORAL
  Filled 2020-10-29 (×2): qty 2

## 2020-10-29 MED ORDER — INSULIN ASPART 100 UNIT/ML IJ SOLN
0.0000 [IU] | Freq: Three times a day (TID) | INTRAMUSCULAR | Status: DC
  Administered 2020-10-29 – 2020-10-30 (×3): 5 [IU] via SUBCUTANEOUS
  Administered 2020-10-30: 8 [IU] via SUBCUTANEOUS
  Administered 2020-10-30: 5 [IU] via SUBCUTANEOUS
  Administered 2020-10-31 (×2): 8 [IU] via SUBCUTANEOUS
  Administered 2020-10-31 – 2020-11-01 (×2): 5 [IU] via SUBCUTANEOUS
  Administered 2020-11-01: 8 [IU] via SUBCUTANEOUS
  Administered 2020-11-01 – 2020-11-03 (×6): 5 [IU] via SUBCUTANEOUS
  Administered 2020-11-03: 3 [IU] via SUBCUTANEOUS
  Administered 2020-11-04: 5 [IU] via SUBCUTANEOUS
  Administered 2020-11-04: 8 [IU] via SUBCUTANEOUS
  Administered 2020-11-04: 5 [IU] via SUBCUTANEOUS
  Administered 2020-11-05: 3 [IU] via SUBCUTANEOUS
  Administered 2020-11-05 – 2020-11-06 (×3): 5 [IU] via SUBCUTANEOUS
  Administered 2020-11-06: 3 [IU] via SUBCUTANEOUS
  Administered 2020-11-06: 5 [IU] via SUBCUTANEOUS
  Administered 2020-11-07: 3 [IU] via SUBCUTANEOUS
  Administered 2020-11-07: 5 [IU] via SUBCUTANEOUS
  Administered 2020-11-07: 2 [IU] via SUBCUTANEOUS
  Administered 2020-11-08 (×3): 5 [IU] via SUBCUTANEOUS
  Administered 2020-11-09 (×2): 3 [IU] via SUBCUTANEOUS
  Administered 2020-11-10 (×2): 5 [IU] via SUBCUTANEOUS
  Administered 2020-11-10 – 2020-11-12 (×4): 2 [IU] via SUBCUTANEOUS

## 2020-10-29 MED ORDER — AMIODARONE LOAD VIA INFUSION
150.0000 mg | Freq: Once | INTRAVENOUS | Status: AC
  Administered 2020-10-29: 150 mg via INTRAVENOUS
  Filled 2020-10-29: qty 83.34

## 2020-10-29 MED ORDER — ALUM & MAG HYDROXIDE-SIMETH 200-200-20 MG/5ML PO SUSP
30.0000 mL | ORAL | Status: DC | PRN
  Administered 2020-10-29: 30 mL via ORAL
  Filled 2020-10-29: qty 30

## 2020-10-29 MED ORDER — MELATONIN 3 MG PO TABS
3.0000 mg | ORAL_TABLET | Freq: Every day | ORAL | Status: DC
  Administered 2020-10-29 – 2020-11-11 (×14): 3 mg via ORAL
  Filled 2020-10-29 (×14): qty 1

## 2020-10-29 MED ORDER — AMIODARONE HCL IN DEXTROSE 360-4.14 MG/200ML-% IV SOLN
60.0000 mg/h | INTRAVENOUS | Status: DC
  Administered 2020-10-29: 60 mg/h via INTRAVENOUS
  Filled 2020-10-29: qty 200

## 2020-10-29 MED ORDER — POTASSIUM CHLORIDE CRYS ER 20 MEQ PO TBCR
40.0000 meq | EXTENDED_RELEASE_TABLET | Freq: Once | ORAL | Status: AC
  Administered 2020-10-29: 40 meq via ORAL
  Filled 2020-10-29: qty 2

## 2020-10-29 MED ORDER — MAGNESIUM SULFATE 2 GM/50ML IV SOLN
2.0000 g | Freq: Once | INTRAVENOUS | Status: AC
  Administered 2020-10-29: 2 g via INTRAVENOUS
  Filled 2020-10-29: qty 50

## 2020-10-29 MED ORDER — INSULIN GLARGINE-YFGN 100 UNIT/ML ~~LOC~~ SOLN
20.0000 [IU] | Freq: Every day | SUBCUTANEOUS | Status: DC
  Administered 2020-10-30: 20 [IU] via SUBCUTANEOUS
  Filled 2020-10-29 (×2): qty 0.2

## 2020-10-29 MED ORDER — INSULIN GLARGINE-YFGN 100 UNIT/ML ~~LOC~~ SOLN
10.0000 [IU] | SUBCUTANEOUS | Status: AC
  Administered 2020-10-29: 10 [IU] via SUBCUTANEOUS
  Filled 2020-10-29: qty 0.1

## 2020-10-29 MED ORDER — HEPARIN (PORCINE) 25000 UT/250ML-% IV SOLN
2250.0000 [IU]/h | INTRAVENOUS | Status: DC
  Administered 2020-10-29: 1700 [IU]/h via INTRAVENOUS
  Administered 2020-10-30 – 2020-10-31 (×4): 2250 [IU]/h via INTRAVENOUS
  Filled 2020-10-29 (×6): qty 250

## 2020-10-29 MED ORDER — AMIODARONE HCL IN DEXTROSE 360-4.14 MG/200ML-% IV SOLN
30.0000 mg/h | INTRAVENOUS | Status: DC
  Administered 2020-10-29 – 2020-11-10 (×24): 30 mg/h via INTRAVENOUS
  Filled 2020-10-29 (×27): qty 200

## 2020-10-29 MED ORDER — METOLAZONE 2.5 MG PO TABS
2.5000 mg | ORAL_TABLET | Freq: Once | ORAL | Status: AC
  Administered 2020-10-29: 2.5 mg via ORAL
  Filled 2020-10-29: qty 1

## 2020-10-29 NOTE — Progress Notes (Signed)
This nurse sent co-ox to lab and called lab to make aware of sample sent. Cvp line intact.

## 2020-10-29 NOTE — Plan of Care (Signed)
  Problem: Education: Goal: Knowledge of General Education information will improve Description: Including pain rating scale, medication(s)/side effects and non-pharmacologic comfort measures Outcome: Progressing   Problem: Safety: Goal: Ability to remain free from injury will improve Outcome: Progressing   

## 2020-10-29 NOTE — Progress Notes (Addendum)
HD#1 SUBJECTIVE:  Patient Summary: Jonathan [name] is a 35 y.o. with a pertinent PMH of HFrEF, uncontrolled insulin-naive type 2 diabetes, hypertension, OSA, who presented with shortness of breath and abdominal pain and admitted for heart failure exacerbation complicated by uncontrolled type 2 diabetes mellitus.   Overnight Events: Patient initially wanted to leave AMA and refused PICC line placement, however agreed to stay and have PICC line placed.  Interim History: Patient was evaluated at bedside.  He reports resolution of abdominal pain.  No complaints were noted.  His wife did call while our team was in the room and both the patient and his wife were informed of this of the severity of his current presentation.  After ending that phone call, the patient stated that he did not feel he was in shock and that he knew that he was really sick.  He said that his view on it is that either he is going to live or he is going to die.  He was agreeable to having our palliative care team come and speak with him.  OBJECTIVE:  Vital Signs: Vitals:   10/29/20 0405 10/29/20 0406 10/29/20 0541 10/29/20 0806  BP: (!) 155/125 (!) 158/118    Pulse: 84  (!) 122 (!) 150  Resp: (!) 25 (!) 22 19   Temp: (!) 97.4 F (36.3 C)   97.9 F (36.6 C)  TempSrc: Oral   Oral  SpO2:   (!) 77%   Weight: (!) 186.6 kg     Height:       Supplemental O2: Room Air SpO2: (!) 77 %  Filed Weights   10/28/20 1935 10/29/20 0405  Weight: (!) 186 kg (!) 186.6 kg     Intake/Output Summary (Last 24 hours) at 10/29/2020 1531 Last data filed at 10/29/2020 1256 Gross per 24 hour  Intake 793.38 ml  Output 1725 ml  Net -931.62 ml   Net IO Since Admission: -931.62 mL [10/29/20 1531]  Physical Exam: Constitutional: Morbidly obese gentleman laying in bed in no acute distress. HENT: Eyes: Neck: Unable to evaluate JVP due to body habitus.  No carotid bruit auscultated. Cardio: Tachycardia with regular rhythm.  No murmurs  noted. Pulm: Normal effort of breathing.  Clear to auscultation bilaterally. Chest: Abdomen: Abdomen is soft, nontender. GU: MSK: Lower extremity edema 3+ present to just distal to hips.  Distal pulses difficult to evaluate due to edema.  Bilateral radial pulses normal. Skin: Skin is cool to the touch. Neuro: Alert and oriented x3.  No focal deficit noted. Psych: Cooperative with exam.  He denies feeling like he is in shock however it is my opinion that there is some level of shock regarding the severity of how sick he is and potential prognosis.  At times he did seem tearful.    Patient Lines/Drains/Airways Status     Active Line/Drains/Airways     Name Placement date Placement time Site Days   Peripheral IV 10/28/20 20 G Right Antecubital 10/28/20  0746  Antecubital  1   PICC Double Lumen 10/28/20 PICC Right Cephalic 39 cm 0 cm 10/28/20  2778  -- 1             ASSESSMENT/PLAN:  Assessment: Active Problems:   Diabetes mellitus type 2 in obese (HCC)   Essential hypertension   OSA (obstructive sleep apnea)   Acute exacerbation of CHF (congestive heart failure) (HCC)   Plan: #Acute on chronic biventricular, low output heart failure Patient is admitted with significantly uncontrolled HFrEF,  likely secondary to dietary and medication nonadherence.  On further questioning the patient does say that he has had a heart cath performed before, in [place], and that he was not told of any abnormalities at that time.  He does not know what caused his heart failure.  Per chart review he has been noncompliant with follow-up at outpatient heart failure clinic.  Due to this he was discharged from the clinic.  He has an AKI indicating poor organ perfusion and his extremities are cool, concerning for distal hypoperfusion.  PICC line was placed in the emergency room yesterday. -Advanced heart failure team consulted, appreciate their recommendations  -Patient given Lasix bolus with increase of  infusion to 20 mg/h  -Continue 13.95 mL/h milrinone  -Continue digoxin 0.125 mg daily.  Check digoxin level.  -Received amiodarone bolus 150 mg.  Currently on infusion at 60 mg/h until 1645 today at which point that we will switch to 30 mg/h.  -Do not give beta-blockers due to low output concerns. -IV heparin bolus of 4000 units, then start gtt. at 1700 units/h.  Check Keppra levels 6 hours after infusion starts.  Monitor daily heparin level and CBC. -Co-OX revealed total hemoglobin of 15.1, O2 saturation 54.9%, carboxyhemoglobin of 1.2%, methemoglobin 0.5%, and these are all within normal limits.  -CVP is 23  - K >4, Mg > 2 goal - Trend BMP - CVP monitoring -Palliative care consulted  #Uncontrolled type 2 diabetes mellitus Most recent HbA1c was greater than 14% and he is on Trulicity 1.5 mg.  His blood sugars have been consistently elevated since admission.  There have been no interim signs of DKA to this point.  Beta hydroxybutyrate normal at 0.17. -CBG monitoring -SSI with meals  -He will need to have insulin for home on discharge -Increase Lantus to 20U  #AKI BUN/creatinine of 28/1.79.  It seems that his baseline creatinine is 0.93.  He does report decreased urine output over the last week. -Trend BMP  -Avoid nephrotoxic agents -Strict I's and O's -IV Lasix and milrinone  #HTN: #HLD: Patient has a history of hypertension on carvedilol 25 mg twice daily Entresto 97-103 mg twice daily and spironolactone 25 mg daily.  -Currently holding these medications   #OSA Patient has a history of OSA with a significantly elevated AHI at 65.   - CPAP  #Hypervolemic hyponatremia Morning labs revealed hyponatremia at 127 which corrected to 129 considering his persistent hyperglycemia.  This is likely secondary to volume overload from acute on chronic heart failure exacerbation.  We will continue to diurese. -Trend BMP  Best Practice: Diet: Cardiac diet IVF: Fluids: None VTE:  Heparin Code: Full AB: None DISPO: Continue to monitor inpatient.  Signature: Champ Mungo, D.O.  Internal Medicine Resident, PGY-1 [facility]  Pager: [phone] 3:31 PM, 10/29/2020   Please contact the on call pager after 5 pm and on weekends at [phone].

## 2020-10-29 NOTE — Progress Notes (Signed)
PT Cancellation Note  Patient Details Name: Jonathan [name] MRN: [id number] DOB: 1985-09-28   Cancelled Treatment:    Reason Eval/Treat Not Completed: Medical issues which prohibited therapy. RN requesting hold on PT eval at this time as pt's HR is in the 150s at rest. Will plan to follow-up as time permits and as appropriate.   Raymond Gurney, PT, DPT Acute Rehabilitation Services  Pager: [phone] Office: [phone]    [name] 10/29/2020, 10:30 AM

## 2020-10-29 NOTE — Progress Notes (Signed)
Orthopedic Tech Progress Note Patient Details:  Jonathan [name] 06-22-1985 [id number]  Ortho Devices Type of Ortho Device: Radio broadcast assistant Ortho Device/Splint Location: BLE Ortho Device/Splint Interventions: Application, Adjustment, Ordered   Post Interventions Patient Tolerated: Well Instructions Provided: Care of device  Donald Pore 10/29/2020, 5:21 PM

## 2020-10-29 NOTE — Progress Notes (Signed)
ANTICOAGULATION CONSULT NOTE - Follow Up Consult  Pharmacy Consult for Heparin Indication: atrial fibrillation  Allergies  Allergen Reactions   Hydrocodone     Hives     Patient Measurements: Height: 6\' 2"  (188 cm) Weight: (!) 186.6 kg (411 lb 4.8 oz) IBW/kg (Calculated) : 82.2 Heparin Dosing Weight: 130 kg  Vital Signs: Temp: 97.9 F (36.6 C) (09/14 0806) Temp Source: Oral (09/14 0806) Pulse Rate: 150 (09/14 0806)  Labs: Recent Labs    10/28/20 0313 10/28/20 1330 10/29/20 0611 10/29/20 1804  HGB 15.9 18.7* 15.0  --   HCT 50.1 55.0* 44.6  --   PLT 261  --  218  --   HEPARINUNFRC  --   --   --  <0.10*  CREATININE 1.63*  --  1.79*  --     Estimated Creatinine Clearance: 101 mL/min (A) (by C-G formula based on SCr of 1.79 mg/dL (H)).   Assessment: Anticoag: enox40>>hep gtt for a fib, hgb 15, plts 218 - Hep level <0.1  Goal of Therapy:  Heparin level 0.3-0.7 units/ml Monitor platelets by anticoagulation protocol: Yes   Plan:  Rebolus heparin 4000 units Increase infusion to 22500 units/hr Recheck HL and CBC in AM.   Asaf Elmquist S. 10/31/20, PharmD, BCPS Clinical Staff Pharmacist [email or web address]  Merilynn Finland, Unity Luepke Stillinger 10/29/2020,7:53 PM

## 2020-10-29 NOTE — Progress Notes (Signed)
ANTICOAGULATION CONSULT NOTE - Initial Consult  Pharmacy Consult for IV heparin  Indication: atrial fibrillation  Allergies  Allergen Reactions   Hydrocodone     Hives     Patient Measurements: Height: 6\' 2"  (188 cm) Weight: (!) 186.6 kg (411 lb 4.8 oz) IBW/kg (Calculated) : 82.2 Heparin Dosing Weight: ~ 130 kg  Vital Signs: Temp: 97.9 F (36.6 C) (09/14 0806) Temp Source: Oral (09/14 0806) BP: 158/118 (09/14 0406) Pulse Rate: 150 (09/14 0806)  Labs: Recent Labs    10/28/20 0313 10/28/20 1330 10/29/20 0611  HGB 15.9 18.7* 15.0  HCT 50.1 55.0* 44.6  PLT 261  --  218  CREATININE 1.63*  --  1.79*    Estimated Creatinine Clearance: 101 mL/min (A) (by C-G formula based on SCr of 1.79 mg/dL (H)).   Medical History: Past Medical History:  Diagnosis Date   Acute pain of left foot 05/03/2017   Asthma    Asthma, chronic, mild persistent, uncomplicated 10/26/2016   Charcot ankle, left 05/31/2017   CHF (congestive heart failure) (HCC)    Chronic systolic (congestive) heart failure (HCC)    Diabetes mellitus type 2 in obese (HCC) 04/21/2014   Diabetic polyneuropathy associated with type 2 diabetes mellitus (HCC) 03/14/2017   Essential hypertension 10/26/2016   Hypertension    Obesity    Obesity, Class III, BMI 40-49.9 (morbid obesity) (HCC) 10/26/2016   OSA (obstructive sleep apnea) 12/02/2016   Type 2 diabetes mellitus with diabetic neuropathy (HCC)     Medications:  Infusions:   amiodarone     Followed by   amiodarone     furosemide (LASIX) 200 mg in dextrose 5% 100 mL (2mg /mL) infusion 20 mg/hr (10/29/20 1057)   heparin     milrinone 0.25 mcg/kg/min (10/29/20 0743)   sodium chloride      Assessment: 35 yo male with hx HFrEF, now found with AFL w/ RVR.  Pharmacy asked to anticoagulate with IV heparin.  No oral anticoagulation for now in case of need for mechanical support.  Goal of Therapy:  Heparin level 0.3-0.7 units/ml Monitor platelets by  anticoagulation protocol: Yes   Plan:  -Start IV heparin with bolus of 4000 units x 1, then start gtt at 1700 units/hr. -Check heparin level 6 hrs after gtt starts -Daily heparin level and CBC.  10/31/20, 31, [facility] Clinical Pharmacist  10/29/2020 11:40 AM   [facility] pharmacy phone numbers are listed on [email or web address]

## 2020-10-29 NOTE — Progress Notes (Addendum)
Advanced Heart Failure Rounding Note  PCP-Cardiologist: Armanda Magic, [name]  HF [name]: Dr. Gala Romney   Patient Profile   Jonathan [name] is a 35 year old male with history of HFrEF, NICM, HTN, asthma, OSA, morbid obesity, and uncontrolled DM. Hx of medical noncompliance. Admitted on 09/13 with acute on chronic systolic HF, severe hyperglycemia, AKI and abdominal pain.     Subjective:   Initially refused PICC line, eventually agreed and placed yesterday evening.  09/13: Empiric milrinone 0.25, lasix gtt at 10/hr  Co-ox pending, had to be redrawn.  Scr 1.63 > 1.79, baseline < 1. Na 125 > 127  -1L since admit. Weight up 1 lb.  Hypertensive  Tachycardia with rate around 150  Cardiac Studies   Echo 09/22: EF < 20%, RV fxn moderately reduced, trivial MR, mild TR Echo 10/21: EF 30-35% with moderate RV dysfunction in setting of recurrent RBBB with significant dyssynchrony.  ECHO 04/2019: EF 30-35%  ECHO 2019: EF 55%   Echo EF 15-20% in [place] in 2017. Cath around that time without any significant CAD per patient.  Objective:   Weight Range: (!) 186.6 kg Body mass index is 52.81 kg/m.   Vital Signs:   Temp:  [97.4 F (36.3 C)-98.8 F (37.1 C)] 97.9 F (36.6 C) (09/14 0806) Pulse Rate:  [48-150] 150 (09/14 0806) Resp:  [12-38] 19 (09/14 0541) BP: (59-162)/(21-137) 158/118 (09/14 0406) SpO2:  [77 %-100 %] 77 % (09/14 0541) Weight:  [186 kg-186.6 kg] 186.6 kg (09/14 0405) Last BM Date: 10/27/20  Weight change: Filed Weights   10/28/20 1935 10/29/20 0405  Weight: (!) 186 kg (!) 186.6 kg    Intake/Output:   Intake/Output Summary (Last 24 hours) at 10/29/2020 0910 Last data filed at 10/29/2020 0741 Gross per 24 hour  Intake 553.38 ml  Output 1725 ml  Net -1171.62 ml      Physical Exam   CVP 20 General:  Well appearing [name]. No resp difficulty HEENT: Normal Neck: Supple. JVP to jaw. Carotids 2+ bilat; no bruits. No lymphadenopathy or thyromegaly appreciated. Cor: PMI  nondisplaced. Regular rate & rhythm. No rubs, gallops or murmurs. Lungs: Diminished Abdomen: Obese. Tense. Extremities: No cyanosis, clubbing, rash, 3+ pitting edema to waist Neuro: Alert & orientedx3, cranial nerves grossly intact. moves all 4 extremities w/o difficulty. Affect pleasant   Telemetry   Tachycardia, rate 150s  EKG    No new  Labs    CBC Recent Labs    10/28/20 0313 10/28/20 1330 10/29/20 0611  WBC 8.2  --  7.2  NEUTROABS 4.3  --   --   HGB 15.9 18.7* 15.0  HCT 50.1 55.0* 44.6  MCV 95.8  --  91.2  PLT 261  --  218   Basic Metabolic Panel Recent Labs    77/82/42 0313 10/28/20 1330 10/28/20 2350 10/29/20 0611  NA 125* 127*  --  127*  K 4.7 5.0  --  4.1  CL 90*  --   --  90*  CO2 21*  --   --  26  GLUCOSE 251*  --   --  221*  BUN 19  --   --  28*  CREATININE 1.63*  --   --  1.79*  CALCIUM 8.9  --   --  8.7*  MG  --   --  2.0 1.9  PHOS  --   --  5.1*  --    Liver Function Tests Recent Labs    10/29/20 0611  AST  30  ALT 26  ALKPHOS 91  BILITOT 1.8*  PROT 6.5  ALBUMIN 2.8*   No results for input(s): LIPASE, AMYLASE in the last 72 hours. Cardiac Enzymes No results for input(s): CKTOTAL, CKMB, CKMBINDEX, TROPONINI in the last 72 hours.  BNP: BNP (last 3 results) Recent Labs    05/01/20 1020 09/11/20 0951 10/28/20 1315  BNP 862.1* 803.3* 1,547.7*    ProBNP (last 3 results) No results for input(s): PROBNP in the last 8760 hours.   D-Dimer No results for input(s): DDIMER in the last 72 hours. Hemoglobin A1C No results for input(s): HGBA1C in the last 72 hours. Fasting Lipid Panel No results for input(s): CHOL, HDL, LDLCALC, TRIG, CHOLHDL, LDLDIRECT in the last 72 hours. Thyroid Function Tests No results for input(s): TSH, T4TOTAL, T3FREE, THYROIDAB in the last 72 hours.  Invalid input(s): FREET3  Other results:   Imaging    DG Chest Portable 1 View  Result Date: 10/28/2020 CLINICAL DATA:  Shortness of breath,  tachycardia EXAM: PORTABLE CHEST 1 VIEW COMPARISON:  09/11/2020 FINDINGS: Cardiomegaly. Lingular scarring or atelectasis. Right lung clear. No effusions or acute bony abnormality. IMPRESSION: Cardiomegaly. Lingular scarring or atelectasis. No active disease. Electronically Signed   By: Charlett Nose M.D.   On: 10/28/2020 11:34   ECHOCARDIOGRAM COMPLETE  Result Date: 10/28/2020    ECHOCARDIOGRAM REPORT   Patient Name:   Jonathan [name] Date of Exam: 10/28/2020 Medical Rec #:  [id number]     Height:       74.0 in Accession #:    [id number]    Weight:       395.1 lb Date of Birth:  1986-01-04      BSA:          2.903 m Patient Age:    35 years      BP:           162/137 mmHg Patient Gender: M             HR:           118 bpm. Exam Location:  Inpatient Procedure: 2D Echo, Cardiac Doppler and Color Doppler Indications:    CHF  History:        Patient has prior history of Echocardiogram examinations, most                 recent 12/03/2019. CHF; Risk Factors:Diabetes and Hypertension.                 Patient refused the use of Definity.  Sonographer:    Mikki Harbor Referring Phys: [phone] AMY D CLEGG IMPRESSIONS  1. GLobal hypokineiss with apical akinesis. Compared to the echo 11/2019, systolic function is worse. Left ventricular ejection fraction, by estimation, is <20%. The left ventricle has severely decreased function. The left ventricle demonstrates global hypokinesis. Left ventricular diastolic parameters are consistent with Grade II diastolic dysfunction (pseudonormalization).  2. Right ventricular systolic function is moderately reduced. The right ventricular size is normal.  3. Right atrial size was mildly dilated.  4. The mitral valve is normal in structure. Trivial mitral valve regurgitation. No evidence of mitral stenosis.  5. The aortic valve is normal in structure. There is mild calcification of the aortic valve. Aortic valve regurgitation is not visualized. No aortic stenosis is present. FINDINGS  Left  Ventricle: GLobal hypokineiss with apical akinesis. Compared to the echo 11/2019, systolic function is worse. Left ventricular ejection fraction, by estimation, is <20%. The left ventricle has severely decreased function. The  left ventricle demonstrates global hypokinesis. The left ventricular internal cavity size was normal in size. There is no left ventricular hypertrophy. Left ventricular diastolic parameters are consistent with Grade II diastolic dysfunction (pseudonormalization). Indeterminate filling pressures. Right Ventricle: The right ventricular size is normal. No increase in right ventricular wall thickness. Right ventricular systolic function is moderately reduced. Left Atrium: Left atrial size was normal in size. Right Atrium: Right atrial size was mildly dilated. Pericardium: There is no evidence of pericardial effusion. Mitral Valve: The mitral valve is normal in structure. Trivial mitral valve regurgitation. No evidence of mitral valve stenosis. MV peak gradient, 5.9 mmHg. The mean mitral valve gradient is 2.0 mmHg. Tricuspid Valve: The tricuspid valve is normal in structure. Tricuspid valve regurgitation is mild . No evidence of tricuspid stenosis. Aortic Valve: The aortic valve is normal in structure. There is mild calcification of the aortic valve. Aortic valve regurgitation is not visualized. No aortic stenosis is present. Aortic valve mean gradient measures 1.0 mmHg. Aortic valve peak gradient measures 1.8 mmHg. Aortic valve area, by VTI measures 2.18 cm. Pulmonic Valve: The pulmonic valve was normal in structure. Pulmonic valve regurgitation is trivial. No evidence of pulmonic stenosis. Aorta: The aortic root is normal in size and structure. IAS/Shunts: No atrial level shunt detected by color flow Doppler.  LEFT VENTRICLE PLAX 2D LVIDd:         5.50 cm      Diastology LVIDs:         5.00 cm      LV e' lateral:   7.20 cm/s LV PW:         1.30 cm      LV E/e' lateral: 11.7 LV IVS:        1.40 cm  LVOT diam:     2.00 cm LV SV:         23 LV SV Index:   8 LVOT Area:     3.14 cm  LV Volumes (MOD) LV vol d, MOD A2C: 168.0 ml LV vol d, MOD A4C: 171.0 ml LV vol s, MOD A2C: 137.0 ml LV vol s, MOD A4C: 123.0 ml LV SV MOD A2C:     31.0 ml LV SV MOD A4C:     171.0 ml LV SV MOD BP:      42.2 ml LEFT ATRIUM             Index       RIGHT ATRIUM           Index LA diam:        4.80 cm 1.65 cm/m  RA Area:     26.30 cm LA Vol (A2C):   78.8 ml 27.15 ml/m RA Volume:   84.70 ml  29.18 ml/m LA Vol (A4C):   70.8 ml 24.39 ml/m LA Biplane Vol: 74.6 ml 25.70 ml/m  AORTIC VALVE AV Area (Vmax):    2.08 cm AV Area (Vmean):   1.85 cm AV Area (VTI):     2.18 cm AV Vmax:           68.00 cm/s AV Vmean:          50.200 cm/s AV VTI:            0.104 m AV Peak Grad:      1.8 mmHg AV Mean Grad:      1.0 mmHg LVOT Vmax:         45.00 cm/s LVOT Vmean:        29.500 cm/s  LVOT VTI:          0.072 m LVOT/AV VTI ratio: 0.70  AORTA Ao Root diam: 3.10 cm MITRAL VALVE               TRICUSPID VALVE MV Area (PHT): 6.32 cm    TR Peak grad:   27.7 mmHg MV Area VTI:   1.46 cm    TR Vmax:        263.00 cm/s MV Peak grad:  5.9 mmHg MV Mean grad:  2.0 mmHg    SHUNTS MV Vmax:       1.21 m/s    Systemic VTI:  0.07 m MV Vmean:      57.4 cm/s   Systemic Diam: 2.00 cm MV Decel Time: 120 msec MV E velocity: 84.00 cm/s Jonathan [name] Electronically signed by Jonathan [name] Signature Date/Time: 10/28/2020/4:53:21 PM    Final    [place] EKG SITE RITE  Result Date: 10/28/2020 If Site Rite image not attached, placement could not be confirmed due to current cardiac rhythm.    Medications:     Scheduled Medications:  Chlorhexidine Gluconate Cloth  6 each Topical Daily   digoxin  0.125 mg Oral Daily   enoxaparin (LOVENOX) injection  40 mg Subcutaneous Q24H   insulin aspart  0-15 Units Subcutaneous TID WC   insulin glargine-yfgn  10 Units Subcutaneous Daily   loratadine  10 mg Oral Daily   sodium chloride flush  10-40 mL Intracatheter Q12H    sodium chloride flush  3 mL Intravenous Q12H    Infusions:  furosemide (LASIX) 200 mg in dextrose 5% 100 mL (2mg /mL) infusion 10 mg/hr (10/28/20 1748)   milrinone 0.25 mcg/kg/min (10/29/20 0743)   sodium chloride      PRN Medications: acetaminophen **OR** acetaminophen, sodium chloride flush  Assessment/Plan   A/C HFrEF, NICM  -Most recent ECHO back in 2021 EF 30-35 % with moderate RV dysfunction in the setting of recurrent RBBB with significant dyssynchrony. Echo this admit with EF < 20% and moderate RV dysfunction - Marked volume overload. Concern for low output. On 0.25 milrinone. Co-ox pending, had to be redrawn. - Given 80 mg IV lasix follwed by lasix drip at 10 mg per hour. Neg just 1L last 24 hrs. CVP 20. Increase lasix drip to 20/hr. May need metolazone or diamox if output not starting to pick up. - BMET daily. Monitor K. Strict Is/Os. - On dig 0.125 mg daily. Check dig level. - No bb with possible low output.  - Intolerant SGLT2i due to candidiasis - Consider adding back entresto and spiro this admit - Not a candidate for advanced therapies with Hgb A1C >14 and noncompliance. Milrinone only a short-term solution.   2. Uncontrolled DM -9/1 Hgb A1C > 14.  -Per primary team.    3. AKI -Baseline creatinine < 1.  - Creatinine 1.6> 1.79.   - As above concerned this is d/t low output heart failure.    4. Tachycardia - Sinus tach on admit. Rate 150 this am. Rhythm regular. Suspect atrial flutter. Wide QRS at baseline. - STAT ECG - Amio bolus + amio gtt - Heparin gtt   5. Abdominal Pain  -CT with sludge in gall bladder.  -? Low output heart failure    6. Hypervolemic Hyponatremia  - Na 125 > 127 - Restrict free water.  - Diurese   7. OSA Severe OSA AHI 65 - Verify he has Bipap    8. Morbid Obesity  9. H/O medical noncompliance -Reports taking HF medications prior to admit. Missed last few clinic f/u and discharged from HF paramedicine d/t noncompliance.   -Consider HF paramedicine at discharge if willing  Length of Stay: 1  FINCH, LINDSAY N, PA-C  10/29/2020, 9:10 AM  Advanced Heart Failure Team Pager [phone] (M-F; 7a - 5p)  Please contact [facility] for night-coverage after hours (5p -7a ) and weekends on [email or web address]   Patient seen and examined with the above-signed Advanced Practice Provider and/or Housestaff. I personally reviewed laboratory data, imaging studies and relevant notes. I independently examined the patient and formulated the important aspects of the plan. I have edited the note to reflect any of my changes or salient points. I have personally discussed the plan with the patient and/or family.  Milrinone starting empirically last night. Remains on IV lasix gtt at 10. Developed AFL with RVR this am. Only modest diuresis. CVP 20. No co-ox yet. Feels weak and tired.   General:  Obese male lying in bed. No distress HEENT: normal Neck: supple. nJVP to ear . Carotids 2+ bilat; no bruits. No lymphadenopathy or thryomegaly appreciated. Cor: PMI nondisplaced. Regular tachy No rubs, gallops or murmurs. Lungs: clear Abdomen: obese soft, nontender, nondistended. No hepatosplenomegaly. No bruits or masses. Good bowel sounds. Extremities: no cyanosis, clubbing, rash, 3+ woody edema. Cool  Neuro: alert & orientedx3, cranial nerves grossly intact. moves all 4 extremities w/o difficulty. Affect flat  He has low output HF with marked volume overload with severe biventricular dysfunction on echo. Now on milrinone. Course c/b development of AFL with RVR.   He is likely near end-stage.   Will start IV amio and heparin for AFL. Await co-ox. Continue milrinone. Increase lasix gtt to 20.   D/w Teaching Service team at bedside.   Arvilla Meres, [name]  10:58 AM

## 2020-10-30 DIAGNOSIS — I5022 Chronic systolic (congestive) heart failure: Secondary | ICD-10-CM | POA: Diagnosis not present

## 2020-10-30 DIAGNOSIS — Z7189 Other specified counseling: Secondary | ICD-10-CM | POA: Diagnosis not present

## 2020-10-30 DIAGNOSIS — R600 Localized edema: Secondary | ICD-10-CM | POA: Diagnosis not present

## 2020-10-30 DIAGNOSIS — E669 Obesity, unspecified: Secondary | ICD-10-CM | POA: Diagnosis not present

## 2020-10-30 DIAGNOSIS — G4733 Obstructive sleep apnea (adult) (pediatric): Secondary | ICD-10-CM

## 2020-10-30 DIAGNOSIS — E1169 Type 2 diabetes mellitus with other specified complication: Secondary | ICD-10-CM | POA: Diagnosis not present

## 2020-10-30 DIAGNOSIS — E11610 Type 2 diabetes mellitus with diabetic neuropathic arthropathy: Secondary | ICD-10-CM | POA: Diagnosis not present

## 2020-10-30 DIAGNOSIS — I10 Essential (primary) hypertension: Secondary | ICD-10-CM

## 2020-10-30 DIAGNOSIS — I509 Heart failure, unspecified: Secondary | ICD-10-CM | POA: Diagnosis not present

## 2020-10-30 DIAGNOSIS — I5023 Acute on chronic systolic (congestive) heart failure: Secondary | ICD-10-CM | POA: Diagnosis not present

## 2020-10-30 LAB — GLUCOSE, CAPILLARY
Glucose-Capillary: 235 mg/dL — ABNORMAL HIGH (ref 70–99)
Glucose-Capillary: 237 mg/dL — ABNORMAL HIGH (ref 70–99)
Glucose-Capillary: 237 mg/dL — ABNORMAL HIGH (ref 70–99)
Glucose-Capillary: 241 mg/dL — ABNORMAL HIGH (ref 70–99)
Glucose-Capillary: 261 mg/dL — ABNORMAL HIGH (ref 70–99)
Glucose-Capillary: 274 mg/dL — ABNORMAL HIGH (ref 70–99)

## 2020-10-30 LAB — CBC
HCT: 45.3 % (ref 39.0–52.0)
Hemoglobin: 15 g/dL (ref 13.0–17.0)
MCH: 30.2 pg (ref 26.0–34.0)
MCHC: 33.1 g/dL (ref 30.0–36.0)
MCV: 91.3 fL (ref 80.0–100.0)
Platelets: 237 10*3/uL (ref 150–400)
RBC: 4.96 MIL/uL (ref 4.22–5.81)
RDW: 14.2 % (ref 11.5–15.5)
WBC: 8.1 10*3/uL (ref 4.0–10.5)
nRBC: 0 % (ref 0.0–0.2)

## 2020-10-30 LAB — BASIC METABOLIC PANEL
Anion gap: 17 — ABNORMAL HIGH (ref 5–15)
BUN: 26 mg/dL — ABNORMAL HIGH (ref 6–20)
CO2: 25 mmol/L (ref 22–32)
Calcium: 8.4 mg/dL — ABNORMAL LOW (ref 8.9–10.3)
Chloride: 84 mmol/L — ABNORMAL LOW (ref 98–111)
Creatinine, Ser: 1.72 mg/dL — ABNORMAL HIGH (ref 0.61–1.24)
GFR, Estimated: 53 mL/min — ABNORMAL LOW (ref >60)
Glucose, Bld: 399 mg/dL — ABNORMAL HIGH (ref 70–99)
Potassium: 3.2 mmol/L — ABNORMAL LOW (ref 3.5–5.1)
Sodium: 126 mmol/L — ABNORMAL LOW (ref 135–145)

## 2020-10-30 LAB — COOXEMETRY PANEL
Carboxyhemoglobin: 1.4 % (ref 0.5–1.5)
Methemoglobin: 0.7 % (ref 0.0–1.5)
O2 Saturation: 55 %
Total hemoglobin: 15.4 g/dL (ref 12.0–16.0)

## 2020-10-30 LAB — MAGNESIUM: Magnesium: 1.8 mg/dL (ref 1.7–2.4)

## 2020-10-30 LAB — HEPARIN LEVEL (UNFRACTIONATED)
Heparin Unfractionated: 0.36 IU/mL (ref 0.30–0.70)
Heparin Unfractionated: 0.38 IU/mL (ref 0.30–0.70)

## 2020-10-30 LAB — DIGOXIN LEVEL: Digoxin Level: <0.2 ng/mL — ABNORMAL LOW (ref 0.8–2.0)

## 2020-10-30 MED ORDER — INSULIN ASPART 100 UNIT/ML IJ SOLN: 5.0000 [IU] | Freq: Three times a day (TID) | INTRAMUSCULAR | Status: DC

## 2020-10-30 MED ORDER — POTASSIUM CHLORIDE CRYS ER 20 MEQ PO TBCR
40.0000 meq | EXTENDED_RELEASE_TABLET | Freq: Once | ORAL | Status: AC
  Administered 2020-10-30: 40 meq via ORAL
  Filled 2020-10-30: qty 2

## 2020-10-30 MED ORDER — METOLAZONE 2.5 MG PO TABS
2.5000 mg | ORAL_TABLET | Freq: Once | ORAL | Status: AC
  Administered 2020-10-30: 2.5 mg via ORAL
  Filled 2020-10-30: qty 1

## 2020-10-30 MED ORDER — MAGNESIUM SULFATE 4 GM/100ML IV SOLN
4.0000 g | Freq: Once | INTRAVENOUS | Status: AC
  Administered 2020-10-30: 4 g via INTRAVENOUS
  Filled 2020-10-30: qty 100

## 2020-10-30 NOTE — Progress Notes (Signed)
Patient adamantly refused to be stuck for Heparin lab by lab. RN paged MD to notify. Heparin gtt has been infusing in the R PIV. RN drew labs from PICC lumen.

## 2020-10-30 NOTE — Progress Notes (Signed)
Orthopedic Tech Progress Note Patient Details:  Jonathan [name] 09-21-1985 [id number]  Ortho Devices Type of Ortho Device: Radio broadcast assistant Ortho Device/Splint Location: Bilateral Ortho Device/Splint Interventions: Application, Ordered   Post Interventions Patient Tolerated: Well Instructions Provided: Care of device  Ahmira Boisselle A Ladarius Seubert 10/30/2020, 4:28 PM

## 2020-10-30 NOTE — Progress Notes (Signed)
ANTICOAGULATION CONSULT NOTE - Follow Up Consult  Pharmacy Consult for heparin Indication: atrial fibrillation  Labs: Recent Labs    10/28/20 0313 10/28/20 1330 10/29/20 0611 10/29/20 1804 10/30/20 0500  HGB 15.9 18.7* 15.0  --  15.0  HCT 50.1 55.0* 44.6  --  45.3  PLT 261  --  218  --  237  HEPARINUNFRC  --   --   --  <0.10* 0.36  CREATININE 1.63*  --  1.79* 1.76*  --     Assessment/Plan:  35yo male therapeutic on heparin after rate change. Will continue infusion at current rate of 2250 units/hr and confirm stable with additional level.   Vernard Gambles, PharmD, BCPS  10/30/2020,5:40 AM

## 2020-10-30 NOTE — Progress Notes (Signed)
HD#2 SUBJECTIVE:  Patient Summary: Jonathan [name] is a 35 y.o. with a pertinent PMH of HFrEF, uncontrolled insulin-naive type 2 diabetes, hypertension, OSA, who presented with shortness of breath and abdominal pain and admitted for heart failure exacerbation complicated by uncontrolled type 2 diabetes mellitus.   Overnight Events: None  Interim History: Patient evaluated at bedside.  His wife is present for the exam.  He states that he does have some abdominal pain.  He is not reporting any chest pain, trouble breathing.  He reports frequent urination with Lasix and I counseled him on the importance of adequate urination as far as his heart failure is concerned.  We discussed that he is still very sick.  From a psychological standpoint his mood seems to be improved today.  OBJECTIVE:  Vital Signs: Vitals:   10/30/20 0552 10/30/20 0726 10/30/20 0929 10/30/20 1223  BP:  (!) 144/98  (!) 155/126  Pulse:  (!) 113 (!) 118 (!) 121  Resp:  20  (!) 25  Temp:  97.8 F (36.6 C)  98.5 F (36.9 C)  TempSrc:  Oral  Oral  SpO2:  92%  95%  Weight: (!) 185.2 kg     Height:       Supplemental O2: Room Air SpO2: 95 %  Filed Weights   10/28/20 1935 10/29/20 0405 10/30/20 0552  Weight: (!) 186 kg (!) 186.6 kg (!) 185.2 kg     Intake/Output Summary (Last 24 hours) at 10/30/2020 1506 Last data filed at 10/30/2020 0900 Gross per 24 hour  Intake 720 ml  Output 4100 ml  Net -3380 ml   Net IO Since Admission: -4,311.62 mL [10/30/20 1506]  Physical Exam: Constitutional: Morbidly obese gentleman sitting on side of bed.  No acute distress noted. Cardio: Tachycardia with regular rhythm.  No murmurs noted. Pulm: Normal effort of breathing.  Clear to auscultation bilaterally. Abdomen: Abdomen is soft, nontender.  There is 2+ pitting edema to the level of the umbilicus. MSK: Lower extremity edema 3+ present.  Distal pulses difficult to evaluate secondary to edema. Skin: Skin is cool to the  touch. Neuro: Alert and oriented x3.  No focal deficit noted. Psych: Appropriate mood and affect.   Patient Lines/Drains/Airways Status     Active Line/Drains/Airways     Name Placement date Placement time Site Days   Peripheral IV 10/28/20 20 G Right Antecubital 10/28/20  0746  Antecubital  2   PICC Double Lumen 10/28/20 PICC Right Cephalic 39 cm 0 cm 10/28/20  4540  -- 2             ASSESSMENT/PLAN:  Assessment: Active Problems:   Diabetes mellitus type 2 in obese (HCC)   Essential hypertension   OSA (obstructive sleep apnea)   Acute on chronic heart failure (HCC)   Goals of care, counseling/discussion   Plan: #Acute on chronic biventricular, low output failure Patient continues to have evidence of acute exacerbation of biventricular, low output heart failure.  He has 2+ pitting edema to the umbilicus and his extremities remain cool to the touch.  Co-ox testing continues to reveal poor perfusion: Total hemoglobin 15.4, O2 saturation 55%, carboxyhemoglobin 1.4%, methemoglobin 0.7%.  These are minimally changed from yesterday.  AKI is persistent with minimal improvements in BUN/Cr at 26/1.72. -Advanced heart failure team consulted, appreciate their recommendations  -Continue Lasix 20 mL/h  -Continue milrinone 13.95 mL/h  -Continue digoxin 0.125 mg daily  -Continue amiodarone 30 mL/h  -Add metolazone 2.5 mg  -Avoid beta-blockers due to  low output  -Continue IV heparin with daily heparin levels and CBC trending  -K goal greater than 4, Mg goal greater than 2   -Give an extra dose of K this morning as level was 3.2.  -Consider adding back Entresto and spironolactone this admission -Trend BMP -CVP monitoring -Palliative care consulted  #Uncontrolled type 2 diabetes mellitus Patient continues to have persistently elevated blood sugars during admission.  He was previously on Lantus 10 units with moderate SSI at mealtime.  Lantus was increased to 20 units today. -Given  persistent elevations in blood glucose, will add scheduled insulin: 5 units 3 times daily in addition to sliding scale insulin at mealtime. -Consider increasing Lantus to 25 units -CBG monitoring  #Cardiorenal syndrome Patient with AKI likely due to to low output heart failure.  BUN/creatinine of 26/1.72.  It seems that his baseline creatinine is 0.93. Urine output during this admission has been 4.7 to 5 L though only has recorded 0.7 L today so far. -Nephrology consulted, appreciate their recommendations -Trend BMP -Avoid nephrotoxic agents -Strict I's/O's -Continue IV Lasix and milrinone  #HTN #HLD Historically treated for hypertension with carvedilol 25 mg twice daily, Entresto 97-103 mg twice daily, spironolactone 25 mg daily.  We are currently holding those medications.  #OSA -BiPAP  #Hypervolemic hyponatremia Hyponatremia persists with value of 126 on morning labs.  This is likely secondary to volume overload from acute on chronic heart failure exacerbation.  We will continue to diurese. -Trend BMP -Restrict free water  Best Practice: Diet: Cardiac diet IVF: Fluids: None VTE: Heparin Code: Full AB: None DISPO: Continue to monitor inpatient.  Signature: Champ Mungo, D.O.  Internal Medicine Resident, PGY-1 [facility]  Pager: [phone] 3:06 PM, 10/30/2020   Please contact the on call pager after 5 pm and on weekends at [phone].

## 2020-10-30 NOTE — Progress Notes (Signed)
Patient refused PIV sticks for heparin lab. Cardiology and pharmacy made aware. Heparin was infusing in PICC line. RN changed the Heparin line to infuse in the PIV. RN drew Heparin lab from PICC.

## 2020-10-30 NOTE — Evaluation (Signed)
Physical Therapy Evaluation Patient Details Name: Jonathan [name] MRN: [id number] DOB: [birth year] Today's Date: 10/30/2020  History of Present Illness  Jonathan [name] is a 35 y.o. male who presents to [facility] with progression of the shortness of breath. He has associated orthopnea, abdominal bloating, decreased exercise tolerance, and lower extremity edema. He also endorses left lower quadrant abdominal pain with associated nausea.Acute on chronic HFrEF. PHMx: HFrEF, diabetes, hypertension, OSA,,  Clinical Impression  PTA pt living with wife in single story home with level entry. Pt reports independence with household ambulation, some times using RW and boot when R Charcot foot is bothering him. Pt requires assist from wife with lower body bathing and dressing. Pt is currently limited in safe mobility by his cardiopulmonary response to activity (resting HR in 140s, elevates to 160s with activity), in presence of decreased ROM, strength, balance and endurance. Pt supervision for transfers and min A for lateral stepping EoB due to LoB from decreased proprioception due to peripheral neuropathy. PT recommending HHPT at discharge for strength and balance. PT will continue to follow acutely.       Recommendations for follow up therapy are one component of a multi-disciplinary discharge planning process, led by the attending physician.  Recommendations may be updated based on patient status, additional functional criteria and insurance authorization.  Follow Up Recommendations Home health PT;Supervision for mobility/OOB    Equipment Recommendations  Other (comment);3in1 (PT)       Precautions / Restrictions Precautions Precautions: Fall Precaution Comments: fell prior to admission Required Braces or Orthoses: Other Brace Other Brace: uses boot on R LE when having more pain from Charcot foot Restrictions Weight Bearing Restrictions: No      Mobility  Bed Mobility               General bed  mobility comments: pt seated at EOB    Transfers Overall transfer level: Needs assistance Equipment used: None Transfers: Sit to/from Stand Sit to Stand: Supervision         General transfer comment: for safety  Ambulation/Gait Ambulation/Gait assistance: Min assist Gait Distance (Feet): 3 Feet Assistive device: None Gait Pattern/deviations: Step-to pattern;Decreased step length - right;Decreased step length - left;Shuffle Gait velocity: slowed Gait velocity interpretation: <1.31 ft/sec, indicative of household ambulator General Gait Details: light min A for steadying due to LoB with stepping towards HoB, pt reports he can not feel the floor and felt like he was pitching forward         Balance Overall balance assessment: Needs assistance   Sitting balance-Leahy Scale: Good     Standing balance support: No upper extremity supported Standing balance-Leahy Scale: Fair                               Pertinent Vitals/Pain Pain Assessment: Faces Faces Pain Scale: Hurts little more Pain Location: neck, abdomen Pain Descriptors / Indicators: Discomfort Pain Intervention(s): Monitored during session;Repositioned    Home Living Family/patient expects to be discharged to:: Private residence Living Arrangements: Spouse/significant other Available Help at Discharge: Family;Available PRN/intermittently (wife works 3 nights at [employer] as NT) Type of Home: House Home Access: Level entry     Home Layout: One level Home Equipment: Environmental consultant - 2 wheels Additional Comments: uneven flooring around toilet, CM inquiring if there is a bariatric BSC in which legs can be longer in front vs back    Prior Function Level of Independence: Needs assistance   Gait / Transfers  Assistance Needed: used RW as needed when R foot is painful  ADL's / Homemaking Assistance Needed: assisted for LB ADL, stands to shower        Hand Dominance   Dominant Hand: Right     Extremity/Trunk Assessment   Upper Extremity Assessment Upper Extremity Assessment: Defer to OT evaluation RUE Sensation: history of peripheral neuropathy RUE Coordination: decreased fine motor;decreased gross motor LUE Sensation: history of peripheral neuropathy LUE Coordination: decreased fine motor;decreased gross motor    Lower Extremity Assessment Lower Extremity Assessment: RLE deficits/detail;LLE deficits/detail RLE Deficits / Details: ROM limited by habitus and edema, ankle fusion limits dorsi/plantar flexion, strength grossly 3+/5 RLE: Unable to fully assess due to pain RLE Sensation: decreased light touch;decreased proprioception;history of peripheral neuropathy RLE Coordination: decreased fine motor LLE Deficits / Details: ROM limited by habitus and edema, ankle fusion limits dorsi/plantar flexion, strength grossly 3+/5 LLE: Unable to fully assess due to pain LLE Sensation: decreased light touch;decreased proprioception;history of peripheral neuropathy LLE Coordination: decreased fine motor    Cervical / Trunk Assessment Cervical / Trunk Assessment: Other exceptions Cervical / Trunk Exceptions: increased body habitus  Communication   Communication: No difficulties  Cognition Arousal/Alertness: Awake/alert Behavior During Therapy: Flat affect Overall Cognitive Status: Within Functional Limits for tasks assessed                                        General Comments General comments (skin integrity, edema, etc.): HR 106-163, pt with bilateral UNNA boots however bandaging coming undone, [employer] notified        Assessment/Plan    PT Assessment Patient needs continued PT services  PT Problem List Decreased strength;Decreased range of motion;Decreased activity tolerance;Decreased balance;Decreased mobility;Decreased coordination;Cardiopulmonary status limiting activity;Impaired sensation;Decreased skin integrity;Pain;Obesity       PT Treatment  Interventions DME instruction;Gait training;Functional mobility training;Therapeutic activities;Therapeutic exercise;Balance training;Cognitive remediation;Patient/family education    PT Goals (Current goals can be found in the Care Plan section)  Acute Rehab PT Goals Patient Stated Goal: to keep peace in his family PT Goal Formulation: With patient/family Time For Goal Achievement: 11/13/20 Potential to Achieve Goals: Fair    Frequency Min 3X/week        Co-evaluation PT/OT/SLP Co-Evaluation/Treatment: Yes Reason for Co-Treatment: Complexity of the patient's impairments (multi-system involvement) PT goals addressed during session: Mobility/safety with mobility         AM-PAC PT "6 Clicks" Mobility  Outcome Measure Help needed turning from your back to your side while in a flat bed without using bedrails?: None Help needed moving from lying on your back to sitting on the side of a flat bed without using bedrails?: None Help needed moving to and from a bed to a chair (including a wheelchair)?: A Little Help needed standing up from a chair using your arms (e.g., wheelchair or bedside chair)?: None Help needed to walk in hospital room?: A Little Help needed climbing 3-5 steps with a railing? : A Lot 6 Click Score: 20    End of Session   Activity Tolerance: Patient limited by pain;Patient limited by fatigue;Treatment limited secondary to medical complications (Comment) (increased HR) Patient left: in bed;with call bell/phone within reach;with family/visitor present;Other (comment) (sitting EoB) Nurse Communication: Mobility status;Other (comment) (need for air mattress) PT Visit Diagnosis: Unsteadiness on feet (R26.81);Other abnormalities of gait and mobility (R26.89);Muscle weakness (generalized) (M62.81);History of falling (Z91.81);Difficulty in walking, not elsewhere classified (R26.2);Dizziness and  giddiness (R42);Other symptoms and signs involving the nervous system  (R29.898);Pain Pain - Right/Left:  (bilateral) Pain - part of body: Leg (abdomen)    Time: 9417-4081 PT Time Calculation (min) (ACUTE ONLY): 24 min   Charges:   PT Evaluation $PT Eval Moderate Complexity: 1 Mod          Mairany Bruno B. Beverely Risen PT, DPT Acute Rehabilitation Services Pager [phone] Office [phone]   [name] 10/30/2020, 10:42 AM

## 2020-10-30 NOTE — Progress Notes (Signed)
Pt has home CPAP.  

## 2020-10-30 NOTE — Progress Notes (Signed)
ANTICOAGULATION CONSULT NOTE - Follow Up Consult  Pharmacy Consult for Heparin Indication: atrial fibrillation  Allergies  Allergen Reactions   Hydrocodone     Hives     Patient Measurements: Height: 6\' 2"  (188 cm) Weight: (!) 185.2 kg (408 lb 6.4 oz) IBW/kg (Calculated) : 82.2 Heparin Dosing Weight:  127.7kg  Vital Signs: Temp: 98.1 F (36.7 C) (09/15 2040) Temp Source: Oral (09/15 2040) BP: 152/117 (09/15 2040) Pulse Rate: 130 (09/15 2040)  Labs: Recent Labs    10/28/20 0313 10/28/20 1330 10/29/20 0611 10/29/20 1804 10/30/20 0500 10/30/20 1600  HGB 15.9 18.7* 15.0  --  15.0  --   HCT 50.1 55.0* 44.6  --  45.3  --   PLT 261  --  218  --  237  --   HEPARINUNFRC  --   --   --  <0.10* 0.36 0.38  CREATININE 1.63*  --  1.79* 1.76* 1.72*  --     Estimated Creatinine Clearance: 104.6 mL/min (A) (by C-G formula based on SCr of 1.72 mg/dL (H)).   Assessment: Anticoag: enox40>>hep gtt for a fib, hgb 15, plts 218 - Hep levels 0.36 and 0.38 both in goal range.  Goal of Therapy:  Heparin level 0.3-0.7 units/ml Monitor platelets by anticoagulation protocol: Yes   Plan:  Con't IV heparin 2250 units/hr Daily HL and CBC   Vernestine Brodhead S. 11/01/20, PharmD, BCPS Clinical Staff Pharmacist [email or web address]  Merilynn Finland, Darian Ace Stillinger 10/30/2020,9:15 PM

## 2020-10-30 NOTE — TOC Progression Note (Addendum)
Transition of Care [facility]) - Progression Note    Patient Details  Name: Jonathan [name] MRN: [id number] Date of Birth: [birth year]  [facility] CM/SW Contact  Jonathan [name], LCSWA Phone Number: 10/30/2020, 2:58 PM  Clinical Narrative:    CSW spoke with the patient at bedside and completed a very brief SDOH with the patient who denied having any needs at this time. Jonathan [name] asked the social worker to come back as he received a call from his wife. CSW came back to talk with Jonathan [name] and he reported wanting a CGM for his diabetes instead of always having to prick his finger. CSW will have the RNCM to follow up with Jonathan [name] about the CGM. Patient reported they do have a PCP and they can get to the pharmacy to pick up their medications. CSW provided the patient with the social workers name and position and if anything changes to please reach out so that CSW can provide support.  CSW will continue to follow throughout discharge.      Expected Discharge Plan and Services                                                 Social Determinants of Health (SDOH) Interventions Food Insecurity Interventions: Intervention Not Indicated Financial Strain Interventions: Intervention Not Indicated Housing Interventions: Intervention Not Indicated Transportation Interventions: Intervention Not Indicated  Readmission Risk Interventions No flowsheet data found.  Jonathan [name], MSW, LCSWA [phone] Heart Failure Social Worker

## 2020-10-30 NOTE — Progress Notes (Addendum)
Advanced Heart Failure Rounding Note  PCP-Cardiologist: Armanda Magic, MD  HF MD: Dr. Gala Romney   Patient Profile   Mr. Coppola is a 35 year old male with history of HFrEF, NICM, HTN, asthma, OSA, morbid obesity, and uncontrolled DM. Hx of medical noncompliance. Admitted on 09/13 with acute on chronic systolic HF, severe hyperglycemia, AKI and abdominal pain.    Subjective:    09/13: Started on Empiric milrinone 0.25, lasix gtt at 10/hr 09/14: Developed AFL w/ RVR>>amio gtt started   Remains on Milrinone 0.25. Co-ox marginal 55%. Lasix gtt now at 20/hr. 4L in UOP yesterday.  Wt down 3 lb. CVP 24 (sitting up). SCr stable at 1.7 K 3.2  Mg 1.8  Na 126  Dig level <0.2  Remains tachycardic 120s. ?ST vs 2:1 AFL. BPs mildly elevated.   Feels weak today. No resting dyspnea. + exertional dyspnea.    Cardiac Studies   Echo 09/22: EF < 20%, RV fxn moderately reduced, trivial MR, mild TR Echo 10/21: EF 30-35% with moderate RV dysfunction in setting of recurrent RBBB with significant dyssynchrony.  ECHO 04/2019: EF 30-35%  ECHO 2019: EF 55%   Echo EF 15-20% in [place] in 2017. Cath around that time without any significant CAD per patient.  Objective:   Weight Range: (!) 185.2 kg Body mass index is 52.44 kg/m.   Vital Signs:   Temp:  [97.7 F (36.5 C)-98.7 F (37.1 C)] 97.8 F (36.6 C) (09/15 0726) Pulse Rate:  [64-123] 118 (09/15 0929) Resp:  [15-20] 20 (09/15 0726) BP: (140-147)/(98-113) 144/98 (09/15 0726) SpO2:  [91 %-98 %] 92 % (09/15 0726) Weight:  [185.2 kg] 185.2 kg (09/15 0552) Last BM Date: 10/29/20  Weight change: Filed Weights   10/28/20 1935 10/29/20 0405 10/30/20 0552  Weight: (!) 186 kg (!) 186.6 kg (!) 185.2 kg    Intake/Output:   Intake/Output Summary (Last 24 hours) at 10/30/2020 0932 Last data filed at 10/30/2020 0900 Gross per 24 hour  Intake 960 ml  Output 4100 ml  Net -3140 ml      Physical Exam   CVP 24 General:  super morbidly obese [name].  No respiratory difficulty HEENT: normal Neck: supple. JVD elevated to jaw. Carotids 2+ bilat; no bruits. No lymphadenopathy or thyromegaly appreciated. Cor: PMI nondisplaced. Regular rhythm, tachy rate. No rubs, gallops or murmurs. Lungs: decreased BS at the bases  Abdomen: obese, edematous, soft, nontender, mildly distended. No hepatosplenomegaly. No bruits or masses. Good bowel sounds. Extremities: no cyanosis, clubbing, rash, 3+ bilateral LEE pitting edema, + UNNA boots Neuro: alert & oriented x 3, cranial nerves grossly intact. moves all 4 extremities w/o difficulty. Affect pleasant.   Telemetry   Tachycardia 120s, ? ST vs 2:1 AFL   EKG    No new EKG to review   Labs    CBC Recent Labs    10/28/20 0313 10/28/20 1330 10/29/20 0611 10/30/20 0500  WBC 8.2  --  7.2 8.1  NEUTROABS 4.3  --   --   --   HGB 15.9   < > 15.0 15.0  HCT 50.1   < > 44.6 45.3  MCV 95.8  --  91.2 91.3  PLT 261  --  218 237   < > = values in this interval not displayed.   Basic Metabolic Panel Recent Labs    99/24/26 2350 10/29/20 0611 10/29/20 1804 10/30/20 0500  NA  --  127* 126* 126*  K  --  4.1 4.0 3.2*  CL  --  90* 87* 84*  CO2  --  26 23 25   GLUCOSE  --  221* 250* 399*  BUN  --  28* 28* 26*  CREATININE  --  1.79* 1.76* 1.72*  CALCIUM  --  8.7* 8.9 8.4*  MG 2.0 1.9  --  1.8  PHOS 5.1*  --   --   --    Liver Function Tests Recent Labs    10/29/20 0611  AST 30  ALT 26  ALKPHOS 91  BILITOT 1.8*  PROT 6.5  ALBUMIN 2.8*   No results for input(s): LIPASE, AMYLASE in the last 72 hours. Cardiac Enzymes No results for input(s): CKTOTAL, CKMB, CKMBINDEX, TROPONINI in the last 72 hours.  BNP: BNP (last 3 results) Recent Labs    05/01/20 1020 09/11/20 0951 10/28/20 1315  BNP 862.1* 803.3* 1,547.7*    ProBNP (last 3 results) No results for input(s): PROBNP in the last 8760 hours.   D-Dimer No results for input(s): DDIMER in the last 72 hours. Hemoglobin A1C No results  for input(s): HGBA1C in the last 72 hours. Fasting Lipid Panel No results for input(s): CHOL, HDL, LDLCALC, TRIG, CHOLHDL, LDLDIRECT in the last 72 hours. Thyroid Function Tests No results for input(s): TSH, T4TOTAL, T3FREE, THYROIDAB in the last 72 hours.  Invalid input(s): FREET3  Other results:   Imaging    No results found.   Medications:     Scheduled Medications:  Chlorhexidine Gluconate Cloth  6 each Topical Daily   digoxin  0.125 mg Oral Daily   insulin aspart  0-15 Units Subcutaneous TID WC   insulin glargine-yfgn  20 Units Subcutaneous Daily   loratadine  10 mg Oral Daily   melatonin  3 mg Oral QHS   potassium chloride  40 mEq Oral Daily   sodium chloride flush  10-40 mL Intracatheter Q12H   sodium chloride flush  3 mL Intravenous Q12H    Infusions:  amiodarone 30 mg/hr (10/30/20 0920)   furosemide (LASIX) 200 mg in dextrose 5% 100 mL (2mg /mL) infusion 20 mg/hr (10/30/20 0923)   heparin 2,250 Units/hr (10/30/20 0919)   milrinone 0.25 mcg/kg/min (10/30/20 0437)   sodium chloride      PRN Medications: acetaminophen **OR** acetaminophen, alum & mag hydroxide-simeth, sodium chloride flush  Assessment/Plan   A/C HFrEF, NICM  -Most recent ECHO back in 2021 EF 30-35 % with moderate RV dysfunction in the setting of recurrent RBBB with significant dyssynchrony. Echo this admit with EF < 20% and moderate RV dysfunction - Marked volume overload w/ low output. On 0.25 milrinone. Co-ox 55% today  - CVP >20. C/w Lasix gtt at 20/hr + add 2.5 mg of metolazone, Supp K and Mg  - Continue dig 0.125 mg daily.  Dig level ok. - No bb with low output.  - Intolerant SGLT2i due to candidiasis +uncontrolled DM - Consider adding back entresto and spiro this admit - Not a candidate for advanced therapies with Hgb A1C >14 and noncompliance. Milrinone only a short-term solution.   2. Uncontrolled DM -9/1 Hgb A1C > 14.  -Per primary team.    3. AKI -Baseline creatinine < 1.  -  Creatinine 1.6> 1.79>1.72.   - 2/2 low output heart failure/ cardiorenal .  - continue diuresis + inotrope supp - follow BMP    4. Atrial Flutter  - c/w amio gtt - Heparin gtt - Supp K and Mg    5. Abdominal Pain  -CT with sludge in gall bladder.  -?  Low output heart failure    6. Hypervolemic Hyponatremia  - Na 125 > 127>126  - Restrict free water.  - Diurese   7. OSA Severe OSA AHI 65 -  Bipap QHS   8. Morbid Obesity  Body mass index is 52.44 kg/m.  9. H/O medical noncompliance -Reports taking HF medications prior to admit. Missed last few clinic f/u and discharged from HF paramedicine d/t noncompliance.  -Consider HF paramedicine at discharge if willing  10. Hypokalemia/ Hypomagnesemia - K 3.2, Mg 1.8  - increase KCl to 40 meq bib - give 4 gm MgSO4   Length of Stay: 2  Robbie Lis, PA-C  10/30/2020, 9:32 AM  Advanced Heart Failure Team Pager [phone] (M-F; 7a - 5p)  Please contact [facility] for night-coverage after hours (5p -7a ) and weekends on [email or web address]   Patient seen and examined with the above-signed Advanced Practice Provider and/or Housestaff. I personally reviewed laboratory data, imaging studies and relevant notes. I independently examined the patient and formulated the important aspects of the plan. I have edited the note to reflect any of my changes or salient points. I have personally discussed the plan with the patient and/or family.  Remains on milrinone.  Co-ox improved but still marginal at 55%. Put out > 4L on lasix gtt. CVP still > 20. Feels fatigued. Remains tachy but appears to be out of AFL on IV amio. Tolerating heparin. Renal function stable. Sodium 126  General:  Markedly obese male No resp difficulty HEENT: normal Neck: supple. JVP to ear Carotids 2+ bilat; no bruits. No lymphadenopathy or thryomegaly appreciated. Cor: PMI non-palpable Regular rate & rhythm. +s3. Lungs: clear Abdomen: soft, nontender, + distended. No bruits  or masses. Good bowel sounds. Extremities: no cyanosis, clubbing, rash, 3+ edema + UNNA  Neuro: alert & orientedx3, cranial nerves grossly intact. moves all 4 extremities w/o difficulty. Flat affect  Remains very tenuous. Will continue milrinone support and diuresis. Agree with metolazone. May need to increase lasix gtt to 30. Continue amio for AFL. Prognosis very tenuous.   Arvilla Meres, MD  10:34 AM

## 2020-10-30 NOTE — TOC Progression Note (Signed)
Transition of Care [facility]) - Progression Note    Patient Details  Name: Jonathan [name] MRN: [id number] Date of Birth: 04/19/1985  [facility]) CM/SW Contact  Leone Haven, RN Phone Number: 10/30/2020, 9:55 AM  Clinical Narrative:    NCM was notified by the Occupational therapist that patient needs a bariatric BSC with adjustable legs.  NCM made referral to [facility] with [employer], this will be brought up to patient's room.        Expected Discharge Plan and Services                                                 Social Determinants of Health (SDOH) Interventions    Readmission Risk Interventions No flowsheet data found.

## 2020-10-30 NOTE — Evaluation (Signed)
Occupational Therapy Evaluation Patient Details Name: Jonathan [name] MRN: [id number] DOB: 1986-01-29 Today's Date: 10/30/2020   History of Present Illness Jonathan [name] is a 35 y.o. male who presents to [facility] with progression of the shortness of breath. He has associated orthopnea, abdominal bloating, decreased exercise tolerance, and lower extremity edema. He also endorses left lower quadrant abdominal pain with associated nausea.Acute on chronic HFrEF. PHMx: HFrEF, diabetes, hypertension, OSA,,   Clinical Impression   Pt lives with his supportive wife who works as a NT 3 nights per week. He uses a RW and boot on his R foot as needed when ambulating. He stands to shower. His wife assists with LB ADL and this works for both the wife and pt. Pt presents with elevated resting HR, up to 146, limiting evaluation of mobility this visit. He stood and took several steps with supervision for safety. Will follow acutely to educate in use multiple uses of  BSC, energy conservation strategies and ADL training.      Recommendations for follow up therapy are one component of a multi-disciplinary discharge planning process, led by the attending physician.  Recommendations may be updated based on patient status, additional functional criteria and insurance authorization.   Follow Up Recommendations  No OT follow up    Equipment Recommendations  3 in 1 bedside commode (bariatric with individual height adjustable legs)    Recommendations for Other Services       Precautions / Restrictions Precautions Precautions: Fall Precaution Comments: fell prior to admission Required Braces or Orthoses: Other Brace Other Brace: uses boot on R LE when having more pain from Charcot foot      Mobility Bed Mobility               General bed mobility comments: pt seated at EOB    Transfers Overall transfer level: Needs assistance Equipment used: None Transfers: Sit to/from Stand Sit to Stand: Supervision          General transfer comment: for safety    Balance Overall balance assessment: Needs assistance   Sitting balance-Leahy Scale: Good     Standing balance support: No upper extremity supported Standing balance-Leahy Scale: Fair                             ADL either performed or assessed with clinical judgement   ADL Overall ADL's : Needs assistance/impaired Eating/Feeding: Independent;Sitting   Grooming: Set up;Sitting   Upper Body Bathing: Minimal assistance;Sitting   Lower Body Bathing: Maximal assistance;Sit to/from stand   Upper Body Dressing : Set up;Sitting   Lower Body Dressing: Maximal assistance;Sit to/from stand   Toilet Transfer: Supervision/safety;Stand-pivot   Toileting- Architect and Hygiene: Supervision/safety;Sit to/from stand               Vision Baseline Vision/History: 0 No visual deficits       Perception     Praxis      Pertinent Vitals/Pain Pain Assessment: Faces Faces Pain Scale: Hurts little more Pain Location: neck, abdomen Pain Descriptors / Indicators: Discomfort Pain Intervention(s): Monitored during session;Repositioned     Hand Dominance Right   Extremity/Trunk Assessment Upper Extremity Assessment Upper Extremity Assessment: RUE deficits/detail;LUE deficits/detail RUE Sensation: history of peripheral neuropathy RUE Coordination: decreased fine motor;decreased gross motor LUE Sensation: history of peripheral neuropathy LUE Coordination: decreased fine motor;decreased gross motor   Lower Extremity Assessment Lower Extremity Assessment: Defer to PT evaluation   Cervical / Trunk Assessment Cervical /  Trunk Assessment: Other exceptions Cervical / Trunk Exceptions: increased body habitus   Communication Communication Communication: No difficulties   Cognition Arousal/Alertness: Awake/alert Behavior During Therapy: Flat affect Overall Cognitive Status: Within Functional Limits for tasks  assessed                                     General Comments       Exercises     Shoulder Instructions      Home Living Family/patient expects to be discharged to:: Private residence Living Arrangements: Spouse/significant other Available Help at Discharge: Family;Available PRN/intermittently (wife works 3 nights at [employer] as NT) Type of Home: House Home Access: Level entry     Home Layout: One level     Bathroom Shower/Tub: Producer, television/film/video: Standard     Home Equipment: Environmental consultant - 2 wheels   Additional Comments: uneven flooring around toilet, CM inquiring if there is a bariatric BSC in which legs can be longer in front vs back      Prior Functioning/Environment Level of Independence: Needs assistance  Gait / Transfers Assistance Needed: used RW as needed when R foot is painful ADL's / Homemaking Assistance Needed: assisted for LB ADL, stands to shower            OT Problem List: Decreased activity tolerance;Impaired balance (sitting and/or standing);Decreased knowledge of use of DME or AE;Obesity;Pain;Cardiopulmonary status limiting activity      OT Treatment/Interventions: Self-care/ADL training;DME and/or AE instruction;Therapeutic activities;Patient/family education;Balance training;Energy conservation    OT Goals(Current goals can be found in the care plan section) Acute Rehab OT Goals Patient Stated Goal: to keep peace in his family OT Goal Formulation: With patient Time For Goal Achievement: 11/13/20 Potential to Achieve Goals: Good ADL Goals Pt Will Perform Grooming: with modified independence;standing Pt Will Transfer to Toilet: with modified independence;ambulating;bedside commode Pt Will Perform Toileting - Clothing Manipulation and hygiene: with modified independence;sit to/from stand Additional ADL Goal #1: Pt will generalize energy conservation strategies in ADL and mobility. Additional ADL Goal #2: Pt will be  knowledgeable in multiple uses of 3 in 1 for toileting and showering.  OT Frequency: Min 2X/week   Barriers to D/C:            Co-evaluation              AM-PAC OT "6 Clicks" Daily Activity     Outcome Measure Help from another person eating meals?: None Help from another person taking care of personal grooming?: A Little Help from another person toileting, which includes using toliet, bedpan, or urinal?: A Little Help from another person bathing (including washing, rinsing, drying)?: A Lot Help from another person to put on and taking off regular upper body clothing?: A Little Help from another person to put on and taking off regular lower body clothing?: A Lot 6 Click Score: 17   End of Session    Activity Tolerance: Treatment limited secondary to medical complications (Comment) (pt with intermittent elevated HR to 140s at rest) Patient left: in bed;with family/visitor present;with nursing/sitter in room  OT Visit Diagnosis: Pain;Unsteadiness on feet (R26.81);Other abnormalities of gait and mobility (R26.89)                Time: 1062-6948 OT Time Calculation (min): 27 min Charges:  OT General Charges $OT Visit: 1 Visit OT Evaluation $OT Eval Moderate Complexity: 1 Mod  Martie Round, OTR/L  Acute Rehabilitation Services Pager: [phone] Office: [phone]   Evern Bio 10/30/2020, 9:57 AM

## 2020-10-30 NOTE — Progress Notes (Addendum)
Inpatient Diabetes Program Recommendations  AACE/ADA: New Consensus Statement on Inpatient Glycemic Control (2015)  Target Ranges:  Prepandial:   less than 140 mg/dL      Peak postprandial:   less than 180 mg/dL (1-2 hours)      Critically ill patients:  140 - 180 mg/dL   Lab Results  Component Value Date   GLUCAP 261 (H) 10/30/2020   HGBA1C >14.0 (A) 10/16/2020    Review of Glycemic Control Results for FAY, SWIDER (MRN [id number]) as of 10/30/2020 12:54  Ref. Range 10/29/2020 15:37 10/29/2020 21:06 10/30/2020 00:23 10/30/2020 04:35 10/30/2020 07:32 10/30/2020 12:20  Glucose-Capillary Latest Ref Range: 70 - 99 mg/dL 932 (H) 671 (H) 245 (H) 237 (H) 241 (H) 261 (H)   Diabetes history: DM 2 Outpatient Diabetes medications:  Trulicity 1.5 mg weekly Current orders for Inpatient glycemic control:  Novolog moderate tid with meals Semglee 20 units daily  Inpatient Diabetes Program Recommendations:   Consider increasing Semglee to 25 units daily.  Will talk to patient about insulin today.  It appears he has been told several times that he needs insulin but he has refused.    Thanks  Beryl Meager, RN, BC-ADM Inpatient Diabetes Coordinator Pager [phone]  (8a-5p)  Addendum:  Spoke with patient about DM management.  Discussed the need for insulin at discharge.  He states he is still "thinking" about insulin and whether he wants to take it.  His wife gives him his Trulicity weekly.  I asked patient if he thought she would give him his insulin and he states "maybe".  Showed patient insulin pen and how it is similar to Trulicity pen.  He states-"I still do not like the needle".  Encouraged him to take insulin so that he will feel better and for overall better control of his DM.

## 2020-10-31 ENCOUNTER — Inpatient Hospital Stay (HOSPITAL_COMMUNITY): Payer: Medicaid Other

## 2020-10-31 DIAGNOSIS — I10 Essential (primary) hypertension: Secondary | ICD-10-CM | POA: Diagnosis not present

## 2020-10-31 DIAGNOSIS — Z7189 Other specified counseling: Secondary | ICD-10-CM | POA: Diagnosis not present

## 2020-10-31 DIAGNOSIS — Z515 Encounter for palliative care: Secondary | ICD-10-CM | POA: Diagnosis not present

## 2020-10-31 DIAGNOSIS — R609 Edema, unspecified: Secondary | ICD-10-CM

## 2020-10-31 DIAGNOSIS — I509 Heart failure, unspecified: Secondary | ICD-10-CM | POA: Diagnosis not present

## 2020-10-31 DIAGNOSIS — E669 Obesity, unspecified: Secondary | ICD-10-CM | POA: Diagnosis not present

## 2020-10-31 DIAGNOSIS — G4733 Obstructive sleep apnea (adult) (pediatric): Secondary | ICD-10-CM | POA: Diagnosis not present

## 2020-10-31 DIAGNOSIS — E1169 Type 2 diabetes mellitus with other specified complication: Secondary | ICD-10-CM | POA: Diagnosis not present

## 2020-10-31 DIAGNOSIS — M79601 Pain in right arm: Secondary | ICD-10-CM

## 2020-10-31 DIAGNOSIS — I5023 Acute on chronic systolic (congestive) heart failure: Secondary | ICD-10-CM | POA: Diagnosis not present

## 2020-10-31 LAB — CBC
HCT: 44.7 % (ref 39.0–52.0)
Hemoglobin: 14.6 g/dL (ref 13.0–17.0)
MCH: 29.7 pg (ref 26.0–34.0)
MCHC: 32.7 g/dL (ref 30.0–36.0)
MCV: 90.9 fL (ref 80.0–100.0)
Platelets: 228 10*3/uL (ref 150–400)
RBC: 4.92 MIL/uL (ref 4.22–5.81)
RDW: 14.3 % (ref 11.5–15.5)
WBC: 7 10*3/uL (ref 4.0–10.5)
nRBC: 0 % (ref 0.0–0.2)

## 2020-10-31 LAB — GLUCOSE, CAPILLARY
Glucose-Capillary: 252 mg/dL — ABNORMAL HIGH (ref 70–99)
Glucose-Capillary: 256 mg/dL — ABNORMAL HIGH (ref 70–99)
Glucose-Capillary: 249 mg/dL — ABNORMAL HIGH (ref 70–99)
Glucose-Capillary: 277 mg/dL — ABNORMAL HIGH (ref 70–99)

## 2020-10-31 LAB — BASIC METABOLIC PANEL
Anion gap: 16 — ABNORMAL HIGH (ref 5–15)
Anion gap: 10 (ref 5–15)
BUN: 28 mg/dL — ABNORMAL HIGH (ref 6–20)
BUN: 25 mg/dL — ABNORMAL HIGH (ref 6–20)
CO2: 31 mmol/L (ref 22–32)
CO2: 27 mmol/L (ref 22–32)
Calcium: 9.5 mg/dL (ref 8.9–10.3)
Calcium: 7.3 mg/dL — ABNORMAL LOW (ref 8.9–10.3)
Chloride: 83 mmol/L — ABNORMAL LOW (ref 98–111)
Chloride: 94 mmol/L — ABNORMAL LOW (ref 98–111)
Creatinine, Ser: 1.73 mg/dL — ABNORMAL HIGH (ref 0.61–1.24)
Creatinine, Ser: 1.48 mg/dL — ABNORMAL HIGH (ref 0.61–1.24)
GFR, Estimated: 52 mL/min — ABNORMAL LOW (ref >60)
GFR, Estimated: >60 mL/min (ref >60)
Glucose, Bld: 246 mg/dL — ABNORMAL HIGH (ref 70–99)
Glucose, Bld: 246 mg/dL — ABNORMAL HIGH (ref 70–99)
Potassium: 2.8 mmol/L — ABNORMAL LOW (ref 3.5–5.1)
Potassium: 2.4 mmol/L — CL (ref 3.5–5.1)
Sodium: 130 mmol/L — ABNORMAL LOW (ref 135–145)
Sodium: 131 mmol/L — ABNORMAL LOW (ref 135–145)

## 2020-10-31 LAB — COOXEMETRY PANEL
Carboxyhemoglobin: 1.2 % (ref 0.5–1.5)
Methemoglobin: 0.6 % (ref 0.0–1.5)
O2 Saturation: 57 %
Total hemoglobin: 15.2 g/dL (ref 12.0–16.0)

## 2020-10-31 LAB — HEPARIN LEVEL (UNFRACTIONATED): Heparin Unfractionated: 0.33 IU/mL (ref 0.30–0.70)

## 2020-10-31 LAB — MAGNESIUM: Magnesium: 2.1 mg/dL (ref 1.7–2.4)

## 2020-10-31 MED ORDER — POTASSIUM CHLORIDE 10 MEQ/50ML IV SOLN
10.0000 meq | INTRAVENOUS | Status: AC
  Administered 2020-10-31 (×4): 10 meq via INTRAVENOUS
  Filled 2020-10-31 (×4): qty 50

## 2020-10-31 MED ORDER — INSULIN GLARGINE-YFGN 100 UNIT/ML ~~LOC~~ SOLN
25.0000 [IU] | Freq: Every day | SUBCUTANEOUS | Status: DC
Start: 2020-10-31 — End: 2020-11-01
  Administered 2020-10-31 – 2020-11-01 (×2): 25 [IU] via SUBCUTANEOUS
  Filled 2020-10-31 (×2): qty 0.25

## 2020-10-31 MED ORDER — METOLAZONE 2.5 MG PO TABS
2.5000 mg | ORAL_TABLET | Freq: Once | ORAL | Status: AC
  Administered 2020-10-31: 2.5 mg via ORAL
  Filled 2020-10-31: qty 1

## 2020-10-31 MED ORDER — SODIUM CHLORIDE 0.9 % IV SOLN
0.1500 mg/kg/h | INTRAVENOUS | Status: AC
  Administered 2020-10-31: 0.15 mg/kg/h via INTRAVENOUS
  Filled 2020-10-31 (×2): qty 250

## 2020-10-31 MED ORDER — POTASSIUM CHLORIDE CRYS ER 20 MEQ PO TBCR
40.0000 meq | EXTENDED_RELEASE_TABLET | ORAL | Status: AC
  Administered 2020-10-31 – 2020-11-01 (×3): 40 meq via ORAL
  Filled 2020-10-31 (×4): qty 2

## 2020-10-31 MED ORDER — POTASSIUM CHLORIDE CRYS ER 20 MEQ PO TBCR
40.0000 meq | EXTENDED_RELEASE_TABLET | Freq: Two times a day (BID) | ORAL | Status: DC
  Administered 2020-10-31: 40 meq via ORAL
  Filled 2020-10-31: qty 2

## 2020-10-31 MED ORDER — SPIRONOLACTONE 12.5 MG HALF TABLET
12.5000 mg | ORAL_TABLET | Freq: Every day | ORAL | Status: DC
  Administered 2020-10-31: 12.5 mg via ORAL
  Filled 2020-10-31: qty 1

## 2020-10-31 MED ORDER — INSULIN ASPART 100 UNIT/ML IJ SOLN
7.0000 [IU] | Freq: Three times a day (TID) | INTRAMUSCULAR | Status: DC
  Administered 2020-10-31 – 2020-11-01 (×3): 7 [IU] via SUBCUTANEOUS

## 2020-10-31 MED ORDER — POTASSIUM CHLORIDE CRYS ER 20 MEQ PO TBCR
40.0000 meq | EXTENDED_RELEASE_TABLET | Freq: Once | ORAL | Status: AC
  Administered 2020-10-31: 40 meq via ORAL
  Filled 2020-10-31: qty 2

## 2020-10-31 MED ORDER — SODIUM CHLORIDE 0.9 % IV SOLN
0.1500 mg/kg/h | INTRAVENOUS | Status: DC
  Filled 2020-10-31: qty 250

## 2020-10-31 NOTE — Consult Note (Signed)
Palliative Care Consult Note                                  Date: 10/31/2020   Patient Name: Jonathan [name]  DOB: 02-11-1986  MRN: [id number]  Age / Sex: 35 y.o., male  PCP: [name], MD Referring Physician: Sid Falcon, MD  Reason for Consultation: Establishing goals of care  HPI/Patient Profile: Palliative Care consult requested for goals of care discussion in this 35 y.o. male  with past medical history of CHF, asthma, obesity, uncontrolled diabetes, EF 30-35% (11/2019), hypertension, and OSA. He was admitted on 10/28/2020 from home with complaints of abdominal pain and shortness of breath. Patient is being followed by the Heart Failure team. Started on empiric milrinone, amio gtt, and IV lasix. Echo now showing EF <20%.   Past Medical History:  Diagnosis Date  . Acute pain of left foot 05/03/2017  . Asthma   . Asthma, chronic, mild persistent, uncomplicated 50/56/9794  . Charcot ankle, left 05/31/2017  . CHF (congestive heart failure) ([place])   . Chronic systolic (congestive) heart failure ([place])   . Diabetes mellitus type 2 in obese ([place]) 04/21/2014  . Diabetic polyneuropathy associated with type 2 diabetes mellitus ([place]) 03/14/2017  . Essential hypertension 10/26/2016  . Hypertension   . Obesity   . Obesity, Class III, BMI 40-49.9 (morbid obesity) ([place]) 10/26/2016  . OSA (obstructive sleep apnea) 12/02/2016  . Type 2 diabetes mellitus with diabetic neuropathy ([facility])      Subjective:   This NP Jonathan [name] reviewed medical records, received report from team, assessed the patient and then met at the patient's bedside with patient, his wife Jonathan [name]), brother Jonathan [name]) and sister Jonathan [name]) to discuss diagnosis, prognosis, GOC, EOL wishes disposition and options.  Patient is sitting up on the side of the bed laughing and joking with family. Denies pain. Eating lunch.    Concept of Palliative Care was introduced as  specialized medical care for people and their families living with serious illness.  It focuses on providing relief from the symptoms and stress of a serious illness.  The goal is to improve quality of life for both the patient and the family. Values and goals of care important to patient and family were attempted to be elicited.  I created space and opportunity for patient and family to explore state of health prior to admission, thoughts, and feelings. Patient shares he and his wife has been together for 8 years and married for 4 years. They have no children but he does have a dog. He and his wife met at the homeless shelter.   Prior to admission patient states he was doing fairly well. Would spend most of his time in the home sitting up in the chair. Used a walker as needed. Appetite has been fair with some noticeable decrease. He shares he has no concerns with taking his medications although does have trouble getting them at times.   We discussed His current illness and what it means in the larger context of His on-going co-morbidities. Natural disease trajectory and expectations were discussed.  Patient and his family verbalized understanding of current illness and co-morbidities. [name] was able to summarize his understanding of his health sharing he knows his heart is worst "to the point of no return!" When asked how he felt he laughed and said "it is what it is!"   Wife and family also expressed their  understanding that patient's condition is end-stage. Wife is hopeful for some stability with current medical interventions but aware any response is only temporary and does not change his overall poor prognosis.   I discussed the importance of continued conversation with family and their medical providers regarding overall plan of care and treatment options, ensuring decisions are within the context of the patients values and GOCs.  Questions and concerns were addressed. The patient and family was  encouraged to call with questions or concerns.  PMT will continue to support holistically as needed.  Life Review: Patient is married. No children, 1 dog. He and wife met in the homeless shelter. Wife is a Chartered certified accountant here at [employer]. Patient has never worked. He is #2 of 10 siblings. Originally from [place]. His parents are still alive and remain in [place] with many of his family members and siblings.    Objective:   Primary Diagnoses: Present on Admission: . Diabetes mellitus type 2 in obese ([place]) . Essential hypertension . OSA (obstructive sleep apnea)   Scheduled Meds: . Chlorhexidine Gluconate Cloth  6 each Topical Daily  . digoxin  0.125 mg Oral Daily  . insulin aspart  0-15 Units Subcutaneous TID WC  . insulin aspart  7 Units Subcutaneous TID WC  . insulin glargine-yfgn  25 Units Subcutaneous Daily  . loratadine  10 mg Oral Daily  . melatonin  3 mg Oral QHS  . potassium chloride  40 mEq Oral BID  . sodium chloride flush  10-40 mL Intracatheter Q12H  . sodium chloride flush  3 mL Intravenous Q12H  . spironolactone  12.5 mg Oral Daily    Continuous Infusions: . amiodarone 30 mg/hr (10/31/20 0656)  . furosemide (LASIX) 200 mg in dextrose 5% 100 mL (22m/mL) infusion 20 mg/hr (10/31/20 0625)  . heparin 2,250 Units/hr (10/31/20 1036)  . milrinone 0.25 mcg/kg/min (10/31/20 1413)  . sodium chloride      PRN Meds: acetaminophen **OR** acetaminophen, alum & mag hydroxide-simeth, sodium chloride flush  Allergies  Allergen Reactions  . Hydrocodone     Hives     Review of Systems  Respiratory:  Positive for shortness of breath.   Neurological:  Positive for weakness.  Unless otherwise noted, a complete review of systems is negative.  Physical Exam General: NAD, morbidly obese Cardiovascular: Tachycardic  Pulmonary: diminished bilaterally  Abdomen: soft, nontender, + bowel sounds Extremities: bilateral lowe extremity edema w/leg wraps, no joint deformities Skin: no rashes, warm  and dry Neurological: AAO x3, mood appropriate, somewhat child like behaviors/responses  Vital Signs:  BP (!) 148/105 (BP Location: Left Wrist)   Pulse (!) 123   Temp 98.5 F (36.9 C) (Oral)   Resp (!) 22   Ht _0  (1.88 m)   Wt (!) 182.4 kg   SpO2 98%   BMI 51.64 kg/m  Pain Scale: 0-10   Pain Score: 1   SpO2: SpO2: 98 % O2 Device:SpO2: 98 % O2 Flow Rate: .   IO: Intake/output summary:  Intake/Output Summary (Last 24 hours) at 10/31/2020 1446 Last data filed at 10/31/2020 1013 Gross per 24 hour  Intake 3245.61 ml  Output 5875 ml  Net -2629.39 ml    LBM: Last BM Date: 10/29/20 Baseline Weight: Weight: (!) 186 kg Most recent weight: Weight: (!) 182.4 kg      Palliative Assessment/Data: PPS 40%   Advanced Care Planning:   Primary Decision Maker: NEXT OF KIN  Code Status/Advance Care Planning: Full code  A discussion was had  today regarding advanced directives. Concepts specific to code status, artifical feeding and hydration, continued IV antibiotics and rehospitalization was had.    Patient states he does not have an advanced directive however his wife would be his medical decision maker with support of his siblings. Encouraged completion of documents.   I discussed at length patient's current full code status with consideration to his current illness and co-morbidities. Recommendations for DNR/DNI provided in the setting patient acknowledges his terminal condition and poor prognosis. Patient however is requesting to remain a full code and engage in further discussions with his wife. Wife states she is not prepared for him to answer this question or make a decision on DNR/DNI. She is requesting more time. Extensive education provided on what a code situation would look like for patient and minimal to no chances of survival or meaningful recovery in the setting of end-stage heart failure. Shared with patient DNR would support his expressed understanding and statements  that he knows he is and will die in the near future and he is ok with this.    The difference between a aggressive medical intervention path and a palliative comfort care path was discussed.   Patient and wife are clear in expressed goals to continue to treat the treatable. They have verbalized their understanding of his poor prognosis with no additional interventional options. Patient and family requesting time to engage in further family discussions as they navigate complex decision making.  Hospice and Palliative Care services outpatient were explained and offered. Patient and family verbalized their understanding and awareness of both palliative and hospice's goals and philosophy of care. Wife shares her concerns stating she was only told about Palliative no one had mention hospice or that he was at the point where discussions were needed. Further education provided. Patient and family verbalized understanding.   Assessment & Plan:   SUMMARY OF RECOMMENDATIONS   Full Code-as confirmed by patient. He wishes to engage in ongoing family discussions regarding DNR/DNI despite recommendations and his ability to verbalize and express he knows that he is at end-stage disease with poor prognosis.  Extensive education on code status and outpatient hospice support. Patient and family requesting to continue to treat the treatable as they continue with ongoing discussions. Patient's siblings much more receptive to discussions also encouraging DNR/DNI and home support. Wife requesting additional time for discussions.  PMT will continue to support and follow. Please call team line with urgent needs.   Palliative Prophylaxis:  Frequent Pain Assessment  Additional Recommendations (Limitations, Scope, Preferences): Full Scope Treatment  Psycho-social/Spiritual:  Desire for further Chaplaincy support: no Additional Recommendations: Education on Hospice and Ongoing goals of care discussions   Prognosis:   POOR   Discharge Planning:  To Be Determined   Patient and his family (wife, brother, sister) expressed understanding and was in agreement with this plan.   Time In: 1230 Time Out: 1335 Time Total: 65 min.   Visit consisted of counseling and education dealing with the complex and emotionally intense issues of symptom management and palliative care in the setting of serious and potentially life-threatening illness.Greater than 50%  of this time was spent counseling and coordinating care related to the above assessment and plan.  Signed by:  Alda Lea, AGPCNP-BC Palliative Medicine Team  Phone: [phone] Pager: [phone] Amion: Bjorn Pippin   Thank you for allowing the Palliative Medicine Team to assist in the care of this patient. Please utilize secure chat with additional questions, if there is no response  within 30 minutes please call the above phone number. Palliative Medicine Team providers are available by phone from 7am to 5pm daily and can be reached through the team cell phone.  Should this patient require assistance outside of these hours, please call the patient's attending physician.

## 2020-10-31 NOTE — Progress Notes (Addendum)
ANTICOAGULATION CONSULT NOTE  Pharmacy Consult for heparin >> bivalirudin Indication: atrial fibrillation  Allergies  Allergen Reactions   Hydrocodone     Hives     Patient Measurements: Height: 6\' 2"  (188 cm) Weight: (!) 182.4 kg (402 lb 3.2 oz) IBW/kg (Calculated) : 82.2 Heparin Dosing Weight:  127.7kg  Vital Signs: Temp: 98.8 F (37.1 C) (09/16 2106) Temp Source: Oral (09/16 2106) BP: 124/80 (09/16 2106) Pulse Rate: 117 (09/16 2106)  Labs: Recent Labs    10/29/20 0611 10/29/20 1804 10/30/20 0500 10/30/20 1600 10/31/20 0530 10/31/20 0538 10/31/20 1300  HGB 15.0  --  15.0  --  14.6  --   --   HCT 44.6  --  45.3  --  44.7  --   --   PLT 218  --  237  --  228  --   --   HEPARINUNFRC  --    < > 0.36 0.38  --  0.33  --   CREATININE 1.79*   < > 1.72*  --  1.73*  --  1.48*   < > = values in this interval not displayed.     Estimated Creatinine Clearance: 120.5 mL/min (A) (by C-G formula based on SCr of 1.48 mg/dL (H)).   Assessment: 35 yo M with hx of HFrEF and new atrial flutter. Pharmacy consulted for IV heparin> bivalirudin.   Baseline platelet count is 261 - platelets today 228. Found to have rash on LL abdomen with central dark umbilication. Consulted to switch to bivalirudin for concern of HIT - 4Ts score 2, low probability <5% (points for possible skin necrosis); however in the absence of thrombocytopenia. Will use bivalirudin over argatroban in case any future cardiac procedures needed. No s/sx of bleeding.    Goal of Therapy:  aPTT 50-85 seconds Monitor platelets by anticoagulation protocol: Yes   Plan:  Stop heparin infusion Start bivalirudin at 0.15 mg/kg/hr  Order aPTT in 4 hours after start Monitor daily aPTT, CBC, and for s/sx of bleeding   Thank you for allowing pharmacy to participate in this patient's care.  31, PharmD, BCCCP Clinical Pharmacist  Phone: [phone] 10/31/2020 9:17 PM  Please check AMION for all [facility] phone  numbers After 10:00 PM, call Main Pharmacy [phone]

## 2020-10-31 NOTE — Progress Notes (Addendum)
Advanced Heart Failure Rounding Note  PCP-Cardiologist: Armanda Magic, MD  HF MD: Dr. Gala Romney   Patient Profile   [name] is a 35 year old male with history of HFrEF, NICM, HTN, asthma, OSA, morbid obesity, and uncontrolled DM. Hx of medical noncompliance. Admitted on 09/13 with acute on chronic systolic HF, severe hyperglycemia, AKI and abdominal pain.    Subjective:    09/13: Started on Empiric milrinone 0.25, lasix gtt at 10/hr 09/14: Developed AFL w/ RVR>>amio gtt started   CO-OX 57% on 0.25 milrinone  CVP 19. On lasix gtt at 20/hr. Received 2.5 mg metolazone 09/14 and 09/15  Feels very fatigued. Dyspnea with activity but comfortable at rest.   Neg 2.6L last 24 hrs. Weight down another 6 lbs.  Scr stable at 1.73. K 2.8. Mag 2.1  Sinus tach this am, rates 110s-120s    Cardiac Studies   Echo 09/22: EF < 20%, RV fxn moderately reduced, trivial MR, mild TR Echo 10/21: EF 30-35% with moderate RV dysfunction in setting of recurrent RBBB with significant dyssynchrony.  ECHO 04/2019: EF 30-35%  ECHO 2019: EF 55%   Echo EF 15-20% in [place] in 2017. Cath around that time without any significant CAD per patient.  Objective:   Weight Range: (!) 182.4 kg Body mass index is 51.64 kg/m.   Vital Signs:   Temp:  [97.9 F (36.6 C)-98.6 F (37 C)] 98.6 F (37 C) (09/16 0743) Pulse Rate:  [108-130] 114 (09/16 0743) Resp:  [16-28] 20 (09/16 0743) BP: (124-158)/(99-140) 124/106 (09/16 0743) SpO2:  [91 %-97 %] 96 % (09/16 0743) Weight:  [182.4 kg] 182.4 kg (09/16 0436) Last BM Date: 10/29/20  Weight change: Filed Weights   10/29/20 0405 10/30/20 0552 10/31/20 0436  Weight: (!) 186.6 kg (!) 185.2 kg (!) 182.4 kg    Intake/Output:   Intake/Output Summary (Last 24 hours) at 10/31/2020 1040 Last data filed at 10/31/2020 1013 Gross per 24 hour  Intake 3245.61 ml  Output 6475 ml  Net -3229.39 ml      Physical Exam   CVP 19 General:  Morbidly obese [name] sitting on  side of bed. No distress. HEENT: normal Neck: supple. JVD to jaw Carotids 2+ bilat; no bruits. No lymphadenopathy or thryomegaly appreciated. Cor: PMI nondisplaced. Regular rhythm, tachy. No rubs, gallops or murmurs. Lungs: diminished Abdomen: obese, non-tender, edematous and distended. No hepatosplenomegaly.  Extremities: tenderness at RUE PICC with mild swelling, no cyanosis, clubbing, rash, 3+ BLE edema, Unna boots on Neuro: alert & orientedx3, cranial nerves grossly intact. moves all 4 extremities w/o difficulty. Affect pleasant    Telemetry   Sinus tach 110s-120  EKG    No new EKG to review   Labs    CBC Recent Labs    10/30/20 0500 10/31/20 0530  WBC 8.1 7.0  HGB 15.0 14.6  HCT 45.3 44.7  MCV 91.3 90.9  PLT 237 228   Basic Metabolic Panel Recent Labs    19/14/78 2350 10/29/20 0611 10/30/20 0500 10/31/20 0530  NA  --    < > 126* 130*  K  --    < > 3.2* 2.8*  CL  --    < > 84* 83*  CO2  --    < > 25 31  GLUCOSE  --    < > 399* 246*  BUN  --    < > 26* 28*  CREATININE  --    < > 1.72* 1.73*  CALCIUM  --    < >  8.4* 9.5  MG 2.0   < > 1.8 2.1  PHOS 5.1*  --   --   --    < > = values in this interval not displayed.   Liver Function Tests Recent Labs    10/29/20 0611  AST 30  ALT 26  ALKPHOS 91  BILITOT 1.8*  PROT 6.5  ALBUMIN 2.8*   No results for input(s): LIPASE, AMYLASE in the last 72 hours. Cardiac Enzymes No results for input(s): CKTOTAL, CKMB, CKMBINDEX, TROPONINI in the last 72 hours.  BNP: BNP (last 3 results) Recent Labs    05/01/20 1020 09/11/20 0951 10/28/20 1315  BNP 862.1* 803.3* 1,547.7*    ProBNP (last 3 results) No results for input(s): PROBNP in the last 8760 hours.   D-Dimer No results for input(s): DDIMER in the last 72 hours. Hemoglobin A1C No results for input(s): HGBA1C in the last 72 hours. Fasting Lipid Panel No results for input(s): CHOL, HDL, LDLCALC, TRIG, CHOLHDL, LDLDIRECT in the last 72 hours. Thyroid  Function Tests No results for input(s): TSH, T4TOTAL, T3FREE, THYROIDAB in the last 72 hours.  Invalid input(s): FREET3  Other results:   Imaging    No results found.   Medications:     Scheduled Medications:  Chlorhexidine Gluconate Cloth  6 each Topical Daily   digoxin  0.125 mg Oral Daily   insulin aspart  0-15 Units Subcutaneous TID WC   insulin aspart  5 Units Subcutaneous TID WC   insulin glargine-yfgn  20 Units Subcutaneous Daily   loratadine  10 mg Oral Daily   melatonin  3 mg Oral QHS   metolazone  2.5 mg Oral Once   potassium chloride  40 mEq Oral BID   sodium chloride flush  10-40 mL Intracatheter Q12H   sodium chloride flush  3 mL Intravenous Q12H   spironolactone  12.5 mg Oral Daily    Infusions:  amiodarone 30 mg/hr (10/31/20 0656)   furosemide (LASIX) 200 mg in dextrose 5% 100 mL (2mg /mL) infusion 20 mg/hr (10/31/20 0625)   heparin 2,250 Units/hr (10/31/20 1036)   milrinone 0.25 mcg/kg/min (10/31/20 0624)   sodium chloride      PRN Medications: acetaminophen **OR** acetaminophen, alum & mag hydroxide-simeth, sodium chloride flush  Assessment/Plan   A/C HFrEF, NICM  -Most recent ECHO back in 2021 EF 30-35 % with moderate RV dysfunction in the setting of recurrent RBBB with significant dyssynchrony. Echo this admit with EF < 20% and moderate RV dysfunction - Marked volume overload w/ low output. On 0.25 milrinone. Co-ox 57% today  - CVP 19. Continue lasix gtt at 20/hr. Give another 2.5 mg metolazone this afternoon once K supplemented. Scr stable. K 2.8. Mag okay. - Continue dig 0.125 mg daily.  Dig level ok. - No bb with low output.  - Intolerant SGLT2i due to candidiasis +uncontrolled DM - Add back spiro at 12.5 mg daily - Hopefully can add back entresto in next couple of days - Not a candidate for advanced therapies with Hgb A1C >14 and noncompliance. Milrinone only a short-term solution. Prognosis tenuous.    2. Uncontrolled DM -9/1 Hgb A1C >  14.  -Per primary team.    3. AKI -Baseline creatinine < 1.  - Creatinine 1.6> 1.79>1.72>1.73.   - 2/2 low output heart failure/ cardiorenal .  - continue diuresis + inotrope supp - follow BMP    4. Atrial Flutter  - c/w amio gtt while on milrinone - rhythm this am appears to be sinus  tach? - Heparin gtt - Supp K and Mg    5. Abdominal Pain  -CT with sludge in gall bladder.  -? Low output heart failure    6. Hypervolemic Hyponatremia  - Na 125 >127>126>130 - Restrict free water.  - Diurese   7. OSA Severe OSA AHI 65 -  Bipap QHS   8. Morbid Obesity  Body mass index is 51.64 kg/m.  9. H/O medical noncompliance -Reports taking HF medications prior to admit. Missed last few clinic f/u and discharged from HF paramedicine d/t noncompliance.  -Consider HF paramedicine at discharge if willing  10. Hypokalemia/ Hypomagnesemia - K 2.8, Mg better at 2.1 - 40 K TID today. - Recheck this afternoon  11. HTN - Blood pressure elevated - Adding spiro as above  12. RUE pain - At site of PICC line. Tender with palpation. Mild swelling - Will get venous duplex  Length of Stay: 3  Jonathan [name], Jonathan N, PA-C  10/31/2020, 10:40 AM  Advanced Heart Failure Team Pager [phone] (M-F; 7a - 5p)  Please contact [facility] for night-coverage after hours (5p -7a ) and weekends on [email or web address]    Patient seen and examined with the above-signed Advanced Practice Provider and/or Housestaff. I personally reviewed laboratory data, imaging studies and relevant notes. I independently examined the patient and formulated the important aspects of the plan. I have edited the note to reflect any of my changes or salient points. I have personally discussed the plan with the patient and/or family.  He remains on IV lasix at 20 and milrinone. Co-ox 57%. Diuresing well. Weight down 6 pounds. Back in NSR on IV amio. Breathing better. Denies orthopnea or PND. RUE sore around PICC. K 2.8. Sodium 130. CVP  19  General:  Sitting up on side of bed. No resp difficulty HEENT: normal Neck: supple. CVP to ear.. Carotids 2+ bilat; no bruits. No lymphadenopathy or thryomegaly appreciated. Cor: PMI nondisplaced. Regular tachy No rubs, gallops or murmurs. Lungs: clear Abdomen: markedly obese soft, nontender, + distended. No hepatosplenomegaly. No bruits or masses. Good bowel sounds. Extremities: no cyanosis, clubbing, rash, 3-4+ woody edema into thighs Neuro: alert & orientedx3, cranial nerves grossly intact. moves all 4 extremities w/o difficulty. Affect pleasant  He remains markedly volume overloaded. Co-ox marginal but improved with milrinone. Continues to diurese well weight up > 30+ pounds. Need to diurese fully prior to weaning inotropes. Long talk with him and family that he would not be candidate for VAD or transplant currently due to size and Hgba1c > 14    Jonathan Meres, MD  2:52 PM

## 2020-10-31 NOTE — Progress Notes (Addendum)
Paged by RN this afternoon regarding left lower quadrant pain and tenderness. Patient evaluated at bedside. He reports having a new rash on his left lower abdomen over the past several hours that is exquisitely tender. On examination, there is a central dark umbilication with surrounding erythema that is tender to light touch. No warmth or induration noted. (See picture below).  Unclear etiology as patient reports that he has not had any injections in his abdomen.     Plan:  Warm compresses to the area Continue to monitor - if develops worsening symptoms or febrile, will consider addition of antibiotics vs imaging Also concern for possible skin necrosis given purpuric appearance, tenderness; he is on IV heparin gtt.

## 2020-10-31 NOTE — Progress Notes (Signed)
HD#3 SUBJECTIVE:  Patient Summary: Jonathan [name] is a 35 y.o. with a pertinent PMH of HFrEF, uncontrolled insulin-naive type 2 diabetes, hypertension, OSA, who presented with shortness of breath and abdominal pain and admitted for heart failure exacerbation complicated by uncontrolled type 2 diabetes mellitus.   Overnight Events: None  Interim History: Patient evaluated at bedside.  His wife and 2 other people, perhaps siblings, are present.  He states that his breathing has not changed and he does not have chest pain, palpitations, abdominal pain.  He endorses continued urination.  I have encouraged him to work with physical therapy whenever they come to his room to maintain his strength, and counseled him on some bed bound exercises that he can do.  OBJECTIVE:  Vital Signs: Vitals:   10/31/20 0743 10/31/20 1222 10/31/20 1551 10/31/20 1621  BP: (!) 124/106 (!) 148/105  130/81  Pulse: (!) 114 (!) 123  (!) 123  Resp: 20 (!) 22 16 19   Temp: 98.6 F (37 C) 98.5 F (36.9 C)  98 F (36.7 C)  TempSrc: Oral Oral  Oral  SpO2: 96% 98%  95%  Weight:      Height:       Supplemental O2: Room Air SpO2: 95 %  Filed Weights   10/29/20 0405 10/30/20 0552 10/31/20 0436  Weight: (!) 186.6 kg (!) 185.2 kg (!) 182.4 kg     Intake/Output Summary (Last 24 hours) at 10/31/2020 1751 Last data filed at 10/31/2020 1500 Gross per 24 hour  Intake 1280 ml  Output 5875 ml  Net -4595 ml   Net IO Since Admission: -7,201.01 mL [10/31/20 1751]  Physical Exam: Constitutional: Morbidly obese gentleman sitting on the side of his bed.  No acute distress noted. Cardio: Tachycardia with regular rhythm.  No murmurs noted. Pulm: Clear to auscultation bilaterally.  Normal effort of breathing. Abdomen: Abdomen is soft, nontender.  There is 1-2+ pitting edema to the level of the umbilicus. MSK: Lower extremity edema 2+ bilaterally. Skin: Skin is cool to the touch.  Since morning rounds, patient has developed a  exquisitely tender, circular, black lesion in the area of his right lower quadrant.  The area is surrounded by erythema. Neuro: Alert and oriented x3.  No focal deficit noted. Psych: Appropriate mood and affect.    Patient Lines/Drains/Airways Status     Active Line/Drains/Airways     Name Placement date Placement time Site Days   Peripheral IV 10/28/20 20 G Right Antecubital 10/28/20  0746  Antecubital  3   Peripheral IV 10/31/20 22 G 1.75" Left Forearm 10/31/20  1712  Forearm  less than 1   PICC Double Lumen 10/28/20 PICC Right Cephalic 39 cm 0 cm 10/28/20  10/30/20  -- 3             ASSESSMENT/PLAN:  Assessment: Active Problems:   Diabetes mellitus type 2 in obese (HCC)   Essential hypertension   OSA (obstructive sleep apnea)   Acute on chronic heart failure (HCC)   Goals of care, counseling/discussion   Plan: #Acute on chronic biventricular, low output failure Patient continues to have evidence of acute exacerbation of biventricular, low output heart failure with 2+ pitting edema to the umbilicus a persistence of extremities that are cool to the touch and co-ox testing supporting poor perfusion: Total hemoglobin 15.2, carboxyhemoglobin 1.2, methemoglobin 0.6, O2 saturation 57.  These are minimally changed from yesterday.  AKI is persistent with slight worsening of BUN/CR of 28/1.73 compared to 26/1.72 yesterday. -Advanced heart  failure team consulted, appreciate their recommendations             -Continue Lasix 20 mg/h             -Continue milrinone 13.95 mL/h             -Continue digoxin 0.125 mg daily             -Continue amiodarone 30 mg/h  -Initiate spironolactone 12.5 mg daily             -Additional dose of metolazone 2.5 mg             -Avoid beta-blockers due to low output             -Continue IV heparin with daily heparin levels and CBC trending             -K goal greater than 4, Mg goal greater than 2                         -Give an extra dose of K this  morning as level was 3.2.             -Consider adding back Entresto this admission -Trend BMP -CVP monitoring -Palliative care consulted, appreciate their assistance  -Patient wishes to engage in ongoing family discussions regarding DNR/DNI status.  He does verbalize understanding that he is at end-stage disease with poor prognosis.   #Uncontrolled type 2 diabetes mellitus Patient continues to have persistently elevated blood sugars during admission.  He was previously on Lantus 20units with moderate SSI at mealtime.  Lantus was increased to 25 units today. -Continue scheduled insulin: 5 units 3 times daily in addition to sliding scale insulin at mealtime. -Increased  Lantus to 25 units -CBG monitoring   #Cardiorenal syndrome Patient with AKI likely due to to low output heart failure.  BUN/creatinine of 2 28/1.73 it seems that his baseline creatinine is 0.93. Urine output during this admission has been roughly 3.349 L. -Nephrology consulted, appreciate their recommendations -Trend BMP -Avoid nephrotoxic agents -Strict I's/O's -Continue IV Lasix and milrinone as above.   #HTN #HLD Historically treated for hypertension with carvedilol 25 mg twice daily, Entresto 97-103 mg twice daily, spironolactone 25 mg daily. -Spironolactone 12.5 mg initiated today -Consider restarting Entresto during this admission   #OSA -BiPAP   #Hypervolemic hyponatremia Hyponatremia persists though is improving with morning labs showing Na of 130.  This is likely secondary to volume overload from acute on chronic heart failure exacerbation.  We will continue to diurese. -Trend BMP -Restrict free water  Best Practice: Diet: Cardiac diet IVF: Fluids: None VTE:   Heparin Code: Full AB: None DISPO: Continue to monitor inpatient.  Signature: Champ Mungo, D.O.  Internal Medicine Resident, PGY-1 [facility]  Pager: [phone] 5:51 PM, 10/31/2020   Please contact the on  call pager after 5 pm and on weekends at [phone].

## 2020-10-31 NOTE — Progress Notes (Signed)
Inpatient Diabetes Program Recommendations  AACE/ADA: New Consensus Statement on Inpatient Glycemic Control (2015)  Target Ranges:  Prepandial:   less than 140 mg/dL      Peak postprandial:   less than 180 mg/dL (1-2 hours)      Critically ill patients:  140 - 180 mg/dL   Lab Results  Component Value Date   GLUCAP 252 (H) 10/31/2020   HGBA1C >14.0 (A) 10/16/2020    Review of Glycemic Control Results for WAYMAN, HOARD (MRN [id number]) as of 10/31/2020 12:20  Ref. Range 10/30/2020 16:41 10/30/2020 20:43 10/31/2020 05:49  Glucose-Capillary Latest Ref Range: 70 - 99 mg/dL 176 (H) 160 (H) 737 (H)  Diabetes history: DM 2 Outpatient Diabetes medications:  Trulicity 1.5 mg weekly Current orders for Inpatient glycemic control:  Novolog moderate tid with meals Semglee 25 units daily, Novolog 7 units tid with meals  Inpatient Diabetes Program Recommendations:    Received message from social worker that patient is interested in CGM at d/c for glucose checks.  Will need Rx. For CGM at d/c along with pre-authorization form. Will follow.   Thanks,  Beryl Meager, RN, BC-ADM Inpatient Diabetes Coordinator Pager [phone]  (8a-5p)

## 2020-10-31 NOTE — Progress Notes (Signed)
Lower extremity venous has been completed.   Preliminary results in CV Proc.   Aundra Millet Morganne Haile 10/31/2020 3:37 PM

## 2020-10-31 NOTE — Progress Notes (Signed)
Went to bedside to reassess patient's new skin finding from earlier today. Patient said that his wife checked him over completely and the only skin lesion he had was that one spot on his left lower abdomen. He said that he hasn't moved around a lot today or tried to touch the area but doesn't think it is any worse than it was earlier today. He denies any generalized abdominal pain but says that it is tender in that specific area. He has never had a skin reaction like this before. On exam there is an oval shaped area of erythema and violaceous discoloration with induration with a central purpuric lesion. The purpuric lesion appears to be the same in size when compared to the photo from 1700. The area is exquisitely tender to light touch. The overall appearance raises concern for a necrotic/vasculitis process. The patient is on IV heparin which is less likely to cause skin necrosis than heparin injections. Patient's last CBC showed no signs of thrombocytopenia which would suggest HIT, however rare occurrences of heparin induced skin necrosis have been reported at sites other than an injection site, more commonly present in fat rich places such as the abdomen. Spoke with pharmacy about patient's other anticoagulation options, unsure whether patient will be going for any procedures in the near future. Will d/c heparin and switch to argatroban per pharmacy out of caution for some sort of heparin induced adverse reaction. Can consider imaging studies of the abdomen tomorrow though options are limited due to patient's hepatorenal syndrome. Plan to follow up platelet count on morning CBC.

## 2020-10-31 NOTE — Progress Notes (Signed)
ANTICOAGULATION CONSULT NOTE - Follow Up Consult  Pharmacy Consult for Heparin Indication: atrial fibrillation  Allergies  Allergen Reactions   Hydrocodone     Hives     Patient Measurements: Height: 6\' 2"  (188 cm) Weight: (!) 182.4 kg (402 lb 3.2 oz) IBW/kg (Calculated) : 82.2 Heparin Dosing Weight:  127.7kg  Vital Signs: Temp: 98.5 F (36.9 C) (09/16 0440) Temp Source: Oral (09/16 0440) BP: 151/99 (09/16 0440) Pulse Rate: 113 (09/16 0440)  Labs: Recent Labs    10/29/20 0611 10/29/20 0611 10/29/20 1804 10/30/20 0500 10/30/20 1600 10/31/20 0530 10/31/20 0538  HGB 15.0  --   --  15.0  --  14.6  --   HCT 44.6  --   --  45.3  --  44.7  --   PLT 218  --   --  237  --  228  --   HEPARINUNFRC  --    < > <0.10* 0.36 0.38  --  0.33  CREATININE 1.79*  --  1.76* 1.72*  --  1.73*  --    < > = values in this interval not displayed.     Estimated Creatinine Clearance: 103.1 mL/min (A) (by C-G formula based on SCr of 1.73 mg/dL (H)).   Assessment: 35 yo M with hx of HFrEF and new a. Flutter. Pharmacy consulted for IV heparin.   Hgb and plt stable. No s/s of bleeding.  Heparin level 0.33, therapeutic   Goal of Therapy:  Heparin level 0.3-0.7 units/ml Monitor platelets by anticoagulation protocol: Yes   Plan:  Continue IV heparin 2250 units/hr Daily HL and CBC  Thank you for allowing pharmacy to participate in this patient's care.  31, PharmD PGY1 Acute Care Resident  10/31/2020,6:45 AM

## 2020-10-31 NOTE — Progress Notes (Signed)
Patient has home CPAP, however, stated he wont be using it tonight.

## 2020-10-31 NOTE — TOC Progression Note (Signed)
Transition of Care [facility]) - Progression Note    Patient Details  Name: Jonathan [name] MRN: [id number] Date of Birth: [birth year]  [facility] CM/SW Contact  Leone Haven, RN Phone Number: 10/31/2020, 12:23 PM  Clinical Narrative:    Per CSW , Cortlin, patient is interested in a CGM, Cortlin reached out to diabetes coordinator and the coordinator ,Victorino Dike , states  will put in note to f/u with patient at d/c and place sensor on discharge- Medicaid does required a pre-authorization form to to be done to get CGM covered.        Expected Discharge Plan and Services                                                 Social Determinants of Health (SDOH) Interventions Food Insecurity Interventions: Intervention Not Indicated Financial Strain Interventions: Intervention Not Indicated Housing Interventions: Intervention Not Indicated Transportation Interventions: Intervention Not Indicated  Readmission Risk Interventions No flowsheet data found.

## 2020-11-01 DIAGNOSIS — I5023 Acute on chronic systolic (congestive) heart failure: Secondary | ICD-10-CM | POA: Diagnosis not present

## 2020-11-01 DIAGNOSIS — G4733 Obstructive sleep apnea (adult) (pediatric): Secondary | ICD-10-CM | POA: Diagnosis not present

## 2020-11-01 DIAGNOSIS — E669 Obesity, unspecified: Secondary | ICD-10-CM | POA: Diagnosis not present

## 2020-11-01 DIAGNOSIS — Z7189 Other specified counseling: Secondary | ICD-10-CM | POA: Diagnosis not present

## 2020-11-01 DIAGNOSIS — E1169 Type 2 diabetes mellitus with other specified complication: Secondary | ICD-10-CM | POA: Diagnosis not present

## 2020-11-01 DIAGNOSIS — I509 Heart failure, unspecified: Secondary | ICD-10-CM | POA: Diagnosis not present

## 2020-11-01 DIAGNOSIS — I10 Essential (primary) hypertension: Secondary | ICD-10-CM | POA: Diagnosis not present

## 2020-11-01 DIAGNOSIS — Z515 Encounter for palliative care: Secondary | ICD-10-CM | POA: Diagnosis not present

## 2020-11-01 LAB — BASIC METABOLIC PANEL
Anion gap: 13 (ref 5–15)
BUN: 26 mg/dL — ABNORMAL HIGH (ref 6–20)
CO2: 31 mmol/L (ref 22–32)
Calcium: 9 mg/dL (ref 8.9–10.3)
Chloride: 85 mmol/L — ABNORMAL LOW (ref 98–111)
Creatinine, Ser: 1.81 mg/dL — ABNORMAL HIGH (ref 0.61–1.24)
GFR, Estimated: 49 mL/min — ABNORMAL LOW (ref >60)
Glucose, Bld: 277 mg/dL — ABNORMAL HIGH (ref 70–99)
Potassium: 3.1 mmol/L — ABNORMAL LOW (ref 3.5–5.1)
Sodium: 129 mmol/L — ABNORMAL LOW (ref 135–145)

## 2020-11-01 LAB — CBC
HCT: 42.4 % (ref 39.0–52.0)
Hemoglobin: 14.4 g/dL (ref 13.0–17.0)
MCH: 30.5 pg (ref 26.0–34.0)
MCHC: 34 g/dL (ref 30.0–36.0)
MCV: 89.8 fL (ref 80.0–100.0)
Platelets: 231 10*3/uL (ref 150–400)
RBC: 4.72 MIL/uL (ref 4.22–5.81)
RDW: 14 % (ref 11.5–15.5)
WBC: 6.6 10*3/uL (ref 4.0–10.5)
nRBC: 0 % (ref 0.0–0.2)

## 2020-11-01 LAB — GLUCOSE, CAPILLARY
Glucose-Capillary: 270 mg/dL — ABNORMAL HIGH (ref 70–99)
Glucose-Capillary: 229 mg/dL — ABNORMAL HIGH (ref 70–99)
Glucose-Capillary: 231 mg/dL — ABNORMAL HIGH (ref 70–99)
Glucose-Capillary: 167 mg/dL — ABNORMAL HIGH (ref 70–99)

## 2020-11-01 LAB — COOXEMETRY PANEL
Carboxyhemoglobin: 1.7 % — ABNORMAL HIGH (ref 0.5–1.5)
Methemoglobin: 0.8 % (ref 0.0–1.5)
O2 Saturation: 58.9 %
Total hemoglobin: 14.5 g/dL (ref 12.0–16.0)

## 2020-11-01 LAB — APTT
aPTT: 65 seconds — ABNORMAL HIGH (ref 24–36)
aPTT: 67 seconds — ABNORMAL HIGH (ref 24–36)

## 2020-11-01 LAB — HEPARIN LEVEL (UNFRACTIONATED): Heparin Unfractionated: 0.44 IU/mL (ref 0.30–0.70)

## 2020-11-01 LAB — MAGNESIUM: Magnesium: 1.8 mg/dL (ref 1.7–2.4)

## 2020-11-01 MED ORDER — HEPARIN (PORCINE) 25000 UT/250ML-% IV SOLN
2250.0000 [IU]/h | INTRAVENOUS | Status: DC
  Administered 2020-11-01 – 2020-11-06 (×11): 2250 [IU]/h via INTRAVENOUS
  Filled 2020-11-01 (×11): qty 250

## 2020-11-01 MED ORDER — POTASSIUM CHLORIDE CRYS ER 20 MEQ PO TBCR
40.0000 meq | EXTENDED_RELEASE_TABLET | Freq: Three times a day (TID) | ORAL | Status: AC
  Administered 2020-11-01 (×3): 40 meq via ORAL
  Filled 2020-11-01 (×3): qty 2

## 2020-11-01 MED ORDER — INSULIN ASPART 100 UNIT/ML IJ SOLN
10.0000 [IU] | Freq: Three times a day (TID) | INTRAMUSCULAR | Status: DC
  Administered 2020-11-01 – 2020-11-02 (×4): 10 [IU] via SUBCUTANEOUS

## 2020-11-01 MED ORDER — MAGNESIUM SULFATE 2 GM/50ML IV SOLN
2.0000 g | Freq: Once | INTRAVENOUS | Status: AC
  Administered 2020-11-01: 2 g via INTRAVENOUS
  Filled 2020-11-01: qty 50

## 2020-11-01 MED ORDER — SPIRONOLACTONE 25 MG PO TABS
25.0000 mg | ORAL_TABLET | Freq: Every day | ORAL | Status: DC
  Administered 2020-11-01 – 2020-11-12 (×12): 25 mg via ORAL
  Filled 2020-11-01 (×12): qty 1

## 2020-11-01 MED ORDER — POTASSIUM CHLORIDE 10 MEQ/50ML IV SOLN
10.0000 meq | INTRAVENOUS | Status: AC
  Administered 2020-11-01 (×4): 10 meq via INTRAVENOUS
  Filled 2020-11-01 (×4): qty 50

## 2020-11-01 MED ORDER — INSULIN GLARGINE-YFGN 100 UNIT/ML ~~LOC~~ SOLN
30.0000 [IU] | Freq: Every day | SUBCUTANEOUS | Status: DC
  Administered 2020-11-02: 30 [IU] via SUBCUTANEOUS
  Filled 2020-11-01 (×2): qty 0.3

## 2020-11-01 MED ORDER — ACETAZOLAMIDE 250 MG PO TABS
250.0000 mg | ORAL_TABLET | Freq: Two times a day (BID) | ORAL | Status: DC
  Administered 2020-11-01 – 2020-11-05 (×10): 250 mg via ORAL
  Filled 2020-11-01 (×10): qty 1

## 2020-11-01 NOTE — Progress Notes (Signed)
ANTICOAGULATION CONSULT NOTE - Follow Up Consult  Pharmacy Consult for bivalirudin Indication: atrial fibrillation  Labs: Recent Labs    10/30/20 0500 10/30/20 1600 10/31/20 0530 10/31/20 0538 10/31/20 1300 11/01/20 0300  HGB 15.0  --  14.6  --   --  14.4  HCT 45.3  --  44.7  --   --  42.4  PLT 237  --  228  --   --  231  APTT  --   --   --   --   --  65*  HEPARINUNFRC 0.36 0.38  --  0.33  --   --   CREATININE 1.72*  --  1.73*  --  1.48*  --     Assessment/Plan:  35yo male therapeutic on bivalirudin after transition from heparin. Will continue infusion at current rate of 0.15 mg/kg/hr units/hr and confirm stable with additional PTT.   Vernard Gambles, PharmD, BCPS  11/01/2020,3:37 AM

## 2020-11-01 NOTE — Progress Notes (Addendum)
Advanced Heart Failure Rounding Note  PCP-Cardiologist: Armanda Magic, MD  HF MD: Dr. Gala Romney   Patient Profile   Jonathan [name] is a 35 year old male with history of HFrEF, NICM, HTN, asthma, OSA, morbid obesity, and uncontrolled DM. Hx of medical noncompliance. Admitted on 09/13 with acute on chronic systolic HF, severe hyperglycemia, AKI and abdominal pain.    Subjective:    09/13: Started on Empiric milrinone 0.25, lasix gtt at 10/hr 09/14: Developed AFL w/ RVR>>amio gtt started   On 0.25 milrinone. Co-ox 59%  Remains on lasix gtt at 20/hr and metolazone. Weight only down 1 pound. CVP 22  Switched to bival overnight due to concern over HIT despite PLTs 228k. Has pinpoint rash on lower abdomen that is tender to touch.    Cardiac Studies   Echo 09/22: EF < 20%, RV fxn moderately reduced, trivial MR, mild TR Echo 10/21: EF 30-35% with moderate RV dysfunction in setting of recurrent RBBB with significant dyssynchrony.  ECHO 04/2019: EF 30-35%  ECHO 2019: EF 55%   Echo EF 15-20% in [place] in 2017. Cath around that time without any significant CAD per patient.  Objective:   Weight Range: (!) 181.2 kg Body mass index is 51.29 kg/m.   Vital Signs:   Temp:  [98 F (36.7 C)-98.8 F (37.1 C)] 98.6 F (37 C) (09/17 0405) Pulse Rate:  [108-123] 108 (09/17 0405) Resp:  [13-22] 13 (09/17 0405) BP: (108-148)/(80-105) 108/82 (09/17 0405) SpO2:  [95 %-98 %] 97 % (09/17 0405) Weight:  [181.2 kg] 181.2 kg (09/17 0500) Last BM Date: 10/29/20  Weight change: Filed Weights   10/31/20 0436 11/01/20 0050 11/01/20 0500  Weight: (!) 182.4 kg (!) 181.2 kg (!) 181.2 kg    Intake/Output:   Intake/Output Summary (Last 24 hours) at 11/01/2020 0913 Last data filed at 11/01/2020 0853 Gross per 24 hour  Intake 2770.51 ml  Output 3500 ml  Net -729.49 ml       Physical Exam   CVP 22 General:  Morbidly obese [name] sitting on side of bed. No distress. HEENT: normal Neck: supple. JVP to  ear  Cor: PMI nondisplaced. Regular tachy Lungs: clear Abdomen: morbidly obese +distended. Pinpoint blackhead with surrounding erythema/tenderness No hepatosplenomegaly. No bruits or masses. Good bowel sounds. Extremities: no cyanosis, clubbing, rash, 3-4+ edema Neuro: alert & orientedx3, cranial nerves grossly intact. moves all 4 extremities w/o difficulty. Affect pleasant  Telemetry   Sinus tach  110-120 Personally reviewed  Labs    CBC Recent Labs    10/31/20 0530 11/01/20 0300  WBC 7.0 6.6  HGB 14.6 14.4  HCT 44.7 42.4  MCV 90.9 89.8  PLT 228 231    Basic Metabolic Panel Recent Labs    94/70/96 0530 10/31/20 1300 11/01/20 0300  NA 130* 131* 129*  K 2.8* 2.4* 3.1*  CL 83* 94* 85*  CO2 31 27 31   GLUCOSE 246* 246* 277*  BUN 28* 25* 26*  CREATININE 1.73* 1.48* 1.81*  CALCIUM 9.5 7.3* 9.0  MG 2.1  --  1.8    Liver Function Tests No results for input(s): AST, ALT, ALKPHOS, BILITOT, PROT, ALBUMIN in the last 72 hours.  No results for input(s): LIPASE, AMYLASE in the last 72 hours. Cardiac Enzymes No results for input(s): CKTOTAL, CKMB, CKMBINDEX, TROPONINI in the last 72 hours.  BNP: BNP (last 3 results) Recent Labs    05/01/20 1020 09/11/20 0951 10/28/20 1315  BNP 862.1* 803.3* 1,547.7*     ProBNP (last  3 results) No results for input(s): PROBNP in the last 8760 hours.   D-Dimer No results for input(s): DDIMER in the last 72 hours. Hemoglobin A1C No results for input(s): HGBA1C in the last 72 hours. Fasting Lipid Panel No results for input(s): CHOL, HDL, LDLCALC, TRIG, CHOLHDL, LDLDIRECT in the last 72 hours. Thyroid Function Tests No results for input(s): TSH, T4TOTAL, T3FREE, THYROIDAB in the last 72 hours.  Invalid input(s): FREET3  Other results:   Imaging    VAS [place] UPPER EXTREMITY VENOUS DUPLEX  Result Date: 10/31/2020 UPPER VENOUS STUDY  Patient Name:  Jonathan [name]  Date of Exam:   10/31/2020 Medical Rec #: [id number]       Accession #:    [id number] Date of Birth: 11-03-1985       Patient Gender: M Patient Age:   42 years Exam Location:  [facility] Procedure:      VAS [place] UPPER EXTREMITY VENOUS DUPLEX Referring Phys: Lillia Abed [facility] --------------------------------------------------------------------------------  Indications: Pain, and Edema Limitations: Line and bandages. Comparison Study: no prior Performing Technologist: Argentina Ponder RVS  Examination Guidelines: A complete evaluation includes B-mode imaging, spectral Doppler, color Doppler, and power Doppler as needed of all accessible portions of each vessel. Bilateral testing is considered an integral part of a complete examination. Limited examinations for reoccurring indications may be performed as noted.  Right Findings: +----------+------------+---------+-----------+----------+-------+ RIGHT     CompressiblePhasicitySpontaneousPropertiesSummary +----------+------------+---------+-----------+----------+-------+ IJV           Full       Yes       Yes                      +----------+------------+---------+-----------+----------+-------+ Subclavian    Full       Yes       Yes                      +----------+------------+---------+-----------+----------+-------+ Axillary      Full       Yes       Yes                      +----------+------------+---------+-----------+----------+-------+ Brachial      Full       Yes       Yes                      +----------+------------+---------+-----------+----------+-------+ Radial        Full                                          +----------+------------+---------+-----------+----------+-------+ Ulnar         Full                                          +----------+------------+---------+-----------+----------+-------+ Cephalic      Full                                          +----------+------------+---------+-----------+----------+-------+ Basilic       Full                                           +----------+------------+---------+-----------+----------+-------+  Left Findings: +----------+------------+---------+-----------+----------+-------+ LEFT      CompressiblePhasicitySpontaneousPropertiesSummary +----------+------------+---------+-----------+----------+-------+ Subclavian    Full       Yes       Yes                      +----------+------------+---------+-----------+----------+-------+  Summary:  Right: No evidence of deep vein thrombosis in the upper extremity. No evidence of superficial vein thrombosis in the upper extremity.  Left: No evidence of thrombosis in the subclavian.  *See table(s) above for measurements and observations.    Preliminary      Medications:     Scheduled Medications:  Chlorhexidine Gluconate Cloth  6 each Topical Daily   digoxin  0.125 mg Oral Daily   insulin aspart  0-15 Units Subcutaneous TID WC   insulin aspart  7 Units Subcutaneous TID WC   insulin glargine-yfgn  25 Units Subcutaneous Daily   loratadine  10 mg Oral Daily   melatonin  3 mg Oral QHS   potassium chloride  40 mEq Oral Q8H   sodium chloride flush  10-40 mL Intracatheter Q12H   sodium chloride flush  3 mL Intravenous Q12H   spironolactone  12.5 mg Oral Daily    Infusions:  amiodarone 30 mg/hr (11/01/20 0717)   bivalirudin (ANGIOMAX) infusion 0.5 mg/mL (Non-ACS indications) 0.15 mg/kg/hr (10/31/20 2304)   furosemide (LASIX) 200 mg in dextrose 5% 100 mL (2mg /mL) infusion 20 mg/hr (11/01/20 0309)   milrinone 0.25 mcg/kg/min (11/01/20 0553)   potassium chloride 10 mEq (11/01/20 0819)   sodium chloride      PRN Medications: acetaminophen **OR** acetaminophen, alum & mag hydroxide-simeth, sodium chloride flush  Assessment/Plan   A/C HFrEF, NICM  -Most recent ECHO back in 2021 EF 30-35 % with moderate RV dysfunction in the setting of recurrent RBBB with significant dyssynchrony. Echo this admit with EF < 20% and moderate RV  dysfunction - Marked volume overload w/ low output. On 0.25 milrinone. Co-ox 59% today  - Remains massively volume overloaded. CVP 22. Diuresis modest and patient also taking in lots of fluid. Increase lasix gtt to 30. Add diamox. Careful with metolazone given severe hypokalemia - Intolerant SGLT2i due to candidiasis +uncontrolled DM - Continue spiro 12.5 mg daily - Hopefully can add back entresto in next couple of days - He remains markedly volume overloaded. Co-ox marginal but improved with milrinone. Continues to diurese well weight up > 30+ pounds. Need to diurese fully prior to weaning inotropes. Long talk with him and family that he would not be candidate for VAD or transplant currently due to size and Hgba1c > 14     2. Uncontrolled DM -9/1 Hgb A1C > 14.  -Per primary team.    3. AKI -Baseline creatinine < 1.  - Creatinine has stabilized at 1.6-1.8 - 2/2 low output heart failure/ cardiorenal .  - continue diuresis + inotrope supp - follow BMP    4. Atrial Flutter  - c/w amio gtt while on milrinone - Now back in sinus on IV amio. Continue IV amio while on milrinone - Heparin gtt - Supp K and Mg    5. Hypervolemic Hyponatremia  - Na 125 >127>126>130 - Restrict free water.  - Diurese   7. OSA Severe OSA AHI 65 -  Bipap QHS   8. Morbid Obesity  Body mass index is 51.29 kg/m.  9. H/O medical noncompliance -Reports taking HF medications prior to admit. Missed last few clinic f/u and discharged from HF paramedicine d/t noncompliance.  -  Consider HF paramedicine at discharge if willing  10. Hypokalemia/ Hypomagnesemia - K 3.1, Mg 1.8 - Continue to supp. Increase spiro  11. RUE pain - At site of PICC line. Tender with palpation. Mild swelling - Doppler negative for DVT 9/16  12. Lower abdominal cutaneous lesion - doubt related to systemic heparin with one isolated lesion and certainly not HIT with PLTs > 220k - can switch back to heparin (d/w IMTS and PharmD) given  low likelihood that this is related to systemic heparin and cost implications of bival therapy.   Length of Stay: 4  Arvilla Meres, MD  11/01/2020, 9:13 AM  Advanced Heart Failure Team Pager [phone] (M-F; 7a - 5p)  Please contact [facility] for night-coverage after hours (5p -7a ) and weekends on [email or web address]

## 2020-11-01 NOTE — Progress Notes (Addendum)
ANTICOAGULATION CONSULT NOTE  Pharmacy Consult for heparin  Indication: atrial fibrillation  Allergies  Allergen Reactions   Hydrocodone     Hives      Patient Measurements: Height: 6\' 2"  (188 cm) Weight: (!) 181.2 kg (399 lb 8 oz) IBW/kg (Calculated) : 82.2 Heparin Dosing Weight:  127.7kg  Vital Signs: Temp: 98.1 F (36.7 C) (09/17 1903) Temp Source: Oral (09/17 1903) BP: 134/82 (09/17 1903) Pulse Rate: 96 (09/17 1903)  Labs: Recent Labs    10/30/20 0500 10/30/20 1600 10/31/20 0530 10/31/20 0538 10/31/20 1300 11/01/20 0300 11/01/20 0832 11/01/20 2225  HGB 15.0  --  14.6  --   --  14.4  --   --   HCT 45.3  --  44.7  --   --  42.4  --   --   PLT 237  --  228  --   --  231  --   --   APTT  --   --   --   --   --  65* 67*  --   HEPARINUNFRC 0.36 0.38  --  0.33  --   --   --  0.44  CREATININE 1.72*  --  1.73*  --  1.48* 1.81*  --   --      Estimated Creatinine Clearance: 98.1 mL/min (A) (by C-G formula based on SCr of 1.81 mg/dL (H)).   Assessment: 35 yo M with hx of HFrEF and new atrial flutter. He has been transitioned from bivalirudin to heparin  -heparin level at goal on 2250 units/hr   Goal of Therapy:  Heparin level 0.3-0.7 Monitor platelets by anticoagulation protocol: Yes   Plan:  Continue heparin at 2250 units/hr Monitor daily heparin level and CBC   Thank you for allowing pharmacy to participate in this patient's care.  31, PharmD Clinical Pharmacist **Pharmacist phone directory can now be found on [email or web address] ([email or web address]).  Listed under [facility].

## 2020-11-01 NOTE — Progress Notes (Signed)
RT note: Patient has home CPAP states he will place it on himself.

## 2020-11-01 NOTE — Progress Notes (Addendum)
ANTICOAGULATION CONSULT NOTE  Pharmacy Consult for heparin >> bivalirudin Indication: atrial fibrillation  Allergies  Allergen Reactions   Hydrocodone     Hives     ADDENDUM:  After speaking with Dr. Gala Romney, pharmacy to resume heparin due to low likelihood of HIT and heparin skin necrosis due to normal platelets and IV administration respectively.   Plan: Start heparin at previously therapeutic dose of 2250 units/hr Stop bivalirudin when heparin is started Check heparin level in 8 hours Check daily CBC and heparin level  Patient Measurements: Height: 6\' 2"  (188 cm) Weight: (!) 181.2 kg (399 lb 8 oz) IBW/kg (Calculated) : 82.2 Heparin Dosing Weight:  127.7kg  Vital Signs: Temp: 98.6 F (37 C) (09/17 0405) Temp Source: Oral (09/17 0405) BP: 108/82 (09/17 0405) Pulse Rate: 108 (09/17 0405)  Labs: Recent Labs    10/30/20 0500 10/30/20 1600 10/31/20 0530 10/31/20 0538 10/31/20 1300 11/01/20 0300  HGB 15.0  --  14.6  --   --  14.4  HCT 45.3  --  44.7  --   --  42.4  PLT 237  --  228  --   --  231  APTT  --   --   --   --   --  65*  HEPARINUNFRC 0.36 0.38  --  0.33  --   --   CREATININE 1.72*  --  1.73*  --  1.48* 1.81*     Estimated Creatinine Clearance: 98.1 mL/min (A) (by C-G formula based on SCr of 1.81 mg/dL (H)).   Assessment: 35 yo M with hx of HFrEF and new atrial flutter. Pharmacy consulted for IV heparin, then bivalirudin.   Baseline platelet count is 261 - platelets today 228. Found to have rash on LL abdomen with central dark umbilication. Consulted to switch to bivalirudin for concern of HIT - 4Ts score 2, low probability <5% (points for possible skin necrosis); however in the absence of thrombocytopenia. Will use bivalirudin over argatroban in case any future cardiac procedures needed. No s/sx of bleeding. Ordering HIT antibody. aPTT is therapeutic x 2 at 65 seconds this morning. Will continue current dose.   Goal of Therapy:  aPTT 50-85  seconds Monitor platelets by anticoagulation protocol: Yes   Plan:  Continue bivalirudin at 0.15 mg/kg/hr  Monitor daily aPTT, CBC, and for s/sx of bleeding   Thank you for allowing pharmacy to participate in this patient's care.  31, PharmD PGY1 Pharmacy Resident 11/01/2020 9:09 AM Check [email or web address] for unit specific pharmacy number

## 2020-11-01 NOTE — Progress Notes (Signed)
   Palliative Medicine Inpatient Follow Up Note    Chart reviewed.  Patient assessed at the bedside.  He is sitting up on the side of the bed.  Denies pain or shortness of breath.  States he had a rough night thinking about decisions he needed to make.  His family is at the bedside (wife, sister, and his father is in town).  We reviewed goals of care discussions.  Family had many questions which were valid and all attempts have been made to answer and provide support.  We again discussed at length patient's CODE STATUS with strong recommendations to consider DNR/DNI.  Wife continues to struggle with making final decisions.  Patient's father and sister seems more understanding regards to DNR.  Patient is requesting advanced directive packet to review with family.  Education provided on living will document in addition to the MOLST form.  Both documents have been left with patient and family to further discuss and assist with decision-making.  Patient continues to express his understanding of his poor prognosis.  He shares his wishes would be to return home with his family and friends.  We discussed hospice role outpatient.  Patient and family verbalized understanding and appreciation.  Discussed the importance of continued conversation with family and their  medical providers regarding overall plan of care and treatment options, ensuring decisions are within the context of the patients values and GOCs.  Patient shares family is planning to continue with discussions.   Questions addressed support provided.  Objective Assessment: Vital Signs Vitals:   11/01/20 1144 11/01/20 1300  BP: (!) 127/109   Pulse: (!) 120 (!) 120  Resp: 16   Temp: 98.8 F (37.1 C) 98.8 F (37.1 C)  SpO2: 98% 98%    Intake/Output Summary (Last 24 hours) at 11/01/2020 1441 Last data filed at 11/01/2020 1354 Gross per 24 hour  Intake 3379.57 ml  Output 4325 ml  Net -945.43 ml   Last Weight  Most recent update:  11/01/2020  6:10 AM    Weight  181.2 kg (399 lb 8 oz)              Gen:  NAD, morbidly obese HEENT: moist mucous membranes CV: Tachycardic PULM: clear to auscultation bilaterally. No wheezes/rales/rhonchi ABD: soft/nontender/nondistended/normal bowel sounds EXT: Bilateral lower extremity edema, leg wraps Neuro: Alert and oriented x3, mood appropriate  SUMMARY OF RECOMMENDATIONS   -Patient and family continue to request ongoing family discussions.  Father is at the bedside from [place].  Extensive updates provided.  Strong recommendations to consider DNR/DNI however wife is somewhat resistant to this.  Shares she is remaining somewhat hopeful with awareness of poor long-term prognosis. -MOLST form and advanced directives left at the bedside for patient and family to review and complete necessary sections.  Education provided on both forms. -PMT will continue to support and follow.  Time Total: 50 min.   Visit consisted of counseling and education dealing with the complex and emotionally intense issues of symptom management and palliative care in the setting of serious and potentially life-threatening illness.Greater than 50%  of this time was spent counseling and coordinating care related to the above assessment and plan.  Willette Alma, AGPCNP-BC  Palliative Medicine Team [phone]  Palliative Medicine Team providers are available by phone from 7am to 7pm daily and can be reached through the team cell phone.  Should this patient require assistance outside of these hours, please call the patient's attending physician.

## 2020-11-01 NOTE — Progress Notes (Signed)
HD#4 SUBJECTIVE:  Patient Summary: Jonathan [name] is a 35 y.o. with a pertinent PMH of HFrEF 2/2 NICM, uncontrolled insulin-naive type 2 diabetes, hypertension, OSA, who presented with shortness of breath and abdominal pain. He was admitted for heart failure exacerbation currently requiring inotropes + lasix.   Overnight Events:  Patient had lower abdominal skin lesion that was thought to possibly be related to his systemic heparin. He was briefly transitioned to bivalirudin.   Interim History:   Patient feels like his appetite has improved. He denies chest pain or shortness of breath. He had a BM this AM and continues to urinate without difficulty. He has some abdominal pain where he has a small, purple papule.    OBJECTIVE:  Vital Signs: Vitals:   11/01/20 0050 11/01/20 0405 11/01/20 0500 11/01/20 1022  BP: (!) 135/99 108/82  128/89  Pulse: (!) 120 (!) 108  (!) 117  Resp: (!) 22 13  19   Temp: 98.1 F (36.7 C) 98.6 F (37 C)    TempSrc: Oral Oral    SpO2:  97%    Weight: (!) 181.2 kg  (!) 181.2 kg   Height:       Supplemental O2: Room Air SpO2: 97 %  Filed Weights   10/31/20 0436 11/01/20 0050 11/01/20 0500  Weight: (!) 182.4 kg (!) 181.2 kg (!) 181.2 kg     Intake/Output Summary (Last 24 hours) at 11/01/2020 1243 Last data filed at 11/01/2020 1048 Gross per 24 hour  Intake 2750.51 ml  Output 2900 ml  Net -149.49 ml    Net IO Since Admission: -7,570.5 mL [11/01/20 1243]  Physical Exam: Constitutional: Morbidly obese gentleman sitting on the side of his bed.  No acute distress noted. Cardio: Tachycardia with regular rhythm.  No murmurs noted. Pulm: Clear to auscultation bilaterally.  Normal effort of breathing. Abdomen: Abdomen is soft, distended, mildly TTP in LLQ with purplish papule, surrounding erythema.  Extremities: Lower extremity edema 2-3+ bilaterally, no cyanosis, cool to touch  Neuro: Alert and oriented x3.  No focal deficit noted. Psych: Appropriate  mood and affect.    Patient Lines/Drains/Airways Status     Active Line/Drains/Airways     Name Placement date Placement time Site Days   Peripheral IV 10/28/20 20 G Right Antecubital 10/28/20  0746  Antecubital  3   Peripheral IV 10/31/20 22 G 1.75" Left Forearm 10/31/20  1712  Forearm  less than 1   PICC Double Lumen 10/28/20 PICC Right Cephalic 39 cm 0 cm 10/28/20  10/30/20  -- 3             ASSESSMENT/PLAN:  Assessment: Active Problems:   Diabetes mellitus type 2 in obese (HCC)   Essential hypertension   OSA (obstructive sleep apnea)   Acute on chronic heart failure (HCC)   Goals of care, counseling/discussion   Plan: #Acute on chronic biventricular, low output failure Patient continues to have evidence of volume overload on exam, CVP 22 this AM. He also continues to have low output state requiring milrinone. His Co-ox 59% this AM.  -Advanced heart failure team following, appreciate their recommendations             -Lasix drip increased to 30cc/h and diamox was added. Patient also continued on spironolactone.              -Continue milrinone 13.95 mL/h             -Avoid beta-blockers due to low output             -  K goal greater than 4, Mg goal greater than 2                         -Give an extra dose of K this morning as level was 3.2.             -Consider adding back Entresto this admission -Trend BMP -CVP monitoring -Palliative care consulted, appreciate their assistance   #Aflutter In the setting of milrinone. Patient was started on amiodarone by cardiology while on milrinone + digoxin.   #Uncontrolled type 2 diabetes mellitus Patient continues to have persistently elevated blood sugars during admission.  He was previously on Lantus 20units with SSI at mealtime.  Semeglee was increased to 25 units 9/16. His blood sugars remain elevated in the 200s at meal time and his fasting. Patient does report that his appetite has improved. He required 21 u of short acting  insulin.  -Increased Semeglee to 30u daily and meal time insulin to 10u TID (from Semeglee 25u + 7u TID meals) -CBG monitoring   #Cardiorenal syndrome Patient with AKI likely due to to low output heart failure.  He had 3.5L of urine output over the last 24 hours. sCr worsened on labs this AM.  -Continue IV Lasix and milrinone per above.  -Nephrology consulted, appreciate their recommendations -Trend BMP -Avoid nephrotoxic agents -Strict I's/O's    #HTN #HLD Historically treated for hypertension with carvedilol 25 mg twice daily, Entresto 97-103 mg twice daily, spironolactone 25 mg daily. -Spironolactone 12.5 mg initiated 9/16 -Consider restarting Entresto during this admission   #OSA -BiPAP   #Hypervolemic hyponatremia Hyponatremia persists though stable.  This is likely secondary to volume overload from acute on chronic heart failure exacerbation.  We will continue to diurese. -Trend BMP -Restrict free water  Best Practice: Diet: Cardiac diet IVF: Fluids: None VTE:   Heparin gtt Code: Full AB: None DISPO: Continue to monitor inpatient.  Signature: Marolyn Haller, MD PGY-2 Internal Medicine  Pager [phone]   Please contact the on call pager after 5 pm and on weekends at [phone].

## 2020-11-02 DIAGNOSIS — I10 Essential (primary) hypertension: Secondary | ICD-10-CM | POA: Diagnosis not present

## 2020-11-02 DIAGNOSIS — I5043 Acute on chronic combined systolic (congestive) and diastolic (congestive) heart failure: Secondary | ICD-10-CM

## 2020-11-02 DIAGNOSIS — Z7189 Other specified counseling: Secondary | ICD-10-CM | POA: Diagnosis not present

## 2020-11-02 DIAGNOSIS — E118 Type 2 diabetes mellitus with unspecified complications: Secondary | ICD-10-CM

## 2020-11-02 DIAGNOSIS — G4733 Obstructive sleep apnea (adult) (pediatric): Secondary | ICD-10-CM | POA: Diagnosis not present

## 2020-11-02 DIAGNOSIS — Z515 Encounter for palliative care: Secondary | ICD-10-CM | POA: Diagnosis not present

## 2020-11-02 LAB — COOXEMETRY PANEL
Carboxyhemoglobin: 1.5 % (ref 0.5–1.5)
Methemoglobin: 0.5 % (ref 0.0–1.5)
O2 Saturation: 61 %
Total hemoglobin: 15.2 g/dL (ref 12.0–16.0)

## 2020-11-02 LAB — BASIC METABOLIC PANEL
Anion gap: 20 — ABNORMAL HIGH (ref 5–15)
BUN: 23 mg/dL — ABNORMAL HIGH (ref 6–20)
CO2: 26 mmol/L (ref 22–32)
Calcium: 9.7 mg/dL (ref 8.9–10.3)
Chloride: 83 mmol/L — ABNORMAL LOW (ref 98–111)
Creatinine, Ser: 1.82 mg/dL — ABNORMAL HIGH (ref 0.61–1.24)
GFR, Estimated: 49 mL/min — ABNORMAL LOW (ref >60)
Glucose, Bld: 244 mg/dL — ABNORMAL HIGH (ref 70–99)
Potassium: 3.3 mmol/L — ABNORMAL LOW (ref 3.5–5.1)
Sodium: 129 mmol/L — ABNORMAL LOW (ref 135–145)

## 2020-11-02 LAB — GLUCOSE, CAPILLARY
Glucose-Capillary: 250 mg/dL — ABNORMAL HIGH (ref 70–99)
Glucose-Capillary: 222 mg/dL — ABNORMAL HIGH (ref 70–99)
Glucose-Capillary: 218 mg/dL — ABNORMAL HIGH (ref 70–99)
Glucose-Capillary: 213 mg/dL — ABNORMAL HIGH (ref 70–99)

## 2020-11-02 LAB — CBC
HCT: 44.9 % (ref 39.0–52.0)
Hemoglobin: 14.8 g/dL (ref 13.0–17.0)
MCH: 30 pg (ref 26.0–34.0)
MCHC: 33 g/dL (ref 30.0–36.0)
MCV: 90.9 fL (ref 80.0–100.0)
Platelets: 232 10*3/uL (ref 150–400)
RBC: 4.94 MIL/uL (ref 4.22–5.81)
RDW: 13.9 % (ref 11.5–15.5)
WBC: 6.7 10*3/uL (ref 4.0–10.5)
nRBC: 0 % (ref 0.0–0.2)

## 2020-11-02 LAB — HEPARIN LEVEL (UNFRACTIONATED): Heparin Unfractionated: 0.34 IU/mL (ref 0.30–0.70)

## 2020-11-02 LAB — MAGNESIUM: Magnesium: 2 mg/dL (ref 1.7–2.4)

## 2020-11-02 MED ORDER — POTASSIUM CHLORIDE CRYS ER 20 MEQ PO TBCR
40.0000 meq | EXTENDED_RELEASE_TABLET | Freq: Three times a day (TID) | ORAL | Status: AC
  Administered 2020-11-02 (×3): 40 meq via ORAL
  Filled 2020-11-02 (×3): qty 2

## 2020-11-02 NOTE — Plan of Care (Signed)

## 2020-11-02 NOTE — Progress Notes (Signed)
ANTICOAGULATION CONSULT NOTE  Pharmacy Consult for heparin  Indication: atrial fibrillation  Allergies  Allergen Reactions   Hydrocodone     Hives      Patient Measurements: Height: 6\' 2"  (188 cm) Weight: (!) 177.1 kg (390 lb 6.4 oz) IBW/kg (Calculated) : 82.2 Heparin Dosing Weight:  127.7kg  Vital Signs: Temp: 98.3 F (36.8 C) (09/18 0816) Temp Source: Oral (09/18 0816) BP: 128/103 (09/18 0816) Pulse Rate: 111 (09/18 0415)  Labs: Recent Labs    10/31/20 0530 10/31/20 0538 10/31/20 1300 11/01/20 0300 11/01/20 0832 11/01/20 2225 11/02/20 0500 11/02/20 0650  HGB 14.6  --   --  14.4  --   --  14.8  --   HCT 44.7  --   --  42.4  --   --  44.9  --   PLT 228  --   --  231  --   --  232  --   APTT  --   --   --  65* 67*  --   --   --   HEPARINUNFRC  --  0.33  --   --   --  0.44  --  0.34  CREATININE 1.73*  --  1.48* 1.81*  --   --  1.82*  --      Estimated Creatinine Clearance: 96.3 mL/min (A) (by C-G formula based on SCr of 1.82 mg/dL (H)).   Assessment: 35 yo M with hx of HFrEF and new atrial flutter. He has been transitioned from bivalirudin to heparin. Heparin level at goal on 2250 units/hr. No bleeding noted. CBC stable this morning.   Goal of Therapy:  Heparin level 0.3-0.7 Monitor platelets by anticoagulation protocol: Yes   Plan:  Continue heparin at 2250 units/hr Monitor daily heparin level and CBC   Thank you for allowing pharmacy to participate in this patient's care.  31, PharmD PGY1 Pharmacy Resident 11/02/2020 9:00 AM Check [email or web address] for unit specific pharmacy number

## 2020-11-02 NOTE — Progress Notes (Addendum)
Palliative Medicine Inpatient Follow Up Note  Reason for Consultation: Establishing goals of care   HPI/Patient Profile: Palliative Care consult requested for goals of care discussion in this 35 y.o. male  with past medical history of CHF, asthma, obesity, uncontrolled diabetes, EF 30-35% (11/2019), hypertension, and OSA. He was admitted on 10/28/2020 from home with complaints of abdominal pain and shortness of breath. Patient is being followed by the Heart Failure team. Started on empiric milrinone, amio gtt, and IV lasix. Echo now showing EF <20%.   Today's Discussion (11/02/2020):  *Please note that this is a verbal dictation therefore any spelling or grammatical errors are due to the "[place] One" system interpretation.  Chart reviewed.  I met with Jonathan [name] at bedside this morning. He shares that he is sleepy but his spouse has some more questions for the Palliative care team. I asked him if he would be okay with me reaching out her which he consented to.  I called Jonathan [name] this afternoon. I introduced myself as one of the Palliative providers asked to get involved in Jonathan [name]'s care. We reviewed his heart failure and She shares with me that she is unsure of what the medical team indicates when they says that Jonathan [name] is not doing well. I was very blunt about his condition based upon her request and shared that I would truly consider hospice care as I worry when Jonathan [name] is titrated off of his IV drips we are going to be in another acute situation in a very short period of time based upon his disease burden. We reviewed additionally the reasons why DNR code status had been suggested in the setting of patients underlying heart failure and unlikelihood of meaning resuscitation. We discussed that DNR does not mean do not treat.  Clarity was provided.  Jonathan [name] plans to be in the hospital tomorrow morning and asked if we could meet then which I shared we could. We plan to meet at 8AM.  Questions and  concerns addressed   Objective Assessment: Vital Signs Vitals:   11/02/20 0816 11/02/20 1112  BP: (!) 128/103 (!) 126/106  Pulse:    Resp: 20 19  Temp: 98.3 F (36.8 C) 98.5 F (36.9 C)  SpO2: 96% 95%    Intake/Output Summary (Last 24 hours) at 11/02/2020 1201 Last data filed at 11/02/2020 1114 Gross per 24 hour  Intake 2132.25 ml  Output 7610 ml  Net -5477.75 ml   Last Weight  Most recent update: 11/02/2020  1:51 AM    Weight  177.1 kg (390 lb 6.4 oz)              Gen:  NAD, morbidly obese HEENT: moist mucous membranes CV: Tachycardic PULM: clear to auscultation bilaterally. No wheezes/rales/rhonchi ABD: soft/nontender/nondistended/normal bowel sounds EXT: Bilateral lower extremity edema, leg wraps Neuro: Alert and oriented x3, mood appropriate  SUMMARY OF RECOMMENDATIONS   - Full code --> Strong recommendations to consider Jonathan [name]  - Plan for meeting with Jonathan [name] and his spouse Jonathan [name] tomorrow -PMT will continue to support and follow.  Time Spent: 35 Greater than 50% of the time was spent in counseling and coordination of care ______________________________________________________________________________________ [place] Team Team Cell Phone: [phone] Please utilize secure chat with additional questions, if there is no response within 30 minutes please call the above phone number  Palliative Medicine Team providers are available by phone from 7am to 7pm daily and can be reached through the team cell phone.  Should this patient  require assistance outside of these hours, please call the patient's attending physician.     

## 2020-11-02 NOTE — Progress Notes (Signed)
Patient ID: Jonathan [name], male   DOB: [birth year], 35 y.o.   MRN: [id number]     Advanced Heart Failure Rounding Note  PCP-Cardiologist: Armanda Magic, MD  HF MD: Dr. Gala Romney   Patient Profile   Jonathan [name] is a 35 year old male with history of HFrEF, NICM, HTN, asthma, OSA, morbid obesity, and uncontrolled DM. Hx of medical noncompliance. Admitted on 09/13 with acute on chronic systolic HF, severe hyperglycemia, AKI and abdominal pain.    Subjective:    09/13: Started on Empiric milrinone 0.25, lasix gtt at 10/hr 09/14: Developed AFL w/ RVR>>amio gtt started   On 0.25 milrinone. Co-ox 61%  Remains on lasix gtt at 30 mg/hr and acetazolamide 250 mg bid.  I/Os markedly negative with weight down 9 lbs.  CVP still very high at 28 this morning. Creatinine stable at 1.8.   Cardiac Studies   Echo 09/22: EF < 20%, RV fxn moderately reduced, trivial MR, mild TR Echo 10/21: EF 30-35% with moderate RV dysfunction in setting of recurrent RBBB with significant dyssynchrony.  ECHO 04/2019: EF 30-35%  ECHO 2019: EF 55%   Echo EF 15-20% in [place] in 2017. Cath around that time without any significant CAD per patient.  Objective:   Weight Range: (!) 177.1 kg Body mass index is 50.12 kg/m.   Vital Signs:   Temp:  [97.4 F (36.3 C)-98.8 F (37.1 C)] 98.3 F (36.8 C) (09/18 0816) Pulse Rate:  [96-120] 111 (09/18 0415) Resp:  [16-20] 20 (09/18 0816) BP: (118-134)/(82-109) 128/103 (09/18 0816) SpO2:  [95 %-98 %] 96 % (09/18 0816) Weight:  [177.1 kg] 177.1 kg (09/18 0150) Last BM Date: 11/01/20  Weight change: Filed Weights   11/01/20 0050 11/01/20 0500 11/02/20 0150  Weight: (!) 181.2 kg (!) 181.2 kg (!) 177.1 kg    Intake/Output:   Intake/Output Summary (Last 24 hours) at 11/02/2020 0911 Last data filed at 11/02/2020 0815 Gross per 24 hour  Intake 2332.25 ml  Output 7235 ml  Net -4902.75 ml      Physical Exam   CVP 28 General: NAD Neck: JVP 16+, no thyromegaly or thyroid  nodule.  Lungs: Decreased at bases.  CV: Nondisplaced PMI.  Heart tachy, regular S1/S2, no S3/S4, no murmur.  2+ edema to knees.   Abdomen: Soft, nontender, no hepatosplenomegaly, no distention.  Skin: Intact without lesions or rashes.  Neurologic: Alert and oriented x 3.  Psych: Normal affect. Extremities: No clubbing or cyanosis.  HEENT: Normal.    Telemetry   Sinus tachycardia 110s Personally reviewed  Labs    CBC Recent Labs    11/01/20 0300 11/02/20 0500  WBC 6.6 6.7  HGB 14.4 14.8  HCT 42.4 44.9  MCV 89.8 90.9  PLT 231 232   Basic Metabolic Panel Recent Labs    12/01/49 0300 11/02/20 0500  NA 129* 129*  K 3.1* 3.3*  CL 85* 83*  CO2 31 26  GLUCOSE 277* 244*  BUN 26* 23*  CREATININE 1.81* 1.82*  CALCIUM 9.0 9.7  MG 1.8 2.0   Liver Function Tests No results for input(s): AST, ALT, ALKPHOS, BILITOT, PROT, ALBUMIN in the last 72 hours.  No results for input(s): LIPASE, AMYLASE in the last 72 hours. Cardiac Enzymes No results for input(s): CKTOTAL, CKMB, CKMBINDEX, TROPONINI in the last 72 hours.  BNP: BNP (last 3 results) Recent Labs    05/01/20 1020 09/11/20 0951 10/28/20 1315  BNP 862.1* 803.3* 1,547.7*    ProBNP (last 3 results) No results for  input(s): PROBNP in the last 8760 hours.   D-Dimer No results for input(s): DDIMER in the last 72 hours. Hemoglobin A1C No results for input(s): HGBA1C in the last 72 hours. Fasting Lipid Panel No results for input(s): CHOL, HDL, LDLCALC, TRIG, CHOLHDL, LDLDIRECT in the last 72 hours. Thyroid Function Tests No results for input(s): TSH, T4TOTAL, T3FREE, THYROIDAB in the last 72 hours.  Invalid input(s): FREET3  Other results:   Imaging    No results found.   Medications:     Scheduled Medications:  acetaZOLAMIDE  250 mg Oral BID   Chlorhexidine Gluconate Cloth  6 each Topical Daily   digoxin  0.125 mg Oral Daily   insulin aspart  0-15 Units Subcutaneous TID WC   insulin aspart  10  Units Subcutaneous TID WC   insulin glargine-yfgn  30 Units Subcutaneous Daily   loratadine  10 mg Oral Daily   melatonin  3 mg Oral QHS   potassium chloride  40 mEq Oral Q8H   sodium chloride flush  10-40 mL Intracatheter Q12H   sodium chloride flush  3 mL Intravenous Q12H   spironolactone  25 mg Oral Daily    Infusions:  amiodarone 30 mg/hr (11/02/20 0756)   furosemide (LASIX) 200 mg in dextrose 5% 100 mL (2mg /mL) infusion 30 mg/hr (11/02/20 0309)   heparin 2,250 Units/hr (11/02/20 0314)   milrinone 0.25 mcg/kg/min (11/02/20 0307)   sodium chloride      PRN Medications: acetaminophen **OR** acetaminophen, alum & mag hydroxide-simeth, sodium chloride flush  Assessment/Plan   A/C HFrEF, NICM  -Most recent ECHO back in 2021 EF 30-35 % with moderate RV dysfunction in the setting of recurrent RBBB with significant dyssynchrony. Echo this admit with EF < 20% and moderate RV dysfunction - Marked volume overload w/ low output. On 0.25 milrinone. Co-ox 61% today  - Remains massively volume overloaded. CVP 28. Good diuresis yesterday on current regimen but has a long way to go.  Continue Lasix 30 mg/hr and acetazolamide 250 mg bid.  Watch HCO3 (lower at 26 today).  - Intolerant SGLT2i due to candidiasis +uncontrolled DM - Continue spiro 25 mg daily - Continue digoxin 0.125.  - Hopefully can add back Entresto soon.  - He remains markedly volume overloaded. Co-ox marginal but improved with milrinone. Need to diurese fully prior to weaning inotropes. Not candidate for VAD or transplant currently due to size and Hgba1c > 14     2. Uncontrolled DM -9/1 Hgb A1C > 14.  -Per primary team.    3. AKI -Baseline creatinine < 1.  - Creatinine has stable today at 1.8.  - 2/2 low output heart failure/ cardiorenal .  - continue diuresis + inotrope supp - follow BMP    4. Atrial Flutter  - c/w amio gtt while on milrinone - Now back in sinus on IV amio. Continue IV amio while on milrinone -  Heparin gtt - Supp K and Mg    5. Hypervolemic Hyponatremia  - Na 125 >127>126>130>129 - Restrict free water.  - Diurese   7. OSA: Severe OSA AHI 65 -  Bipap QHS   8. Morbid Obesity  Body mass index is 50.12 kg/m.  9. H/O medical noncompliance -Reports taking HF medications prior to admit. Missed last few clinic f/u and discharged from HF paramedicine d/t noncompliance.  -Consider HF paramedicine at discharge if willing  10. Hypokalemia/ Hypomagnesemia - Replace K today.   Length of Stay: 5  11/1, MD  11/02/2020, 9:11 AM  Advanced Heart Failure Team Pager [phone] (M-F; 7a - 5p)  Please contact [facility] for night-coverage after hours (5p -7a ) and weekends on [email or web address]

## 2020-11-02 NOTE — Progress Notes (Signed)
HD#5 SUBJECTIVE:  Patient Summary: Jonathan [name] is a 35 y.o. with a pertinent PMH of HFrEF 2/2 NICM, uncontrolled insulin-naive type 2 diabetes, hypertension, OSA, who presented with shortness of breath and abdominal pain. He was admitted for heart failure exacerbation currently requiring inotropes + lasix.   Overnight Events:  No acute events overnight.   Interim History:  Patient reports he feels okay. He denies shortness of breath with sitting though he does note some with ambulating. He feels the swelling in his legs has gone down some with diuresis and being adherent to the water restriction. He denies chest pain or shortness of breath. His abdominal pain has subsided and patient no longer has pain in his lower extremities.     OBJECTIVE:  Vital Signs: Vitals:   11/02/20 0150 11/02/20 0415 11/02/20 0816 11/02/20 1112  BP:  (!) 118/95 (!) 128/103 (!) 126/106  Pulse:  (!) 111    Resp:  17 20 19   Temp:  98.1 F (36.7 C) 98.3 F (36.8 C) 98.5 F (36.9 C)  TempSrc:  Oral Oral Oral  SpO2:  96% 96% 95%  Weight: (!) 177.1 kg     Height:       Supplemental O2: Room Air SpO2: 95 %  Filed Weights   11/01/20 0050 11/01/20 0500 11/02/20 0150  Weight: (!) 181.2 kg (!) 181.2 kg (!) 177.1 kg     Intake/Output Summary (Last 24 hours) at 11/02/2020 1459 Last data filed at 11/02/2020 1429 Gross per 24 hour  Intake 1743.19 ml  Output 8335 ml  Net -6591.81 ml    Net IO Since Admission: -14,958.25 mL [11/02/20 1459]  Physical Exam: Constitutional:  No acute distress. Cardio: Tachycardia with regular rhythm.  No murmurs noted. Pulm: Clear to auscultation bilaterally.  Non labored breathing on RA.  Abdomen: Abdomen is soft, distended, non tender to palpation, LLQ lesion with small bandage  Extremities: Lower extremity edema 2-3+ bilaterally, no cyanosis, not painful to palpation  Neuro: Alert and oriented x3.  No focal deficit noted. Psych: Appropriate mood and  affect.    Patient Lines/Drains/Airways Status     Active Line/Drains/Airways     Name Placement date Placement time Site Days   Peripheral IV 10/28/20 20 G Right Antecubital 10/28/20  0746  Antecubital  3   Peripheral IV 10/31/20 22 G 1.75" Left Forearm 10/31/20  1712  Forearm  less than 1   PICC Double Lumen 10/28/20 PICC Right Cephalic 39 cm 0 cm 10/28/20  10/30/20  -- 3             ASSESSMENT/PLAN:  Assessment: Active Problems:   Diabetes mellitus type 2 in obese (HCC)   Essential hypertension   OSA (obstructive sleep apnea)   Acute on chronic heart failure (HCC)   Goals of care, counseling/discussion   Plan: #Acute on chronic biventricular, low output failure Patient continues to have evidence of volume overload on exam and CVP 28 this AM. Though he did have L of UOP over the last 24h and is down 9lb. He also continues to have low output state requiring milrinone. His Co-ox 61% this AM.  -Advanced heart failure team following, appreciate their recommendations             -Lasix drip continued at 30cc/h + acetazolamide. Patient also continued on spironolactone.              -Continue milrinone 13.95 mL/h             -Avoid  beta-blockers due to low output             -K goal greater than 4, Mg goal greater than 2                         -Give an extra dose of K this morning as level was 3.2.             -Consider adding back Entresto this admission -Trend BMP -CVP monitoring -Palliative care consulted, appreciate their assistance   #Aflutter In the setting of milrinone. Patient currently in sinus tachycardia.  -Continue amiodarone, digoxin, and heparin infusion   #Uncontrolled type 2 diabetes mellitus Patient continues to have persistently elevated blood sugars during admission.  He was previously on Lantus 20units with SSI at mealtime.  Semeglee was increased to 25 units 9/16. His blood sugars remain elevated in the 200s at meal time and his fasting. He required 19 u  of short acting insulin.  -Increased Semeglee to 30u daily this AM and meal time insulin to 10u TID (from Semeglee 25u + 7u TID meals), will monitor blood glucoses on this regimen, will uptitrate insulin in the AM  -CBG monitoring   #Cardiorenal syndrome Patient with AKI likely due to to low output heart failure.  He had &L of urine output over the last 24 hours. sCr worsened on labs this AM.  -Continue IV Lasix and milrinone per above.  -Nephrology consulted, appreciate their recommendations -Trend BMP -Avoid nephrotoxic agents -Strict I's/O's    #HTN #HLD Historically treated for hypertension with carvedilol 25 mg twice daily, Entresto 97-103 mg twice daily, spironolactone 25 mg daily. -Spironolactone 12.5 mg initiated 9/16 -Consider restarting Entresto during this admission   #OSA -BiPAP   #Hypervolemic hyponatremia Hyponatremia persists though stable.  This is likely secondary to volume overload from acute on chronic heart failure exacerbation.  We will continue to diurese. -Trend BMP -Restrict free water  Best Practice: Diet: Cardiac diet IVF: Fluids: None VTE:   Heparin gtt Code: Full AB: None DISPO: Continue to monitor inpatient.  Signature: Marolyn Haller, MD PGY-2 Internal Medicine  Pager [phone]   Please contact the on call pager after 5 pm and on weekends at [phone].

## 2020-11-03 DIAGNOSIS — Z515 Encounter for palliative care: Secondary | ICD-10-CM | POA: Diagnosis not present

## 2020-11-03 DIAGNOSIS — I10 Essential (primary) hypertension: Secondary | ICD-10-CM | POA: Diagnosis not present

## 2020-11-03 DIAGNOSIS — E1169 Type 2 diabetes mellitus with other specified complication: Secondary | ICD-10-CM | POA: Diagnosis not present

## 2020-11-03 DIAGNOSIS — Z7189 Other specified counseling: Secondary | ICD-10-CM | POA: Diagnosis not present

## 2020-11-03 DIAGNOSIS — E119 Type 2 diabetes mellitus without complications: Secondary | ICD-10-CM

## 2020-11-03 DIAGNOSIS — I5023 Acute on chronic systolic (congestive) heart failure: Secondary | ICD-10-CM | POA: Diagnosis not present

## 2020-11-03 DIAGNOSIS — E669 Obesity, unspecified: Secondary | ICD-10-CM | POA: Diagnosis not present

## 2020-11-03 DIAGNOSIS — I509 Heart failure, unspecified: Secondary | ICD-10-CM | POA: Diagnosis not present

## 2020-11-03 LAB — GLUCOSE, CAPILLARY
Glucose-Capillary: 199 mg/dL — ABNORMAL HIGH (ref 70–99)
Glucose-Capillary: 226 mg/dL — ABNORMAL HIGH (ref 70–99)
Glucose-Capillary: 220 mg/dL — ABNORMAL HIGH (ref 70–99)
Glucose-Capillary: 259 mg/dL — ABNORMAL HIGH (ref 70–99)

## 2020-11-03 LAB — BASIC METABOLIC PANEL
Anion gap: 13 (ref 5–15)
BUN: 25 mg/dL — ABNORMAL HIGH (ref 6–20)
CO2: 31 mmol/L (ref 22–32)
Calcium: 9.7 mg/dL (ref 8.9–10.3)
Chloride: 86 mmol/L — ABNORMAL LOW (ref 98–111)
Creatinine, Ser: 1.83 mg/dL — ABNORMAL HIGH (ref 0.61–1.24)
GFR, Estimated: 49 mL/min — ABNORMAL LOW (ref >60)
Glucose, Bld: 190 mg/dL — ABNORMAL HIGH (ref 70–99)
Potassium: 3.3 mmol/L — ABNORMAL LOW (ref 3.5–5.1)
Sodium: 130 mmol/L — ABNORMAL LOW (ref 135–145)

## 2020-11-03 LAB — CBC
HCT: 47.1 % (ref 39.0–52.0)
Hemoglobin: 15.7 g/dL (ref 13.0–17.0)
MCH: 30.3 pg (ref 26.0–34.0)
MCHC: 33.3 g/dL (ref 30.0–36.0)
MCV: 90.8 fL (ref 80.0–100.0)
Platelets: 212 10*3/uL (ref 150–400)
RBC: 5.19 MIL/uL (ref 4.22–5.81)
RDW: 13.7 % (ref 11.5–15.5)
WBC: 5.9 10*3/uL (ref 4.0–10.5)
nRBC: 0 % (ref 0.0–0.2)

## 2020-11-03 LAB — COOXEMETRY PANEL
Carboxyhemoglobin: 1.3 % (ref 0.5–1.5)
Methemoglobin: 0.6 % (ref 0.0–1.5)
O2 Saturation: 52.3 %
Total hemoglobin: 14.2 g/dL (ref 12.0–16.0)

## 2020-11-03 LAB — HEPARIN INDUCED PLATELET AB (HIT ANTIBODY): Heparin Induced Plt Ab: 0.08 OD (ref 0.000–0.400)

## 2020-11-03 LAB — MAGNESIUM: Magnesium: 2 mg/dL (ref 1.7–2.4)

## 2020-11-03 LAB — HEPARIN LEVEL (UNFRACTIONATED): Heparin Unfractionated: 0.5 IU/mL (ref 0.30–0.70)

## 2020-11-03 MED ORDER — INSULIN ASPART 100 UNIT/ML IJ SOLN
10.0000 [IU] | Freq: Three times a day (TID) | INTRAMUSCULAR | Status: DC
  Administered 2020-11-03 – 2020-11-04 (×4): 10 [IU] via SUBCUTANEOUS

## 2020-11-03 MED ORDER — INSULIN GLARGINE-YFGN 100 UNIT/ML ~~LOC~~ SOLN
35.0000 [IU] | Freq: Every day | SUBCUTANEOUS | Status: DC
  Administered 2020-11-03 – 2020-11-04 (×2): 35 [IU] via SUBCUTANEOUS
  Filled 2020-11-03 (×2): qty 0.35

## 2020-11-03 MED ORDER — POTASSIUM CHLORIDE CRYS ER 20 MEQ PO TBCR
40.0000 meq | EXTENDED_RELEASE_TABLET | Freq: Three times a day (TID) | ORAL | Status: DC
  Administered 2020-11-03 (×2): 40 meq via ORAL
  Filled 2020-11-03 (×2): qty 2

## 2020-11-03 MED ORDER — POTASSIUM CHLORIDE CRYS ER 20 MEQ PO TBCR
40.0000 meq | EXTENDED_RELEASE_TABLET | Freq: Four times a day (QID) | ORAL | Status: DC
  Administered 2020-11-03 – 2020-11-05 (×10): 40 meq via ORAL
  Filled 2020-11-03 (×10): qty 2

## 2020-11-03 NOTE — Progress Notes (Signed)
This chaplain attempted to respond to PMT spiritual care consult for notarizing the Pt. AD.    The room is dark and both the Pt. and visitor are asleep at the time of the visit.  This chaplain will refer the consult to the spiritual care team for F/U.

## 2020-11-03 NOTE — Hospital Course (Addendum)
#Acute on chronic biventricular, low output failure On presentation to ED patient had marked volume overload.  BNP was elevated at 1547 and CT abdomen showed ascites.  He was given Lasix 80 mg IV and initiated on Lasix drip at 10 mg/h.  PICC line was placed for monitoring of CVP and co-ox.  Echocardiogram was completed on 9/13 and showed global hypokinesis with apical akinesis, LV EF less than 20% with global hypokinesis, consistent with grade 2 diastolic dysfunction; RV moderately reduced systolic function with normal size; mild dilation of RA.  He was initiated on 0.25 mcg of milrinone, digoxin 0.125 mg daily.  Lasix drip was increased to 20 mg/h on 9/14.  He also received 2.5 mg of metolazone 9/15 as well as 9/16.  On 9/16 spironolactone 12.5 mg daily was added to his regimen.  On 9/17, Lasix was increased to 30 mg/h, spironolactone was increased to 25 mg daily , and acetazolamide 250 mg twice daily was added.  On 9/20 he was given an additional dose of metolazone 2.5 mg.  On 9/21, lactic acid was elevated at 2.0 and Co. ox was 49%.  Milrinone was increased at that time to 0.375 mcg.  On 9/22, the heart failure team felt that he was reaching maximal diuresis and discontinued Lasix and acetazolamide.  He was transitioned to torsemide 100 mg twice daily.  Milrinone was decreased to 0.25 mcg.  On 9/23, milrinone was decreased to 0.2 mcg.  Cox testing continued to trend downward with weaning of milrinone.  On 9/24, milrinone was further decreased to 0.25 mcg.  Discussion was had with the patient and his wife regarding his now evident inotrope dependence.  At that time they wish to proceed with a trial of home inotropes.  Due to poor response to weaning, patient was switched from torsemide 100 mg p.o. twice daily to Lasix 120 mg IV twice daily on 9/26.  This was again reverted to torsemide 100 mg p.o. twice daily 9/27.  At this time he is also initiated on metolazone 2.5 mg Monday, Wednesday, Fridays. He is overall  net -39.9 and down 28.2 kg this admission.    #Aflutter At admission patient was in sinus tachycardia.  9/14 morning rate of 150 bpm with regular rhythm.  Stat EKG was ordered and showed sinus tachycardia with short PR and premature supraventricular complexes as well as right bundle branch block.  He was given a bolus of amiodarone 150 mg and started on amiodarone drip at 60 mg/h, transition to 30 mg/h, and heparin drip.  On 9/22 he was transitioned from heparin to Eliquis 5 mg twice daily.  He was transitioned to amiodarone 200 mg p.o. twice daily on 9/26.  On day of discharge, patient remained in sinus rhythm.    #Uncontrolled type 2 diabetes mellitus On presentation, blood sugars were elevated between 203 150.  He was initiated on Lantus 10 units daily.  Beta hydroxybutyrate was elevated at 0.17.  On 9/14 Lantus was increased to 20 units.  On 9/15, 5U of scheduled insulin was added to his regimen in addition to sliding scale insulin at mealtimes.  On 9/16, Lantus was increased to 25 units/day with increase of his scheduled mealtime insulin of 7 units plus sliding scale insulin.  This was increased again on 9/17 to 30 units daily with increase of scheduled mealtime insulin to 10 units with mealtime sliding scale insulin.  On 9/19 this was increased to 35 units.  Due to persistent hyperglycemia despite long-acting, scheduled mealtime, sliding  scale insulin at mealtime, patient was transitioned to [facility] 25 units twice daily in addition to 12 units scheduled at mealtimes with sliding scale insulin.  On 9/21, scheduled mealtime insulin was increased to 15 units with sliding scale insulin.  In discussions regarding getting better glucose control, the patient stated that he is on Trulicity 1.5 mg at home however he did not bring this in during the admission.  On 9/25, Semglee was increased to 30 units twice daily.  His mealtime scheduled insulin was also increased to 20 units with sliding scale.     #Cardiorenal syndrome Creatinine on presentation was 1.63, from a baseline of 0.93.  He described decreased urine output over the week prior to emergency department presentation.  His renal function was stable over the course of this admission, however as changes were made to inotrope therapy, renal function slightly declined again.  He had a creatinine of 1.63 on day of discharge.    #HTN Chronic condition, historically treated with carvedilol 25 mg twice daily, Entresto 97-103 mg twice daily, spironolactone 25 mg daily.  These were held until 9/16 at which time spironolactone 12.5 mg was added to regimen.  This was increased to 25 mg/day on 9/17.   #HLD Chronically managed.    #OSA Chronic condition.  Patient had BiPAP each night.    #Hypervolemic hyponatremia Patient hyponatremic on presentation with sodium of 125.  He was given a dose of tolvaptan at that time.  Hyponatremia persisted throughout this admission.  Sodium level on day of discharge of 132.

## 2020-11-03 NOTE — TOC Initial Note (Signed)
Transition of Care [facility]) - Initial/Assessment Note    Patient Details  Name: Jonathan [name] MRN: [id number] Date of Birth: 12/21/1985  Transition of Care [facility]) CM/SW Contact:    Elliot Cousin, RN Phone Number: [phone] 11/03/2020, 1:21 PM  Clinical Narrative:                  HF TOC CM spoke to pt and wife at bedside. Offered choice for Home Hospice and Palliative Services. Pt and wife agreeable to [facility] for Palliative Services at home. Contacted [facility] rep, Tim to arrange. States he will talk to pt and wife today. Pt has 3n1 bedside commode at bedside. He has RW, Bipap, and ortho boot at home. Wife inquired about a [place] Medicaid PCS (aide) at home. Explained PCP can complete paperwork and fax to [facility] for agency to set up an assessment to see if pt will qualify for aide to come a few days per week to assist with ADLs.      Expected Discharge Plan: Home w Home Health Services Barriers to Discharge: Continued Medical Work up   Patient Goals and CMS Choice Patient states their goals for this hospitalization and ongoing recovery are:: wants to remain independent at home CMS Medicare.gov Compare Post Acute Care list provided to:: Patient Choice offered to / list presented to : Spouse, Patient  Expected Discharge Plan and Services Expected Discharge Plan: Home w Home Health Services In-house Referral: Clinical Social Work Discharge Planning Services: CM Consult Post Acute Care Choice: Home Health Living arrangements for the past 2 months: Single Family Home                   DME Agency: [employer] Date DME Agency Contacted: 11/03/20 Time DME Agency Contacted: [phone] Representative spoke with at DME Agency: Velna Hatchet            Prior Living Arrangements/Services Living arrangements for the past 2 months: Single Family Home Lives with:: Spouse Patient language and need for interpreter reviewed:: Yes Do you feel safe going back to the place where you live?: Yes       Need for Family Participation in Patient Care: Yes (Comment) Care giver support system in place?: Yes (comment)   Criminal Activity/Legal Involvement Pertinent to Current Situation/Hospitalization: No - Comment as needed  Activities of Daily Living Home Assistive Devices/Equipment: None ADL Screening (condition at time of admission) Patient's cognitive ability adequate to safely complete daily activities?: Yes Is the patient deaf or have difficulty hearing?: No Does the patient have difficulty seeing, even when wearing glasses/contacts?: No Does the patient have difficulty concentrating, remembering, or making decisions?: No Patient able to express need for assistance with ADLs?: Yes Does the patient have difficulty dressing or bathing?: No Independently performs ADLs?: Yes (appropriate for developmental age) Does the patient have difficulty walking or climbing stairs?: No Weakness of Legs: Both Weakness of Arms/Hands: None  Permission Sought/Granted Permission sought to share information with : Case Manager, PCP, Family Supports Permission granted to share information with : Yes, Verbal Permission Granted  Share Information with NAME: Christean Leaf  Permission granted to share info w AGENCY: DME, Home Health, Home Hospice and palliative  Permission granted to share info w Relationship: wife  Permission granted to share info w Contact Information: [phone]  Emotional Assessment Appearance:: Appears stated age Attitude/Demeanor/Rapport: Gracious, Engaged Affect (typically observed): Accepting Orientation: : Oriented to Self, Oriented to Place, Oriented to  Time, Oriented to Situation   Psych Involvement: No (comment)  Admission diagnosis:  Acute exacerbation of CHF (congestive heart failure) (HCC) [I50.9] Acute on chronic heart failure, unspecified heart failure type (HCC) [I50.9] Type 2 diabetes mellitus with other specified complication, without long-term current use  of insulin (HCC) [E11.69] Patient Active Problem List   Diagnosis Date Noted   Goals of care, counseling/discussion    Acute on chronic heart failure (HCC) 10/28/2020   Intertrigo 10/17/2020   Abdominal pain 08/20/2020   Mold exposure 06/17/2020   Candidiasis of penis 12/08/2018   Depression 11/06/2017   Charcot ankle, left 05/31/2017   Acute pain of left foot 05/03/2017   Diabetic polyneuropathy associated with type 2 diabetes mellitus (HCC) 03/14/2017   OSA (obstructive sleep apnea) 12/02/2016   Chronic systolic (congestive) heart failure (HCC)    Morbid obesity with BMI of 50.0-59.9, adult (HCC) 10/26/2016   Asthma, chronic, mild persistent, uncomplicated 10/26/2016   Essential hypertension 10/26/2016   Diabetes mellitus type 2 in obese (HCC) 04/20/2016   PCP:  Dolan Amen, MD Pharmacy:   [facility] [phone] - [place], [place] - [address] AT [facility] OF [address]  [place] [address] Phone: [phone] Fax: [phone]     Social Determinants of Health (SDOH) Interventions Food Insecurity Interventions: Intervention Not Indicated Financial Strain Interventions: Intervention Not Indicated Housing Interventions: Intervention Not Indicated Transportation Interventions: Intervention Not Indicated  Readmission Risk Interventions No flowsheet data found.

## 2020-11-03 NOTE — Progress Notes (Signed)
Occupational Therapy Treatment Patient Details Name: Jonathan [name] MRN: [id number] DOB: [birth year] Today's Date: 11/03/2020   History of present illness Pt is a 35 y.o. male admitted 10/28/20 with worsening SOB, abdominal pain, BLE edema. Workup for acute CHF exacerbation, AKI, severe hyperglycemia. Pt developed aflutter w/ RVR on 9/14. PMH includes HF, DM, HTN, OSA, obesity.   OT comments  Focus of session on educating in energy conservation strategies and use of BSC to raise toilet and as shower seat. Pt's room became full with other staff members during session, limiting session. Wife has been walking pt to sink for ADL and assisting him.    Recommendations for follow up therapy are one component of a multi-disciplinary discharge planning process, led by the attending physician.  Recommendations may be updated based on patient status, additional functional criteria and insurance authorization.    Follow Up Recommendations  No OT follow up    Equipment Recommendations  3 in 1 bedside commode (bariatric)    Recommendations for Other Services      Precautions / Restrictions Precautions Precautions: Fall Required Braces or Orthoses: Other Brace Other Brace: uses boot on R LE when having more pain from Charcot foot       Mobility Bed Mobility Overal bed mobility: Needs Assistance Bed Mobility: Supine to Sit     Supine to sit: Mod assist     General bed mobility comments: assist for LEs to EOB and to raise trunk    Transfers Overall transfer level: Needs assistance Equipment used: None Transfers: Sit to/from Stand Sit to Stand: Supervision              Balance Overall balance assessment: Needs assistance   Sitting balance-Leahy Scale: Good       Standing balance-Leahy Scale: Fair                             ADL either performed or assessed with clinical judgement   ADL Overall ADL's : Needs assistance/impaired                              Toileting- Clothing Manipulation and Hygiene: Minimal assistance;Sit to/from stand Toileting - Clothing Manipulation Details (indicate cue type and reason): wife assisted with use of urinal at EOB       General ADL Comments: Educated pt and wife in energy conservation strategies and provided written handout.     Vision       Perception     Praxis      Cognition Arousal/Alertness: Awake/alert Behavior During Therapy: WFL for tasks assessed/performed;Flat affect Overall Cognitive Status: Within Functional Limits for tasks assessed                                          Exercises     Shoulder Instructions       General Comments      Pertinent Vitals/ Pain       Pain Assessment: Faces Faces Pain Scale: Hurts little more Pain Location: Back, foot, abdomen Pain Descriptors / Indicators: Grimacing Pain Intervention(s): Monitored during session  Home Living  Prior Functioning/Environment              Frequency  Min 2X/week        Progress Toward Goals  OT Goals(current goals can now be found in the care plan section)  Progress towards OT goals: Progressing toward goals  Acute Rehab OT Goals Patient Stated Goal: to keep peace in his family OT Goal Formulation: With patient Time For Goal Achievement: 11/13/20 Potential to Achieve Goals: Good  Plan Discharge plan remains appropriate    Co-evaluation                 AM-PAC OT "6 Clicks" Daily Activity     Outcome Measure   Help from another person eating meals?: None Help from another person taking care of personal grooming?: A Little Help from another person toileting, which includes using toliet, bedpan, or urinal?: A Little Help from another person bathing (including washing, rinsing, drying)?: A Lot Help from another person to put on and taking off regular upper body clothing?: A Little Help from another person  to put on and taking off regular lower body clothing?: A Lot 6 Click Score: 17    End of Session    OT Visit Diagnosis: Pain;Unsteadiness on feet (R26.81);Other abnormalities of gait and mobility (R26.89)   Activity Tolerance Patient tolerated treatment well   Patient Left in bed;with family/visitor present   Nurse Communication          Time: 0175-1025 OT Time Calculation (min): 17 min  Charges: OT General Charges $OT Visit: 1 Visit OT Treatments $Self Care/Home Management : 8-22 mins  Martie Round, OTR/L Acute Rehabilitation Services Pager: [phone] Office: [phone]   Evern Bio 11/03/2020, 3:39 PM

## 2020-11-03 NOTE — Progress Notes (Signed)
Palliative Medicine Inpatient Follow Up Note  Reason for Consultation: Establishing goals of care   HPI/Patient Profile: Palliative Care consult requested for goals of care discussion in this 35 y.o. male  with past medical history of CHF, asthma, obesity, uncontrolled diabetes, EF 30-35% (11/2019), hypertension, and OSA. He was admitted on 10/28/2020 from home with complaints of abdominal pain and shortness of breath. Patient is being followed by the Heart Failure team. Started on empiric milrinone, amio gtt, and IV lasix. Echo now showing EF <20%.   Today's Discussion (11/03/2020):  *Please note that this is a verbal dictation therefore any spelling or grammatical errors are due to the "[place]" system interpretation.  Chart reviewed.  I met this morning with Mckade and his spouse, Felix Pacini. We reviewed in detail his heart failure in accordance with the NYHA classification system. We discussed that Aidynn is in the last stages of the disease process. We reviewed the medications that he is presently receiving to help diurese him. We discussed how much weight he is up and the improvements that he has gradually made since admission. I shared that unfortunately there are no additional advance therapy options for Carlin at this time. We reviewed his lack of compliance over the years much of which has gotten him to this situation.   I broached the topic of code status and hospice care. We reviewed that if Sunil has an acute event that resuscitation would be of poor value as the medical team cannot fix Yida's heart. We reviewed that often these efforts for patients with multiple co-morbidities are very taxing on their bodies with little to often times no benefit in terms of the bigger picture.   I reviewed that Crespin is getting closer to a point where hospice should be considered. I gently broached that hospice is a service for patients who have a life expectancy of < 6 months. It preserves  dignity and quality at the end phases of life. The focus changes from curative to symptom relief.   Ramadan shares understanding that his present health situation is very poor. He expresses knowledge of this. He also shared that his diabetes management is very poor.   Felix Pacini expresses that she has seen patients improve despite what families are told. She also explains that a DNR would possibly inhibit Qusay's overall care. I shared with her that this should not be the case ever, but certainly her worry is not unfounded.   We determined that Zenia Resides and Felix Pacini would like to proceed with the present modality of care to identify if further improvements could be made. They share that Dr. Haroldine Laws is very straight forward and from what they understand he will help them decide when it's time to pursue hospice.   In the meanwhile, Burnice is willing to speak to a Palliative care to hospice agency to have this information should it be needed in the near or distant future.   I asked that Rynell complete his advance directives and take a look at the MOST form in the meanwhile.   Questions and concerns addressed / Palliative support provided  Objective Assessment: Vital Signs Vitals:   11/03/20 0109 11/03/20 0407  BP: (!) 137/101 (!) 127/95  Pulse: (!) 109 (!) 50  Resp: 20 20  Temp: 97.6 F (36.4 C) (!) 97.5 F (36.4 C)  SpO2: 97% 97%    Intake/Output Summary (Last 24 hours) at 11/03/2020 1144 Last data filed at 11/03/2020 0300 Gross per 24 hour  Intake 2298.16  ml  Output 4650 ml  Net -2351.84 ml    Last Weight  Most recent update: 11/03/2020  4:56 AM    Weight  172.5 kg (380 lb 6.4 oz)              Gen:  NAD, morbidly obese HEENT: moist mucous membranes CV: Tachycardic PULM: clear to auscultation bilaterally. No wheezes/rales/rhonchi ABD: soft/nontender/nondistended/normal bowel sounds EXT: Bilateral lower extremity edema, leg wraps Neuro: Alert and oriented x3, mood  appropriate  SUMMARY OF RECOMMENDATIONS   - Full code --> Strong recommendations to consider DNR/DNI  - Appreciate Chaplain helping with advance directives  - TOC will refer to [facility] for informational purposes as they are able to provide additional gtt therapies such as milrinone should it be needed -PMT will continue to support and follow along during hospitalization  Time Spent: 60 Greater than 50% of the time was spent in counseling and coordination of care ______________________________________________________________________________________ [place] Team Team Cell Phone: [phone] Please utilize secure chat with additional questions, if there is no response within 30 minutes please call the above phone number  Palliative Medicine Team providers are available by phone from 7am to 7pm daily and can be reached through the team cell phone.  Should this patient require assistance outside of these hours, please call the patient's attending physician.

## 2020-11-03 NOTE — Progress Notes (Signed)
   11/03/20 1422  Mobility  Activity Refused mobility (Unspecified reasons)

## 2020-11-03 NOTE — Progress Notes (Signed)
ANTICOAGULATION CONSULT NOTE  Pharmacy Consult for heparin  Indication: atrial fibrillation  Allergies  Allergen Reactions   Hydrocodone     Hives      Patient Measurements: Height: 6\' 2"  (188 cm) Weight: (!) 172.5 kg (380 lb 6.4 oz) IBW/kg (Calculated) : 82.2 Heparin Dosing Weight:  127.7kg  Vital Signs: Temp: 97.5 F (36.4 C) (09/19 0407) Temp Source: Oral (09/19 0407) BP: 127/95 (09/19 0407) Pulse Rate: 50 (09/19 0407)  Labs: Recent Labs    11/01/20 0300 11/01/20 0832 11/01/20 2225 11/02/20 0500 11/02/20 0650 11/03/20 0543 11/03/20 1207  HGB 14.4  --   --  14.8  --  15.7  --   HCT 42.4  --   --  44.9  --  47.1  --   PLT 231  --   --  232  --  212  --   APTT 65* 67*  --   --   --   --   --   HEPARINUNFRC  --   --  0.44  --  0.34  --  0.50  CREATININE 1.81*  --   --  1.82*  --  1.83*  --      Estimated Creatinine Clearance: 94.3 mL/min (A) (by C-G formula based on SCr of 1.83 mg/dL (H)).   Assessment: 35 yo M with hx of HFrEF and new atrial flutter. He has been transitioned from bivalirudin back to heparin. Heparin level 0.5 at goal on 2250 units/hr. No bleeding noted. CBC stable   Goal of Therapy:  Heparin level 0.3-0.7 Monitor platelets by anticoagulation protocol: Yes   Plan:  Continue heparin at 2250 units/hr Monitor daily heparin level and CBC      31 Pharm.D. CPP, BCPS Clinical Pharmacist [phone] 11/03/2020 3:46 PM   Check [email or web address] for unit specific pharmacy number

## 2020-11-03 NOTE — Progress Notes (Signed)
HD#6 SUBJECTIVE:  Patient Summary: Jonathan [name] is a 35 y.o. with a pertinent PMH of HFrEF, uncontrolled insulin-naive type 2 diabetes, hypertension, OSA, who presented with shortness of breath and abdominal pain and admitted for heart failure exacerbation complicated by uncontrolled type 2 diabetes mellitus.   Overnight Events: None  Interim History: Patient states that he has not really having trouble with abdominal pain or breathing.  He was able to start laying flat to sleep at night which is an improvement from admission.  He does describe his mental health suffering as a result of being hospitalized.  He tells [place] about how he was in the hospital for a week with his father who ultimately died and is feeling like he is not doing the same thing.  He also says that when he goes to sleep at night he feels like something is over him, like a pressure that is over his whole body.  OBJECTIVE:  Vital Signs: Vitals:   11/02/20 1630 11/02/20 2114 11/03/20 0109 11/03/20 0407  BP: (!) 140/100 (!) 147/104 (!) 137/101 (!) 127/95  Pulse:  (!) 111 (!) 109 (!) 50  Resp: 20 20 20 20   Temp:  98.1 F (36.7 C) 97.6 F (36.4 C) (!) 97.5 F (36.4 C)  TempSrc:  Oral Oral Oral  SpO2: 95% 96% 97% 97%  Weight:    (!) 172.5 kg  Height:       Supplemental O2: Room Air SpO2: 97 %  Filed Weights   11/01/20 0500 11/02/20 0150 11/03/20 0407  Weight: (!) 181.2 kg (!) 177.1 kg (!) 172.5 kg     Intake/Output Summary (Last 24 hours) at 11/03/2020 1237 Last data filed at 11/03/2020 0300 Gross per 24 hour  Intake 2298.16 ml  Output 3875 ml  Net -1576.84 ml   Net IO Since Admission: -16,150.09 mL [11/03/20 1237]  Physical Exam: Constitutional: Patient is sitting in a chair at bedside.  No acute distress noted. Cardio: Tachycardia with regular rhythm.  No murmurs, rubs, gallops. Pulm: Clear to auscultation bilaterally. Abdomen: Soft, nontender, nondistended.  There is a scabbed area over left lower  quadrant but there is no erythema or increased warmth. MSK: 2+ pitting edema bilaterally to mid thigh. Skin: Skin is cool to touch. Neuro: Alert and oriented x3.  No focal deficit noted. Psych: Normal mood and affect.  Patient Lines/Drains/Airways Status     Active Line/Drains/Airways     Name Placement date Placement time Site Days   Peripheral IV 10/28/20 20 G Right Antecubital 10/28/20  0746  Antecubital  6   Peripheral IV 10/31/20 22 G 1.75" Left Forearm 10/31/20  1712  Forearm  3   PICC Double Lumen 10/28/20 PICC Right Cephalic 39 cm 0 cm 10/28/20  10/30/20  -- 6             ASSESSMENT/PLAN:  Assessment: Active Problems:   Diabetes mellitus type 2 in obese (HCC)   Essential hypertension   OSA (obstructive sleep apnea)   Acute on chronic heart failure (HCC)   Goals of care, counseling/discussion   Plan: #Acute on chronic biventricular, low output failure Patient continues to have evidence of acute exacerbation of biventricular, low output heart failure with 2+ pitting edema to the mid thigh and a persistence of extremities that are cool to the touch with co-ox testing supporting poor perfusion: Total hemoglobin 14.2, carboxyhemoglobin 1.3, methemoglobin 0.6, O2 saturation 52.3.  These are minimally changed.  AKI is persistent. -Advanced heart failure team consulted, appreciate their  recommendations             -Continue Lasix 30 mg/h with acetazolamide 250 mg BID. Trend bicarb.             -Continue milrinone 13.95 mL/h             -Continue digoxin 0.125 mg daily             -Continue amiodarone 30 mg/h             -Continue spironolactone 25 mg daily             -Avoid beta-blockers due to low output             -Continue IV heparin with daily heparin levels and CBC trending             -K goal greater than 4, Mg goal greater than 2                         -Give an extra dose of K this morning as level was 3.3.             -Consider adding back Entresto this  admission -Trend BMP -CVP monitoring -Palliative care consulted, appreciate their assistance  #Aflutter In the setting of milrinone. Patient currently in sinus tachycardia.  -Continue amiodarone, digoxin, and heparin infusion    #Uncontrolled type 2 diabetes mellitus Patient continues to have persistently elevated blood sugars during admission.  -Continue scheduled insulin: 10 units 3 times daily in addition to sliding scale insulin at mealtime. -Increased Semglee to 35 units -CBG monitoring   #Cardiorenal syndrome Patient with AKI likely due to to low output heart failure.  BUN/creatinine of 25/1.83.  It seems that his baseline creatinine is 0.93. Urine output 9/18 was 6.4 L, his weight is decreased by 10 pounds yesterday.  -Nephrology consulted, appreciate their recommendations -Trend BMP -Avoid nephrotoxic agents -Strict I's/O's -Continue IV Lasix and milrinone as above.   #HTN #HLD Historically treated for hypertension with carvedilol 25 mg twice daily, Entresto 97-103 mg twice daily, spironolactone 25 mg daily. -Continue spironolactone 25 mg daily -Consider restarting Entresto during this admission   #OSA -BiPAP   #Hypervolemic hyponatremia Hyponatremia persists though is stable with morning labs showing Na of 130.  This is likely secondary to volume overload from acute on chronic heart failure exacerbation.  We will continue to diurese. -Trend BMP -Restrict free water  Best Practice: Diet: Cardiac diet IVF: Fluids: None VTE:   Heparin gtt Code: Full AB: None Therapy Recs: Home Health, DME: other 3 in 1 DISPO: Continue to monitor inpatient.  Signature: Champ Mungo, D.O.  Internal Medicine Resident, PGY-1 [facility]  Pager: [phone] 12:37 PM, 11/03/2020   Please contact the on call pager after 5 pm and on weekends at [phone].

## 2020-11-03 NOTE — Progress Notes (Addendum)
Patient ID: Jayson Waterhouse, male   DOB: 11-23-1985, 35 y.o.   MRN: [id number]     Advanced Heart Failure Rounding Note  PCP-Cardiologist: Armanda Magic, MD  HF MD: Dr. Gala Romney   Patient Profile   Mr. Sermon is a 35 year old male with history of HFrEF, NICM, HTN, asthma, OSA, morbid obesity, and uncontrolled DM. Hx of medical noncompliance. Admitted on 09/13 with acute on chronic systolic HF, severe hyperglycemia, AKI and abdominal pain.    Subjective:    09/13: Started on Empiric milrinone 0.25, lasix gtt at 10/hr 09/14: Developed AFL w/ RVR>>amio gtt started   On 0.25 milrinone. Co-ox 52%  Remains on lasix gtt at 30 mg/hr and acetazolamide 250 mg bid.  Brisk diuresis noted. Weight down another 10 pounds.   Frustrated wants to know when he can go home.   Cardiac Studies   Echo 09/22: EF < 20%, RV fxn moderately reduced, trivial MR, mild TR Echo 10/21: EF 30-35% with moderate RV dysfunction in setting of recurrent RBBB with significant dyssynchrony.  ECHO 04/2019: EF 30-35%  ECHO 2019: EF 55%   Echo EF 15-20% in [place] in 2017. Cath around that time without any significant CAD per patient.  Objective:   Weight Range: (!) 172.5 kg Body mass index is 48.84 kg/m.   Vital Signs:   Temp:  [97.5 F (36.4 C)-98.5 F (36.9 C)] 97.5 F (36.4 C) (09/19 0407) Pulse Rate:  [50-111] 50 (09/19 0407) Resp:  [19-20] 20 (09/19 0407) BP: (126-147)/(95-106) 127/95 (09/19 0407) SpO2:  [95 %-97 %] 97 % (09/19 0407) Weight:  [172.5 kg] 172.5 kg (09/19 0407) Last BM Date: 11/01/20  Weight change: Filed Weights   11/01/20 0500 11/02/20 0150 11/03/20 0407  Weight: (!) 181.2 kg (!) 177.1 kg (!) 172.5 kg    Intake/Output:   Intake/Output Summary (Last 24 hours) at 11/03/2020 0745 Last data filed at 11/03/2020 0300 Gross per 24 hour  Intake 2538.16 ml  Output 5775 ml  Net -3236.84 ml      Physical Exam  CVP 13-14  General:   No resp difficulty HEENT: normal Neck: supple. JVP 11-12 .  Carotids 2+ bilat; no bruits. No lymphadenopathy or thryomegaly appreciated. Cor: PMI nondisplaced. Tachy Regular rate & rhythm. No rubs, gallops or murmurs. Lungs: clear Abdomen: soft, nontender, nondistended. No hepatosplenomegaly. No bruits or masses. Good bowel sounds. Extremities: no cyanosis, clubbing, rash, R and LLE 2+ edema. RUE PICC  Neuro: alert & orientedx3, cranial nerves grossly intact. moves all 4 extremities w/o difficulty. Affect pleasant  Telemetry   Sinus Tach 100-110 with NSVT.   Labs    CBC Recent Labs    11/02/20 0500 11/03/20 0543  WBC 6.7 5.9  HGB 14.8 15.7  HCT 44.9 47.1  MCV 90.9 90.8  PLT 232 212   Basic Metabolic Panel Recent Labs    74/94/49 0500 11/03/20 0543  NA 129* 130*  K 3.3* 3.3*  CL 83* 86*  CO2 26 31  GLUCOSE 244* 190*  BUN 23* 25*  CREATININE 1.82* 1.83*  CALCIUM 9.7 9.7  MG 2.0 2.0   Liver Function Tests No results for input(s): AST, ALT, ALKPHOS, BILITOT, PROT, ALBUMIN in the last 72 hours.  No results for input(s): LIPASE, AMYLASE in the last 72 hours. Cardiac Enzymes No results for input(s): CKTOTAL, CKMB, CKMBINDEX, TROPONINI in the last 72 hours.  BNP: BNP (last 3 results) Recent Labs    05/01/20 1020 09/11/20 0951 10/28/20 1315  BNP 862.1* 803.3* 1,547.7*  ProBNP (last 3 results) No results for input(s): PROBNP in the last 8760 hours.   D-Dimer No results for input(s): DDIMER in the last 72 hours. Hemoglobin A1C No results for input(s): HGBA1C in the last 72 hours. Fasting Lipid Panel No results for input(s): CHOL, HDL, LDLCALC, TRIG, CHOLHDL, LDLDIRECT in the last 72 hours. Thyroid Function Tests No results for input(s): TSH, T4TOTAL, T3FREE, THYROIDAB in the last 72 hours.  Invalid input(s): FREET3  Other results:   Imaging    No results found.   Medications:     Scheduled Medications:  acetaZOLAMIDE  250 mg Oral BID   Chlorhexidine Gluconate Cloth  6 each Topical Daily   digoxin   0.125 mg Oral Daily   insulin aspart  0-15 Units Subcutaneous TID WC   insulin aspart  10 Units Subcutaneous TID WC   insulin glargine-yfgn  35 Units Subcutaneous Daily   loratadine  10 mg Oral Daily   melatonin  3 mg Oral QHS   potassium chloride  40 mEq Oral Q8H   sodium chloride flush  10-40 mL Intracatheter Q12H   sodium chloride flush  3 mL Intravenous Q12H   spironolactone  25 mg Oral Daily    Infusions:  amiodarone 30 mg/hr (11/02/20 1931)   furosemide (LASIX) 200 mg in dextrose 5% 100 mL (2mg /mL) infusion 30 mg/hr (11/02/20 2315)   heparin 2,250 Units/hr (11/03/20 0057)   milrinone 0.25 mcg/kg/min (11/03/20 0055)   sodium chloride      PRN Medications: acetaminophen **OR** acetaminophen, alum & mag hydroxide-simeth, sodium chloride flush  Assessment/Plan   A/C HFrEF, NICM  -Most recent ECHO back in 2021 EF 30-35 % with moderate RV dysfunction in the setting of recurrent RBBB with significant dyssynchrony. Echo this admit with EF < 20% and moderate RV dysfunction - Marked volume overload w/ low output. On 0.25 milrinone. Co-ox 52% . Continue while diuresing then will start to wean.  - Brisk diuresis noted. CVP down 13-14.  -Needs compression on lower extremities however he is intolerant unna boots and ted hose.  - Continue Lasix 30 mg/hr and acetazolamide 250 mg bid.  Watch HCO3 (31 today)  - Intolerant SGLT2i due to candidiasis +uncontrolled DM - Continue spiro 25 mg daily - Continue digoxin 0.125.  - Hopefully can add back Entresto soon.  -  Not candidate for VAD or transplant currently due to size and Hgba1c > 14     2. Uncontrolled DM -9/1 Hgb A1C > 14.  -Per primary team.    3. AKI -Baseline creatinine < 1.  - Creatinine has stable at 1.8  for the last 3 days.  - 2/2 low output heart failure/ cardiorenal .  - continue diuresis + inotrope supp    4. Atrial Flutter  - c/w amio gtt while on milrinone -Maintaining SR. Continue IV amio while on milrinone -  Heparin gtt - Supp K   5. Hypervolemic Hyponatremia  - Na 125 >127>126>130>129>130    7. OSA: Severe OSA AHI 65 -  Bipap QHS   8. Morbid Obesity  Body mass index is 48.84 kg/m.  9. H/O medical noncompliance -Reports taking HF medications prior to admit. Missed last few clinic f/u and discharged from HF paramedicine d/t noncompliance.  -Consider HF paramedicine at discharge if willing  10. Hypokalemia/ Hypomagnesemia - Replace K today.   11. NSVT  - Watch K and Mag.  Keep K 4 and Mag 2 Supp K today.  Mag 2  12. GOC Palliative Care following.for  GOC.      Length of Stay: 6  Amy Clegg, NP  11/03/2020, 7:45 AM  Advanced Heart Failure Team Pager [phone] (M-F; 7a - 5p)  Please contact [facility] for night-coverage after hours (5p -7a ) and weekends on [email or web address]   Patient seen and examined with the above-signed Advanced Practice Provider and/or Housestaff. I personally reviewed laboratory data, imaging studies and relevant notes. I independently examined the patient and formulated the important aspects of the plan. I have edited the note to reflect any of my changes or salient points. I have personally discussed the plan with the patient and/or family.  Remains on milrinone. Co-ox marginal. Diuresis finally starting to pick up. Breathing better. Orthopnea finally resolved. Rhythm stable on IV amio   General:  Sitting up on side of bed No resp difficulty HEENT: normal Neck: supple. CVP 12-13 Carotids 2+ bilat; no bruits. No lymphadenopathy or thryomegaly appreciated. Cor: PMI nondisplaced. Regular tachy + s3 Lungs: clear Abdomen: obese soft, nontender, nondistended. No hepatosplenomegaly. No bruits or masses. Good bowel sounds. Extremities: no cyanosis, clubbing, rash, 3+  edema Neuro: alert & orientedx3, cranial nerves grossly intact. moves all 4 extremities w/o difficulty. Affect pleasant  Volume status improving but still with at least 20 pounds of volume on  board. Co-ox marginal. Continue IV diuresis. Encourage compression hose. Supp K.   Will continue to diuresis further prior to weaning milrinone.   Arvilla Meres, MD  11:03 AM

## 2020-11-03 NOTE — Progress Notes (Signed)
Physical Therapy Treatment Patient Details Name: Jonathan [name] MRN: [id number] DOB: August 31, 1985 Today's Date: 11/03/2020   History of Present Illness Pt is a 35 y.o. male admitted 10/28/20 with worsening SOB, abdominal pain, BLE edema. Workup for acute CHF exacerbation, AKI, severe hyperglycemia. Pt developed aflutter w/ RVR on 9/14. PMH includes HF, DM, HTN, OSA, obesity.   PT Comments    Pt progressing with mobility. Today's session focused on transfer and gait training, pt requiring intermittent min guard for balance; distance limited by pain and fatigue. Pt remains limited by generalized weakness, decreased activity tolerance, pain and impaired balance strategies/postural reactions. Will continue to follow acutely to address established goals.  HR up to 140s with activity    Recommendations for follow up therapy are one component of a multi-disciplinary discharge planning process, led by the attending physician.  Recommendations may be updated based on patient status, additional functional criteria and insurance authorization.  Follow Up Recommendations  Home health PT;Supervision for mobility/OOB     Equipment Recommendations   (3in1 delivered)    Recommendations for Other Services       Precautions / Restrictions Precautions Precautions: Fall Required Braces or Orthoses: Other Brace Other Brace: uses boot on R LE when having more pain from Charcot foot Restrictions Weight Bearing Restrictions: No     Mobility  Bed Mobility               General bed mobility comments: Received sitting EOB    Transfers Overall transfer level: Needs assistance Equipment used: None Transfers: Sit to/from Stand Sit to Stand: Supervision         General transfer comment: Increased time and effort, supervision for safety/lines standing from EOB and chair in front of sink; reliant on momentum to power up  Ambulation/Gait Ambulation/Gait assistance: Min guard Gait Distance (Feet):  32 Feet Assistive device: None Gait Pattern/deviations: Step-to pattern;Antalgic;Wide base of support Gait velocity: Decreased   General Gait Details: Slow, antalgic, mildly unsteady gait without DME, min guard for balance; ambulating 6' + 26' with 1x seated rest break at sink; pt limited by fatigue and pain, but declining use of RW to offload painful foot (states, "I already feel helpless enough as it is")   Social research officer, government Jonathan [name] (Stroke Patients Only)       Balance Overall balance assessment: Needs assistance   Sitting balance-Leahy Scale: Good     Standing balance support: No upper extremity supported Standing balance-Leahy Scale: Fair Standing balance comment: Can void at toilet wtihout UE support, ambulatory without UE support; likely unable to accept significant challenge                            Cognition Arousal/Alertness: Awake/alert Behavior During Therapy: WFL for tasks assessed/performed;Flat affect Overall Cognitive Status: Within Functional Limits for tasks assessed                                        Exercises      General Comments General comments (skin integrity, edema, etc.): Noted HR up to 140s with ambulation      Pertinent Vitals/Pain Pain Assessment: Faces Faces Pain Scale: Hurts little more Pain Location: Back, foot, abdomen Pain Descriptors / Indicators: Sore Pain Intervention(s): Monitored during session;Limited activity within patient's tolerance  Home Living                      Prior Function            PT Goals (current goals can now be found in the care plan section) Progress towards PT goals: Progressing toward goals    Frequency    Min 3X/week      PT Plan Current plan remains appropriate    Co-evaluation              AM-PAC PT "6 Clicks" Mobility   Outcome Measure  Help needed turning from your back to your side while in a  flat bed without using bedrails?: None Help needed moving from lying on your back to sitting on the side of a flat bed without using bedrails?: None Help needed moving to and from a bed to a chair (including a wheelchair)?: A Little Help needed standing up from a chair using your arms (e.g., wheelchair or bedside chair)?: A Little Help needed to walk in hospital room?: A Little Help needed climbing 3-5 steps with a railing? : A Lot 6 Click Score: 19    End of Session   Activity Tolerance: Patient tolerated treatment well;Patient limited by fatigue Patient left: in chair;with call bell/phone within reach;with family/visitor present Nurse Communication: Mobility status PT Visit Diagnosis: Unsteadiness on feet (R26.81);Other abnormalities of gait and mobility (R26.89);Muscle weakness (generalized) (M62.81);History of falling (Z91.81);Difficulty in walking, not elsewhere classified (R26.2);Dizziness and giddiness (R42);Other symptoms and signs involving the nervous system (R29.898);Pain     Time: 4656-8127 PT Time Calculation (min) (ACUTE ONLY): 23 min  Charges:  $Gait Training: 8-22 mins $Therapeutic Activity: 8-22 mins                     Jonathan [name], PT, DPT Acute Rehabilitation Services  Pager [phone] Office [phone]  Jonathan [name] 11/03/2020, 10:00 AM

## 2020-11-04 DIAGNOSIS — E1169 Type 2 diabetes mellitus with other specified complication: Secondary | ICD-10-CM | POA: Diagnosis not present

## 2020-11-04 DIAGNOSIS — I10 Essential (primary) hypertension: Secondary | ICD-10-CM | POA: Diagnosis not present

## 2020-11-04 DIAGNOSIS — I509 Heart failure, unspecified: Secondary | ICD-10-CM | POA: Diagnosis not present

## 2020-11-04 DIAGNOSIS — Z515 Encounter for palliative care: Secondary | ICD-10-CM | POA: Diagnosis not present

## 2020-11-04 DIAGNOSIS — I5023 Acute on chronic systolic (congestive) heart failure: Secondary | ICD-10-CM | POA: Diagnosis not present

## 2020-11-04 DIAGNOSIS — E669 Obesity, unspecified: Secondary | ICD-10-CM | POA: Diagnosis not present

## 2020-11-04 DIAGNOSIS — Z7189 Other specified counseling: Secondary | ICD-10-CM | POA: Diagnosis not present

## 2020-11-04 LAB — BASIC METABOLIC PANEL
Anion gap: 14 (ref 5–15)
BUN: 26 mg/dL — ABNORMAL HIGH (ref 6–20)
CO2: 29 mmol/L (ref 22–32)
Calcium: 9.4 mg/dL (ref 8.9–10.3)
Chloride: 85 mmol/L — ABNORMAL LOW (ref 98–111)
Creatinine, Ser: 1.96 mg/dL — ABNORMAL HIGH (ref 0.61–1.24)
GFR, Estimated: 45 mL/min — ABNORMAL LOW (ref >60)
Glucose, Bld: 255 mg/dL — ABNORMAL HIGH (ref 70–99)
Potassium: 3.9 mmol/L (ref 3.5–5.1)
Sodium: 128 mmol/L — ABNORMAL LOW (ref 135–145)

## 2020-11-04 LAB — GLUCOSE, CAPILLARY
Glucose-Capillary: 258 mg/dL — ABNORMAL HIGH (ref 70–99)
Glucose-Capillary: 229 mg/dL — ABNORMAL HIGH (ref 70–99)
Glucose-Capillary: 216 mg/dL — ABNORMAL HIGH (ref 70–99)
Glucose-Capillary: 209 mg/dL — ABNORMAL HIGH (ref 70–99)

## 2020-11-04 LAB — CBC
HCT: 43.6 % (ref 39.0–52.0)
Hemoglobin: 14.4 g/dL (ref 13.0–17.0)
MCH: 29.9 pg (ref 26.0–34.0)
MCHC: 33 g/dL (ref 30.0–36.0)
MCV: 90.6 fL (ref 80.0–100.0)
Platelets: 214 10*3/uL (ref 150–400)
RBC: 4.81 MIL/uL (ref 4.22–5.81)
RDW: 14 % (ref 11.5–15.5)
WBC: 6.9 10*3/uL (ref 4.0–10.5)
nRBC: 0 % (ref 0.0–0.2)

## 2020-11-04 LAB — COOXEMETRY PANEL
Carboxyhemoglobin: 1.5 % (ref 0.5–1.5)
Carboxyhemoglobin: 1.3 % (ref 0.5–1.5)
Methemoglobin: 0.7 % (ref 0.0–1.5)
Methemoglobin: 0.6 % (ref 0.0–1.5)
O2 Saturation: 48.4 %
O2 Saturation: 56 %
Total hemoglobin: 14.7 g/dL (ref 12.0–16.0)
Total hemoglobin: 14.7 g/dL (ref 12.0–16.0)

## 2020-11-04 LAB — HEPARIN LEVEL (UNFRACTIONATED): Heparin Unfractionated: 0.51 IU/mL (ref 0.30–0.70)

## 2020-11-04 LAB — MAGNESIUM: Magnesium: 2 mg/dL (ref 1.7–2.4)

## 2020-11-04 MED ORDER — INSULIN GLARGINE-YFGN 100 UNIT/ML ~~LOC~~ SOLN
25.0000 [IU] | Freq: Two times a day (BID) | SUBCUTANEOUS | Status: DC
  Administered 2020-11-04 – 2020-11-07 (×6): 25 [IU] via SUBCUTANEOUS
  Filled 2020-11-04 (×7): qty 0.25

## 2020-11-04 MED ORDER — INSULIN ASPART 100 UNIT/ML IJ SOLN
12.0000 [IU] | Freq: Three times a day (TID) | INTRAMUSCULAR | Status: DC
  Administered 2020-11-04 – 2020-11-05 (×2): 12 [IU] via SUBCUTANEOUS

## 2020-11-04 NOTE — Progress Notes (Signed)
HD#7 SUBJECTIVE:  Patient Summary: Jonathan [name] is a 35 y.o. with a pertinent PMH of HFrEF, uncontrolled insulin-naive type 2 diabetes, hypertension, OSA, who presented with shortness of breath and abdominal pain and admitted for heart failure exacerbation complicated by uncontrolled type 2 diabetes mellitus.   Overnight Events: None  Interim History: Patient endorses no change from how he was feeling yesterday.  He states that he still feels that his mental health is being negatively impacted by being hospitalized.  He states that he is getting tired of getting stuck for glucose checks and insulin delivery and is interested in obtaining a libre device.  He is also requesting that we allow him to have an evening snack.  OBJECTIVE:  Vital Signs: Vitals:   11/03/20 2332 11/04/20 0406 11/04/20 0750 11/04/20 1103  BP: 132/76 (!) 137/104 (!) 138/91 (!) 133/94  Pulse: (!) 110 (!) 115 (!) 110 (!) 111  Resp: 20 (!) 22 19 15   Temp: 99.2 F (37.3 C) 97.8 F (36.6 C) 97.8 F (36.6 C) 98.2 F (36.8 C)  TempSrc: Oral Oral Oral Oral  SpO2: 97% 98% 98% 98%  Weight:  (!) 168.8 kg    Height:       Supplemental O2: Room Air SpO2: 98 %  Filed Weights   11/02/20 0150 11/03/20 0407 11/04/20 0406  Weight: (!) 177.1 kg (!) 172.5 kg (!) 168.8 kg     Intake/Output Summary (Last 24 hours) at 11/04/2020 1304 Last data filed at 11/04/2020 1106 Gross per 24 hour  Intake 2859.76 ml  Output 2700 ml  Net 159.76 ml   Net IO Since Admission: -16,790.33 mL [11/04/20 1304]  Physical Exam: Constitutional: Patient is sitting on the side of the bed, speaking with outpatient hospice representative.  No acute distress noted. Cardio: Tachycardic with regular rhythm.  No murmurs, rubs, gallops. Pulm: Clear to auscultation bilaterally.  Normal work of breathing. Abdomen: Soft, nontender, nondistended. MSK: 2+ pitting edema bilaterally to mid thigh. Skin: Skin is cool to touch. Neuro: Alert and oriented x3.   No focal deficit noted. Psych: Pleasant mood and affect today.  Patient Lines/Drains/Airways Status     Active Line/Drains/Airways     Name Placement date Placement time Site Days   Peripheral IV 10/28/20 20 G Right Antecubital 10/28/20  0746  Antecubital  7   Peripheral IV 10/31/20 22 G 1.75" Left Forearm 10/31/20  1712  Forearm  4   PICC Double Lumen 10/28/20 PICC Right Cephalic 39 cm 0 cm 10/28/20  10/30/20  -- 7             ASSESSMENT/PLAN:  Assessment: Active Problems:   Diabetes mellitus type 2 in obese (HCC)   Essential hypertension   OSA (obstructive sleep apnea)   Acute on chronic heart failure (HCC)   Goals of care, counseling/discussion   Diabetes mellitus (HCC)   Plan: #Acute on chronic biventricular, low output failure Patient continues to have evidence of acute exacerbation of biventricular, low output heart failure with 2+ pitting edema to the mid thigh and a persistence of extremities that are cool to the touch with co-ox testing supporting poor perfusion: Total hemoglobin 14.7, carboxyhemoglobin 1.5, methemoglobin 0.7, O2 saturation 48.4.   -Advanced heart failure team consulted, appreciate their recommendations             -Continue Lasix 30 mg/h with acetazolamide 250 mg BID. Trend bicarb.             -Continue milrinone 13.95 mL/h             -  Continue digoxin 0.125 mg daily             -Continue amiodarone 30 mg/h             -Continue spironolactone 25 mg daily             -Avoid beta-blockers due to low output             -Continue IV heparin with daily heparin levels and CBC trending             -K goal greater than 4, Mg goal greater than 2.  Replete as necessary. -Trend BMP -CVP monitoring.  This was 16-17 today. -Palliative care consulted, appreciate their assistance   #Aflutter In the setting of milrinone. Patient currently in sinus tachycardia.  -Continue amiodarone, digoxin, and heparin infusion    #Uncontrolled type 2 diabetes  mellitus Patient continues to have persistently elevated blood sugars during admission.  -Increase scheduled insulin: 12 units 3 times daily in addition to sliding scale insulin at mealtime. -Increased Semglee to 50 units, split into 2 doses of 25 units, 1 in the morning and 1 in the afternoon -CBG monitoring   #Cardiorenal syndrome Patient with AKI likely due to to low output heart failure.  BUN/creatinine of 26/1.96.  It seems that his baseline creatinine is 0.93. Urine output 9/19 was 2.9 L, his weight is decreased by 3.2 pounds yesterday.  -Nephrology consulted, appreciate their recommendations -Trend BMP -Avoid nephrotoxic agents -Strict I's/O's -Continue IV Lasix and milrinone as above.   #HTN #HLD Historically treated for hypertension with carvedilol 25 mg twice daily, Entresto 97-103 mg twice daily, spironolactone 25 mg daily. -Continue spironolactone 25 mg daily -Consider restarting Entresto during this admission   #OSA -BiPAP   #Hypervolemic hyponatremia Hyponatremia persists though is stable with morning labs showing Na of 128.  This is likely secondary to volume overload from acute on chronic heart failure exacerbation.  We will continue to diurese. -Trend BMP -Restrict free water  Best Practice: Diet: Cardiac diet IVF: Fluids: None VTE: Heparin gtt Code: Full AB: None Therapy Recs: Home Health, DME: other 3 in 1 DISPO: Continue to monitor inpatient.  Signature: Champ Mungo, D.O.  Internal Medicine Resident, PGY-1 [facility]  Pager: [phone] 1:04 PM, 11/04/2020   Please contact the on call pager after 5 pm and on weekends at [phone].

## 2020-11-04 NOTE — Progress Notes (Signed)
Pt stated he will not be using cpap tonight.

## 2020-11-04 NOTE — Progress Notes (Signed)
Physical Therapy Treatment Patient Details Name: Jonathan [name] MRN: [id number] DOB: 12-17-1985 Today's Date: 11/04/2020   History of Present Illness Pt is a 35 y.o. male admitted 10/28/20 with worsening SOB, abdominal pain, BLE edema. Workup for acute CHF exacerbation, AKI, severe hyperglycemia. Pt developed aflutter w/ RVR on 9/14. PMH includes HF, DM, HTN, OSA, obesity.   PT Comments    Pt progressing with mobility. Today's session focused on transfer and gait training with bari rolling walker; pt's stability and activity tolerance improving with this. Pt remains limited by decreased activity tolerance and impaired balance strategies/postural reactions. Will continue to follow acutely to address established goals.    Recommendations for follow up therapy are one component of a multi-disciplinary discharge planning process, led by the attending physician.  Recommendations may be updated based on patient status, additional functional criteria and insurance authorization.  Follow Up Recommendations  Home health PT;Supervision for mobility/OOB     Equipment Recommendations  Bariatric-sized rolling walker    Recommendations for Other Services       Precautions / Restrictions Precautions Precautions: Fall Restrictions Weight Bearing Restrictions: No     Mobility  Bed Mobility               General bed mobility comments: Received sitting EOB; noted pt's wife assisting him to EOB with HHA    Transfers Overall transfer level: Needs assistance Equipment used: None;Rolling walker (2 wheeled) Transfers: Sit to/from Stand Sit to Stand: Modified independent (Device/Increase time)            Ambulation/Gait Ambulation/Gait assistance: Min guard;Supervision Gait Distance (Feet): 148 Feet Assistive device: Rolling walker (2 wheeled) Gait Pattern/deviations: Step-through pattern;Decreased stride length;Trunk flexed;Wide base of support Gait velocity: Decreased   General Gait  Details: Pt reluctant, but ultimately agreeable to perform gait training with bariatric rolling walker; stability much improved, as well as activity tolerance, intermittent min guard for balance with various standing rest breaks to stretch neck/back   Stairs             Wheelchair Mobility    Modified Rankin (Stroke Patients Only)       Balance Overall balance assessment: Needs assistance Sitting-balance support: No upper extremity supported Sitting balance-Leahy Scale: Good     Standing balance support: No upper extremity supported Standing balance-Leahy Scale: Fair Standing balance comment: ambulatory without UE support, guarded; likely unable to accept significant challenge                            Cognition Arousal/Alertness: Awake/alert Behavior During Therapy: WFL for tasks assessed/performed;Flat affect Overall Cognitive Status: Within Functional Limits for tasks assessed                                        Exercises Other Exercises Other Exercises: [employer] HEP handout (Access Code [id number]) provided & practiced - stretches for cervical flexion, lateral flexion, levator scap    General Comments General comments (skin integrity, edema, etc.): Pt's wife present and supportive. Pt appreciative of using walker by end of session, would like one for home; wife in agreement      Pertinent Vitals/Pain Pain Assessment: Faces Faces Pain Scale: Hurts a little bit Pain Location: neck, upper traps Pain Descriptors / Indicators: Sore;Tightness Pain Intervention(s): Monitored during session;Other (comment) (pt plans to apply heat; cervical/trap stretches provided)    Home Living  Prior Function            PT Goals (current goals can now be found in the care plan section) Progress towards PT goals: Progressing toward goals    Frequency    Min 3X/week      PT Plan Equipment recommendations need  to be updated    Co-evaluation              AM-PAC PT "6 Clicks" Mobility   Outcome Measure  Help needed turning from your back to your side while in a flat bed without using bedrails?: None Help needed moving from lying on your back to sitting on the side of a flat bed without using bedrails?: None Help needed moving to and from a bed to a chair (including a wheelchair)?: A Little Help needed standing up from a chair using your arms (e.g., wheelchair or bedside chair)?: None Help needed to walk in hospital room?: A Little Help needed climbing 3-5 steps with a railing? : A Lot 6 Click Score: 20    End of Session   Activity Tolerance: Patient tolerated treatment well Patient left: in chair;with call bell/phone within reach;with family/visitor present Nurse Communication: Mobility status PT Visit Diagnosis: Unsteadiness on feet (R26.81);Other abnormalities of gait and mobility (R26.89);Muscle weakness (generalized) (M62.81);History of falling (Z91.81);Difficulty in walking, not elsewhere classified (R26.2);Dizziness and giddiness (R42);Other symptoms and signs involving the nervous system (R29.898);Pain     Time: 4403-4742 PT Time Calculation (min) (ACUTE ONLY): 20 min  Charges:  $Gait Training: 8-22 mins                     Jonathan [name], PT, DPT Acute Rehabilitation Services  Pager [phone] Office [phone]  Jonathan [name] 11/04/2020, 5:14 PM

## 2020-11-04 NOTE — Progress Notes (Addendum)
Patient ID: Yanky Vanderburg, male   DOB: Jul 16, 1985, 35 y.o.   MRN: [id number]     Advanced Heart Failure Rounding Note  PCP-Cardiologist: Armanda Magic, MD  HF MD: Dr. Gala Romney   Patient Profile   Mr. Dejaynes is a 35 year old male with history of HFrEF, NICM, HTN, asthma, OSA, morbid obesity, and uncontrolled DM. Hx of medical noncompliance. Admitted on 09/13 with acute on chronic systolic HF, severe hyperglycemia, AKI and abdominal pain.    Subjective:    09/13: Started on Empiric milrinone 0.25, lasix gtt at 10/hr 09/14: Developed AFL w/ RVR>>amio gtt started   On 0.25 milrinone. Co-ox 48.4% + amio drip+ heparin drip .   Remains on lasix gtt at 30 mg/hr and acetazolamide 250 mg bid.  Brisk diuresis noted. Weight down another 8 pounds.   Denies SOB. Cramps in his hands.   Cardiac Studies   Echo 09/22: EF < 20%, RV fxn moderately reduced, trivial MR, mild TR Echo 10/21: EF 30-35% with moderate RV dysfunction in setting of recurrent RBBB with significant dyssynchrony.  ECHO 04/2019: EF 30-35%  ECHO 2019: EF 55%   Echo EF 15-20% in [place] in 2017. Cath around that time without any significant CAD per patient.  Objective:   Weight Range: (!) 168.8 kg Body mass index is 47.79 kg/m.   Vital Signs:   Temp:  [97.3 F (36.3 C)-99.2 F (37.3 C)] 97.8 F (36.6 C) (09/20 0406) Pulse Rate:  [108-115] 115 (09/20 0406) Resp:  [20-22] 22 (09/20 0406) BP: (108-137)/(62-104) 137/104 (09/20 0406) SpO2:  [97 %-98 %] 98 % (09/20 0406) Weight:  [168.8 kg] 168.8 kg (09/20 0406) Last BM Date: 11/01/20  Weight change: Filed Weights   11/02/20 0150 11/03/20 0407 11/04/20 0406  Weight: (!) 177.1 kg (!) 172.5 kg (!) 168.8 kg    Intake/Output:   Intake/Output Summary (Last 24 hours) at 11/04/2020 0742 Last data filed at 11/04/2020 0634 Gross per 24 hour  Intake 2499.76 ml  Output 2900 ml  Net -400.24 ml      Physical Exam  CVP 16-17 checked sitting on the side of the bed.  General:  Sitting on the side of the bed. No resp difficulty HEENT: normal Neck: supple. JVP to jaw  Carotids 2+ bilat; no bruits. No lymphadenopathy or thryomegaly appreciated. Cor: PMI nondisplaced. Tachy Regular rate & rhythm. No rubs or murmurs. +S3  Lungs: clear on room air.  Abdomen: soft, nontender, nondistended. No hepatosplenomegaly. No bruits or masses. Good bowel sounds. Extremities: no cyanosis, clubbing, rash, R and LLE 1-2+ edema. RE PICC Neuro: alert & orientedx3, cranial nerves grossly intact. moves all 4 extremities w/o difficulty. Affect pleasant  Telemetry   Sinus Tach 100-110s   Labs    CBC Recent Labs    11/03/20 0543 11/04/20 0545  WBC 5.9 6.9  HGB 15.7 14.4  HCT 47.1 43.6  MCV 90.8 90.6  PLT 212 214   Basic Metabolic Panel Recent Labs    78/29/56 0543 11/04/20 0545  NA 130* 128*  K 3.3* 3.9  CL 86* 85*  CO2 31 29  GLUCOSE 190* 255*  BUN 25* 26*  CREATININE 1.83* 1.96*  CALCIUM 9.7 9.4  MG 2.0 2.0   Liver Function Tests No results for input(s): AST, ALT, ALKPHOS, BILITOT, PROT, ALBUMIN in the last 72 hours.  No results for input(s): LIPASE, AMYLASE in the last 72 hours. Cardiac Enzymes No results for input(s): CKTOTAL, CKMB, CKMBINDEX, TROPONINI in the last 72 hours.  BNP: BNP (  last 3 results) Recent Labs    05/01/20 1020 09/11/20 0951 10/28/20 1315  BNP 862.1* 803.3* 1,547.7*    ProBNP (last 3 results) No results for input(s): PROBNP in the last 8760 hours.   D-Dimer No results for input(s): DDIMER in the last 72 hours. Hemoglobin A1C No results for input(s): HGBA1C in the last 72 hours. Fasting Lipid Panel No results for input(s): CHOL, HDL, LDLCALC, TRIG, CHOLHDL, LDLDIRECT in the last 72 hours. Thyroid Function Tests No results for input(s): TSH, T4TOTAL, T3FREE, THYROIDAB in the last 72 hours.  Invalid input(s): FREET3  Other results:   Imaging    No results found.   Medications:     Scheduled Medications:   acetaZOLAMIDE  250 mg Oral BID   Chlorhexidine Gluconate Cloth  6 each Topical Daily   digoxin  0.125 mg Oral Daily   insulin aspart  0-15 Units Subcutaneous TID WC   insulin aspart  10 Units Subcutaneous TID WC   insulin glargine-yfgn  35 Units Subcutaneous Daily   loratadine  10 mg Oral Daily   melatonin  3 mg Oral QHS   potassium chloride  40 mEq Oral QID   sodium chloride flush  10-40 mL Intracatheter Q12H   sodium chloride flush  3 mL Intravenous Q12H   spironolactone  25 mg Oral Daily    Infusions:  amiodarone 30 mg/hr (11/04/20 0634)   furosemide (LASIX) 200 mg in dextrose 5% 100 mL (2mg /mL) infusion 30 mg/hr (11/04/20 0634)   heparin 2,250 Units/hr (11/04/20 0634)   milrinone 0.25 mcg/kg/min (11/04/20 0634)   sodium chloride      PRN Medications: acetaminophen **OR** acetaminophen, alum & mag hydroxide-simeth, sodium chloride flush  Assessment/Plan   A/C HFrEF, NICM  -Most recent ECHO back in 2021 EF 30-35 % with moderate RV dysfunction in the setting of recurrent RBBB with significant dyssynchrony. Echo this admit with EF < 20% and moderate RV dysfunction - Marked volume overload w/ low output. On 0.25 milrinone. Co-ox 48%.  -Volume status improving. CVP 16-17. Overall weight down 38 pounds.  Suspect we will back off diuretics tomorrow.  -Needs compression on lower extremities however he is intolerant unna boots and ted hose.  - Continue Lasix 30 mg/hr and acetazolamide 250 mg bid.  Watch HCO3 (29)day)  - Intolerant SGLT2i due to candidiasis +uncontrolled DM - Continue spiro 25 mg daily - Continue digoxin 0.125.  - Hopefully can add back Entresto soon.  -  Not candidate for VAD or transplant currently due to size and Hgba1c > 14     2. Uncontrolled DM -9/1 Hgb A1C > 14.  -Per primary team.    3. AKI -Baseline cretrending up 1.96.  - 2/2 low output heart failure/ cardiorenal .  - continue diuresis + inotrope supp   4. Atrial Flutter  - c/w amio gtt while on  milrinone -In Sinus Tach. Continue IV amio while on milrinone - Heparin gtt   5. Hypervolemic Hyponatremia  - Na 125 >127>126>130>129>130 >128   7. OSA: Severe OSA AHI 65 -  Bipap QHS   8. Morbid Obesity  Body mass index is 47.79 kg/m.  9. H/O medical noncompliance -Reports taking HF medications prior to admit. Missed last few clinic f/u and discharged from HF paramedicine d/t noncompliance.  -Consider HF paramedicine at discharge if willing  10. Hypokalemia/ Hypomagnesemia - Replace K today.   11. NSVT  - Watch K and Mag.  Keep K 4 and Mag 2 Supp K  3.9  Mag 2  12. GOC Palliative Care following.for GOC.    Consult cardiac rehab.   Length of Stay: 7  Amy Clegg, NP  11/04/2020, 7:42 AM  Advanced Heart Failure Team Pager [phone] (M-F; 7a - 5p)  Please contact [facility] for night-coverage after hours (5p -7a ) and weekends on [email or web address]  Patient seen and examined with the above-signed Advanced Practice Provider and/or Housestaff. I personally reviewed laboratory data, imaging studies and relevant notes. I independently examined the patient and formulated the important aspects of the plan. I have edited the note to reflect any of my changes or salient points. I have personally discussed the plan with the patient and/or family.  Continue to diurese on IV lasix and milrinone. Weight down 39 pounds. Co-ox down to 48% today. CVP 16-17. SCr edging u to 1.9  General:  Sitting up on side of bed No resp difficulty HEENT: normal Neck: supple. JVP up  Carotids 2+ bilat; no bruits. No lymphadenopathy or thryomegaly appreciated. Cor: PMI nondisplaced. Regular tachy + s3 Lungs: clear Abdomen: obese soft, nontender, nondistended. No hepatosplenomegaly. No bruits or masses. Good bowel sounds. Extremities: no cyanosis, clubbing, rash, 2+ edema Neuro: alert & orientedx3, cranial nerves grossly intact. moves all 4 extremities w/o difficulty. Affect pleasant  He remains very  tenuous. Co-ox low on milrinone. Diuresing well but creatinine trending up. Will diurese at least 1 more day as renal function tolerates. Once full diuresed will try to wean milrinone slowly and see what co-ox does. Repeat co-ox now. May need to titrate inotropes.  Arvilla Meres, MD  8:15 AM

## 2020-11-04 NOTE — Progress Notes (Signed)
   11/04/20 1938  Assess: MEWS Score  Temp (!) 97.5 F (36.4 C)  BP (!) 92/56  Pulse Rate (!) 109  ECG Heart Rate (!) 108  Resp 20  SpO2 98 %  O2 Device Room Air  Assess: MEWS Score  MEWS Temp 0  MEWS Systolic 1  MEWS Pulse 1  MEWS RR 0  MEWS LOC 0  MEWS Score 2  MEWS Score Color Yellow  Assess: if the MEWS score is Yellow or Red  Were vital signs taken at a resting state? Yes  Focused Assessment No change from prior assessment  Early Detection of Sepsis Score *See Row Information* Low  MEWS guidelines implemented *See Row Information* No, previously yellow, continue vital signs every 4 hours  Treat  Pain Scale 0-10  Pain Score 0  Notify: Charge Nurse/RN  Name of Charge Nurse/RN Notified Jequ[place] RN  Date Charge Nurse/RN Notified 11/04/20  Time Charge Nurse/RN Notified 2000  Document  Patient Outcome Not stable and remains on department  Progress note created (see row info) Yes

## 2020-11-04 NOTE — Progress Notes (Signed)
Inpatient Diabetes Program Recommendations  AACE/ADA: New Consensus Statement on Inpatient Glycemic Control (2015)  Target Ranges:  Prepandial:   less than 140 mg/dL      Peak postprandial:   less than 180 mg/dL (1-2 hours)      Critically ill patients:  140 - 180 mg/dL   Lab Results  Component Value Date   GLUCAP 258 (H) 11/04/2020   HGBA1C >14.0 (A) 10/16/2020    Review of Glycemic Control Results for Jonathan [name], Jonathan [name] (MRN [id number]) as of 11/04/2020 09:36  Ref. Range 11/03/2020 05:58 11/03/2020 11:38 11/03/2020 16:40 11/03/2020 20:48 11/04/2020 06:09  Glucose-Capillary Latest Ref Range: 70 - 99 mg/dL 038 (H) 882 (H) 800 (H) 259 (H) 258 (H)   Diabetes history: DM 2 Outpatient Diabetes medications:  Trulicity 1.5 mg weekly Current orders for Inpatient glycemic control:  Novolog moderate tid with meals Semglee 35 units daily Novolog 10 units tid meal coverage Novolog 0-15 units tid  Inpatient Diabetes Program Recommendations:    - Increase Semglee to 42 units  Christena Deem RN, MSN, BC-ADM Inpatient Diabetes Coordinator Team Pager [phone] (8a-5p)

## 2020-11-04 NOTE — Progress Notes (Signed)
ANTICOAGULATION CONSULT NOTE  Pharmacy Consult for heparin  Indication: atrial fibrillation  Allergies  Allergen Reactions   Hydrocodone     Hives      Patient Measurements: Height: 6\' 2"  (188 cm) Weight: (!) 168.8 kg (372 lb 3.2 oz) IBW/kg (Calculated) : 82.2 Heparin Dosing Weight:  127.7kg  Vital Signs: Temp: 97.8 F (36.6 C) (09/20 0750) Temp Source: Oral (09/20 0750) BP: 138/91 (09/20 0750) Pulse Rate: 110 (09/20 0750)  Labs: Recent Labs    11/02/20 0500 11/02/20 0650 11/03/20 0543 11/03/20 1207 11/04/20 0545  HGB 14.8  --  15.7  --  14.4  HCT 44.9  --  47.1  --  43.6  PLT 232  --  212  --  214  HEPARINUNFRC  --  0.34  --  0.50 0.51  CREATININE 1.82*  --  1.83*  --  1.96*     Estimated Creatinine Clearance: 86.9 mL/min (A) (by C-G formula based on SCr of 1.96 mg/dL (H)).   Assessment: 35 yo M with hx of HFrEF and new atrial flutter. He has been transitioned from bivalirudin back to heparin. Heparin level 0.51 at goal on 2250 units/hr. No bleeding noted. CBC stable   Goal of Therapy:  Heparin level 0.3-0.7 Monitor platelets by anticoagulation protocol: Yes   Plan:  Continue heparin at 2250 units/hr Monitor daily heparin level and CBC   31, PharmD PGY2 Cardiology Pharmacy Resident Phone: [phone] 11/04/2020  9:17 AM Please check [email or web address] for unit-specific pharmacy phone numbers.

## 2020-11-05 ENCOUNTER — Other Ambulatory Visit: Payer: Self-pay

## 2020-11-05 DIAGNOSIS — I10 Essential (primary) hypertension: Secondary | ICD-10-CM | POA: Diagnosis not present

## 2020-11-05 DIAGNOSIS — Z515 Encounter for palliative care: Secondary | ICD-10-CM | POA: Diagnosis not present

## 2020-11-05 DIAGNOSIS — E669 Obesity, unspecified: Secondary | ICD-10-CM | POA: Diagnosis not present

## 2020-11-05 DIAGNOSIS — Z7189 Other specified counseling: Secondary | ICD-10-CM | POA: Diagnosis not present

## 2020-11-05 DIAGNOSIS — G4733 Obstructive sleep apnea (adult) (pediatric): Secondary | ICD-10-CM | POA: Diagnosis not present

## 2020-11-05 DIAGNOSIS — I509 Heart failure, unspecified: Secondary | ICD-10-CM | POA: Diagnosis not present

## 2020-11-05 DIAGNOSIS — I5023 Acute on chronic systolic (congestive) heart failure: Secondary | ICD-10-CM | POA: Diagnosis not present

## 2020-11-05 DIAGNOSIS — E1169 Type 2 diabetes mellitus with other specified complication: Secondary | ICD-10-CM | POA: Diagnosis not present

## 2020-11-05 LAB — COOXEMETRY PANEL
Carboxyhemoglobin: 1.3 % (ref 0.5–1.5)
Carboxyhemoglobin: 1.3 % (ref 0.5–1.5)
Carboxyhemoglobin: 1.2 % (ref 0.5–1.5)
Methemoglobin: 0.5 % (ref 0.0–1.5)
Methemoglobin: 0.6 % (ref 0.0–1.5)
Methemoglobin: 0.7 % (ref 0.0–1.5)
O2 Saturation: 51.5 %
O2 Saturation: 49.1 %
O2 Saturation: 53.9 %
Total hemoglobin: 14.1 g/dL (ref 12.0–16.0)
Total hemoglobin: 19.2 g/dL — ABNORMAL HIGH (ref 12.0–16.0)
Total hemoglobin: 14.2 g/dL (ref 12.0–16.0)

## 2020-11-05 LAB — CBC
HCT: 43.1 % (ref 39.0–52.0)
Hemoglobin: 14.4 g/dL (ref 13.0–17.0)
MCH: 30.4 pg (ref 26.0–34.0)
MCHC: 33.4 g/dL (ref 30.0–36.0)
MCV: 91.1 fL (ref 80.0–100.0)
Platelets: 205 10*3/uL (ref 150–400)
RBC: 4.73 MIL/uL (ref 4.22–5.81)
RDW: 14 % (ref 11.5–15.5)
WBC: 7.3 10*3/uL (ref 4.0–10.5)
nRBC: 0 % (ref 0.0–0.2)

## 2020-11-05 LAB — GLUCOSE, CAPILLARY
Glucose-Capillary: 240 mg/dL — ABNORMAL HIGH (ref 70–99)
Glucose-Capillary: 204 mg/dL — ABNORMAL HIGH (ref 70–99)
Glucose-Capillary: 182 mg/dL — ABNORMAL HIGH (ref 70–99)
Glucose-Capillary: 196 mg/dL — ABNORMAL HIGH (ref 70–99)

## 2020-11-05 LAB — TROPONIN I (HIGH SENSITIVITY): Troponin I (High Sensitivity): 110 ng/L (ref <18)

## 2020-11-05 LAB — LACTIC ACID, PLASMA
Lactic Acid, Venous: 2 mmol/L (ref 0.5–1.9)
Lactic Acid, Venous: 1.3 mmol/L (ref 0.5–1.9)

## 2020-11-05 LAB — MAGNESIUM: Magnesium: 2.2 mg/dL (ref 1.7–2.4)

## 2020-11-05 LAB — BASIC METABOLIC PANEL
Anion gap: 12 (ref 5–15)
BUN: 27 mg/dL — ABNORMAL HIGH (ref 6–20)
CO2: 28 mmol/L (ref 22–32)
Calcium: 9.4 mg/dL (ref 8.9–10.3)
Chloride: 88 mmol/L — ABNORMAL LOW (ref 98–111)
Creatinine, Ser: 1.92 mg/dL — ABNORMAL HIGH (ref 0.61–1.24)
GFR, Estimated: 46 mL/min — ABNORMAL LOW (ref >60)
Glucose, Bld: 288 mg/dL — ABNORMAL HIGH (ref 70–99)
Potassium: 4.1 mmol/L (ref 3.5–5.1)
Sodium: 128 mmol/L — ABNORMAL LOW (ref 135–145)

## 2020-11-05 LAB — HEPARIN LEVEL (UNFRACTIONATED): Heparin Unfractionated: 0.58 IU/mL (ref 0.30–0.70)

## 2020-11-05 MED ORDER — HYDROXYZINE HCL 25 MG PO TABS
25.0000 mg | ORAL_TABLET | Freq: Three times a day (TID) | ORAL | Status: DC | PRN
  Administered 2020-11-05: 25 mg via ORAL
  Filled 2020-11-05: qty 1

## 2020-11-05 MED ORDER — METOLAZONE 2.5 MG PO TABS
2.5000 mg | ORAL_TABLET | Freq: Once | ORAL | Status: AC
  Administered 2020-11-05: 2.5 mg via ORAL
  Filled 2020-11-05: qty 1

## 2020-11-05 MED ORDER — CAMPHOR-MENTHOL 0.5-0.5 % EX LOTN
TOPICAL_LOTION | CUTANEOUS | Status: DC | PRN
  Filled 2020-11-05: qty 222

## 2020-11-05 MED ORDER — INSULIN ASPART 100 UNIT/ML IJ SOLN: 15.0000 [IU] | Freq: Three times a day (TID) | INTRAMUSCULAR | Status: DC

## 2020-11-05 MED ORDER — INSULIN ASPART 100 UNIT/ML IJ SOLN
15.0000 [IU] | Freq: Three times a day (TID) | INTRAMUSCULAR | Status: DC
  Administered 2020-11-05 – 2020-11-09 (×12): 15 [IU] via SUBCUTANEOUS

## 2020-11-05 NOTE — Progress Notes (Signed)
ANTICOAGULATION CONSULT NOTE  Pharmacy Consult for heparin  Indication: atrial fibrillation  Allergies  Allergen Reactions   Hydrocodone     Hives      Patient Measurements: Height: 6\' 2"  (188 cm) Weight: (!) 167.8 kg (369 lb 14.4 oz) IBW/kg (Calculated) : 82.2 Heparin Dosing Weight:  127.7kg  Vital Signs: Temp: 97.8 F (36.6 C) (09/21 0814) Temp Source: Oral (09/21 0814) BP: 109/75 (09/21 0814) Pulse Rate: 104 (09/21 0814)  Labs: Recent Labs    11/03/20 0543 11/03/20 1207 11/04/20 0545 11/05/20 0453 11/05/20 0840  HGB 15.7  --  14.4 14.4  --   HCT 47.1  --  43.6 43.1  --   PLT 212  --  214 205  --   HEPARINUNFRC  --  0.50 0.51 0.58  --   CREATININE 1.83*  --  1.96* 1.92*  --   TROPONINIHS  --   --   --   --  110*     Estimated Creatinine Clearance: 88.4 mL/min (A) (by C-G formula based on SCr of 1.92 mg/dL (H)).   Assessment: 35 yo M with hx of HFrEF and new atrial flutter. He has been transitioned from bivalirudin back to heparin. Heparin level 0.58 at goal on 2250 units/hr. No bleeding noted. CBC stable   Goal of Therapy:  Heparin level 0.3-0.7 Monitor platelets by anticoagulation protocol: Yes   Plan:  Continue heparin at 2250 units/hr Monitor daily heparin level and CBC   31, PharmD PGY2 Cardiology Pharmacy Resident Phone: [phone] 11/05/2020  11:54 AM Please check [email or web address] for unit-specific pharmacy phone numbers.

## 2020-11-05 NOTE — Progress Notes (Signed)
Pt refusing cpap for the night. ?

## 2020-11-05 NOTE — Progress Notes (Signed)
Palliative Medicine Inpatient Follow Up Note  Chart Reviewed. Patient assessed at the bedside.   No acute distress noted. States he is fatigued today. Denies pain or shortness of breath. His wife is at the bedside (stayed over all night).   We reviewed previous goals of care discussions. Dewight and his wife are clear in expressed goals to continue to treat the treatable. He is not interested in hospice at this time but knows this will be necessary in the near future. Wife shares they are relying on Heart Failure's advisement and to continue with medical treatment until this is no longer effective.   We discussed differences in Palliative vs. Hospice. Educational information offered. Patient and wife request outpatient Palliative support with plans to transition to a more hospice/comfort focused care when appropriate.   MOST form reviewed and discussed at length. Form completed as requested by patient and wife. Discussions regarding full code status with consideration of current illness and co-morbidities with recommendations for DNR/DNI. Patient also has advanced directives in place and ready for completion. He has listed [employer] (wife) and [employer]. Patient and wife confirms wishes for DNR. The patient and family outlined their wishes for the following treatment decisions:  Cardiopulmonary Resuscitation: Do Not Attempt Resuscitation (DNR/No CPR)  Medical Interventions: Limited Additional Interventions: Use medical treatment, IV fluids and cardiac monitoring as indicated, DO NOT USE intubation or mechanical ventilation. May consider use of less invasive airway support such as BiPAP or CPAP. Also provide comfort measures. Transfer to the hospital if indicated. Avoid intensive care.   Antibiotics: Determine use of limitation of antibiotics when infection occurs  IV Fluids: IV fluids for a defined trial period  Feeding Tube: No feeding tube    Discussed the importance of continued  conversation with family and their  medical providers regarding overall plan of care and treatment options, ensuring decisions are within the context of the patients values and GOCs.  Questions addressed and support provided.   Objective Assessment: Vital Signs Vitals:   11/05/20 0345 11/05/20 0814  BP: (!) 128/112 109/75  Pulse: (!) 107 (!) 104  Resp: (!) 24 18  Temp: 98 F (36.7 C) 97.8 F (36.6 C)  SpO2: 95% 96%    Intake/Output Summary (Last 24 hours) at 11/05/2020 1209 Last data filed at 11/05/2020 1001 Gross per 24 hour  Intake 1321.4 ml  Output 4400 ml  Net -3078.6 ml   Last Weight  Most recent update: 11/05/2020  4:30 AM    Weight  167.8 kg (369 lb 14.4 oz)              Gen:  NAD, obese male  HEENT: moist mucous membranes CV: Tachycardia PULM: Wheeze, room air  ABD: soft/nontender/nondistended/normal bowel sounds EXT: bilateral lower extremity edema  Neuro: Alert and oriented x3, mood appropriate   SUMMARY OF RECOMMENDATIONS   DNR/DNI-per patient & wife's request (completed form on chart) MOST form completed ( see above-form on chart) Continue with current plan of care. Patient and family is remaining hopeful for some stability however realistic in their understanding. Will look to directions from Heart Failure in regards to when treatment is no longer and option or providing some stability. Goal is to return home with family support. Patient states he is interested in cardiac rehab and any other recommendations. Was previously receiving Paramedicine support however this was discontinued due to noncompliance.  TOC referral for outpatient palliative with awareness they can transition care to focus on comfort when appropriate.  PMT will continue to follow on as needed basis. May not see daily. Please secure chat with urgent needs.   Time Total: 45 min.   Visit consisted of counseling and education dealing with the complex and emotionally intense issues of symptom  management and palliative care in the setting of serious and potentially life-threatening illness.Greater than 50%  of this time was spent counseling and coordinating care related to the above assessment and plan.  Willette Alma, AGPCNP-BC  Palliative Medicine Team [phone]  Palliative Medicine Team providers are available by phone from 7am to 7pm daily and can be reached through the team cell phone.  Should this patient require assistance outside of these hours, please call the patient's attending physician.

## 2020-11-05 NOTE — Patient Outreach (Signed)
Care Coordination  11/05/2020  Jonathan [name] [birth year] [id number]  [name] is currently admitted as an inpatient at [facility]. [facility]aged Care team will follow the progress of [name] and follow up upon discharge.   [name] RN, BSN [place]  [employer]

## 2020-11-05 NOTE — Progress Notes (Addendum)
Patient ID: Jonathan [name], male   DOB: 1985/04/01, 35 y.o.   MRN: [id number]     Advanced Heart Failure Rounding Note  PCP-Cardiologist: Armanda Magic, MD  HF MD: Dr. Gala Romney   Patient Profile   Jonathan [name] is a 35 year old male with history of HFrEF, NICM, HTN, asthma, OSA, morbid obesity, and uncontrolled DM. Hx of medical noncompliance. Admitted on 09/13 with acute on chronic systolic HF, severe hyperglycemia, AKI and abdominal pain.    Subjective:    09/13: Started on Empiric milrinone 0.25, lasix gtt at 10/hr 09/14: Developed AFL w/ RVR>>amio gtt started   On 0.25 milrinone. Co-ox 51.5% + amio drip+ heparin drip .   Continues on lasix gtt at 30/hr + diamox 250 mg BID. Weight down 2 lb overnight, total of 42 lb since admit  Scr 1.92. K 4.1, Mag 2.2.  No significant dyspnea. Denies CP. Leg edema improved.  Cardiac Studies   Echo 09/22: EF < 20%, RV fxn moderately reduced, trivial MR, mild TR Echo 10/21: EF 30-35% with moderate RV dysfunction in setting of recurrent RBBB with significant dyssynchrony.  ECHO 04/2019: EF 30-35%  ECHO 2019: EF 55%   Echo EF 15-20% in [place] in 2017. Cath around that time without any significant CAD per patient.  Objective:   Weight Range: (!) 167.8 kg Body mass index is 47.49 kg/m.   Vital Signs:   Temp:  [97.5 F (36.4 C)-98.2 F (36.8 C)] 98 F (36.7 C) (09/21 0345) Pulse Rate:  [104-111] 107 (09/21 0345) Resp:  [15-24] 24 (09/21 0345) BP: (92-138)/(56-112) 128/112 (09/21 0345) SpO2:  [87 %-98 %] 95 % (09/21 0345) Weight:  [167.8 kg] 167.8 kg (09/21 0345) Last BM Date: 11/02/20  Weight change: Filed Weights   11/03/20 0407 11/04/20 0406 11/05/20 0345  Weight: (!) 172.5 kg (!) 168.8 kg (!) 167.8 kg    Intake/Output:   Intake/Output Summary (Last 24 hours) at 11/05/2020 0726 Last data filed at 11/05/2020 0433 Gross per 24 hour  Intake 1461.4 ml  Output 3900 ml  Net -2438.6 ml      Physical Exam  CVP 16-17 General:  Well  appearing. No resp difficulty HEENT: normal Neck: supple. JVP to jaw. Carotids 2+ bilat; no bruits. No lymphadenopathy or thryomegaly appreciated. Cor: PMI nondisplaced. Regular rhythm, tachy. No rubs, gallops or murmurs. Lungs: crackles in bases, scattered expiratory wheezes Abdomen: soft, nontender, nondistended. No hepatosplenomegaly. No bruits or masses. Good bowel sounds. Extremities: no cyanosis, clubbing, rash, 2+ edema Neuro: alert & orientedx3, cranial nerves grossly intact. moves all 4 extremities w/o difficulty. Affect pleasant   Telemetry   Sinus tach 110s (personally reviewed)   Labs    CBC Recent Labs    11/04/20 0545 11/05/20 0453  WBC 6.9 7.3  HGB 14.4 14.4  HCT 43.6 43.1  MCV 90.6 91.1  PLT 214 205   Basic Metabolic Panel Recent Labs    95/63/87 0545 11/05/20 0453  NA 128* 128*  K 3.9 4.1  CL 85* 88*  CO2 29 28  GLUCOSE 255* 288*  BUN 26* 27*  CREATININE 1.96* 1.92*  CALCIUM 9.4 9.4  MG 2.0 2.2   Liver Function Tests No results for input(s): AST, ALT, ALKPHOS, BILITOT, PROT, ALBUMIN in the last 72 hours.  No results for input(s): LIPASE, AMYLASE in the last 72 hours. Cardiac Enzymes No results for input(s): CKTOTAL, CKMB, CKMBINDEX, TROPONINI in the last 72 hours.  BNP: BNP (last 3 results) Recent Labs    05/01/20 1020  09/11/20 0951 10/28/20 1315  BNP 862.1* 803.3* 1,547.7*    ProBNP (last 3 results) No results for input(s): PROBNP in the last 8760 hours.   D-Dimer No results for input(s): DDIMER in the last 72 hours. Hemoglobin A1C No results for input(s): HGBA1C in the last 72 hours. Fasting Lipid Panel No results for input(s): CHOL, HDL, LDLCALC, TRIG, CHOLHDL, LDLDIRECT in the last 72 hours. Thyroid Function Tests No results for input(s): TSH, T4TOTAL, T3FREE, THYROIDAB in the last 72 hours.  Invalid input(s): FREET3  Other results:   Imaging    No results found.   Medications:     Scheduled Medications:   acetaZOLAMIDE  250 mg Oral BID   Chlorhexidine Gluconate Cloth  6 each Topical Daily   digoxin  0.125 mg Oral Daily   insulin aspart  0-15 Units Subcutaneous TID WC   insulin aspart  12 Units Subcutaneous TID WC   insulin glargine-yfgn  25 Units Subcutaneous BID   loratadine  10 mg Oral Daily   melatonin  3 mg Oral QHS   potassium chloride  40 mEq Oral QID   sodium chloride flush  10-40 mL Intracatheter Q12H   sodium chloride flush  3 mL Intravenous Q12H   spironolactone  25 mg Oral Daily    Infusions:  amiodarone 30 mg/hr (11/04/20 2127)   furosemide (LASIX) 200 mg in dextrose 5% 100 mL (2mg /mL) infusion 30 mg/hr (11/05/20 0151)   heparin 2,250 Units/hr (11/04/20 2248)   milrinone 0.25 mcg/kg/min (11/05/20 0430)   sodium chloride      PRN Medications: acetaminophen **OR** acetaminophen, alum & mag hydroxide-simeth, sodium chloride flush  Assessment/Plan   A/C HFrEF, NICM  -Most recent ECHO back in 2021 EF 30-35 % with moderate RV dysfunction in the setting of recurrent RBBB with significant dyssynchrony. Echo this admit with EF < 20% and moderate RV dysfunction - Marked volume overload w/ low output. On 0.25 milrinone. Co-ox 51.5%.  -Volume status improving but remains overloaded. CVP 16. Overall weight down 42 lb. Diuresis slowing, down just 2 lb overnight. - Continues on lasix gtt at 30/hr + 250 diamox BID. May need to consider another dose of metolazone. HC03 stable at 28. BUN slowly trending up. Scr 1.56 > 1.7 > 1.83 > 1.92 (baseline prior to admit < 1) -Needs compression on lower extremities however he is intolerant unna boots and ted hose.  - Intolerant SGLT2i due to candidiasis +uncontrolled DM - Continue spiro 25 mg daily - Continue digoxin 0.125.  - Hopefully can add back Entresto soon.  - No beta blocker d/t low output - Not candidate for VAD or transplant currently due to size and Hgba1c > 14     2. Uncontrolled DM -9/1 Hgb A1C > 14.  -Per primary team.    3.  AKI -Baseline cretrending up 1.92.  - 2/2 low output heart failure/ cardiorenal .  - continue diuresis + inotrope supp   4. Atrial Flutter  - c/w amio gtt while on milrinone -Now in Sinus Tach.  - Heparin gtt. Will plan to transition to eliquis at some point   5. Hypervolemic Hyponatremia  - Na 125 >127>126>130>129>130 >128>128 - Continue to monitor   7. OSA: Severe OSA AHI 65 -  Bipap - declined to use last night   8. Morbid Obesity  Body mass index is 47.49 kg/m.  9. H/O medical noncompliance -Reports taking HF medications prior to admit. Missed last few clinic f/u and discharged from HF paramedicine d/t noncompliance.  -  Consider HF paramedicine at discharge if willing  10. Hypokalemia/ Hypomagnesemia - Replace K today with diuresis - Mag 2.2  11. NSVT  - Watch K and Mag.  - Keep K > 4 and Mag > 2   Palliative Care following for GOC discussions.    Mobilize Consult cardiac rehab. PT/OT  Length of Stay: 8  FINCH, LINDSAY N, PA-C  11/05/2020, 7:26 AM  Advanced Heart Failure Team Pager [phone] (M-F; 7a - 5p)  Please contact [facility] for night-coverage after hours (5p -7a ) and weekends on [email or web address]  Patient seen and examined with the above-signed Advanced Practice Provider and/or Housestaff. I personally reviewed laboratory data, imaging studies and relevant notes. I independently examined the patient and formulated the important aspects of the plan. I have edited the note to reflect any of my changes or salient points. I have personally discussed the plan with the patient and/or family.  Remains on milrinone. Co-ox marginal . Continues to diurese slowly on IV lasix at 30 and diamox. Breathing ok   General:  Sitting up on side of bed No resp difficulty HEENT: normal Neck: supple.JVP to jaw Carotids 2+ bilat; no bruits. No lymphadenopathy or thryomegaly appreciated. Cor: PMI nondisplaced. Regular +s3 Lungs: clear Abdomen: obese soft, nontender,  nondistended. No hepatosplenomegaly. No bruits or masses. Good bowel sounds. Extremities: no cyanosis, clubbing, rash, 1-2+ edema Neuro: alert & orientedx3, cranial nerves grossly intact. moves all 4 extremities w/o difficulty. Affect pleasant  Continue milrinone. Continue IV diuresis at least one more day. Will give metolazone 2.5.  Once full diuresed will try to wean milrinone but co-ox already low so I am very concerned that he may be inotrope dependent and options growing more limited.  Arvilla Meres, MD  8:25 AM

## 2020-11-05 NOTE — Progress Notes (Signed)
This chaplain is present with the Pt., Pt. wife, notary, and witnesses for the notarizing of the Pt. Advance Directive:  HCPOA and Living Will.  The Pt. named Veleta Miners as healthcare agent and Odonel Ilance if the healthcare agent is unwilling or unable to serve.  The chaplain gave the Pt. the original AD and two copies of the AD.  The chaplain scanned a copy of the AD into the Pt. EMR.  This chaplain is available for F/U spiritual care as needed.

## 2020-11-05 NOTE — Progress Notes (Signed)
HD#8 SUBJECTIVE:  Patient Summary: Jonathan [name] is a 35 y.o. with a pertinent PMH of HFrEF, uncontrolled insulin-naive type 2 diabetes, hypertension, OSA, who presented with shortness of breath and abdominal pain and admitted for heart failure exacerbation complicated by uncontrolled type 2 diabetes mellitus.   Overnight Events: None  Interim History: Patient endorses feeling relatively the same compared to yesterday.  He does state that he has been able to sleep better.  Dr. Alen Bleacher, who is his PCP, came to see him at bedside yesterday and this seem to have helped with his mental health.  He does state however that the negative impact on his mental health will continue to be a concern while he remains hospitalized.  OBJECTIVE:  Vital Signs: Vitals:   11/04/20 1938 11/05/20 0001 11/05/20 0345 11/05/20 0814  BP: (!) 92/56 120/89 (!) 128/112 109/75  Pulse: (!) 109 (!) 104 (!) 107 (!) 104  Resp: 20 15 (!) 24 18  Temp: (!) 97.5 F (36.4 C) (!) 97.5 F (36.4 C) 98 F (36.7 C) 97.8 F (36.6 C)  TempSrc: Oral Oral Oral Oral  SpO2: 98% (!) 87% 95% 96%  Weight:   (!) 167.8 kg   Height:       Supplemental O2: Room Air SpO2: 96 %  Filed Weights   11/03/20 0407 11/04/20 0406 11/05/20 0345  Weight: (!) 172.5 kg (!) 168.8 kg (!) 167.8 kg     Intake/Output Summary (Last 24 hours) at 11/05/2020 1133 Last data filed at 11/05/2020 1001 Gross per 24 hour  Intake 1321.4 ml  Output 4400 ml  Net -3078.6 ml   Net IO Since Admission: -96,295.93 mL [11/05/20 1133]  Physical Exam: Constitutional: Patient sitting on the side of the bed.  No acute distress noted. Cardio: Tachycardic with regular rhythm.  No murmurs, rubs, gallops. Pulm: Clear to auscultation bilaterally.  Normal effort of breathing. Abdomen: Soft, nontender, nondistended.  Edema present however it is nonpitting. MSK: 2+ pitting edema to mid calf bilaterally, 1+ pitting edema to mid thigh bilaterally.  Edema  approximately to umbilicus, however it is nonpitting. Skin: Skin is cool. Neuro: Alert and oriented x3.  No focal deficit noted. Psych: Appropriate mood and affect.  Patient Lines/Drains/Airways Status     Active Line/Drains/Airways     Name Placement date Placement time Site Days   Peripheral IV 10/31/20 22 G 1.75" Left Forearm 10/31/20  1712  Forearm  5   PICC Double Lumen 10/28/20 PICC Right Cephalic 39 cm 0 cm 10/28/20  2841  -- 8             ASSESSMENT/PLAN:  Assessment: Active Problems:   Diabetes mellitus type 2 in obese (HCC)   Essential hypertension   OSA (obstructive sleep apnea)   Acute on chronic heart failure (HCC)   Goals of care, counseling/discussion   Diabetes mellitus (HCC)   Plan: #Acute on chronic biventricular, low output failure Patient continues to have evidence of acute exacerbation of biventricular, low output heart failure with continued pitting edema to the mid thigh and a persistence of extremities that are cool to the touch with co-ox testing supporting poor perfusion: Total hemoglobin 14.1, carboxyhemoglobin 1.3, methemoglobin 0.5, O2 saturation 51.5.   -Advanced heart failure team consulted, appreciate their recommendations             -Continue Lasix 30 mg/h with acetazolamide 250 mg BID. Trend bicarb.  -Given additional dose of metolazone 2.5 mg             -  Continue milrinone 13.95 mL/h             -Continue digoxin 0.125 mg daily             -Continue amiodarone 30 mg/h             -Continue spironolactone 25 mg daily             -Avoid beta-blockers due to low output             -Continue IV heparin with daily heparin levels and CBC trending.  We will plan to transition to Eliquis at some point.             -K goal greater than 4, Mg goal greater than 2.  Replete as necessary. -Trend BMP -CVP monitoring.  This was 16 today. -Palliative care consulted, appreciate their assistance   #Aflutter In the setting of milrinone. Patient  currently in sinus tachycardia.  -Continue amiodarone, digoxin, and heparin infusion    #Uncontrolled type 2 diabetes mellitus Patient continues to have persistently elevated blood sugars during admission.  -Increase scheduled insulin: 15 units 3 times daily in addition to sliding scale insulin at mealtime. -Continue Semglee 25 units twice daily -CBG monitoring   #Cardiorenal syndrome Patient with AKI likely due to to low output heart failure.  BUN/creatinine of 27/1.82.  It seems that his baseline creatinine is 0.93. Urine output 9/20 was 3.9 L with 1.1 L thus far today, his weight is decreased by around 2 pounds yesterday.  -Nephrology consulted, appreciate their recommendations -Trend BMP -Avoid nephrotoxic agents -Strict I's/O's -Continue IV Lasix and milrinone as above.   #HTN #HLD Historically treated for hypertension with carvedilol 25 mg twice daily, Entresto 97-103 mg twice daily, spironolactone 25 mg daily. -Continue spironolactone 25 mg daily -Consider restarting Entresto during this admission   #OSA Patient declined overnight 9/20 -BiPAP   #Hypervolemic hyponatremia Hyponatremia persists though is stable with morning labs showing Na of 128.  This is likely secondary to volume overload from acute on chronic heart failure exacerbation.  We will continue to diurese. -Trend BMP -Restrict free water  Best Practice: Diet: Cardiac diet IVF: Fluids: None VTE:   Heparin gtt Code: Full AB: None Therapy Recs: Home Health, DME: other 3 in 1 DISPO: Continue to monitor inpatient.  Signature: Champ Mungo, D.O.  Internal Medicine Resident, PGY-1 [facility]  Pager: [phone] 11:33 AM, 11/05/2020   Please contact the on call pager after 5 pm and on weekends at [phone].

## 2020-11-05 NOTE — Progress Notes (Signed)
Patient called RN to room complaining of severe itching. MD paged with above. See New orders.

## 2020-11-05 NOTE — TOC CM/SW Note (Signed)
HF TOC CM spoke to [facility] rep, Jamal Maes they will remain as Palliative Services for pt if he dc home on IV Milrinone, pt will become Home with Hospice and IV Milrinone.  [facility] request pt be transitioned to [facility] Palliative services if pt dc home without IV Milrinone. Waiting final recommendations for home.    Isidoro Donning RN3 CCM, Heart Failure TOC CM [phone]

## 2020-11-05 NOTE — Progress Notes (Signed)
This chaplain is present with the Pt. and Pt. wife-Knyeeshia for creating/updating the Pt. Advance Directive: HCPOA and Living Will.   The Pt. reviewed the AD with the chaplain. The chaplain understands the Pt. prefers a notary visit at 1pm today and has requested a notary presence at this time.  This chaplain will update the Pt. RN-Geraldine as needed.

## 2020-11-05 NOTE — Progress Notes (Signed)
Notified by RN that lactic acid was elevated at 2.0 this am. Co-ox lower at 49%.   Milrinone increased to 0.375.   Recheck lactic acid and Co-ox this afternoon.

## 2020-11-05 NOTE — Progress Notes (Signed)
Paged by nurse about continued itching. Patient was given hydroxyzine earlier for itching which helped but patient is not scheduled for next dose until later this evening. Went to bedside to assess patient. He is resting uncomfortably in bed but in NAD. Says that his arms and legs are itching and that this started after he got potassium earlier today. The hydroxyzine helped but the itching came back. He denies any other symptoms including abdominal discomfort, nausea, vomiting, diarrhea, shortness of breath, wheezing, itching of his mouth, or nose. Exam was unchanged from prior with normal WOB, lungs CTAB, no abdominal tenderness, and no rash noted. Low suspicion for anaphylaxis at this time. Will put in for sarna lotion overnight for breakthrough itching. Can consider gabapentin or an antidepressant if patient experiences continued generalized pruritis but will defer to day team.

## 2020-11-06 ENCOUNTER — Other Ambulatory Visit (HOSPITAL_COMMUNITY): Payer: Self-pay

## 2020-11-06 DIAGNOSIS — Z7189 Other specified counseling: Secondary | ICD-10-CM | POA: Diagnosis not present

## 2020-11-06 DIAGNOSIS — E1169 Type 2 diabetes mellitus with other specified complication: Secondary | ICD-10-CM | POA: Diagnosis not present

## 2020-11-06 DIAGNOSIS — I5023 Acute on chronic systolic (congestive) heart failure: Secondary | ICD-10-CM | POA: Diagnosis not present

## 2020-11-06 DIAGNOSIS — E669 Obesity, unspecified: Secondary | ICD-10-CM | POA: Diagnosis not present

## 2020-11-06 DIAGNOSIS — Z515 Encounter for palliative care: Secondary | ICD-10-CM | POA: Diagnosis not present

## 2020-11-06 DIAGNOSIS — I509 Heart failure, unspecified: Secondary | ICD-10-CM | POA: Diagnosis not present

## 2020-11-06 DIAGNOSIS — I10 Essential (primary) hypertension: Secondary | ICD-10-CM | POA: Diagnosis not present

## 2020-11-06 LAB — GLUCOSE, CAPILLARY
Glucose-Capillary: 215 mg/dL — ABNORMAL HIGH (ref 70–99)
Glucose-Capillary: 239 mg/dL — ABNORMAL HIGH (ref 70–99)
Glucose-Capillary: 180 mg/dL — ABNORMAL HIGH (ref 70–99)
Glucose-Capillary: 199 mg/dL — ABNORMAL HIGH (ref 70–99)

## 2020-11-06 LAB — CBC
HCT: 42.4 % (ref 39.0–52.0)
Hemoglobin: 13.9 g/dL (ref 13.0–17.0)
MCH: 30 pg (ref 26.0–34.0)
MCHC: 32.8 g/dL (ref 30.0–36.0)
MCV: 91.4 fL (ref 80.0–100.0)
Platelets: 209 10*3/uL (ref 150–400)
RBC: 4.64 MIL/uL (ref 4.22–5.81)
RDW: 14.2 % (ref 11.5–15.5)
WBC: 8.1 10*3/uL (ref 4.0–10.5)
nRBC: 0 % (ref 0.0–0.2)

## 2020-11-06 LAB — BASIC METABOLIC PANEL
Anion gap: 12 (ref 5–15)
Anion gap: 13 (ref 5–15)
BUN: 30 mg/dL — ABNORMAL HIGH (ref 6–20)
BUN: 30 mg/dL — ABNORMAL HIGH (ref 6–20)
CO2: 26 mmol/L (ref 22–32)
CO2: 27 mmol/L (ref 22–32)
Calcium: 9.1 mg/dL (ref 8.9–10.3)
Calcium: 9.5 mg/dL (ref 8.9–10.3)
Chloride: 89 mmol/L — ABNORMAL LOW (ref 98–111)
Chloride: 88 mmol/L — ABNORMAL LOW (ref 98–111)
Creatinine, Ser: 2.11 mg/dL — ABNORMAL HIGH (ref 0.61–1.24)
Creatinine, Ser: 2.13 mg/dL — ABNORMAL HIGH (ref 0.61–1.24)
GFR, Estimated: 41 mL/min — ABNORMAL LOW (ref >60)
GFR, Estimated: 41 mL/min — ABNORMAL LOW (ref >60)
Glucose, Bld: 259 mg/dL — ABNORMAL HIGH (ref 70–99)
Glucose, Bld: 249 mg/dL — ABNORMAL HIGH (ref 70–99)
Potassium: 3.9 mmol/L (ref 3.5–5.1)
Potassium: 4.2 mmol/L (ref 3.5–5.1)
Sodium: 127 mmol/L — ABNORMAL LOW (ref 135–145)
Sodium: 128 mmol/L — ABNORMAL LOW (ref 135–145)

## 2020-11-06 LAB — MAGNESIUM: Magnesium: 2.1 mg/dL (ref 1.7–2.4)

## 2020-11-06 LAB — COOXEMETRY PANEL
Carboxyhemoglobin: 1.2 % (ref 0.5–1.5)
Methemoglobin: 0.6 % (ref 0.0–1.5)
O2 Saturation: 55.4 %
Total hemoglobin: 14.3 g/dL (ref 12.0–16.0)

## 2020-11-06 LAB — HEPARIN LEVEL (UNFRACTIONATED): Heparin Unfractionated: 0.62 IU/mL (ref 0.30–0.70)

## 2020-11-06 LAB — DIGOXIN LEVEL: Digoxin Level: 0.5 ng/mL — ABNORMAL LOW (ref 0.8–2.0)

## 2020-11-06 MED ORDER — TORSEMIDE 100 MG PO TABS
100.0000 mg | ORAL_TABLET | Freq: Two times a day (BID) | ORAL | Status: DC
  Administered 2020-11-06 – 2020-11-07 (×3): 100 mg via ORAL
  Filled 2020-11-06 (×3): qty 1

## 2020-11-06 MED ORDER — MILRINONE LACTATE IN DEXTROSE 20-5 MG/100ML-% IV SOLN
0.3750 ug/kg/min | INTRAVENOUS | Status: DC
  Administered 2020-11-06 – 2020-11-07 (×3): 0.25 ug/kg/min via INTRAVENOUS
  Administered 2020-11-07 – 2020-11-08 (×2): 0.2 ug/kg/min via INTRAVENOUS
  Administered 2020-11-08: 0.125 ug/kg/min via INTRAVENOUS
  Administered 2020-11-09: 0.25 ug/kg/min via INTRAVENOUS
  Administered 2020-11-10 (×2): 0.375 ug/kg/min via INTRAVENOUS
  Administered 2020-11-10 (×2): 0.25 ug/kg/min via INTRAVENOUS
  Administered 2020-11-11 – 2020-11-12 (×7): 0.375 ug/kg/min via INTRAVENOUS
  Filled 2020-11-06 (×20): qty 100

## 2020-11-06 MED ORDER — APIXABAN 5 MG PO TABS
5.0000 mg | ORAL_TABLET | Freq: Two times a day (BID) | ORAL | Status: DC
  Administered 2020-11-06 – 2020-11-12 (×13): 5 mg via ORAL
  Filled 2020-11-06 (×13): qty 1

## 2020-11-06 MED ORDER — POTASSIUM CHLORIDE CRYS ER 20 MEQ PO TBCR
40.0000 meq | EXTENDED_RELEASE_TABLET | Freq: Two times a day (BID) | ORAL | Status: DC
  Administered 2020-11-06 – 2020-11-10 (×10): 40 meq via ORAL
  Filled 2020-11-06 (×10): qty 2

## 2020-11-06 NOTE — Progress Notes (Signed)
ANTICOAGULATION CONSULT NOTE  Pharmacy Consult for heparin  Indication: atrial fibrillation  Allergies  Allergen Reactions   Hydrocodone     Hives      Patient Measurements: Height: 6\' 2"  (188 cm) Weight: (!) 165.8 kg (365 lb 8 oz) IBW/kg (Calculated) : 82.2 Heparin Dosing Weight:  127.7kg  Vital Signs: Temp: 98 F (36.7 C) (09/22 0436) Temp Source: Oral (09/22 0436) BP: 120/85 (09/22 0436) Pulse Rate: 105 (09/22 0436)  Labs: Recent Labs    11/04/20 0545 11/05/20 0453 11/05/20 0840 11/06/20 0440 11/06/20 0535  HGB 14.4 14.4  --  13.9  --   HCT 43.6 43.1  --  42.4  --   PLT 214 205  --  209  --   HEPARINUNFRC 0.51 0.58  --   --  0.62  CREATININE 1.96* 1.92*  --  2.11*  --   TROPONINIHS  --   --  110*  --   --      Estimated Creatinine Clearance: 79.9 mL/min (A) (by C-G formula based on SCr of 2.11 mg/dL (H)).   Assessment: 35 yo M with hx of HFrEF and new atrial flutter. He has been transitioned from bivalirudin back to heparin. Heparin level 0.62 at goal on 2250 units/hr. No bleeding noted. CBC stable. Switched back to apixiban 5 mg BID today.  Goal of Therapy:  Heparin level 0.3-0.7 Monitor platelets by anticoagulation protocol: Yes   Plan:  Stop heparin Start apixiban 5mg  BID Monitor CBC   31, PharmD PGY2 Cardiology Pharmacy Resident Phone: [phone] 11/06/2020  8:56 AM Please check [email or web address] for unit-specific pharmacy phone numbers.

## 2020-11-06 NOTE — Progress Notes (Addendum)
Patient ID: Jonathan [name], male   DOB: 1985/12/26, 36 y.o.   MRN: [id number]     Advanced Heart Failure Rounding Note  PCP-Cardiologist: Armanda Magic, MD  HF MD: Dr. Gala Romney   Patient Profile   Jonathan [name] is a 35 year old male with history of HFrEF, NICM, HTN, asthma, OSA, morbid obesity, and uncontrolled DM. Hx of medical noncompliance. Admitted on 09/13 with acute on chronic systolic HF, severe hyperglycemia, AKI and abdominal pain.    Subjective:    09/13: Started on Empiric milrinone 0.25, lasix gtt at 10/hr 09/14: Developed AFL w/ RVR>>amio gtt started  9/21: Milrinone increased 0.375 mcg.   Milrinone 0.375 mcg . CO-OX 55%   Continue amio drip+ heparin drip .   Yesterday diuresed with lasix drip + diamox+ metolazone. Overall weight down 45 pounds. Brisk diuresis noted.   Creatinine trending up 1.8>1.9>2.1   Feels ok. Denies SOB.   Cardiac Studies   Echo 09/22: EF < 20%, RV fxn moderately reduced, trivial MR, mild TR Echo 10/21: EF 30-35% with moderate RV dysfunction in setting of recurrent RBBB with significant dyssynchrony.  ECHO 04/2019: EF 30-35%  ECHO 2019: EF 55%   Echo EF 15-20% in [place] in 2017. Cath around that time without any significant CAD per patient.  Objective:   Weight Range: (!) 165.8 kg Body mass index is 46.93 kg/m.   Vital Signs:   Temp:  [97.6 F (36.4 C)-98.7 F (37.1 C)] 98 F (36.7 C) (09/22 0436) Pulse Rate:  [104-106] 105 (09/22 0436) Resp:  [18-20] 20 (09/22 0436) BP: (109-130)/(75-87) 120/85 (09/22 0436) SpO2:  [95 %-99 %] 99 % (09/22 0436) Weight:  [165.8 kg] 165.8 kg (09/22 0436) Last BM Date: 11/03/20  Weight change: Filed Weights   11/04/20 0406 11/05/20 0345 11/06/20 0436  Weight: (!) 168.8 kg (!) 167.8 kg (!) 165.8 kg    Intake/Output:   Intake/Output Summary (Last 24 hours) at 11/06/2020 0722 Last data filed at 11/06/2020 0528 Gross per 24 hour  Intake 570 ml  Output 6275 ml  Net -5705 ml      Physical Exam   CVP 10-11 personally checked General:  In bed. . No resp difficulty HEENT: normal Neck: supple. JVP 9-10 . Carotids 2+ bilat; no bruits. No lymphadenopathy or thryomegaly appreciated. Cor: PMI nondisplaced. Tachy, Regular rate & rhythm. No rubs or murmurs. +S3  Lungs: clear Abdomen: soft, nontender, nondistended. No hepatosplenomegaly. No bruits or masses. Good bowel sounds. Extremities: no cyanosis, clubbing, rash, R and LLE trace-1+ edema. RUE PICC Neuro: alert & orientedx3, cranial nerves grossly intact. moves all 4 extremities w/o difficulty. Affect pleasant  Telemetry   Sinus Tach 100-110s   Labs    CBC Recent Labs    11/05/20 0453 11/06/20 0440  WBC 7.3 8.1  HGB 14.4 13.9  HCT 43.1 42.4  MCV 91.1 91.4  PLT 205 209   Basic Metabolic Panel Recent Labs    10/62/69 0453 11/06/20 0440  NA 128* 127*  K 4.1 3.9  CL 88* 89*  CO2 28 26  GLUCOSE 288* 259*  BUN 27* 30*  CREATININE 1.92* 2.11*  CALCIUM 9.4 9.1  MG 2.2 2.1   Liver Function Tests No results for input(s): AST, ALT, ALKPHOS, BILITOT, PROT, ALBUMIN in the last 72 hours.  No results for input(s): LIPASE, AMYLASE in the last 72 hours. Cardiac Enzymes No results for input(s): CKTOTAL, CKMB, CKMBINDEX, TROPONINI in the last 72 hours.  BNP: BNP (last 3 results) Recent Labs  05/01/20 1020 09/11/20 0951 10/28/20 1315  BNP 862.1* 803.3* 1,547.7*    ProBNP (last 3 results) No results for input(s): PROBNP in the last 8760 hours.   D-Dimer No results for input(s): DDIMER in the last 72 hours. Hemoglobin A1C No results for input(s): HGBA1C in the last 72 hours. Fasting Lipid Panel No results for input(s): CHOL, HDL, LDLCALC, TRIG, CHOLHDL, LDLDIRECT in the last 72 hours. Thyroid Function Tests No results for input(s): TSH, T4TOTAL, T3FREE, THYROIDAB in the last 72 hours.  Invalid input(s): FREET3  Other results:   Imaging    No results found.   Medications:     Scheduled  Medications:  acetaZOLAMIDE  250 mg Oral BID   Chlorhexidine Gluconate Cloth  6 each Topical Daily   digoxin  0.125 mg Oral Daily   insulin aspart  0-15 Units Subcutaneous TID WC   insulin aspart  15 Units Subcutaneous TID WC   insulin glargine-yfgn  25 Units Subcutaneous BID   loratadine  10 mg Oral Daily   melatonin  3 mg Oral QHS   potassium chloride  40 mEq Oral QID   sodium chloride flush  10-40 mL Intracatheter Q12H   sodium chloride flush  3 mL Intravenous Q12H   spironolactone  25 mg Oral Daily    Infusions:  amiodarone 30 mg/hr (11/05/20 2005)   furosemide (LASIX) 200 mg in dextrose 5% 100 mL (2mg /mL) infusion 30 mg/hr (11/06/20 0528)   heparin 2,250 Units/hr (11/06/20 0644)   milrinone 0.375 mcg/kg/min (11/06/20 0439)   sodium chloride      PRN Medications: acetaminophen **OR** acetaminophen, alum & mag hydroxide-simeth, camphor-menthol, hydrOXYzine, sodium chloride flush  Assessment/Plan   A/C HFrEF, NICM  -Most recent ECHO back in 2021 EF 30-35 % with moderate RV dysfunction in the setting of recurrent RBBB with significant dyssynchrony. Echo this admit with EF < 20% and moderate RV dysfunction - Admitted with marked volume overload w/ low output.  - Volume status looks much better. CVP trending down 10-11. Renal function trending up. Overall weight down 45 pounds.  - Stop lasix drip + diamox. Start torsemide 100 mg twice a day.  - CO-OX 55%. Start to wean milrinone. Cut back milrinone to 0.25 mcg.   -Needs compression on lower extremities however he is intolerant unna boots and ted hose.  - Intolerant SGLT2i due to candidiasis +uncontrolled DM - Continue spiro 25 mg daily - Continue digoxin 0.125. Check dig level with worsening renal function.  - Hopefully can add back Entresto soon.  - No beta blocker d/t low output - Not candidate for VAD or transplant currently due to size and Hgba1c > 14     2. Uncontrolled DM -9/1 Hgb A1C > 14.  -Per primary team.    3.  AKI -Baseline cretrending up 1.92.  - 2/2 low output heart failure/ cardiorenal .  - continue diuresis + inotrope supp   4. Atrial Flutter  - c/w amio gtt while on milrinone -Remains in Sinus Tach.  - Stop heparin drip. Start eliquis 5 mg twice a day.    5. Hypervolemic Hyponatremia  - Na 127 - Restrict free water.  - Continue to monitor   7. OSA: Severe OSA AHI 65 -  Bipap -needs to use nightly.    8. Morbid Obesity  Body mass index is 46.93 kg/m.  9. H/O medical noncompliance -Reports taking HF medications prior to admit. Missed last few clinic f/u and discharged from HF paramedicine d/t noncompliance.  -Consider HF  paramedicine at discharge if willing  10. Hypokalemia/ Hypomagnesemia - K 3.9 but cutting back on diuretics.  - Mag 2.1  11. NSVT  - Watch K and Mag.  - Keep K > 4 and Mag > 2  12. GOC Palliative Care following for GOC discussions. He is now DNR. Palliative Care referral for home .    Plan to check BMET at 1300. Cut back potassium to twice a day. Stopping lasix drip and diamox. Stopping heparin drip and starting eliquis.  Start to wean milrinone and cut back diuretics. If worsens with inotrope wean may need to think about Hospice.   Length of Stay: 9  Amy Clegg, NP  11/06/2020, 7:22 AM  Advanced Heart Failure Team Pager [phone] (M-F; 7a - 5p)  Please contact [facility] for night-coverage after hours (5p -7a ) and weekends on [email or web address]  Patient seen and examined with the above-signed Advanced Practice Provider and/or Housestaff. I personally reviewed laboratory data, imaging studies and relevant notes. I independently examined the patient and formulated the important aspects of the plan. I have edited the note to reflect any of my changes or salient points. I have personally discussed the plan with the patient and/or family.  Milrinone increased yesterday to 0.375 for low co-ox and lactic acidosis. Continues to diurese on lasix  gtt/metolazone/acetazolamide. Weight down 45. CVP 9-10.   General:  Siting up on side of bed No resp difficulty HEENT: normal Neck: supple. JVP up  Carotids 2+ bilat; no bruits. No lymphadenopathy or thryomegaly appreciated. Cor: PMI nondisplaced. Regular  tachy  No rubs, gallops or murmurs. Lungs: clear Abdomen: obese soft, nontender, nondistended. No hepatosplenomegaly. No bruits or masses. Good bowel sounds. Extremities: no cyanosis, clubbing, rash, tr-1+ edema Neuro: alert & orientedx3, cranial nerves grossly intact. moves all 4 extremities w/o difficulty. Affect pleasant  He remains very tenuous. Will stop IV lasix. Try to wean milrinone slowly. Follow co-ox closely. Switch heparin to Eliquis.   Arvilla Meres, MD  9:17 AM

## 2020-11-06 NOTE — Progress Notes (Signed)
Occupational Therapy Treatment Patient Details Name: Jonathan [name] MRN: [id number] DOB: July 21, 1985 Today's Date: 11/06/2020   History of present illness Pt is a 35 y.o. male admitted 10/28/20 with worsening SOB, abdominal pain, BLE edema. Workup for acute CHF exacerbation, AKI, severe hyperglycemia. Pt developed aflutter w/ RVR on 9/14. PMH includes HF, DM, HTN, OSA, obesity.   OT comments  Pt performing standing ADL with supervision and ambulated in hall without AD with supervision and 2 standing rest breaks.    Recommendations for follow up therapy are one component of a multi-disciplinary discharge planning process, led by the attending physician.  Recommendations may be updated based on patient status, additional functional criteria and insurance authorization.    Follow Up Recommendations  No OT follow up    Equipment Recommendations  3 in 1 bedside commode (bariatric)    Recommendations for Other Services      Precautions / Restrictions Precautions Precautions: Fall Other Brace: uses boot on R LE when having more pain from Charcot foot       Mobility Bed Mobility               General bed mobility comments: received at EOB, reports he got from supine to sit himself    Transfers Overall transfer level: Needs assistance Equipment used: None Transfers: Sit to/from Stand Sit to Stand: Modified independent (Device/Increase time)              Balance Overall balance assessment: Needs assistance   Sitting balance-Leahy Scale: Good       Standing balance-Leahy Scale: Fair                             ADL either performed or assessed with clinical judgement   ADL Overall ADL's : Needs assistance/impaired     Grooming: Oral care;Standing;Supervision/safety;Wash/dry Lawyer: Supervision/safety;Ambulation   Toileting- Clothing Manipulation and Hygiene: Supervision/safety       Functional mobility  during ADLs: Supervision/safety (down hall)       Vision       Perception     Praxis      Cognition Arousal/Alertness: Awake/alert Behavior During Therapy: WFL for tasks assessed/performed;Flat affect Overall Cognitive Status: Within Functional Limits for tasks assessed                                          Exercises     Shoulder Instructions       General Comments      Pertinent Vitals/ Pain       Pain Assessment: Faces Faces Pain Scale: Hurts a little bit Pain Location: upper back and neck Pain Descriptors / Indicators: Sore;Tightness Pain Intervention(s): Repositioned  Home Living                                          Prior Functioning/Environment              Frequency  Min 2X/week        Progress Toward Goals  OT Goals(current goals can now be found in the care plan section)  Progress towards OT goals: Progressing toward goals  Acute Rehab  OT Goals Patient Stated Goal: to get better and go home OT Goal Formulation: With patient Time For Goal Achievement: 11/13/20 Potential to Achieve Goals: Good  Plan Discharge plan remains appropriate    Co-evaluation                 AM-PAC OT "6 Clicks" Daily Activity     Outcome Measure   Help from another person eating meals?: None Help from another person taking care of personal grooming?: A Little Help from another person toileting, which includes using toliet, bedpan, or urinal?: A Little Help from another person bathing (including washing, rinsing, drying)?: A Lot Help from another person to put on and taking off regular upper body clothing?: A Little Help from another person to put on and taking off regular lower body clothing?: A Lot 6 Click Score: 17    End of Session    OT Visit Diagnosis: Pain;Unsteadiness on feet (R26.81);Other abnormalities of gait and mobility (R26.89)   Activity Tolerance Patient tolerated treatment well   Patient  Left in bed;with call bell/phone within reach   Nurse Communication          Time: 1355-1418 OT Time Calculation (min): 23 min  Charges: OT General Charges $OT Visit: 1 Visit OT Treatments $Self Care/Home Management : 8-22 mins $Therapeutic Activity: 8-22 mins  Evern Bio 11/06/2020, 2:45 PM Martie Round, OTR/L Acute Rehabilitation Services Pager: [phone] Office: [phone]

## 2020-11-06 NOTE — Progress Notes (Signed)
PT Cancellation Note  Patient Details Name: Jonathan [name] MRN: [id number] DOB: 24-May-1985   Cancelled Treatment:    Reason Eval/Treat Not Completed: Fatigue/lethargy limiting ability to participate. Attempted in the morning, but pt reporting being fatigued after just ambulating prior to PT arrival. PT saw pt ambulating with OT in hallway without AD early afternoon. Attempted again in the afternoon, but pt reporting being too tired having just woken up from a nap and about to get dinner. Will plan to follow-up another day per pt request.   Raymond Gurney, PT, DPT Acute Rehabilitation Services  Pager: [phone] Office: [phone]    [name] 11/06/2020, 4:52 PM

## 2020-11-06 NOTE — Plan of Care (Signed)
?  Problem: Education: ?Goal: Ability to demonstrate management of disease process will improve ?Outcome: Progressing ?  ?

## 2020-11-06 NOTE — Progress Notes (Signed)
HD#9 SUBJECTIVE:  Patient Summary: Jonathan [name] is a 35 y.o. with a pertinent PMH of HFrEF, uncontrolled insulin-naive type 2 diabetes, hypertension, OSA, who presented with shortness of breath and abdominal pain and admitted for heart failure exacerbation complicated by uncontrolled type 2 diabetes mellitus.   Overnight Events: Patient received Sama lotion overnight for itching.  Interim History: Patient states that he is doing about the same today.  He has no new complaints, and has continued to be able to sleep on his back and side at night.  OBJECTIVE:  Vital Signs: Vitals:   11/05/20 0814 11/05/20 2052 11/06/20 0005 11/06/20 0436  BP: 109/75 129/86 130/87 120/85  Pulse: (!) 104 (!) 106 (!) 105 (!) 105  Resp: 18 20 20 20   Temp: 97.8 F (36.6 C) 97.6 F (36.4 C) 98.7 F (37.1 C) 98 F (36.7 C)  TempSrc: Oral Oral Oral Oral  SpO2: 96% 99% 95% 99%  Weight:    (!) 165.8 kg  Height:       Supplemental O2: Room Air SpO2: 99 %  Filed Weights   11/04/20 0406 11/05/20 0345 11/06/20 0436  Weight: (!) 168.8 kg (!) 167.8 kg (!) 165.8 kg     Intake/Output Summary (Last 24 hours) at 11/06/2020 1416 Last data filed at 11/06/2020 1221 Gross per 24 hour  Intake --  Output 7375 ml  Net -7375 ml   Net IO Since Admission: -26,893.93 mL [11/06/20 1416]  Physical Exam: Constitutional: Patient sitting on side of bed.  No acute distress noted. Cardio: Tachycardia with regular rhythm.  No murmurs, rubs, gallops. Pulm: Clear to auscultation bilaterally.  Normal work of breathing. Abdomen: Soft, nontender, nondistended.  Nonpitting edema to the umbilicus. MSK: 2+ pitting edema to mid calf bilaterally, 1+ pitting edema to distal thigh bilaterally, generalized nonpitting edema to umbilicus. Skin: Warm and dry. Neuro: Alert and oriented x3.  No focal deficit noted. Psych: Patient seems down, almost looks defeated.   Patient Lines/Drains/Airways Status     Active Line/Drains/Airways      Name Placement date Placement time Site Days   Peripheral IV 10/31/20 22 G 1.75" Left Forearm 10/31/20  1712  Forearm  6   PICC Double Lumen 10/28/20 PICC Right Cephalic 39 cm 0 cm 10/28/20  10/30/20  -- 9             ASSESSMENT/PLAN:  Assessment: Active Problems:   Diabetes mellitus type 2 in obese (HCC)   Essential hypertension   OSA (obstructive sleep apnea)   Acute on chronic heart failure (HCC)   Goals of care, counseling/discussion   Diabetes mellitus (HCC)   Plan: #Acute on chronic biventricular, low output failure Patient continues to have evidence of acute exacerbation of biventricular, low output heart failure with continued pitting edema to the mid thigh and a persistence of extremities that are cool to the touch with co-ox testing supporting poor perfusion: Total hemoglobin 14.3 carboxyhemoglobin 1.2, methemoglobin 0.6, O2 saturation 55.4.   -Advanced heart failure team consulted, appreciate their recommendations             -Stop Lasix 30 mg/h and acetazolamide 250 mg BID.   -Start torsemide 100 mg twice daily             -Decrease milrinone to 0.25 mcg             -Continue digoxin 0.125 mg daily.  We will check digoxin level given worsening renal function.             -  Continue amiodarone 30 mg/h             -Continue spironolactone 25 mg daily             -Transition from heparin to Eliquis 5 mg daily -K goal greater than 4, Mg goal greater than 2.  Replete as necessary. -Trend BMP -CVP monitoring. -Palliative care consulted, appreciate their assistance   #Aflutter In the setting of milrinone. Patient currently in sinus tachycardia.  -Continue amiodarone, digoxin, and Eliquis   #Uncontrolled type 2 diabetes mellitus Patient continues to have persistently elevated blood sugars during admission.  -Increase scheduled insulin: 15 units 3 times daily in addition to sliding scale insulin at mealtime. -Continue Semglee 25 units twice daily -CBG  monitoring -Consider increasing mealtime insulin tomorrow   #Cardiorenal syndrome Patient with AKI likely due to to low output heart failure.  BUN/creatinine of 30/2.11.  It seems that his baseline creatinine is 0.93. Urine output 9/21 was 5 L with 1.5 L thus far today, his weight is decreased by around 5 pounds yesterday.  -Nephrology consulted, appreciate their recommendations -Trend BMP -Avoid nephrotoxic agents -Strict I's/O's -Continue IV Lasix and milrinone as above.   #HTN #HLD Historically treated for hypertension with carvedilol 25 mg twice daily, Entresto 97-103 mg twice daily, spironolactone 25 mg daily. -Continue spironolactone 25 mg daily -Consider restarting Entresto during this admission   #OSA -BiPAP   #Hypervolemic hyponatremia Hyponatremia persists though is stable with morning labs showing Na of 127.  This is likely secondary to volume overload from acute on chronic heart failure exacerbation.  We will continue to diurese. -Trend BMP -Restrict free water  Best Practice: Diet: Cardiac diet IVF: Fluids: None VTE:   Eliquis Code: DNR AB: None Therapy Recs: Home Health, DME: other 3 in 1 DISPO: Continue to monitor inpatient.  Signature: Champ Mungo, D.O.  Internal Medicine Resident, PGY-1 [facility]  Pager: [phone] 2:16 PM, 11/06/2020   Please contact the on call pager after 5 pm and on weekends at [phone].

## 2020-11-07 ENCOUNTER — Other Ambulatory Visit (HOSPITAL_COMMUNITY): Payer: Self-pay

## 2020-11-07 ENCOUNTER — Telehealth: Payer: Self-pay

## 2020-11-07 DIAGNOSIS — I5023 Acute on chronic systolic (congestive) heart failure: Secondary | ICD-10-CM | POA: Diagnosis not present

## 2020-11-07 DIAGNOSIS — I509 Heart failure, unspecified: Secondary | ICD-10-CM | POA: Diagnosis not present

## 2020-11-07 DIAGNOSIS — Z515 Encounter for palliative care: Secondary | ICD-10-CM | POA: Diagnosis not present

## 2020-11-07 DIAGNOSIS — E669 Obesity, unspecified: Secondary | ICD-10-CM | POA: Diagnosis not present

## 2020-11-07 DIAGNOSIS — Z7189 Other specified counseling: Secondary | ICD-10-CM | POA: Diagnosis not present

## 2020-11-07 DIAGNOSIS — I10 Essential (primary) hypertension: Secondary | ICD-10-CM | POA: Diagnosis not present

## 2020-11-07 DIAGNOSIS — E1169 Type 2 diabetes mellitus with other specified complication: Secondary | ICD-10-CM | POA: Diagnosis not present

## 2020-11-07 LAB — GLUCOSE, CAPILLARY
Glucose-Capillary: 204 mg/dL — ABNORMAL HIGH (ref 70–99)
Glucose-Capillary: 241 mg/dL — ABNORMAL HIGH (ref 70–99)
Glucose-Capillary: 195 mg/dL — ABNORMAL HIGH (ref 70–99)
Glucose-Capillary: 250 mg/dL — ABNORMAL HIGH (ref 70–99)

## 2020-11-07 LAB — MAGNESIUM: Magnesium: 2.3 mg/dL (ref 1.7–2.4)

## 2020-11-07 LAB — BASIC METABOLIC PANEL
Anion gap: 13 (ref 5–15)
BUN: 36 mg/dL — ABNORMAL HIGH (ref 6–20)
CO2: 26 mmol/L (ref 22–32)
Calcium: 9.5 mg/dL (ref 8.9–10.3)
Chloride: 89 mmol/L — ABNORMAL LOW (ref 98–111)
Creatinine, Ser: 2.11 mg/dL — ABNORMAL HIGH (ref 0.61–1.24)
GFR, Estimated: 41 mL/min — ABNORMAL LOW (ref >60)
Glucose, Bld: 212 mg/dL — ABNORMAL HIGH (ref 70–99)
Potassium: 4.2 mmol/L (ref 3.5–5.1)
Sodium: 128 mmol/L — ABNORMAL LOW (ref 135–145)

## 2020-11-07 LAB — CBC
HCT: 43.6 % (ref 39.0–52.0)
Hemoglobin: 15 g/dL (ref 13.0–17.0)
MCH: 31.2 pg (ref 26.0–34.0)
MCHC: 34.4 g/dL (ref 30.0–36.0)
MCV: 90.6 fL (ref 80.0–100.0)
Platelets: 212 10*3/uL (ref 150–400)
RBC: 4.81 MIL/uL (ref 4.22–5.81)
RDW: 14.2 % (ref 11.5–15.5)
WBC: 7.7 10*3/uL (ref 4.0–10.5)
nRBC: 0 % (ref 0.0–0.2)

## 2020-11-07 LAB — COOXEMETRY PANEL
Carboxyhemoglobin: 1.4 % (ref 0.5–1.5)
Methemoglobin: 0.7 % (ref 0.0–1.5)
O2 Saturation: 52.2 %
Total hemoglobin: 14.1 g/dL (ref 12.0–16.0)

## 2020-11-07 MED ORDER — TORSEMIDE 20 MG PO TABS
80.0000 mg | ORAL_TABLET | Freq: Two times a day (BID) | ORAL | Status: DC
  Administered 2020-11-07 – 2020-11-09 (×6): 80 mg via ORAL
  Filled 2020-11-07 (×5): qty 4

## 2020-11-07 MED ORDER — INSULIN GLARGINE-YFGN 100 UNIT/ML ~~LOC~~ SOLN
30.0000 [IU] | Freq: Two times a day (BID) | SUBCUTANEOUS | Status: DC
  Administered 2020-11-07 – 2020-11-12 (×10): 30 [IU] via SUBCUTANEOUS
  Filled 2020-11-07 (×11): qty 0.3

## 2020-11-07 NOTE — Discharge Instructions (Signed)
Information on my medicine - ELIQUIS (apixaban)  This medication education was reviewed with me or my healthcare representative as part of my discharge preparation.    Why was Eliquis prescribed for you? Eliquis was prescribed for you to reduce the risk of forming blood clots that can cause a stroke if you have a medical condition called atrial fibrillation (a type of irregular heartbeat) What do You need to know about Eliquis ? Take your Eliquis TWICE DAILY - one tablet in the morning and one tablet in the evening with or without food.  It would be best to take the doses about the same time each day.  If you have difficulty swallowing the tablet whole please discuss with your pharmacist how to take the medication safely.  Take Eliquis exactly as prescribed by your doctor and DO NOT stop taking Eliquis without talking to the doctor who prescribed the medication.  Stopping may increase your risk of developing a new clot or stroke.  Refill your prescription before you run out.  After discharge, you should have regular check-up appointments with your healthcare provider that is prescribing your Eliquis.  In the future your dose may need to be changed if your kidney function or weight changes by a significant amount or as you get older.  What do you do if you miss a dose? If you miss a dose, take it as soon as you remember on the same day and resume taking twice daily.  Do not take more than one dose of ELIQUIS at the same time.  Important Safety Information A possible side effect of Eliquis is bleeding. You should call your healthcare provider right away if you experience any of the following: ? Bleeding from an injury or your nose that does not stop. ? Unusual colored urine (red or dark brown) or unusual colored stools (red or black). ? Unusual bruising for unknown reasons. ? A serious fall or if you hit your head (even if there is no bleeding).  Some medicines may interact with  Eliquis and might increase your risk of bleeding or clotting while on Eliquis. To help avoid this, consult your healthcare provider or pharmacist prior to using any new prescription or non-prescription medications, including herbals, vitamins, non-steroidal anti-inflammatory drugs (NSAIDs) and supplements.  This website has more information on Eliquis (apixaban): www.Eliquis.com.    

## 2020-11-07 NOTE — Progress Notes (Addendum)
Patient ID: Jonathan [name], male   DOB: 08-01-1985, 35 y.o.   MRN: [id number]     Advanced Heart Failure Rounding Note  PCP-Cardiologist: Armanda Magic, MD  HF MD: Dr. Gala Romney   Patient Profile   Jonathan [name] is a 35 year old male with history of HFrEF, NICM, HTN, asthma, OSA, morbid obesity, and uncontrolled DM. Hx of medical noncompliance. Admitted on 09/13 with acute on chronic systolic HF, severe hyperglycemia, AKI and abdominal pain.    Subjective:    09/13: Started on Empiric milrinone 0.25, lasix gtt at 10/hr 09/14: Developed AFL w/ RVR>>amio gtt started  9/21: Milrinone increased 0.375 mcg.  9/22: Lasix drip and diamox stopped. Switched to torsemide.   Weight down another 4 pounds. Overall down 49 pounds.   Milrinone 0.375 mcg . CO-OX 52%   Continue amio drip .   Yesterday diuresed with lasix drip + diamox+ metolazone. Overall weight down 49 pounds. Brisk diuresis noted.   Creatinine trending up 1.8>1.9>2.1 >2.1    Feeling better. Denies SOB. Walked in the hall x2.   Cardiac Studies   Echo 09/22: EF < 20%, RV fxn moderately reduced, trivial MR, mild TR Echo 10/21: EF 30-35% with moderate RV dysfunction in setting of recurrent RBBB with significant dyssynchrony.  ECHO 04/2019: EF 30-35%  ECHO 2019: EF 55%   Echo EF 15-20% in [place] in 2017. Cath around that time without any significant CAD per patient.  Objective:   Weight Range: (!) 164.1 kg Body mass index is 46.45 kg/m.   Vital Signs:   Temp:  [97.9 F (36.6 C)-98.2 F (36.8 C)] 98 F (36.7 C) (09/23 0428) Pulse Rate:  [102-106] 103 (09/23 0428) Resp:  [16-18] 16 (09/23 0428) BP: (121-144)/(67-79) 144/67 (09/23 0428) SpO2:  [92 %-100 %] 99 % (09/23 0428) Weight:  [164.1 kg] 164.1 kg (09/23 0120) Last BM Date: 11/03/20  Weight change: Filed Weights   11/05/20 0345 11/06/20 0436 11/07/20 0120  Weight: (!) 167.8 kg (!) 165.8 kg (!) 164.1 kg    Intake/Output:   Intake/Output Summary (Last 24 hours) at  11/07/2020 0817 Last data filed at 11/07/2020 0600 Gross per 24 hour  Intake 960 ml  Output 3950 ml  Net -2990 ml      Physical Exam  CVP 10-11 General:  Sitting on the side of the bed. No resp difficulty HEENT: normal Neck: supple. JVP 9-10 . Carotids 2+ bilat; no bruits. No lymphadenopathy or thryomegaly appreciated. Cor: PMI nondisplaced. Tachy regular rate & rhythm. No rubs or murmurs. + S3  Lungs: Coarse  Abdomen: obese, soft, nontender, nondistended. No hepatosplenomegaly. No bruits or masses. Good bowel sounds. Extremities: no cyanosis, clubbing, rash, R and LLe 1+ edema. RE PICC  Neuro: alert & orientedx3, cranial nerves grossly intact. moves all 4 extremities w/o difficulty. Affect pleasant  Telemetry   ST 100s. Personally checked.   Labs    CBC Recent Labs    11/06/20 0440 11/07/20 0550  WBC 8.1 7.7  HGB 13.9 15.0  HCT 42.4 43.6  MCV 91.4 90.6  PLT 209 212   Basic Metabolic Panel Recent Labs    76/28/31 0440 11/06/20 1259 11/07/20 0550  NA 127* 128* 128*  K 3.9 4.2 4.2  CL 89* 88* 89*  CO2 26 27 26   GLUCOSE 259* 249* 212*  BUN 30* 30* 36*  CREATININE 2.11* 2.13* 2.11*  CALCIUM 9.1 9.5 9.5  MG 2.1  --  2.3   Liver Function Tests No results for input(s): AST, ALT,  ALKPHOS, BILITOT, PROT, ALBUMIN in the last 72 hours.  No results for input(s): LIPASE, AMYLASE in the last 72 hours. Cardiac Enzymes No results for input(s): CKTOTAL, CKMB, CKMBINDEX, TROPONINI in the last 72 hours.  BNP: BNP (last 3 results) Recent Labs    05/01/20 1020 09/11/20 0951 10/28/20 1315  BNP 862.1* 803.3* 1,547.7*    ProBNP (last 3 results) No results for input(s): PROBNP in the last 8760 hours.   D-Dimer No results for input(s): DDIMER in the last 72 hours. Hemoglobin A1C No results for input(s): HGBA1C in the last 72 hours. Fasting Lipid Panel No results for input(s): CHOL, HDL, LDLCALC, TRIG, CHOLHDL, LDLDIRECT in the last 72 hours. Thyroid Function  Tests No results for input(s): TSH, T4TOTAL, T3FREE, THYROIDAB in the last 72 hours.  Invalid input(s): FREET3  Other results:   Imaging    No results found.   Medications:     Scheduled Medications:  apixaban  5 mg Oral BID   Chlorhexidine Gluconate Cloth  6 each Topical Daily   digoxin  0.125 mg Oral Daily   insulin aspart  0-15 Units Subcutaneous TID WC   insulin aspart  15 Units Subcutaneous TID WC   insulin glargine-yfgn  25 Units Subcutaneous BID   loratadine  10 mg Oral Daily   melatonin  3 mg Oral QHS   potassium chloride  40 mEq Oral BID   sodium chloride flush  10-40 mL Intracatheter Q12H   sodium chloride flush  3 mL Intravenous Q12H   spironolactone  25 mg Oral Daily   torsemide  100 mg Oral BID    Infusions:  amiodarone 30 mg/hr (11/07/20 0645)   milrinone 0.25 mcg/kg/min (11/07/20 0133)   sodium chloride      PRN Medications: acetaminophen **OR** acetaminophen, alum & mag hydroxide-simeth, camphor-menthol, hydrOXYzine, sodium chloride flush  Assessment/Plan   A/C HFrEF, NICM  -Most recent ECHO back in 2021 EF 30-35 % with moderate RV dysfunction in the setting of recurrent RBBB with significant dyssynchrony. Echo this admit with EF < 20% and moderate RV dysfunction - Admitted with marked volume overload w/ low output.  - Yesterday lasix drip and diamox stopped. Switched to torsemide 100 mg twice a day.  - CVP 10-11. Overall weight down 49 pounds.  - Continue  torsemide 100 mg twice a day. K stable.  - CO-OX 52%. Cut back milrinone to 0.2 mcg. Continue to wean tomorrow.  - Intolerant SGLT2i due to candidiasis +uncontrolled DM - Continue spiro 25 mg daily - Continue digoxin 0.125. Dig level 0.5  - Hopefully can add back Entresto soon.  - No beta blocker d/t low output - Not candidate for VAD or transplant currently due to size and Hgba1c > 14     2. Uncontrolled DM -9/1 Hgb A1C > 14.  -Per primary team.    3. AKI -Creatinine baseline ~ 1.6.   - Creatinine plateu 2.1>2.1   - 2/2 low output heart failure/ cardiorenal .  - continue diuresis + inotrope supp   4. Atrial Flutter  - c/w amio gtt while on milrinone -In Sinus Tach  - Continue eliquis 5 mg twice a day.    5. Hypervolemic Hyponatremia  - Na 128  - Restrict free water.  - Continue to monitor   7. OSA: Severe OSA AHI 65 -  Bipap -needs to use nightly.    8. Morbid Obesity  Body mass index is 46.45 kg/m.  9. H/O medical noncompliance -Reports taking HF medications prior  to admit. Missed last few clinic f/u and discharged from HF paramedicine d/t noncompliance.  -Consider HF paramedicine at discharge if willing  10. Hypokalemia/ Hypomagnesemia - K 4.2  but cutting back on diuretics.  - Mag 2.3  11. NSVT  - Watch K and Mag.  - Keep K > 4 and Mag > 2  12. GOC Palliative Care following for GOC discussions. He is now DNR. Palliative Care referral for home .   Ambulate.   Length of Stay: 10  Jonathan Becket, NP  11/07/2020, 8:17 AM  Advanced Heart Failure Team Pager [phone] (M-F; 7a - 5p)  Please contact [facility] for night-coverage after hours (5p -7a ) and weekends on [email or web address]  Patient seen and examined with the above-signed Advanced Practice Provider and/or Housestaff. I personally reviewed laboratory data, imaging studies and relevant notes. I independently examined the patient and formulated the important aspects of the plan. I have edited the note to reflect any of my changes or salient points. I have personally discussed the plan with the patient and/or family.  On milrinone 0.25. Co-ox marginal. Feels ok. Diuresed well on po torsemide.   Remains in NSR.   Denies SOB, orthopnea or PND.   General:  Sitting up in bed  No resp difficulty HEENT: normal Neck: supple.JVP 10 Carotids 2+ bilat; no bruits. No lymphadenopathy or thryomegaly appreciated. Cor: PMI nondisplaced. Regular tachy No rubs, gallops or murmurs. Lungs: clear Abdomen: obese  soft, nontender, nondistended. No hepatosplenomegaly. No bruits or masses. Good bowel sounds. Extremities: no cyanosis, clubbing, rash, trace edema Neuro: alert & orientedx3, cranial nerves grossly intact. moves all 4 extremities w/o difficulty. Affect pleasant  Remains tenuous. Co-ox marginal on milrinone 0.25. Will cut to 0.2 and see how he does. Will drop torsemide to 80 bid. Again, I worry he may be inotrope dependent but will continue with slow milrinone wean and see how it goes.   Arvilla Meres, MD  9:24 AM

## 2020-11-07 NOTE — Progress Notes (Addendum)
HD#10 SUBJECTIVE:  Patient Summary: Jonathan [name] is a 35 y.o. with a pertinent PMH of HFrEF, uncontrolled insulin-naive type 2 diabetes, hypertension, OSA, who presented with shortness of breath and abdominal pain and admitted for heart failure exacerbation complicated by uncontrolled type 2 diabetes mellitus.   Overnight Events: None  Interim History: Patient was attempting to get some sleep when we evaluated him.  He states that he feels better overall in every aspect.  OBJECTIVE:  Vital Signs: Vitals:   11/06/20 2013 11/07/20 0032 11/07/20 0120 11/07/20 0428  BP: 121/75 127/79  (!) 144/67  Pulse: (!) 106 (!) 102  (!) 103  Resp: 18 17  16   Temp: 98.2 F (36.8 C) 97.9 F (36.6 C)  98 F (36.7 C)  TempSrc: Oral   Oral  SpO2: 92% 100%  99%  Weight:   (!) 164.1 kg   Height:       Supplemental O2: Room Air SpO2: 99 %  Filed Weights   11/05/20 0345 11/06/20 0436 11/07/20 0120  Weight: (!) 167.8 kg (!) 165.8 kg (!) 164.1 kg     Intake/Output Summary (Last 24 hours) at 11/07/2020 1459 Last data filed at 11/07/2020 1100 Gross per 24 hour  Intake 1775.36 ml  Output 2750 ml  Net -974.64 ml   Net IO Since Admission: -27,388.57 mL [11/07/20 1500]  Physical Exam: Constitutional: Patient is resting in bed.  No acute distress noted. Cardio: Tachycardia with regular rhythm.  No murmurs, rubs, gallops. Pulm: Clear to auscultation bilaterally.  Normal effort of breathing. Abdomen: Soft, nontender, nondistended. MSK: Compression stockings on bilateral lower extremities.  2+ pitting edema to knees on bilateral lower extremities, 1+ pitting edema to mid thigh, generalized edema to the level of umbilicus. Skin: Skin is warm and dry. Neuro: Alert and oriented x3.  No focal deficit noted. Psych: Appropriate mood and affect.  Patient Lines/Drains/Airways Status     Active Line/Drains/Airways     Name Placement date Placement time Site Days   Peripheral IV 10/31/20 22 G 1.75" Left  Forearm 10/31/20  1712  Forearm  7   PICC Double Lumen 10/28/20 PICC Right Cephalic 39 cm 0 cm 10/28/20  10/30/20  -- 10             ASSESSMENT/PLAN:  Assessment: Active Problems:   Diabetes mellitus type 2 in obese (HCC)   Essential hypertension   OSA (obstructive sleep apnea)   Acute on chronic heart failure (HCC)   Goals of care, counseling/discussion   Diabetes mellitus (HCC)   Plan: #Acute on chronic biventricular, low output failure Patient continues to have evidence of acute exacerbation of biventricular, low output heart failure with continued pitting edema to the mid thigh and co-ox testing supporting poor perfusion: Total hemoglobin 14.1 carboxyhemoglobin 1.4, methemoglobin 0.7, O2 saturation 55.2.   -Advanced heart failure team consulted, appreciate their recommendations             -Continue Torsemide 80 mg twice daily             -Decrease milrinone to 0.20 mcg             -Continue digoxin 0.125 mg daily.               -Continue amiodarone 30 mg/h             -Continue spironolactone 25 mg daily             -Continue Eliquis 5 mg daily -K goal greater than 4,  Mg goal greater than 2.  Replete as necessary. -Trend BMP -CVP monitoring. -Palliative care consulted, appreciate their assistance   #Aflutter In the setting of milrinone. Patient currently in sinus tachycardia.  -Continue amiodarone, digoxin, and Eliquis as above   #Uncontrolled type 2 diabetes mellitus Patient continues to have persistently elevated blood sugars during admission.  -Continue scheduled insulin: 15 units 3 times daily in addition to sliding scale insulin at mealtime. -Continue Semglee 25 units twice daily -CBG monitoring -Patient reports being on Trulicity 1.5 mg once weekly at home.  We do not have that available inpatient, will request that his wife brings in his home medication.   #Cardiorenal syndrome Patient with AKI likely due to to low output heart failure.  BUN/creatinine of  36/2.11.  It seems that his baseline creatinine is 0.93. Urine output 9/21 was 3.95 L with 300 mL thus far today, his weight is decreased by around 4 pounds yesterday.  -Trend BMP -Avoid nephrotoxic agents -Strict I's/O's -Continue IV Lasix and milrinone as above.   #HTN #HLD Historically treated for hypertension with carvedilol 25 mg twice daily, Entresto 97-103 mg twice daily, spironolactone 25 mg daily. -Continue spironolactone 25 mg daily -Consider restarting Entresto during this admission   #OSA -BiPAP   #Hypervolemic hyponatremia Hyponatremia persists though is stable with morning labs showing Na of 128.  This is likely secondary to volume overload from acute on chronic heart failure exacerbation.  We will continue to diurese. -Trend BMP -Restrict free water  Best Practice: Diet: Cardiac diet IVF: Fluids: None VTE:   Eliquis Code: DNR AB: None Therapy Recs: Home Health, DME: other 3 in 1 DISPO: Continue to monitor inpatient.  Signature: Champ Mungo, D.O.  Internal Medicine Resident, PGY-1 [facility]  Pager: [phone] 3:00 PM, 11/07/2020   Please contact the on call pager after 5 pm and on weekends at [phone].

## 2020-11-07 NOTE — Telephone Encounter (Signed)
  Outcome Approvedon June 30 Request Reference Number: [id number]. FARXIGA TAB 5MG  is approved through 08/14/2021. For further questions, call 08/16/2021 at [phone]. Drug [phone] 5MG  tablets Form [employer] Electronic Prior Authorization Form (2017 NCPDP) Original Claim Info 75 Trial of MetforminPA Req, Dr submit ePA at [facility].comor MD Call [phone] NF-3 DS SUBMIT PA# 72 PA TYPE8Drug Requires Prior Authorization

## 2020-11-07 NOTE — Progress Notes (Signed)
Mobility Specialist Progress Note:   11/07/20 1340  Mobility  Activity Ambulated in hall  Range of Motion/Exercises All extremities;Active  Level of Assistance Independent  Assistive Device None  Minutes Ambulated 8 minutes  Distance Ambulated (ft) 425 ft  Mobility Ambulated with assistance in hallway  Mobility Response Tolerated well  Mobility performed by Mobility specialist  Transport method Ambulatory  $Mobility charge 1 Mobility   Pt received at EOB and willing to participate in mobility. Ambulated in hallway at supervision. Pt did take 1 standing rest break d/t shortness of breath. Pt returned to EOB with call bell in reach and all needs met.   [facility] and safety inspector Phone [phone]

## 2020-11-07 NOTE — Plan of Care (Signed)
  Problem: Activity: Goal: Capacity to carry out activities will improve Outcome: Progressing   Problem: Cardiac: Goal: Ability to achieve and maintain adequate cardiopulmonary perfusion will improve Outcome: Progressing   

## 2020-11-08 DIAGNOSIS — Z7189 Other specified counseling: Secondary | ICD-10-CM | POA: Diagnosis not present

## 2020-11-08 DIAGNOSIS — E669 Obesity, unspecified: Secondary | ICD-10-CM | POA: Diagnosis not present

## 2020-11-08 DIAGNOSIS — Z515 Encounter for palliative care: Secondary | ICD-10-CM | POA: Diagnosis not present

## 2020-11-08 DIAGNOSIS — I10 Essential (primary) hypertension: Secondary | ICD-10-CM | POA: Diagnosis not present

## 2020-11-08 DIAGNOSIS — I509 Heart failure, unspecified: Secondary | ICD-10-CM | POA: Diagnosis not present

## 2020-11-08 DIAGNOSIS — E1169 Type 2 diabetes mellitus with other specified complication: Secondary | ICD-10-CM | POA: Diagnosis not present

## 2020-11-08 LAB — COMPREHENSIVE METABOLIC PANEL
ALT: 25 U/L (ref 0–44)
AST: 21 U/L (ref 15–41)
Albumin: 3 g/dL — ABNORMAL LOW (ref 3.5–5.0)
Alkaline Phosphatase: 98 U/L (ref 38–126)
Anion gap: 12 (ref 5–15)
BUN: 45 mg/dL — ABNORMAL HIGH (ref 6–20)
CO2: 25 mmol/L (ref 22–32)
Calcium: 9.1 mg/dL (ref 8.9–10.3)
Chloride: 88 mmol/L — ABNORMAL LOW (ref 98–111)
Creatinine, Ser: 2.23 mg/dL — ABNORMAL HIGH (ref 0.61–1.24)
GFR, Estimated: 38 mL/min — ABNORMAL LOW (ref >60)
Glucose, Bld: 290 mg/dL — ABNORMAL HIGH (ref 70–99)
Potassium: 4.3 mmol/L (ref 3.5–5.1)
Sodium: 125 mmol/L — ABNORMAL LOW (ref 135–145)
Total Bilirubin: 1.3 mg/dL — ABNORMAL HIGH (ref 0.3–1.2)
Total Protein: 7.4 g/dL (ref 6.5–8.1)

## 2020-11-08 LAB — CBC
HCT: 43.9 % (ref 39.0–52.0)
Hemoglobin: 14.9 g/dL (ref 13.0–17.0)
MCH: 30.7 pg (ref 26.0–34.0)
MCHC: 33.9 g/dL (ref 30.0–36.0)
MCV: 90.5 fL (ref 80.0–100.0)
Platelets: 209 10*3/uL (ref 150–400)
RBC: 4.85 MIL/uL (ref 4.22–5.81)
RDW: 14.1 % (ref 11.5–15.5)
WBC: 7.2 10*3/uL (ref 4.0–10.5)
nRBC: 0 % (ref 0.0–0.2)

## 2020-11-08 LAB — GLUCOSE, CAPILLARY
Glucose-Capillary: 211 mg/dL — ABNORMAL HIGH (ref 70–99)
Glucose-Capillary: 223 mg/dL — ABNORMAL HIGH (ref 70–99)
Glucose-Capillary: 226 mg/dL — ABNORMAL HIGH (ref 70–99)
Glucose-Capillary: 253 mg/dL — ABNORMAL HIGH (ref 70–99)

## 2020-11-08 LAB — COOXEMETRY PANEL
Carboxyhemoglobin: 0.9 % (ref 0.5–1.5)
Carboxyhemoglobin: 1.2 % (ref 0.5–1.5)
Methemoglobin: 0.7 % (ref 0.0–1.5)
Methemoglobin: 0.6 % (ref 0.0–1.5)
O2 Saturation: 45.7 %
O2 Saturation: 60.5 %
Total hemoglobin: 14.8 g/dL (ref 12.0–16.0)
Total hemoglobin: 14.1 g/dL (ref 12.0–16.0)

## 2020-11-08 MED ORDER — INFLUENZA VAC SPLIT QUAD 0.5 ML IM SUSY: 0.5000 mL | PREFILLED_SYRINGE | INTRAMUSCULAR | Status: DC | PRN

## 2020-11-08 NOTE — Progress Notes (Signed)
Patient ID: Jonathan [name], male   DOB: 1985-12-17, 35 y.o.   MRN: [id number]     Advanced Heart Failure Rounding Note  PCP-Cardiologist: Armanda Magic, MD  HF MD: Dr. Gala Romney   Patient Profile   Mr. Grissinger is a 35 year old male with history of HFrEF, NICM, HTN, asthma, OSA, morbid obesity, and uncontrolled DM. Hx of medical noncompliance. Admitted on 09/13 with acute on chronic systolic HF, severe hyperglycemia, AKI and abdominal pain.    Subjective:    09/13: Started on Empiric milrinone 0.25, lasix gtt at 10/hr 09/14: Developed AFL w/ RVR>>amio gtt started  9/21: Milrinone increased 0.375 mcg.  9/22: Lasix drip and diamox stopped. Switched to torsemide.   Milrinone turned down to 0.2 yesterday. Co-ox 46%. On oral torsemide 80 bid. Weight down another 4 pounds. (Total 54 pounds) SCr 2.1 -> 2.2  Denies CP or SOB. No orthopnea or PND.   Cardiac Studies   Echo 09/22: EF < 20%, RV fxn moderately reduced, trivial MR, mild TR Echo 10/21: EF 30-35% with moderate RV dysfunction in setting of recurrent RBBB with significant dyssynchrony.  ECHO 04/2019: EF 30-35%  ECHO 2019: EF 55%   Echo EF 15-20% in [place] in 2017. Cath around that time without any significant CAD per patient.  Objective:   Weight Range: (!) 162.3 kg Body mass index is 45.95 kg/m.   Vital Signs:   Temp:  [97.4 F (36.3 C)-98.7 F (37.1 C)] 98.2 F (36.8 C) (09/24 0724) Pulse Rate:  [98-106] 98 (09/24 0724) Resp:  [16-20] 20 (09/24 0724) BP: (96-139)/(62-95) 115/95 (09/24 0724) SpO2:  [95 %-100 %] 99 % (09/24 0443) Weight:  [162.3 kg] 162.3 kg (09/24 0020) Last BM Date: 11/07/20  Weight change: Filed Weights   11/06/20 0436 11/07/20 0120 11/08/20 0020  Weight: (!) 165.8 kg (!) 164.1 kg (!) 162.3 kg    Intake/Output:   Intake/Output Summary (Last 24 hours) at 11/08/2020 1038 Last data filed at 11/08/2020 0603 Gross per 24 hour  Intake 1851.2 ml  Output 2975 ml  Net -1123.8 ml       Physical Exam    General:  sitting on side of bed No resp difficulty HEENT: normal Neck: supple. no JVD. Carotids 2+ bilat; no bruits. No lymphadenopathy or thryomegaly appreciated. Cor: PMI nondisplaced. Regular rate & rhythm. +s3 Lungs: clear Abdomen: obese soft, nontender, nondistended. No hepatosplenomegaly. No bruits or masses. Good bowel sounds. Extremities: no cyanosis, clubbing, rash, trace edema Neuro: alert & orientedx3, cranial nerves grossly intact. moves all 4 extremities w/o difficulty. Affect pleasant  Telemetry   Sinus 90-100. Personally checked.   Labs    CBC Recent Labs    11/07/20 0550 11/08/20 0436  WBC 7.7 7.2  HGB 15.0 14.9  HCT 43.6 43.9  MCV 90.6 90.5  PLT 212 209    Basic Metabolic Panel Recent Labs    96/75/91 0440 11/06/20 1259 11/07/20 0550 11/08/20 0436  NA 127*   < > 128* 125*  K 3.9   < > 4.2 4.3  CL 89*   < > 89* 88*  CO2 26   < > 26 25  GLUCOSE 259*   < > 212* 290*  BUN 30*   < > 36* 45*  CREATININE 2.11*   < > 2.11* 2.23*  CALCIUM 9.1   < > 9.5 9.1  MG 2.1  --  2.3  --    < > = values in this interval not displayed.    Liver Function  Tests Recent Labs    11/08/20 0436  AST 21  ALT 25  ALKPHOS 98  BILITOT 1.3*  PROT 7.4  ALBUMIN 3.0*    No results for input(s): LIPASE, AMYLASE in the last 72 hours. Cardiac Enzymes No results for input(s): CKTOTAL, CKMB, CKMBINDEX, TROPONINI in the last 72 hours.  BNP: BNP (last 3 results) Recent Labs    05/01/20 1020 09/11/20 0951 10/28/20 1315  BNP 862.1* 803.3* 1,547.7*     ProBNP (last 3 results) No results for input(s): PROBNP in the last 8760 hours.   D-Dimer No results for input(s): DDIMER in the last 72 hours. Hemoglobin A1C No results for input(s): HGBA1C in the last 72 hours. Fasting Lipid Panel No results for input(s): CHOL, HDL, LDLCALC, TRIG, CHOLHDL, LDLDIRECT in the last 72 hours. Thyroid Function Tests No results for input(s): TSH, T4TOTAL, T3FREE, THYROIDAB in the  last 72 hours.  Invalid input(s): FREET3  Other results:   Imaging    No results found.   Medications:     Scheduled Medications:  apixaban  5 mg Oral BID   Chlorhexidine Gluconate Cloth  6 each Topical Daily   digoxin  0.125 mg Oral Daily   insulin aspart  0-15 Units Subcutaneous TID WC   insulin aspart  15 Units Subcutaneous TID WC   insulin glargine-yfgn  30 Units Subcutaneous BID   loratadine  10 mg Oral Daily   melatonin  3 mg Oral QHS   potassium chloride  40 mEq Oral BID   sodium chloride flush  10-40 mL Intracatheter Q12H   sodium chloride flush  3 mL Intravenous Q12H   spironolactone  25 mg Oral Daily   torsemide  80 mg Oral BID    Infusions:  amiodarone 30 mg/hr (11/08/20 0603)   milrinone 0.2 mcg/kg/min (11/08/20 0244)    PRN Medications: acetaminophen **OR** acetaminophen, alum & mag hydroxide-simeth, camphor-menthol, hydrOXYzine, [START ON 11/09/2020] influenza vac split quadrivalent PF, sodium chloride flush  Assessment/Plan   A/C HFrEF, NICM  -Most recent ECHO back in 2021 EF 30-35 % with moderate RV dysfunction in the setting of recurrent RBBB with significant dyssynchrony. Echo this admit with EF < 20% and moderate RV dysfunction - Admitted with marked volume overload w/ low output.  - CO-OX 52% -> 45% on milrinone 0.2  - CVP 10 Overall weight down 53 pounds. SCr getting worse - hold torsemide for now. Repeat co-ox - Intolerant SGLT2i due to candidiasis +uncontrolled DM - Continue spiro 25 mg daily - Continue digoxin 0.125. Dig level 0.5  - Off ARNI due to low BP/AKI - No beta blocker d/t low output - Not candidate for VAD or transplant currently due to size and Hgba1c > 14   - Remains tenuous. Co-ox low on milrinone 0.2. Will hold diuretics. Recheck co-ox I worry he may be inotrope dependent. If can;t wean inotropes may be heading toward Hospice   2. Uncontrolled DM -9/1 Hgb A1C > 14.  -Per primary team.    3. AKI - Due to ATN/cardiorenal.  Creatinine baseline ~ 1.6.  - SCr 2.1->22 - plan as above  4. Atrial Flutter  - c/w amio gtt while on milrinone - Remains in sinus - Continue eliquis 5 mg twice a day.    5. Hypervolemic Hyponatremia  - Na 128 -> 125 - Restrict free water.  - Continue to monitor - can consider tolvaptan as needed   7. OSA: Severe OSA AHI 65 -  Bipap -needs to use nightly.  8. Morbid Obesity  Body mass index is 45.95 kg/m.  9. H/O medical noncompliance -Reports taking HF medications prior to admit. Missed last few clinic f/u and discharged from HF paramedicine d/t noncompliance.  -Consider HF paramedicine at discharge if willing  10. Hypokalemia/ Hypomagnesemia - K 4.3   - Mag 2.3  11. NSVT  - Watch K and Mag.  - Keep K > 4 and Mag > 2  12. GOC Palliative Care following for GOC discussions. He is now DNR. Palliative Care referral for home .   Ambulate.   Length of Stay: 48  Arvilla Meres, MD  11/08/2020, 10:38 AM  Advanced Heart Failure Team Pager [phone] (M-F; 7a - 5p)  Please contact [facility] for night-coverage after hours (5p -7a ) and weekends on [email or web address]  Addendum; Repeat co-ox improved at 60%. Will drop milrinone to 0.125. Long discussion about his situation as above. If fails milrinone wean in hospital, he and his wife would like to try home inotropes though they understand the benefit may not be sustained.   Arvilla Meres, MD  1:18 PM

## 2020-11-08 NOTE — Progress Notes (Signed)
   11/08/20 2036  Assess: MEWS Score  ECG Heart Rate (!) 104  Resp (!) 25  Assess: MEWS Score  MEWS Temp 0  MEWS Systolic 0  MEWS Pulse 1  MEWS RR 1  MEWS LOC 0  MEWS Score 2  MEWS Score Color Yellow  Assess: if the MEWS score is Yellow or Red  Were vital signs taken at a resting state? Yes  Focused Assessment No change from prior assessment  Early Detection of Sepsis Score *See Row Information* Low  MEWS guidelines implemented *See Row Information* No, other (Comment) (HR 102-103, which has been pt's WNL)  Document  Patient Outcome Stabilized after interventions  Progress note created (see row info) Yes

## 2020-11-08 NOTE — Progress Notes (Signed)
HD#11 SUBJECTIVE:  Patient Summary: Jonathan [name] is a 35 y.o. with a pertinent PMH of HFrEF, uncontrolled insulin-naive type 2 diabetes, hypertension, OSA, who presented with shortness of breath and abdominal pain and admitted for heart failure exacerbation complicated by uncontrolled type 2 diabetes mellitus.   Overnight Events: None  Interim History: Patient reports feeling better again today. He is able to sleep comfortably and his extremities feel less swollen. He does state that he feels like when he urinates, it feels more forced now but he doesn't have trouble initiating stream and does not feel like he is unable to completely empty his bladder. He reiterates that being in the hospital for so long is really getting to him mentally and that he misses being able to walk outside and get fresh air. He states that if he had the chance to get outside right now that he would just leave. He was counseled on the importance of the IV milrinone that he is currently receiving in keeping his heart functioning adequately. We explained also that this was helping his kidneys  and that overnight there was a worsening of his renal function on labs that is likely due to this decrease in milrinone yesterday. We explained to him that if he were to leave at this time without support of his heart he would likely not survive very long. He and his wife seemed interestingly casual about this, laughing throughout the conversation. Unclear if this is their way of coping/coming to terms with his prognosis vs. Still lacking full understanding of the situation.  OBJECTIVE:  Vital Signs: Vitals:   11/07/20 2036 11/08/20 0020 11/08/20 0443 11/08/20 0724  BP: 126/86  96/71 (!) 115/95  Pulse: (!) 106 (!) 106 100 98  Resp: 17  16 20   Temp: 97.8 F (36.6 C) 97.8 F (36.6 C) (!) 97.4 F (36.3 C) 98.2 F (36.8 C)  TempSrc: Oral Oral Oral Oral  SpO2: 95% 100% 99%   Weight:  (!) 162.3 kg    Height:       Supplemental  O2: Room Air SpO2: 99 %  Filed Weights   11/06/20 0436 11/07/20 0120 11/08/20 0020  Weight: (!) 165.8 kg (!) 164.1 kg (!) 162.3 kg     Intake/Output Summary (Last 24 hours) at 11/08/2020 0726 Last data filed at 11/08/2020 0603 Gross per 24 hour  Intake 2091.2 ml  Output 2975 ml  Net -883.8 ml   Net IO Since Admission: -29,267.73 mL [11/08/20 0726]  Physical Exam: Constitutional: Patient is playing video games sitting on the side of his bed. No acute distress noted.  Cardio:Tachycardic with regular rhythm. Pulm: Normal work of breathing on room air. Abdomen:Soft, non-tender, non-distended. Generalized edema to level of umbilicus. 11/10/20 pitting edema to bilateral LE to level of knees. Non putting bilaterally to level of umbilicus. Patient has compression stockings on. Skin: Cool and dry. Neuro:Alert and oriented x 3. No focal deficit noted.  Psych: Normal mood and affect. He does have leg bouncing bilaterally when discussing his prognosis.    Patient Lines/Drains/Airways Status     Active Line/Drains/Airways     Name Placement date Placement time Site Days   Peripheral IV 10/31/20 22 G 1.75" Left Forearm 10/31/20  1712  Forearm  8   PICC Double Lumen 10/28/20 PICC Right Cephalic 39 cm 0 cm 10/28/20  10/30/20  -- 11             ASSESSMENT/PLAN:  Assessment: Active Problems:   Diabetes mellitus type  2 in obese [facility])   Essential hypertension   OSA (obstructive sleep apnea)   Acute on chronic heart failure (HCC)   Goals of care, counseling/discussion   Diabetes mellitus (HCC)   Plan: #Acute on chronic biventricular, low output failure Milrinone was decreased to 0.20 mcg yesterday.  Co. ox testing showed total hemoglobin 14.8, carboxyhemoglobin 0.9, methemoglobin 0.7, O2 saturations 45.7.  This is worse compared to yesterday in the setting of milrinone weaning to 0.20 mcg yesterday. -Advanced heart failure team consulted, appreciate their recommendations              -Hold torsemide at this time, repeat co-ox testing.             -Continue milrinone to 0.20 mcg             -Continue digoxin 0.125 mg daily.               -Continue amiodarone 30 mg/h             -Continue spironolactone 25 mg daily             -Continue Eliquis 5 mg daily -K goal greater than 4, Mg goal greater than 2.  Replete as necessary. -Trend BMP -CVP monitoring -Palliative care consulted, appreciate their assistance   #Aflutter In the setting of milrinone. Patient currently in sinus tachycardia.  -Continue amiodarone, digoxin, and Eliquis as above   #Uncontrolled type 2 diabetes mellitus Patient continues to have persistently elevated blood sugars during admission.  -Continue scheduled insulin: 15 units 3 times daily in addition to sliding scale insulin at mealtime. -Continue Semglee 25 units twice daily -CBG monitoring -Patient reports being on Trulicity 1.5 mg once weekly at home.  He has been asked to bring in this medication from home as we do not have it inpatient.   #Cardiorenal syndrome Patient with AKI likely due to to low output heart failure.  BUN/creatinine of worse on morning labs at 45/23.  Milrinone was decreased yesterday 9/23. Urine output 9/23 was 2.975 L. -Trend BMP -Avoid nephrotoxic agents -Strict I's/O's -Holding diuresis at this time.  -Continue milrinone as above.   #HTN #HLD Historically treated for hypertension with carvedilol 25 mg twice daily, Entresto 97-103 mg twice daily, spironolactone 25 mg daily. -Continue spironolactone 25 mg daily -Consider restarting Entresto during this admission   #OSA -BiPAP   #Hypervolemic hyponatremia Hyponatremia persists, down to 125 on morning labs.  -Trend BMP -Consider tolvaptan as needed -Restrict free water  Best Practice: Diet: Cardiac diet IVF: Fluids: None VTE: Eliquis 5 mg daily Code: DNR AB: None Therapy Recs: Home Health, DME: other 3 in 1 DISPO: Continue to monitor  inpatient.  Signature: Champ Mungo, D.O.  Internal Medicine Resident, PGY-1 [facility]  Pager: [phone] 7:26 AM, 11/08/2020   Please contact the on call pager after 5 pm and on weekends at [phone].

## 2020-11-09 DIAGNOSIS — Z7189 Other specified counseling: Secondary | ICD-10-CM | POA: Diagnosis not present

## 2020-11-09 DIAGNOSIS — I509 Heart failure, unspecified: Secondary | ICD-10-CM | POA: Diagnosis not present

## 2020-11-09 DIAGNOSIS — E1169 Type 2 diabetes mellitus with other specified complication: Secondary | ICD-10-CM | POA: Diagnosis not present

## 2020-11-09 DIAGNOSIS — Z515 Encounter for palliative care: Secondary | ICD-10-CM | POA: Diagnosis not present

## 2020-11-09 DIAGNOSIS — I5023 Acute on chronic systolic (congestive) heart failure: Secondary | ICD-10-CM | POA: Diagnosis not present

## 2020-11-09 DIAGNOSIS — E669 Obesity, unspecified: Secondary | ICD-10-CM | POA: Diagnosis not present

## 2020-11-09 DIAGNOSIS — I10 Essential (primary) hypertension: Secondary | ICD-10-CM | POA: Diagnosis not present

## 2020-11-09 LAB — CBC
HCT: 43.7 % (ref 39.0–52.0)
Hemoglobin: 14.8 g/dL (ref 13.0–17.0)
MCH: 30.5 pg (ref 26.0–34.0)
MCHC: 33.9 g/dL (ref 30.0–36.0)
MCV: 90.1 fL (ref 80.0–100.0)
Platelets: 224 10*3/uL (ref 150–400)
RBC: 4.85 MIL/uL (ref 4.22–5.81)
RDW: 14.4 % (ref 11.5–15.5)
WBC: 7.4 10*3/uL (ref 4.0–10.5)
nRBC: 0 % (ref 0.0–0.2)

## 2020-11-09 LAB — BASIC METABOLIC PANEL
Anion gap: 11 (ref 5–15)
BUN: 47 mg/dL — ABNORMAL HIGH (ref 6–20)
CO2: 26 mmol/L (ref 22–32)
Calcium: 9 mg/dL (ref 8.9–10.3)
Chloride: 91 mmol/L — ABNORMAL LOW (ref 98–111)
Creatinine, Ser: 1.99 mg/dL — ABNORMAL HIGH (ref 0.61–1.24)
GFR, Estimated: 44 mL/min — ABNORMAL LOW (ref >60)
Glucose, Bld: 181 mg/dL — ABNORMAL HIGH (ref 70–99)
Potassium: 3.9 mmol/L (ref 3.5–5.1)
Sodium: 128 mmol/L — ABNORMAL LOW (ref 135–145)

## 2020-11-09 LAB — GLUCOSE, CAPILLARY
Glucose-Capillary: 188 mg/dL — ABNORMAL HIGH (ref 70–99)
Glucose-Capillary: 194 mg/dL — ABNORMAL HIGH (ref 70–99)
Glucose-Capillary: 315 mg/dL — ABNORMAL HIGH (ref 70–99)
Glucose-Capillary: 264 mg/dL — ABNORMAL HIGH (ref 70–99)

## 2020-11-09 LAB — COOXEMETRY PANEL
Carboxyhemoglobin: 1.1 % (ref 0.5–1.5)
Carboxyhemoglobin: 0.9 % (ref 0.5–1.5)
Methemoglobin: 0.8 % (ref 0.0–1.5)
Methemoglobin: 0.7 % (ref 0.0–1.5)
O2 Saturation: 40.7 %
O2 Saturation: 42.6 %
Total hemoglobin: 15 g/dL (ref 12.0–16.0)
Total hemoglobin: 13 g/dL (ref 12.0–16.0)

## 2020-11-09 MED ORDER — INSULIN ASPART 100 UNIT/ML IJ SOLN
20.0000 [IU] | Freq: Three times a day (TID) | INTRAMUSCULAR | Status: DC
  Administered 2020-11-09 – 2020-11-12 (×8): 20 [IU] via SUBCUTANEOUS

## 2020-11-09 MED ORDER — FUROSEMIDE 10 MG/ML IJ SOLN
120.0000 mg | Freq: Two times a day (BID) | INTRAVENOUS | Status: DC
  Administered 2020-11-09 – 2020-11-10 (×3): 120 mg via INTRAVENOUS
  Filled 2020-11-09: qty 10
  Filled 2020-11-09 (×2): qty 12
  Filled 2020-11-09 (×2): qty 10
  Filled 2020-11-09: qty 12

## 2020-11-09 NOTE — Progress Notes (Signed)
Physical Therapy Treatment Patient Details Name: Jonathan [name] MRN: [id number] DOB: 06-07-1985 Today's Date: 11/09/2020   History of Present Illness Pt is a 35 y.o. male admitted 10/28/20 with worsening SOB, abdominal pain, BLE edema. Workup for acute CHF exacerbation, AKI, severe hyperglycemia. Pt developed aflutter w/ RVR on 9/14. PMH includes HF, DM, HTN, OSA, obesity.    PT Comments    Pt tolerates treatment well, ambulating for household distances. Pt does continue to fatigue quickly at this time and is encouraged to ambulate out of the room multiple times daily with staff assistance to aide in improving activity tolerance. PT continues to recommend discharge home with HHPT services.   Recommendations for follow up therapy are one component of a multi-disciplinary discharge planning process, led by the attending physician.  Recommendations may be updated based on patient status, additional functional criteria and insurance authorization.  Follow Up Recommendations  Home health PT;Supervision for mobility/OOB     Equipment Recommendations  Other (comment) (bariatric RW, pt may decline)    Recommendations for Other Services       Precautions / Restrictions Precautions Precautions: Fall Precaution Comments: fell prior to admission Required Braces or Orthoses: Other Brace Other Brace: uses boot on R LE when having more pain from Charcot foot Restrictions Weight Bearing Restrictions: No     Mobility  Bed Mobility                    Transfers Overall transfer level: Needs assistance Equipment used: None Transfers: Sit to/from Stand Sit to Stand: Independent            Ambulation/Gait Ambulation/Gait assistance: Supervision Gait Distance (Feet): 150 Feet Assistive device: None Gait Pattern/deviations: Step-through pattern;Wide base of support Gait velocity: reduced Gait velocity interpretation: 1.31 - 2.62 ft/sec, indicative of limited community  ambulator General Gait Details: pt with slowed step-through gait, widened BOS   Stairs             Wheelchair Mobility    Modified Rankin (Stroke Patients Only)       Balance Overall balance assessment: Needs assistance Sitting-balance support: No upper extremity supported;Feet supported Sitting balance-Leahy Scale: Good     Standing balance support: No upper extremity supported;During functional activity Standing balance-Leahy Scale: Fair                              Cognition Arousal/Alertness: Awake/alert Behavior During Therapy: WFL for tasks assessed/performed;Flat affect Overall Cognitive Status: Within Functional Limits for tasks assessed                                        Exercises      General Comments General comments (skin integrity, edema, etc.): VSS on RA      Pertinent Vitals/Pain Pain Assessment: No/denies pain    Home Living                      Prior Function            PT Goals (current goals can now be found in the care plan section) Acute Rehab PT Goals Patient Stated Goal: to get better and go home Progress towards PT goals: Progressing toward goals    Frequency    Min 3X/week      PT Plan Current plan remains appropriate    Co-evaluation  AM-PAC PT "6 Clicks" Mobility   Outcome Measure  Help needed turning from your back to your side while in a flat bed without using bedrails?: None Help needed moving from lying on your back to sitting on the side of a flat bed without using bedrails?: None Help needed moving to and from a bed to a chair (including a wheelchair)?: None Help needed standing up from a chair using your arms (e.g., wheelchair or bedside chair)?: None Help needed to walk in hospital room?: A Little Help needed climbing 3-5 steps with a railing? : A Lot 6 Click Score: 21    End of Session   Activity Tolerance: Patient tolerated treatment  well Patient left: in bed;with call bell/phone within reach;with family/visitor present Nurse Communication: Mobility status PT Visit Diagnosis: Unsteadiness on feet (R26.81);Other abnormalities of gait and mobility (R26.89);Muscle weakness (generalized) (M62.81);History of falling (Z91.81);Difficulty in walking, not elsewhere classified (R26.2);Dizziness and giddiness (R42);Other symptoms and signs involving the nervous system (R29.898);Pain     Time: 2025-4270 PT Time Calculation (min) (ACUTE ONLY): 13 min  Charges:  $Gait Training: 8-22 mins                     Arlyss Gandy, PT, DPT Acute Rehabilitation Pager: [phone]    Arlyss Gandy 11/09/2020, 10:03 AM

## 2020-11-09 NOTE — Progress Notes (Signed)
HD#12 SUBJECTIVE:  Patient Summary: Jonathan [name] is a 35 y.o. with a pertinent PMH of HFrEF, uncontrolled insulin-naive type 2 diabetes, hypertension, OSA, who presented with shortness of breath and abdominal pain and admitted for heart failure exacerbation complicated by uncontrolled type 2 diabetes mellitus.   Overnight Events: None  Interim History:   Patient reports that he is starting to have some discomfort with urinating because he is peeing so frequently. He states that in the past this is the reason he stopped his diuretics. He denies chest pain or shortness of breath. He has been able to ambulate to the restroom without difficulty. He denies abdominal pain or lower extremity pain.   OBJECTIVE:  Vital Signs: Vitals:   11/08/20 2130 11/09/20 0039 11/09/20 0455 11/09/20 0704  BP:  107/83 (!) 128/107 124/82  Pulse:  (!) 102 (!) 102 96  Resp: 16 (!) 21    Temp:  98.4 F (36.9 C) 97.8 F (36.6 C)   TempSrc:  Oral Oral   SpO2:  98% 100%   Weight:   (!) 162.5 kg   Height:       Supplemental O2: Room Air SpO2: 100 %  Filed Weights   11/07/20 0120 11/08/20 0020 11/09/20 0455  Weight: (!) 164.1 kg (!) 162.3 kg (!) 162.5 kg     Intake/Output Summary (Last 24 hours) at 11/09/2020 0958 Last data filed at 11/09/2020 1610 Gross per 24 hour  Intake 749.25 ml  Output 2000 ml  Net -1250.75 ml    Net IO Since Admission: -30,780.16 mL [11/09/20 0958]  Physical Exam: Constitutional: Patient is playing video games sitting on the side of his bed. No acute distress noted.  CV: RRR Pulm: Normal work of breathing on room air, no wheezing or crackles  Abdomen: Soft, non-tender, non-distended.  MSK:2+ bilateral edema LE to level of knees.  Skin: Warm and dry. Neuro:Alert and oriented x 3. No focal deficit noted.  Psych: Broad affect.   Patient Lines/Drains/Airways Status     Active Line/Drains/Airways     Name Placement date Placement time Site Days   Peripheral IV 10/31/20  22 G 1.75" Left Forearm 10/31/20  1712  Forearm  8   PICC Double Lumen 10/28/20 PICC Right Cephalic 39 cm 0 cm 10/28/20  9604  -- 11             ASSESSMENT/PLAN:  Assessment: Active Problems:   Diabetes mellitus type 2 in obese (HCC)   Essential hypertension   OSA (obstructive sleep apnea)   Acute on chronic heart failure (HCC)   Goals of care, counseling/discussion   Diabetes mellitus (HCC)   Plan: #Acute on chronic biventricular, low output failure Milrinone was decreased to 0.125 mcg yesterday  Co. ox testing O2 saturations in the 60s.  However this a.m., co ox 40.7.  Heart failure team following and managing this.  They have already spoken to the patient and his spouse that if he is unable to successfully wean from IV inotrope he will likely need to transition to hospice care.  Per heart failure note family would like to try outpatient inotrope therapy.   -Advanced heart failure team consulted, appreciate their recommendations             -Continue torsemide 80 mg twice daily             -Continue milrinone to 0.125 mcg             -Continue digoxin 0.125 mg daily.               -  Continue amiodarone drip while on milrinone             -Continue spironolactone 25 mg daily             -Continue Eliquis 5 mg daily -K goal greater than 4, Mg goal greater than 2.  Replete as necessary. -Trend BMP -CVP 13 this a.m. -Palliative care consulted, appreciate their assistance   #Aflutter In the setting of milrinone. Patient currently in sinus tachycardia.  -Continue amiodarone, digoxin, and Eliquis as above   #Uncontrolled type 2 diabetes mellitus Patient continues to have persistently elevated blood sugars during admission.  He required 15 units of sliding scale insulin. -Continue scheduled insulin: 1 increase mealtime to 20 units units 3 times daily in addition to sliding scale insulin at mealtime. -Increase to Semglee 30 units twice daily -CBG monitoring -Patient reports being  on Trulicity 1.5 mg once weekly at home.  He has been asked to bring in this medication from home as it is not on formulary  #Cardiorenal syndrome Patient with AKI likely due to to low output heart failure.  BUN/creatinine stable this AM.   -Trend BMP -Avoid nephrotoxic agents -Strict I's/O's -Continue milrinone as above.   #HTN #HLD Historically treated for hypertension with carvedilol 25 mg twice daily, Entresto 97-103 mg twice daily, spironolactone 25 mg daily. -Continue spironolactone 25 mg daily -Consider restarting Entresto during this admission   #OSA -BiPAP   #Hypervolemic hyponatremia Hyponatremia persists, improved on a.m. labs -Trend BMP -Consider tolvaptan as needed -Restrict free water  Best Practice: Diet: Cardiac diet IVF: Fluids: None VTE: Eliquis 5 mg daily Code: DNR AB: None Therapy Recs: Home Health, DME: other 3 in 1 DISPO: Continue to monitor inpatient.  Signature: Marolyn Haller, MD PGY-2 Internal Medicine  Pager [phone]  9:58 AM, 11/09/2020   Please contact the on call pager after 5 pm and on weekends at [phone].

## 2020-11-09 NOTE — Progress Notes (Signed)
Patient ID: Jonathan [name], male   DOB: [birth year], 35 y.o.   MRN: [id number]     Advanced Heart Failure Rounding Note  PCP-Cardiologist: Armanda Magic, MD  HF MD: Dr. Gala Romney   Patient Profile   Jonathan [name] is a 35 year old male with history of HFrEF, NICM, HTN, asthma, OSA, morbid obesity, and uncontrolled DM. Hx of medical noncompliance. Admitted on 09/13 with acute on chronic systolic HF, severe hyperglycemia, AKI and abdominal pain.    Subjective:    09/13: Started on Empiric milrinone 0.25, lasix gtt at 10/hr 09/14: Developed AFL w/ RVR>>amio gtt started  9/21: Milrinone increased 0.375 mcg.  9/22: Lasix drip and diamox stopped. Switched to torsemide.   Milrinone turned down to 0.125 yesterday. Co-ox 41%. On oral torsemide 80 bid. Weight stable overnight (Down a total 54 pounds) SCr 2.1 -> 2.2 -> 2.0 however CVP up to 24  Denies CP, SOB, orthopnea or PND.   Cardiac Studies   Echo 09/22: EF < 20%, RV fxn moderately reduced, trivial MR, mild TR Echo 10/21: EF 30-35% with moderate RV dysfunction in setting of recurrent RBBB with significant dyssynchrony.  ECHO 04/2019: EF 30-35%  ECHO 2019: EF 55%   Echo EF 15-20% in [place] in 2017. Cath around that time without any significant CAD per patient.  Objective:   Weight Range: (!) 162.5 kg Body mass index is 46 kg/m.   Vital Signs:   Temp:  [97.6 F (36.4 C)-98.4 F (36.9 C)] 97.8 F (36.6 C) (09/25 0455) Pulse Rate:  [96-102] 96 (09/25 0704) Resp:  [16-25] 21 (09/25 0039) BP: (107-128)/(82-107) 124/82 (09/25 0704) SpO2:  [93 %-100 %] 100 % (09/25 0455) Weight:  [162.5 kg] 162.5 kg (09/25 0455) Last BM Date: 11/07/20  Weight change: Filed Weights   11/07/20 0120 11/08/20 0020 11/09/20 0455  Weight: (!) 164.1 kg (!) 162.3 kg (!) 162.5 kg    Intake/Output:   Intake/Output Summary (Last 24 hours) at 11/09/2020 0946 Last data filed at 11/09/2020 0721 Gross per 24 hour  Intake 749.25 ml  Output 2000 ml  Net -1250.75 ml        Physical Exam   General:  Sitting up on side of bed No resp difficulty HEENT: normal Neck: supple. JVP to jaw Carotids 2+ bilat; no bruits. No lymphadenopathy or thryomegaly appreciated. Cor: PMI nondisplaced. Regular rate & rhythm. No rubs, gallops or murmurs. Lungs: clear Abdomen: obese soft, nontender, nondistended. No hepatosplenomegaly. No bruits or masses. Good bowel sounds. Extremities: no cyanosis, clubbing, rash, tr-1+ edema Neuro: alert & orientedx3, cranial nerves grossly intact. moves all 4 extremities w/o difficulty. Affect pleasant   Telemetry   Sinus 90-100. Personally checked.   Labs    CBC Recent Labs    11/08/20 0436 11/09/20 0449  WBC 7.2 7.4  HGB 14.9 14.8  HCT 43.9 43.7  MCV 90.5 90.1  PLT 209 224    Basic Metabolic Panel Recent Labs    21/19/41 0550 11/08/20 0436 11/09/20 0455  NA 128* 125* 128*  K 4.2 4.3 3.9  CL 89* 88* 91*  CO2 26 25 26   GLUCOSE 212* 290* 181*  BUN 36* 45* 47*  CREATININE 2.11* 2.23* 1.99*  CALCIUM 9.5 9.1 9.0  MG 2.3  --   --     Liver Function Tests Recent Labs    11/08/20 0436  AST 21  ALT 25  ALKPHOS 98  BILITOT 1.3*  PROT 7.4  ALBUMIN 3.0*     No results for input(s):  LIPASE, AMYLASE in the last 72 hours. Cardiac Enzymes No results for input(s): CKTOTAL, CKMB, CKMBINDEX, TROPONINI in the last 72 hours.  BNP: BNP (last 3 results) Recent Labs    05/01/20 1020 09/11/20 0951 10/28/20 1315  BNP 862.1* 803.3* 1,547.7*     ProBNP (last 3 results) No results for input(s): PROBNP in the last 8760 hours.   D-Dimer No results for input(s): DDIMER in the last 72 hours. Hemoglobin A1C No results for input(s): HGBA1C in the last 72 hours. Fasting Lipid Panel No results for input(s): CHOL, HDL, LDLCALC, TRIG, CHOLHDL, LDLDIRECT in the last 72 hours. Thyroid Function Tests No results for input(s): TSH, T4TOTAL, T3FREE, THYROIDAB in the last 72 hours.  Invalid input(s): FREET3  Other  results:   Imaging    No results found.   Medications:     Scheduled Medications:  apixaban  5 mg Oral BID   Chlorhexidine Gluconate Cloth  6 each Topical Daily   digoxin  0.125 mg Oral Daily   insulin aspart  0-15 Units Subcutaneous TID WC   insulin aspart  15 Units Subcutaneous TID WC   insulin glargine-yfgn  30 Units Subcutaneous BID   loratadine  10 mg Oral Daily   melatonin  3 mg Oral QHS   potassium chloride  40 mEq Oral BID   sodium chloride flush  10-40 mL Intracatheter Q12H   sodium chloride flush  3 mL Intravenous Q12H   spironolactone  25 mg Oral Daily   torsemide  80 mg Oral BID    Infusions:  amiodarone 30 mg/hr (11/09/20 0721)   milrinone 0.125 mcg/kg/min (11/08/20 1408)    PRN Medications: acetaminophen **OR** acetaminophen, alum & mag hydroxide-simeth, camphor-menthol, hydrOXYzine, influenza vac split quadrivalent PF, sodium chloride flush  Assessment/Plan   A/C HFrEF, NICM  -Most recent ECHO back in 2021 EF 30-35 % with moderate RV dysfunction in the setting of recurrent RBBB with significant dyssynchrony. Echo this admit with EF < 20% and moderate RV dysfunction - Admitted with marked volume overload w/ low output.  - CO-OX 41% on milrinone 0.125 - CVP 24 Overall weight down 54 pounds. SCr stable today - On torsemide 80 bid - Intolerant SGLT2i due to candidiasis +uncontrolled DM - Continue spiro 25 mg daily - Continue digoxin 0.125. Dig level 0.5  - Off ARNI due to low BP/AKI - No beta blocker d/t low output - Not candidate for VAD or transplant currently due to size and Hgba1c > 14   - Remains tenuous. Co-ox very low on milrinone 0.125. Recheck co-ox  -> 43% - He has failed milrinone wean. I discussed with him and his wife that he is clearly inotrope dependent. They want to proceed with trial of home inotropes but they understand the benefit may not be sustained. Palliative Care following. Will begin to arrange for home inotropes in am - Restart  IV lasix at 120 IV bid   2. Uncontrolled DM -9/1 Hgb A1C > 14.  -Per primary team.    3. AKI - Due to ATN/cardiorenal. Creatinine baseline ~ 1.6.  - SCr 2.1->2.2 -> 2.0 -Continue torsemide 80 bid  4. Atrial Flutter  - c/w amio gtt while on milrinone - Remains in sinus - Continue eliquis 5 mg twice a day.  - Hgb stable   5. Hypervolemic Hyponatremia  - Na 128 -> 125 -> 128 - Restrict free water.  - Continue to monitor - can consider tolvaptan as needed   7. OSA: Severe OSA AHI 65 -  Bipap -needs to use nightly.    8. Morbid Obesity  Body mass index is 46 kg/m.  9. H/O medical noncompliance -Reports taking HF medications prior to admit. Missed last few clinic f/u and discharged from HF paramedicine d/t noncompliance.  -Consider HF paramedicine at discharge if willing  10. Hypokalemia/ Hypomagnesemia - K 3.9    11. NSVT  - Watch K and Mag.  - Keep K > 4 and Mag > 2  12. GOC Palliative Care following for GOC discussions. He is now DNR. Palliative Care referral for home . See discussion above   Length of Stay: 12  Arvilla Meres, MD  11/09/2020, 9:46 AM  Advanced Heart Failure Team Pager [phone] (M-F; 7a - 5p)  Please contact [facility] for night-coverage after hours (5p -7a ) and weekends on [email or web address]

## 2020-11-09 NOTE — Progress Notes (Signed)
Patient refused CPAP tonight 

## 2020-11-10 DIAGNOSIS — E669 Obesity, unspecified: Secondary | ICD-10-CM | POA: Diagnosis not present

## 2020-11-10 DIAGNOSIS — I10 Essential (primary) hypertension: Secondary | ICD-10-CM | POA: Diagnosis not present

## 2020-11-10 DIAGNOSIS — I509 Heart failure, unspecified: Secondary | ICD-10-CM | POA: Diagnosis not present

## 2020-11-10 DIAGNOSIS — Z515 Encounter for palliative care: Secondary | ICD-10-CM | POA: Diagnosis not present

## 2020-11-10 DIAGNOSIS — I5023 Acute on chronic systolic (congestive) heart failure: Secondary | ICD-10-CM | POA: Diagnosis not present

## 2020-11-10 DIAGNOSIS — Z7189 Other specified counseling: Secondary | ICD-10-CM | POA: Diagnosis not present

## 2020-11-10 DIAGNOSIS — E1169 Type 2 diabetes mellitus with other specified complication: Secondary | ICD-10-CM | POA: Diagnosis not present

## 2020-11-10 LAB — COOXEMETRY PANEL
Carboxyhemoglobin: 0.8 % (ref 0.5–1.5)
Carboxyhemoglobin: 1 % (ref 0.5–1.5)
Methemoglobin: 0.7 % (ref 0.0–1.5)
Methemoglobin: 0.6 % (ref 0.0–1.5)
O2 Saturation: 43.4 %
O2 Saturation: 53.3 %
Total hemoglobin: 15.3 g/dL (ref 12.0–16.0)
Total hemoglobin: 15.2 g/dL (ref 12.0–16.0)

## 2020-11-10 LAB — RENAL FUNCTION PANEL
Albumin: 2.9 g/dL — ABNORMAL LOW (ref 3.5–5.0)
Anion gap: 11 (ref 5–15)
BUN: 47 mg/dL — ABNORMAL HIGH (ref 6–20)
CO2: 25 mmol/L (ref 22–32)
Calcium: 9 mg/dL (ref 8.9–10.3)
Chloride: 91 mmol/L — ABNORMAL LOW (ref 98–111)
Creatinine, Ser: 1.95 mg/dL — ABNORMAL HIGH (ref 0.61–1.24)
GFR, Estimated: 45 mL/min — ABNORMAL LOW (ref >60)
Glucose, Bld: 194 mg/dL — ABNORMAL HIGH (ref 70–99)
Phosphorus: 4.8 mg/dL — ABNORMAL HIGH (ref 2.5–4.6)
Potassium: 3.7 mmol/L (ref 3.5–5.1)
Sodium: 127 mmol/L — ABNORMAL LOW (ref 135–145)

## 2020-11-10 LAB — COMPREHENSIVE METABOLIC PANEL
ALT: 25 U/L (ref 0–44)
AST: 23 U/L (ref 15–41)
Albumin: 3.2 g/dL — ABNORMAL LOW (ref 3.5–5.0)
Alkaline Phosphatase: 110 U/L (ref 38–126)
Anion gap: 13 (ref 5–15)
BUN: 51 mg/dL — ABNORMAL HIGH (ref 6–20)
CO2: 26 mmol/L (ref 22–32)
Calcium: 9.2 mg/dL (ref 8.9–10.3)
Chloride: 89 mmol/L — ABNORMAL LOW (ref 98–111)
Creatinine, Ser: 1.99 mg/dL — ABNORMAL HIGH (ref 0.61–1.24)
GFR, Estimated: 44 mL/min — ABNORMAL LOW (ref >60)
Glucose, Bld: 178 mg/dL — ABNORMAL HIGH (ref 70–99)
Potassium: 3.9 mmol/L (ref 3.5–5.1)
Sodium: 128 mmol/L — ABNORMAL LOW (ref 135–145)
Total Bilirubin: 1.8 mg/dL — ABNORMAL HIGH (ref 0.3–1.2)
Total Protein: 7.9 g/dL (ref 6.5–8.1)

## 2020-11-10 LAB — GLUCOSE, CAPILLARY
Glucose-Capillary: 148 mg/dL — ABNORMAL HIGH (ref 70–99)
Glucose-Capillary: 206 mg/dL — ABNORMAL HIGH (ref 70–99)
Glucose-Capillary: 216 mg/dL — ABNORMAL HIGH (ref 70–99)
Glucose-Capillary: 195 mg/dL — ABNORMAL HIGH (ref 70–99)

## 2020-11-10 LAB — CBC
HCT: 44.7 % (ref 39.0–52.0)
Hemoglobin: 14.9 g/dL (ref 13.0–17.0)
MCH: 30.2 pg (ref 26.0–34.0)
MCHC: 33.3 g/dL (ref 30.0–36.0)
MCV: 90.5 fL (ref 80.0–100.0)
Platelets: 246 10*3/uL (ref 150–400)
RBC: 4.94 MIL/uL (ref 4.22–5.81)
RDW: 14.3 % (ref 11.5–15.5)
WBC: 7.3 10*3/uL (ref 4.0–10.5)
nRBC: 0 % (ref 0.0–0.2)

## 2020-11-10 LAB — BASIC METABOLIC PANEL
Anion gap: 12 (ref 5–15)
BUN: 49 mg/dL — ABNORMAL HIGH (ref 6–20)
CO2: 27 mmol/L (ref 22–32)
Calcium: 9.4 mg/dL (ref 8.9–10.3)
Chloride: 90 mmol/L — ABNORMAL LOW (ref 98–111)
Creatinine, Ser: 2.16 mg/dL — ABNORMAL HIGH (ref 0.61–1.24)
GFR, Estimated: 40 mL/min — ABNORMAL LOW (ref >60)
Glucose, Bld: 216 mg/dL — ABNORMAL HIGH (ref 70–99)
Potassium: 3.9 mmol/L (ref 3.5–5.1)
Sodium: 129 mmol/L — ABNORMAL LOW (ref 135–145)

## 2020-11-10 LAB — MAGNESIUM
Magnesium: 2.4 mg/dL (ref 1.7–2.4)
Magnesium: 2.2 mg/dL (ref 1.7–2.4)

## 2020-11-10 MED ORDER — GABAPENTIN 300 MG PO CAPS
300.0000 mg | ORAL_CAPSULE | Freq: Once | ORAL | Status: AC
  Administered 2020-11-10: 300 mg via ORAL
  Filled 2020-11-10: qty 1

## 2020-11-10 MED ORDER — LIDOCAINE 5 % EX PTCH
1.0000 | MEDICATED_PATCH | CUTANEOUS | Status: DC
  Administered 2020-11-10 – 2020-11-11 (×2): 1 via TRANSDERMAL
  Filled 2020-11-10 (×2): qty 1

## 2020-11-10 MED ORDER — DULAGLUTIDE 1.5 MG/0.5ML ~~LOC~~ SOAJ
1.5000 mg | SUBCUTANEOUS | Status: DC
  Administered 2020-11-11: 1.5 mg via SUBCUTANEOUS
  Filled 2020-11-10: qty 0.5

## 2020-11-10 MED ORDER — POTASSIUM CHLORIDE CRYS ER 20 MEQ PO TBCR
40.0000 meq | EXTENDED_RELEASE_TABLET | Freq: Once | ORAL | Status: AC
  Administered 2020-11-10: 40 meq via ORAL
  Filled 2020-11-10: qty 2

## 2020-11-10 MED ORDER — AMIODARONE HCL 200 MG PO TABS
200.0000 mg | ORAL_TABLET | Freq: Two times a day (BID) | ORAL | Status: DC
  Administered 2020-11-10 – 2020-11-12 (×5): 200 mg via ORAL
  Filled 2020-11-10 (×5): qty 1

## 2020-11-10 MED ORDER — METOLAZONE 2.5 MG PO TABS
2.5000 mg | ORAL_TABLET | Freq: Once | ORAL | Status: AC
  Administered 2020-11-10: 2.5 mg via ORAL
  Filled 2020-11-10: qty 1

## 2020-11-10 NOTE — Consult Note (Signed)
   [facility] CM Inpatient Consult   11/10/2020  Jonathan [name] Nov 29, 1985 [id number]  Managed Medicaid  Primary Care Provider:  Dolan Amen, MD [facility] Internal Medicine, Embedded  Patient is active with the Managed Medicaid chronic care management team.  Plan:  Will update the MM CCM team of any transitional needs for post hospital care. Patient being followed by Advanced HF team currently.  For questions,  Charlesetta Shanks, RN BSN CCM [facility]  [phone] business mobile phone Toll free office [phone]  Fax number: [phone] [email or web address]

## 2020-11-10 NOTE — Progress Notes (Addendum)
Patient ID: Jonathan [name], male   DOB: 1985/06/05, 35 y.o.   MRN: [id number]     Advanced Heart Failure Rounding Note  PCP-Cardiologist: Armanda Magic, MD  HF MD: Dr. Gala Romney   Patient Profile   Jonathan [name] is a 35 year old male with history of HFrEF, NICM, HTN, asthma, OSA, morbid obesity, and uncontrolled DM. Hx of medical noncompliance. Admitted on 09/13 with acute on chronic systolic HF, severe hyperglycemia, AKI and abdominal pain.    Subjective:    09/13: Started on Empiric milrinone 0.25, lasix gtt at 10/hr 09/14: Developed AFL w/ RVR>>amio gtt started  9/21: Milrinone increased 0.375 mcg.  9/22: Lasix drip and diamox stopped. Switched to torsemide.  09/25: Co-ox dropped with attempting to wean milrinone. CVP up, Torsemide switched to IV lasix.    CO-OX 43% on 0.25 milrinone  CVP 25. On IV lasix 120 mg BID. Also had po Torsemide 80 mg po BID yesterday. Neg 1.6 L last 24 hrs. No weight loss, but down > 50 lb since admit.  Feels like he is retaining more fluid again. Denies dyspnea at rest. No orthopnea last night. Denies CP.  Scr trending down 2.23 > 1.99 > 1.95. K 3.7, Mag 2.4.   BP okay    Cardiac Studies   Echo 09/22: EF < 20%, RV fxn moderately reduced, trivial MR, mild TR Echo 10/21: EF 30-35% with moderate RV dysfunction in setting of recurrent RBBB with significant dyssynchrony.  ECHO 04/2019: EF 30-35%  ECHO 2019: EF 55%   Echo EF 15-20% in [place] in 2017. Cath around that time without any significant CAD per patient.  Objective:   Weight Range: (!) 162.7 kg Body mass index is 46.05 kg/m.   Vital Signs:   Temp:  [97.9 F (36.6 C)-98.5 F (36.9 C)] 97.9 F (36.6 C) (09/26 0303) Pulse Rate:  [99-104] 99 (09/26 0303) Resp:  [18-19] 19 (09/26 0400) BP: (117-140)/(88-97) 129/97 (09/26 0303) SpO2:  [96 %-98 %] 96 % (09/26 0303) Weight:  [162.7 kg] 162.7 kg (09/26 0256) Last BM Date: 11/09/20  Weight change: Filed Weights   11/08/20 0020 11/09/20 0455  11/10/20 0256  Weight: (!) 162.3 kg (!) 162.5 kg (!) 162.7 kg    Intake/Output:   Intake/Output Summary (Last 24 hours) at 11/10/2020 0900 Last data filed at 11/10/2020 0559 Gross per 24 hour  Intake 929.43 ml  Output 2575 ml  Net -1645.57 ml      Physical Exam   General:  Well appearing [name]. No resp difficulty HEENT: normal Neck: JVD to jaw. Carotids 2+ bilat; no bruits. No lymphadenopathy or thryomegaly appreciated. Cor: PMI nondisplaced. Regular rhythm, tachy. No rubs, gallops or murmurs. Lungs: clear Abdomen: soft, nontender, + slightly distended. No hepatosplenomegaly. No bruits or masses. Good bowel sounds. Extremities: no cyanosis, clubbing, rash, 2+ lower extremity edema Neuro: alert & orientedx3, cranial nerves grossly intact. moves all 4 extremities w/o difficulty. Affect pleasant    Telemetry   Sinus tachy, low 100s   Labs    CBC Recent Labs    11/09/20 0449 11/10/20 0430  WBC 7.4 7.3  HGB 14.8 14.9  HCT 43.7 44.7  MCV 90.1 90.5  PLT 224 246   Basic Metabolic Panel Recent Labs    04/88/89 0455 11/10/20 0431  NA 128* 127*  K 3.9 3.7  CL 91* 91*  CO2 26 25  GLUCOSE 181* 194*  BUN 47* 47*  CREATININE 1.99* 1.95*  CALCIUM 9.0 9.0  MG  --  2.4  PHOS  --  4.8*   Liver Function Tests Recent Labs    11/08/20 0436 11/10/20 0431  AST 21  --   ALT 25  --   ALKPHOS 98  --   BILITOT 1.3*  --   PROT 7.4  --   ALBUMIN 3.0* 2.9*    No results for input(s): LIPASE, AMYLASE in the last 72 hours. Cardiac Enzymes No results for input(s): CKTOTAL, CKMB, CKMBINDEX, TROPONINI in the last 72 hours.  BNP: BNP (last 3 results) Recent Labs    05/01/20 1020 09/11/20 0951 10/28/20 1315  BNP 862.1* 803.3* 1,547.7*    ProBNP (last 3 results) No results for input(s): PROBNP in the last 8760 hours.   D-Dimer No results for input(s): DDIMER in the last 72 hours. Hemoglobin A1C No results for input(s): HGBA1C in the last 72 hours. Fasting Lipid  Panel No results for input(s): CHOL, HDL, LDLCALC, TRIG, CHOLHDL, LDLDIRECT in the last 72 hours. Thyroid Function Tests No results for input(s): TSH, T4TOTAL, T3FREE, THYROIDAB in the last 72 hours.  Invalid input(s): FREET3  Other results:   Imaging    No results found.   Medications:     Scheduled Medications:  apixaban  5 mg Oral BID   Chlorhexidine Gluconate Cloth  6 each Topical Daily   digoxin  0.125 mg Oral Daily   insulin aspart  0-15 Units Subcutaneous TID WC   insulin aspart  20 Units Subcutaneous TID WC   insulin glargine-yfgn  30 Units Subcutaneous BID   loratadine  10 mg Oral Daily   melatonin  3 mg Oral QHS   potassium chloride  40 mEq Oral BID   sodium chloride flush  10-40 mL Intracatheter Q12H   sodium chloride flush  3 mL Intravenous Q12H   spironolactone  25 mg Oral Daily   torsemide  80 mg Oral BID    Infusions:  amiodarone 30 mg/hr (11/10/20 0710)   furosemide 120 mg (11/09/20 1802)   milrinone 0.25 mcg/kg/min (11/10/20 0717)    PRN Medications: acetaminophen **OR** acetaminophen, alum & mag hydroxide-simeth, camphor-menthol, hydrOXYzine, influenza vac split quadrivalent PF, sodium chloride flush  Assessment/Plan   A/C HFrEF, NICM  -Most recent ECHO back in 2021 EF 30-35 % with moderate RV dysfunction in the setting of recurrent RBBB with significant dyssynchrony. Echo this admit with EF < 20% and moderate RV dysfunction - Admitted with marked volume overload w/ low output.  - Failed milrinone wean. Co-ox very low on 0.125. Increased back to 0.25 on 09/25. Co-ox this am 43%. Rechecking.  - CVP 25. Volume up. On IV lasix 120 mg BID. Less diuresis than hoped for overnight. Will give 2.5 mg metolazone once. - Intolerant SGLT2i due to candidiasis +uncontrolled DM - Continue spiro 25 mg daily - Continue digoxin 0.125. Dig level 0.5  - Off ARNI due to low BP/AKI - No beta blocker d/t low output - Not candidate for VAD or transplant currently due  to size and Hgba1c > 14   - He has failed milrinone wean. Dr. Gala Romney discussed with him and his wife that he is clearly inotrope dependent. They want to proceed with trial of home inotropes but they understand the benefit may not be sustained. Palliative Care following. Will begin to arrange for home inotropes.   2. Uncontrolled DM -9/1 Hgb A1C > 14.  -Per primary team.    3. AKI - Due to ATN/cardiorenal. Creatinine baseline ~ 1.6.  - SCr 2.1->2.2 -> 2.0 -> 1.95 -Continue  torsemide 80 bid  4. Atrial Flutter  - Remains in sinus. Will switch IV amio to po 200 mg BID - Continue eliquis 5 mg twice a day.  - Hgb stable   5. Hypervolemic Hyponatremia  - Na 128 -> 125 -> 128 -> 127 - Restrict free water.  - Continue to monitor - can consider tolvaptan as needed   7. OSA: Severe OSA AHI 65 -  Bipap -needs to use nightly.    8. Morbid Obesity  Body mass index is 46.05 kg/m.  9. H/O medical noncompliance -Reports taking HF medications prior to admit. Missed last few clinic f/u and discharged from HF paramedicine d/t noncompliance.  -Consider HF paramedicine at discharge if willing  10. Hypokalemia/ Hypomagnesemia - K 3.7/ Mag 2.4 - Replace K aggressively with diuresis  11. NSVT  - Watch K and Mag.  - Keep K > 4 and Mag > 2  12. GOC Palliative Care following for GOC discussions. He is now DNR. Palliative Care referral for home . See discussion above   Length of Stay: 13  FINCH, LINDSAY N, PA-C  11/10/2020, 9:00 AM  Advanced Heart Failure Team Pager [phone] (M-F; 7a - 5p)  Please contact [facility] for night-coverage after hours (5p -7a ) and weekends on [email or web address]  Patient seen and examined with the above-signed Advanced Practice Provider and/or Housestaff. I personally reviewed laboratory data, imaging studies and relevant notes. I independently examined the patient and formulated the important aspects of the plan. I have edited the note to reflect any of my  changes or salient points. I have personally discussed the plan with the patient and/or family.  Remains very tenuous. Co-ox low on milrinone 0.25. CVP climbing back up > 20 suggestive of severe RV failure. Feels better as he is back on milrinone.   General:  Sitting up on side of bed  No resp difficulty HEENT: normal Neck: supple. JVP to ear . Carotids 2+ bilat; no bruits. No lymphadenopathy or thryomegaly appreciated. Cor: PMI nondisplaced. Regular rate & rhythm. No rubs, gallops or murmurs. Lungs: clear Abdomen: obese  soft, nontender, nondistended. No hepatosplenomegaly. No bruits or masses. Good bowel sounds. Extremities: no cyanosis, clubbing, rash,tr  edema Neuro: alert & orientedx3, cranial nerves grossly intact. moves all 4 extremities w/o difficulty. Affect pleasant  He has end-stage HF and is now clearly inotrope dependent. Unfortunately not candidate for transplant or VAD. We will plan home inotropes but he and his family are aware that the benefit may be transient. Will arrange with [facility]. Continue diuresis. Would like to get CVP < 15 prior to d/c if possible.   Arvilla Meres, MD  11:48 AM

## 2020-11-10 NOTE — TOC CM/SW Note (Signed)
HF TOC CM contacted [facility], rep Tim and will follow up with wife to arrange DME. Made aware possible dc tomorrow with IV Milrinone. Isidoro Donning RN3 CCM, Heart Failure TOC CM [phone]

## 2020-11-10 NOTE — TOC CM/SW Note (Addendum)
4:25 pm TOC CM spoke [facility] rep, Tim and they can do a start of care for 11/11/2020. Updated attending. Isidoro Donning RN3 CCM, Heart Failure TOC CM [phone]   3:10 pm HF TOC CM spoke to pt's and requested CM contact wife. Spoke to pt's wife and updated on [facility] and [facility] working to arrange IV Milrinone for home. HF TOC CM received orders to arrange Home IV Milrinone. Contacted [facility] rep, Jeri Modena. She will follow up with [facility] rep, Tim to arrange. [facility] will work family to arrange needed DME for home.    Isidoro Donning RN3 CCM, Heart Failure TOC CM [phone]

## 2020-11-10 NOTE — Progress Notes (Signed)
Patient complaining of cramping pain in his hands as well as his flank. Went to bedside to assess. Patient sitting on the side of the bed in no acute distress. He says that the pain comes in his fingers and will last for a couple of hours and then it will go away. He also gets pain in his side/back when this happens. Denies any trouble breathing, shortness of breath, or palpitations during these episodes. He says that this has happened to him in the past at home too, citing that it happens when they have him take too much torsemide and pee a lot. When this happens at home he will stop taking his torsemide for a couple of days. He has never tried any medications for this before or talked to a physician about it. He says that when it happens he will get pain in his fingers and it feels like someone is squeezing them. He does not describe the pain as a pins and needles or burning sensation but does endorse that he has neuropathy at baseline. He is not sure whether this could be related. He is still able to move his fingers and bend them during the episodes but it is really painful. Sometimes the pain will move from his fingers and go to his back/side which makes him get up out of bed and feel like he needs to move around. The moving around makes the pain a bit more bearable. On exam, the patient does not have any pain to palpation of his fingers, wrists, or hands. No swelling, erythema, or warmth noted. Patient has intact sensation. On palpation of his side, patient has reproducible pain over his left midaxillary region with pressure. Due to concern for some electrolyte imbalance, ordered a BMP and a Mg. Mg was normal and K was 3.9, Ca corrected was WNL. Suspect patient's pain may be multifactorial in nature, may have component of neuropathy causing the pain in his hands and a muscle spasm in his side causing the flank pain. Patient's CBC was unremarkable and patient's vital signs were stable. No new medications were  started prior to the onset of this pain which appears to be acute on chronic in nature. Will order a one time dose of gabapentin and assess patient's response to pain in the morning as well as a lidocaine patch for his side. If patient does not have a good response to the gabapentin can consider trying a muscle relaxer.

## 2020-11-10 NOTE — Progress Notes (Addendum)
HD#13 SUBJECTIVE:  Patient Summary: Jonathan [name] is a 35 y.o. with a pertinent PMH of HFrEF, uncontrolled insulin-naive type 2 diabetes, hypertension, OSA, who presented with shortness of breath and abdominal pain and admitted for heart failure exacerbation complicated by uncontrolled type 2 diabetes mellitus.   Overnight Events: None  Interim History: Patient evaluated at bedside.  He reports that he is feeling fine this morning, emphasizes that he is ready to leave.  He has no complaints at this time.  OBJECTIVE:  Vital Signs: Vitals:   11/10/20 0009 11/10/20 0256 11/10/20 0303 11/10/20 0400  BP: 117/88  (!) 129/97   Pulse: (!) 101  99   Resp:   19 19  Temp: 98.1 F (36.7 C)  97.9 F (36.6 C)   TempSrc: Oral  Oral   SpO2: 98%  96%   Weight:  (!) 162.7 kg    Height:       Supplemental O2: Room Air SpO2: 96 %  Filed Weights   11/08/20 0020 11/09/20 0455 11/10/20 0256  Weight: (!) 162.3 kg (!) 162.5 kg (!) 162.7 kg     Intake/Output Summary (Last 24 hours) at 11/10/2020 1301 Last data filed at 11/10/2020 0900 Gross per 24 hour  Intake 558.21 ml  Output 3075 ml  Net -2516.79 ml   Net IO Since Admission: -31,822.77 mL [11/10/20 1301]  Physical Exam: Constitutional: Patient sitting on side of hospital bed.  No acute distress noted. Cardio: Tachycardia with regular rhythm.  No murmurs, rubs, gallops. Pulm: Clear to auscultation bilaterally.  Normal work of breathing on room air. Abdomen: Soft, nontender, nondistended. MSK: 2+ pitting edema to bilateral lower extremities up to knees with nonpitting edema to level of umbilicus. Skin: Skin is cool and dry. Neuro: Alert and oriented x3.  No focal deficit noted. Psych: Normal mood and affect.  Patient Lines/Drains/Airways Status     Active Line/Drains/Airways     Name Placement date Placement time Site Days   PICC Double Lumen 10/28/20 PICC Right Cephalic 39 cm 0 cm 10/28/20  0865  -- 13              ASSESSMENT/PLAN:  Assessment: Active Problems:   Diabetes mellitus type 2 in obese (HCC)   Essential hypertension   OSA (obstructive sleep apnea)   Acute on chronic heart failure (HCC)   Goals of care, counseling/discussion   Diabetes mellitus (HCC)   Plan: #Acute on chronic biventricular, low output failure Co-ox testing today showed total hemoglobin 15.3, carboxyhemoglobin 0.8, methemoglobin 0.7, O2 saturations 43.4.  This is in the setting of milrinone 0.25 mcg.  Prior to discharge, CVP goal is less than 15 if possible. -Advanced heart failure team consulted, appreciate their recommendations             -Continue milrinone to 0.25 mcg  -Continue Lasix 120 mg IV twice daily             -Continue digoxin 0.125 mg daily.               -amiodarone 200 mg p.o. twice daily             -Continue spironolactone 25 mg daily             -Continue Eliquis 5 mg daily -K goal greater than 4, Mg goal greater than 2.  Replete as necessary. -Trend BMP -CVP monitoring -Palliative care consulted, appreciate their assistance -TOC HF team arranging for home IV milrinone, discharge is possible once this is set  up.    #Aflutter In the setting of milrinone. Patient currently in sinus tachycardia.  -Continue digoxin, and Eliquis as above -Transition from IV amiodarone to p.o. amiodarone 200 mg twice daily   #Uncontrolled type 2 diabetes mellitus Patient continues to have persistently elevated blood sugars during admission.  -Continue scheduled insulin: 20 units 3 times daily in addition to sliding scale insulin at mealtime. -Continue Semglee 30 units twice daily -CBG monitoring -Patient reports being on Trulicity 1.5 mg once weekly at home.  He has been asked to bring in this medication from home as we do not have it inpatient.   #Cardiorenal syndrome Patient with AKI likely due to to low output heart failure.  BUN/creatinine of on morning labs at 47/1.95.  He is currently on milrinone  0.25. -Trend BMP -Avoid nephrotoxic agents -Strict I's/O's -Continue milrinone as above.   #HTN #HLD Historically treated for hypertension with carvedilol 25 mg twice daily, Entresto 97-103 mg twice daily, spironolactone 25 mg daily. -Continue spironolactone 25 mg daily -Consider restarting Entresto during this admission   #OSA -BiPAP   #Hypervolemic hyponatremia Hyponatremia persists, down to 125 on morning labs.  -Trend BMP -Consider tolvaptan as needed -Restrict free water  Best Practice: Diet: Cardiac diet IVF: Fluids: None VTE:   Eliquis 5 mg daily Code: DNR AB: None Therapy Recs: Home Health, DME: other 3 and 1 DISPO: Continue to monitor inpatient.  Signature: Champ Mungo, D.O.  Internal Medicine Resident, PGY-1 [facility]  Pager: [phone] 1:01 PM, 11/10/2020   Please contact the on call pager after 5 pm and on weekends at [phone].    Date: 11/10/2020  Patient name: Jonathan [name]  Medical record number: [id number]  Date of birth: Jul 12, 1985        I have seen and evaluated this patient and I have discussed the plan of care with the house staff. Please see Dr. Diamantina Providence above note for complete details. I concur with her findings and plan.   Inez Catalina, MD 11/10/2020, 3:31 PM

## 2020-11-10 NOTE — Progress Notes (Signed)
Occupational Therapy Treatment and Discharge Patient Details Name: Jonathan [name] MRN: [id number] DOB: 02-11-1986 Today's Date: 11/10/2020   History of present illness Pt is a 35 y.o. male admitted 10/28/20 with worsening SOB, abdominal pain, BLE edema. Workup for acute CHF exacerbation, AKI, severe hyperglycemia. Pt developed aflutter w/ RVR on 9/14. PMH includes HF, DM, HTN, OSA, obesity.   OT comments  All goals met. Pt is functioning modified independently in toileting, standing grooming and is knowledgeable in energy conservation strategies and use of 3 in 1 which has been sent home with wife. Wife will continue to assist pt with LB ADL, not a goal area.    Recommendations for follow up therapy are one component of a multi-disciplinary discharge planning process, led by the attending physician.  Recommendations may be updated based on patient status, additional functional criteria and insurance authorization.    Follow Up Recommendations  No OT follow up    Equipment Recommendations  None recommended by OT    Recommendations for Other Services      Precautions / Restrictions Precautions Precautions: Fall Required Braces or Orthoses: Other Brace Other Brace: uses boot on R LE when having more pain from Charcot foot       Mobility Bed Mobility Overal bed mobility: Modified Independent                  Transfers Overall transfer level: Modified independent Equipment used: None                  Balance Overall balance assessment: Needs assistance   Sitting balance-Leahy Scale: Good       Standing balance-Leahy Scale: Fair                             ADL either performed or assessed with clinical judgement   ADL Overall ADL's : Needs assistance/impaired     Grooming: Standing;Modified independent                   Toilet Transfer: Ambulation;Modified Independent   Toileting- Clothing Manipulation and Hygiene: Modified  independent;Sit to/from stand       Functional mobility during ADLs: Supervision/safety       Vision       Perception     Praxis      Cognition Arousal/Alertness: Awake/alert Behavior During Therapy: WFL for tasks assessed/performed;Flat affect Overall Cognitive Status: Within Functional Limits for tasks assessed                                          Exercises     Shoulder Instructions       General Comments      Pertinent Vitals/ Pain       Pain Assessment: Faces Faces Pain Scale: Hurts little more Pain Location: upper back and neck, L foot Pain Descriptors / Indicators: Sore;Tightness Pain Intervention(s): Repositioned  Home Living                                          Prior Functioning/Environment              Frequency           Progress Toward Goals  OT Goals(current goals can now be  found in the care plan section)  Progress towards OT goals: Goals met/education completed, patient discharged from OT  Acute Rehab OT Goals Patient Stated Goal: to get better and go home OT Goal Formulation: With patient Time For Goal Achievement: 11/13/20 Potential to Achieve Goals: Good  Plan All goals met and education completed, patient discharged from OT services    Co-evaluation                 AM-PAC OT "6 Clicks" Daily Activity     Outcome Measure   Help from another person eating meals?: None Help from another person taking care of personal grooming?: A Little Help from another person toileting, which includes using toliet, bedpan, or urinal?: A Little Help from another person bathing (including washing, rinsing, drying)?: A Lot Help from another person to put on and taking off regular upper body clothing?: None Help from another person to put on and taking off regular lower body clothing?: A Lot 6 Click Score: 18    End of Session    OT Visit Diagnosis: Pain;Unsteadiness on feet (R26.81);Other  abnormalities of gait and mobility (R26.89)   Activity Tolerance Patient tolerated treatment well   Patient Left in bed;with call bell/phone within reach   Nurse Communication          Time: 1674-2552 OT Time Calculation (min): 19 min  Charges: OT General Charges $OT Visit: 1 Visit OT Treatments $Self Care/Home Management : 8-22 mins Nestor Lewandowsky, OTR/L Acute Rehabilitation Services Pager: [phone] Office: [phone]   Malka So 11/10/2020, 3:16 PM

## 2020-11-10 NOTE — Progress Notes (Signed)
Mobility Specialist Progress Note:   11/10/20 1100  Mobility  Activity Ambulated in hall  Range of Motion/Exercises Active;All extremities  Level of Assistance Standby assist, set-up cues, supervision of patient - no hands on  Assistive Device None  Minutes Ambulated 5 minutes  Distance Ambulated (ft) 300 ft  Mobility Ambulated with assistance in hallway  Mobility Response Tolerated well  Mobility performed by Mobility specialist  Transport method Ambulatory  $Mobility charge 1 Mobility   Pre Mobility: 108 bpm  During Mobility: HR 120 bpm Post Mobility:  HR 110 bpm  Pt received at EOB and willing to participate in mobility. Ambulated in hallway at supervision. Pt was asx throughout mobility and was returned to EOB with call bell and all needs met.   [facility] and safety inspector Phone [phone]

## 2020-11-11 ENCOUNTER — Ambulatory Visit (INDEPENDENT_AMBULATORY_CARE_PROVIDER_SITE_OTHER): Payer: Medicaid Other | Admitting: Family Medicine

## 2020-11-11 DIAGNOSIS — Z515 Encounter for palliative care: Secondary | ICD-10-CM | POA: Diagnosis not present

## 2020-11-11 DIAGNOSIS — I130 Hypertensive heart and chronic kidney disease with heart failure and stage 1 through stage 4 chronic kidney disease, or unspecified chronic kidney disease: Secondary | ICD-10-CM | POA: Diagnosis not present

## 2020-11-11 DIAGNOSIS — E872 Acidosis: Secondary | ICD-10-CM | POA: Diagnosis not present

## 2020-11-11 DIAGNOSIS — I5082 Biventricular heart failure: Secondary | ICD-10-CM | POA: Diagnosis not present

## 2020-11-11 DIAGNOSIS — Z6841 Body Mass Index (BMI) 40.0 and over, adult: Secondary | ICD-10-CM | POA: Diagnosis not present

## 2020-11-11 DIAGNOSIS — E871 Hypo-osmolality and hyponatremia: Secondary | ICD-10-CM | POA: Diagnosis not present

## 2020-11-11 DIAGNOSIS — E669 Obesity, unspecified: Secondary | ICD-10-CM | POA: Diagnosis not present

## 2020-11-11 DIAGNOSIS — I10 Essential (primary) hypertension: Secondary | ICD-10-CM | POA: Diagnosis not present

## 2020-11-11 DIAGNOSIS — I472 Ventricular tachycardia: Secondary | ICD-10-CM | POA: Diagnosis not present

## 2020-11-11 DIAGNOSIS — B379 Candidiasis, unspecified: Secondary | ICD-10-CM | POA: Diagnosis not present

## 2020-11-11 DIAGNOSIS — Z7189 Other specified counseling: Secondary | ICD-10-CM | POA: Diagnosis not present

## 2020-11-11 DIAGNOSIS — N170 Acute kidney failure with tubular necrosis: Secondary | ICD-10-CM | POA: Diagnosis not present

## 2020-11-11 DIAGNOSIS — E1169 Type 2 diabetes mellitus with other specified complication: Secondary | ICD-10-CM | POA: Diagnosis not present

## 2020-11-11 DIAGNOSIS — Z66 Do not resuscitate: Secondary | ICD-10-CM | POA: Diagnosis not present

## 2020-11-11 DIAGNOSIS — Z20822 Contact with and (suspected) exposure to covid-19: Secondary | ICD-10-CM | POA: Diagnosis not present

## 2020-11-11 DIAGNOSIS — I5084 End stage heart failure: Secondary | ICD-10-CM | POA: Diagnosis not present

## 2020-11-11 DIAGNOSIS — I5023 Acute on chronic systolic (congestive) heart failure: Secondary | ICD-10-CM | POA: Diagnosis not present

## 2020-11-11 DIAGNOSIS — I509 Heart failure, unspecified: Secondary | ICD-10-CM | POA: Diagnosis not present

## 2020-11-11 LAB — BASIC METABOLIC PANEL
Anion gap: 11 (ref 5–15)
Anion gap: 10 (ref 5–15)
BUN: 45 mg/dL — ABNORMAL HIGH (ref 6–20)
BUN: 45 mg/dL — ABNORMAL HIGH (ref 6–20)
CO2: 27 mmol/L (ref 22–32)
CO2: 27 mmol/L (ref 22–32)
Calcium: 9.1 mg/dL (ref 8.9–10.3)
Calcium: 9.3 mg/dL (ref 8.9–10.3)
Chloride: 89 mmol/L — ABNORMAL LOW (ref 98–111)
Chloride: 90 mmol/L — ABNORMAL LOW (ref 98–111)
Creatinine, Ser: 1.7 mg/dL — ABNORMAL HIGH (ref 0.61–1.24)
Creatinine, Ser: 1.63 mg/dL — ABNORMAL HIGH (ref 0.61–1.24)
GFR, Estimated: 53 mL/min — ABNORMAL LOW (ref >60)
GFR, Estimated: 56 mL/min — ABNORMAL LOW (ref >60)
Glucose, Bld: 243 mg/dL — ABNORMAL HIGH (ref 70–99)
Glucose, Bld: 139 mg/dL — ABNORMAL HIGH (ref 70–99)
Potassium: 3.4 mmol/L — ABNORMAL LOW (ref 3.5–5.1)
Potassium: 3.9 mmol/L (ref 3.5–5.1)
Sodium: 127 mmol/L — ABNORMAL LOW (ref 135–145)
Sodium: 127 mmol/L — ABNORMAL LOW (ref 135–145)

## 2020-11-11 LAB — GLUCOSE, CAPILLARY
Glucose-Capillary: 138 mg/dL — ABNORMAL HIGH (ref 70–99)
Glucose-Capillary: 142 mg/dL — ABNORMAL HIGH (ref 70–99)
Glucose-Capillary: 119 mg/dL — ABNORMAL HIGH (ref 70–99)
Glucose-Capillary: 165 mg/dL — ABNORMAL HIGH (ref 70–99)

## 2020-11-11 LAB — COOXEMETRY PANEL
Carboxyhemoglobin: 1.3 % (ref 0.5–1.5)
Methemoglobin: 0.7 % (ref 0.0–1.5)
O2 Saturation: 51.7 %
Total hemoglobin: 14.9 g/dL (ref 12.0–16.0)

## 2020-11-11 LAB — CBC
HCT: 42.3 % (ref 39.0–52.0)
Hemoglobin: 14.3 g/dL (ref 13.0–17.0)
MCH: 30.4 pg (ref 26.0–34.0)
MCHC: 33.8 g/dL (ref 30.0–36.0)
MCV: 89.8 fL (ref 80.0–100.0)
Platelets: 241 10*3/uL (ref 150–400)
RBC: 4.71 MIL/uL (ref 4.22–5.81)
RDW: 13.8 % (ref 11.5–15.5)
WBC: 6.6 10*3/uL (ref 4.0–10.5)
nRBC: 0 % (ref 0.0–0.2)

## 2020-11-11 MED ORDER — POTASSIUM CHLORIDE CRYS ER 20 MEQ PO TBCR
60.0000 meq | EXTENDED_RELEASE_TABLET | Freq: Two times a day (BID) | ORAL | Status: DC
  Administered 2020-11-11 – 2020-11-12 (×3): 60 meq via ORAL
  Filled 2020-11-11 (×3): qty 3

## 2020-11-11 MED ORDER — ISOSORB DINITRATE-HYDRALAZINE 20-37.5 MG PO TABS
0.5000 | ORAL_TABLET | Freq: Three times a day (TID) | ORAL | Status: DC
  Administered 2020-11-11 – 2020-11-12 (×4): 0.5 via ORAL
  Filled 2020-11-11 (×4): qty 1

## 2020-11-11 MED ORDER — METHOCARBAMOL 500 MG PO TABS
1000.0000 mg | ORAL_TABLET | Freq: Three times a day (TID) | ORAL | Status: DC | PRN
  Administered 2020-11-11: 1000 mg via ORAL
  Filled 2020-11-11: qty 2

## 2020-11-11 MED ORDER — TORSEMIDE 100 MG PO TABS
100.0000 mg | ORAL_TABLET | Freq: Two times a day (BID) | ORAL | Status: DC
  Administered 2020-11-11 – 2020-11-12 (×3): 100 mg via ORAL
  Filled 2020-11-11 (×3): qty 1

## 2020-11-11 MED ORDER — METOLAZONE 2.5 MG PO TABS: 2.5000 mg | ORAL_TABLET | ORAL | Status: DC

## 2020-11-11 MED ORDER — POTASSIUM CHLORIDE CRYS ER 20 MEQ PO TBCR
40.0000 meq | EXTENDED_RELEASE_TABLET | Freq: Once | ORAL | Status: AC
  Administered 2020-11-11: 40 meq via ORAL
  Filled 2020-11-11: qty 2

## 2020-11-11 NOTE — Progress Notes (Signed)
HD#14 SUBJECTIVE:  Patient Summary: Jonathan [name] is a 35 y.o. with a pertinent PMH of HFrEF, uncontrolled insulin-naive type 2 diabetes, hypertension, OSA, who presented with shortness of breath and abdominal pain and admitted for heart failure exacerbation complicated by uncontrolled type 2 diabetes mellitus.   Overnight Events: Patient complained of cramping in his hands and flank.  He received a dose of gabapentin and lidocaine patch for side.  Interim History: Patient reports feeling about the same today.  He had significant urine output with IV diuresis yesterday.  He remains eager to discharge and go home.  OBJECTIVE:  Vital Signs: Vitals:   11/11/20 0405 11/11/20 0813 11/11/20 0821 11/11/20 0823  BP:  123/78 (!) 143/106   Pulse:  (!) 109  (!) 111  Resp: 18 17 16    Temp:  98 F (36.7 C)    TempSrc:  Oral    SpO2:  100%    Weight:      Height:       Supplemental O2: Room Air SpO2: 100 %  Filed Weights   11/09/20 0455 11/10/20 0256 11/11/20 0342  Weight: (!) 162.5 kg (!) 162.7 kg (!) 161.7 kg     Intake/Output Summary (Last 24 hours) at 11/11/2020 1013 Last data filed at 11/11/2020 0858 Gross per 24 hour  Intake 480 ml  Output 4450 ml  Net -3970 ml   Net IO Since Admission: -35,792.77 mL [11/11/20 1013]  Physical Exam: Constitutional:Patient is sitting in bedside chair conversing with his wife. No acute distress. Cardio:Tachycardia with regular rhythm. No murmurs, rubs, gallops. Pulm:Clear to auscultation bilaterally. Normal work of breathing on room air. Abdomen:Soft, nontender, nondistended. Non pitting edema to umbilicus. MSK:2+ pitting edema bilateral lower extremities to knees with 1+pitting edema to midthigh and nonpitting edema to umbilicus. Skin: Skin is cool and dry. Neuro:Alert and oriented x 3. No focal deficit noted. Psych:Normal mood and affect.  Patient Lines/Drains/Airways Status     Active Line/Drains/Airways     Name Placement date  Placement time Site Days   PICC Double Lumen 10/28/20 PICC Right Cephalic 39 cm 0 cm 10/28/20  10/30/20  -- 14             ASSESSMENT/PLAN:  Assessment: Active Problems:   Diabetes mellitus type 2 in obese (HCC)   Essential hypertension   OSA (obstructive sleep apnea)   Acute on chronic heart failure (HCC)   Goals of care, counseling/discussion   Diabetes mellitus (HCC)   Plan: #Acute on chronic biventricular, low output failure Co-ox testing today showed total hemoglobin 14.9, carboxyhemoglobin 1.3, methemoglobin 0.7, O2 saturations 51.7.  This is in the setting of milrinone 0.25 mcg.  Prior to discharge, CVP goal is less than 15 if possible. -Advanced heart failure team consulted, appreciate their recommendations             -Increase milrinone to 0.375 mcg             -Discontinue Lasix 120 mg IV twice daily  -Start torsemide 100 mg p.o. twice daily  -Start metolazone 2.5 mg on Monday, Wednesday, Friday.             -Continue digoxin 0.125 mg daily.               -Amiodarone 200 mg p.o. twice daily             -Continue spironolactone 25 mg daily             -Continue Eliquis 5 mg daily -  K goal greater than 4, Mg goal greater than 2.  Replete as necessary. -Trend BMP -CVP monitoring -Palliative care, TOC CM/SW consulted, appreciate their assistance   #Aflutter In the setting of milrinone. Patient currently in sinus tachycardia.  -Continue digoxin, and Eliquis as above -Amiodarone 200 mg p.o. twice daily   #Uncontrolled type 2 diabetes mellitus Patient continues to have persistently elevated blood sugars during admission.  -Continue scheduled insulin: 20 units 3 times daily in addition to sliding scale insulin at mealtime. -Continue Semglee 30 units twice daily -CBG monitoring -Patient reports being on Trulicity 1.5 mg once weekly at home.  He has been asked to bring in this medication from home as we do not have it inpatient however this has not been pending.    #Cardiorenal syndrome Patient with AKI likely due to to low output heart failure.  BUN/creatinine of on morning labs at 45/1.70. -Trend BMP -Avoid nephrotoxic agents -Strict I's/O's -Continue milrinone as above.   #HTN #HLD Historically treated for hypertension with carvedilol 25 mg twice daily, Entresto 97-103 mg twice daily, spironolactone 25 mg daily. -Continue spironolactone 25 mg daily  #OSA -BiPAP   #Hypervolemic hyponatremia Hyponatremia persists, down to 127 on morning labs.  -Trend BMP -Consider tolvaptan as needed -Restrict free water  Best Practice: Diet: Cardiac diet IVF: Fluids: None VTE: Eliquis 5 mg daily Code: DNR AB: None Therapy Recs: Home Health, DME: other 3 in 1 DISPO: Anticipated discharge tomorrow to Home pending  volume status tolerance s/p IV diuretic discontinuance .  Signature: Champ Mungo, D.O.  Internal Medicine Resident, PGY-1 [facility]  Pager: [phone] 10:13 AM, 11/11/2020   Please contact the on call pager after 5 pm and on weekends at [phone].

## 2020-11-11 NOTE — Progress Notes (Signed)
Mobility Specialist Progress Note:   11/11/20 1030  Therapy Vitals  Pulse Rate (!) 115  Mobility  Activity Ambulated in hall  Range of Motion/Exercises Active;All extremities  Level of Assistance Independent  Assistive Device None  Minutes Ambulated 6 minutes  Distance Ambulated (ft) 565 ft  Mobility Ambulated independently in hallway  Mobility Response Tolerated fair  Mobility performed by Mobility specialist  Bed Position Chair  Transport method Ambulatory  $Mobility charge 1 Mobility   Pre Mobility: HR 115 bpm During Mobility: HR 133 bpm Post Mobility: HR 115 bpm  Pt received in chair, eager for mobility. Ambulated in hall 565' with multiple standing breaks due to SOB. Pt otherwise asx and very motivated. Left in chair with all needs met and wife present.   Nelta Numbers Mobility Specialist  Phone [phone]

## 2020-11-11 NOTE — Progress Notes (Addendum)
Patient ID: Jonathan [name], male   DOB: Jun 18, 1985, 35 y.o.   MRN: [id number]     Advanced Heart Failure Rounding Note  PCP-Cardiologist: Armanda Magic, MD  HF MD: Dr. Gala Romney   Patient Profile   Jonathan [name] is a 35 year old male with history of HFrEF, NICM, HTN, asthma, OSA, morbid obesity, and uncontrolled DM. Hx of medical noncompliance. Admitted on 09/13 with acute on chronic systolic HF, severe hyperglycemia, AKI and abdominal pain.    Subjective:    09/13: Started on Empiric milrinone 0.25, lasix gtt at 10/hr 09/14: Developed AFL w/ RVR>>amio gtt started  9/21: Milrinone increased 0.375 mcg.  9/22: Lasix drip and diamox stopped. Switched to torsemide.  09/25: Co-ox dropped to 43% with attempting to wean milrinone. CVP up, Torsemide switched to IV lasix.  09/26: Milrinone back up to 0.375  CO-OX 52% on 0.375 mcg/kg/min milrinone  CVP 10. -4.5L yesterday with 120 mg lasix IV BID + 2.5 mg metolazone. Weight down 2 lb, overall 54 lb from admit  Scr improved to 1.70. K 3.4. Mag 2.2.   BP okay. No dyspnea overnight. Resting comfortably.  Sinus tachy   Cardiac Studies   Echo 09/22: EF < 20%, RV fxn moderately reduced, trivial MR, mild TR Echo 10/21: EF 30-35% with moderate RV dysfunction in setting of recurrent RBBB with significant dyssynchrony.  ECHO 04/2019: EF 30-35%  ECHO 2019: EF 55%   Echo EF 15-20% in [place] in 2017. Cath around that time without any significant CAD per patient.  Objective:   Weight Range: (!) 161.7 kg Body mass index is 45.76 kg/m.   Vital Signs:   Temp:  [98 F (36.7 C)-98.7 F (37.1 C)] 98 F (36.7 C) (09/27 0345) Pulse Rate:  [105-109] 105 (09/27 0345) Resp:  [16-20] 18 (09/27 0405) BP: (129-146)/(76-99) 135/76 (09/27 0345) SpO2:  [96 %-97 %] 97 % (09/27 0345) Weight:  [161.7 kg] 161.7 kg (09/27 0342) Last BM Date: 11/09/20  Weight change: Filed Weights   11/09/20 0455 11/10/20 0256 11/11/20 0342  Weight: (!) 162.5 kg (!) 162.7 kg (!)  161.7 kg    Intake/Output:   Intake/Output Summary (Last 24 hours) at 11/11/2020 0730 Last data filed at 11/11/2020 0343 Gross per 24 hour  Intake 240 ml  Output 4450 ml  Net -4210 ml      Physical Exam   General:  [name] in no acute distress, lying comfortably in bed HEENT: normal Neck: supple. no JVD. Carotids 2+ bilat; no bruits. No lymphadenopathy or thryomegaly appreciated. Cor: PMI nondisplaced. No JVD. Regular rhythm, tachycardic. No rubs, gallops or murmurs. Lungs: clear anteriorly Abdomen: obese, nontender, nondistended. No hepatosplenomegaly. No bruits or masses. Good bowel sounds. Extremities: no cyanosis, clubbing, rash, trace edema. + RUE PICC  Neuro: alert & orientedx3, cranial nerves grossly intact. moves all 4 extremities w/o difficulty. Affect pleasant     Telemetry   Sinus tach 100s, occasional PVCs (personally reviewed)  Labs    CBC Recent Labs    11/10/20 0430 11/11/20 0520  WBC 7.3 6.6  HGB 14.9 14.3  HCT 44.7 42.3  MCV 90.5 89.8  PLT 246 241   Basic Metabolic Panel Recent Labs    72/09/47 0431 11/10/20 1700 11/10/20 2200 11/11/20 0520  NA 127*   < > 128* 127*  K 3.7   < > 3.9 3.4*  CL 91*   < > 89* 89*  CO2 25   < > 26 27  GLUCOSE 194*   < > 178*  243*  BUN 47*   < > 51* 45*  CREATININE 1.95*   < > 1.99* 1.70*  CALCIUM 9.0   < > 9.2 9.1  MG 2.4  --  2.2  --   PHOS 4.8*  --   --   --    < > = values in this interval not displayed.   Liver Function Tests Recent Labs    11/10/20 0431 11/10/20 2200  AST  --  23  ALT  --  25  ALKPHOS  --  110  BILITOT  --  1.8*  PROT  --  7.9  ALBUMIN 2.9* 3.2*    No results for input(s): LIPASE, AMYLASE in the last 72 hours. Cardiac Enzymes No results for input(s): CKTOTAL, CKMB, CKMBINDEX, TROPONINI in the last 72 hours.  BNP: BNP (last 3 results) Recent Labs    05/01/20 1020 09/11/20 0951 10/28/20 1315  BNP 862.1* 803.3* 1,547.7*    ProBNP (last 3 results) No results for  input(s): PROBNP in the last 8760 hours.   D-Dimer No results for input(s): DDIMER in the last 72 hours. Hemoglobin A1C No results for input(s): HGBA1C in the last 72 hours. Fasting Lipid Panel No results for input(s): CHOL, HDL, LDLCALC, TRIG, CHOLHDL, LDLDIRECT in the last 72 hours. Thyroid Function Tests No results for input(s): TSH, T4TOTAL, T3FREE, THYROIDAB in the last 72 hours.  Invalid input(s): FREET3  Other results:   Imaging    No results found.   Medications:     Scheduled Medications:  amiodarone  200 mg Oral BID   apixaban  5 mg Oral BID   Chlorhexidine Gluconate Cloth  6 each Topical Daily   digoxin  0.125 mg Oral Daily   Dulaglutide  1.5 mg Subcutaneous Weekly   insulin aspart  0-15 Units Subcutaneous TID WC   insulin aspart  20 Units Subcutaneous TID WC   insulin glargine-yfgn  30 Units Subcutaneous BID   lidocaine  1 patch Transdermal Q24H   loratadine  10 mg Oral Daily   melatonin  3 mg Oral QHS   potassium chloride  40 mEq Oral BID   sodium chloride flush  10-40 mL Intracatheter Q12H   sodium chloride flush  3 mL Intravenous Q12H   spironolactone  25 mg Oral Daily    Infusions:  furosemide 120 mg (11/10/20 1757)   milrinone 0.375 mcg/kg/min (11/11/20 0537)    PRN Medications: acetaminophen **OR** acetaminophen, alum & mag hydroxide-simeth, camphor-menthol, hydrOXYzine, influenza vac split quadrivalent PF, sodium chloride flush  Assessment/Plan   A/C HFrEF with low output, NICM  -Most recent ECHO back in 2021 EF 30-35 % with moderate RV dysfunction in the setting of recurrent RBBB with significant dyssynchrony. Echo this admit with EF < 20% and moderate RV dysfunction - Admitted with marked volume overload w/ low output.  - Failed milrinone wean. Co-ox very low on 0.125 with increase in CVP > 20 suggestive of severe RV failure. Increased back to 0.375 mcg/kg/min. Co-ox marginal, 52% this am. - CVP 10. Volume status looks okay. Stop IV  lasix. Start Torsemide 100 mg BID + 2.5 mg metolazone M, W, F. Replace K. BMET this afternoon. - Intolerant SGLT2i due to candidiasis +uncontrolled DM - Continue spiro 25 mg daily - Continue digoxin 0.125. Dig level 0.5  - Off ARNI due to low BP/AKI - No beta blocker d/t low output - Not candidate for VAD or transplant currently due to size and Hgba1c > 14   - He has  failed milrinone wean. Discussed with patient and his wife that he is clearly inotrope dependent. They want to proceed with trial of home inotropes but they understand the benefit may not be sustained. Palliative Care following. Have arranged for [facility] mil[facility]one with [facility] and [facility]. Appreciate assistance from [facility] CM/SW.   2. Uncontrolled DM -9/1 Hgb A1C > 14.  -Per primary team.    3. AKI - Due to ATN/cardiorenal. Creatinine baseline ~ 1.6.  - SCr 2.1->2.2 -> 2.0 -> 1.95 -> 1.70 - Some improvement overnight with increasing ionotrope and diuresing  4. Atrial Flutter  - Remains in sinus.  - IV amio switched to po 200 mg BID - Continue eliquis 5 mg twice a day.  - Hgb stable   5. Hypervolemic Hyponatremia  - Na 128 -> 125 -> 128 -> 127  - Restrict free water.  - Continue to monitor - can consider tolvaptan as needed   7. OSA: Severe OSA AHI 65 -  Bipap -needs to use nightly.    8. Morbid Obesity  Body mass index is 45.76 kg/m.  9. Hypokalemia/ Hypomagnesemia - K 3.4/ Mag 2.2 - Replace K aggressively with diuresis  10. NSVT  - Watch K and Mag.  - Keep K > 4 and Mag > 2  11. GOC Palliative Care following for GOC discussions. He is now DNR. Palliative Care referral for home . See discussion above  12. H/O medical noncompliance -Reports taking HF medications prior to admit. Missed last few clinic f/u and discharged from HF paramedicine d/t noncompliance.    DISCHARGE PLANNING: -Anticipate possible discharge home tomorrow if volume status remains stable off IV diuretic -Will discharge  home with HH/Hospice for home ionotropes.  -Home PT/OT  Length of Stay: 14  FINCH, LINDSAY N, PA-C  11/11/2020, 7:30 AM  Advanced Heart Failure Team Pager [phone] (M-F; 7a - 5p)  Please contact [facility] for night-coverage after hours (5p -7a ) and weekends on [email or web address]  Patient seen and examined with the above-signed Advanced Practice Provider and/or Housestaff. I personally reviewed laboratory data, imaging studies and relevant notes. I independently examined the patient and formulated the important aspects of the plan. I have edited the note to reflect any of my changes or salient points. I have personally discussed the plan with the patient and/or family.  He has failed milrinone wean. Now back on milrinone at 0.375 and IV lasix. Co-ox remains marginal. CVP 13  General:  Sitting in chair. No resp difficulty HEENT: normal Neck: supple. JVP to jaw  Carotids 2+ bilat; no bruits. No lymphadenopathy or thryomegaly appreciated. Cor: PMI nondisplaced. Regular tachy + s3 Lungs: clear Abdomen: obese soft, nontender, nondistended. No hepatosplenomegaly. No bruits or masses. Good bowel sounds. Extremities: no cyanosis, clubbing, rash, edema Neuro: alert & orientedx3, cranial nerves grossly intact. moves all 4 extremities w/o difficulty. Affect pleasant  He has end-stage HF. Now inotrope dependent. Plan probable d/c home tomorrow with IV milrinone and high-dose diuretics. Long talk with him and his wife that we are not sure how long inotrope therapy will support him. We will arrange home inotropes. Will send home on torsemide 100 bid and metolazone 2.5 MWF.  Arvilla Meres, MD  10:57 AM

## 2020-11-11 NOTE — Plan of Care (Signed)
  Problem: Education: Goal: Ability to demonstrate management of disease process will improve Outcome: Progressing Goal: Ability to verbalize understanding of medication therapies will improve Outcome: Progressing   Problem: Cardiac: Goal: Ability to achieve and maintain adequate cardiopulmonary perfusion will improve Outcome: Progressing   Problem: Health Behavior/Discharge Planning: Goal: Ability to manage health-related needs will improve Outcome: Progressing

## 2020-11-11 NOTE — Progress Notes (Signed)
Patient complain of head, neck, and shoulder pain. Pain scale of 8/10. Patient states it is not new pain at all. He states it feels like a migraine and upon RN assessment, patient is alert and oriented x4. Paged MD team and state they will round on the patient.

## 2020-11-11 NOTE — Progress Notes (Signed)
Physical Therapy Treatment Patient Details Name: Jonathan [name] MRN: [id number] DOB: [birth year] Today's Date: 11/11/2020   History of Present Illness Pt is a 35 y.o. male admitted 10/28/20 with worsening SOB, abdominal pain, BLE edema. Workup for acute CHF exacerbation, AKI, severe hyperglycemia. Pt developed aflutter w/ RVR on 9/14. PMH includes HF, DM, HTN, OSA, obesity.    PT Comments    Patient received sitting up in bed, reports he was sleeping but needs to use the bathroom. Patient agrees to PT session. Is mod independent with bed mobility and transfers. Ambulated with supervision, no AD 250 feet. Patient will continue to benefit from skilled PT while here to improve endurance and strength.     Recommendations for follow up therapy are one component of a multi-disciplinary discharge planning process, led by the attending physician.  Recommendations may be updated based on patient status, additional functional criteria and insurance authorization.  Follow Up Recommendations  Home health PT;Supervision for mobility/OOB     Equipment Recommendations  Other (comment) (bariatric RW)    Recommendations for Other Services       Precautions / Restrictions Precautions Precaution Comments: mod fall Other Brace: uses boot on R LE when having more pain from Charcot foot Restrictions Weight Bearing Restrictions: No     Mobility  Bed Mobility Overal bed mobility: Modified Independent Bed Mobility: Supine to Sit     Supine to sit: Modified independent (Device/Increase time)     General bed mobility comments: observed patient going from supine to sit as entering room. States he needs to go to the bathroom.    Transfers Overall transfer level: Modified independent Equipment used: None   Sit to Stand: Modified independent (Device/Increase time)         General transfer comment: Increased time and effort, supervision for safety/lines standing from EOB and chair in front of sink;  reliant on momentum to power up  Ambulation/Gait Ambulation/Gait assistance: Supervision Gait Distance (Feet): 250 Feet Assistive device: None Gait Pattern/deviations: Step-through pattern;Decreased step length - right;Decreased step length - left;Wide base of support Gait velocity: reduced   General Gait Details: pt with slowed step-through gait, widened BOS, HR up to 130s with ambulation.   Stairs             Wheelchair Mobility    Modified Rankin (Stroke Patients Only)       Balance Overall balance assessment: Modified Independent Sitting-balance support: Feet supported Sitting balance-Leahy Scale: Normal     Standing balance support: No upper extremity supported;During functional activity Standing balance-Leahy Scale: Good Standing balance comment: ambulatory without UE support, guarded; likely unable to accept significant challenge                            Cognition Arousal/Alertness: Awake/alert Behavior During Therapy: WFL for tasks assessed/performed Overall Cognitive Status: Within Functional Limits for tasks assessed                                        Exercises      General Comments        Pertinent Vitals/Pain Pain Assessment: No/denies pain Pain Intervention(s): Monitored during session    Home Living                      Prior Function  PT Goals (current goals can now be found in the care plan section) Acute Rehab PT Goals Patient Stated Goal: to get better and go home PT Goal Formulation: With patient Time For Goal Achievement: 11/13/20 Potential to Achieve Goals: Good Progress towards PT goals: Progressing toward goals    Frequency    Min 3X/week      PT Plan Current plan remains appropriate    Co-evaluation              AM-PAC PT "6 Clicks" Mobility   Outcome Measure  Help needed turning from your back to your side while in a flat bed without using bedrails?:  None Help needed moving from lying on your back to sitting on the side of a flat bed without using bedrails?: None Help needed moving to and from a bed to a chair (including a wheelchair)?: None Help needed standing up from a chair using your arms (e.g., wheelchair or bedside chair)?: None Help needed to walk in hospital room?: None Help needed climbing 3-5 steps with a railing? : A Lot 6 Click Score: 22    End of Session   Activity Tolerance: Patient tolerated treatment well Patient left: with call bell/phone within reach;in bed Nurse Communication: Mobility status PT Visit Diagnosis: Unsteadiness on feet (R26.81);Muscle weakness (generalized) (M62.81);History of falling (Z91.81);Difficulty in walking, not elsewhere classified (R26.2)     Time: 9741-6384 PT Time Calculation (min) (ACUTE ONLY): 18 min  Charges:  $Gait Training: 8-22 mins                     Shelley Pooley, PT, GCS 11/11/20,1:52 PM

## 2020-11-11 NOTE — Progress Notes (Signed)
Received page stating that patient was complaining of head, neck, shoulder pain and that nursing may have seen his eyes rolled back.  Evaluated the patient at bedside where he appeared uncomfortable.  He had placed a blanket on his head, stating that this helped with the symptoms sometimes.  He stated that the pain has been present since he was admitted to the hospital, but today it is worse.  It spans bilateral shoulders, radiating up into his neck and over his head.  He does not have light or sound sensitivity at this time.  He does state that he has taken Tylenol for the pain previously and it did help some.  He denies chest pain, shortness of breath.  He does say that his vision may be a little bit blurry but that it came with a headache.  He did not seem concerned about this.  Constitutional: Patient sitting on side of bed.  Looks visibly uncomfortable. Cardio: Tachycardic. Pulm: Clear to auscultation bilaterally.  Normal work of breathing on room air. Neuro: Mental Status: Patient is awake, alert, oriented x3 No signs of aphasia or neglect Cranial Nerves: II: Pupils equal, round, and reactive to light.   III,IV, VI: EOMI without ptosis or diploplia.  V: Facial sensation is symmetric to light touch and  Temperature. VII: Facial movement is symmetric.  VIII: hearing is intact to voice X: Uvula elevates symmetrically XI: Shoulder shrug is symmetric. XII: tongue is midline without atrophy or fasciculations.  Motor: Equal effort thorughout, at Least 5/5 bilateral UE Sensory: Sensation is grossly intact bilateral UEs & LEs Psych: Normal mood and affect.  Reviewed patient's telemetry monitor during physical exam.  Twelve-lead EKG ordered to assess cardiac rhythm Robaxin 1000 mg 3 times daily as needed muscle spasms  Champ Mungo, DO IMTS PGY-1

## 2020-11-11 NOTE — TOC CM/SW Note (Signed)
HF TOC CM received confirmation from [facility] rep, Tim that pt was approved for Home Hospice with IV Milronone. DME has been ordered and wife at home to receive. Scheduled dc 11/12/2020.  Isidoro Donning RN3 CCM, Heart Failure TOC CM [phone]

## 2020-11-11 NOTE — Progress Notes (Signed)
Patient's 1300 BMP resulted a NA of 128. Paged HF-Cardiology and notified Amy to make aware.

## 2020-11-12 ENCOUNTER — Other Ambulatory Visit (HOSPITAL_COMMUNITY): Payer: Self-pay

## 2020-11-12 DIAGNOSIS — I10 Essential (primary) hypertension: Secondary | ICD-10-CM | POA: Diagnosis not present

## 2020-11-12 DIAGNOSIS — Z7189 Other specified counseling: Secondary | ICD-10-CM | POA: Diagnosis not present

## 2020-11-12 DIAGNOSIS — G4733 Obstructive sleep apnea (adult) (pediatric): Secondary | ICD-10-CM | POA: Diagnosis not present

## 2020-11-12 DIAGNOSIS — I509 Heart failure, unspecified: Secondary | ICD-10-CM | POA: Diagnosis not present

## 2020-11-12 DIAGNOSIS — F32A Depression, unspecified: Secondary | ICD-10-CM | POA: Diagnosis not present

## 2020-11-12 DIAGNOSIS — E119 Type 2 diabetes mellitus without complications: Secondary | ICD-10-CM | POA: Diagnosis not present

## 2020-11-12 DIAGNOSIS — E669 Obesity, unspecified: Secondary | ICD-10-CM | POA: Diagnosis not present

## 2020-11-12 DIAGNOSIS — R109 Unspecified abdominal pain: Secondary | ICD-10-CM | POA: Diagnosis not present

## 2020-11-12 DIAGNOSIS — E1169 Type 2 diabetes mellitus with other specified complication: Secondary | ICD-10-CM | POA: Diagnosis not present

## 2020-11-12 DIAGNOSIS — I5023 Acute on chronic systolic (congestive) heart failure: Secondary | ICD-10-CM | POA: Diagnosis not present

## 2020-11-12 DIAGNOSIS — Z515 Encounter for palliative care: Secondary | ICD-10-CM | POA: Diagnosis not present

## 2020-11-12 LAB — CBC
HCT: 44.3 % (ref 39.0–52.0)
Hemoglobin: 15.2 g/dL (ref 13.0–17.0)
MCH: 30.6 pg (ref 26.0–34.0)
MCHC: 34.3 g/dL (ref 30.0–36.0)
MCV: 89.1 fL (ref 80.0–100.0)
Platelets: 252 10*3/uL (ref 150–400)
RBC: 4.97 MIL/uL (ref 4.22–5.81)
RDW: 13.8 % (ref 11.5–15.5)
WBC: 8.8 10*3/uL (ref 4.0–10.5)
nRBC: 0 % (ref 0.0–0.2)

## 2020-11-12 LAB — BASIC METABOLIC PANEL
Anion gap: 11 (ref 5–15)
BUN: 40 mg/dL — ABNORMAL HIGH (ref 6–20)
CO2: 30 mmol/L (ref 22–32)
Calcium: 9.3 mg/dL (ref 8.9–10.3)
Chloride: 91 mmol/L — ABNORMAL LOW (ref 98–111)
Creatinine, Ser: 1.63 mg/dL — ABNORMAL HIGH (ref 0.61–1.24)
GFR, Estimated: 56 mL/min — ABNORMAL LOW (ref >60)
Glucose, Bld: 104 mg/dL — ABNORMAL HIGH (ref 70–99)
Potassium: 3.5 mmol/L (ref 3.5–5.1)
Sodium: 132 mmol/L — ABNORMAL LOW (ref 135–145)

## 2020-11-12 LAB — GLUCOSE, CAPILLARY
Glucose-Capillary: 135 mg/dL — ABNORMAL HIGH (ref 70–99)
Glucose-Capillary: 120 mg/dL — ABNORMAL HIGH (ref 70–99)

## 2020-11-12 LAB — COOXEMETRY PANEL
Carboxyhemoglobin: 2 % — ABNORMAL HIGH (ref 0.5–1.5)
Methemoglobin: 0.7 % (ref 0.0–1.5)
O2 Saturation: 56.6 %
Total hemoglobin: 15.4 g/dL (ref 12.0–16.0)

## 2020-11-12 LAB — MAGNESIUM: Magnesium: 2.1 mg/dL (ref 1.7–2.4)

## 2020-11-12 MED ORDER — INSULIN GLARGINE 100 UNIT/ML ~~LOC~~ SOLN
30.0000 [IU] | Freq: Two times a day (BID) | SUBCUTANEOUS | 11 refills | Status: AC
  Filled 2020-11-12: qty 20, 30d supply, fill #0

## 2020-11-12 MED ORDER — INSULIN ASPART 100 UNIT/ML IJ SOLN
20.0000 [IU] | Freq: Three times a day (TID) | INTRAMUSCULAR | 11 refills | Status: AC
  Filled 2020-11-12: qty 20, 30d supply, fill #0

## 2020-11-12 MED ORDER — ISOSORB DINITRATE-HYDRALAZINE 20-37.5 MG PO TABS
1.0000 | ORAL_TABLET | Freq: Three times a day (TID) | ORAL | 2 refills | Status: DC
  Filled 2020-11-12: qty 90, 30d supply, fill #0

## 2020-11-12 MED ORDER — "INSULIN SYRINGE-NEEDLE U-100 30G X 1/2"" 1 ML MISC"
2 refills | Status: AC
  Filled 2020-11-12: qty 100, 25d supply, fill #0

## 2020-11-12 MED ORDER — TORSEMIDE 20 MG PO TABS: 80.0000 mg | ORAL_TABLET | Freq: Two times a day (BID) | ORAL | Status: DC

## 2020-11-12 MED ORDER — POTASSIUM CHLORIDE CRYS ER 20 MEQ PO TBCR
60.0000 meq | EXTENDED_RELEASE_TABLET | Freq: Two times a day (BID) | ORAL | 2 refills | Status: DC
  Filled 2020-11-12: qty 150, 30d supply, fill #0

## 2020-11-12 MED ORDER — TORSEMIDE 20 MG PO TABS
80.0000 mg | ORAL_TABLET | Freq: Two times a day (BID) | ORAL | 2 refills | Status: DC
  Filled 2020-11-12: qty 120, 30d supply, fill #0

## 2020-11-12 MED ORDER — ISOSORB DINITRATE-HYDRALAZINE 20-37.5 MG PO TABS: 1.0000 | ORAL_TABLET | Freq: Three times a day (TID) | ORAL | Status: DC

## 2020-11-12 MED ORDER — POTASSIUM CHLORIDE CRYS ER 20 MEQ PO TBCR
40.0000 meq | EXTENDED_RELEASE_TABLET | Freq: Once | ORAL | Status: AC
  Administered 2020-11-12: 40 meq via ORAL
  Filled 2020-11-12: qty 2

## 2020-11-12 MED ORDER — METOLAZONE 2.5 MG PO TABS
2.5000 mg | ORAL_TABLET | Freq: Every day | ORAL | 2 refills | Status: DC
  Filled 2020-11-12: qty 30, 30d supply, fill #0

## 2020-11-12 MED ORDER — APIXABAN 5 MG PO TABS
5.0000 mg | ORAL_TABLET | Freq: Two times a day (BID) | ORAL | 2 refills | Status: AC
  Filled 2020-11-12: qty 60, 30d supply, fill #0

## 2020-11-12 MED ORDER — AMIODARONE HCL 200 MG PO TABS
200.0000 mg | ORAL_TABLET | Freq: Two times a day (BID) | ORAL | 2 refills | Status: DC
  Filled 2020-11-12: qty 60, 30d supply, fill #0

## 2020-11-12 MED ORDER — MILRINONE LACTATE IN DEXTROSE 20-5 MG/100ML-% IV SOLN: 0.3750 ug/kg/min | INTRAVENOUS | Status: DC

## 2020-11-12 MED ORDER — INFLUENZA VAC SPLIT QUAD 0.5 ML IM SUSY
0.5000 mL | PREFILLED_SYRINGE | Freq: Once | INTRAMUSCULAR | 0 refills | Status: AC
  Filled 2020-11-12: qty 0.5, 1d supply, fill #0

## 2020-11-12 MED ORDER — METOLAZONE 2.5 MG PO TABS: 2.5000 mg | ORAL_TABLET | ORAL | Status: DC

## 2020-11-12 NOTE — Progress Notes (Signed)
PT had about a 20 beat run of vtach. PT was asleep. MD notified. No new orders given. States to call back if it becomes more frequent or patient becomes symptomatic.

## 2020-11-12 NOTE — TOC CM/SW Note (Signed)
HF TOC CM spoke to pt's wife, and DME has been delivered. IV Milrinone has been set up by [employer] rep, Jeri Modena. Pt has received meds from [facility] pharmacy. Contacted [facility] rep, Jorja Loa to confirm admission by [facility] RN. Admission RN will be at the home at 6 pm. States they will continue his meds as prescribed. Explained insurance only covered 2 weeks of Torsemide due to large dose. Spoke to [facility] pharmacy and pt was provided insulin needles. PTAR arranged.    Isidoro Donning RN3 CCM, Heart Failure TOC CM [phone]

## 2020-11-12 NOTE — Progress Notes (Signed)
Mobility Specialist Progress Note:   11/12/20 0910  Therapy Vitals  Pulse Rate (!) 114  Mobility  Activity Ambulated in hall  Range of Motion/Exercises Active;All extremities  Level of Assistance Independent  Assistive Device None  Minutes Ambulated 5 minutes  Distance Ambulated (ft) 340 ft  Mobility Ambulated independently in hallway  Mobility Response Tolerated well  Mobility performed by Mobility specialist  Transport method Ambulatory  $Mobility charge 1 Mobility   Pt was received sitting EOB, eager for mobility and d/c. Ambulated in hall 340' with supervision. Pt tolerated well and left sitting EOB with all needs met.   Pre Mobility: HR 114 bpm During Mobility: HR 134 bpm Post Mobility: HR 121 bpm  Nelta Numbers Mobility Specialist  Phone [phone]

## 2020-11-12 NOTE — Progress Notes (Addendum)
Patient ID: Jonathan [name], male   DOB: Mar 08, 1985, 35 y.o.   MRN: [id number]     Advanced Heart Failure Rounding Note  PCP-Cardiologist: Armanda Magic, MD  HF MD: Dr. Gala Romney   Patient Profile   Jonathan [name] is a 35 year old male with history of HFrEF, NICM, HTN, asthma, OSA, morbid obesity, and uncontrolled DM. Hx of medical noncompliance. Admitted on 09/13 with acute on chronic systolic HF, severe hyperglycemia, AKI and abdominal pain.    Subjective:    09/13: Started on Empiric milrinone 0.25, lasix gtt at 10/hr 09/14: Developed AFL w/ RVR>>amio gtt started  9/21: Milrinone increased 0.375 mcg.  9/22: Lasix drip and diamox stopped. Switched to torsemide.  09/25: Co-ox dropped to 43% with attempting to wean milrinone. CVP up, Torsemide switched to IV lasix.  09/26: Milrinone back up to 0.375  CO-OX 57% on 0.375 mcg/kg/min milrinone  No dyspnea overnight. Denies CP. Ready to go home.   CVP 10. -5L last 24 hrs with 100 mg Torsemide BID. Weight down another 7 lb overnight  Scr continues to improve, 1.63 today  25 beat run WCT around 12:30 am. K 3.5.    Cardiac Studies   Echo 09/22: EF < 20%, RV fxn moderately reduced, trivial MR, mild TR Echo 10/21: EF 30-35% with moderate RV dysfunction in setting of recurrent RBBB with significant dyssynchrony.  ECHO 04/2019: EF 30-35%  ECHO 2019: EF 55%   Echo EF 15-20% in [place] in 2017. Cath around that time without any significant CAD per patient.  Objective:   Weight Range: (!) 158.4 kg Body mass index is 44.83 kg/m.   Vital Signs:   Temp:  [97.5 F (36.4 C)-98.7 F (37.1 C)] 98.4 F (36.9 C) (09/28 0337) Pulse Rate:  [106-115] 112 (09/28 0815) Resp:  [12-33] 18 (09/28 0815) BP: (120-144)/(48-96) 127/96 (09/28 0815) SpO2:  [99 %-100 %] 99 % (09/28 0337) Weight:  [158.4 kg] 158.4 kg (09/28 0337) Last BM Date: 11/09/20  Weight change: Filed Weights   11/10/20 0256 11/11/20 0342 11/12/20 0337  Weight: (!) 162.7 kg (!) 161.7 kg  (!) 158.4 kg    Intake/Output:   Intake/Output Summary (Last 24 hours) at 11/12/2020 0829 Last data filed at 11/12/2020 0815 Gross per 24 hour  Intake 1077 ml  Output 5700 ml  Net -4623 ml      Physical Exam   CVP 10 General:  [name] in no distress, sitting on side of bed. Wife present HEENT: normal Neck: supple. JVP 10 cm. Carotids 2+ bilat; no bruits. No lymphadenopathy or thryomegaly appreciated. Cor: PMI nondisplaced. Regular rhythm, tachy. No rubs, gallops or murmurs. Lungs: clear Abdomen: soft, nontender, nondistended. No hepatosplenomegaly. No bruits or masses. Good bowel sounds. Extremities: no cyanosis, clubbing, rash, trace edema Neuro: alert & orientedx3, cranial nerves grossly intact. moves all 4 extremities w/o difficulty. Affect pleasant      Telemetry   Sinus tachycardia 100s-110s, 25 beat run WCT overnight  Labs    CBC Recent Labs    11/11/20 0520 11/12/20 0425  WBC 6.6 8.8  HGB 14.3 15.2  HCT 42.3 44.3  MCV 89.8 89.1  PLT 241 252   Basic Metabolic Panel Recent Labs    53/66/44 0431 11/10/20 1700 11/10/20 2200 11/11/20 0520 11/11/20 1300 11/12/20 0425  NA 127*   < > 128*   < > 127* 132*  K 3.7   < > 3.9   < > 3.9 3.5  CL 91*   < > 89*   < >  90* 91*  CO2 25   < > 26   < > 27 30  GLUCOSE 194*   < > 178*   < > 139* 104*  BUN 47*   < > 51*   < > 45* 40*  CREATININE 1.95*   < > 1.99*   < > 1.63* 1.63*  CALCIUM 9.0   < > 9.2   < > 9.3 9.3  MG 2.4  --  2.2  --   --   --   PHOS 4.8*  --   --   --   --   --    < > = values in this interval not displayed.   Liver Function Tests Recent Labs    11/10/20 0431 11/10/20 2200  AST  --  23  ALT  --  25  ALKPHOS  --  110  BILITOT  --  1.8*  PROT  --  7.9  ALBUMIN 2.9* 3.2*    No results for input(s): LIPASE, AMYLASE in the last 72 hours. Cardiac Enzymes No results for input(s): CKTOTAL, CKMB, CKMBINDEX, TROPONINI in the last 72 hours.  BNP: BNP (last 3 results) Recent Labs     05/01/20 1020 09/11/20 0951 10/28/20 1315  BNP 862.1* 803.3* 1,547.7*    ProBNP (last 3 results) No results for input(s): PROBNP in the last 8760 hours.   D-Dimer No results for input(s): DDIMER in the last 72 hours. Hemoglobin A1C No results for input(s): HGBA1C in the last 72 hours. Fasting Lipid Panel No results for input(s): CHOL, HDL, LDLCALC, TRIG, CHOLHDL, LDLDIRECT in the last 72 hours. Thyroid Function Tests No results for input(s): TSH, T4TOTAL, T3FREE, THYROIDAB in the last 72 hours.  Invalid input(s): FREET3  Other results:   Imaging    No results found.   Medications:     Scheduled Medications:  amiodarone  200 mg Oral BID   apixaban  5 mg Oral BID   Chlorhexidine Gluconate Cloth  6 each Topical Daily   digoxin  0.125 mg Oral Daily   Dulaglutide  1.5 mg Subcutaneous Weekly   insulin aspart  0-15 Units Subcutaneous TID WC   insulin aspart  20 Units Subcutaneous TID WC   insulin glargine-yfgn  30 Units Subcutaneous BID   isosorbide-hydrALAZINE  0.5 tablet Oral TID   lidocaine  1 patch Transdermal Q24H   loratadine  10 mg Oral Daily   melatonin  3 mg Oral QHS   metolazone  2.5 mg Oral Q M,W,F   potassium chloride  60 mEq Oral BID   sodium chloride flush  10-40 mL Intracatheter Q12H   sodium chloride flush  3 mL Intravenous Q12H   spironolactone  25 mg Oral Daily   torsemide  100 mg Oral BID    Infusions:  milrinone 0.375 mcg/kg/min (11/12/20 0823)    PRN Medications: acetaminophen **OR** acetaminophen, alum & mag hydroxide-simeth, camphor-menthol, hydrOXYzine, influenza vac split quadrivalent PF, methocarbamol, sodium chloride flush  Assessment/Plan   A/C HFrEF with low output, NICM  -Most recent ECHO back in 2021 EF 30-35 % with moderate RV dysfunction in the setting of recurrent RBBB with significant dyssynchrony. Echo this admit with EF < 20% and moderate RV dysfunction - Admitted with marked volume overload w/ low output.  - Failed  milrinone wean. Co-ox very low on 0.125 with increase in CVP > 20 suggestive of severe RV failure. Increased back to 0.375 mcg/kg/min. Co-ox marginal, 52% this am. - CVP 10. Volume status stable. Very brisk  diuresis overnight. Down 60 lb from admit. -Will decrease Torsemide to 80 mg BID and metolazone M, F at discharge. Replace K.  - Intolerant SGLT2i due to candidiasis +uncontrolled DM - Continue spiro 25 mg daily - Continue digoxin 0.125. Dig level 0.5  - BP up. Increase Bidil to 1 tablet TID - Off ARNI due to AKI. Can consider adding back at f/u if renal function remains stable. - No beta blocker d/t low output - Not candidate for VAD or transplant currently due to size and Hgba1c > 14   - He has end-stage HF and has failed milrinone wean. Now inotrope dependent. They want to proceed with trial of home inotrope. Explained that we are not sure how long inotrope therapy will provide benefit. Palliative Care following. Have arranged for home milrinone with [facility] and [facility]. Appreciate assistance from [facility] CM/SW.   2. Uncontrolled DM -9/1 Hgb A1C > 14.  -Per primary team.    3. AKI - Due to ATN/cardiorenal. Creatinine baseline ~ 1.6.  - SCr 2.1->2.2 -> 2.0 -> 1.95 -> 1.70 -> 1.63 - Continues to improve with inotrope therapy and diuresis  4. Atrial Flutter  - Remains in sinus.  - IV amio switched to po 200 mg BID - Continue eliquis 5 mg twice a day.  - Hgb stable   5. Hypervolemic Hyponatremia  - Na 128 -> 125 -> 128 -> 127 -> 132 - Restrict free water.  - Continue to monitor   7. OSA: Severe OSA AHI 65 -  Bipap -needs to use nightly.    8. Morbid Obesity  Body mass index is 44.83 kg/m.  9. Hypokalemia/ Hypomagnesemia - Replace aggressively with diuresis  10. NSVT  - 25 beat run WCT overnight - K 3.5, will replace - checking mag - Keep K > 4 and Mag > 2  11. GOC Palliative Care following for GOC discussions. He is now DNR. Will be going home with  hospice and IV milrinone. See discussion above  12. H/O medical noncompliance -Reports taking HF medications prior to admit.  -Missed last few clinic f/u and discharged from HF paramedicine d/t noncompliance.    DISCHARGE PLANNING: -Okay for discharge from cardiac perspective -HF Medications: Milrinone 0.375 mcg/kg/min  Amiodarone 200 mg BID, can cut back to 200mg  daily at f/u next week Apixaban 5 mg BID Digoxin 0.125 mg daily Bidil 1 tablet TID Spiro 25 mg daily Torsemide 80 mg BID Metolazone 2.5 mg M, F Potassium 60 mEq BID, extra 40 mEq days taking metolazone -Discharge home with HH/Hospice for home inotropes.  -Home PT/OT -Has f/u in HF clinic scheduled  Length of Stay: [address]  FINCH, LINDSAY N, PA-C  11/12/2020, 8:29 AM  Advanced Heart Failure Team Pager [phone] (M-F; 7a - 5p)  Please contact [facility] for night-coverage after hours (5p -7a ) and weekends on [email or web address]  Patient seen and examined with the above-signed Advanced Practice Provider and/or Housestaff. I personally reviewed laboratory data, imaging studies and relevant notes. I independently examined the patient and formulated the important aspects of the plan. I have edited the note to reflect any of my changes or salient points. I have personally discussed the plan with the patient and/or family.  Weight down 7 pounds overnight. SCr stable. CVP 10. Co-ox 57% Eager to go home  General:  Sitting up on side of bed No resp difficulty HEENT: normal Neck: supple. JVP 10 Carotids 2+ bilat; no bruits. No lymphadenopathy or thryomegaly appreciated. Cor: PMI nondisplaced.  Regular tachy  No rubs, gallops or murmurs. Lungs: clear Abdomen: obese soft, nontender, nondistended. No hepatosplenomegaly. No bruits or masses. Good bowel sounds. Extremities: no cyanosis, clubbing, rash, edema Neuro: alert & orientedx3, cranial nerves grossly intact. moves all 4 extremities w/o difficulty. Affect pleasant  Remains inotrope  dependent. Will plan d/c home today on above meds Will see back in clinic next week. Long discussion about management of home inotropes and what trajectory may look like.   Appreciate IM care.   Total time spent 35 minutes. Over half that time spent discussing above.   Arvilla Meres, MD  10:50 AM

## 2020-11-12 NOTE — Progress Notes (Signed)
Went over discharge instructions with the patient and patient's wife on the phone. Patient and patient's wife verbalize understanding of all medication changes. Patient IV milrinone has been set up and tele has been removed. CCMD has been notified. Patient has been made aware of follow up as well.

## 2020-11-12 NOTE — Discharge Summary (Signed)
Name: Jonathan [name] MRN: [id number] DOB: January 16, 1986 35 y.o. PCP: [name], MD  Date of Admission: 10/28/2020  2:57 AM Date of Discharge:  11/12/2020 Attending Physician: Dr. Daryll Drown  DISCHARGE DIAGNOSIS:  Primary Problem: Acute on chronic heart failure [facility])   Hospital Problems: Principal Problem:   Acute on chronic heart failure (HCC) Active Problems:   Diabetes mellitus type 2 in obese [facility])   Essential hypertension   OSA (obstructive sleep apnea)   Goals of care, counseling/discussion   Diabetes mellitus ([place])    DISCHARGE MEDICATIONS:   Allergies as of 11/12/2020       Reactions   Hydrocodone    Hives         Medication List     STOP taking these medications    carvedilol 25 MG tablet Commonly known as: Coreg   Entresto 97-103 MG Generic drug: sacubitril-valsartan   fluconazole 100 MG tablet Commonly known as: Diflucan   guaiFENesin-codeine 100-10 MG/5ML syrup   Olopatadine HCl 0.2 % Soln   ondansetron 4 MG disintegrating tablet Commonly known as: Zofran ODT   ondansetron 4 MG tablet Commonly known as: Zofran       TAKE these medications    Accu-Chek Guide test strip Generic drug: glucose blood Check blood sugar 3 times per day What changed:  how much to take how to take this when to take this   Accu-Chek Guide w/Device Kit Check blood sugar 3 times a day What changed:  how much to take how to take this when to take this   Accu-Chek Softclix Lancets lancets Check blood sugar 3x a day What changed:  how much to take how to take this when to take this   albuterol 108 (90 Base) MCG/ACT inhaler Commonly known as: VENTOLIN HFA Inhale 2 puffs into the lungs every 4 (four) hours as needed for wheezing or shortness of breath.   amiodarone 200 MG tablet Commonly known as: PACERONE Take 1 tablet (200 mg total) by mouth 2 (two) times daily.   apixaban 5 MG Tabs tablet Commonly known as: ELIQUIS Take 1 tablet (5 mg total) by  mouth 2 (two) times daily.   azelastine 0.1 % nasal spray Commonly known as: ASTELIN Place 2 sprays into both nostrils 2 (two) times daily. Use in each nostril as directed What changed: additional instructions   blood glucose meter kit and supplies Kit Dispense based on patient and insurance preference. Use up to four times daily as directed. (FOR ICD-9 250.00, 250.01). For QAC - HS accuchecks. May switch to any brand. What changed:  how much to take how to take this when to take this   digoxin 0.125 MG tablet Commonly known as: LANOXIN Take 1 tablet (0.125 mg total) by mouth daily.   fluticasone 50 MCG/ACT nasal spray Commonly known as: FLONASE Apply 2 sprays each nostril daily for 1-2 weeks What changed:  how much to take how to take this when to take this additional instructions   gabapentin 300 MG capsule Commonly known as: Neurontin Take 1 capsule (300 mg total) by mouth 3 (three) times daily. What changed:  when to take this reasons to take this   influenza vac split quadrivalent PF 0.5 ML injection Commonly known as: FLUARIX Inject 0.5 mLs into the muscle once for 1 dose.   insulin aspart 100 UNIT/ML injection Commonly known as: novoLOG Inject 20 Units into the skin 3 (three) times daily with meals.   insulin glargine 100 UNIT/ML injection Commonly known as:  LANTUS Inject 0.3 mLs (30 Units total) into the skin 2 (two) times daily.   Insulin Syringe-Needle U-100 30G X 1/2" 1 ML Misc Use to inject insulin 4 times a day.   isosorbide-hydrALAZINE 20-37.5 MG tablet Commonly known as: BIDIL Take 1 tablet by mouth 3 (three) times daily.   levocetirizine 5 MG tablet Commonly known as: XYZAL Take 1 tablet (5 mg total) by mouth every evening.   metolazone 2.5 MG tablet Commonly known as: ZAROXOLYN Take 1 tablet (2.5 mg total) by mouth daily. What changed: See the new instructions.   milrinone 20 MG/100 ML Soln infusion Commonly known as: PRIMACOR Inject  0.0622 mg/min into the vein continuous.   nystatin powder Commonly known as: nystatin Apply 1 application topically 3 (three) times daily. What changed:  when to take this reasons to take this   potassium chloride SA 20 MEQ tablet Commonly known as: KLOR-CON Take 3 tablets (60 mEq total) by mouth 2 (two) times daily. What changed:  how much to take how to take this when to take this additional instructions   spironolactone 25 MG tablet Commonly known as: ALDACTONE TAKE 1 TABLET BY MOUTH EVERY DAY   torsemide 20 MG tablet Commonly known as: DEMADEX Take 4 tablets (80 mg total) by mouth 2 (two) times daily. What changed:  how much to take when to take this   Trulicity 1.5 WC/5.8NI Sopn Generic drug: Dulaglutide Inject 1.5 mg into the skin once a week.               Durable Medical Equipment  (From admission, onward)           Start     Ordered   11/12/20 1308  DME 3-in-1  Once        11/12/20 1308   11/09/20 2004  Heart failure home health orders  (Heart failure home health orders / Face to face)  Once       Comments: Heart Failure Follow-up Care:  Verify follow-up appointments per Patient Discharge Instructions. Confirm transportation arranged. Reconcile home medications with discharge medication list. Remove discontinued medications from use. Assist patient/caregiver to manage medications using pill box. Reinforce low sodium food selection Assessments: Vital signs and oxygen saturation at each visit. Assess home environment for safety concerns, caregiver support and availability of low-sodium foods. Consult Education officer, museum, PT/OT, Dietitian, and CNA based on assessments. Perform comprehensive cardiopulmonary assessment. Notify MD for any change in condition or weight gain of 3 pounds in one day or 5 pounds in one week with symptoms. Daily Weights and Symptom Monitoring: Ensure patient has access to scales. Teach patient/caregiver to weigh daily before  breakfast and after voiding using same scale and record.    Teach patient/caregiver to track weight and symptoms and when to notify Provider. Activity: Develop individualized activity plan with patient/caregiver.   Home Paraenteral Inotropic Therapy : Data Collection Form  Patients name: Gracyn Santillanes   Date: 11/09/20  Information below may not be completed by the supplier nor anyone in a Financial relationship with the supplier.  1. Results of invasive hemodynamic monitoring  Cardiac Index Before Inotrope infusion:           1.5            On Inotrope infusion:            1.9              Drug and dose:   Milrinone 025 mcg /kg/min   2. Cardiac  medications immediately prior to inotrope infusion (List name, dose, and frequency)  Lasix drip  3. Dose this represent maximum tolerated doses of these medications? Yes.   4. Breathing status Prior to inotrope infusion: Dyspnea at rest  At time of discharge: Dyspnea on moderate  exertion.   5. Initial home prescription Drug and Dose:   Milrinone 0.25 mcg  for continuous infusion 24/hr day and 7 days/week  6. If continuous infusion is prescribed, have attempts to discontinue inotrope infusion in the hospital failed?   Yes.  7. If intermittent infusion is prescribed, have there been repeated hospitalizations for heart failure which Parenteral inotrope were required? Not applicable.   8. Is patient capable of going to the physician for outpatient evaluation? Yes.    9. Is routine electrocardiographic monitoring required in the Home?  No.   The above statements and any additional explanations included separately are true and accurate and there is documentation present in the patients medical record to support these statements.   Completed by Darrick Grinder, NP   In instances where this form was completed by an Advanced Practice Provider, please see EMR for physician Co-Signature.  [facility] to provide  Labs every other week to include BMET,  Mg, and CBC with Diff. Additional as needed. Should be drawn via PERIPHERAL stick. NOT PICC line.   W2993 Milrinone 0.25 mcg/kg/min X 52 weeks  A4221 Supplies for maintenance of drug infusion catheter A4222 Supplies for the external drug infusion per cassette or bag E0781 Ambulatory Infusion pump  Question Answer Comment  Heart Failure Follow-up Care [facility] ([facility]) [facility] at [phone]   Obtain the following labs Basic Metabolic Panel   Lab frequency Weekly   Fax lab results to AHF Clinic at [phone]   Diet Low Sodium Heart Healthy   Fluid restrictions: 2000 mL Fluid      11/09/20 2005   10/30/20 0955  For home use only DME Bedside commode  Once       Comments: HD  Question:  Patient needs a bedside commode to treat with the following condition  Answer:  Weakness   10/30/20 0954            DISPOSITION AND FOLLOW-UP:  Mr.[name] Markowicz was discharged from [facility] in [place] condition. At the hospital follow up visit please address:  #Acute on chronic biventricular, low output failure Follow-up BMP.  Patient will follow up with the heart failure clinic outpatient.  #Aflutter #HTN Assess for medication adherence as below.  #Uncontrolled type 2 diabetes mellitus Follow-up CBG.  Patient is interested in obtaining Dexcom.  #Cardiorenal syndrome Follow-up BMP  #Hypervolemic hyponatremia Follow-up BMP  Follow-up Recommendations: Consults: Cardiology Labs: Basic Metabolic Profile Studies: None Medications:  Milrinone 0.375 mcg/kg/min  Amiodarone 200 mg BID, can cut back to 264m daily at f/u next week Apixaban 5 mg BID Digoxin 0.125 mg daily Bidil 1 tablet TID Spiro 25 mg daily Torsemide 80 mg BID Metolazone 2.5 mg M, F Potassium 60 mEq BID, extra 40 mEq days taking metolazone Continue Trulicity 1.5 mg once weekly Semglee 30 units twice daily NovoLog 20 units 3 times daily  Follow-up Appointments:  Follow-up Information      [facility] Follow up on 11/20/2020.   Specialty: Cardiology Why: Heart Failure Clinic at [address] Contact information: [id number] [phone]       [facility]  Follow up.   Why: Home Hospice-agency will arrange initial visit Contact information: [address] [place] [address] [phone]         Sid Falcon, MD Follow up.   Specialty: Internal Medicine Why: GO: OCT. 12 @ 2:15PM Contact information: [place] [address] [phone]                 HOSPITAL COURSE:  Patient Summary: #Acute on chronic biventricular, low output failure On presentation to ED patient had marked volume overload.  BNP was elevated at 1547 and CT abdomen showed ascites.  He was given Lasix 80 mg IV and initiated on Lasix drip at 10 mg/h.  PICC line was placed for monitoring of CVP and co-ox.  Echocardiogram was completed on 9/13 and showed global hypokinesis with apical akinesis, LV EF less than 20% with global hypokinesis, consistent with grade 2 diastolic dysfunction; RV moderately reduced systolic function with normal size; mild dilation of RA.  He was initiated on 0.25 mcg of milrinone, digoxin 0.125 mg daily.  Lasix drip was increased to 20 mg/h on 9/14.  He also received 2.5 mg of metolazone 9/15 as well as 9/16.  On 9/16 spironolactone 12.5 mg daily was added to his regimen.  On 9/17, Lasix was increased to 30 mg/h, spironolactone was increased to 25 mg daily , and acetazolamide 250 mg twice daily was added.  On 9/20 he was given an additional dose of metolazone 2.5 mg.  On 9/21, lactic acid was elevated at 2.0 and Co. ox was 49%.  Milrinone was increased at that time to 0.375 mcg.  On 9/22, the heart failure team felt that he was reaching maximal diuresis and discontinued Lasix and acetazolamide.  He was transitioned  to torsemide 100 mg twice daily.  Milrinone was decreased to 0.25 mcg.  On 9/23, milrinone was decreased to 0.2 mcg.  Cox testing continued to trend downward with weaning of milrinone.  On 9/24, milrinone was further decreased to 0.25 mcg.  Discussion was had with the patient and his wife regarding his now evident inotrope dependence.  At that time they wish to proceed with a trial of home inotropes.  Due to poor response to weaning, patient was switched from torsemide 100 mg p.o. twice daily to Lasix 120 mg IV twice daily on 9/26.  This was again reverted to torsemide 100 mg p.o. twice daily 9/27.  At this time he is also initiated on metolazone 2.5 mg Monday, Wednesday, Fridays. He is overall net -39.9 and down 28.2 kg this admission.    #Aflutter At admission patient was in sinus tachycardia.  9/14 morning rate of 150 bpm with regular rhythm.  Stat EKG was ordered and showed sinus tachycardia with short PR and premature supraventricular complexes as well as right bundle branch block.  He was given a bolus of amiodarone 150 mg and started on amiodarone drip at 60 mg/h, transition to 30 mg/h, and heparin drip.  On 9/22 he was transitioned from heparin to Eliquis 5 mg twice daily.  He was transitioned to amiodarone 200 mg p.o. twice daily on 9/26.  On day of discharge, patient remained in sinus rhythm.    #Uncontrolled type 2 diabetes mellitus On presentation, blood sugars were elevated between 203 150.  He was initiated on Lantus 10 units daily.  Beta hydroxybutyrate was elevated at 0.17.  On 9/14 Lantus was increased to 20 units.  On 9/15, 5U of scheduled insulin was added to  his regimen in addition to sliding scale insulin at mealtimes.  On 9/16, Lantus was increased to 25 units/day with increase of his scheduled mealtime insulin of 7 units plus sliding scale insulin.  This was increased again on 9/17 to 30 units daily with increase of scheduled mealtime insulin to 10 units with mealtime sliding scale  insulin.  On 9/19 this was increased to 35 units.  Due to persistent hyperglycemia despite long-acting, scheduled mealtime, sliding scale insulin at mealtime, patient was transitioned to [facility] 25 units twice daily in addition to 12 units scheduled at mealtimes with sliding scale insulin.  On 9/21, scheduled mealtime insulin was increased to 15 units with sliding scale insulin.  In discussions regarding getting better glucose control, the patient stated that he is on Trulicity 1.5 mg at home however he did not bring this in during the admission.  On 9/25, Semglee was increased to 30 units twice daily.  His mealtime scheduled insulin was also increased to 20 units with sliding scale.    #Cardiorenal syndrome Creatinine on presentation was 1.63, from a baseline of 0.93.  He described decreased urine output over the week prior to emergency department presentation.  His renal function was stable over the course of this admission, however as changes were made to inotrope therapy, renal function slightly declined again.  He had a creatinine of 1.63 on day of discharge.    #HTN Chronic condition, historically treated with carvedilol 25 mg twice daily, Entresto 97-103 mg twice daily, spironolactone 25 mg daily.  These were held until 9/16 at which time spironolactone 12.5 mg was added to regimen.  This was increased to 25 mg/day on 9/17.   #HLD Chronically managed.    #OSA Chronic condition.  Patient had BiPAP each night.    #Hypervolemic hyponatremia Patient hyponatremic on presentation with sodium of 125.  He was given a dose of tolvaptan at that time.  Hyponatremia persisted throughout this admission.  Sodium level on day of discharge of 132.   DISCHARGE INSTRUCTIONS:   Discharge Instructions     Increase activity slowly   Complete by: As directed    Increase activity slowly   Complete by: As directed        SUBJECTIVE:  Patient evaluated at bedside on day of discharge.  He reported  that he felt fine, was excited to be able to go home.  Discharge Vitals:   BP 132/85 (BP Location: Left Arm)   Pulse (!) 112   Temp 98.2 F (36.8 C) (Oral)   Resp 20   Ht 6' 2"  (1.88 m)   Wt (!) 158.4 kg Comment: scale c  SpO2 100%   BMI 44.83 kg/m   OBJECTIVE:  Constitutional: Patient is sitting on side of bed.  No acute distress noted. Cardio: Tachycardic with regular rhythm.  No murmurs, rubs, gallops. Pulm: Clear to auscultation bilaterally.  Normal work of breathing on room air. Abdomen: Soft, nontender, nondistended. MSK: 2+ pitting edema to bilateral lower extremities to level of knees with nonpitting edema proximally to the level of umbilicus. Skin: Skin is cool and dry. Neuro: Alert and oriented x3.  No focal deficit noted. Psych: Normal mood and affect.  Pertinent Labs, Studies, and Procedures:  CBC Latest Ref Rng & Units 11/12/2020 11/11/2020 11/10/2020  WBC 4.0 - 10.5 K/uL 8.8 6.6 7.3  Hemoglobin 13.0 - 17.0 g/dL 15.2 14.3 14.9  Hematocrit 39.0 - 52.0 % 44.3 42.3 44.7  Platelets 150 - 400 K/uL 252 241 246  CMP Latest Ref Rng & Units 11/12/2020 11/11/2020 11/11/2020  Glucose 70 - 99 mg/dL 104(H) 139(H) 243(H)  BUN 6 - 20 mg/dL 40(H) 45(H) 45(H)  Creatinine 0.61 - 1.24 mg/dL 1.63(H) 1.63(H) 1.70(H)  Sodium 135 - 145 mmol/L 132(L) 127(L) 127(L)  Potassium 3.5 - 5.1 mmol/L 3.5 3.9 3.4(L)  Chloride 98 - 111 mmol/L 91(L) 90(L) 89(L)  CO2 22 - 32 mmol/L 30 27 27   Calcium 8.9 - 10.3 mg/dL 9.3 9.3 9.1  Total Protein 6.5 - 8.1 g/dL - - -  Total Bilirubin 0.3 - 1.2 mg/dL - - -  Alkaline Phos 38 - 126 U/L - - -  AST 15 - 41 U/L - - -  ALT 0 - 44 U/L - - -    CT ABDOMEN PELVIS W CONTRAST  Result Date: 10/28/2020 CLINICAL DATA:  Left lower quadrant abdominal pain, groin pain, nausea for 3 days EXAM: CT ABDOMEN AND PELVIS WITH CONTRAST TECHNIQUE: Multidetector CT imaging of the abdomen and pelvis was performed using the standard protocol following bolus administration of  intravenous contrast. CONTRAST:  147m OMNIPAQUE IOHEXOL 350 MG/ML SOLN COMPARISON:  None. FINDINGS: Lower chest: There is mosaic attenuation in the lung bases. There is no focal consolidation or pleural effusion. The heart is enlarged. There is reflux of contrast into the IVC and hepatic veins. Hepatobiliary: The liver is diffusely hypoattenuating likely reflecting fatty infiltration. Layering hyperdense material in the gallbladder likely reflects sludge. There is mild pericholecystic stranding without appreciable wall thickening. There is no biliary ductal dilatation. Pancreas: The pancreas is atrophic. There are no focal lesions or contour abnormalities. There is no main pancreatic ductal dilatation or peripancreatic inflammatory change. Spleen: Unremarkable. Adrenals/Urinary Tract: The adrenals are unremarkable. The kidneys are symmetrically hypoenhancing, suspected due to decreased cardiac output. There are no focal lesions or stones. There is no hydronephrosis or hydroureter. The bladder is decompressed but grossly unremarkable. Stomach/Bowel: The stomach is unremarkable. There is no evidence of bowel obstruction. There is no abnormal bowel wall thickening or inflammatory change. The appendix is normal. There is a mild stool burden in the right colon extending to proximal transverse colon. Vascular/Lymphatic: The abdominal aorta is nonaneurysmal. The venous structures are not evaluated due to contrast bolus timing. There is no abdominal or pelvic lymphadenopathy. Reproductive: The prostate and seminal vesicles are unremarkable. Other: There is small to moderate volume ascites. There is no organized drainable fluid collection in the abdomen or pelvis. There is no free intraperitoneal air. Musculoskeletal: There is intervertebral disc space narrowing with vacuum disc phenomenon at L4-L5 and L5-S1. There is no acute osseous abnormality or aggressive osseous lesion. IMPRESSION: 1. Small to moderate volume ascites.  No organized or drainable fluid collection in the abdomen or pelvis. 2. Layering sludge in the gallbladder with mild pericholecystic fat stranding but no appreciable wall thickening. Correlate with history and exam, and consider right upper quadrant ultrasound or HIDA scan if there is clinical concern for acute cholecystitis. 3. Hepatic steatosis. 4. Cardiomegaly with evidence of right heart dysfunction as above. 5. Mosaic attenuation in the lung bases suggests air trapping/small airway disease. Electronically Signed   By: PValetta MoleM.D.   On: 10/28/2020 08:57   DG Chest Portable 1 View  Result Date: 10/28/2020 CLINICAL DATA:  Shortness of breath, tachycardia EXAM: PORTABLE CHEST 1 VIEW COMPARISON:  09/11/2020 FINDINGS: Cardiomegaly. Lingular scarring or atelectasis. Right lung clear. No effusions or acute bony abnormality. IMPRESSION: Cardiomegaly. Lingular scarring or atelectasis. No active disease. Electronically Signed  By: Rolm Baptise M.D.   On: 10/28/2020 11:34   ECHOCARDIOGRAM COMPLETE  Result Date: 10/28/2020    ECHOCARDIOGRAM REPORT   Patient Name:   KENRICK PORE Date of Exam: 10/28/2020 Medical Rec #:  [id number]     Height:       74.0 in Accession #:    [id number]    Weight:       395.1 lb Date of Birth:  1985/10/18      BSA:          2.903 m Patient Age:    41 years      BP:           162/137 mmHg Patient Gender: M             HR:           118 bpm. Exam Location:  Inpatient Procedure: 2D Echo, Cardiac Doppler and Color Doppler Indications:    CHF  History:        Patient has prior history of Echocardiogram examinations, most                 recent 12/03/2019. CHF; Risk Factors:Diabetes and Hypertension.                 Patient refused the use of Definity.  Sonographer:    Wenda Low Referring Phys: [phone] AMY D CLEGG IMPRESSIONS  1. GLobal hypokineiss with apical akinesis. Compared to the echo 75/1025, systolic function is worse. Left ventricular ejection fraction, by estimation, is  <20%. The left ventricle has severely decreased function. The left ventricle demonstrates global hypokinesis. Left ventricular diastolic parameters are consistent with Grade II diastolic dysfunction (pseudonormalization).  2. Right ventricular systolic function is moderately reduced. The right ventricular size is normal.  3. Right atrial size was mildly dilated.  4. The mitral valve is normal in structure. Trivial mitral valve regurgitation. No evidence of mitral stenosis.  5. The aortic valve is normal in structure. There is mild calcification of the aortic valve. Aortic valve regurgitation is not visualized. No aortic stenosis is present. FINDINGS  Left Ventricle: GLobal hypokineiss with apical akinesis. Compared to the echo 85/2778, systolic function is worse. Left ventricular ejection fraction, by estimation, is <20%. The left ventricle has severely decreased function. The left ventricle demonstrates global hypokinesis. The left ventricular internal cavity size was normal in size. There is no left ventricular hypertrophy. Left ventricular diastolic parameters are consistent with Grade II diastolic dysfunction (pseudonormalization). Indeterminate filling pressures. Right Ventricle: The right ventricular size is normal. No increase in right ventricular wall thickness. Right ventricular systolic function is moderately reduced. Left Atrium: Left atrial size was normal in size. Right Atrium: Right atrial size was mildly dilated. Pericardium: There is no evidence of pericardial effusion. Mitral Valve: The mitral valve is normal in structure. Trivial mitral valve regurgitation. No evidence of mitral valve stenosis. MV peak gradient, 5.9 mmHg. The mean mitral valve gradient is 2.0 mmHg. Tricuspid Valve: The tricuspid valve is normal in structure. Tricuspid valve regurgitation is mild . No evidence of tricuspid stenosis. Aortic Valve: The aortic valve is normal in structure. There is mild calcification of the aortic  valve. Aortic valve regurgitation is not visualized. No aortic stenosis is present. Aortic valve mean gradient measures 1.0 mmHg. Aortic valve peak gradient measures 1.8 mmHg. Aortic valve area, by VTI measures 2.18 cm. Pulmonic Valve: The pulmonic valve was normal in structure. Pulmonic valve regurgitation is trivial. No evidence of pulmonic stenosis. Aorta: The aortic  root is normal in size and structure. IAS/Shunts: No atrial level shunt detected by color flow Doppler.  LEFT VENTRICLE PLAX 2D LVIDd:         5.50 cm      Diastology LVIDs:         5.00 cm      LV e' lateral:   7.20 cm/s LV PW:         1.30 cm      LV E/e' lateral: 11.7 LV IVS:        1.40 cm LVOT diam:     2.00 cm LV SV:         23 LV SV Index:   8 LVOT Area:     3.14 cm  LV Volumes (MOD) LV vol d, MOD A2C: 168.0 ml LV vol d, MOD A4C: 171.0 ml LV vol s, MOD A2C: 137.0 ml LV vol s, MOD A4C: 123.0 ml LV SV MOD A2C:     31.0 ml LV SV MOD A4C:     171.0 ml LV SV MOD BP:      42.2 ml LEFT ATRIUM             Index       RIGHT ATRIUM           Index LA diam:        4.80 cm 1.65 cm/m  RA Area:     26.30 cm LA Vol (A2C):   78.8 ml 27.15 ml/m RA Volume:   84.70 ml  29.18 ml/m LA Vol (A4C):   70.8 ml 24.39 ml/m LA Biplane Vol: 74.6 ml 25.70 ml/m  AORTIC VALVE AV Area (Vmax):    2.08 cm AV Area (Vmean):   1.85 cm AV Area (VTI):     2.18 cm AV Vmax:           68.00 cm/s AV Vmean:          50.200 cm/s AV VTI:            0.104 m AV Peak Grad:      1.8 mmHg AV Mean Grad:      1.0 mmHg LVOT Vmax:         45.00 cm/s LVOT Vmean:        29.500 cm/s LVOT VTI:          0.072 m LVOT/AV VTI ratio: 0.70  AORTA Ao Root diam: 3.10 cm MITRAL VALVE               TRICUSPID VALVE MV Area (PHT): 6.32 cm    TR Peak grad:   27.7 mmHg MV Area VTI:   1.46 cm    TR Vmax:        263.00 cm/s MV Peak grad:  5.9 mmHg MV Mean grad:  2.0 mmHg    SHUNTS MV Vmax:       1.21 m/s    Systemic VTI:  0.07 m MV Vmean:      57.4 cm/s   Systemic Diam: 2.00 cm MV Decel Time: 120 msec MV E  velocity: 84.00 cm/s Skeet Latch MD Electronically signed by Skeet Latch MD Signature Date/Time: 10/28/2020/4:53:21 PM    Final    [place] EKG SITE RITE  Result Date: 10/28/2020 If Site Rite image not attached, placement could not be confirmed due to current cardiac rhythm.    Signed: Farrel Gordon, D.O.  Internal Medicine Resident, PGY-1 [facility]  Pager: [phone] 2:33 PM, 11/12/2020

## 2020-11-13 ENCOUNTER — Encounter (HOSPITAL_COMMUNITY): Payer: Medicaid Other | Admitting: Internal Medicine

## 2020-11-13 DIAGNOSIS — I509 Heart failure, unspecified: Secondary | ICD-10-CM | POA: Diagnosis not present

## 2020-11-13 DIAGNOSIS — Z515 Encounter for palliative care: Secondary | ICD-10-CM | POA: Diagnosis not present

## 2020-11-13 DIAGNOSIS — I10 Essential (primary) hypertension: Secondary | ICD-10-CM | POA: Diagnosis not present

## 2020-11-13 DIAGNOSIS — F32A Depression, unspecified: Secondary | ICD-10-CM | POA: Diagnosis not present

## 2020-11-13 DIAGNOSIS — R109 Unspecified abdominal pain: Secondary | ICD-10-CM | POA: Diagnosis not present

## 2020-11-13 DIAGNOSIS — G4733 Obstructive sleep apnea (adult) (pediatric): Secondary | ICD-10-CM | POA: Diagnosis not present

## 2020-11-13 DIAGNOSIS — E119 Type 2 diabetes mellitus without complications: Secondary | ICD-10-CM | POA: Diagnosis not present

## 2020-11-14 DIAGNOSIS — I509 Heart failure, unspecified: Secondary | ICD-10-CM | POA: Diagnosis not present

## 2020-11-14 DIAGNOSIS — Z515 Encounter for palliative care: Secondary | ICD-10-CM | POA: Diagnosis not present

## 2020-11-14 DIAGNOSIS — E119 Type 2 diabetes mellitus without complications: Secondary | ICD-10-CM | POA: Diagnosis not present

## 2020-11-14 DIAGNOSIS — I10 Essential (primary) hypertension: Secondary | ICD-10-CM | POA: Diagnosis not present

## 2020-11-14 DIAGNOSIS — R109 Unspecified abdominal pain: Secondary | ICD-10-CM | POA: Diagnosis not present

## 2020-11-14 DIAGNOSIS — F32A Depression, unspecified: Secondary | ICD-10-CM | POA: Diagnosis not present

## 2020-11-14 DIAGNOSIS — G4733 Obstructive sleep apnea (adult) (pediatric): Secondary | ICD-10-CM | POA: Diagnosis not present

## 2020-11-15 DIAGNOSIS — G4733 Obstructive sleep apnea (adult) (pediatric): Secondary | ICD-10-CM | POA: Diagnosis not present

## 2020-11-15 DIAGNOSIS — F32A Depression, unspecified: Secondary | ICD-10-CM | POA: Diagnosis not present

## 2020-11-15 DIAGNOSIS — I509 Heart failure, unspecified: Secondary | ICD-10-CM | POA: Diagnosis not present

## 2020-11-15 DIAGNOSIS — Z515 Encounter for palliative care: Secondary | ICD-10-CM | POA: Diagnosis not present

## 2020-11-15 DIAGNOSIS — E119 Type 2 diabetes mellitus without complications: Secondary | ICD-10-CM | POA: Diagnosis not present

## 2020-11-15 DIAGNOSIS — I10 Essential (primary) hypertension: Secondary | ICD-10-CM | POA: Diagnosis not present

## 2020-11-15 DIAGNOSIS — R109 Unspecified abdominal pain: Secondary | ICD-10-CM | POA: Diagnosis not present

## 2020-11-16 DIAGNOSIS — R109 Unspecified abdominal pain: Secondary | ICD-10-CM | POA: Diagnosis not present

## 2020-11-16 DIAGNOSIS — G4733 Obstructive sleep apnea (adult) (pediatric): Secondary | ICD-10-CM | POA: Diagnosis not present

## 2020-11-16 DIAGNOSIS — I10 Essential (primary) hypertension: Secondary | ICD-10-CM | POA: Diagnosis not present

## 2020-11-16 DIAGNOSIS — E119 Type 2 diabetes mellitus without complications: Secondary | ICD-10-CM | POA: Diagnosis not present

## 2020-11-16 DIAGNOSIS — Z515 Encounter for palliative care: Secondary | ICD-10-CM | POA: Diagnosis not present

## 2020-11-16 DIAGNOSIS — F32A Depression, unspecified: Secondary | ICD-10-CM | POA: Diagnosis not present

## 2020-11-16 DIAGNOSIS — I509 Heart failure, unspecified: Secondary | ICD-10-CM | POA: Diagnosis not present

## 2020-11-17 DIAGNOSIS — R109 Unspecified abdominal pain: Secondary | ICD-10-CM | POA: Diagnosis not present

## 2020-11-17 DIAGNOSIS — E119 Type 2 diabetes mellitus without complications: Secondary | ICD-10-CM | POA: Diagnosis not present

## 2020-11-17 DIAGNOSIS — G4733 Obstructive sleep apnea (adult) (pediatric): Secondary | ICD-10-CM | POA: Diagnosis not present

## 2020-11-17 DIAGNOSIS — F32A Depression, unspecified: Secondary | ICD-10-CM | POA: Diagnosis not present

## 2020-11-17 DIAGNOSIS — I509 Heart failure, unspecified: Secondary | ICD-10-CM | POA: Diagnosis not present

## 2020-11-17 DIAGNOSIS — Z515 Encounter for palliative care: Secondary | ICD-10-CM | POA: Diagnosis not present

## 2020-11-17 DIAGNOSIS — I10 Essential (primary) hypertension: Secondary | ICD-10-CM | POA: Diagnosis not present

## 2020-11-18 ENCOUNTER — Other Ambulatory Visit: Payer: Self-pay | Admitting: *Deleted

## 2020-11-18 DIAGNOSIS — F32A Depression, unspecified: Secondary | ICD-10-CM | POA: Diagnosis not present

## 2020-11-18 DIAGNOSIS — R109 Unspecified abdominal pain: Secondary | ICD-10-CM | POA: Diagnosis not present

## 2020-11-18 DIAGNOSIS — I509 Heart failure, unspecified: Secondary | ICD-10-CM | POA: Diagnosis not present

## 2020-11-18 DIAGNOSIS — E119 Type 2 diabetes mellitus without complications: Secondary | ICD-10-CM | POA: Diagnosis not present

## 2020-11-18 DIAGNOSIS — Z515 Encounter for palliative care: Secondary | ICD-10-CM | POA: Diagnosis not present

## 2020-11-18 DIAGNOSIS — I10 Essential (primary) hypertension: Secondary | ICD-10-CM | POA: Diagnosis not present

## 2020-11-18 DIAGNOSIS — G4733 Obstructive sleep apnea (adult) (pediatric): Secondary | ICD-10-CM | POA: Diagnosis not present

## 2020-11-18 NOTE — Patient Outreach (Signed)
Care Coordination  11/18/2020  Jonathan [name] March 23, 1985 [id number]  RNCM had a successful outreach with [name] today. He was admitted 9/13-9/28, discharged home with Hospice. RNCM verified with patient that all health management needs are being met. RNCM will perform case closure.  Lurena Joiner RN, BSN   [employer] manager

## 2020-11-19 DIAGNOSIS — I509 Heart failure, unspecified: Secondary | ICD-10-CM | POA: Diagnosis not present

## 2020-11-19 DIAGNOSIS — G4733 Obstructive sleep apnea (adult) (pediatric): Secondary | ICD-10-CM | POA: Diagnosis not present

## 2020-11-19 DIAGNOSIS — I10 Essential (primary) hypertension: Secondary | ICD-10-CM | POA: Diagnosis not present

## 2020-11-19 DIAGNOSIS — R109 Unspecified abdominal pain: Secondary | ICD-10-CM | POA: Diagnosis not present

## 2020-11-19 DIAGNOSIS — Z515 Encounter for palliative care: Secondary | ICD-10-CM | POA: Diagnosis not present

## 2020-11-19 DIAGNOSIS — E119 Type 2 diabetes mellitus without complications: Secondary | ICD-10-CM | POA: Diagnosis not present

## 2020-11-19 DIAGNOSIS — F32A Depression, unspecified: Secondary | ICD-10-CM | POA: Diagnosis not present

## 2020-11-19 NOTE — Progress Notes (Signed)
PCP: Dr Daryll Drown  HF Cardiologist: Dr Haroldine Laws   HPI: Jonathan [name] is a 35 year old with a history of chronic HFrEF, NICM, HTN, asthma, OSA, morbid obesity, and uncontrolled DM.    Admitted 10/2016 with progressive SOB x 2 weeks. He reports h/o systolic CHF with EF 56-25% by Echo in [place] in 2017. Cath around that time without any significant CAD per patient. He was diuresed with IV Lasix, discharge weight was 373 pounds. He had run out of most of his medications due to moving to [place] and not having insurance. Meds restarted at discharge: Coreg, Mearl Latin, Lasix.    He had repeat Echo 03/02/2017. EF had normalized to 55% but went back down when he was off HF meds. EF in 2021 back down to 30-35%. He has refused ICD.    Over the last year he has had ongoing difficulty with medication/dietary compliance resulting in volume overload. Managing diuretics in the community has been a challenge with frequent adjustments. At one time he was followed by HF paramedicine but was discharged due poor compliance.    He has been followed in the HF clinic and was last seen 05/2020.  He cancelled follow up appointments 08/2020 and 09/2020.    10/16/20 Hgb A1C > 14. Saw PCP last week and was volume overloaded and discussion about starting insulin. He was reluctant to start due to needle phobia.  Dr Gilford Rile noted he had not been able to tolerate his diuretics due to running out of his potassium supplement.  Weight at that time 395 pounds.   Admitted 10/28/20 with A/C HFrEF, A fib RVR , and marked volume overload. Hospital course complicated by low output so milrinone started. Diuresed with lasix drip + metolazone+ diamox.   Failed milrinone wean and was discharged on milrinone. He was not a candidate for advanced therapies due to noncompliance and uncontrolled diabetes. HGB A1C >14, Palliative consulted for goals of care. He elected DNR/DNI. Referred to [facility] for Hospice services.   Today he returns for post hospital HF  follow up with his wife. Overall feeling fine. SOB with inclines. Denies PND/Orthopnea. Walking 10 minutes once a day and taking his dog out. Following low salt diet. Appetite ok. No fever or chills. Weight at home 343 pounds. Glucose remains elevated. Taking all medications. No issues with PICC. Followed by [facility] for Hospice services. His wife flushes the unused PICC port.    Cardiac Studies   Echo 10/21: EF 30-35% with moderate RV dysfunction in setting of recurrent RBBB with significant dyssynchrony.  ECHO 04/2019: EF 30-35%  ECHO 2019: EF 55%   Echo 10/2020 EF  15-20% in [place] in 2017. Cath around that time without   ROS: All systems negative except as listed in HPI, PMH and Problem List.  SH:  Social History   Socioeconomic History   Marital status: Married    Spouse name: Not on file   Number of children: Not on file   Years of education: Not on file   Highest education level: Not on file  Occupational History   Not on file  Tobacco Use   Smoking status: Never   Smokeless tobacco: Never  Vaping Use   Vaping Use: Never used  Substance and Sexual Activity   Alcohol use: No   Drug use: No   Sexual activity: Never  Other Topics Concern   Not on file  Social History Narrative   Not on file   Social Determinants of Health  Financial Resource Strain: Low Risk    Difficulty of Paying Living Expenses: Not very hard  Food Insecurity: No Food Insecurity   Worried About Charity fundraiser in the Last Year: Never true   Ran Out of Food in the Last Year: Never true  Transportation Needs: No Transportation Needs   Lack of Transportation (Medical): No   Lack of Transportation (Non-Medical): No  Physical Activity: Not on file  Stress: Not on file  Social Connections: Not on file  Intimate Partner Violence: Not on file    FH:  Family History  Problem Relation Age of Onset   Diabetes Mellitus II Mother    Heart failure Mother    Hypertension Mother    Heart disease  Mother    Heart failure Father    Kidney disease Father    Heart disease Father    Congestive Heart Failure Neg Hx     Past Medical History:  Diagnosis Date   Acute pain of left foot 05/03/2017   Asthma    Asthma, chronic, mild persistent, uncomplicated 86/76/7209   Charcot ankle, left 05/31/2017   CHF (congestive heart failure) (HCC)    Chronic systolic (congestive) heart failure ([place])    Diabetes mellitus type 2 in obese ([place]) 04/21/2014   Diabetic polyneuropathy associated with type 2 diabetes mellitus ([place]) 03/14/2017   Essential hypertension 10/26/2016   Hypertension    Obesity    Obesity, Class III, BMI 40-49.9 (morbid obesity) ([place]) 10/26/2016   OSA (obstructive sleep apnea) 12/02/2016   Type 2 diabetes mellitus with diabetic neuropathy (HCC)     Current Outpatient Medications  Medication Sig Dispense Refill   Accu-Chek Softclix Lancets lancets Check blood sugar 3x a day (Patient taking differently: 1 each by Other route See admin instructions. Check blood sugar 3x a day) 100 each 11   albuterol (VENTOLIN HFA) 108 (90 Base) MCG/ACT inhaler Inhale 2 puffs into the lungs every 4 (four) hours as needed for wheezing or shortness of breath. 18 g 1   amiodarone (PACERONE) 200 MG tablet Take 1 tablet (200 mg total) by mouth 2 (two) times daily. 60 tablet 2   apixaban (ELIQUIS) 5 MG TABS tablet Take 1 tablet (5 mg total) by mouth 2 (two) times daily. 60 tablet 2   azelastine (ASTELIN) 0.1 % nasal spray Place 2 sprays into both nostrils 2 (two) times daily. Use in each nostril as directed (Patient taking differently: Place 2 sprays into both nostrils 2 (two) times daily. As needed) 30 mL 5   blood glucose meter kit and supplies KIT Dispense based on patient and insurance preference. Use up to four times daily as directed. (FOR ICD-9 250.00, 250.01). For QAC - HS accuchecks. May switch to any brand. (Patient taking differently: Inject 1 each into the skin See admin instructions. Dispense  based on patient and insurance preference. Use up to four times daily as directed. (FOR ICD-9 250.00, 250.01). For QAC - HS accuchecks. May switch to any brand.) 1 each 1   Blood Glucose Monitoring Suppl (ACCU-CHEK GUIDE) w/Device KIT Check blood sugar 3 times a day (Patient taking differently: 1 each by Other route See admin instructions. Check blood sugar 3 times a day) 1 kit 1   digoxin (LANOXIN) 0.125 MG tablet Take 1 tablet (0.125 mg total) by mouth daily. 90 tablet 3   Dulaglutide (TRULICITY) 1.5 OB/0.9GG SOPN Inject 1.5 mg into the skin once a week. 2 mL 2   gabapentin (NEURONTIN) 300 MG capsule Take  1 capsule (300 mg total) by mouth 3 (three) times daily. (Patient taking differently: Take 300 mg by mouth 3 (three) times daily as needed (neuropathy.).) 90 capsule 2   glucose blood (ACCU-CHEK GUIDE) test strip Check blood sugar 3 times per day (Patient taking differently: 1 each by Other route See admin instructions. Check blood sugar 3 times per day) 100 each 11   insulin aspart (NOVOLOG) 100 UNIT/ML injection Inject 20 Units into the skin 3 (three) times daily with meals. 20 mL 11   insulin glargine (LANTUS) 100 UNIT/ML injection Inject 0.3 mLs (30 Units total) into the skin 2 (two) times daily. 20 mL 11   Insulin Syringe-Needle U-100 30G X 1/2" 1 ML MISC Use to inject insulin 4 times a day. 100 each 2   isosorbide-hydrALAZINE (BIDIL) 20-37.5 MG tablet Take 1 tablet by mouth 3 (three) times daily. 90 tablet 2   levocetirizine (XYZAL) 5 MG tablet Take 1 tablet (5 mg total) by mouth every evening. 30 tablet 5   metolazone (ZAROXOLYN) 2.5 MG tablet Take 1 tablet (2.5 mg total) by mouth daily. 30 tablet 2   milrinone (PRIMACOR) 20 MG/100 ML SOLN infusion Inject 0.0622 mg/min into the vein continuous.     nystatin powder Apply 1 application topically 3 (three) times daily. (Patient taking differently: Apply 1 application topically 3 (three) times daily as needed (rash).) 15 g 0   potassium chloride  SA (KLOR-CON) 20 MEQ tablet Take 3 tablets (60 mEq total) by mouth 2 (two) times daily. 180 tablet 2   torsemide (DEMADEX) 20 MG tablet Take 4 tablets (80 mg total) by mouth 2 (two) times daily. 240 tablet 2   fluticasone (FLONASE) 50 MCG/ACT nasal spray Apply 2 sprays each nostril daily for 1-2 weeks (Patient taking differently: Place 2 sprays into both nostrils See admin instructions. Apply 2 sprays each nostril daily for 1-2 weeks (for mold allergies) as needed) 16 g 3   No current facility-administered medications for this encounter.    Vitals:   11/20/20 1025  BP: 124/76  Pulse: (!) 116  SpO2: 97%  Weight: (!) 154.2 kg (340 lb)   Wt Readings from Last 3 Encounters:  11/20/20 (!) 154.2 kg (340 lb)  11/12/20 (!) 158.4 kg (349 lb 3.2 oz)  10/16/20 (!) 179.2 kg (395 lb 1.6 oz)     PHYSICAL EXAM: General:  Walked in the clinic with milrinone pump. No resp difficulty HEENT: normal Neck: supple. JVP flat. Carotids 2+ bilaterally; no bruits. No lymphadenopathy or thryomegaly appreciated. Cor: PMI normal. Tachy Regular rate & rhythm. No rubs, gallops or murmurs. Lungs: clear Abdomen: soft, nontender, nondistended. No hepatosplenomegaly. No bruits or masses. Good bowel sounds. Extremities: no cyanosis, clubbing, rash, edema. RUE double lumen PICC . PICC line looks stable.  Neuro: alert & orientedx3, cranial nerves grossly intact. Moves all 4 extremities w/o difficulty. Affect pleasant.   ECG: Sinus Tach 115 bpm personally reviewed   ASSESSMENT & PLAN: End Stage HFrEF , NICM  -ECHO back in 2021 EF 30-35 % with moderate RV dysfunction in the setting of recurrent RBBB with significant dyssynchrony. Echo 10/2020  EF < 20% and moderate RV dysfunction - - Failed milrinone wean. Discharged on milrinone 0.375 cg . - NYHA IIID--> on milrinone 0.375 mcg. Volume status stable.  He was discharged on daily metolazone instead of Monday & Friday. Today I will change metolazone to Monday and Friday  and will start on 11/24/20. Check BMET, CBC, and Mag today.   -  Continue Torsemide to 80 mg BID.  - Intolerant SGLT2i due to candidiasis +uncontrolled DM - Continue spiro 25 mg daily - Continue digoxin 0.125. Dig level 0.5  - Continue bidil to 1 tablet TID - Off ARNI due to AKI. Can consider adding back at f/u if renal function remains stable. Check BMET  - No beta blocker d/t low output - Not candidate for VAD or transplant currently due to size and Hgba1c > 14   - Continue home milirinone. He is aware we are not sure how long inotrope therapy will provide benefit.    2. Uncontrolled DM -9/1 Hgb A1C > 14.  -Per PCP   3. CKD Stage IIIb -Check BMET today.    4. Atrial Flutter  - EKG today --> Sinsu Tach. Cut back amio 200 mg daily  - Continue eliquis 5 mg twice a day.    5. OSA: Severe OSA AHI 65  6. Obesity -Body mass index is 43.65 kg/m. -Discussed portion control.   7.  GOC -Followed by [facility] for Hospice Services.    8.  H/O medical noncompliance -He is taking all medications. Encouraged to continue.   9. Sinus Tach -Remains in Sinus Tach. No change.    Follow up in 2 weeks with APP and 3 months with Dr Haroldine Laws   Jonathan Bartol NP-C  11:09 AM

## 2020-11-20 ENCOUNTER — Other Ambulatory Visit: Payer: Self-pay

## 2020-11-20 ENCOUNTER — Ambulatory Visit (HOSPITAL_COMMUNITY)
Admission: RE | Admit: 2020-11-20 | Discharge: 2020-11-20 | Disposition: A | Discharge status: Home / Self Care | Payer: Medicaid Other | Source: Ambulatory Visit | Attending: Adult Health | Admitting: Adult Health

## 2020-11-20 ENCOUNTER — Encounter (HOSPITAL_COMMUNITY): Payer: Self-pay

## 2020-11-20 ENCOUNTER — Telehealth: Payer: Self-pay | Admitting: Internal Medicine

## 2020-11-20 ENCOUNTER — Other Ambulatory Visit (HOSPITAL_COMMUNITY): Payer: Self-pay | Admitting: Cardiology

## 2020-11-20 VITALS — BP 124/76 | HR 116 | Wt 340.0 lb

## 2020-11-20 DIAGNOSIS — Z6841 Body Mass Index (BMI) 40.0 and over, adult: Secondary | ICD-10-CM | POA: Diagnosis not present

## 2020-11-20 DIAGNOSIS — I130 Hypertensive heart and chronic kidney disease with heart failure and stage 1 through stage 4 chronic kidney disease, or unspecified chronic kidney disease: Secondary | ICD-10-CM | POA: Insufficient documentation

## 2020-11-20 DIAGNOSIS — Z66 Do not resuscitate: Secondary | ICD-10-CM | POA: Diagnosis not present

## 2020-11-20 DIAGNOSIS — Z8249 Family history of ischemic heart disease and other diseases of the circulatory system: Secondary | ICD-10-CM | POA: Diagnosis not present

## 2020-11-20 DIAGNOSIS — I5022 Chronic systolic (congestive) heart failure: Secondary | ICD-10-CM | POA: Diagnosis not present

## 2020-11-20 DIAGNOSIS — E1165 Type 2 diabetes mellitus with hyperglycemia: Secondary | ICD-10-CM | POA: Insufficient documentation

## 2020-11-20 DIAGNOSIS — I428 Other cardiomyopathies: Secondary | ICD-10-CM | POA: Diagnosis not present

## 2020-11-20 DIAGNOSIS — Z7901 Long term (current) use of anticoagulants: Secondary | ICD-10-CM | POA: Insufficient documentation

## 2020-11-20 DIAGNOSIS — G4733 Obstructive sleep apnea (adult) (pediatric): Secondary | ICD-10-CM | POA: Diagnosis not present

## 2020-11-20 DIAGNOSIS — Z833 Family history of diabetes mellitus: Secondary | ICD-10-CM | POA: Insufficient documentation

## 2020-11-20 DIAGNOSIS — E1122 Type 2 diabetes mellitus with diabetic chronic kidney disease: Secondary | ICD-10-CM | POA: Diagnosis not present

## 2020-11-20 DIAGNOSIS — R000 Tachycardia, unspecified: Secondary | ICD-10-CM | POA: Insufficient documentation

## 2020-11-20 DIAGNOSIS — N1832 Chronic kidney disease, stage 3b: Secondary | ICD-10-CM | POA: Diagnosis not present

## 2020-11-20 DIAGNOSIS — Z7985 Long-term (current) use of injectable non-insulin antidiabetic drugs: Secondary | ICD-10-CM | POA: Diagnosis not present

## 2020-11-20 DIAGNOSIS — I4892 Unspecified atrial flutter: Secondary | ICD-10-CM | POA: Insufficient documentation

## 2020-11-20 DIAGNOSIS — Z79899 Other long term (current) drug therapy: Secondary | ICD-10-CM | POA: Diagnosis not present

## 2020-11-20 DIAGNOSIS — I509 Heart failure, unspecified: Secondary | ICD-10-CM | POA: Diagnosis not present

## 2020-11-20 DIAGNOSIS — Z794 Long term (current) use of insulin: Secondary | ICD-10-CM | POA: Insufficient documentation

## 2020-11-20 DIAGNOSIS — I10 Essential (primary) hypertension: Secondary | ICD-10-CM | POA: Diagnosis not present

## 2020-11-20 DIAGNOSIS — I4510 Unspecified right bundle-branch block: Secondary | ICD-10-CM | POA: Diagnosis not present

## 2020-11-20 DIAGNOSIS — F32A Depression, unspecified: Secondary | ICD-10-CM | POA: Diagnosis not present

## 2020-11-20 DIAGNOSIS — R109 Unspecified abdominal pain: Secondary | ICD-10-CM | POA: Diagnosis not present

## 2020-11-20 DIAGNOSIS — E119 Type 2 diabetes mellitus without complications: Secondary | ICD-10-CM | POA: Diagnosis not present

## 2020-11-20 DIAGNOSIS — Z515 Encounter for palliative care: Secondary | ICD-10-CM | POA: Diagnosis not present

## 2020-11-20 LAB — CBC
HCT: 48.8 % (ref 39.0–52.0)
Hemoglobin: 16.8 g/dL (ref 13.0–17.0)
MCH: 30.8 pg (ref 26.0–34.0)
MCHC: 34.4 g/dL (ref 30.0–36.0)
MCV: 89.4 fL (ref 80.0–100.0)
Platelets: 260 10*3/uL (ref 150–400)
RBC: 5.46 MIL/uL (ref 4.22–5.81)
RDW: 13 % (ref 11.5–15.5)
WBC: 7.6 10*3/uL (ref 4.0–10.5)
nRBC: 0 % (ref 0.0–0.2)

## 2020-11-20 LAB — BASIC METABOLIC PANEL
Anion gap: 15 (ref 5–15)
BUN: 76 mg/dL — ABNORMAL HIGH (ref 6–20)
CO2: 30 mmol/L (ref 22–32)
Calcium: 10 mg/dL (ref 8.9–10.3)
Chloride: 83 mmol/L — ABNORMAL LOW (ref 98–111)
Creatinine, Ser: 2.08 mg/dL — ABNORMAL HIGH (ref 0.61–1.24)
GFR, Estimated: 42 mL/min — ABNORMAL LOW (ref >60)
Glucose, Bld: 215 mg/dL — ABNORMAL HIGH (ref 70–99)
Potassium: 2.7 mmol/L — CL (ref 3.5–5.1)
Sodium: 128 mmol/L — ABNORMAL LOW (ref 135–145)

## 2020-11-20 LAB — MAGNESIUM: Magnesium: 2.6 mg/dL — ABNORMAL HIGH (ref 1.7–2.4)

## 2020-11-20 MED ORDER — AMIODARONE HCL 200 MG PO TABS: 200.0000 mg | ORAL_TABLET | Freq: Every day | ORAL | 2 refills | Status: DC

## 2020-11-20 MED ORDER — METOLAZONE 2.5 MG PO TABS: 2.5000 mg | ORAL_TABLET | ORAL | 2 refills | Status: DC

## 2020-11-20 NOTE — Telephone Encounter (Cosign Needed)
I spoke with [name] in the presence of Dr. Criselda Peaches, MD to inquire about his transition back home. He was admitted on 10/28/20 with acute on chronic heart failure, and I saw him throughout his hospitalization. I informed the patient on 9/28, the day of discharge, that we would be calling him to follow-up and see how he is doing, and he was agreeable.   He said his transition back home has been going well. He had a cardiology appointment this morning and said this went alright. He was told that his potassium is still low, so he will need to have his labs checked again. This was frustrating to him because as much as he tries to have good intake, he has to pee out all that he takes in and then some because he has continued on Lasix. He has not been feeling dehydrated. He has been going to his follow-up appointments said he will have an appointment with his PCP, Dr. Sande Brothers, MD, next week.   He said he has been able to take his medications and is creating a consistent regimen. He said home health comes out once a week to change his dressing and give milrinone. He is also able to call them when he needs them. He said he did have an instance where his milrinone ran out, and he felt terrible. He said he noticed bubbles in the IV line and had to wait a few hours to get more, and he felt awful while waiting.  Patient said he is trying to exercise every day. He walks outside with his wife and dog. He said there is a hill nearby, and he tried to walk up the hill and made it halfway up. His goal is to make a full lap around the block in the next few days.  He is in good spirits. He said being in the hospital for a prolonged period of time was challenging for him emotionally given past familial experiences, and he is glad to be back in the home environment. He was home with his wife and mom when we spoke, and he has good support. Glad to hear he is doing well, and we will continue monitoring his progress.

## 2020-11-20 NOTE — Patient Instructions (Signed)
EKG was done today  Labs were performed today  CHANGE Metolazone to 2 times a week, Mondays and Fridays Strarting 11/24/2020  DECREASE Amiodarone to 200 mg once daily  Your physician recommends that you schedule a follow-up appointment in: 2 weeks and in 3 months  At the [facility], you and your health needs are our priority. As part of our continuing mission to provide you with exceptional heart care, we have created designated Provider Care Teams. These Care Teams include your primary Cardiologist (physician) and Advanced Practice Providers (APPs- Physician Assistants and Nurse Practitioners) who all work together to provide you with the care you need, when you need it.   You may see any of the following providers on your designated Care Team at your next follow up: Dr Arvilla Meres Dr Marca Ancona Dr Brandon Melnick, NP Robbie Lis, [place] Mikki Santee Karle Plumber, PharmD   Please be sure to bring in all your medications bottles to every appointment.    If you have any questions or concerns before your next appointment please send [place] a message through [place] or call our office at [phone].    TO LEAVE A MESSAGE FOR THE NURSE SELECT OPTION 2, PLEASE LEAVE A MESSAGE INCLUDING: YOUR NAME DATE OF BIRTH CALL BACK NUMBER REASON FOR CALL**this is important as we prioritize the call backs  YOU WILL RECEIVE A CALL BACK THE SAME DAY AS LONG AS YOU CALL BEFORE 4:00 PM

## 2020-11-20 NOTE — Telephone Encounter (Signed)
Attestation for Student Documentation:  I personally was present and performed or re-performed the history and medical decision-making activities of this service and have verified that the service and findings are accurately documented in the student's note.  Inez Catalina, MD 11/20/2020, 3:04 PM

## 2020-11-21 DIAGNOSIS — Z515 Encounter for palliative care: Secondary | ICD-10-CM | POA: Diagnosis not present

## 2020-11-21 DIAGNOSIS — E119 Type 2 diabetes mellitus without complications: Secondary | ICD-10-CM | POA: Diagnosis not present

## 2020-11-21 DIAGNOSIS — I10 Essential (primary) hypertension: Secondary | ICD-10-CM | POA: Diagnosis not present

## 2020-11-21 DIAGNOSIS — R109 Unspecified abdominal pain: Secondary | ICD-10-CM | POA: Diagnosis not present

## 2020-11-21 DIAGNOSIS — F32A Depression, unspecified: Secondary | ICD-10-CM | POA: Diagnosis not present

## 2020-11-21 DIAGNOSIS — G4733 Obstructive sleep apnea (adult) (pediatric): Secondary | ICD-10-CM | POA: Diagnosis not present

## 2020-11-21 DIAGNOSIS — I509 Heart failure, unspecified: Secondary | ICD-10-CM | POA: Diagnosis not present

## 2020-11-22 DIAGNOSIS — I509 Heart failure, unspecified: Secondary | ICD-10-CM | POA: Diagnosis not present

## 2020-11-22 DIAGNOSIS — Z515 Encounter for palliative care: Secondary | ICD-10-CM | POA: Diagnosis not present

## 2020-11-22 DIAGNOSIS — I10 Essential (primary) hypertension: Secondary | ICD-10-CM | POA: Diagnosis not present

## 2020-11-22 DIAGNOSIS — F32A Depression, unspecified: Secondary | ICD-10-CM | POA: Diagnosis not present

## 2020-11-22 DIAGNOSIS — G4733 Obstructive sleep apnea (adult) (pediatric): Secondary | ICD-10-CM | POA: Diagnosis not present

## 2020-11-22 DIAGNOSIS — E119 Type 2 diabetes mellitus without complications: Secondary | ICD-10-CM | POA: Diagnosis not present

## 2020-11-22 DIAGNOSIS — R109 Unspecified abdominal pain: Secondary | ICD-10-CM | POA: Diagnosis not present

## 2020-11-23 DIAGNOSIS — R109 Unspecified abdominal pain: Secondary | ICD-10-CM | POA: Diagnosis not present

## 2020-11-23 DIAGNOSIS — E119 Type 2 diabetes mellitus without complications: Secondary | ICD-10-CM | POA: Diagnosis not present

## 2020-11-23 DIAGNOSIS — F32A Depression, unspecified: Secondary | ICD-10-CM | POA: Diagnosis not present

## 2020-11-23 DIAGNOSIS — G4733 Obstructive sleep apnea (adult) (pediatric): Secondary | ICD-10-CM | POA: Diagnosis not present

## 2020-11-23 DIAGNOSIS — Z515 Encounter for palliative care: Secondary | ICD-10-CM | POA: Diagnosis not present

## 2020-11-23 DIAGNOSIS — I509 Heart failure, unspecified: Secondary | ICD-10-CM | POA: Diagnosis not present

## 2020-11-23 DIAGNOSIS — I10 Essential (primary) hypertension: Secondary | ICD-10-CM | POA: Diagnosis not present

## 2020-11-24 ENCOUNTER — Other Ambulatory Visit (HOSPITAL_COMMUNITY): Payer: Medicaid Other

## 2020-11-24 ENCOUNTER — Encounter (HOSPITAL_COMMUNITY): Payer: Self-pay

## 2020-11-24 DIAGNOSIS — Z515 Encounter for palliative care: Secondary | ICD-10-CM | POA: Diagnosis not present

## 2020-11-24 DIAGNOSIS — G4733 Obstructive sleep apnea (adult) (pediatric): Secondary | ICD-10-CM | POA: Diagnosis not present

## 2020-11-24 DIAGNOSIS — R109 Unspecified abdominal pain: Secondary | ICD-10-CM | POA: Diagnosis not present

## 2020-11-24 DIAGNOSIS — F32A Depression, unspecified: Secondary | ICD-10-CM | POA: Diagnosis not present

## 2020-11-24 DIAGNOSIS — I10 Essential (primary) hypertension: Secondary | ICD-10-CM | POA: Diagnosis not present

## 2020-11-24 DIAGNOSIS — I509 Heart failure, unspecified: Secondary | ICD-10-CM | POA: Diagnosis not present

## 2020-11-24 DIAGNOSIS — E119 Type 2 diabetes mellitus without complications: Secondary | ICD-10-CM | POA: Diagnosis not present

## 2020-11-25 ENCOUNTER — Emergency Department (HOSPITAL_COMMUNITY)
Admission: EM | Admit: 2020-11-25 | Discharge: 2020-11-26 | Discharge status: Against Medical Advice | Payer: Medicaid Other | Attending: Emergency Medicine | Admitting: Emergency Medicine

## 2020-11-25 ENCOUNTER — Other Ambulatory Visit: Payer: Self-pay

## 2020-11-25 DIAGNOSIS — E119 Type 2 diabetes mellitus without complications: Secondary | ICD-10-CM | POA: Insufficient documentation

## 2020-11-25 DIAGNOSIS — R5383 Other fatigue: Secondary | ICD-10-CM | POA: Insufficient documentation

## 2020-11-25 DIAGNOSIS — Z79899 Other long term (current) drug therapy: Secondary | ICD-10-CM | POA: Diagnosis not present

## 2020-11-25 DIAGNOSIS — T829XXA Unspecified complication of cardiac and vascular prosthetic device, implant and graft, initial encounter: Secondary | ICD-10-CM | POA: Diagnosis not present

## 2020-11-25 DIAGNOSIS — Z515 Encounter for palliative care: Secondary | ICD-10-CM | POA: Diagnosis not present

## 2020-11-25 DIAGNOSIS — Z7901 Long term (current) use of anticoagulants: Secondary | ICD-10-CM | POA: Insufficient documentation

## 2020-11-25 DIAGNOSIS — Z7951 Long term (current) use of inhaled steroids: Secondary | ICD-10-CM | POA: Diagnosis not present

## 2020-11-25 DIAGNOSIS — R0902 Hypoxemia: Secondary | ICD-10-CM | POA: Diagnosis not present

## 2020-11-25 DIAGNOSIS — I5022 Chronic systolic (congestive) heart failure: Secondary | ICD-10-CM | POA: Insufficient documentation

## 2020-11-25 DIAGNOSIS — I509 Heart failure, unspecified: Secondary | ICD-10-CM | POA: Diagnosis not present

## 2020-11-25 DIAGNOSIS — J4530 Mild persistent asthma, uncomplicated: Secondary | ICD-10-CM | POA: Diagnosis not present

## 2020-11-25 DIAGNOSIS — I110 Hypertensive heart disease with heart failure: Secondary | ICD-10-CM | POA: Insufficient documentation

## 2020-11-25 DIAGNOSIS — T82598A Other mechanical complication of other cardiac and vascular devices and implants, initial encounter: Secondary | ICD-10-CM

## 2020-11-25 DIAGNOSIS — G4733 Obstructive sleep apnea (adult) (pediatric): Secondary | ICD-10-CM | POA: Diagnosis not present

## 2020-11-25 DIAGNOSIS — T82594A Other mechanical complication of infusion catheter, initial encounter: Secondary | ICD-10-CM | POA: Insufficient documentation

## 2020-11-25 DIAGNOSIS — R109 Unspecified abdominal pain: Secondary | ICD-10-CM | POA: Diagnosis not present

## 2020-11-25 DIAGNOSIS — Z794 Long term (current) use of insulin: Secondary | ICD-10-CM | POA: Diagnosis not present

## 2020-11-25 DIAGNOSIS — I10 Essential (primary) hypertension: Secondary | ICD-10-CM | POA: Diagnosis not present

## 2020-11-25 DIAGNOSIS — F32A Depression, unspecified: Secondary | ICD-10-CM | POA: Diagnosis not present

## 2020-11-25 MED ORDER — MILRINONE LACTATE IN DEXTROSE 20-5 MG/100ML-% IV SOLN
0.2500 ug/kg/min | INTRAVENOUS | Status: DC
  Administered 2020-11-25 – 2020-11-26 (×2): 0.25 ug/kg/min via INTRAVENOUS
  Filled 2020-11-25 (×2): qty 100

## 2020-11-25 NOTE — ED Provider Notes (Signed)
[facility] EMERGENCY DEPARTMENT Provider Note   CSN: [id number] Arrival date & time: 11/25/20  2052     History Chief Complaint  Patient presents with   Vascular Access Problem    Jonathan [name] is a 35 y.o. male.  Jonathan [name] is a 35 y.o. male with significant past medical history including nonischemic cardiomyopathy with EF < 20%, uncontrolled diabetes, hypertension, asthma, obesity Charcot foot, sleep apnea, who presents to the emergency department  for PICC line issue. Patient had PICC line placed last Wednesday for  continuous milrinone infusion. Today the pump said "air in line" and medication was not able to be administered.  He kept seeing bubbles in the line and Getting the repeated error message when he tried to fix it.  He called [facility] home health and they said that they were over 2 hours out and that he should come to the emergency department since he is not able to be without this medication.  Currently he endorses generalized fatigue and feeling "sluggish" which is chronic and unchanged. Denies chest pain, shortness of breath, lightheadedness, or changes to vision.  He reports overall he has been doing well since his recent hospital admission for fluid overload.  The history is provided by the patient and medical records.      Past Medical History:  Diagnosis Date   Acute pain of left foot 05/03/2017   Asthma    Asthma, chronic, mild persistent, uncomplicated 81/02/7508   Charcot ankle, left 05/31/2017   CHF (congestive heart failure) (HCC)    Chronic systolic (congestive) heart failure ([place])    Diabetes mellitus type 2 in obese ([place]) 04/21/2014   Diabetic polyneuropathy associated with type 2 diabetes mellitus ([place]) 03/14/2017   Essential hypertension 10/26/2016   Hypertension    Obesity    Obesity, Class III, BMI 40-49.9 (morbid obesity) ([place]) 10/26/2016   OSA (obstructive sleep apnea) 12/02/2016   Type 2 diabetes mellitus with diabetic  neuropathy [facility])     Patient Active Problem List   Diagnosis Date Noted   Diabetes mellitus ([place])    Goals of care, counseling/discussion    Acute on chronic heart failure ([place]) 10/28/2020   Intertrigo 10/17/2020   Abdominal pain 08/20/2020   Mold exposure 06/17/2020   Candidiasis of penis 12/08/2018   Depression 11/06/2017   Charcot ankle, left 05/31/2017   Acute pain of left foot 05/03/2017   Diabetic polyneuropathy associated with type 2 diabetes mellitus ([place]) 03/14/2017   OSA (obstructive sleep apnea) 25/85/2778   Chronic systolic (congestive) heart failure ([place])    Morbid obesity with BMI of 50.0-59.9, adult ([place]) 10/26/2016   Asthma, chronic, mild persistent, uncomplicated 24/23/5361   Essential hypertension 10/26/2016   Diabetes mellitus type 2 in obese ([place]) 04/20/2016    Past Surgical History:  Procedure Laterality Date   CARDIAC CATHETERIZATION     FOOT SURGERY     arch surgery       Family History  Problem Relation Age of Onset   Diabetes Mellitus II Mother    Heart failure Mother    Hypertension Mother    Heart disease Mother    Heart failure Father    Kidney disease Father    Heart disease Father    Congestive Heart Failure Neg Hx     Social History   Tobacco Use   Smoking status: Never   Smokeless tobacco: Never  Vaping Use   Vaping Use: Never used  Substance Use Topics   Alcohol use: No  Drug use: No    Home Medications Prior to Admission medications   Medication Sig Start Date End Date Taking? Authorizing Provider  Accu-Chek Softclix Lancets lancets Check blood sugar 3x a day Patient taking differently: 1 each by Other route See admin instructions. Check blood sugar 3x a day 06/23/20   Asencion Noble, MD  albuterol (VENTOLIN HFA) 108 (90 Base) MCG/ACT inhaler Inhale 2 puffs into the lungs every 4 (four) hours as needed for wheezing or shortness of breath. 09/05/20   Kennith Gain, MD  amiodarone (PACERONE) 200 MG tablet  Take 1 tablet (200 mg total) by mouth daily. 11/20/20   Clegg, Amy D, NP  apixaban (ELIQUIS) 5 MG TABS tablet Take 1 tablet (5 mg total) by mouth 2 (two) times daily. 11/12/20   Farrel Gordon, DO  azelastine (ASTELIN) 0.1 % nasal spray Place 2 sprays into both nostrils 2 (two) times daily. Use in each nostril as directed Patient taking differently: Place 2 sprays into both nostrils 2 (two) times daily. As needed 09/05/20   Kennith Gain, MD  blood glucose meter kit and supplies KIT Dispense based on patient and insurance preference. Use up to four times daily as directed. (FOR ICD-9 250.00, 250.01). For QAC - HS accuchecks. May switch to any brand. Patient taking differently: Inject 1 each into the skin See admin instructions. Dispense based on patient and insurance preference. Use up to four times daily as directed. (FOR ICD-9 250.00, 250.01). For QAC - HS accuchecks. May switch to any brand. 10/29/16   Thurnell Lose, MD  Blood Glucose Monitoring Suppl (ACCU-CHEK GUIDE) w/Device KIT Check blood sugar 3 times a day Patient taking differently: 1 each by Other route See admin instructions. Check blood sugar 3 times a day 06/23/20   [name], MD  digoxin (LANOXIN) 0.125 MG tablet Take 1 tablet (0.125 mg total) by mouth daily. 11/09/19   Bensimhon, Shaune Pascal, MD  Dulaglutide (TRULICITY) 1.5 WN/4.6EV SOPN Inject 1.5 mg into the skin once a week. 10/17/20   [name], MD  fluticasone [facility]) 50 MCG/ACT nasal spray Apply 2 sprays each nostril daily for 1-2 weeks Patient taking differently: Place 2 sprays into both nostrils See admin instructions. Apply 2 sprays each nostril daily for 1-2 weeks (for mold allergies) as needed 09/05/20   Kennith Gain, MD  gabapentin (NEURONTIN) 300 MG capsule Take 1 capsule (300 mg total) by mouth 3 (three) times daily. Patient taking differently: Take 300 mg by mouth 3 (three) times daily as needed (neuropathy.). 08/13/20 08/13/21  [name],  MD  glucose blood (ACCU-CHEK GUIDE) test strip Check blood sugar 3 times per day Patient taking differently: 1 each by Other route See admin instructions. Check blood sugar 3 times per day 06/23/20   [name], MD  insulin aspart (NOVOLOG) 100 UNIT/ML injection Inject 20 Units into the skin 3 (three) times daily with meals. 11/12/20   Farrel Gordon, DO  insulin glargine (LANTUS) 100 UNIT/ML injection Inject 0.3 mLs (30 Units total) into the skin 2 (two) times daily. 11/12/20   Farrel Gordon, DO  Insulin Syringe-Needle U-100 30G X 1/2" 1 ML MISC Use to inject insulin 4 times a day. 11/12/20   Sid Falcon, MD  isosorbide-hydrALAZINE (BIDIL) 20-37.5 MG tablet Take 1 tablet by mouth 3 (three) times daily. 11/12/20   Farrel Gordon, DO  levocetirizine (XYZAL) 5 MG tablet Take 1 tablet (5 mg total) by mouth every evening. 09/05/20   Kennith Gain, MD  metolazone (ZAROXOLYN) 2.5 MG tablet Take 1 tablet (2.5 mg total) by mouth 2 (two) times a week. Take 2 times a week on Mondays and Fridays starting 11/24/2020 11/20/20   Darrick Grinder D, NP  milrinone (PRIMACOR) 20 MG/100 ML SOLN infusion Inject 0.0622 mg/min into the vein continuous. 11/12/20   Farrel Gordon, DO  nystatin powder Apply 1 application topically 3 (three) times daily. Patient taking differently: Apply 1 application topically 3 (three) times daily as needed (rash). 10/16/20   [name], MD  potassium chloride SA (KLOR-CON) 20 MEQ tablet Take 3 tablets (60 mEq total) by mouth 2 (two) times daily. 11/12/20   Farrel Gordon, DO  torsemide (DEMADEX) 20 MG tablet Take 4 tablets (80 mg total) by mouth 2 (two) times daily. 11/12/20   Farrel Gordon, DO    Allergies    Hydrocodone  Review of Systems   Review of Systems  Constitutional:  Positive for fatigue. Negative for chills and fever.  Respiratory:  Negative for shortness of breath.   Cardiovascular:  Negative for chest pain and leg swelling.  Gastrointestinal:  Negative for abdominal pain and  nausea.  Musculoskeletal:  Negative for myalgias.  Skin:  Negative for color change and rash.  Neurological:  Negative for syncope and light-headedness.  All other systems reviewed and are negative.  Physical Exam Updated Vital Signs BP 136/80 (BP Location: Left Arm)   Pulse (!) 115   Temp 98.5 F (36.9 C) (Oral)   Resp 18   Ht 6' 2"  (1.88 m)   Wt (!) 154.2 kg   SpO2 99%   BMI 43.65 kg/m   Physical Exam Vitals and nursing note reviewed.  Constitutional:      General: He is not in acute distress.    Appearance: Normal appearance. He is well-developed. He is obese. He is not ill-appearing or diaphoretic.  HENT:     Head: Normocephalic and atraumatic.  Eyes:     General:        Right eye: No discharge.        Left eye: No discharge.  Cardiovascular:     Rate and Rhythm: Normal rate and regular rhythm.     Heart sounds: Normal heart sounds.  Pulmonary:     Effort: Pulmonary effort is normal. No respiratory distress.     Breath sounds: Normal breath sounds.     Comments: Respirations equal and unlabored, patient able to speak in full sentences, lungs clear to auscultation bilaterally, breath sounds diminished in the bases. Abdominal:     General: There is no distension.     Palpations: Abdomen is soft. There is no mass.     Tenderness: There is no abdominal tenderness. There is no guarding.  Musculoskeletal:     Cervical back: Neck supple.     Right lower leg: No edema.     Left lower leg: No edema.     Comments: PICC line present in right upper arm, dressing intact, no erythema  Skin:    General: Skin is warm and dry.  Neurological:     Mental Status: He is alert and oriented to person, place, and time.     Coordination: Coordination normal.  Psychiatric:        Mood and Affect: Mood normal.        Behavior: Behavior normal.    ED Results / Procedures / Treatments   Labs (all labs ordered are listed, but only abnormal results are displayed) Labs Reviewed - No  data to  display  EKG None  Radiology No results found.  Procedures Procedures   Medications Ordered in ED Medications  milrinone (PRIMACOR) 20 MG/100 ML (0.2 mg/mL) infusion (0.25 mcg/kg/min  154.2 kg Intravenous New Bag/Given 11/25/20 2207)    ED Course  I have reviewed the triage vital signs and the nursing notes.  Pertinent labs & imaging results that were available during my care of the patient were reviewed by me and considered in my medical decision making (see chart for details).    MDM Rules/Calculators/A&P                           35 year old male presents with issues with his home infusion pump.  Due to severe heart failure he is on continuous milrinone infusion and has not been able to be weaned off of this medication.  He has a PICC line and uses a home pump to infuse this medicine but today started noticing air bubbles in the pump line and Getting an error message for Erin line.  He tried to troubleshoot this at home without resolution and called his [facility] home health nurse who recommended that he come to the ED.  Patient reports overall since his recent hospital admission his heart failure symptoms have been significantly improved and he currently denies any chest pain or shortness of breath.  He is in sinus tach on arrival at 115 which appears to be his baseline.  No EKG changes noted.  Aside from pump issues he denies any other acute complaints.  I have spoke with case management who has spoke with his home health service as well as the [facility] infusion nurse, they will not be able to come out until tomorrow morning to correct this issue.  PICC line team here at the hospital does not have tubing or infusion pumps like this to correct this from the hospital.  Patient has been restarted on his milrinone on hospital IV pump and will have to remain in the emergency department until home infusion nurse can come tomorrow to fix pump issue.  Pt and wife have been  updated on plan for pt to remain in ED overnight to continue to receive milrinone infusion until pump issue can be resolved. Care signed out to PA Quincy Carnes who will pass pt on to morning team pending resolution of home infusion pump.  Final Clinical Impression(s) / ED Diagnoses Final diagnoses:  Dysfunction of intravenous infusion line, initial encounter [facility])    Rx / DC Orders ED Discharge Orders     None        Janet Berlin 11/26/20 0016    Godfrey Pick, MD 11/26/20 [id number]

## 2020-11-25 NOTE — ED Triage Notes (Signed)
Pt bib [facility] because for PICC line problems. Pt has PICC line in place since last Wednesday for milrinone. Pt states his medication would not flow and says the pump says "air in line" and is here for evaluation of PICC line. Hx CHF and afib.

## 2020-11-25 NOTE — Care Management (Signed)
Patient presented to ED due to issues with  Milrinone drip .  Patient is active with [name] care.  Patient states that pump was beeping with error message air int the line.  Patient contacted [facility] and nurse advised patient to come to the ED because they were more than 2-3 hours away.  ED RN CM contacted Jeri Modena liaison with [facility]. Updated Pam on situation , Pam will follow up in am with patient in the ED. Updated EDP and CED Consulting civil engineer.  Updated patient that he will have to remain in the ED until issue is resolved.

## 2020-11-26 ENCOUNTER — Other Ambulatory Visit (HOSPITAL_COMMUNITY): Payer: Self-pay

## 2020-11-26 ENCOUNTER — Telehealth: Payer: Medicaid Other | Admitting: Internal Medicine

## 2020-11-26 ENCOUNTER — Telehealth (HOSPITAL_COMMUNITY): Payer: Self-pay | Admitting: Pharmacist

## 2020-11-26 DIAGNOSIS — I10 Essential (primary) hypertension: Secondary | ICD-10-CM | POA: Diagnosis not present

## 2020-11-26 DIAGNOSIS — Z515 Encounter for palliative care: Secondary | ICD-10-CM | POA: Diagnosis not present

## 2020-11-26 DIAGNOSIS — R109 Unspecified abdominal pain: Secondary | ICD-10-CM | POA: Diagnosis not present

## 2020-11-26 DIAGNOSIS — F32A Depression, unspecified: Secondary | ICD-10-CM | POA: Diagnosis not present

## 2020-11-26 DIAGNOSIS — E119 Type 2 diabetes mellitus without complications: Secondary | ICD-10-CM | POA: Diagnosis not present

## 2020-11-26 DIAGNOSIS — G4733 Obstructive sleep apnea (adult) (pediatric): Secondary | ICD-10-CM | POA: Diagnosis not present

## 2020-11-26 DIAGNOSIS — I509 Heart failure, unspecified: Secondary | ICD-10-CM | POA: Diagnosis not present

## 2020-11-26 NOTE — Discharge Planning (Signed)
RNCM contacted Time Neva Seat of [facility] and Jeri Modena, RN of [facility] to visit with pt and family prior to discharge today.

## 2020-11-26 NOTE — ED Notes (Signed)
ED provider is talking with family member regarding infusion pump.

## 2020-11-26 NOTE — Telephone Encounter (Signed)
Pharmacy Transitions of Care Follow-up Telephone Call  Date of discharge: 11/12/20  Discharge Diagnosis: chronic HF with a.flutter (eliquis indication)  How have you been since you were released from the hospital? Pt has been to ED since discharge (11/26/20) but left AMA.  He also missed his PCP appt today.  Lastly, he has been placed on hospice.  Medication changes made at discharge:  - START:  BiDil (isosorbide-hydrALAZINE)  Eliquis (apixaban)  Lantus (insulin glargine)  milrinone (PRIMACOR)  NovoLOG (insulin aspart)  UltiCare Insulin Syringe (Insulin Syringe-Needle U-100)   - STOPPED:  carvedilol 25 MG tablet (Coreg)  Entresto 97-103 MG (sacubitril-valsartan)  fluconazole 100 MG tablet (Diflucan)  guaiFENesin-codeine 100-10 MG/5ML syrup  metolazone 2.5 MG tablet (ZAROXOLYN)  Olopatadine HCl 0.2 % Soln  ondansetron 4 MG disintegrating tablet (Zofran ODT)  ondansetron 4 MG tablet (Zofran)   - CHANGED:  potassium chloride SA (KLOR-CON)  torsemide (DEMADEX)   Using freestyle libre and wants to continue, requires PA, encouraged pt to talk to PCP at IM clinic for rx and refills.   I will also reach out to Dr. Sande Brothers to consider changing insulin vials to pens for improved compliance.   Medication changes verified by the patient? Yes    Medication Accessibility:  Home Pharmacy: [facility] [address], [place]   Was the patient provided with refills on discharged medications? Yes   Have all prescriptions been transferred from [facility] to home pharmacy?  yes  Is the patient able to afford medications? Has insurance Notable copays: $4 medicaid copays Eligible patient assistance: n/a    Medication Review:   APIXABAN (ELIQUIS)  Apixaban 5 mg BID initiated on 11/06/20 and continued on discharge  - Discussed importance of taking medication around the same time everyday  - Reviewed potential DDIs with patient  - Advised patient of medications to avoid (NSAIDs, ASA)  - Educated  that Tylenol (acetaminophen) will be the preferred analgesic to prevent risk of bleeding  - Emphasized importance of monitoring for signs and symptoms of bleeding (abnormal bruising, prolonged bleeding, nose bleeds, bleeding from gums, discolored urine, black tarry stools)  - Advised patient to alert all providers of anticoagulation therapy prior to starting a new medication or having a procedure    Follow-up Appointments:  PCP Hospital f/u appt confirmed? Missed his telehealth hospital followup with Dr. Dolan Amen on 11/26/20 at 1:15pm today, but I have encouraged him to call to reschedule asap.  Specialist Hospital f/u appt confirmed?  Scheduled to see [facility] on 12/09/20 @ 10:30 am.   If their condition worsens, is the pt aware to call PCP or go to the Emergency Dept.? yes  Final Patient Assessment: Pt admitted to some non-compliance with medications.  We have reviewed the importance of his meds and have provided encouragement to take these daily and as prescribed.  I have asked pt to 1)reschedule appt with IM clinic for hosp follow-up, 2) improve med compliance, 3) keep scheduled appt with cardiologist.

## 2020-11-26 NOTE — ED Notes (Signed)
Milrinone infusion unhooked by family member. Refused to allow RN to assess or flush, stating they are leaving the ED AMA. Dr Rosalia Hammers notified.

## 2020-11-26 NOTE — ED Provider Notes (Signed)
Pt was here waiting for the pump person and the hospice person to come.  Wife was getting very irritated they had to wait for so long.  I spoke to the case manager and she said they were on the way and I relayed this to the wife.  The wife said they can get a hospice nurse to come out to the house today and she and the patient left before I could talk to them again.   Jacalyn Lefevre, MD 11/26/20 1011

## 2020-11-26 NOTE — ED Notes (Signed)
Patient left AMA with family member. Infusion pump liaison has not arrived. Patient refused to wait for MD to prepare AMA paperwork or sign AMA form. Ambulated without difficulty out of ED. No distress noted. ED provider, Dr Rosalia Hammers, notified and aware.

## 2020-11-26 NOTE — Discharge Planning (Signed)
Jonathan [name] of [facility] visited with family and developed a disposition plan.

## 2020-11-26 NOTE — ED Notes (Signed)
Patient is sleeping, spouse at bedside.

## 2020-11-27 DIAGNOSIS — G4733 Obstructive sleep apnea (adult) (pediatric): Secondary | ICD-10-CM | POA: Diagnosis not present

## 2020-11-27 DIAGNOSIS — F32A Depression, unspecified: Secondary | ICD-10-CM | POA: Diagnosis not present

## 2020-11-27 DIAGNOSIS — Z515 Encounter for palliative care: Secondary | ICD-10-CM | POA: Diagnosis not present

## 2020-11-27 DIAGNOSIS — I509 Heart failure, unspecified: Secondary | ICD-10-CM | POA: Diagnosis not present

## 2020-11-27 DIAGNOSIS — E119 Type 2 diabetes mellitus without complications: Secondary | ICD-10-CM | POA: Diagnosis not present

## 2020-11-27 DIAGNOSIS — R109 Unspecified abdominal pain: Secondary | ICD-10-CM | POA: Diagnosis not present

## 2020-11-27 DIAGNOSIS — I10 Essential (primary) hypertension: Secondary | ICD-10-CM | POA: Diagnosis not present

## 2020-11-28 DIAGNOSIS — I10 Essential (primary) hypertension: Secondary | ICD-10-CM | POA: Diagnosis not present

## 2020-11-28 DIAGNOSIS — E119 Type 2 diabetes mellitus without complications: Secondary | ICD-10-CM | POA: Diagnosis not present

## 2020-11-28 DIAGNOSIS — F32A Depression, unspecified: Secondary | ICD-10-CM | POA: Diagnosis not present

## 2020-11-28 DIAGNOSIS — I509 Heart failure, unspecified: Secondary | ICD-10-CM | POA: Diagnosis not present

## 2020-11-28 DIAGNOSIS — R109 Unspecified abdominal pain: Secondary | ICD-10-CM | POA: Diagnosis not present

## 2020-11-28 DIAGNOSIS — G4733 Obstructive sleep apnea (adult) (pediatric): Secondary | ICD-10-CM | POA: Diagnosis not present

## 2020-11-28 DIAGNOSIS — Z515 Encounter for palliative care: Secondary | ICD-10-CM | POA: Diagnosis not present

## 2020-11-29 DIAGNOSIS — I10 Essential (primary) hypertension: Secondary | ICD-10-CM | POA: Diagnosis not present

## 2020-11-29 DIAGNOSIS — Z515 Encounter for palliative care: Secondary | ICD-10-CM | POA: Diagnosis not present

## 2020-11-29 DIAGNOSIS — R109 Unspecified abdominal pain: Secondary | ICD-10-CM | POA: Diagnosis not present

## 2020-11-29 DIAGNOSIS — F32A Depression, unspecified: Secondary | ICD-10-CM | POA: Diagnosis not present

## 2020-11-29 DIAGNOSIS — G4733 Obstructive sleep apnea (adult) (pediatric): Secondary | ICD-10-CM | POA: Diagnosis not present

## 2020-11-29 DIAGNOSIS — I509 Heart failure, unspecified: Secondary | ICD-10-CM | POA: Diagnosis not present

## 2020-11-29 DIAGNOSIS — E119 Type 2 diabetes mellitus without complications: Secondary | ICD-10-CM | POA: Diagnosis not present

## 2020-11-30 DIAGNOSIS — I10 Essential (primary) hypertension: Secondary | ICD-10-CM | POA: Diagnosis not present

## 2020-11-30 DIAGNOSIS — R109 Unspecified abdominal pain: Secondary | ICD-10-CM | POA: Diagnosis not present

## 2020-11-30 DIAGNOSIS — Z515 Encounter for palliative care: Secondary | ICD-10-CM | POA: Diagnosis not present

## 2020-11-30 DIAGNOSIS — G4733 Obstructive sleep apnea (adult) (pediatric): Secondary | ICD-10-CM | POA: Diagnosis not present

## 2020-11-30 DIAGNOSIS — I509 Heart failure, unspecified: Secondary | ICD-10-CM | POA: Diagnosis not present

## 2020-11-30 DIAGNOSIS — E119 Type 2 diabetes mellitus without complications: Secondary | ICD-10-CM | POA: Diagnosis not present

## 2020-11-30 DIAGNOSIS — F32A Depression, unspecified: Secondary | ICD-10-CM | POA: Diagnosis not present

## 2020-12-01 DIAGNOSIS — I509 Heart failure, unspecified: Secondary | ICD-10-CM | POA: Diagnosis not present

## 2020-12-01 DIAGNOSIS — E119 Type 2 diabetes mellitus without complications: Secondary | ICD-10-CM | POA: Diagnosis not present

## 2020-12-01 DIAGNOSIS — I10 Essential (primary) hypertension: Secondary | ICD-10-CM | POA: Diagnosis not present

## 2020-12-01 DIAGNOSIS — Z515 Encounter for palliative care: Secondary | ICD-10-CM | POA: Diagnosis not present

## 2020-12-01 DIAGNOSIS — F32A Depression, unspecified: Secondary | ICD-10-CM | POA: Diagnosis not present

## 2020-12-01 DIAGNOSIS — G4733 Obstructive sleep apnea (adult) (pediatric): Secondary | ICD-10-CM | POA: Diagnosis not present

## 2020-12-01 DIAGNOSIS — R109 Unspecified abdominal pain: Secondary | ICD-10-CM | POA: Diagnosis not present

## 2020-12-02 DIAGNOSIS — I10 Essential (primary) hypertension: Secondary | ICD-10-CM | POA: Diagnosis not present

## 2020-12-02 DIAGNOSIS — E119 Type 2 diabetes mellitus without complications: Secondary | ICD-10-CM | POA: Diagnosis not present

## 2020-12-02 DIAGNOSIS — I509 Heart failure, unspecified: Secondary | ICD-10-CM | POA: Diagnosis not present

## 2020-12-02 DIAGNOSIS — G4733 Obstructive sleep apnea (adult) (pediatric): Secondary | ICD-10-CM | POA: Diagnosis not present

## 2020-12-02 DIAGNOSIS — F32A Depression, unspecified: Secondary | ICD-10-CM | POA: Diagnosis not present

## 2020-12-02 DIAGNOSIS — R109 Unspecified abdominal pain: Secondary | ICD-10-CM | POA: Diagnosis not present

## 2020-12-02 DIAGNOSIS — Z515 Encounter for palliative care: Secondary | ICD-10-CM | POA: Diagnosis not present

## 2020-12-03 DIAGNOSIS — I509 Heart failure, unspecified: Secondary | ICD-10-CM | POA: Diagnosis not present

## 2020-12-03 DIAGNOSIS — F32A Depression, unspecified: Secondary | ICD-10-CM | POA: Diagnosis not present

## 2020-12-03 DIAGNOSIS — Z515 Encounter for palliative care: Secondary | ICD-10-CM | POA: Diagnosis not present

## 2020-12-03 DIAGNOSIS — R109 Unspecified abdominal pain: Secondary | ICD-10-CM | POA: Diagnosis not present

## 2020-12-03 DIAGNOSIS — G4733 Obstructive sleep apnea (adult) (pediatric): Secondary | ICD-10-CM | POA: Diagnosis not present

## 2020-12-03 DIAGNOSIS — I10 Essential (primary) hypertension: Secondary | ICD-10-CM | POA: Diagnosis not present

## 2020-12-03 DIAGNOSIS — E119 Type 2 diabetes mellitus without complications: Secondary | ICD-10-CM | POA: Diagnosis not present

## 2020-12-04 DIAGNOSIS — I10 Essential (primary) hypertension: Secondary | ICD-10-CM | POA: Diagnosis not present

## 2020-12-04 DIAGNOSIS — Z515 Encounter for palliative care: Secondary | ICD-10-CM | POA: Diagnosis not present

## 2020-12-04 DIAGNOSIS — E119 Type 2 diabetes mellitus without complications: Secondary | ICD-10-CM | POA: Diagnosis not present

## 2020-12-04 DIAGNOSIS — G4733 Obstructive sleep apnea (adult) (pediatric): Secondary | ICD-10-CM | POA: Diagnosis not present

## 2020-12-04 DIAGNOSIS — R109 Unspecified abdominal pain: Secondary | ICD-10-CM | POA: Diagnosis not present

## 2020-12-04 DIAGNOSIS — I509 Heart failure, unspecified: Secondary | ICD-10-CM | POA: Diagnosis not present

## 2020-12-04 DIAGNOSIS — F32A Depression, unspecified: Secondary | ICD-10-CM | POA: Diagnosis not present

## 2020-12-05 DIAGNOSIS — F32A Depression, unspecified: Secondary | ICD-10-CM | POA: Diagnosis not present

## 2020-12-05 DIAGNOSIS — I509 Heart failure, unspecified: Secondary | ICD-10-CM | POA: Diagnosis not present

## 2020-12-05 DIAGNOSIS — Z515 Encounter for palliative care: Secondary | ICD-10-CM | POA: Diagnosis not present

## 2020-12-05 DIAGNOSIS — G4733 Obstructive sleep apnea (adult) (pediatric): Secondary | ICD-10-CM | POA: Diagnosis not present

## 2020-12-05 DIAGNOSIS — R109 Unspecified abdominal pain: Secondary | ICD-10-CM | POA: Diagnosis not present

## 2020-12-05 DIAGNOSIS — I10 Essential (primary) hypertension: Secondary | ICD-10-CM | POA: Diagnosis not present

## 2020-12-05 DIAGNOSIS — E119 Type 2 diabetes mellitus without complications: Secondary | ICD-10-CM | POA: Diagnosis not present

## 2020-12-06 DIAGNOSIS — R109 Unspecified abdominal pain: Secondary | ICD-10-CM | POA: Diagnosis not present

## 2020-12-06 DIAGNOSIS — E119 Type 2 diabetes mellitus without complications: Secondary | ICD-10-CM | POA: Diagnosis not present

## 2020-12-06 DIAGNOSIS — I509 Heart failure, unspecified: Secondary | ICD-10-CM | POA: Diagnosis not present

## 2020-12-06 DIAGNOSIS — I10 Essential (primary) hypertension: Secondary | ICD-10-CM | POA: Diagnosis not present

## 2020-12-06 DIAGNOSIS — F32A Depression, unspecified: Secondary | ICD-10-CM | POA: Diagnosis not present

## 2020-12-06 DIAGNOSIS — Z515 Encounter for palliative care: Secondary | ICD-10-CM | POA: Diagnosis not present

## 2020-12-06 DIAGNOSIS — G4733 Obstructive sleep apnea (adult) (pediatric): Secondary | ICD-10-CM | POA: Diagnosis not present

## 2020-12-07 DIAGNOSIS — R109 Unspecified abdominal pain: Secondary | ICD-10-CM | POA: Diagnosis not present

## 2020-12-07 DIAGNOSIS — Z515 Encounter for palliative care: Secondary | ICD-10-CM | POA: Diagnosis not present

## 2020-12-07 DIAGNOSIS — E119 Type 2 diabetes mellitus without complications: Secondary | ICD-10-CM | POA: Diagnosis not present

## 2020-12-07 DIAGNOSIS — G4733 Obstructive sleep apnea (adult) (pediatric): Secondary | ICD-10-CM | POA: Diagnosis not present

## 2020-12-07 DIAGNOSIS — F32A Depression, unspecified: Secondary | ICD-10-CM | POA: Diagnosis not present

## 2020-12-07 DIAGNOSIS — I10 Essential (primary) hypertension: Secondary | ICD-10-CM | POA: Diagnosis not present

## 2020-12-07 DIAGNOSIS — I509 Heart failure, unspecified: Secondary | ICD-10-CM | POA: Diagnosis not present

## 2020-12-08 ENCOUNTER — Other Ambulatory Visit: Payer: Self-pay | Admitting: Dietician

## 2020-12-08 DIAGNOSIS — E669 Obesity, unspecified: Secondary | ICD-10-CM

## 2020-12-08 DIAGNOSIS — F32A Depression, unspecified: Secondary | ICD-10-CM | POA: Diagnosis not present

## 2020-12-08 DIAGNOSIS — I10 Essential (primary) hypertension: Secondary | ICD-10-CM | POA: Diagnosis not present

## 2020-12-08 DIAGNOSIS — I509 Heart failure, unspecified: Secondary | ICD-10-CM | POA: Diagnosis not present

## 2020-12-08 DIAGNOSIS — G4733 Obstructive sleep apnea (adult) (pediatric): Secondary | ICD-10-CM | POA: Diagnosis not present

## 2020-12-08 DIAGNOSIS — E119 Type 2 diabetes mellitus without complications: Secondary | ICD-10-CM | POA: Diagnosis not present

## 2020-12-08 DIAGNOSIS — E1169 Type 2 diabetes mellitus with other specified complication: Secondary | ICD-10-CM

## 2020-12-08 DIAGNOSIS — R109 Unspecified abdominal pain: Secondary | ICD-10-CM | POA: Diagnosis not present

## 2020-12-08 DIAGNOSIS — Z515 Encounter for palliative care: Secondary | ICD-10-CM | POA: Diagnosis not present

## 2020-12-08 MED ORDER — FREESTYLE LIBRE 2 SENSOR MISC: 11 refills | Status: AC

## 2020-12-08 NOTE — Progress Notes (Signed)
PCP: [name], MD HF Cardiologist: Dr. Haroldine Laws   HPI: Mr Kia is a 35 y.o.with a history of chronic HFrEF, NICM, HTN, asthma, OSA, morbid obesity, and uncontrolled DM.    Admitted 10/2016 with progressive SOB x 2 weeks. He reports h/o systolic CHF with EF 14-48% by Echo in [place] in 2017. Cath around that time without any significant CAD per patient. He was diuresed with IV Lasix, discharge weight was 373 pounds. He had run out of most of his medications due to moving to [place] and not having insurance. Meds restarted at discharge: Coreg, Mearl Latin, Lasix.    He had repeat Echo 03/02/2017. EF had normalized to 55% but went back down when he was off HF meds. EF in 2021 back down to 30-35%. He has refused ICD.    Over the last year he has had ongoing difficulty with medication/dietary compliance resulting in volume overload. Managing diuretics in the community has been a challenge with frequent adjustments. At one time he was followed by HF paramedicine but was discharged due poor compliance.    He has been followed in the HF clinic and was last seen 05/2020.  He cancelled follow up appointments 08/2020 and 09/2020.    10/16/20 Hgb A1C > 14. Saw PCP last week and was volume overloaded and discussion about starting insulin. He was reluctant to start due to needle phobia.  Dr Gilford Rile noted he had not been able to tolerate his diuretics due to running out of his potassium supplement.  Weight at that time 395 pounds.   Admitted 10/28/20 with A/C HFrEF, A fib RVR , and marked volume overload. Hospital course complicated by low output so milrinone started. Diuresed with lasix drip + metolazone+ diamox.   Failed milrinone wean and was discharged on milrinone. He was not a candidate for advanced therapies due to noncompliance and uncontrolled diabetes. HGB A1C >14, Palliative consulted for goals of care. He elected DNR/DNI. Referred to [facility] for Hospice services.   Seen in ED 11/25/20 with home  milrinone pump issues. Switched to hospital pump and instructed to wait for [facility] RN to help troubleshoot. Left before Hospice nurse could fix pump.  Today he returns for HF follow up with his wife and mother. Overall feeling fine. No significant SOB with walking on flat ground. Denies CP, dizziness, edema, or PND/Orthopnea. Appetite ok, working on diet and avoiding junk foods. No fever or chills. Weight at home 340 pounds.  He has not been taking digoxin or spiro since his hospitalization, he is unsure why. Blood sugars have been controlled recently.  He does not wear cpap. No further issues with milrinone pump or PICC.  Cardiac Studies   - Echo 9/22: EF<20%, severely decreased LV, grade II DD.  - Echo 10/21: EF 30-35% with moderate RV dysfunction in setting of recurrent RBBB with significant dyssynchrony.  - Echo 04/2019: EF 30-35%  - Echo 2019: EF 55%   - Echo EF 15-20% in [place] in 2017. Cath around that time without   ROS: All systems negative except as listed in HPI, PMH and Problem List.  SH:  Social History   Socioeconomic History   Marital status: Married    Spouse name: Not on file   Number of children: Not on file   Years of education: Not on file   Highest education level: Not on file  Occupational History   Not on file  Tobacco Use   Smoking status: Never   Smokeless tobacco: Never  Vaping  Use   Vaping Use: Never used  Substance and Sexual Activity   Alcohol use: No   Drug use: No   Sexual activity: Never  Other Topics Concern   Not on file  Social History Narrative   Not on file   Social Determinants of Health   Financial Resource Strain: Low Risk    Difficulty of Paying Living Expenses: Not very hard  Food Insecurity: No Food Insecurity   Worried About Running Out of Food in the Last Year: Never true   Ran Out of Food in the Last Year: Never true  Transportation Needs: No Transportation Needs   Lack of Transportation (Medical): No   Lack of Transportation  (Non-Medical): No  Physical Activity: Not on file  Stress: Not on file  Social Connections: Not on file  Intimate Partner Violence: Not on file   FH:  Family History  Problem Relation Age of Onset   Diabetes Mellitus II Mother    Heart failure Mother    Hypertension Mother    Heart disease Mother    Heart failure Father    Kidney disease Father    Heart disease Father    Congestive Heart Failure Neg Hx    Past Medical History:  Diagnosis Date   Acute pain of left foot 05/03/2017   Asthma    Asthma, chronic, mild persistent, uncomplicated 70/96/2836   Charcot ankle, left 05/31/2017   CHF (congestive heart failure) (HCC)    Chronic systolic (congestive) heart failure ([place])    Diabetes mellitus type 2 in obese ([place]) 04/21/2014   Diabetic polyneuropathy associated with type 2 diabetes mellitus ([place]) 03/14/2017   Essential hypertension 10/26/2016   Hypertension    Obesity    Obesity, Class III, BMI 40-49.9 (morbid obesity) ([place]) 10/26/2016   OSA (obstructive sleep apnea) 12/02/2016   Type 2 diabetes mellitus with diabetic neuropathy (HCC)    Current Outpatient Medications  Medication Sig Dispense Refill   Accu-Chek Softclix Lancets lancets Check blood sugar 3x a day (Patient taking differently: 1 each by Other route See admin instructions. Check blood sugar 3x a day) 100 each 11   albuterol (VENTOLIN HFA) 108 (90 Base) MCG/ACT inhaler Inhale 2 puffs into the lungs every 4 (four) hours as needed for wheezing or shortness of breath. 18 g 1   amiodarone (PACERONE) 200 MG tablet Take 1 tablet (200 mg total) by mouth daily. 30 tablet 2   apixaban (ELIQUIS) 5 MG TABS tablet Take 1 tablet (5 mg total) by mouth 2 (two) times daily. 60 tablet 2   azelastine (ASTELIN) 0.1 % nasal spray Place 2 sprays into both nostrils 2 (two) times daily. Use in each nostril as directed (Patient taking differently: Place 2 sprays into both nostrils 2 (two) times daily. As needed) 30 mL 5   blood glucose  meter kit and supplies KIT Dispense based on patient and insurance preference. Use up to four times daily as directed. (FOR ICD-9 250.00, 250.01). For QAC - HS accuchecks. May switch to any brand. (Patient taking differently: Inject 1 each into the skin See admin instructions. Dispense based on patient and insurance preference. Use up to four times daily as directed. (FOR ICD-9 250.00, 250.01). For QAC - HS accuchecks. May switch to any brand.) 1 each 1   Blood Glucose Monitoring Suppl (ACCU-CHEK GUIDE) w/Device KIT Check blood sugar 3 times a day (Patient taking differently: 1 each by Other route See admin instructions. Check blood sugar 3 times a day) 1 kit  1   Continuous Blood Gluc Sensor (FREESTYLE LIBRE 2 SENSOR) MISC Use to check blood sugar at least 6 times a day 2 each 11   Dulaglutide (TRULICITY) 1.5 RF/1.6BW SOPN Inject 1.5 mg into the skin once a week. 2 mL 2   fluticasone (FLONASE) 50 MCG/ACT nasal spray Apply 2 sprays each nostril daily for 1-2 weeks (Patient taking differently: Place 2 sprays into both nostrils See admin instructions. Apply 2 sprays each nostril daily for 1-2 weeks (for mold allergies) as needed) 16 g 3   gabapentin (NEURONTIN) 300 MG capsule Take 1 capsule (300 mg total) by mouth 3 (three) times daily. 90 capsule 2   glucose blood (ACCU-CHEK GUIDE) test strip Check blood sugar 3 times per day (Patient taking differently: 1 each by Other route See admin instructions. Check blood sugar 3 times per day) 100 each 11   insulin aspart (NOVOLOG) 100 UNIT/ML injection Inject 20 Units into the skin 3 (three) times daily with meals. 20 mL 11   insulin glargine (LANTUS) 100 UNIT/ML injection Inject 0.3 mLs (30 Units total) into the skin 2 (two) times daily. 20 mL 11   Insulin Syringe-Needle U-100 30G X 1/2" 1 ML MISC Use to inject insulin 4 times a day. 100 each 2   isosorbide-hydrALAZINE (BIDIL) 20-37.5 MG tablet Take 1 tablet by mouth 3 (three) times daily. 90 tablet 2   metolazone  (ZAROXOLYN) 2.5 MG tablet Take 1 tablet (2.5 mg total) by mouth 2 (two) times a week. Take 2 times a week on Mondays and Fridays starting 11/24/2020 8 tablet 2   milrinone (PRIMACOR) 20 MG/100 ML SOLN infusion Inject 0.0622 mg/min into the vein continuous.     nystatin powder Apply 1 application topically 3 (three) times daily. (Patient taking differently: Apply 1 application topically 3 (three) times daily as needed (rash).) 15 g 0   potassium chloride SA (KLOR-CON) 20 MEQ tablet Take 3 tablets (60 mEq total) by mouth 2 (two) times daily. 180 tablet 2   torsemide (DEMADEX) 20 MG tablet Take 4 tablets (80 mg total) by mouth 2 (two) times daily. 240 tablet 2   No current facility-administered medications for this encounter.   BP (!) 128/98   Pulse (!) 111   Wt (!) 154.8 kg (341 lb 3.2 oz)   SpO2 99%   BMI 43.81 kg/m   Wt Readings from Last 3 Encounters:  12/09/20 (!) 154.8 kg (341 lb 3.2 oz)  11/25/20 (!) 154.2 kg (340 lb)  11/20/20 (!) 154.2 kg (340 lb)   PHYSICAL EXAM: General:  NAD. No resp difficulty, walked into clinic. HEENT: Normal Neck: Supple. Thick neck. Carotids 2+ bilat; no bruits. No lymphadenopathy or thryomegaly appreciated. Cor: PMI nondisplaced. Tachyrate & rhythm. No rubs, gallops or murmurs. Lungs: Clear Abdomen: Obese, nontender, nondistended. No hepatosplenomegaly. No bruits or masses. Good bowel sounds. Extremities: No cyanosis, clubbing, rash, edema; RUE PICC with milrinone pump. Neuro: Alert & oriented x 3, cranial nerves grossly intact. Moves all 4 extremities w/o difficulty. Affect pleasant.  ECG: ST with PACs 110 bpm (personally reviewed).  ASSESSMENT & PLAN: End Stage HFrEF , NICM  - Echo back in 2021 EF 30-35 % with moderate RV dysfunction in the setting of recurrent RBBB with significant dyssynchrony.  - Echo 10/2020 EF < 20% and moderate RV dysfunction - Failed milrinone wean. Discharged on milrinone 0.375 mg . - NYHA IIIb--> on milrinone 0.375 mcg.  Volume status stable. - Restart spironolacone 12.5 mg daily. - Restart digoxin 0.125  mg daily. - Continue Torsemide 80 mg bid +  metolazone 2.5 on Mondays and Fridays.  - Intolerant SGLT2i due to candidiasis +uncontrolled DM - Continue Bidil 1 tablet tid. - Off ARNI due to AKI. Can consider adding back at f/u if renal function remains stable.  - No beta blocker d/t low output - Not candidate for VAD or transplant currently due to size and Hgba1c > 14   - Continue home milirinone. He is aware we are not sure how long inotrope therapy will provide benefit.  - BMET today; BMET and dig level in 1 week to be drawn by [facility].   2. Uncontrolled DM - 9/1 Hgb A1C > 14.  - Per PCP. - No SGLT2i.   3. CKD Stage IIIb - BMET today.    4. Atrial Flutter  - EKG today --> Sinus Tach.  - Decrease amio to 100 mg daily.  - Continue Eliquis 5 mg bid. No bleeding issues  5. OSA: Severe OSA AHI 65 - Does not wear CPAP.  6. Obesity - Body mass index is 43.81 kg/m. - Discussed portion control. He is working on this.  7.  GOC - Followed by [facility] for Hospice Services.  - Discussed further the role of milrinone. - Wife asking about " next steps". Briefly discussed qualifications for transplant vs VAD.   8.  H/O medical noncompliance - Unclear why he is not taking spiro or dig but he is taking all other medications.  - Encouraged to continue.   9. Sinus Tach - Remains in Sinus Tach.  - No change.   Follow up in 4 weeks with APP (add back ARNi if able) and 3 months with Dr Haroldine Laws   Rafael Bihari FNP-BC 10:31 AM

## 2020-12-08 NOTE — Telephone Encounter (Signed)
Thanks for the update. I am approving it.

## 2020-12-08 NOTE — Telephone Encounter (Signed)
Wife calls requesting a prescription for the Freestyle libre CGM for this patient.

## 2020-12-09 ENCOUNTER — Ambulatory Visit (INDEPENDENT_AMBULATORY_CARE_PROVIDER_SITE_OTHER): Payer: Medicaid Other | Admitting: Internal Medicine

## 2020-12-09 ENCOUNTER — Ambulatory Visit (HOSPITAL_COMMUNITY)
Admission: RE | Admit: 2020-12-09 | Discharge: 2020-12-09 | Disposition: A | Discharge status: Home / Self Care | Payer: Medicaid Other | Source: Ambulatory Visit | Attending: Family Medicine | Admitting: Family Medicine

## 2020-12-09 ENCOUNTER — Other Ambulatory Visit: Payer: Self-pay

## 2020-12-09 ENCOUNTER — Encounter (HOSPITAL_COMMUNITY): Payer: Self-pay

## 2020-12-09 VITALS — BP 128/98 | HR 111 | Wt 341.2 lb

## 2020-12-09 DIAGNOSIS — E669 Obesity, unspecified: Secondary | ICD-10-CM | POA: Diagnosis not present

## 2020-12-09 DIAGNOSIS — Z91128 Patient's intentional underdosing of medication regimen for other reason: Secondary | ICD-10-CM | POA: Insufficient documentation

## 2020-12-09 DIAGNOSIS — I5084 End stage heart failure: Secondary | ICD-10-CM | POA: Insufficient documentation

## 2020-12-09 DIAGNOSIS — Z7189 Other specified counseling: Secondary | ICD-10-CM

## 2020-12-09 DIAGNOSIS — N183 Chronic kidney disease, stage 3 unspecified: Secondary | ICD-10-CM

## 2020-12-09 DIAGNOSIS — Z66 Do not resuscitate: Secondary | ICD-10-CM | POA: Diagnosis not present

## 2020-12-09 DIAGNOSIS — Z91199 Patient's noncompliance with other medical treatment and regimen due to unspecified reason: Secondary | ICD-10-CM | POA: Diagnosis not present

## 2020-12-09 DIAGNOSIS — R109 Unspecified abdominal pain: Secondary | ICD-10-CM | POA: Diagnosis not present

## 2020-12-09 DIAGNOSIS — Z794 Long term (current) use of insulin: Secondary | ICD-10-CM | POA: Diagnosis not present

## 2020-12-09 DIAGNOSIS — I5022 Chronic systolic (congestive) heart failure: Secondary | ICD-10-CM

## 2020-12-09 DIAGNOSIS — N1832 Chronic kidney disease, stage 3b: Secondary | ICD-10-CM | POA: Insufficient documentation

## 2020-12-09 DIAGNOSIS — T460X6A Underdosing of cardiac-stimulant glycosides and drugs of similar action, initial encounter: Secondary | ICD-10-CM | POA: Diagnosis not present

## 2020-12-09 DIAGNOSIS — I130 Hypertensive heart and chronic kidney disease with heart failure and stage 1 through stage 4 chronic kidney disease, or unspecified chronic kidney disease: Secondary | ICD-10-CM | POA: Diagnosis not present

## 2020-12-09 DIAGNOSIS — I4892 Unspecified atrial flutter: Secondary | ICD-10-CM | POA: Insufficient documentation

## 2020-12-09 DIAGNOSIS — G4733 Obstructive sleep apnea (adult) (pediatric): Secondary | ICD-10-CM | POA: Diagnosis not present

## 2020-12-09 DIAGNOSIS — E119 Type 2 diabetes mellitus without complications: Secondary | ICD-10-CM | POA: Diagnosis not present

## 2020-12-09 DIAGNOSIS — I4510 Unspecified right bundle-branch block: Secondary | ICD-10-CM | POA: Insufficient documentation

## 2020-12-09 DIAGNOSIS — R000 Tachycardia, unspecified: Secondary | ICD-10-CM | POA: Diagnosis not present

## 2020-12-09 DIAGNOSIS — T500X6A Underdosing of mineralocorticoids and their antagonists, initial encounter: Secondary | ICD-10-CM | POA: Diagnosis not present

## 2020-12-09 DIAGNOSIS — E1165 Type 2 diabetes mellitus with hyperglycemia: Secondary | ICD-10-CM | POA: Insufficient documentation

## 2020-12-09 DIAGNOSIS — E1142 Type 2 diabetes mellitus with diabetic polyneuropathy: Secondary | ICD-10-CM | POA: Insufficient documentation

## 2020-12-09 DIAGNOSIS — N179 Acute kidney failure, unspecified: Secondary | ICD-10-CM | POA: Insufficient documentation

## 2020-12-09 DIAGNOSIS — Z7901 Long term (current) use of anticoagulants: Secondary | ICD-10-CM | POA: Diagnosis not present

## 2020-12-09 DIAGNOSIS — I428 Other cardiomyopathies: Secondary | ICD-10-CM | POA: Diagnosis not present

## 2020-12-09 DIAGNOSIS — Z79899 Other long term (current) drug therapy: Secondary | ICD-10-CM | POA: Insufficient documentation

## 2020-12-09 DIAGNOSIS — E1122 Type 2 diabetes mellitus with diabetic chronic kidney disease: Secondary | ICD-10-CM | POA: Diagnosis not present

## 2020-12-09 DIAGNOSIS — F32A Depression, unspecified: Secondary | ICD-10-CM | POA: Diagnosis not present

## 2020-12-09 DIAGNOSIS — J45909 Unspecified asthma, uncomplicated: Secondary | ICD-10-CM | POA: Diagnosis not present

## 2020-12-09 DIAGNOSIS — Z7985 Long-term (current) use of injectable non-insulin antidiabetic drugs: Secondary | ICD-10-CM | POA: Insufficient documentation

## 2020-12-09 DIAGNOSIS — Z6841 Body Mass Index (BMI) 40.0 and over, adult: Secondary | ICD-10-CM | POA: Diagnosis not present

## 2020-12-09 DIAGNOSIS — E1169 Type 2 diabetes mellitus with other specified complication: Secondary | ICD-10-CM

## 2020-12-09 DIAGNOSIS — I509 Heart failure, unspecified: Secondary | ICD-10-CM | POA: Diagnosis not present

## 2020-12-09 DIAGNOSIS — I10 Essential (primary) hypertension: Secondary | ICD-10-CM | POA: Diagnosis not present

## 2020-12-09 DIAGNOSIS — Z515 Encounter for palliative care: Secondary | ICD-10-CM | POA: Diagnosis not present

## 2020-12-09 MED ORDER — AMIODARONE HCL 200 MG PO TABS: 100.0000 mg | ORAL_TABLET | Freq: Every day | ORAL | 2 refills | Status: DC

## 2020-12-09 MED ORDER — DIGOXIN 125 MCG PO TABS
0.1250 mg | ORAL_TABLET | Freq: Every day | ORAL | 3 refills | Status: DC
Start: 2020-12-09 — End: 2020-12-25

## 2020-12-09 MED ORDER — SPIRONOLACTONE 25 MG PO TABS: 12.5000 mg | ORAL_TABLET | Freq: Every day | ORAL | 3 refills | Status: DC

## 2020-12-09 NOTE — Progress Notes (Signed)
  [facility] Internal [facility] Residency Telephone Encounter Continuity Care Appointment  HPI:  This telephone encounter was created for Mr. [name] on 12/09/2020 for the following purpose/cc Hospital Follow Up.   Past Medical History:  Past Medical History:  Diagnosis Date   Acute pain of left foot 05/03/2017   Asthma    Asthma, chronic, mild persistent, uncomplicated 10/26/2016   Charcot ankle, left 05/31/2017   CHF (congestive heart failure) (HCC)    Chronic systolic (congestive) heart failure (HCC)    Diabetes mellitus type 2 in obese (HCC) 04/21/2014   Diabetic polyneuropathy associated with type 2 diabetes mellitus (HCC) 03/14/2017   Essential hypertension 10/26/2016   Hypertension    Obesity    Obesity, Class III, BMI 40-49.9 (morbid obesity) (HCC) 10/26/2016   OSA (obstructive sleep apnea) 12/02/2016   Type 2 diabetes mellitus with diabetic neuropathy (HCC)      ROS:  ROS    Assessment / Plan / Recommendations:  Please see A&P under problem oriented charting for assessment of the patient's acute and chronic medical conditions.  As always, pt is advised that if symptoms worsen or new symptoms arise, they should go to an urgent care facility or to to ER for further evaluation.   Consent and Medical Decision Making:  Patient discussed with Dr. Oswaldo Done This is a telephone encounter between [facility] and Dolan Amen on 12/09/2020 for Hospital Follow Up. The visit was conducted with the patient located at home and Dolan Amen at [facility]. The patient's identity was confirmed using their DOB and current address. The patient has consented to being evaluated through a telephone encounter and understands the associated risks (an examination cannot be done and the patient may need to come in for an appointment) / benefits (allows the patient to remain at home, decreasing exposure to coronavirus). I personally spent 15 minutes on medical discussion.

## 2020-12-09 NOTE — Patient Instructions (Signed)
RESTART Spironolactone 12.5 mg (one half tab) daily START Digoxin 0.125 mg, one tab daily DECREASE Amiodarone to 100 mg (one half tab) daily  Labs needed in one week by home health  Your physician recommends that you schedule a follow-up appointment in: 4 weeks  in the Advanced Practitioners (PA/NP) Clinic and in 3 months with Dr Gala Romney   Do the following things EVERYDAY: Weigh yourself in the morning before breakfast. Write it down and keep it in a log. Take your medicines as prescribed Eat low salt foods--Limit salt (sodium) to 2000 mg per day.  Stay as active as you can everyday Limit all fluids for the day to less than 2 liters  At the Advanced Heart Failure Clinic, you and your health needs are our priority. As part of our continuing mission to provide you with exceptional heart care, we have created designated Provider Care Teams. These Care Teams include your primary Cardiologist (physician) and Advanced Practice Providers (APPs- Physician Assistants and Nurse Practitioners) who all work together to provide you with the care you need, when you need it.   You may see any of the following providers on your designated Care Team at your next follow up: Dr Arvilla Meres Dr Carron Curie, NP Robbie Lis, [place] [facility] [place], [place] Karle Plumber, PharmD   Please be sure to bring in all your medications bottles to every appointment.   If you have any questions or concerns before your next appointment please send [place] a message through [place] or call our office at [phone].    TO LEAVE A MESSAGE FOR THE NURSE SELECT OPTION 2, PLEASE LEAVE A MESSAGE INCLUDING: YOUR NAME DATE OF BIRTH CALL BACK NUMBER REASON FOR CALL**this is important as we prioritize the call backs  YOU WILL RECEIVE A CALL BACK THE SAME DAY AS LONG AS YOU CALL BEFORE 4:00 PM

## 2020-12-10 ENCOUNTER — Encounter: Payer: Self-pay | Admitting: Internal Medicine

## 2020-12-10 DIAGNOSIS — I10 Essential (primary) hypertension: Secondary | ICD-10-CM | POA: Diagnosis not present

## 2020-12-10 DIAGNOSIS — R109 Unspecified abdominal pain: Secondary | ICD-10-CM | POA: Diagnosis not present

## 2020-12-10 DIAGNOSIS — F32A Depression, unspecified: Secondary | ICD-10-CM | POA: Diagnosis not present

## 2020-12-10 DIAGNOSIS — I509 Heart failure, unspecified: Secondary | ICD-10-CM | POA: Diagnosis not present

## 2020-12-10 DIAGNOSIS — E119 Type 2 diabetes mellitus without complications: Secondary | ICD-10-CM | POA: Diagnosis not present

## 2020-12-10 DIAGNOSIS — Z515 Encounter for palliative care: Secondary | ICD-10-CM | POA: Diagnosis not present

## 2020-12-10 DIAGNOSIS — G4733 Obstructive sleep apnea (adult) (pediatric): Secondary | ICD-10-CM | POA: Diagnosis not present

## 2020-12-10 DIAGNOSIS — I4892 Unspecified atrial flutter: Secondary | ICD-10-CM | POA: Insufficient documentation

## 2020-12-10 NOTE — Progress Notes (Signed)
Internal Medicine Clinic Attending ? ?Case discussed with Dr. Winters  At the time of the visit.  We reviewed the resident?s history and exam and pertinent patient test results.  I agree with the assessment, diagnosis, and plan of care documented in the resident?s note.  ?

## 2020-12-10 NOTE — Assessment & Plan Note (Signed)
During his recent hospital admission, Jonathan [name] was found to be in sinus tachycardia and atrial flutter.  He was given loading dose of amiodarone and started on amiodarone 200 mg twice daily and Eliquis 5 mg twice daily.  His most recent cardiology appointment and his amiodarone was decreased to 100 mg daily, and he was continued on his Eliquis 5 mg twice daily. - Continue to follow-up with cardiology - Continue Eliquis 5 mg twice daily - Can continue amiodarone 100 mg daily

## 2020-12-10 NOTE — Assessment & Plan Note (Signed)
Patient with Hx of TIIDM presenting for a telehealth appointment. His last A1c was elevated >14. He is currently on Lantus 30 units twice daily, Aspart 20 units 3 times daily with meals, and dulaglutide 1.5 mg weekly since his hospitalization. Jonathan [name] states that his sugars are much improved with his current regimen. He states that his sugars are typically in the 120-180 range. He states that he has had one hypoglycemic episode with a sugar of 69 and he experienced a headache during this episode. He additionally notes that right when he gets out of bed in the mornings his sugar will increase to the 240s-250s, but if he checks his sugars from the hours of 3:00-6:00 AM, they are typically in the 120-180 range.   A/P:  Jonathan [name] is doing well on his current regimen. Discussed that in light of his late night/early morning glucose levels are normal and it spikes shortly after waking up that this is likely the effects of cortisol. He is compliant with his current medications. He was unable to afford the Freestyle Libre 2.0 sensor, but is regularly checking his glucose levels. He has experienced one hypoglycemic event. He is additionally working out more. He has a new, 3 month old pitbull that he walked approximately a quarter mile a day, which he states he was unable to do in the past. We will assess Jonathan [name]'s A1c at his next office visit.  - Continue current regimen - Will reach out to the diabetes coordinator for CBG monitor - A1c at next office visit.

## 2020-12-10 NOTE — Assessment & Plan Note (Addendum)
Presents for telehealth visit after a recent hospitalization.  During his hospital stay, his ejection fraction was found to be less than 20% from 30 to 35%.  He was discharged on digoxin 0.125 mg tablet daily, milrinone 0.375 MCG/KG/minute, spironolactone 12.5 mg daily, torsemide 80 mg twice daily, metolazone 2.5 mg on Mondays and Fridays, and BiDil 20-37.5 mg 3 times daily. Patient states that he has been taking the medications listed, he was unable to get his spironolactone or digoxin at discharge, but it was ordered today he will be picking it up.  Otherwise, he has been compliant with all of his medications and experience no side effects.  On chart review, he has been consistently euvolemic on exam with cardiology. - Continue current regimen - Follow-up with cardiology

## 2020-12-11 DIAGNOSIS — F32A Depression, unspecified: Secondary | ICD-10-CM | POA: Diagnosis not present

## 2020-12-11 DIAGNOSIS — I509 Heart failure, unspecified: Secondary | ICD-10-CM | POA: Diagnosis not present

## 2020-12-11 DIAGNOSIS — Z515 Encounter for palliative care: Secondary | ICD-10-CM | POA: Diagnosis not present

## 2020-12-11 DIAGNOSIS — I10 Essential (primary) hypertension: Secondary | ICD-10-CM | POA: Diagnosis not present

## 2020-12-11 DIAGNOSIS — G4733 Obstructive sleep apnea (adult) (pediatric): Secondary | ICD-10-CM | POA: Diagnosis not present

## 2020-12-11 DIAGNOSIS — R109 Unspecified abdominal pain: Secondary | ICD-10-CM | POA: Diagnosis not present

## 2020-12-11 DIAGNOSIS — E119 Type 2 diabetes mellitus without complications: Secondary | ICD-10-CM | POA: Diagnosis not present

## 2020-12-12 DIAGNOSIS — R109 Unspecified abdominal pain: Secondary | ICD-10-CM | POA: Diagnosis not present

## 2020-12-12 DIAGNOSIS — E119 Type 2 diabetes mellitus without complications: Secondary | ICD-10-CM | POA: Diagnosis not present

## 2020-12-12 DIAGNOSIS — Z515 Encounter for palliative care: Secondary | ICD-10-CM | POA: Diagnosis not present

## 2020-12-12 DIAGNOSIS — G4733 Obstructive sleep apnea (adult) (pediatric): Secondary | ICD-10-CM | POA: Diagnosis not present

## 2020-12-12 DIAGNOSIS — I509 Heart failure, unspecified: Secondary | ICD-10-CM | POA: Diagnosis not present

## 2020-12-12 DIAGNOSIS — I10 Essential (primary) hypertension: Secondary | ICD-10-CM | POA: Diagnosis not present

## 2020-12-12 DIAGNOSIS — F32A Depression, unspecified: Secondary | ICD-10-CM | POA: Diagnosis not present

## 2020-12-13 DIAGNOSIS — G4733 Obstructive sleep apnea (adult) (pediatric): Secondary | ICD-10-CM | POA: Diagnosis not present

## 2020-12-13 DIAGNOSIS — F32A Depression, unspecified: Secondary | ICD-10-CM | POA: Diagnosis not present

## 2020-12-13 DIAGNOSIS — E119 Type 2 diabetes mellitus without complications: Secondary | ICD-10-CM | POA: Diagnosis not present

## 2020-12-13 DIAGNOSIS — I10 Essential (primary) hypertension: Secondary | ICD-10-CM | POA: Diagnosis not present

## 2020-12-13 DIAGNOSIS — I509 Heart failure, unspecified: Secondary | ICD-10-CM | POA: Diagnosis not present

## 2020-12-13 DIAGNOSIS — Z515 Encounter for palliative care: Secondary | ICD-10-CM | POA: Diagnosis not present

## 2020-12-13 DIAGNOSIS — R109 Unspecified abdominal pain: Secondary | ICD-10-CM | POA: Diagnosis not present

## 2020-12-14 ENCOUNTER — Other Ambulatory Visit: Payer: Self-pay | Admitting: Internal Medicine

## 2020-12-14 DIAGNOSIS — I509 Heart failure, unspecified: Secondary | ICD-10-CM | POA: Diagnosis not present

## 2020-12-14 DIAGNOSIS — Z515 Encounter for palliative care: Secondary | ICD-10-CM | POA: Diagnosis not present

## 2020-12-14 DIAGNOSIS — E119 Type 2 diabetes mellitus without complications: Secondary | ICD-10-CM | POA: Diagnosis not present

## 2020-12-14 DIAGNOSIS — R109 Unspecified abdominal pain: Secondary | ICD-10-CM | POA: Diagnosis not present

## 2020-12-14 DIAGNOSIS — G4733 Obstructive sleep apnea (adult) (pediatric): Secondary | ICD-10-CM | POA: Diagnosis not present

## 2020-12-14 DIAGNOSIS — I10 Essential (primary) hypertension: Secondary | ICD-10-CM | POA: Diagnosis not present

## 2020-12-14 DIAGNOSIS — F32A Depression, unspecified: Secondary | ICD-10-CM | POA: Diagnosis not present

## 2020-12-15 DIAGNOSIS — R109 Unspecified abdominal pain: Secondary | ICD-10-CM | POA: Diagnosis not present

## 2020-12-15 DIAGNOSIS — Z515 Encounter for palliative care: Secondary | ICD-10-CM | POA: Diagnosis not present

## 2020-12-15 DIAGNOSIS — I10 Essential (primary) hypertension: Secondary | ICD-10-CM | POA: Diagnosis not present

## 2020-12-15 DIAGNOSIS — F32A Depression, unspecified: Secondary | ICD-10-CM | POA: Diagnosis not present

## 2020-12-15 DIAGNOSIS — G4733 Obstructive sleep apnea (adult) (pediatric): Secondary | ICD-10-CM | POA: Diagnosis not present

## 2020-12-15 DIAGNOSIS — I509 Heart failure, unspecified: Secondary | ICD-10-CM | POA: Diagnosis not present

## 2020-12-15 DIAGNOSIS — E119 Type 2 diabetes mellitus without complications: Secondary | ICD-10-CM | POA: Diagnosis not present

## 2020-12-16 DIAGNOSIS — R600 Localized edema: Secondary | ICD-10-CM | POA: Diagnosis not present

## 2020-12-16 DIAGNOSIS — F32A Depression, unspecified: Secondary | ICD-10-CM | POA: Diagnosis not present

## 2020-12-16 DIAGNOSIS — G4733 Obstructive sleep apnea (adult) (pediatric): Secondary | ICD-10-CM | POA: Diagnosis not present

## 2020-12-16 DIAGNOSIS — I10 Essential (primary) hypertension: Secondary | ICD-10-CM | POA: Diagnosis not present

## 2020-12-16 DIAGNOSIS — E119 Type 2 diabetes mellitus without complications: Secondary | ICD-10-CM | POA: Diagnosis not present

## 2020-12-16 DIAGNOSIS — I5022 Chronic systolic (congestive) heart failure: Secondary | ICD-10-CM | POA: Diagnosis not present

## 2020-12-16 DIAGNOSIS — Z515 Encounter for palliative care: Secondary | ICD-10-CM | POA: Diagnosis not present

## 2020-12-16 DIAGNOSIS — R109 Unspecified abdominal pain: Secondary | ICD-10-CM | POA: Diagnosis not present

## 2020-12-16 DIAGNOSIS — E11610 Type 2 diabetes mellitus with diabetic neuropathic arthropathy: Secondary | ICD-10-CM | POA: Diagnosis not present

## 2020-12-16 DIAGNOSIS — I509 Heart failure, unspecified: Secondary | ICD-10-CM | POA: Diagnosis not present

## 2020-12-17 ENCOUNTER — Telehealth: Payer: Self-pay

## 2020-12-17 DIAGNOSIS — Z515 Encounter for palliative care: Secondary | ICD-10-CM | POA: Diagnosis not present

## 2020-12-17 DIAGNOSIS — I10 Essential (primary) hypertension: Secondary | ICD-10-CM | POA: Diagnosis not present

## 2020-12-17 DIAGNOSIS — I509 Heart failure, unspecified: Secondary | ICD-10-CM | POA: Diagnosis not present

## 2020-12-17 DIAGNOSIS — E119 Type 2 diabetes mellitus without complications: Secondary | ICD-10-CM | POA: Diagnosis not present

## 2020-12-17 DIAGNOSIS — G4733 Obstructive sleep apnea (adult) (pediatric): Secondary | ICD-10-CM | POA: Diagnosis not present

## 2020-12-17 DIAGNOSIS — R109 Unspecified abdominal pain: Secondary | ICD-10-CM | POA: Diagnosis not present

## 2020-12-17 DIAGNOSIS — F32A Depression, unspecified: Secondary | ICD-10-CM | POA: Diagnosis not present

## 2020-12-17 NOTE — Telephone Encounter (Signed)
DECISION :    Approved today   Request Reference Number: [id number]. [place] KIT 2 SENSOR is approved through 06/16/2021.    For further questions, call [employer] at [phone].  Drug  FreeStyle Libre 2 Sensor  Form  [employer] Electronic Prior Authorization Form [phone] NCPDP)    ( COPY SENT TO PHARMACY ALSO )

## 2020-12-17 NOTE — Telephone Encounter (Signed)
PA  came through  for pt ( FREESTYLE  LIBRE 2 SENSOR )  was done and sent back with office notes from 10/26 ...  Awaiting approval or denial

## 2020-12-18 DIAGNOSIS — I10 Essential (primary) hypertension: Secondary | ICD-10-CM | POA: Diagnosis not present

## 2020-12-18 DIAGNOSIS — E119 Type 2 diabetes mellitus without complications: Secondary | ICD-10-CM | POA: Diagnosis not present

## 2020-12-18 DIAGNOSIS — Z515 Encounter for palliative care: Secondary | ICD-10-CM | POA: Diagnosis not present

## 2020-12-18 DIAGNOSIS — F32A Depression, unspecified: Secondary | ICD-10-CM | POA: Diagnosis not present

## 2020-12-18 DIAGNOSIS — R109 Unspecified abdominal pain: Secondary | ICD-10-CM | POA: Diagnosis not present

## 2020-12-18 DIAGNOSIS — I509 Heart failure, unspecified: Secondary | ICD-10-CM | POA: Diagnosis not present

## 2020-12-18 DIAGNOSIS — G4733 Obstructive sleep apnea (adult) (pediatric): Secondary | ICD-10-CM | POA: Diagnosis not present

## 2020-12-19 ENCOUNTER — Telehealth (HOSPITAL_COMMUNITY): Payer: Self-pay | Admitting: *Deleted

## 2020-12-19 DIAGNOSIS — I10 Essential (primary) hypertension: Secondary | ICD-10-CM | POA: Diagnosis not present

## 2020-12-19 DIAGNOSIS — R109 Unspecified abdominal pain: Secondary | ICD-10-CM | POA: Diagnosis not present

## 2020-12-19 DIAGNOSIS — I509 Heart failure, unspecified: Secondary | ICD-10-CM | POA: Diagnosis not present

## 2020-12-19 DIAGNOSIS — E119 Type 2 diabetes mellitus without complications: Secondary | ICD-10-CM | POA: Diagnosis not present

## 2020-12-19 DIAGNOSIS — Z515 Encounter for palliative care: Secondary | ICD-10-CM | POA: Diagnosis not present

## 2020-12-19 DIAGNOSIS — G4733 Obstructive sleep apnea (adult) (pediatric): Secondary | ICD-10-CM | POA: Diagnosis not present

## 2020-12-19 DIAGNOSIS — F32A Depression, unspecified: Secondary | ICD-10-CM | POA: Diagnosis not present

## 2020-12-19 NOTE — Telephone Encounter (Signed)
Pts wife called with [facility] to report pt c/o pain/tenderness at PICC site. There is no swelling/puffiness, no redness, no drainage, no odor. Pt denies chills,fever,nausea/vomiting. Per Mikki Santee have HHRN draw a CBC and call with update of site during next weeks dressing change.  Wife and HHRN aware and agreeable with plan. Order for CBC faxed to [facility] hospice care.

## 2020-12-20 DIAGNOSIS — R109 Unspecified abdominal pain: Secondary | ICD-10-CM | POA: Diagnosis not present

## 2020-12-20 DIAGNOSIS — I10 Essential (primary) hypertension: Secondary | ICD-10-CM | POA: Diagnosis not present

## 2020-12-20 DIAGNOSIS — F32A Depression, unspecified: Secondary | ICD-10-CM | POA: Diagnosis not present

## 2020-12-20 DIAGNOSIS — Z515 Encounter for palliative care: Secondary | ICD-10-CM | POA: Diagnosis not present

## 2020-12-20 DIAGNOSIS — E119 Type 2 diabetes mellitus without complications: Secondary | ICD-10-CM | POA: Diagnosis not present

## 2020-12-20 DIAGNOSIS — I509 Heart failure, unspecified: Secondary | ICD-10-CM | POA: Diagnosis not present

## 2020-12-20 DIAGNOSIS — G4733 Obstructive sleep apnea (adult) (pediatric): Secondary | ICD-10-CM | POA: Diagnosis not present

## 2020-12-21 DIAGNOSIS — R109 Unspecified abdominal pain: Secondary | ICD-10-CM | POA: Diagnosis not present

## 2020-12-21 DIAGNOSIS — G4733 Obstructive sleep apnea (adult) (pediatric): Secondary | ICD-10-CM | POA: Diagnosis not present

## 2020-12-21 DIAGNOSIS — E119 Type 2 diabetes mellitus without complications: Secondary | ICD-10-CM | POA: Diagnosis not present

## 2020-12-21 DIAGNOSIS — Z515 Encounter for palliative care: Secondary | ICD-10-CM | POA: Diagnosis not present

## 2020-12-21 DIAGNOSIS — F32A Depression, unspecified: Secondary | ICD-10-CM | POA: Diagnosis not present

## 2020-12-21 DIAGNOSIS — I509 Heart failure, unspecified: Secondary | ICD-10-CM | POA: Diagnosis not present

## 2020-12-21 DIAGNOSIS — I10 Essential (primary) hypertension: Secondary | ICD-10-CM | POA: Diagnosis not present

## 2020-12-22 DIAGNOSIS — F32A Depression, unspecified: Secondary | ICD-10-CM | POA: Diagnosis not present

## 2020-12-22 DIAGNOSIS — E119 Type 2 diabetes mellitus without complications: Secondary | ICD-10-CM | POA: Diagnosis not present

## 2020-12-22 DIAGNOSIS — I509 Heart failure, unspecified: Secondary | ICD-10-CM | POA: Diagnosis not present

## 2020-12-22 DIAGNOSIS — Z515 Encounter for palliative care: Secondary | ICD-10-CM | POA: Diagnosis not present

## 2020-12-22 DIAGNOSIS — E1169 Type 2 diabetes mellitus with other specified complication: Secondary | ICD-10-CM | POA: Diagnosis not present

## 2020-12-22 DIAGNOSIS — R109 Unspecified abdominal pain: Secondary | ICD-10-CM | POA: Diagnosis not present

## 2020-12-22 DIAGNOSIS — G4733 Obstructive sleep apnea (adult) (pediatric): Secondary | ICD-10-CM | POA: Diagnosis not present

## 2020-12-22 DIAGNOSIS — I10 Essential (primary) hypertension: Secondary | ICD-10-CM | POA: Diagnosis not present

## 2020-12-23 DIAGNOSIS — I10 Essential (primary) hypertension: Secondary | ICD-10-CM | POA: Diagnosis not present

## 2020-12-23 DIAGNOSIS — F32A Depression, unspecified: Secondary | ICD-10-CM | POA: Diagnosis not present

## 2020-12-23 DIAGNOSIS — Z515 Encounter for palliative care: Secondary | ICD-10-CM | POA: Diagnosis not present

## 2020-12-23 DIAGNOSIS — I509 Heart failure, unspecified: Secondary | ICD-10-CM | POA: Diagnosis not present

## 2020-12-23 DIAGNOSIS — E119 Type 2 diabetes mellitus without complications: Secondary | ICD-10-CM | POA: Diagnosis not present

## 2020-12-23 DIAGNOSIS — R109 Unspecified abdominal pain: Secondary | ICD-10-CM | POA: Diagnosis not present

## 2020-12-23 DIAGNOSIS — G4733 Obstructive sleep apnea (adult) (pediatric): Secondary | ICD-10-CM | POA: Diagnosis not present

## 2020-12-24 ENCOUNTER — Other Ambulatory Visit (HOSPITAL_COMMUNITY): Payer: Self-pay | Admitting: Family Medicine

## 2020-12-24 DIAGNOSIS — I10 Essential (primary) hypertension: Secondary | ICD-10-CM | POA: Diagnosis not present

## 2020-12-24 DIAGNOSIS — R109 Unspecified abdominal pain: Secondary | ICD-10-CM | POA: Diagnosis not present

## 2020-12-24 DIAGNOSIS — G4733 Obstructive sleep apnea (adult) (pediatric): Secondary | ICD-10-CM | POA: Diagnosis not present

## 2020-12-24 DIAGNOSIS — Z515 Encounter for palliative care: Secondary | ICD-10-CM | POA: Diagnosis not present

## 2020-12-24 DIAGNOSIS — E119 Type 2 diabetes mellitus without complications: Secondary | ICD-10-CM | POA: Diagnosis not present

## 2020-12-24 DIAGNOSIS — F32A Depression, unspecified: Secondary | ICD-10-CM | POA: Diagnosis not present

## 2020-12-24 DIAGNOSIS — I509 Heart failure, unspecified: Secondary | ICD-10-CM | POA: Diagnosis not present

## 2020-12-25 DIAGNOSIS — E119 Type 2 diabetes mellitus without complications: Secondary | ICD-10-CM | POA: Diagnosis not present

## 2020-12-25 DIAGNOSIS — Z515 Encounter for palliative care: Secondary | ICD-10-CM | POA: Diagnosis not present

## 2020-12-25 DIAGNOSIS — R109 Unspecified abdominal pain: Secondary | ICD-10-CM | POA: Diagnosis not present

## 2020-12-25 DIAGNOSIS — F32A Depression, unspecified: Secondary | ICD-10-CM | POA: Diagnosis not present

## 2020-12-25 DIAGNOSIS — G4733 Obstructive sleep apnea (adult) (pediatric): Secondary | ICD-10-CM | POA: Diagnosis not present

## 2020-12-25 DIAGNOSIS — I509 Heart failure, unspecified: Secondary | ICD-10-CM | POA: Diagnosis not present

## 2020-12-25 DIAGNOSIS — I10 Essential (primary) hypertension: Secondary | ICD-10-CM | POA: Diagnosis not present

## 2020-12-26 ENCOUNTER — Other Ambulatory Visit: Payer: Self-pay | Admitting: Internal Medicine

## 2020-12-26 DIAGNOSIS — E119 Type 2 diabetes mellitus without complications: Secondary | ICD-10-CM | POA: Diagnosis not present

## 2020-12-26 DIAGNOSIS — F32A Depression, unspecified: Secondary | ICD-10-CM | POA: Diagnosis not present

## 2020-12-26 DIAGNOSIS — G4733 Obstructive sleep apnea (adult) (pediatric): Secondary | ICD-10-CM | POA: Diagnosis not present

## 2020-12-26 DIAGNOSIS — Z515 Encounter for palliative care: Secondary | ICD-10-CM | POA: Diagnosis not present

## 2020-12-26 DIAGNOSIS — I10 Essential (primary) hypertension: Secondary | ICD-10-CM | POA: Diagnosis not present

## 2020-12-26 DIAGNOSIS — R109 Unspecified abdominal pain: Secondary | ICD-10-CM | POA: Diagnosis not present

## 2020-12-26 DIAGNOSIS — I509 Heart failure, unspecified: Secondary | ICD-10-CM | POA: Diagnosis not present

## 2020-12-27 DIAGNOSIS — Z515 Encounter for palliative care: Secondary | ICD-10-CM | POA: Diagnosis not present

## 2020-12-27 DIAGNOSIS — E119 Type 2 diabetes mellitus without complications: Secondary | ICD-10-CM | POA: Diagnosis not present

## 2020-12-27 DIAGNOSIS — R109 Unspecified abdominal pain: Secondary | ICD-10-CM | POA: Diagnosis not present

## 2020-12-27 DIAGNOSIS — F32A Depression, unspecified: Secondary | ICD-10-CM | POA: Diagnosis not present

## 2020-12-27 DIAGNOSIS — G4733 Obstructive sleep apnea (adult) (pediatric): Secondary | ICD-10-CM | POA: Diagnosis not present

## 2020-12-27 DIAGNOSIS — I509 Heart failure, unspecified: Secondary | ICD-10-CM | POA: Diagnosis not present

## 2020-12-27 DIAGNOSIS — I10 Essential (primary) hypertension: Secondary | ICD-10-CM | POA: Diagnosis not present

## 2020-12-28 DIAGNOSIS — F32A Depression, unspecified: Secondary | ICD-10-CM | POA: Diagnosis not present

## 2020-12-28 DIAGNOSIS — Z515 Encounter for palliative care: Secondary | ICD-10-CM | POA: Diagnosis not present

## 2020-12-28 DIAGNOSIS — R109 Unspecified abdominal pain: Secondary | ICD-10-CM | POA: Diagnosis not present

## 2020-12-28 DIAGNOSIS — E119 Type 2 diabetes mellitus without complications: Secondary | ICD-10-CM | POA: Diagnosis not present

## 2020-12-28 DIAGNOSIS — I509 Heart failure, unspecified: Secondary | ICD-10-CM | POA: Diagnosis not present

## 2020-12-28 DIAGNOSIS — I10 Essential (primary) hypertension: Secondary | ICD-10-CM | POA: Diagnosis not present

## 2020-12-28 DIAGNOSIS — G4733 Obstructive sleep apnea (adult) (pediatric): Secondary | ICD-10-CM | POA: Diagnosis not present

## 2020-12-29 DIAGNOSIS — I10 Essential (primary) hypertension: Secondary | ICD-10-CM | POA: Diagnosis not present

## 2020-12-29 DIAGNOSIS — I509 Heart failure, unspecified: Secondary | ICD-10-CM | POA: Diagnosis not present

## 2020-12-29 DIAGNOSIS — F32A Depression, unspecified: Secondary | ICD-10-CM | POA: Diagnosis not present

## 2020-12-29 DIAGNOSIS — G4733 Obstructive sleep apnea (adult) (pediatric): Secondary | ICD-10-CM | POA: Diagnosis not present

## 2020-12-29 DIAGNOSIS — Z515 Encounter for palliative care: Secondary | ICD-10-CM | POA: Diagnosis not present

## 2020-12-29 DIAGNOSIS — R109 Unspecified abdominal pain: Secondary | ICD-10-CM | POA: Diagnosis not present

## 2020-12-29 DIAGNOSIS — E119 Type 2 diabetes mellitus without complications: Secondary | ICD-10-CM | POA: Diagnosis not present

## 2020-12-30 DIAGNOSIS — I10 Essential (primary) hypertension: Secondary | ICD-10-CM | POA: Diagnosis not present

## 2020-12-30 DIAGNOSIS — E119 Type 2 diabetes mellitus without complications: Secondary | ICD-10-CM | POA: Diagnosis not present

## 2020-12-30 DIAGNOSIS — I509 Heart failure, unspecified: Secondary | ICD-10-CM | POA: Diagnosis not present

## 2020-12-30 DIAGNOSIS — Z515 Encounter for palliative care: Secondary | ICD-10-CM | POA: Diagnosis not present

## 2020-12-30 DIAGNOSIS — F32A Depression, unspecified: Secondary | ICD-10-CM | POA: Diagnosis not present

## 2020-12-30 DIAGNOSIS — G4733 Obstructive sleep apnea (adult) (pediatric): Secondary | ICD-10-CM | POA: Diagnosis not present

## 2020-12-30 DIAGNOSIS — R109 Unspecified abdominal pain: Secondary | ICD-10-CM | POA: Diagnosis not present

## 2020-12-31 DIAGNOSIS — I10 Essential (primary) hypertension: Secondary | ICD-10-CM | POA: Diagnosis not present

## 2020-12-31 DIAGNOSIS — R109 Unspecified abdominal pain: Secondary | ICD-10-CM | POA: Diagnosis not present

## 2020-12-31 DIAGNOSIS — Z515 Encounter for palliative care: Secondary | ICD-10-CM | POA: Diagnosis not present

## 2020-12-31 DIAGNOSIS — G4733 Obstructive sleep apnea (adult) (pediatric): Secondary | ICD-10-CM | POA: Diagnosis not present

## 2020-12-31 DIAGNOSIS — E119 Type 2 diabetes mellitus without complications: Secondary | ICD-10-CM | POA: Diagnosis not present

## 2020-12-31 DIAGNOSIS — I509 Heart failure, unspecified: Secondary | ICD-10-CM | POA: Diagnosis not present

## 2020-12-31 DIAGNOSIS — F32A Depression, unspecified: Secondary | ICD-10-CM | POA: Diagnosis not present

## 2021-01-01 ENCOUNTER — Other Ambulatory Visit (HOSPITAL_COMMUNITY): Payer: Self-pay | Admitting: Family Medicine

## 2021-01-01 DIAGNOSIS — F32A Depression, unspecified: Secondary | ICD-10-CM | POA: Diagnosis not present

## 2021-01-01 DIAGNOSIS — R109 Unspecified abdominal pain: Secondary | ICD-10-CM | POA: Diagnosis not present

## 2021-01-01 DIAGNOSIS — G4733 Obstructive sleep apnea (adult) (pediatric): Secondary | ICD-10-CM | POA: Diagnosis not present

## 2021-01-01 DIAGNOSIS — E119 Type 2 diabetes mellitus without complications: Secondary | ICD-10-CM | POA: Diagnosis not present

## 2021-01-01 DIAGNOSIS — Z515 Encounter for palliative care: Secondary | ICD-10-CM | POA: Diagnosis not present

## 2021-01-01 DIAGNOSIS — I10 Essential (primary) hypertension: Secondary | ICD-10-CM | POA: Diagnosis not present

## 2021-01-01 DIAGNOSIS — I509 Heart failure, unspecified: Secondary | ICD-10-CM | POA: Diagnosis not present

## 2021-01-02 DIAGNOSIS — F32A Depression, unspecified: Secondary | ICD-10-CM | POA: Diagnosis not present

## 2021-01-02 DIAGNOSIS — Z515 Encounter for palliative care: Secondary | ICD-10-CM | POA: Diagnosis not present

## 2021-01-02 DIAGNOSIS — I509 Heart failure, unspecified: Secondary | ICD-10-CM | POA: Diagnosis not present

## 2021-01-02 DIAGNOSIS — E119 Type 2 diabetes mellitus without complications: Secondary | ICD-10-CM | POA: Diagnosis not present

## 2021-01-02 DIAGNOSIS — R109 Unspecified abdominal pain: Secondary | ICD-10-CM | POA: Diagnosis not present

## 2021-01-02 DIAGNOSIS — I10 Essential (primary) hypertension: Secondary | ICD-10-CM | POA: Diagnosis not present

## 2021-01-02 DIAGNOSIS — G4733 Obstructive sleep apnea (adult) (pediatric): Secondary | ICD-10-CM | POA: Diagnosis not present

## 2021-01-03 DIAGNOSIS — G4733 Obstructive sleep apnea (adult) (pediatric): Secondary | ICD-10-CM | POA: Diagnosis not present

## 2021-01-03 DIAGNOSIS — I509 Heart failure, unspecified: Secondary | ICD-10-CM | POA: Diagnosis not present

## 2021-01-03 DIAGNOSIS — R109 Unspecified abdominal pain: Secondary | ICD-10-CM | POA: Diagnosis not present

## 2021-01-03 DIAGNOSIS — Z515 Encounter for palliative care: Secondary | ICD-10-CM | POA: Diagnosis not present

## 2021-01-03 DIAGNOSIS — E119 Type 2 diabetes mellitus without complications: Secondary | ICD-10-CM | POA: Diagnosis not present

## 2021-01-03 DIAGNOSIS — I10 Essential (primary) hypertension: Secondary | ICD-10-CM | POA: Diagnosis not present

## 2021-01-03 DIAGNOSIS — F32A Depression, unspecified: Secondary | ICD-10-CM | POA: Diagnosis not present

## 2021-01-04 DIAGNOSIS — G4733 Obstructive sleep apnea (adult) (pediatric): Secondary | ICD-10-CM | POA: Diagnosis not present

## 2021-01-04 DIAGNOSIS — I509 Heart failure, unspecified: Secondary | ICD-10-CM | POA: Diagnosis not present

## 2021-01-04 DIAGNOSIS — R109 Unspecified abdominal pain: Secondary | ICD-10-CM | POA: Diagnosis not present

## 2021-01-04 DIAGNOSIS — F32A Depression, unspecified: Secondary | ICD-10-CM | POA: Diagnosis not present

## 2021-01-04 DIAGNOSIS — I10 Essential (primary) hypertension: Secondary | ICD-10-CM | POA: Diagnosis not present

## 2021-01-04 DIAGNOSIS — Z515 Encounter for palliative care: Secondary | ICD-10-CM | POA: Diagnosis not present

## 2021-01-04 DIAGNOSIS — E119 Type 2 diabetes mellitus without complications: Secondary | ICD-10-CM | POA: Diagnosis not present

## 2021-01-05 ENCOUNTER — Other Ambulatory Visit: Payer: Self-pay | Admitting: Internal Medicine

## 2021-01-05 DIAGNOSIS — I509 Heart failure, unspecified: Secondary | ICD-10-CM | POA: Diagnosis not present

## 2021-01-05 DIAGNOSIS — E119 Type 2 diabetes mellitus without complications: Secondary | ICD-10-CM | POA: Diagnosis not present

## 2021-01-05 DIAGNOSIS — E1169 Type 2 diabetes mellitus with other specified complication: Secondary | ICD-10-CM

## 2021-01-05 DIAGNOSIS — G4733 Obstructive sleep apnea (adult) (pediatric): Secondary | ICD-10-CM | POA: Diagnosis not present

## 2021-01-05 DIAGNOSIS — I10 Essential (primary) hypertension: Secondary | ICD-10-CM | POA: Diagnosis not present

## 2021-01-05 DIAGNOSIS — R109 Unspecified abdominal pain: Secondary | ICD-10-CM | POA: Diagnosis not present

## 2021-01-05 DIAGNOSIS — F32A Depression, unspecified: Secondary | ICD-10-CM | POA: Diagnosis not present

## 2021-01-05 DIAGNOSIS — Z515 Encounter for palliative care: Secondary | ICD-10-CM | POA: Diagnosis not present

## 2021-01-05 NOTE — Progress Notes (Addendum)
PCP: [name], MD HF Cardiologist: Dr. Haroldine Laws   HPI: Jonathan [name] is a 35 y.o.with a history of chronic HFrEF, NICM, HTN, asthma, OSA, morbid obesity, and uncontrolled DM.    Admitted 10/2016 with progressive SOB x 2 weeks. He reports h/o systolic CHF with EF 85-88% by Echo in [place] in 2017. Cath around that time without any significant CAD per patient. He was diuresed with IV Lasix, discharge weight was 373 pounds. He had run out of most of his medications due to moving to [place] and not having insurance. Meds restarted at discharge: Coreg, Mearl Latin, Lasix.    He had repeat Echo 03/02/2017. EF had normalized to 55% but went back down when he was off HF meds. EF in 2021 back down to 30-35%. He has refused ICD.    Over the last year he has had ongoing difficulty with medication/dietary compliance resulting in volume overload. Managing diuretics in the community has been a challenge with frequent adjustments. At one time he was followed by HF paramedicine but was discharged due poor compliance.    He has been followed in the HF clinic and was last seen 05/2020.  He cancelled follow up appointments 08/2020 and 09/2020.    10/16/20 Hgb A1C > 14. Saw PCP last week and was volume overloaded and discussion about starting insulin. He was reluctant to start due to needle phobia.  Dr Gilford Rile noted he had not been able to tolerate his diuretics due to running out of his potassium supplement.  Weight at that time 395 pounds.   Admitted 10/28/20 with A/C HFrEF, A fib RVR , and marked volume overload. Hospital course complicated by low output so milrinone started. Diuresed with lasix drip + metolazone+ diamox.   Failed milrinone wean and was discharged on milrinone. He was not a candidate for advanced therapies due to noncompliance and uncontrolled diabetes. HGB A1C >14, Palliative consulted for goals of care. He elected DNR/DNI. Referred to [facility] for Hospice services.   Seen in ED 11/25/20 with home  milrinone pump issues. Switched to hospital pump and instructed to wait for [facility] RN to help troubleshoot. Left before Hospice nurse could fix pump.  He is here today for 4 week CHF f/u. Has been feeling well. No chest pain or dyspnea. Denies orthopnea or PND. Has intermittent leg edema that is present during day and resolves overnight. He has been wearing compression stockings. Weight in clinic stable at 340 lb. He is compliant with all medications.   Reports HHRN noticed he had no blood return on one of PICC line lumens.  Cardiac Studies   - Echo 9/22: EF<20%, severely decreased LV, grade II DD.  - Echo 10/21: EF 30-35% with moderate RV dysfunction in setting of recurrent RBBB with significant dyssynchrony.  - Echo 04/2019: EF 30-35%  - Echo 2019: EF 55%   - Echo EF 15-20% in [place] in 2017. Cath around that time without   ROS: All systems negative except as listed in HPI, PMH and Problem List.  SH:  Social History   Socioeconomic History   Marital status: Married    Spouse name: Not on file   Number of children: Not on file   Years of education: Not on file   Highest education level: Not on file  Occupational History   Not on file  Tobacco Use   Smoking status: Never   Smokeless tobacco: Never  Vaping Use   Vaping Use: Never used  Substance and Sexual Activity  Alcohol use: No   Drug use: No   Sexual activity: Never  Other Topics Concern   Not on file  Social History Narrative   Not on file   Social Determinants of Health   Financial Resource Strain: Low Risk    Difficulty of Paying Living Expenses: Not very hard  Food Insecurity: No Food Insecurity   Worried About Running Out of Food in the Last Year: Never true   Ran Out of Food in the Last Year: Never true  Transportation Needs: No Transportation Needs   Lack of Transportation (Medical): No   Lack of Transportation (Non-Medical): No  Physical Activity: Not on file  Stress: Not on file  Social Connections: Not on  file  Intimate Partner Violence: Not on file   FH:  Family History  Problem Relation Age of Onset   Diabetes Mellitus II Mother    Heart failure Mother    Hypertension Mother    Heart disease Mother    Heart failure Father    Kidney disease Father    Heart disease Father    Congestive Heart Failure Neg Hx    Past Medical History:  Diagnosis Date   Acute pain of left foot 05/03/2017   Asthma    Asthma, chronic, mild persistent, uncomplicated 70/62/3762   Charcot ankle, left 05/31/2017   CHF (congestive heart failure) (HCC)    Chronic systolic (congestive) heart failure ([place])    Diabetes mellitus type 2 in obese ([place]) 04/21/2014   Diabetic polyneuropathy associated with type 2 diabetes mellitus ([place]) 03/14/2017   Essential hypertension 10/26/2016   Hypertension    Obesity    Obesity, Class III, BMI 40-49.9 (morbid obesity) ([place]) 10/26/2016   OSA (obstructive sleep apnea) 12/02/2016   Type 2 diabetes mellitus with diabetic neuropathy (HCC)    Current Outpatient Medications  Medication Sig Dispense Refill   Accu-Chek Softclix Lancets lancets Check blood sugar 3x a day 100 each 11   albuterol (VENTOLIN HFA) 108 (90 Base) MCG/ACT inhaler Inhale 2 puffs into the lungs every 4 (four) hours as needed for wheezing or shortness of breath. 18 g 1   amiodarone (PACERONE) 200 MG tablet Take 0.5 tablets (100 mg total) by mouth daily. 15 tablet 2   amiodarone (PACERONE) 200 MG tablet Patient takes 1/2 tablet daily.     apixaban (ELIQUIS) 5 MG TABS tablet Take 1 tablet (5 mg total) by mouth 2 (two) times daily. 60 tablet 2   azelastine (ASTELIN) 0.1 % nasal spray Place 2 sprays into both nostrils 2 (two) times daily. Use in each nostril as directed (Patient taking differently: Place 2 sprays into both nostrils 2 (two) times daily. As needed) 30 mL 5   blood glucose meter kit and supplies KIT Dispense based on patient and insurance preference. Use up to four times daily as directed. (FOR ICD-9  250.00, 250.01). For QAC - HS accuchecks. May switch to any brand. (Patient taking differently: Inject 1 each into the skin See admin instructions. Dispense based on patient and insurance preference. Use up to four times daily as directed. (FOR ICD-9 250.00, 250.01). For QAC - HS accuchecks. May switch to any brand.) 1 each 1   Blood Glucose Monitoring Suppl (ACCU-CHEK GUIDE) w/Device KIT Check blood sugar 3 times a day (Patient taking differently: 1 each by Other route See admin instructions. Check blood sugar 3 times a day) 1 kit 1   Continuous Blood Gluc Sensor (FREESTYLE LIBRE 2 SENSOR) MISC Use to check blood sugar  at least 6 times a day 2 each 11   digoxin (LANOXIN) 0.125 MG tablet TAKE 1 TABLET BY MOUTH EVERY DAY 90 tablet 4   fluticasone (FLONASE) 50 MCG/ACT nasal spray Apply 2 sprays each nostril daily for 1-2 weeks 16 g 3   gabapentin (NEURONTIN) 300 MG capsule Take 1 capsule (300 mg total) by mouth 3 (three) times daily. 90 capsule 2   glucose blood (ACCU-CHEK GUIDE) test strip Check blood sugar 3 times per day 100 each 11   insulin aspart (NOVOLOG) 100 UNIT/ML injection Inject 20 Units into the skin 3 (three) times daily with meals. 20 mL 11   insulin glargine (LANTUS) 100 UNIT/ML injection Inject 0.3 mLs (30 Units total) into the skin 2 (two) times daily. 20 mL 11   Insulin Syringe-Needle U-100 30G X 1/2" 1 ML MISC Use to inject insulin 4 times a day. 100 each 2   isosorbide-hydrALAZINE (BIDIL) 20-37.5 MG tablet Take 1 tablet by mouth 3 (three) times daily. 90 tablet 2   metolazone (ZAROXOLYN) 2.5 MG tablet Take 1 tablet (2.5 mg total) by mouth 2 (two) times a week. Take 2 times a week on Mondays and Fridays starting 11/24/2020 8 tablet 2   milrinone (PRIMACOR) 20 MG/100 ML SOLN infusion Inject 0.0622 mg/min into the vein continuous.     nystatin powder Apply 1 application topically 3 (three) times daily. (Patient taking differently: Apply 1 application topically 3 (three) times daily as  needed (rash).) 15 g 0   spironolactone (ALDACTONE) 25 MG tablet Take 0.5 tablets (12.5 mg total) by mouth daily. 45 tablet 3   torsemide (DEMADEX) 20 MG tablet TAKE 4 TABLETS TWICE DAILY 782 tablet 3   TRULICITY 1.5 NF/6.2ZH SOPN INJECT 1.5 MG INTO THE SKIN ONCE A WEEK. 2 mL 5   potassium chloride SA (KLOR-CON) 20 MEQ tablet Take 4 tablets (80 mEq total) by mouth 2 (two) times daily. 180 tablet 2   sacubitril-valsartan (ENTRESTO) 24-26 MG Take 1 tablet by mouth 2 (two) times daily. 60 tablet 3   Current Facility-Administered Medications  Medication Dose Route Frequency Provider Last Rate Last Admin   alteplase (CATHFLO ACTIVASE) injection 2 mg  2 mg Intracatheter Once Bensimhon, Shaune Pascal, MD       Facility-Administered Medications Ordered in Other Encounters  Medication Dose Route Frequency Provider Last Rate Last Admin   alteplase (CATHFLO ACTIVASE) injection 2 mg  2 mg Intracatheter Once Bensimhon, Shaune Pascal, MD       BP (!) 142/96   Pulse (!) 112   Wt (!) 154.7 kg   SpO2 98%   BMI 43.79 kg/m   Wt Readings from Last 3 Encounters:  01/07/21 (!) 154.7 kg  12/09/20 (!) 154.8 kg  11/25/20 (!) 154.2 kg   PHYSICAL EXAM: General:  Well appearing. Walked into clinic. Wife present. HEENT: normal Neck: supple. no JVD. Carotids 2+ bilat; no bruits. No lymphadenopathy or thryomegaly appreciated. Cor: PMI nondisplaced. Regular rhythm, tachycardic. No rubs, gallops or murmurs. Lungs: clear Abdomen: soft, nontender, nondistended. No hepatosplenomegaly.  Extremities: no cyanosis, clubbing, rash, trace edema, RUE PICC line. No erythema, induration or drainage.  Neuro: alert & orientedx3, cranial nerves grossly intact. moves all 4 extremities w/o difficulty. Affect pleasant   ECG: Sinus tach 111 bpm, RBBB  ASSESSMENT & PLAN: End Stage HFrEF , NICM  - Echo back in 2021 EF 30-35 % with moderate RV dysfunction in the setting of recurrent RBBB with significant dyssynchrony.  - Echo 10/2020 EF <  20% and moderate RV dysfunction - Failed milrinone wean. Discharged on milrinone 0.375 mg . - NYHA IIIa currently on milrinone 0.375 mcg. Volume status stable today. Continue Torsemide 80 mg bid +  metolazone 2.5 on Mondays and Fridays. - Continue spironolacone 12.5 mg daily. - Continue digoxin 0.125 mg daily.  - Intolerant SGLT2i due to candidiasis +uncontrolled DM - Continue Bidil 1 tablet tid. - Off ARNI due to AKI. Will add entresto if renal function improved on today's labs - No beta blocker d/t low output - BMET today.  - Continue home milirinone. He is aware we are not sure how long inotrope therapy will provide benefit.  [facility] noting difficulty drawing blood back on one of PICC line lumens. He was sent down to infusion room for evaluation by IV team after today's appointment to see if needs TPA. Should not have routine labs drawn off PICC line. - Not candidate for VAD or transplant currently due to size, medical compliance and Hgba1c > 14. Had outside labs on 11/04 with improvement in Scr from 2.1 to 1.28. This is very reassuring. He has been compliant with medical therapy since his last HF admission and working on controlling his blood sugars. Has repeat A1c with PCP in January. He is hoping to be a candidate for advanced therapies in the future. - Currently a DNR. Patient and his wife want his code status changed to Full Code. System updated.    2. Uncontrolled DM - 9/1 Hgb A1C > 14.  - Per PCP. - No [id number].  - On trulicity. Encouraged weight loss.  3. CKD  -Previously stage IIIb, with Scr around 2 -Marked improvement recently. -Scr 1.23 today   4. Atrial Flutter  - EKG today --> Sinus Tach.  - Continue amiodarone 100 mg daily - Continue Eliquis 5 mg bid. No bleeding issues  5. OSA: Severe OSA AHI 65 - Does not wear CPAP.  6. Obesity - Body mass index is 43.79 kg/m. - Discussed portion control. He is working on this.  7.  GOC - Followed by [facility] for Hospice  Services.  - Code status changed from DNR to Full Code today as above. - Discussed further the role of milrinone. Patient and his wife asking about " next steps". See discussion above.   8.  H/O medical noncompliance - Significantly improved   Follow up in 4 with APP and as scheduled 02/2021 with Dr Haroldine Laws   Lorijean Husser N PA-C 3:12 PM

## 2021-01-06 DIAGNOSIS — R109 Unspecified abdominal pain: Secondary | ICD-10-CM | POA: Diagnosis not present

## 2021-01-06 DIAGNOSIS — Z515 Encounter for palliative care: Secondary | ICD-10-CM | POA: Diagnosis not present

## 2021-01-06 DIAGNOSIS — F32A Depression, unspecified: Secondary | ICD-10-CM | POA: Diagnosis not present

## 2021-01-06 DIAGNOSIS — I10 Essential (primary) hypertension: Secondary | ICD-10-CM | POA: Diagnosis not present

## 2021-01-06 DIAGNOSIS — E119 Type 2 diabetes mellitus without complications: Secondary | ICD-10-CM | POA: Diagnosis not present

## 2021-01-06 DIAGNOSIS — G4733 Obstructive sleep apnea (adult) (pediatric): Secondary | ICD-10-CM | POA: Diagnosis not present

## 2021-01-06 DIAGNOSIS — I509 Heart failure, unspecified: Secondary | ICD-10-CM | POA: Diagnosis not present

## 2021-01-07 ENCOUNTER — Telehealth (HOSPITAL_COMMUNITY): Payer: Self-pay | Admitting: Surgery

## 2021-01-07 ENCOUNTER — Encounter (HOSPITAL_COMMUNITY)
Admission: RE | Admit: 2021-01-07 | Discharge: 2021-01-07 | Disposition: A | Discharge status: Home / Self Care | Payer: Medicaid Other | Source: Ambulatory Visit | Attending: Internal Medicine | Admitting: Internal Medicine

## 2021-01-07 ENCOUNTER — Encounter (HOSPITAL_COMMUNITY): Payer: Self-pay

## 2021-01-07 ENCOUNTER — Ambulatory Visit (HOSPITAL_COMMUNITY)
Admission: RE | Admit: 2021-01-07 | Discharge: 2021-01-07 | Disposition: A | Discharge status: Home / Self Care | Payer: Medicaid Other | Source: Ambulatory Visit | Attending: Internal Medicine | Admitting: Internal Medicine

## 2021-01-07 VITALS — BP 142/96 | HR 112 | Wt 341.1 lb

## 2021-01-07 DIAGNOSIS — I5084 End stage heart failure: Secondary | ICD-10-CM | POA: Diagnosis not present

## 2021-01-07 DIAGNOSIS — I130 Hypertensive heart and chronic kidney disease with heart failure and stage 1 through stage 4 chronic kidney disease, or unspecified chronic kidney disease: Secondary | ICD-10-CM | POA: Diagnosis not present

## 2021-01-07 DIAGNOSIS — F32A Depression, unspecified: Secondary | ICD-10-CM | POA: Diagnosis not present

## 2021-01-07 DIAGNOSIS — E1165 Type 2 diabetes mellitus with hyperglycemia: Secondary | ICD-10-CM | POA: Diagnosis not present

## 2021-01-07 DIAGNOSIS — Z7901 Long term (current) use of anticoagulants: Secondary | ICD-10-CM | POA: Diagnosis not present

## 2021-01-07 DIAGNOSIS — Z6841 Body Mass Index (BMI) 40.0 and over, adult: Secondary | ICD-10-CM | POA: Insufficient documentation

## 2021-01-07 DIAGNOSIS — I5022 Chronic systolic (congestive) heart failure: Secondary | ICD-10-CM

## 2021-01-07 DIAGNOSIS — I4892 Unspecified atrial flutter: Secondary | ICD-10-CM

## 2021-01-07 DIAGNOSIS — I428 Other cardiomyopathies: Secondary | ICD-10-CM | POA: Diagnosis not present

## 2021-01-07 DIAGNOSIS — E669 Obesity, unspecified: Secondary | ICD-10-CM | POA: Insufficient documentation

## 2021-01-07 DIAGNOSIS — N1832 Chronic kidney disease, stage 3b: Secondary | ICD-10-CM | POA: Diagnosis not present

## 2021-01-07 DIAGNOSIS — R109 Unspecified abdominal pain: Secondary | ICD-10-CM | POA: Diagnosis not present

## 2021-01-07 DIAGNOSIS — I10 Essential (primary) hypertension: Secondary | ICD-10-CM | POA: Diagnosis not present

## 2021-01-07 DIAGNOSIS — Z79899 Other long term (current) drug therapy: Secondary | ICD-10-CM | POA: Diagnosis not present

## 2021-01-07 DIAGNOSIS — E1122 Type 2 diabetes mellitus with diabetic chronic kidney disease: Secondary | ICD-10-CM | POA: Diagnosis not present

## 2021-01-07 DIAGNOSIS — Z515 Encounter for palliative care: Secondary | ICD-10-CM | POA: Diagnosis not present

## 2021-01-07 DIAGNOSIS — E119 Type 2 diabetes mellitus without complications: Secondary | ICD-10-CM | POA: Diagnosis not present

## 2021-01-07 DIAGNOSIS — J45909 Unspecified asthma, uncomplicated: Secondary | ICD-10-CM | POA: Insufficient documentation

## 2021-01-07 DIAGNOSIS — G4733 Obstructive sleep apnea (adult) (pediatric): Secondary | ICD-10-CM | POA: Diagnosis not present

## 2021-01-07 DIAGNOSIS — I509 Heart failure, unspecified: Secondary | ICD-10-CM | POA: Diagnosis not present

## 2021-01-07 LAB — BASIC METABOLIC PANEL
Anion gap: 16 — ABNORMAL HIGH (ref 5–15)
BUN: 28 mg/dL — ABNORMAL HIGH (ref 6–20)
CO2: 25 mmol/L (ref 22–32)
Calcium: 9.4 mg/dL (ref 8.9–10.3)
Chloride: 90 mmol/L — ABNORMAL LOW (ref 98–111)
Creatinine, Ser: 1.23 mg/dL (ref 0.61–1.24)
GFR, Estimated: >60 mL/min (ref >60)
Glucose, Bld: 265 mg/dL — ABNORMAL HIGH (ref 70–99)
Potassium: 3.4 mmol/L — ABNORMAL LOW (ref 3.5–5.1)
Sodium: 131 mmol/L — ABNORMAL LOW (ref 135–145)

## 2021-01-07 MED ORDER — ALTEPLASE 2 MG IJ SOLR: 2.0000 mg | Freq: Once | INTRAMUSCULAR | Status: DC

## 2021-01-07 MED ORDER — SACUBITRIL-VALSARTAN 24-26 MG PO TABS: 1.0000 | ORAL_TABLET | Freq: Two times a day (BID) | ORAL | 3 refills | Status: DC

## 2021-01-07 MED ORDER — ALTEPLASE 2 MG IJ SOLR
INTRAMUSCULAR | Status: AC
  Filled 2021-01-07: qty 2

## 2021-01-07 MED ORDER — POTASSIUM CHLORIDE CRYS ER 20 MEQ PO TBCR: 80.0000 meq | EXTENDED_RELEASE_TABLET | Freq: Two times a day (BID) | ORAL | 2 refills | Status: DC

## 2021-01-07 NOTE — Telephone Encounter (Signed)
Patient called and made aware of lab results as well as recommendations per provider.  I have scheduled a recheck lab appt and updated medication list.

## 2021-01-07 NOTE — Telephone Encounter (Signed)
-----   Message from Andrey Farmer, [place] sent at 01/07/2021  2:58 PM EST ----- See prior note. Would also like him to restart entresto at 24/26 mg BID. BMET as planned Monday.

## 2021-01-07 NOTE — Progress Notes (Signed)
Spoke with Jonathan [name] at Heart Failure clinic to let her know that IV team stated catheter was working, has return blood- no TPA needed.

## 2021-01-07 NOTE — Patient Instructions (Addendum)
  Referral to Infusion Clinic for PICC line port issues. Labwork today --we will only call with abnormal values. Please keep appt with Dr.Bensimhon as previously scheduled

## 2021-01-08 DIAGNOSIS — I10 Essential (primary) hypertension: Secondary | ICD-10-CM | POA: Diagnosis not present

## 2021-01-08 DIAGNOSIS — Z515 Encounter for palliative care: Secondary | ICD-10-CM | POA: Diagnosis not present

## 2021-01-08 DIAGNOSIS — R109 Unspecified abdominal pain: Secondary | ICD-10-CM | POA: Diagnosis not present

## 2021-01-08 DIAGNOSIS — G4733 Obstructive sleep apnea (adult) (pediatric): Secondary | ICD-10-CM | POA: Diagnosis not present

## 2021-01-08 DIAGNOSIS — F32A Depression, unspecified: Secondary | ICD-10-CM | POA: Diagnosis not present

## 2021-01-08 DIAGNOSIS — E119 Type 2 diabetes mellitus without complications: Secondary | ICD-10-CM | POA: Diagnosis not present

## 2021-01-08 DIAGNOSIS — I509 Heart failure, unspecified: Secondary | ICD-10-CM | POA: Diagnosis not present

## 2021-01-09 DIAGNOSIS — I10 Essential (primary) hypertension: Secondary | ICD-10-CM | POA: Diagnosis not present

## 2021-01-09 DIAGNOSIS — R109 Unspecified abdominal pain: Secondary | ICD-10-CM | POA: Diagnosis not present

## 2021-01-09 DIAGNOSIS — Z515 Encounter for palliative care: Secondary | ICD-10-CM | POA: Diagnosis not present

## 2021-01-09 DIAGNOSIS — F32A Depression, unspecified: Secondary | ICD-10-CM | POA: Diagnosis not present

## 2021-01-09 DIAGNOSIS — E119 Type 2 diabetes mellitus without complications: Secondary | ICD-10-CM | POA: Diagnosis not present

## 2021-01-09 DIAGNOSIS — G4733 Obstructive sleep apnea (adult) (pediatric): Secondary | ICD-10-CM | POA: Diagnosis not present

## 2021-01-09 DIAGNOSIS — I509 Heart failure, unspecified: Secondary | ICD-10-CM | POA: Diagnosis not present

## 2021-01-10 DIAGNOSIS — I10 Essential (primary) hypertension: Secondary | ICD-10-CM | POA: Diagnosis not present

## 2021-01-10 DIAGNOSIS — E119 Type 2 diabetes mellitus without complications: Secondary | ICD-10-CM | POA: Diagnosis not present

## 2021-01-10 DIAGNOSIS — R109 Unspecified abdominal pain: Secondary | ICD-10-CM | POA: Diagnosis not present

## 2021-01-10 DIAGNOSIS — F32A Depression, unspecified: Secondary | ICD-10-CM | POA: Diagnosis not present

## 2021-01-10 DIAGNOSIS — I509 Heart failure, unspecified: Secondary | ICD-10-CM | POA: Diagnosis not present

## 2021-01-10 DIAGNOSIS — Z515 Encounter for palliative care: Secondary | ICD-10-CM | POA: Diagnosis not present

## 2021-01-10 DIAGNOSIS — G4733 Obstructive sleep apnea (adult) (pediatric): Secondary | ICD-10-CM | POA: Diagnosis not present

## 2021-01-11 DIAGNOSIS — I10 Essential (primary) hypertension: Secondary | ICD-10-CM | POA: Diagnosis not present

## 2021-01-11 DIAGNOSIS — R109 Unspecified abdominal pain: Secondary | ICD-10-CM | POA: Diagnosis not present

## 2021-01-11 DIAGNOSIS — F32A Depression, unspecified: Secondary | ICD-10-CM | POA: Diagnosis not present

## 2021-01-11 DIAGNOSIS — Z515 Encounter for palliative care: Secondary | ICD-10-CM | POA: Diagnosis not present

## 2021-01-11 DIAGNOSIS — I509 Heart failure, unspecified: Secondary | ICD-10-CM | POA: Diagnosis not present

## 2021-01-11 DIAGNOSIS — G4733 Obstructive sleep apnea (adult) (pediatric): Secondary | ICD-10-CM | POA: Diagnosis not present

## 2021-01-11 DIAGNOSIS — E119 Type 2 diabetes mellitus without complications: Secondary | ICD-10-CM | POA: Diagnosis not present

## 2021-01-12 ENCOUNTER — Other Ambulatory Visit (HOSPITAL_COMMUNITY): Payer: Self-pay

## 2021-01-12 ENCOUNTER — Other Ambulatory Visit (HOSPITAL_COMMUNITY): Payer: Medicaid Other

## 2021-01-12 DIAGNOSIS — F32A Depression, unspecified: Secondary | ICD-10-CM | POA: Diagnosis not present

## 2021-01-12 DIAGNOSIS — R109 Unspecified abdominal pain: Secondary | ICD-10-CM | POA: Diagnosis not present

## 2021-01-12 DIAGNOSIS — Z515 Encounter for palliative care: Secondary | ICD-10-CM | POA: Diagnosis not present

## 2021-01-12 DIAGNOSIS — E119 Type 2 diabetes mellitus without complications: Secondary | ICD-10-CM | POA: Diagnosis not present

## 2021-01-12 DIAGNOSIS — I10 Essential (primary) hypertension: Secondary | ICD-10-CM | POA: Diagnosis not present

## 2021-01-12 DIAGNOSIS — I509 Heart failure, unspecified: Secondary | ICD-10-CM | POA: Diagnosis not present

## 2021-01-12 DIAGNOSIS — G4733 Obstructive sleep apnea (adult) (pediatric): Secondary | ICD-10-CM | POA: Diagnosis not present

## 2021-01-12 MED ORDER — DIGOXIN 125 MCG PO TABS: 125.0000 ug | ORAL_TABLET | Freq: Every day | ORAL | 3 refills | Status: DC

## 2021-01-13 ENCOUNTER — Other Ambulatory Visit: Payer: Self-pay | Admitting: Internal Medicine

## 2021-01-13 DIAGNOSIS — E119 Type 2 diabetes mellitus without complications: Secondary | ICD-10-CM | POA: Diagnosis not present

## 2021-01-13 DIAGNOSIS — R109 Unspecified abdominal pain: Secondary | ICD-10-CM | POA: Diagnosis not present

## 2021-01-13 DIAGNOSIS — F32A Depression, unspecified: Secondary | ICD-10-CM | POA: Diagnosis not present

## 2021-01-13 DIAGNOSIS — I10 Essential (primary) hypertension: Secondary | ICD-10-CM | POA: Diagnosis not present

## 2021-01-13 DIAGNOSIS — G4733 Obstructive sleep apnea (adult) (pediatric): Secondary | ICD-10-CM | POA: Diagnosis not present

## 2021-01-13 DIAGNOSIS — Z515 Encounter for palliative care: Secondary | ICD-10-CM | POA: Diagnosis not present

## 2021-01-13 DIAGNOSIS — I509 Heart failure, unspecified: Secondary | ICD-10-CM | POA: Diagnosis not present

## 2021-01-14 ENCOUNTER — Encounter: Payer: Self-pay | Admitting: Allergy

## 2021-01-14 ENCOUNTER — Ambulatory Visit (INDEPENDENT_AMBULATORY_CARE_PROVIDER_SITE_OTHER): Payer: Medicaid Other | Admitting: Allergy

## 2021-01-14 ENCOUNTER — Other Ambulatory Visit: Payer: Self-pay

## 2021-01-14 VITALS — BP 130/88 | HR 111 | Temp 97.2°F | Resp 16 | Ht 74.0 in | Wt 341.1 lb

## 2021-01-14 DIAGNOSIS — G4733 Obstructive sleep apnea (adult) (pediatric): Secondary | ICD-10-CM | POA: Diagnosis not present

## 2021-01-14 DIAGNOSIS — H1013 Acute atopic conjunctivitis, bilateral: Secondary | ICD-10-CM

## 2021-01-14 DIAGNOSIS — F32A Depression, unspecified: Secondary | ICD-10-CM | POA: Diagnosis not present

## 2021-01-14 DIAGNOSIS — J4520 Mild intermittent asthma, uncomplicated: Secondary | ICD-10-CM | POA: Diagnosis not present

## 2021-01-14 DIAGNOSIS — I509 Heart failure, unspecified: Secondary | ICD-10-CM | POA: Diagnosis not present

## 2021-01-14 DIAGNOSIS — R109 Unspecified abdominal pain: Secondary | ICD-10-CM | POA: Diagnosis not present

## 2021-01-14 DIAGNOSIS — E119 Type 2 diabetes mellitus without complications: Secondary | ICD-10-CM | POA: Diagnosis not present

## 2021-01-14 DIAGNOSIS — J3089 Other allergic rhinitis: Secondary | ICD-10-CM

## 2021-01-14 DIAGNOSIS — L2089 Other atopic dermatitis: Secondary | ICD-10-CM

## 2021-01-14 DIAGNOSIS — T781XXD Other adverse food reactions, not elsewhere classified, subsequent encounter: Secondary | ICD-10-CM

## 2021-01-14 DIAGNOSIS — Z515 Encounter for palliative care: Secondary | ICD-10-CM | POA: Diagnosis not present

## 2021-01-14 DIAGNOSIS — I10 Essential (primary) hypertension: Secondary | ICD-10-CM | POA: Diagnosis not present

## 2021-01-14 MED ORDER — LORATADINE 10 MG PO TABS: 10.0000 mg | ORAL_TABLET | Freq: Two times a day (BID) | ORAL | 5 refills | Status: AC | PRN

## 2021-01-14 NOTE — Progress Notes (Signed)
Follow-up Note  RE: Jonathan [name] MRN: [id number] DOB: 02-21-1985 Date of Office Visit: 01/14/2021   History of present illness: Jonathan [name] is a 35 y.o. male presenting today for follow-up of allergic rhinitis with conjunctivitis, asthma, eczema and adverse food reaction.  He was last seen in the office on 09/05/2020 by myself.  He presents today with his wife.  After having his allergy testing done at the last visit showing a mold allergy he states he did move from the home he was living in that did have mold.  His current home does not have any mold issues and he can tell a big difference.  He states he is no longer having any of the allergy symptoms he was having before which did include watery eyes, itchy throat, nasal congestion and drainage, headaches, itching.  Because he has not been having the symptoms he has not needed to use his allergy medications.  He states he was actually advised by his cardiologist to discontinue his allergy regimen as he is on a milrinone drip.  He does state that he has been okay to use Claritin and/or regular Benadryl.  He also can use nasal saline. He has an albuterol inhaler that he does not need to use. She does continue to avoid mushroom and coconut in the diet.  Review of systems: Review of Systems  Constitutional: Negative.   HENT: Negative.    Eyes: Negative.   Respiratory: Negative.    Gastrointestinal: Negative.   Musculoskeletal: Negative.   Skin: Negative.   Neurological: Negative.    All other systems negative unless noted above in HPI  Past medical/social/surgical/family history have been reviewed and are unchanged unless specifically indicated below.  No changes  Medication List: Current Outpatient Medications  Medication Sig Dispense Refill   Accu-Chek Softclix Lancets lancets Check blood sugar 3x a day 100 each 11   albuterol (VENTOLIN HFA) 108 (90 Base) MCG/ACT inhaler Inhale 2 puffs into the lungs every 4 (four) hours as  needed for wheezing or shortness of breath. 18 g 1   amiodarone (PACERONE) 200 MG tablet Take 0.5 tablets (100 mg total) by mouth daily. 15 tablet 2   amiodarone (PACERONE) 200 MG tablet Patient takes 1/2 tablet daily.     apixaban (ELIQUIS) 5 MG TABS tablet Take 1 tablet (5 mg total) by mouth 2 (two) times daily. 60 tablet 2   blood glucose meter kit and supplies KIT Dispense based on patient and insurance preference. Use up to four times daily as directed. (FOR ICD-9 250.00, 250.01). For QAC - HS accuchecks. May switch to any brand. (Patient taking differently: Inject 1 each into the skin See admin instructions. Dispense based on patient and insurance preference. Use up to four times daily as directed. (FOR ICD-9 250.00, 250.01). For QAC - HS accuchecks. May switch to any brand.) 1 each 1   Blood Glucose Monitoring Suppl (ACCU-CHEK GUIDE) w/Device KIT Check blood sugar 3 times a day (Patient taking differently: 1 each by Other route See admin instructions. Check blood sugar 3 times a day) 1 kit 1   Continuous Blood Gluc Sensor (FREESTYLE LIBRE 2 SENSOR) MISC Use to check blood sugar at least 6 times a day 2 each 11   digoxin (LANOXIN) 0.125 MG tablet Take 1 tablet (125 mcg total) by mouth daily. 90 tablet 3   gabapentin (NEURONTIN) 300 MG capsule Take 1 capsule (300 mg total) by mouth 3 (three) times daily. 90 capsule 2   glucose blood (  ACCU-CHEK GUIDE) test strip Check blood sugar 3 times per day 100 each 11   insulin aspart (NOVOLOG) 100 UNIT/ML injection Inject 20 Units into the skin 3 (three) times daily with meals. 20 mL 11   insulin glargine (LANTUS) 100 UNIT/ML injection Inject 0.3 mLs (30 Units total) into the skin 2 (two) times daily. 20 mL 11   Insulin Syringe-Needle U-100 30G X 1/2" 1 ML MISC Use to inject insulin 4 times a day. 100 each 2   isosorbide-hydrALAZINE (BIDIL) 20-37.5 MG tablet Take 1 tablet by mouth 3 (three) times daily. 90 tablet 2   loratadine (CLARITIN) 10 MG tablet Take 1  tablet (10 mg total) by mouth 2 (two) times daily as needed for allergies (Can use an extra dose during flare ups.). 60 tablet 5   metolazone (ZAROXOLYN) 2.5 MG tablet Take 1 tablet (2.5 mg total) by mouth 2 (two) times a week. Take 2 times a week on Mondays and Fridays starting 11/24/2020 8 tablet 2   milrinone (PRIMACOR) 20 MG/100 ML SOLN infusion Inject 0.0622 mg/min into the vein continuous.     nystatin powder Apply 1 application topically 3 (three) times daily. (Patient taking differently: Apply 1 application topically 3 (three) times daily as needed (rash).) 15 g 0   potassium chloride SA (KLOR-CON) 20 MEQ tablet Take 4 tablets (80 mEq total) by mouth 2 (two) times daily. 180 tablet 2   sacubitril-valsartan (ENTRESTO) 24-26 MG Take 1 tablet by mouth 2 (two) times daily. 60 tablet 3   spironolactone (ALDACTONE) 25 MG tablet Take 0.5 tablets (12.5 mg total) by mouth daily. 45 tablet 3   torsemide (DEMADEX) 20 MG tablet TAKE 4 TABLETS TWICE DAILY 443 tablet 3   TRULICITY 1.5 XV/4.0GQ SOPN INJECT 1.5 MG INTO THE SKIN ONCE A WEEK. 2 mL 5   azelastine (ASTELIN) 0.1 % nasal spray Place 2 sprays into both nostrils 2 (two) times daily. Use in each nostril as directed (Patient not taking: Reported on 01/14/2021) 30 mL 5   fluticasone (FLONASE) 50 MCG/ACT nasal spray Apply 2 sprays each nostril daily for 1-2 weeks (Patient not taking: Reported on 01/14/2021) 16 g 3   No current facility-administered medications for this visit.     Known medication allergies: Allergies  Allergen Reactions   Hydrocodone     Hives      Physical examination: Blood pressure 130/88, pulse (!) 111, temperature (!) 97.2 F (36.2 C), resp. rate 16, height 6' 2"  (1.88 m), weight (!) 341 lb 0.8 oz (154.7 kg), SpO2 97 %.  General: Alert, interactive, in no acute distress. HEENT: PERRLA, TMs pearly gray, turbinates non-edematous without discharge, post-pharynx non erythematous. Neck: Supple without  lymphadenopathy. Lungs: Clear to auscultation without wheezing, rhonchi or rales. {no increased work of breathing. CV: Normal S1, S2 without murmurs. Abdomen: Nondistended, nontender. Skin: Warm and dry, without lesions or rashes. Extremities:  No clubbing, cyanosis or edema. Neuro:   Grossly intact.  Diagnositics/Labs: None today   Assessment and plan: Allergic rhinitis with conjunctivitis  -Continue avoidance measures for mold -For allergy symptom control can use as needed Claritin or Benadryl (ok per your cardiologist)  History of mild intermittent asthma -Have access to albuterol inhaler 2 puffs every 4-6 hours as needed for cough/wheeze/shortness of breath/chest tightness.  May use 15-20 minutes prior to activity.   Monitor frequency of use.    History of eczema -Keep skin moisturized especially after bathing -Can use over-the-counter hydrocortisone as needed for itchy, dry, patchy, scaly or  flaky areas  Adverse food reaction -Skin testing to mushroom and coconut was negative from summer 2022 -If interested in putting these foods back in the diet let us know  Follow-up as needed      No follow-ups on file.  I appreciate the opportunity to take part in Kristi's care. Please do not hesitate to contact me with questions.  Sincerely,   Prudy Feeler, MD Allergy/Immunology [facility]

## 2021-01-14 NOTE — Patient Instructions (Addendum)
-  Continue avoidance measures for mold -For allergy symptom control can use as needed Claritin or Benadryl (ok per your cardiologist)  -Have access to albuterol inhaler 2 puffs every 4-6 hours as needed for cough/wheeze/shortness of breath/chest tightness.  May use 15-20 minutes prior to activity.   Monitor frequency of use.    -Keep skin moisturized especially after bathing -Can use over-the-counter hydrocortisone as needed for itchy, dry, patchy, scaly or flaky areas  -Skin testing to mushroom and coconut was negative from summer 2022 -If interested in putting these foods back in the diet let us know  Follow-up as needed

## 2021-01-15 ENCOUNTER — Other Ambulatory Visit (HOSPITAL_COMMUNITY): Payer: Self-pay | Admitting: Internal Medicine

## 2021-01-15 DIAGNOSIS — E11610 Type 2 diabetes mellitus with diabetic neuropathic arthropathy: Secondary | ICD-10-CM | POA: Diagnosis not present

## 2021-01-15 DIAGNOSIS — E119 Type 2 diabetes mellitus without complications: Secondary | ICD-10-CM | POA: Diagnosis not present

## 2021-01-15 DIAGNOSIS — I5022 Chronic systolic (congestive) heart failure: Secondary | ICD-10-CM | POA: Diagnosis not present

## 2021-01-15 DIAGNOSIS — G4733 Obstructive sleep apnea (adult) (pediatric): Secondary | ICD-10-CM | POA: Diagnosis not present

## 2021-01-15 DIAGNOSIS — I509 Heart failure, unspecified: Secondary | ICD-10-CM | POA: Diagnosis not present

## 2021-01-15 DIAGNOSIS — Z515 Encounter for palliative care: Secondary | ICD-10-CM | POA: Diagnosis not present

## 2021-01-15 DIAGNOSIS — I10 Essential (primary) hypertension: Secondary | ICD-10-CM | POA: Diagnosis not present

## 2021-01-15 DIAGNOSIS — R600 Localized edema: Secondary | ICD-10-CM | POA: Diagnosis not present

## 2021-01-15 DIAGNOSIS — F32A Depression, unspecified: Secondary | ICD-10-CM | POA: Diagnosis not present

## 2021-01-15 DIAGNOSIS — R109 Unspecified abdominal pain: Secondary | ICD-10-CM | POA: Diagnosis not present

## 2021-01-16 ENCOUNTER — Ambulatory Visit (HOSPITAL_COMMUNITY)
Admission: RE | Admit: 2021-01-16 | Discharge: 2021-01-16 | Disposition: A | Discharge status: Home / Self Care | Payer: Medicaid Other | Source: Ambulatory Visit | Attending: Cardiology | Admitting: Cardiology

## 2021-01-16 ENCOUNTER — Other Ambulatory Visit: Payer: Self-pay

## 2021-01-16 DIAGNOSIS — I10 Essential (primary) hypertension: Secondary | ICD-10-CM | POA: Diagnosis not present

## 2021-01-16 DIAGNOSIS — G4733 Obstructive sleep apnea (adult) (pediatric): Secondary | ICD-10-CM | POA: Diagnosis not present

## 2021-01-16 DIAGNOSIS — I509 Heart failure, unspecified: Secondary | ICD-10-CM | POA: Diagnosis not present

## 2021-01-16 DIAGNOSIS — E119 Type 2 diabetes mellitus without complications: Secondary | ICD-10-CM | POA: Diagnosis not present

## 2021-01-16 DIAGNOSIS — Z515 Encounter for palliative care: Secondary | ICD-10-CM | POA: Diagnosis not present

## 2021-01-16 DIAGNOSIS — F32A Depression, unspecified: Secondary | ICD-10-CM | POA: Diagnosis not present

## 2021-01-16 DIAGNOSIS — R109 Unspecified abdominal pain: Secondary | ICD-10-CM | POA: Diagnosis not present

## 2021-01-16 DIAGNOSIS — I5022 Chronic systolic (congestive) heart failure: Secondary | ICD-10-CM | POA: Diagnosis present

## 2021-01-16 LAB — BASIC METABOLIC PANEL
Anion gap: 9 (ref 5–15)
BUN: 27 mg/dL — ABNORMAL HIGH (ref 6–20)
CO2: 24 mmol/L (ref 22–32)
Calcium: 9.3 mg/dL (ref 8.9–10.3)
Chloride: 100 mmol/L (ref 98–111)
Creatinine, Ser: 1.18 mg/dL (ref 0.61–1.24)
GFR, Estimated: >60 mL/min (ref >60)
Glucose, Bld: 181 mg/dL — ABNORMAL HIGH (ref 70–99)
Potassium: 4.5 mmol/L (ref 3.5–5.1)
Sodium: 133 mmol/L — ABNORMAL LOW (ref 135–145)

## 2021-01-16 LAB — CBC
HCT: 38.9 % — ABNORMAL LOW (ref 39.0–52.0)
Hemoglobin: 12.9 g/dL — ABNORMAL LOW (ref 13.0–17.0)
MCH: 30.2 pg (ref 26.0–34.0)
MCHC: 33.2 g/dL (ref 30.0–36.0)
MCV: 91.1 fL (ref 80.0–100.0)
Platelets: 243 10*3/uL (ref 150–400)
RBC: 4.27 MIL/uL (ref 4.22–5.81)
RDW: 13.7 % (ref 11.5–15.5)
WBC: 7.7 10*3/uL (ref 4.0–10.5)
nRBC: 0 % (ref 0.0–0.2)

## 2021-01-17 DIAGNOSIS — F32A Depression, unspecified: Secondary | ICD-10-CM | POA: Diagnosis not present

## 2021-01-17 DIAGNOSIS — G4733 Obstructive sleep apnea (adult) (pediatric): Secondary | ICD-10-CM | POA: Diagnosis not present

## 2021-01-17 DIAGNOSIS — I10 Essential (primary) hypertension: Secondary | ICD-10-CM | POA: Diagnosis not present

## 2021-01-17 DIAGNOSIS — E119 Type 2 diabetes mellitus without complications: Secondary | ICD-10-CM | POA: Diagnosis not present

## 2021-01-17 DIAGNOSIS — R109 Unspecified abdominal pain: Secondary | ICD-10-CM | POA: Diagnosis not present

## 2021-01-17 DIAGNOSIS — I509 Heart failure, unspecified: Secondary | ICD-10-CM | POA: Diagnosis not present

## 2021-01-17 DIAGNOSIS — Z515 Encounter for palliative care: Secondary | ICD-10-CM | POA: Diagnosis not present

## 2021-01-18 ENCOUNTER — Other Ambulatory Visit (HOSPITAL_COMMUNITY): Payer: Self-pay | Admitting: Physician Assistant

## 2021-01-18 DIAGNOSIS — I10 Essential (primary) hypertension: Secondary | ICD-10-CM | POA: Diagnosis not present

## 2021-01-18 DIAGNOSIS — F32A Depression, unspecified: Secondary | ICD-10-CM | POA: Diagnosis not present

## 2021-01-18 DIAGNOSIS — G4733 Obstructive sleep apnea (adult) (pediatric): Secondary | ICD-10-CM | POA: Diagnosis not present

## 2021-01-18 DIAGNOSIS — Z515 Encounter for palliative care: Secondary | ICD-10-CM | POA: Diagnosis not present

## 2021-01-18 DIAGNOSIS — I509 Heart failure, unspecified: Secondary | ICD-10-CM | POA: Diagnosis not present

## 2021-01-18 DIAGNOSIS — R109 Unspecified abdominal pain: Secondary | ICD-10-CM | POA: Diagnosis not present

## 2021-01-18 DIAGNOSIS — E119 Type 2 diabetes mellitus without complications: Secondary | ICD-10-CM | POA: Diagnosis not present

## 2021-01-19 ENCOUNTER — Other Ambulatory Visit (HOSPITAL_COMMUNITY): Payer: Medicaid Other

## 2021-01-19 DIAGNOSIS — I509 Heart failure, unspecified: Secondary | ICD-10-CM | POA: Diagnosis not present

## 2021-01-19 DIAGNOSIS — I10 Essential (primary) hypertension: Secondary | ICD-10-CM | POA: Diagnosis not present

## 2021-01-19 DIAGNOSIS — R109 Unspecified abdominal pain: Secondary | ICD-10-CM | POA: Diagnosis not present

## 2021-01-19 DIAGNOSIS — G4733 Obstructive sleep apnea (adult) (pediatric): Secondary | ICD-10-CM | POA: Diagnosis not present

## 2021-01-19 DIAGNOSIS — E119 Type 2 diabetes mellitus without complications: Secondary | ICD-10-CM | POA: Diagnosis not present

## 2021-01-19 DIAGNOSIS — Z515 Encounter for palliative care: Secondary | ICD-10-CM | POA: Diagnosis not present

## 2021-01-19 DIAGNOSIS — F32A Depression, unspecified: Secondary | ICD-10-CM | POA: Diagnosis not present

## 2021-01-20 ENCOUNTER — Other Ambulatory Visit (HOSPITAL_COMMUNITY): Payer: Self-pay | Admitting: Cardiology

## 2021-01-20 DIAGNOSIS — I10 Essential (primary) hypertension: Secondary | ICD-10-CM | POA: Diagnosis not present

## 2021-01-20 DIAGNOSIS — Z515 Encounter for palliative care: Secondary | ICD-10-CM | POA: Diagnosis not present

## 2021-01-20 DIAGNOSIS — E119 Type 2 diabetes mellitus without complications: Secondary | ICD-10-CM | POA: Diagnosis not present

## 2021-01-20 DIAGNOSIS — F32A Depression, unspecified: Secondary | ICD-10-CM | POA: Diagnosis not present

## 2021-01-20 DIAGNOSIS — R109 Unspecified abdominal pain: Secondary | ICD-10-CM | POA: Diagnosis not present

## 2021-01-20 DIAGNOSIS — I509 Heart failure, unspecified: Secondary | ICD-10-CM | POA: Diagnosis not present

## 2021-01-20 DIAGNOSIS — G4733 Obstructive sleep apnea (adult) (pediatric): Secondary | ICD-10-CM | POA: Diagnosis not present

## 2021-01-21 DIAGNOSIS — R109 Unspecified abdominal pain: Secondary | ICD-10-CM | POA: Diagnosis not present

## 2021-01-21 DIAGNOSIS — I509 Heart failure, unspecified: Secondary | ICD-10-CM | POA: Diagnosis not present

## 2021-01-21 DIAGNOSIS — F32A Depression, unspecified: Secondary | ICD-10-CM | POA: Diagnosis not present

## 2021-01-21 DIAGNOSIS — I10 Essential (primary) hypertension: Secondary | ICD-10-CM | POA: Diagnosis not present

## 2021-01-21 DIAGNOSIS — Z515 Encounter for palliative care: Secondary | ICD-10-CM | POA: Diagnosis not present

## 2021-01-21 DIAGNOSIS — G4733 Obstructive sleep apnea (adult) (pediatric): Secondary | ICD-10-CM | POA: Diagnosis not present

## 2021-01-21 DIAGNOSIS — E119 Type 2 diabetes mellitus without complications: Secondary | ICD-10-CM | POA: Diagnosis not present

## 2021-01-21 DIAGNOSIS — E1169 Type 2 diabetes mellitus with other specified complication: Secondary | ICD-10-CM | POA: Diagnosis not present

## 2021-01-22 DIAGNOSIS — I10 Essential (primary) hypertension: Secondary | ICD-10-CM | POA: Diagnosis not present

## 2021-01-22 DIAGNOSIS — G4733 Obstructive sleep apnea (adult) (pediatric): Secondary | ICD-10-CM | POA: Diagnosis not present

## 2021-01-22 DIAGNOSIS — I509 Heart failure, unspecified: Secondary | ICD-10-CM | POA: Diagnosis not present

## 2021-01-22 DIAGNOSIS — Z515 Encounter for palliative care: Secondary | ICD-10-CM | POA: Diagnosis not present

## 2021-01-22 DIAGNOSIS — R109 Unspecified abdominal pain: Secondary | ICD-10-CM | POA: Diagnosis not present

## 2021-01-22 DIAGNOSIS — F32A Depression, unspecified: Secondary | ICD-10-CM | POA: Diagnosis not present

## 2021-01-22 DIAGNOSIS — E119 Type 2 diabetes mellitus without complications: Secondary | ICD-10-CM | POA: Diagnosis not present

## 2021-01-23 DIAGNOSIS — E119 Type 2 diabetes mellitus without complications: Secondary | ICD-10-CM | POA: Diagnosis not present

## 2021-01-23 DIAGNOSIS — Z515 Encounter for palliative care: Secondary | ICD-10-CM | POA: Diagnosis not present

## 2021-01-23 DIAGNOSIS — I10 Essential (primary) hypertension: Secondary | ICD-10-CM | POA: Diagnosis not present

## 2021-01-23 DIAGNOSIS — G4733 Obstructive sleep apnea (adult) (pediatric): Secondary | ICD-10-CM | POA: Diagnosis not present

## 2021-01-23 DIAGNOSIS — R109 Unspecified abdominal pain: Secondary | ICD-10-CM | POA: Diagnosis not present

## 2021-01-23 DIAGNOSIS — I509 Heart failure, unspecified: Secondary | ICD-10-CM | POA: Diagnosis not present

## 2021-01-23 DIAGNOSIS — F32A Depression, unspecified: Secondary | ICD-10-CM | POA: Diagnosis not present

## 2021-01-24 DIAGNOSIS — E119 Type 2 diabetes mellitus without complications: Secondary | ICD-10-CM | POA: Diagnosis not present

## 2021-01-24 DIAGNOSIS — I509 Heart failure, unspecified: Secondary | ICD-10-CM | POA: Diagnosis not present

## 2021-01-24 DIAGNOSIS — Z515 Encounter for palliative care: Secondary | ICD-10-CM | POA: Diagnosis not present

## 2021-01-24 DIAGNOSIS — F32A Depression, unspecified: Secondary | ICD-10-CM | POA: Diagnosis not present

## 2021-01-24 DIAGNOSIS — R109 Unspecified abdominal pain: Secondary | ICD-10-CM | POA: Diagnosis not present

## 2021-01-24 DIAGNOSIS — G4733 Obstructive sleep apnea (adult) (pediatric): Secondary | ICD-10-CM | POA: Diagnosis not present

## 2021-01-24 DIAGNOSIS — I10 Essential (primary) hypertension: Secondary | ICD-10-CM | POA: Diagnosis not present

## 2021-01-25 DIAGNOSIS — E119 Type 2 diabetes mellitus without complications: Secondary | ICD-10-CM | POA: Diagnosis not present

## 2021-01-25 DIAGNOSIS — G4733 Obstructive sleep apnea (adult) (pediatric): Secondary | ICD-10-CM | POA: Diagnosis not present

## 2021-01-25 DIAGNOSIS — I10 Essential (primary) hypertension: Secondary | ICD-10-CM | POA: Diagnosis not present

## 2021-01-25 DIAGNOSIS — I509 Heart failure, unspecified: Secondary | ICD-10-CM | POA: Diagnosis not present

## 2021-01-25 DIAGNOSIS — Z515 Encounter for palliative care: Secondary | ICD-10-CM | POA: Diagnosis not present

## 2021-01-25 DIAGNOSIS — F32A Depression, unspecified: Secondary | ICD-10-CM | POA: Diagnosis not present

## 2021-01-25 DIAGNOSIS — R109 Unspecified abdominal pain: Secondary | ICD-10-CM | POA: Diagnosis not present

## 2021-01-26 DIAGNOSIS — I10 Essential (primary) hypertension: Secondary | ICD-10-CM | POA: Diagnosis not present

## 2021-01-26 DIAGNOSIS — E119 Type 2 diabetes mellitus without complications: Secondary | ICD-10-CM | POA: Diagnosis not present

## 2021-01-26 DIAGNOSIS — R109 Unspecified abdominal pain: Secondary | ICD-10-CM | POA: Diagnosis not present

## 2021-01-26 DIAGNOSIS — Z515 Encounter for palliative care: Secondary | ICD-10-CM | POA: Diagnosis not present

## 2021-01-26 DIAGNOSIS — F32A Depression, unspecified: Secondary | ICD-10-CM | POA: Diagnosis not present

## 2021-01-26 DIAGNOSIS — I509 Heart failure, unspecified: Secondary | ICD-10-CM | POA: Diagnosis not present

## 2021-01-26 DIAGNOSIS — G4733 Obstructive sleep apnea (adult) (pediatric): Secondary | ICD-10-CM | POA: Diagnosis not present

## 2021-01-27 DIAGNOSIS — G4733 Obstructive sleep apnea (adult) (pediatric): Secondary | ICD-10-CM | POA: Diagnosis not present

## 2021-01-27 DIAGNOSIS — I509 Heart failure, unspecified: Secondary | ICD-10-CM | POA: Diagnosis not present

## 2021-01-27 DIAGNOSIS — F32A Depression, unspecified: Secondary | ICD-10-CM | POA: Diagnosis not present

## 2021-01-27 DIAGNOSIS — Z515 Encounter for palliative care: Secondary | ICD-10-CM | POA: Diagnosis not present

## 2021-01-27 DIAGNOSIS — I10 Essential (primary) hypertension: Secondary | ICD-10-CM | POA: Diagnosis not present

## 2021-01-27 DIAGNOSIS — E119 Type 2 diabetes mellitus without complications: Secondary | ICD-10-CM | POA: Diagnosis not present

## 2021-01-27 DIAGNOSIS — R109 Unspecified abdominal pain: Secondary | ICD-10-CM | POA: Diagnosis not present

## 2021-01-28 DIAGNOSIS — F32A Depression, unspecified: Secondary | ICD-10-CM | POA: Diagnosis not present

## 2021-01-28 DIAGNOSIS — G4733 Obstructive sleep apnea (adult) (pediatric): Secondary | ICD-10-CM | POA: Diagnosis not present

## 2021-01-28 DIAGNOSIS — Z515 Encounter for palliative care: Secondary | ICD-10-CM | POA: Diagnosis not present

## 2021-01-28 DIAGNOSIS — I509 Heart failure, unspecified: Secondary | ICD-10-CM | POA: Diagnosis not present

## 2021-01-28 DIAGNOSIS — R109 Unspecified abdominal pain: Secondary | ICD-10-CM | POA: Diagnosis not present

## 2021-01-28 DIAGNOSIS — I10 Essential (primary) hypertension: Secondary | ICD-10-CM | POA: Diagnosis not present

## 2021-01-28 DIAGNOSIS — E119 Type 2 diabetes mellitus without complications: Secondary | ICD-10-CM | POA: Diagnosis not present

## 2021-01-29 DIAGNOSIS — F32A Depression, unspecified: Secondary | ICD-10-CM | POA: Diagnosis not present

## 2021-01-29 DIAGNOSIS — I509 Heart failure, unspecified: Secondary | ICD-10-CM | POA: Diagnosis not present

## 2021-01-29 DIAGNOSIS — I10 Essential (primary) hypertension: Secondary | ICD-10-CM | POA: Diagnosis not present

## 2021-01-29 DIAGNOSIS — E119 Type 2 diabetes mellitus without complications: Secondary | ICD-10-CM | POA: Diagnosis not present

## 2021-01-29 DIAGNOSIS — R109 Unspecified abdominal pain: Secondary | ICD-10-CM | POA: Diagnosis not present

## 2021-01-29 DIAGNOSIS — G4733 Obstructive sleep apnea (adult) (pediatric): Secondary | ICD-10-CM | POA: Diagnosis not present

## 2021-01-29 DIAGNOSIS — Z515 Encounter for palliative care: Secondary | ICD-10-CM | POA: Diagnosis not present

## 2021-01-30 DIAGNOSIS — F32A Depression, unspecified: Secondary | ICD-10-CM | POA: Diagnosis not present

## 2021-01-30 DIAGNOSIS — Z515 Encounter for palliative care: Secondary | ICD-10-CM | POA: Diagnosis not present

## 2021-01-30 DIAGNOSIS — I10 Essential (primary) hypertension: Secondary | ICD-10-CM | POA: Diagnosis not present

## 2021-01-30 DIAGNOSIS — R109 Unspecified abdominal pain: Secondary | ICD-10-CM | POA: Diagnosis not present

## 2021-01-30 DIAGNOSIS — G4733 Obstructive sleep apnea (adult) (pediatric): Secondary | ICD-10-CM | POA: Diagnosis not present

## 2021-01-30 DIAGNOSIS — I509 Heart failure, unspecified: Secondary | ICD-10-CM | POA: Diagnosis not present

## 2021-01-30 DIAGNOSIS — E119 Type 2 diabetes mellitus without complications: Secondary | ICD-10-CM | POA: Diagnosis not present

## 2021-01-31 DIAGNOSIS — Z515 Encounter for palliative care: Secondary | ICD-10-CM | POA: Diagnosis not present

## 2021-01-31 DIAGNOSIS — I10 Essential (primary) hypertension: Secondary | ICD-10-CM | POA: Diagnosis not present

## 2021-01-31 DIAGNOSIS — R109 Unspecified abdominal pain: Secondary | ICD-10-CM | POA: Diagnosis not present

## 2021-01-31 DIAGNOSIS — G4733 Obstructive sleep apnea (adult) (pediatric): Secondary | ICD-10-CM | POA: Diagnosis not present

## 2021-01-31 DIAGNOSIS — E119 Type 2 diabetes mellitus without complications: Secondary | ICD-10-CM | POA: Diagnosis not present

## 2021-01-31 DIAGNOSIS — I509 Heart failure, unspecified: Secondary | ICD-10-CM | POA: Diagnosis not present

## 2021-01-31 DIAGNOSIS — F32A Depression, unspecified: Secondary | ICD-10-CM | POA: Diagnosis not present

## 2021-02-01 DIAGNOSIS — I10 Essential (primary) hypertension: Secondary | ICD-10-CM | POA: Diagnosis not present

## 2021-02-01 DIAGNOSIS — R109 Unspecified abdominal pain: Secondary | ICD-10-CM | POA: Diagnosis not present

## 2021-02-01 DIAGNOSIS — E119 Type 2 diabetes mellitus without complications: Secondary | ICD-10-CM | POA: Diagnosis not present

## 2021-02-01 DIAGNOSIS — F32A Depression, unspecified: Secondary | ICD-10-CM | POA: Diagnosis not present

## 2021-02-01 DIAGNOSIS — G4733 Obstructive sleep apnea (adult) (pediatric): Secondary | ICD-10-CM | POA: Diagnosis not present

## 2021-02-01 DIAGNOSIS — Z515 Encounter for palliative care: Secondary | ICD-10-CM | POA: Diagnosis not present

## 2021-02-01 DIAGNOSIS — I509 Heart failure, unspecified: Secondary | ICD-10-CM | POA: Diagnosis not present

## 2021-02-02 DIAGNOSIS — I509 Heart failure, unspecified: Secondary | ICD-10-CM | POA: Diagnosis not present

## 2021-02-02 DIAGNOSIS — E119 Type 2 diabetes mellitus without complications: Secondary | ICD-10-CM | POA: Diagnosis not present

## 2021-02-02 DIAGNOSIS — Z515 Encounter for palliative care: Secondary | ICD-10-CM | POA: Diagnosis not present

## 2021-02-02 DIAGNOSIS — F32A Depression, unspecified: Secondary | ICD-10-CM | POA: Diagnosis not present

## 2021-02-02 DIAGNOSIS — R109 Unspecified abdominal pain: Secondary | ICD-10-CM | POA: Diagnosis not present

## 2021-02-02 DIAGNOSIS — G4733 Obstructive sleep apnea (adult) (pediatric): Secondary | ICD-10-CM | POA: Diagnosis not present

## 2021-02-02 DIAGNOSIS — I10 Essential (primary) hypertension: Secondary | ICD-10-CM | POA: Diagnosis not present

## 2021-02-03 DIAGNOSIS — I509 Heart failure, unspecified: Secondary | ICD-10-CM | POA: Diagnosis not present

## 2021-02-03 DIAGNOSIS — I10 Essential (primary) hypertension: Secondary | ICD-10-CM | POA: Diagnosis not present

## 2021-02-03 DIAGNOSIS — R109 Unspecified abdominal pain: Secondary | ICD-10-CM | POA: Diagnosis not present

## 2021-02-03 DIAGNOSIS — G4733 Obstructive sleep apnea (adult) (pediatric): Secondary | ICD-10-CM | POA: Diagnosis not present

## 2021-02-03 DIAGNOSIS — F32A Depression, unspecified: Secondary | ICD-10-CM | POA: Diagnosis not present

## 2021-02-03 DIAGNOSIS — Z515 Encounter for palliative care: Secondary | ICD-10-CM | POA: Diagnosis not present

## 2021-02-03 DIAGNOSIS — E119 Type 2 diabetes mellitus without complications: Secondary | ICD-10-CM | POA: Diagnosis not present

## 2021-02-04 ENCOUNTER — Telehealth: Payer: Self-pay | Admitting: *Deleted

## 2021-02-04 ENCOUNTER — Encounter (HOSPITAL_COMMUNITY): Payer: Medicaid Other

## 2021-02-04 DIAGNOSIS — I10 Essential (primary) hypertension: Secondary | ICD-10-CM | POA: Diagnosis not present

## 2021-02-04 DIAGNOSIS — R109 Unspecified abdominal pain: Secondary | ICD-10-CM | POA: Diagnosis not present

## 2021-02-04 DIAGNOSIS — G4733 Obstructive sleep apnea (adult) (pediatric): Secondary | ICD-10-CM | POA: Diagnosis not present

## 2021-02-04 DIAGNOSIS — Z515 Encounter for palliative care: Secondary | ICD-10-CM | POA: Diagnosis not present

## 2021-02-04 DIAGNOSIS — I509 Heart failure, unspecified: Secondary | ICD-10-CM | POA: Diagnosis not present

## 2021-02-04 DIAGNOSIS — E119 Type 2 diabetes mellitus without complications: Secondary | ICD-10-CM | POA: Diagnosis not present

## 2021-02-04 DIAGNOSIS — F32A Depression, unspecified: Secondary | ICD-10-CM | POA: Diagnosis not present

## 2021-02-04 NOTE — Telephone Encounter (Signed)
Received call from [place], SW with [facility]. She is requesting PCS for patient. Patient's last OV was 12/09/20 so he is w/i his 90 day window. Will forward to Lela to prepare forms and place in Red Team's box for completion.

## 2021-02-04 NOTE — Progress Notes (Incomplete)
PCP: [name], MD HF Cardiologist: Dr. Haroldine Laws   HPI: Jonathan [name] is a 35 y.o.with a history of chronic HFrEF, NICM, HTN, asthma, OSA, morbid obesity, and uncontrolled DM.    Admitted 10/2016 with progressive SOB x 2 weeks. He reports h/o systolic CHF with EF 74-08% by Echo in [place] in 2017. Cath around that time without any significant CAD per patient. He was diuresed with IV Lasix, discharge weight was 373 pounds. He had run out of most of his medications due to moving to [place] and not having insurance. Meds restarted at discharge: Coreg, Mearl Latin, Lasix.    He had repeat Echo 03/02/2017. EF had normalized to 55% but went back down when he was off HF meds. EF in 2021 back down to 30-35%. He has refused ICD.    Over the last year he has had ongoing difficulty with medication/dietary compliance resulting in volume overload. Managing diuretics in the community has been a challenge with frequent adjustments. At one time he was followed by HF paramedicine but was discharged due poor compliance.    He has been followed in the HF clinic and was last seen 05/2020.  He cancelled follow up appointments 08/2020 and 09/2020.    10/16/20 Hgb A1C > 14. Saw PCP last week and was volume overloaded and discussion about starting insulin. He was reluctant to start due to needle phobia.  Dr Gilford Rile noted he had not been able to tolerate his diuretics due to running out of his potassium supplement.  Weight at that time 395 pounds.   Admitted 10/28/20 with A/C HFrEF, A fib RVR , and marked volume overload. Hospital course complicated by low output so milrinone started. Diuresed with lasix drip + metolazone+ diamox.   Failed milrinone wean and was discharged on milrinone. He was not a candidate for advanced therapies due to noncompliance and uncontrolled diabetes. HGB A1C >14, Palliative consulted for goals of care. He elected DNR/DNI. Referred to [facility] for Hospice services.   Seen in ED 11/25/20 with home  milrinone pump issues. Switched to hospital pump and instructed to wait for [facility] RN to help troubleshoot. Left before Hospice nurse could fix pump.  He is here today for 4 week CHF f/u. Has been feeling well. No chest pain or dyspnea. Denies orthopnea or PND. Has intermittent leg edema that is present during day and resolves overnight. He has been wearing compression stockings. Weight in clinic stable at 340 lb. He is compliant with all medications.   Reports HHRN noticed he had no blood return on one of PICC line lumens.  Cardiac Studies   - Echo 9/22: EF<20%, severely decreased LV, grade II DD.  - Echo 10/21: EF 30-35% with moderate RV dysfunction in setting of recurrent RBBB with significant dyssynchrony.  - Echo 04/2019: EF 30-35%  - Echo 2019: EF 55%   - Echo EF 15-20% in [place] in 2017. Cath around that time without   ROS: All systems negative except as listed in HPI, PMH and Problem List.  SH:  Social History   Socioeconomic History   Marital status: Married    Spouse name: Not on file   Number of children: Not on file   Years of education: Not on file   Highest education level: Not on file  Occupational History   Not on file  Tobacco Use   Smoking status: Never   Smokeless tobacco: Never  Vaping Use   Vaping Use: Never used  Substance and Sexual Activity  Alcohol use: No   Drug use: No   Sexual activity: Never  Other Topics Concern   Not on file  Social History Narrative   Not on file   Social Determinants of Health   Financial Resource Strain: Low Risk    Difficulty of Paying Living Expenses: Not very hard  Food Insecurity: No Food Insecurity   Worried About Running Out of Food in the Last Year: Never true   Ran Out of Food in the Last Year: Never true  Transportation Needs: No Transportation Needs   Lack of Transportation (Medical): No   Lack of Transportation (Non-Medical): No  Physical Activity: Not on file  Stress: Not on file  Social Connections: Not on  file  Intimate Partner Violence: Not on file   FH:  Family History  Problem Relation Age of Onset   Diabetes Mellitus II Mother    Heart failure Mother    Hypertension Mother    Heart disease Mother    Heart failure Father    Kidney disease Father    Heart disease Father    Congestive Heart Failure Neg Hx    Past Medical History:  Diagnosis Date   Acute pain of left foot 05/03/2017   Asthma    Asthma, chronic, mild persistent, uncomplicated 41/28/7867   Charcot ankle, left 05/31/2017   CHF (congestive heart failure) (HCC)    Chronic systolic (congestive) heart failure ([place])    Diabetes mellitus type 2 in obese ([place]) 04/21/2014   Diabetic polyneuropathy associated with type 2 diabetes mellitus ([place]) 03/14/2017   Essential hypertension 10/26/2016   Hypertension    Obesity    Obesity, Class III, BMI 40-49.9 (morbid obesity) ([place]) 10/26/2016   OSA (obstructive sleep apnea) 12/02/2016   Type 2 diabetes mellitus with diabetic neuropathy (HCC)    Current Outpatient Medications  Medication Sig Dispense Refill   Accu-Chek Softclix Lancets lancets Check blood sugar 3x a day 100 each 11   albuterol (VENTOLIN HFA) 108 (90 Base) MCG/ACT inhaler Inhale 2 puffs into the lungs every 4 (four) hours as needed for wheezing or shortness of breath. 18 g 1   amiodarone (PACERONE) 200 MG tablet Take 0.5 tablets (100 mg total) by mouth daily. 15 tablet 2   amiodarone (PACERONE) 200 MG tablet Patient takes 1/2 tablet daily.     apixaban (ELIQUIS) 5 MG TABS tablet Take 1 tablet (5 mg total) by mouth 2 (two) times daily. 60 tablet 2   azelastine (ASTELIN) 0.1 % nasal spray Place 2 sprays into both nostrils 2 (two) times daily. Use in each nostril as directed (Patient not taking: Reported on 01/14/2021) 30 mL 5   blood glucose meter kit and supplies KIT Dispense based on patient and insurance preference. Use up to four times daily as directed. (FOR ICD-9 250.00, 250.01). For QAC - HS accuchecks. May  switch to any brand. (Patient taking differently: Inject 1 each into the skin See admin instructions. Dispense based on patient and insurance preference. Use up to four times daily as directed. (FOR ICD-9 250.00, 250.01). For QAC - HS accuchecks. May switch to any brand.) 1 each 1   Blood Glucose Monitoring Suppl (ACCU-CHEK GUIDE) w/Device KIT Check blood sugar 3 times a day (Patient taking differently: 1 each by Other route See admin instructions. Check blood sugar 3 times a day) 1 kit 1   Continuous Blood Gluc Sensor (FREESTYLE LIBRE 2 SENSOR) MISC Use to check blood sugar at least 6 times a day 2 each 11  digoxin (LANOXIN) 0.125 MG tablet Take 1 tablet (125 mcg total) by mouth daily. 90 tablet 3   fluticasone (FLONASE) 50 MCG/ACT nasal spray Apply 2 sprays each nostril daily for 1-2 weeks (Patient not taking: Reported on 01/14/2021) 16 g 3   gabapentin (NEURONTIN) 300 MG capsule Take 1 capsule (300 mg total) by mouth 3 (three) times daily. 90 capsule 2   glucose blood (ACCU-CHEK GUIDE) test strip Check blood sugar 3 times per day 100 each 11   insulin aspart (NOVOLOG) 100 UNIT/ML injection Inject 20 Units into the skin 3 (three) times daily with meals. 20 mL 11   insulin glargine (LANTUS) 100 UNIT/ML injection Inject 0.3 mLs (30 Units total) into the skin 2 (two) times daily. 20 mL 11   Insulin Syringe-Needle U-100 30G X 1/2" 1 ML MISC Use to inject insulin 4 times a day. 100 each 2   isosorbide-hydrALAZINE (BIDIL) 20-37.5 MG tablet Take 1 tablet by mouth 3 (three) times daily. 90 tablet 2   KLOR-CON M20 20 MEQ tablet TAKE 4 TABLETS (80 MEQ TOTAL) BY MOUTH 2 (TWO) TIMES DAILY. 120 tablet 4   loratadine (CLARITIN) 10 MG tablet Take 1 tablet (10 mg total) by mouth 2 (two) times daily as needed for allergies (Can use an extra dose during flare ups.). 60 tablet 5   metolazone (ZAROXOLYN) 2.5 MG tablet Take 1 tablet (2.5 mg total) by mouth 2 (two) times a week. Take 2 times a week on Mondays and Fridays  starting 11/24/2020 8 tablet 2   milrinone (PRIMACOR) 20 MG/100 ML SOLN infusion Inject 0.0622 mg/min into the vein continuous.     nystatin powder Apply 1 application topically 3 (three) times daily. (Patient taking differently: Apply 1 application topically 3 (three) times daily as needed (rash).) 15 g 0   sacubitril-valsartan (ENTRESTO) 24-26 MG Take 1 tablet by mouth 2 (two) times daily. 60 tablet 3   spironolactone (ALDACTONE) 25 MG tablet Take 0.5 tablets (12.5 mg total) by mouth daily. 45 tablet 3   torsemide (DEMADEX) 20 MG tablet TAKE 4 TABLETS TWICE DAILY 161 tablet 3   TRULICITY 1.5 WR/6.0AV SOPN INJECT 1.5 MG INTO THE SKIN ONCE A WEEK. 2 mL 5   No current facility-administered medications for this visit.   There were no vitals taken for this visit.  Wt Readings from Last 3 Encounters:  01/14/21 (!) 154.7 kg (341 lb 0.8 oz)  01/07/21 (!) 154.7 kg (341 lb 1.6 oz)  12/09/20 (!) 154.8 kg (341 lb 3.2 oz)   PHYSICAL EXAM: General:  Well appearing. Walked into clinic. Wife present. HEENT: normal Neck: supple. no JVD. Carotids 2+ bilat; no bruits. No lymphadenopathy or thryomegaly appreciated. Cor: PMI nondisplaced. Regular rhythm, tachycardic. No rubs, gallops or murmurs. Lungs: clear Abdomen: soft, nontender, nondistended. No hepatosplenomegaly.  Extremities: no cyanosis, clubbing, rash, trace edema, RUE PICC line. No erythema, induration or drainage.  Neuro: alert & orientedx3, cranial nerves grossly intact. moves all 4 extremities w/o difficulty. Affect pleasant   ECG: Sinus tach 111 bpm, RBBB  ASSESSMENT & PLAN: End Stage HFrEF , NICM  - Echo back in 2021 EF 30-35 % with moderate RV dysfunction in the setting of recurrent RBBB with significant dyssynchrony.  - Echo 10/2020 EF < 20% and moderate RV dysfunction - Failed milrinone wean. Discharged on milrinone 0.375 mg . - NYHA IIIa currently on milrinone 0.375 mcg. Volume status stable today. Continue Torsemide 80 mg bid +   metolazone 2.5 on Mondays and Fridays. -  Continue spironolacone 12.5 mg daily. - Continue digoxin 0.125 mg daily.  - Intolerant SGLT2i due to candidiasis +uncontrolled DM - Continue Bidil 1 tablet tid. - Off ARNI due to AKI. Will add entresto if renal function improved on today's labs - No beta blocker d/t low output - BMET today.  - Continue home milirinone. He is aware we are not sure how long inotrope therapy will provide benefit.  [facility] noting difficulty drawing blood back on one of PICC line lumens. He was sent down to infusion room for evaluation by IV team after today's appointment to see if needs TPA. Should not have routine labs drawn off PICC line. - Not candidate for VAD or transplant currently due to size, medical compliance and Hgba1c > 14. Had outside labs on 11/04 with improvement in Scr from 2.1 to 1.28. This is very reassuring. He has been compliant with medical therapy since his last HF admission and working on controlling his blood sugars. Has repeat A1c with PCP in January. He is hoping to be a candidate for advanced therapies in the future. - Currently a DNR. Patient and his wife want his code status changed to Full Code. System updated.    2. Uncontrolled DM - 9/1 Hgb A1C > 14.  - Per PCP. - No IDPO2U.  - On trulicity. Encouraged weight loss.  3. CKD  -Previously stage IIIb, with Scr around 2 -Marked improvement recently. -Scr 1.23 today   4. Atrial Flutter  - EKG today --> Sinus Tach.  - Continue amiodarone 100 mg daily - Continue Eliquis 5 mg bid. No bleeding issues  5. OSA: Severe OSA AHI 65 - Does not wear CPAP.  6. Obesity - There is no height or weight on file to calculate BMI. - Discussed portion control. He is working on this.  7.  GOC - Followed by [facility] for Hospice Services.  - Code status changed from DNR to Full Code today as above. - Discussed further the role of milrinone. Patient and his wife asking about " next steps". See discussion  above.   8.  H/O medical noncompliance - Significantly improved   Follow up in 4 with APP and as scheduled 02/2021 with Dr Haroldine Laws   Jonathan Bihari PA-C 8:14 AM

## 2021-02-05 DIAGNOSIS — G4733 Obstructive sleep apnea (adult) (pediatric): Secondary | ICD-10-CM | POA: Diagnosis not present

## 2021-02-05 DIAGNOSIS — E119 Type 2 diabetes mellitus without complications: Secondary | ICD-10-CM | POA: Diagnosis not present

## 2021-02-05 DIAGNOSIS — F32A Depression, unspecified: Secondary | ICD-10-CM | POA: Diagnosis not present

## 2021-02-05 DIAGNOSIS — Z515 Encounter for palliative care: Secondary | ICD-10-CM | POA: Diagnosis not present

## 2021-02-05 DIAGNOSIS — I509 Heart failure, unspecified: Secondary | ICD-10-CM | POA: Diagnosis not present

## 2021-02-05 DIAGNOSIS — I10 Essential (primary) hypertension: Secondary | ICD-10-CM | POA: Diagnosis not present

## 2021-02-05 DIAGNOSIS — R109 Unspecified abdominal pain: Secondary | ICD-10-CM | POA: Diagnosis not present

## 2021-02-06 DIAGNOSIS — Z515 Encounter for palliative care: Secondary | ICD-10-CM | POA: Diagnosis not present

## 2021-02-06 DIAGNOSIS — R109 Unspecified abdominal pain: Secondary | ICD-10-CM | POA: Diagnosis not present

## 2021-02-06 DIAGNOSIS — I10 Essential (primary) hypertension: Secondary | ICD-10-CM | POA: Diagnosis not present

## 2021-02-06 DIAGNOSIS — E119 Type 2 diabetes mellitus without complications: Secondary | ICD-10-CM | POA: Diagnosis not present

## 2021-02-06 DIAGNOSIS — I509 Heart failure, unspecified: Secondary | ICD-10-CM | POA: Diagnosis not present

## 2021-02-06 DIAGNOSIS — G4733 Obstructive sleep apnea (adult) (pediatric): Secondary | ICD-10-CM | POA: Diagnosis not present

## 2021-02-06 DIAGNOSIS — F32A Depression, unspecified: Secondary | ICD-10-CM | POA: Diagnosis not present

## 2021-02-07 DIAGNOSIS — I10 Essential (primary) hypertension: Secondary | ICD-10-CM | POA: Diagnosis not present

## 2021-02-07 DIAGNOSIS — Z515 Encounter for palliative care: Secondary | ICD-10-CM | POA: Diagnosis not present

## 2021-02-07 DIAGNOSIS — F32A Depression, unspecified: Secondary | ICD-10-CM | POA: Diagnosis not present

## 2021-02-07 DIAGNOSIS — R109 Unspecified abdominal pain: Secondary | ICD-10-CM | POA: Diagnosis not present

## 2021-02-07 DIAGNOSIS — G4733 Obstructive sleep apnea (adult) (pediatric): Secondary | ICD-10-CM | POA: Diagnosis not present

## 2021-02-07 DIAGNOSIS — E119 Type 2 diabetes mellitus without complications: Secondary | ICD-10-CM | POA: Diagnosis not present

## 2021-02-07 DIAGNOSIS — I509 Heart failure, unspecified: Secondary | ICD-10-CM | POA: Diagnosis not present

## 2021-02-08 DIAGNOSIS — I509 Heart failure, unspecified: Secondary | ICD-10-CM | POA: Diagnosis not present

## 2021-02-08 DIAGNOSIS — Z515 Encounter for palliative care: Secondary | ICD-10-CM | POA: Diagnosis not present

## 2021-02-08 DIAGNOSIS — R109 Unspecified abdominal pain: Secondary | ICD-10-CM | POA: Diagnosis not present

## 2021-02-08 DIAGNOSIS — G4733 Obstructive sleep apnea (adult) (pediatric): Secondary | ICD-10-CM | POA: Diagnosis not present

## 2021-02-08 DIAGNOSIS — F32A Depression, unspecified: Secondary | ICD-10-CM | POA: Diagnosis not present

## 2021-02-08 DIAGNOSIS — I10 Essential (primary) hypertension: Secondary | ICD-10-CM | POA: Diagnosis not present

## 2021-02-08 DIAGNOSIS — E119 Type 2 diabetes mellitus without complications: Secondary | ICD-10-CM | POA: Diagnosis not present

## 2021-02-09 DIAGNOSIS — R109 Unspecified abdominal pain: Secondary | ICD-10-CM | POA: Diagnosis not present

## 2021-02-09 DIAGNOSIS — F32A Depression, unspecified: Secondary | ICD-10-CM | POA: Diagnosis not present

## 2021-02-09 DIAGNOSIS — E119 Type 2 diabetes mellitus without complications: Secondary | ICD-10-CM | POA: Diagnosis not present

## 2021-02-09 DIAGNOSIS — Z515 Encounter for palliative care: Secondary | ICD-10-CM | POA: Diagnosis not present

## 2021-02-09 DIAGNOSIS — G4733 Obstructive sleep apnea (adult) (pediatric): Secondary | ICD-10-CM | POA: Diagnosis not present

## 2021-02-09 DIAGNOSIS — I10 Essential (primary) hypertension: Secondary | ICD-10-CM | POA: Diagnosis not present

## 2021-02-09 DIAGNOSIS — I509 Heart failure, unspecified: Secondary | ICD-10-CM | POA: Diagnosis not present

## 2021-02-10 DIAGNOSIS — E119 Type 2 diabetes mellitus without complications: Secondary | ICD-10-CM | POA: Diagnosis not present

## 2021-02-10 DIAGNOSIS — Z515 Encounter for palliative care: Secondary | ICD-10-CM | POA: Diagnosis not present

## 2021-02-10 DIAGNOSIS — F32A Depression, unspecified: Secondary | ICD-10-CM | POA: Diagnosis not present

## 2021-02-10 DIAGNOSIS — R109 Unspecified abdominal pain: Secondary | ICD-10-CM | POA: Diagnosis not present

## 2021-02-10 DIAGNOSIS — I10 Essential (primary) hypertension: Secondary | ICD-10-CM | POA: Diagnosis not present

## 2021-02-10 DIAGNOSIS — I509 Heart failure, unspecified: Secondary | ICD-10-CM | POA: Diagnosis not present

## 2021-02-10 DIAGNOSIS — G4733 Obstructive sleep apnea (adult) (pediatric): Secondary | ICD-10-CM | POA: Diagnosis not present

## 2021-02-10 NOTE — Telephone Encounter (Signed)
PCS FORM PLACED IN RED TEAM'S BOX FOR COMPLETION.

## 2021-02-11 DIAGNOSIS — F32A Depression, unspecified: Secondary | ICD-10-CM | POA: Diagnosis not present

## 2021-02-11 DIAGNOSIS — Z515 Encounter for palliative care: Secondary | ICD-10-CM | POA: Diagnosis not present

## 2021-02-11 DIAGNOSIS — I509 Heart failure, unspecified: Secondary | ICD-10-CM | POA: Diagnosis not present

## 2021-02-11 DIAGNOSIS — I10 Essential (primary) hypertension: Secondary | ICD-10-CM | POA: Diagnosis not present

## 2021-02-11 DIAGNOSIS — G4733 Obstructive sleep apnea (adult) (pediatric): Secondary | ICD-10-CM | POA: Diagnosis not present

## 2021-02-11 DIAGNOSIS — R109 Unspecified abdominal pain: Secondary | ICD-10-CM | POA: Diagnosis not present

## 2021-02-11 DIAGNOSIS — E119 Type 2 diabetes mellitus without complications: Secondary | ICD-10-CM | POA: Diagnosis not present

## 2021-02-12 DIAGNOSIS — R109 Unspecified abdominal pain: Secondary | ICD-10-CM | POA: Diagnosis not present

## 2021-02-12 DIAGNOSIS — E119 Type 2 diabetes mellitus without complications: Secondary | ICD-10-CM | POA: Diagnosis not present

## 2021-02-12 DIAGNOSIS — I10 Essential (primary) hypertension: Secondary | ICD-10-CM | POA: Diagnosis not present

## 2021-02-12 DIAGNOSIS — G4733 Obstructive sleep apnea (adult) (pediatric): Secondary | ICD-10-CM | POA: Diagnosis not present

## 2021-02-12 DIAGNOSIS — I509 Heart failure, unspecified: Secondary | ICD-10-CM | POA: Diagnosis not present

## 2021-02-12 DIAGNOSIS — Z515 Encounter for palliative care: Secondary | ICD-10-CM | POA: Diagnosis not present

## 2021-02-12 DIAGNOSIS — F32A Depression, unspecified: Secondary | ICD-10-CM | POA: Diagnosis not present

## 2021-02-13 ENCOUNTER — Other Ambulatory Visit: Payer: Self-pay | Admitting: Internal Medicine

## 2021-02-13 ENCOUNTER — Other Ambulatory Visit (HOSPITAL_COMMUNITY): Payer: Self-pay | Admitting: Cardiology

## 2021-02-13 DIAGNOSIS — I10 Essential (primary) hypertension: Secondary | ICD-10-CM | POA: Diagnosis not present

## 2021-02-13 DIAGNOSIS — Z515 Encounter for palliative care: Secondary | ICD-10-CM | POA: Diagnosis not present

## 2021-02-13 DIAGNOSIS — R109 Unspecified abdominal pain: Secondary | ICD-10-CM | POA: Diagnosis not present

## 2021-02-13 DIAGNOSIS — F32A Depression, unspecified: Secondary | ICD-10-CM | POA: Diagnosis not present

## 2021-02-13 DIAGNOSIS — I509 Heart failure, unspecified: Secondary | ICD-10-CM | POA: Diagnosis not present

## 2021-02-13 DIAGNOSIS — E119 Type 2 diabetes mellitus without complications: Secondary | ICD-10-CM | POA: Diagnosis not present

## 2021-02-13 DIAGNOSIS — G4733 Obstructive sleep apnea (adult) (pediatric): Secondary | ICD-10-CM | POA: Diagnosis not present

## 2021-02-14 DIAGNOSIS — F32A Depression, unspecified: Secondary | ICD-10-CM | POA: Diagnosis not present

## 2021-02-14 DIAGNOSIS — R109 Unspecified abdominal pain: Secondary | ICD-10-CM | POA: Diagnosis not present

## 2021-02-14 DIAGNOSIS — Z515 Encounter for palliative care: Secondary | ICD-10-CM | POA: Diagnosis not present

## 2021-02-14 DIAGNOSIS — I509 Heart failure, unspecified: Secondary | ICD-10-CM | POA: Diagnosis not present

## 2021-02-14 DIAGNOSIS — G4733 Obstructive sleep apnea (adult) (pediatric): Secondary | ICD-10-CM | POA: Diagnosis not present

## 2021-02-14 DIAGNOSIS — I10 Essential (primary) hypertension: Secondary | ICD-10-CM | POA: Diagnosis not present

## 2021-02-14 DIAGNOSIS — E119 Type 2 diabetes mellitus without complications: Secondary | ICD-10-CM | POA: Diagnosis not present

## 2021-02-15 DIAGNOSIS — I10 Essential (primary) hypertension: Secondary | ICD-10-CM | POA: Diagnosis not present

## 2021-02-15 DIAGNOSIS — I509 Heart failure, unspecified: Secondary | ICD-10-CM | POA: Diagnosis not present

## 2021-02-15 DIAGNOSIS — G4733 Obstructive sleep apnea (adult) (pediatric): Secondary | ICD-10-CM | POA: Diagnosis not present

## 2021-02-15 DIAGNOSIS — F32A Depression, unspecified: Secondary | ICD-10-CM | POA: Diagnosis not present

## 2021-02-15 DIAGNOSIS — Z515 Encounter for palliative care: Secondary | ICD-10-CM | POA: Diagnosis not present

## 2021-02-15 DIAGNOSIS — R109 Unspecified abdominal pain: Secondary | ICD-10-CM | POA: Diagnosis not present

## 2021-02-15 DIAGNOSIS — E119 Type 2 diabetes mellitus without complications: Secondary | ICD-10-CM | POA: Diagnosis not present

## 2021-02-16 DIAGNOSIS — G4733 Obstructive sleep apnea (adult) (pediatric): Secondary | ICD-10-CM | POA: Diagnosis not present

## 2021-02-16 DIAGNOSIS — I509 Heart failure, unspecified: Secondary | ICD-10-CM | POA: Diagnosis not present

## 2021-02-16 DIAGNOSIS — E119 Type 2 diabetes mellitus without complications: Secondary | ICD-10-CM | POA: Diagnosis not present

## 2021-02-16 DIAGNOSIS — R109 Unspecified abdominal pain: Secondary | ICD-10-CM | POA: Diagnosis not present

## 2021-02-16 DIAGNOSIS — I10 Essential (primary) hypertension: Secondary | ICD-10-CM | POA: Diagnosis not present

## 2021-02-16 DIAGNOSIS — Z515 Encounter for palliative care: Secondary | ICD-10-CM | POA: Diagnosis not present

## 2021-02-16 DIAGNOSIS — F32A Depression, unspecified: Secondary | ICD-10-CM | POA: Diagnosis not present

## 2021-02-17 DIAGNOSIS — I10 Essential (primary) hypertension: Secondary | ICD-10-CM | POA: Diagnosis not present

## 2021-02-17 DIAGNOSIS — Z515 Encounter for palliative care: Secondary | ICD-10-CM | POA: Diagnosis not present

## 2021-02-17 DIAGNOSIS — R109 Unspecified abdominal pain: Secondary | ICD-10-CM | POA: Diagnosis not present

## 2021-02-17 DIAGNOSIS — E119 Type 2 diabetes mellitus without complications: Secondary | ICD-10-CM | POA: Diagnosis not present

## 2021-02-17 DIAGNOSIS — F32A Depression, unspecified: Secondary | ICD-10-CM | POA: Diagnosis not present

## 2021-02-17 DIAGNOSIS — I509 Heart failure, unspecified: Secondary | ICD-10-CM | POA: Diagnosis not present

## 2021-02-17 DIAGNOSIS — G4733 Obstructive sleep apnea (adult) (pediatric): Secondary | ICD-10-CM | POA: Diagnosis not present

## 2021-02-18 ENCOUNTER — Encounter: Payer: Self-pay | Admitting: Internal Medicine

## 2021-02-18 ENCOUNTER — Emergency Department (HOSPITAL_COMMUNITY)
Admission: RE | Admit: 2021-02-18 | Discharge: 2021-02-18 | Disposition: A | Discharge status: Home / Self Care | Payer: Medicaid Other | Source: Ambulatory Visit | Attending: Internal Medicine | Admitting: Internal Medicine

## 2021-02-18 ENCOUNTER — Ambulatory Visit (INDEPENDENT_AMBULATORY_CARE_PROVIDER_SITE_OTHER): Payer: Medicaid Other | Admitting: Internal Medicine

## 2021-02-18 ENCOUNTER — Emergency Department (HOSPITAL_COMMUNITY)
Admission: EM | Admit: 2021-02-18 | Discharge: 2021-02-18 | Disposition: A | Discharge status: Home / Self Care | Payer: Medicaid Other | Attending: Internal Medicine | Admitting: Internal Medicine

## 2021-02-18 ENCOUNTER — Other Ambulatory Visit: Payer: Self-pay

## 2021-02-18 ENCOUNTER — Encounter (HOSPITAL_COMMUNITY): Payer: Self-pay

## 2021-02-18 ENCOUNTER — Ambulatory Visit (HOSPITAL_COMMUNITY): Admission: RE | Admit: 2021-02-18 | Payer: Medicaid Other | Source: Ambulatory Visit

## 2021-02-18 VITALS — BP 107/83 | HR 107 | Temp 98.1°F | Ht 74.0 in | Wt 350.5 lb

## 2021-02-18 DIAGNOSIS — F32A Depression, unspecified: Secondary | ICD-10-CM | POA: Diagnosis not present

## 2021-02-18 DIAGNOSIS — M25512 Pain in left shoulder: Secondary | ICD-10-CM | POA: Diagnosis not present

## 2021-02-18 DIAGNOSIS — Z515 Encounter for palliative care: Secondary | ICD-10-CM | POA: Diagnosis not present

## 2021-02-18 DIAGNOSIS — E669 Obesity, unspecified: Secondary | ICD-10-CM | POA: Diagnosis not present

## 2021-02-18 DIAGNOSIS — J341 Cyst and mucocele of nose and nasal sinus: Secondary | ICD-10-CM | POA: Diagnosis not present

## 2021-02-18 DIAGNOSIS — R221 Localized swelling, mass and lump, neck: Secondary | ICD-10-CM | POA: Diagnosis not present

## 2021-02-18 DIAGNOSIS — I10 Essential (primary) hypertension: Secondary | ICD-10-CM | POA: Diagnosis not present

## 2021-02-18 DIAGNOSIS — Z5321 Procedure and treatment not carried out due to patient leaving prior to being seen by health care provider: Secondary | ICD-10-CM | POA: Diagnosis not present

## 2021-02-18 DIAGNOSIS — G4733 Obstructive sleep apnea (adult) (pediatric): Secondary | ICD-10-CM | POA: Diagnosis not present

## 2021-02-18 DIAGNOSIS — I509 Heart failure, unspecified: Secondary | ICD-10-CM | POA: Diagnosis not present

## 2021-02-18 DIAGNOSIS — M542 Cervicalgia: Secondary | ICD-10-CM

## 2021-02-18 DIAGNOSIS — E1169 Type 2 diabetes mellitus with other specified complication: Secondary | ICD-10-CM

## 2021-02-18 DIAGNOSIS — E119 Type 2 diabetes mellitus without complications: Secondary | ICD-10-CM | POA: Diagnosis not present

## 2021-02-18 DIAGNOSIS — R109 Unspecified abdominal pain: Secondary | ICD-10-CM | POA: Diagnosis not present

## 2021-02-18 LAB — GLUCOSE, CAPILLARY: Glucose-Capillary: 189 mg/dL — ABNORMAL HIGH (ref 70–99)

## 2021-02-18 LAB — POCT GLYCOSYLATED HEMOGLOBIN (HGB A1C): Hemoglobin A1C: 8.3 % — AB (ref 4.0–5.6)

## 2021-02-18 MED ORDER — TRAMADOL HCL 50 MG PO TABS: 50.0000 mg | ORAL_TABLET | Freq: Four times a day (QID) | ORAL | 0 refills | Status: DC | PRN

## 2021-02-18 MED ORDER — IOHEXOL 350 MG/ML SOLN
75.0000 mL | Freq: Once | INTRAVENOUS | Status: AC | PRN
  Administered 2021-02-18: 75 mL via INTRAVENOUS

## 2021-02-18 MED ORDER — CEFTRIAXONE SODIUM 1 G IJ SOLR
1.0000 g | Freq: Once | INTRAMUSCULAR | Status: AC
  Administered 2021-02-18: 1 g via INTRAMUSCULAR

## 2021-02-18 NOTE — Progress Notes (Signed)
CC: L Neck, Shoulder, clavicle pain  HPI:  [name] is a 36 y.o. person, with a PMH noted below, who presents to the clinic shoulder and neck pain. To see the management of their acute and chronic conditions, please see the A&P note under the Encounters tab.   Past Medical History:  Diagnosis Date   Acute pain of left foot 05/03/2017   Asthma    Asthma, chronic, mild persistent, uncomplicated 10/26/2016   Charcot ankle, left 05/31/2017   CHF (congestive heart failure) (HCC)    Chronic systolic (congestive) heart failure (HCC)    Diabetes mellitus type 2 in obese (HCC) 04/21/2014   Diabetic polyneuropathy associated with type 2 diabetes mellitus (HCC) 03/14/2017   Essential hypertension 10/26/2016   Hypertension    Obesity    Obesity, Class III, BMI 40-49.9 (morbid obesity) (HCC) 10/26/2016   OSA (obstructive sleep apnea) 12/02/2016   Type 2 diabetes mellitus with diabetic neuropathy (HCC)    Review of Systems:   Review of Systems  Constitutional:  Negative for chills, fever and weight loss.  HENT:  Negative for ear pain, sinus pain and sore throat.   Eyes:  Negative for blurred vision and double vision.  Cardiovascular:  Negative for chest pain.  Gastrointestinal:  Negative for nausea and vomiting.  Musculoskeletal:  Positive for neck pain. Negative for back pain, falls, joint pain and myalgias.  Skin:  Negative for rash.    Physical Exam:  Vitals:   02/18/21 1546  BP: 107/83  Pulse: (!) 107  Temp: 98.1 F (36.7 C)  TempSrc: Oral  SpO2: 100%  Weight: (!) 350 lb 8 oz (159 kg)  Height: 6\' 2"  (1.88 m)   Physical Exam Constitutional:      General: He is not in acute distress.    Appearance: He is obese. He is not ill-appearing, toxic-appearing or diaphoretic.     Comments: Alert, appears uncomfortable,   HENT:     Head: Normocephalic and atraumatic.     Right Ear: External ear normal.     Left Ear: External ear normal.     Nose: Nose normal. No  congestion.  Neck:     Vascular: No carotid bruit.  Cardiovascular:     Rate and Rhythm: Regular rhythm. Tachycardia present.     Pulses: Normal pulses.     Heart sounds: Normal heart sounds. No murmur heard.   No friction rub. No gallop.  Pulmonary:     Effort: Pulmonary effort is normal. No respiratory distress.     Breath sounds: Normal breath sounds. No wheezing.  Abdominal:     General: Bowel sounds are normal.     Palpations: Abdomen is soft.  Musculoskeletal:     Cervical back: Tenderness present. No rigidity.  Skin:    General: Skin is warm and dry.     Coloration: Skin is not jaundiced or pale.     Findings: Erythema present. No bruising, lesion or rash.     Comments: Diffuse tenderness and erythema to the left anterolateral  neck and shoulder, no palpable lymphadenopathy. No fluctuance or drainage appreciated.  R sided PICC intact with no erythema, purulence or drainage.   Neurological:     Mental Status: He is alert and oriented to person, place, and time.  Psychiatric:        Mood and Affect: Mood normal.        Behavior: Behavior normal.     Assessment & Plan:   See Encounters Tab for problem  based charting.  Patient discussed with Dr. Mayford Knife

## 2021-02-18 NOTE — ED Triage Notes (Signed)
Just left internal medicine and was sent to ER for left shoulder CT

## 2021-02-18 NOTE — Progress Notes (Signed)
83 ° °

## 2021-02-18 NOTE — Assessment & Plan Note (Addendum)
Patient presents to the clinic with neck pain, swelling, and tightness since 02/14/22. He denies any changes in routine, lifting heavy objects, or falls. He states that the pain is currently an 8/10 and is localized to the front of the neck to the left side. He has tried tylenol and NSAIDs with little relief. He denies any fevers, diaphoresis, or systemic symptoms. He endorses lack of sleep secondary to his neck pain.   A/P:  Patient presents to the clinic with 5 days of anterolateral neck pain. On physical examination, he is diffusely tender on the L anterolateral aspect of his neck, he does have some swelling of the suprasternal notch where the pain is most severe. We discussed that his symptoms may be secondary to infectious etiology such as cellulitis, but the location is not typical. Thrombosis was also discussed as the patient has a PICC line, but this is unusual as well given that the PICC is on the R side. Other etiologies could be thyroiditis, but we were unable to collect these labs. The patient does have several instances of leaving the ED AMA or before workups. I stressed that it would be important to wait for his CT, which he understands. I was able to speak with radiology who was able to assist in scheduling the patient today, which is greatly appreciated. I additionally spoke with the patient about his scheduled CT and he was will present to the ED waiting room for his scan.  - CBC w/ diff - CT neck w contrast - Tramadol 50 mg Q6H PRN  - Rocephin 1g IM   Addendum:  Spoke with patient about results and imaging, will have close follow up in clinic.

## 2021-02-18 NOTE — ED Provider Triage Note (Signed)
Emergency Medicine Provider Triage Evaluation Note  Jonathan [name] , a 36 y.o. male  was evaluated in triage.  Pt complains of patient sent by PCP, Dr. Gilford Rile, for CT neck with contrast to rule out infection. He admits to left-sided neck pain and swelling for the past few days. No difficulties swallowing or shortness of breath. Denies fever and chills.   Review of Systems  Positive: Neck pain Negative: SOB  Physical Exam  BP (!) 138/100 (BP Location: Left Wrist)    Pulse (!) 110    Temp 99.3 F (37.4 C) (Oral)    Resp 20    Ht 6\' 2"  (1.88 m)    Wt (!) 158.8 kg    SpO2 97%    BMI 44.94 kg/m  Gen:   Awake, no distress   Resp:  Normal effort  MSK:   Moves extremities without difficulty  Other:    Medical Decision Making  Medically screening exam initiated at 6:04 PM.  Appropriate orders placed.  Jonathan [name] was informed that the remainder of the evaluation will be completed by another provider, this initial triage assessment does not replace that evaluation, and the importance of remaining in the ED until their evaluation is complete.  Concern for sepsis secondary to neck infection. I had a long discussion with patient the need for labs, most specifically BMP to check renal function prior to CT with contrast and patient declined all labs and CT scan. He prefers to leave and reschedule his scan in the outpatient setting.    Suzy Bouchard, [place] 02/18/21 1809

## 2021-02-18 NOTE — Assessment & Plan Note (Signed)
Patient's A1c is 8.3 today, we will continue his current regimen and follow up in 3 months.  - Continue current regimen

## 2021-02-18 NOTE — ED Provider Notes (Signed)
Spoke to CT tech, patient was suppose to get CT scan as outpatient; however reported to the ED instead. Patient received scan as directed by his PCP.    Karie Kirks 02/18/21 Doretha Imus, MD 02/22/21 1757

## 2021-02-18 NOTE — Patient Instructions (Signed)
To Mr. Palmero,   It was a pleasure seeing you again. I am sorry that you are having shoulder and neck pain. Today we are going to collect some blood for signs of infection. Given the location we are going to have you get a CT scan of your neck, and have given you a dose of IV antibiotic. We will call you with the results. We will have you follow back in 5 days. We will call you if you need to be seen sooner, or to present to the ED.  Have a good day,  Dolan Amen, MD

## 2021-02-19 ENCOUNTER — Encounter: Payer: Self-pay | Admitting: Internal Medicine

## 2021-02-19 DIAGNOSIS — I10 Essential (primary) hypertension: Secondary | ICD-10-CM | POA: Diagnosis not present

## 2021-02-19 DIAGNOSIS — E119 Type 2 diabetes mellitus without complications: Secondary | ICD-10-CM | POA: Diagnosis not present

## 2021-02-19 DIAGNOSIS — G4733 Obstructive sleep apnea (adult) (pediatric): Secondary | ICD-10-CM | POA: Diagnosis not present

## 2021-02-19 DIAGNOSIS — F32A Depression, unspecified: Secondary | ICD-10-CM | POA: Diagnosis not present

## 2021-02-19 DIAGNOSIS — Z515 Encounter for palliative care: Secondary | ICD-10-CM | POA: Diagnosis not present

## 2021-02-19 DIAGNOSIS — I509 Heart failure, unspecified: Secondary | ICD-10-CM | POA: Diagnosis not present

## 2021-02-19 DIAGNOSIS — R109 Unspecified abdominal pain: Secondary | ICD-10-CM | POA: Diagnosis not present

## 2021-02-19 LAB — CBC WITH DIFFERENTIAL/PLATELET
Basophils Absolute: 0 10*3/uL (ref 0.0–0.2)
Basos: 0 %
EOS (ABSOLUTE): 0.1 10*3/uL (ref 0.0–0.4)
Eos: 1 %
Hematocrit: 36.5 % — ABNORMAL LOW (ref 37.5–51.0)
Hemoglobin: 12.6 g/dL — ABNORMAL LOW (ref 13.0–17.7)
Immature Grans (Abs): 0 10*3/uL (ref 0.0–0.1)
Immature Granulocytes: 0 %
Lymphocytes Absolute: 1.8 10*3/uL (ref 0.7–3.1)
Lymphs: 20 %
MCH: 30.7 pg (ref 26.6–33.0)
MCHC: 34.5 g/dL (ref 31.5–35.7)
MCV: 89 fL (ref 79–97)
Monocytes Absolute: 1 10*3/uL — ABNORMAL HIGH (ref 0.1–0.9)
Monocytes: 11 %
Neutrophils Absolute: 6 10*3/uL (ref 1.4–7.0)
Neutrophils: 68 %
Platelets: 359 10*3/uL (ref 150–450)
RBC: 4.11 x10E6/uL — ABNORMAL LOW (ref 4.14–5.80)
RDW: 13.5 % (ref 11.6–15.4)
WBC: 8.9 10*3/uL (ref 3.4–10.8)

## 2021-02-20 ENCOUNTER — Encounter (HOSPITAL_COMMUNITY): Payer: Self-pay | Admitting: *Deleted

## 2021-02-20 DIAGNOSIS — Z515 Encounter for palliative care: Secondary | ICD-10-CM | POA: Diagnosis not present

## 2021-02-20 DIAGNOSIS — I10 Essential (primary) hypertension: Secondary | ICD-10-CM | POA: Diagnosis not present

## 2021-02-20 DIAGNOSIS — R109 Unspecified abdominal pain: Secondary | ICD-10-CM | POA: Diagnosis not present

## 2021-02-20 DIAGNOSIS — G4733 Obstructive sleep apnea (adult) (pediatric): Secondary | ICD-10-CM | POA: Diagnosis not present

## 2021-02-20 DIAGNOSIS — E119 Type 2 diabetes mellitus without complications: Secondary | ICD-10-CM | POA: Diagnosis not present

## 2021-02-20 DIAGNOSIS — F32A Depression, unspecified: Secondary | ICD-10-CM | POA: Diagnosis not present

## 2021-02-20 DIAGNOSIS — I509 Heart failure, unspecified: Secondary | ICD-10-CM | POA: Diagnosis not present

## 2021-02-20 NOTE — Progress Notes (Signed)
Pt's wife FMLA forms for intermittent leave completed, signed by Dr Gala Romney, and faxed into [employer], she is aware this has been done

## 2021-02-21 DIAGNOSIS — I10 Essential (primary) hypertension: Secondary | ICD-10-CM | POA: Diagnosis not present

## 2021-02-21 DIAGNOSIS — I509 Heart failure, unspecified: Secondary | ICD-10-CM | POA: Diagnosis not present

## 2021-02-21 DIAGNOSIS — E1169 Type 2 diabetes mellitus with other specified complication: Secondary | ICD-10-CM | POA: Diagnosis not present

## 2021-02-21 DIAGNOSIS — E119 Type 2 diabetes mellitus without complications: Secondary | ICD-10-CM | POA: Diagnosis not present

## 2021-02-21 DIAGNOSIS — G4733 Obstructive sleep apnea (adult) (pediatric): Secondary | ICD-10-CM | POA: Diagnosis not present

## 2021-02-21 DIAGNOSIS — R109 Unspecified abdominal pain: Secondary | ICD-10-CM | POA: Diagnosis not present

## 2021-02-21 DIAGNOSIS — Z515 Encounter for palliative care: Secondary | ICD-10-CM | POA: Diagnosis not present

## 2021-02-21 DIAGNOSIS — F32A Depression, unspecified: Secondary | ICD-10-CM | POA: Diagnosis not present

## 2021-02-21 NOTE — Progress Notes (Signed)
Internal Medicine Clinic Attending  I saw and evaluated the patient.  I personally confirmed the key portions of the history and exam documented by Dr. Sande Brothers and I reviewed pertinent patient test results.  The assessment, diagnosis, and plan were formulated together and I agree with the documentation in the residents note. CT unrevealing.  Possibility of thyroiditis (acute infectious or subacute, each of which can cause pain) though this was not considered until after he had left the appt; he is returning for close f/u and thyroid levels will be assessed.  This is an atraumatic pain, not c/w an MSK etiology, and skin findings (though tenderness present) are not c/w a cellulitis.  Perplexing.

## 2021-02-22 DIAGNOSIS — I10 Essential (primary) hypertension: Secondary | ICD-10-CM | POA: Diagnosis not present

## 2021-02-22 DIAGNOSIS — I509 Heart failure, unspecified: Secondary | ICD-10-CM | POA: Diagnosis not present

## 2021-02-22 DIAGNOSIS — E119 Type 2 diabetes mellitus without complications: Secondary | ICD-10-CM | POA: Diagnosis not present

## 2021-02-22 DIAGNOSIS — Z515 Encounter for palliative care: Secondary | ICD-10-CM | POA: Diagnosis not present

## 2021-02-22 DIAGNOSIS — F32A Depression, unspecified: Secondary | ICD-10-CM | POA: Diagnosis not present

## 2021-02-22 DIAGNOSIS — G4733 Obstructive sleep apnea (adult) (pediatric): Secondary | ICD-10-CM | POA: Diagnosis not present

## 2021-02-22 DIAGNOSIS — R109 Unspecified abdominal pain: Secondary | ICD-10-CM | POA: Diagnosis not present

## 2021-02-23 DIAGNOSIS — E119 Type 2 diabetes mellitus without complications: Secondary | ICD-10-CM | POA: Diagnosis not present

## 2021-02-23 DIAGNOSIS — R109 Unspecified abdominal pain: Secondary | ICD-10-CM | POA: Diagnosis not present

## 2021-02-23 DIAGNOSIS — Z515 Encounter for palliative care: Secondary | ICD-10-CM | POA: Diagnosis not present

## 2021-02-23 DIAGNOSIS — I10 Essential (primary) hypertension: Secondary | ICD-10-CM | POA: Diagnosis not present

## 2021-02-23 DIAGNOSIS — G4733 Obstructive sleep apnea (adult) (pediatric): Secondary | ICD-10-CM | POA: Diagnosis not present

## 2021-02-23 DIAGNOSIS — I509 Heart failure, unspecified: Secondary | ICD-10-CM | POA: Diagnosis not present

## 2021-02-23 DIAGNOSIS — F32A Depression, unspecified: Secondary | ICD-10-CM | POA: Diagnosis not present

## 2021-02-24 DIAGNOSIS — F32A Depression, unspecified: Secondary | ICD-10-CM | POA: Diagnosis not present

## 2021-02-24 DIAGNOSIS — Z515 Encounter for palliative care: Secondary | ICD-10-CM | POA: Diagnosis not present

## 2021-02-24 DIAGNOSIS — I10 Essential (primary) hypertension: Secondary | ICD-10-CM | POA: Diagnosis not present

## 2021-02-24 DIAGNOSIS — E119 Type 2 diabetes mellitus without complications: Secondary | ICD-10-CM | POA: Diagnosis not present

## 2021-02-24 DIAGNOSIS — I509 Heart failure, unspecified: Secondary | ICD-10-CM | POA: Diagnosis not present

## 2021-02-24 DIAGNOSIS — R109 Unspecified abdominal pain: Secondary | ICD-10-CM | POA: Diagnosis not present

## 2021-02-24 DIAGNOSIS — G4733 Obstructive sleep apnea (adult) (pediatric): Secondary | ICD-10-CM | POA: Diagnosis not present

## 2021-02-25 ENCOUNTER — Other Ambulatory Visit (HOSPITAL_COMMUNITY): Payer: Self-pay | Admitting: Cardiology

## 2021-02-25 ENCOUNTER — Ambulatory Visit (INDEPENDENT_AMBULATORY_CARE_PROVIDER_SITE_OTHER): Payer: Medicaid Other | Admitting: Internal Medicine

## 2021-02-25 DIAGNOSIS — M542 Cervicalgia: Secondary | ICD-10-CM

## 2021-02-25 DIAGNOSIS — G4733 Obstructive sleep apnea (adult) (pediatric): Secondary | ICD-10-CM | POA: Diagnosis not present

## 2021-02-25 DIAGNOSIS — I10 Essential (primary) hypertension: Secondary | ICD-10-CM | POA: Diagnosis not present

## 2021-02-25 DIAGNOSIS — I509 Heart failure, unspecified: Secondary | ICD-10-CM | POA: Diagnosis not present

## 2021-02-25 DIAGNOSIS — R109 Unspecified abdominal pain: Secondary | ICD-10-CM | POA: Diagnosis not present

## 2021-02-25 DIAGNOSIS — F32A Depression, unspecified: Secondary | ICD-10-CM | POA: Diagnosis not present

## 2021-02-25 DIAGNOSIS — Z515 Encounter for palliative care: Secondary | ICD-10-CM | POA: Diagnosis not present

## 2021-02-25 DIAGNOSIS — E119 Type 2 diabetes mellitus without complications: Secondary | ICD-10-CM | POA: Diagnosis not present

## 2021-02-26 DIAGNOSIS — I10 Essential (primary) hypertension: Secondary | ICD-10-CM | POA: Diagnosis not present

## 2021-02-26 DIAGNOSIS — F32A Depression, unspecified: Secondary | ICD-10-CM | POA: Diagnosis not present

## 2021-02-26 DIAGNOSIS — I509 Heart failure, unspecified: Secondary | ICD-10-CM | POA: Diagnosis not present

## 2021-02-26 DIAGNOSIS — R109 Unspecified abdominal pain: Secondary | ICD-10-CM | POA: Diagnosis not present

## 2021-02-26 DIAGNOSIS — E119 Type 2 diabetes mellitus without complications: Secondary | ICD-10-CM | POA: Diagnosis not present

## 2021-02-26 DIAGNOSIS — G4733 Obstructive sleep apnea (adult) (pediatric): Secondary | ICD-10-CM | POA: Diagnosis not present

## 2021-02-26 DIAGNOSIS — Z515 Encounter for palliative care: Secondary | ICD-10-CM | POA: Diagnosis not present

## 2021-02-27 DIAGNOSIS — I509 Heart failure, unspecified: Secondary | ICD-10-CM | POA: Diagnosis not present

## 2021-02-27 DIAGNOSIS — R109 Unspecified abdominal pain: Secondary | ICD-10-CM | POA: Diagnosis not present

## 2021-02-27 DIAGNOSIS — Z515 Encounter for palliative care: Secondary | ICD-10-CM | POA: Diagnosis not present

## 2021-02-27 DIAGNOSIS — I10 Essential (primary) hypertension: Secondary | ICD-10-CM | POA: Diagnosis not present

## 2021-02-27 DIAGNOSIS — F32A Depression, unspecified: Secondary | ICD-10-CM | POA: Diagnosis not present

## 2021-02-27 DIAGNOSIS — G4733 Obstructive sleep apnea (adult) (pediatric): Secondary | ICD-10-CM | POA: Diagnosis not present

## 2021-02-27 DIAGNOSIS — E119 Type 2 diabetes mellitus without complications: Secondary | ICD-10-CM | POA: Diagnosis not present

## 2021-02-27 NOTE — Progress Notes (Signed)
°  [facility] Internal Medicine Residency Telephone Encounter Continuity Care Appointment  HPI:  This telephone encounter was created for [name] on 02/27/2021 for the following purpose/cc neck pain.   Past Medical History:  Past Medical History:  Diagnosis Date   Acute pain of left foot 05/03/2017   Asthma    Asthma, chronic, mild persistent, uncomplicated [id number]   Charcot ankle, left 05/31/2017   CHF (congestive heart failure) (HCC)    Chronic systolic (congestive) heart failure ([place])    Diabetes mellitus type 2 in obese ([place]) 04/21/2014   Diabetic polyneuropathy associated with type 2 diabetes mellitus ([place]) 03/14/2017   Essential hypertension 10/26/2016   Hypertension    Obesity    Obesity, Class III, BMI 40-49.9 (morbid obesity) ([place]) 10/26/2016   OSA (obstructive sleep apnea) 12/02/2016   Type 2 diabetes mellitus with diabetic neuropathy (HCC)      ROS:  Review of Systems  Constitutional:  Negative for chills, fever, malaise/fatigue and weight loss.  Eyes:  Negative for blurred vision, double vision and photophobia.  Respiratory:  Negative for cough, hemoptysis and sputum production.   Cardiovascular:  Negative for chest pain, palpitations and orthopnea.  Gastrointestinal:  Negative for abdominal pain, diarrhea, nausea and vomiting.  Musculoskeletal:  Positive for neck pain. Negative for back pain, falls and joint pain.  Skin:  Negative for itching and rash.  Neurological:  Negative for dizziness and headaches.     Assessment / Plan / Recommendations:  Please see A&P under problem oriented charting for assessment of the patient's acute and chronic medical conditions.  As always, pt is advised that if symptoms worsen or new symptoms arise, they should go to an urgent care facility or to to ER for further evaluation.   Consent and Medical Decision Making:  Patient discussed with Dr. Jimmye Norman This is a telephone encounter between [facility]  and [name] on 02/27/2021 for neck pain. The visit was conducted with the patient located at home and [name] at [facility]. The patient's identity was confirmed using their DOB and current address. The patient has consented to being evaluated through a telephone encounter and understands the associated risks (an examination cannot be done and the patient may need to come in for an appointment) / benefits (allows the patient to remain at home, decreasing exposure to coronavirus). I personally spent 14 minutes on medical discussion.

## 2021-02-27 NOTE — Assessment & Plan Note (Signed)
Patient presents for a telehealth visit for follow up on his neck pain. Jonathan [name] states that his pain scale is a 6/10 today. He denies any fevers, vomiting, nausea, vision changes, headaches, or difficulty breathing. He states that his Ultram helps alleviate the pain. We rediscussed his negative labs and imaging, which is reassuring. We were going to collect a TSH and free T4 to rule out thyroiditis, given a negative work up so far. Jonathan [name] is hesitant to come in for a lab only visit, but does state that he has a follow up with the heart failure team this month and would like to have labs taken then. I will reach out to his HF team's office.  - TSH and free T4 at next visit

## 2021-02-28 DIAGNOSIS — F32A Depression, unspecified: Secondary | ICD-10-CM | POA: Diagnosis not present

## 2021-02-28 DIAGNOSIS — I509 Heart failure, unspecified: Secondary | ICD-10-CM | POA: Diagnosis not present

## 2021-02-28 DIAGNOSIS — Z515 Encounter for palliative care: Secondary | ICD-10-CM | POA: Diagnosis not present

## 2021-02-28 DIAGNOSIS — G4733 Obstructive sleep apnea (adult) (pediatric): Secondary | ICD-10-CM | POA: Diagnosis not present

## 2021-02-28 DIAGNOSIS — I10 Essential (primary) hypertension: Secondary | ICD-10-CM | POA: Diagnosis not present

## 2021-02-28 DIAGNOSIS — E119 Type 2 diabetes mellitus without complications: Secondary | ICD-10-CM | POA: Diagnosis not present

## 2021-02-28 DIAGNOSIS — R109 Unspecified abdominal pain: Secondary | ICD-10-CM | POA: Diagnosis not present

## 2021-03-01 DIAGNOSIS — I10 Essential (primary) hypertension: Secondary | ICD-10-CM | POA: Diagnosis not present

## 2021-03-01 DIAGNOSIS — E119 Type 2 diabetes mellitus without complications: Secondary | ICD-10-CM | POA: Diagnosis not present

## 2021-03-01 DIAGNOSIS — Z515 Encounter for palliative care: Secondary | ICD-10-CM | POA: Diagnosis not present

## 2021-03-01 DIAGNOSIS — F32A Depression, unspecified: Secondary | ICD-10-CM | POA: Diagnosis not present

## 2021-03-01 DIAGNOSIS — G4733 Obstructive sleep apnea (adult) (pediatric): Secondary | ICD-10-CM | POA: Diagnosis not present

## 2021-03-01 DIAGNOSIS — R109 Unspecified abdominal pain: Secondary | ICD-10-CM | POA: Diagnosis not present

## 2021-03-01 DIAGNOSIS — I509 Heart failure, unspecified: Secondary | ICD-10-CM | POA: Diagnosis not present

## 2021-03-02 DIAGNOSIS — I10 Essential (primary) hypertension: Secondary | ICD-10-CM | POA: Diagnosis not present

## 2021-03-02 DIAGNOSIS — F32A Depression, unspecified: Secondary | ICD-10-CM | POA: Diagnosis not present

## 2021-03-02 DIAGNOSIS — E119 Type 2 diabetes mellitus without complications: Secondary | ICD-10-CM | POA: Diagnosis not present

## 2021-03-02 DIAGNOSIS — I509 Heart failure, unspecified: Secondary | ICD-10-CM | POA: Diagnosis not present

## 2021-03-02 DIAGNOSIS — G4733 Obstructive sleep apnea (adult) (pediatric): Secondary | ICD-10-CM | POA: Diagnosis not present

## 2021-03-02 DIAGNOSIS — R109 Unspecified abdominal pain: Secondary | ICD-10-CM | POA: Diagnosis not present

## 2021-03-02 DIAGNOSIS — Z515 Encounter for palliative care: Secondary | ICD-10-CM | POA: Diagnosis not present

## 2021-03-03 DIAGNOSIS — F32A Depression, unspecified: Secondary | ICD-10-CM | POA: Diagnosis not present

## 2021-03-03 DIAGNOSIS — E119 Type 2 diabetes mellitus without complications: Secondary | ICD-10-CM | POA: Diagnosis not present

## 2021-03-03 DIAGNOSIS — Z515 Encounter for palliative care: Secondary | ICD-10-CM | POA: Diagnosis not present

## 2021-03-03 DIAGNOSIS — R109 Unspecified abdominal pain: Secondary | ICD-10-CM | POA: Diagnosis not present

## 2021-03-03 DIAGNOSIS — G4733 Obstructive sleep apnea (adult) (pediatric): Secondary | ICD-10-CM | POA: Diagnosis not present

## 2021-03-03 DIAGNOSIS — I10 Essential (primary) hypertension: Secondary | ICD-10-CM | POA: Diagnosis not present

## 2021-03-03 DIAGNOSIS — I509 Heart failure, unspecified: Secondary | ICD-10-CM | POA: Diagnosis not present

## 2021-03-04 DIAGNOSIS — E119 Type 2 diabetes mellitus without complications: Secondary | ICD-10-CM | POA: Diagnosis not present

## 2021-03-04 DIAGNOSIS — F32A Depression, unspecified: Secondary | ICD-10-CM | POA: Diagnosis not present

## 2021-03-04 DIAGNOSIS — Z515 Encounter for palliative care: Secondary | ICD-10-CM | POA: Diagnosis not present

## 2021-03-04 DIAGNOSIS — R109 Unspecified abdominal pain: Secondary | ICD-10-CM | POA: Diagnosis not present

## 2021-03-04 DIAGNOSIS — I509 Heart failure, unspecified: Secondary | ICD-10-CM | POA: Diagnosis not present

## 2021-03-04 DIAGNOSIS — G4733 Obstructive sleep apnea (adult) (pediatric): Secondary | ICD-10-CM | POA: Diagnosis not present

## 2021-03-04 DIAGNOSIS — I10 Essential (primary) hypertension: Secondary | ICD-10-CM | POA: Diagnosis not present

## 2021-03-05 DIAGNOSIS — G4733 Obstructive sleep apnea (adult) (pediatric): Secondary | ICD-10-CM | POA: Diagnosis not present

## 2021-03-05 DIAGNOSIS — I509 Heart failure, unspecified: Secondary | ICD-10-CM | POA: Diagnosis not present

## 2021-03-05 DIAGNOSIS — Z515 Encounter for palliative care: Secondary | ICD-10-CM | POA: Diagnosis not present

## 2021-03-05 DIAGNOSIS — E119 Type 2 diabetes mellitus without complications: Secondary | ICD-10-CM | POA: Diagnosis not present

## 2021-03-05 DIAGNOSIS — F32A Depression, unspecified: Secondary | ICD-10-CM | POA: Diagnosis not present

## 2021-03-05 DIAGNOSIS — I10 Essential (primary) hypertension: Secondary | ICD-10-CM | POA: Diagnosis not present

## 2021-03-05 DIAGNOSIS — R109 Unspecified abdominal pain: Secondary | ICD-10-CM | POA: Diagnosis not present

## 2021-03-06 DIAGNOSIS — Z515 Encounter for palliative care: Secondary | ICD-10-CM | POA: Diagnosis not present

## 2021-03-06 DIAGNOSIS — G4733 Obstructive sleep apnea (adult) (pediatric): Secondary | ICD-10-CM | POA: Diagnosis not present

## 2021-03-06 DIAGNOSIS — F32A Depression, unspecified: Secondary | ICD-10-CM | POA: Diagnosis not present

## 2021-03-06 DIAGNOSIS — I509 Heart failure, unspecified: Secondary | ICD-10-CM | POA: Diagnosis not present

## 2021-03-06 DIAGNOSIS — R109 Unspecified abdominal pain: Secondary | ICD-10-CM | POA: Diagnosis not present

## 2021-03-06 DIAGNOSIS — I10 Essential (primary) hypertension: Secondary | ICD-10-CM | POA: Diagnosis not present

## 2021-03-06 DIAGNOSIS — E119 Type 2 diabetes mellitus without complications: Secondary | ICD-10-CM | POA: Diagnosis not present

## 2021-03-07 DIAGNOSIS — E119 Type 2 diabetes mellitus without complications: Secondary | ICD-10-CM | POA: Diagnosis not present

## 2021-03-07 DIAGNOSIS — R109 Unspecified abdominal pain: Secondary | ICD-10-CM | POA: Diagnosis not present

## 2021-03-07 DIAGNOSIS — Z515 Encounter for palliative care: Secondary | ICD-10-CM | POA: Diagnosis not present

## 2021-03-07 DIAGNOSIS — F32A Depression, unspecified: Secondary | ICD-10-CM | POA: Diagnosis not present

## 2021-03-07 DIAGNOSIS — I509 Heart failure, unspecified: Secondary | ICD-10-CM | POA: Diagnosis not present

## 2021-03-07 DIAGNOSIS — I10 Essential (primary) hypertension: Secondary | ICD-10-CM | POA: Diagnosis not present

## 2021-03-07 DIAGNOSIS — G4733 Obstructive sleep apnea (adult) (pediatric): Secondary | ICD-10-CM | POA: Diagnosis not present

## 2021-03-08 DIAGNOSIS — E119 Type 2 diabetes mellitus without complications: Secondary | ICD-10-CM | POA: Diagnosis not present

## 2021-03-08 DIAGNOSIS — Z515 Encounter for palliative care: Secondary | ICD-10-CM | POA: Diagnosis not present

## 2021-03-08 DIAGNOSIS — F32A Depression, unspecified: Secondary | ICD-10-CM | POA: Diagnosis not present

## 2021-03-08 DIAGNOSIS — I10 Essential (primary) hypertension: Secondary | ICD-10-CM | POA: Diagnosis not present

## 2021-03-08 DIAGNOSIS — I509 Heart failure, unspecified: Secondary | ICD-10-CM | POA: Diagnosis not present

## 2021-03-08 DIAGNOSIS — G4733 Obstructive sleep apnea (adult) (pediatric): Secondary | ICD-10-CM | POA: Diagnosis not present

## 2021-03-08 DIAGNOSIS — R109 Unspecified abdominal pain: Secondary | ICD-10-CM | POA: Diagnosis not present

## 2021-03-09 ENCOUNTER — Other Ambulatory Visit: Payer: Self-pay | Admitting: Internal Medicine

## 2021-03-09 DIAGNOSIS — E119 Type 2 diabetes mellitus without complications: Secondary | ICD-10-CM | POA: Diagnosis not present

## 2021-03-09 DIAGNOSIS — I509 Heart failure, unspecified: Secondary | ICD-10-CM | POA: Diagnosis not present

## 2021-03-09 DIAGNOSIS — Z515 Encounter for palliative care: Secondary | ICD-10-CM | POA: Diagnosis not present

## 2021-03-09 DIAGNOSIS — I10 Essential (primary) hypertension: Secondary | ICD-10-CM | POA: Diagnosis not present

## 2021-03-09 DIAGNOSIS — F32A Depression, unspecified: Secondary | ICD-10-CM | POA: Diagnosis not present

## 2021-03-09 DIAGNOSIS — M542 Cervicalgia: Secondary | ICD-10-CM

## 2021-03-09 DIAGNOSIS — G4733 Obstructive sleep apnea (adult) (pediatric): Secondary | ICD-10-CM | POA: Diagnosis not present

## 2021-03-09 DIAGNOSIS — R109 Unspecified abdominal pain: Secondary | ICD-10-CM | POA: Diagnosis not present

## 2021-03-10 DIAGNOSIS — R109 Unspecified abdominal pain: Secondary | ICD-10-CM | POA: Diagnosis not present

## 2021-03-10 DIAGNOSIS — I509 Heart failure, unspecified: Secondary | ICD-10-CM | POA: Diagnosis not present

## 2021-03-10 DIAGNOSIS — E119 Type 2 diabetes mellitus without complications: Secondary | ICD-10-CM | POA: Diagnosis not present

## 2021-03-10 DIAGNOSIS — G4733 Obstructive sleep apnea (adult) (pediatric): Secondary | ICD-10-CM | POA: Diagnosis not present

## 2021-03-10 DIAGNOSIS — Z515 Encounter for palliative care: Secondary | ICD-10-CM | POA: Diagnosis not present

## 2021-03-10 DIAGNOSIS — I10 Essential (primary) hypertension: Secondary | ICD-10-CM | POA: Diagnosis not present

## 2021-03-10 DIAGNOSIS — F32A Depression, unspecified: Secondary | ICD-10-CM | POA: Diagnosis not present

## 2021-03-11 DIAGNOSIS — Z515 Encounter for palliative care: Secondary | ICD-10-CM | POA: Diagnosis not present

## 2021-03-11 DIAGNOSIS — G4733 Obstructive sleep apnea (adult) (pediatric): Secondary | ICD-10-CM | POA: Diagnosis not present

## 2021-03-11 DIAGNOSIS — R109 Unspecified abdominal pain: Secondary | ICD-10-CM | POA: Diagnosis not present

## 2021-03-11 DIAGNOSIS — E119 Type 2 diabetes mellitus without complications: Secondary | ICD-10-CM | POA: Diagnosis not present

## 2021-03-11 DIAGNOSIS — I509 Heart failure, unspecified: Secondary | ICD-10-CM | POA: Diagnosis not present

## 2021-03-11 DIAGNOSIS — I10 Essential (primary) hypertension: Secondary | ICD-10-CM | POA: Diagnosis not present

## 2021-03-11 DIAGNOSIS — F32A Depression, unspecified: Secondary | ICD-10-CM | POA: Diagnosis not present

## 2021-03-11 NOTE — Progress Notes (Signed)
Internal Medicine Clinic Attending  Case discussed with Dr. Sande Brothers  At the time of the visit.  We reviewed the residents history and exam and pertinent patient test results.  I agree with the assessment, diagnosis, and plan of care documented in the residents note. Given Mr. Wasilewski's very tender and painful L anterolateral neck at last visit w/o clear etiology, it was disappointing that he did not return for inperson f/u.  No clinical worsening which is somewhat reassuring,though ideally we would have had a thyroid test to investigate subacute thyroiditis which can present asymmetrically.

## 2021-03-12 ENCOUNTER — Ambulatory Visit (HOSPITAL_COMMUNITY)
Admission: RE | Admit: 2021-03-12 | Discharge: 2021-03-12 | Disposition: A | Discharge status: Home / Self Care | Payer: Medicaid Other | Source: Ambulatory Visit | Attending: Internal Medicine | Admitting: Internal Medicine

## 2021-03-12 ENCOUNTER — Other Ambulatory Visit: Payer: Self-pay

## 2021-03-12 ENCOUNTER — Other Ambulatory Visit (HOSPITAL_COMMUNITY): Payer: Self-pay | Admitting: Cardiology

## 2021-03-12 ENCOUNTER — Encounter (HOSPITAL_COMMUNITY): Payer: Self-pay | Admitting: Internal Medicine

## 2021-03-12 VITALS — BP 104/68 | HR 78 | Wt 344.2 lb

## 2021-03-12 DIAGNOSIS — R109 Unspecified abdominal pain: Secondary | ICD-10-CM | POA: Diagnosis not present

## 2021-03-12 DIAGNOSIS — E1165 Type 2 diabetes mellitus with hyperglycemia: Secondary | ICD-10-CM | POA: Insufficient documentation

## 2021-03-12 DIAGNOSIS — Z515 Encounter for palliative care: Secondary | ICD-10-CM | POA: Diagnosis not present

## 2021-03-12 DIAGNOSIS — I4510 Unspecified right bundle-branch block: Secondary | ICD-10-CM | POA: Diagnosis not present

## 2021-03-12 DIAGNOSIS — G4733 Obstructive sleep apnea (adult) (pediatric): Secondary | ICD-10-CM | POA: Diagnosis not present

## 2021-03-12 DIAGNOSIS — Z91199 Patient's noncompliance with other medical treatment and regimen due to unspecified reason: Secondary | ICD-10-CM

## 2021-03-12 DIAGNOSIS — Z7901 Long term (current) use of anticoagulants: Secondary | ICD-10-CM | POA: Diagnosis not present

## 2021-03-12 DIAGNOSIS — Z66 Do not resuscitate: Secondary | ICD-10-CM | POA: Diagnosis not present

## 2021-03-12 DIAGNOSIS — I428 Other cardiomyopathies: Secondary | ICD-10-CM | POA: Diagnosis not present

## 2021-03-12 DIAGNOSIS — I4892 Unspecified atrial flutter: Secondary | ICD-10-CM

## 2021-03-12 DIAGNOSIS — I130 Hypertensive heart and chronic kidney disease with heart failure and stage 1 through stage 4 chronic kidney disease, or unspecified chronic kidney disease: Secondary | ICD-10-CM | POA: Insufficient documentation

## 2021-03-12 DIAGNOSIS — N1832 Chronic kidney disease, stage 3b: Secondary | ICD-10-CM | POA: Insufficient documentation

## 2021-03-12 DIAGNOSIS — I10 Essential (primary) hypertension: Secondary | ICD-10-CM | POA: Diagnosis not present

## 2021-03-12 DIAGNOSIS — I5022 Chronic systolic (congestive) heart failure: Secondary | ICD-10-CM | POA: Insufficient documentation

## 2021-03-12 DIAGNOSIS — Z6841 Body Mass Index (BMI) 40.0 and over, adult: Secondary | ICD-10-CM | POA: Diagnosis not present

## 2021-03-12 DIAGNOSIS — E1122 Type 2 diabetes mellitus with diabetic chronic kidney disease: Secondary | ICD-10-CM | POA: Diagnosis not present

## 2021-03-12 DIAGNOSIS — Z79899 Other long term (current) drug therapy: Secondary | ICD-10-CM | POA: Insufficient documentation

## 2021-03-12 DIAGNOSIS — J45909 Unspecified asthma, uncomplicated: Secondary | ICD-10-CM | POA: Insufficient documentation

## 2021-03-12 DIAGNOSIS — N183 Chronic kidney disease, stage 3 unspecified: Secondary | ICD-10-CM | POA: Diagnosis not present

## 2021-03-12 DIAGNOSIS — E1142 Type 2 diabetes mellitus with diabetic polyneuropathy: Secondary | ICD-10-CM | POA: Diagnosis not present

## 2021-03-12 DIAGNOSIS — I509 Heart failure, unspecified: Secondary | ICD-10-CM | POA: Diagnosis not present

## 2021-03-12 DIAGNOSIS — F32A Depression, unspecified: Secondary | ICD-10-CM | POA: Diagnosis not present

## 2021-03-12 DIAGNOSIS — E119 Type 2 diabetes mellitus without complications: Secondary | ICD-10-CM | POA: Diagnosis not present

## 2021-03-12 LAB — COOXEMETRY PANEL
Carboxyhemoglobin: 1.3 % (ref 0.5–1.5)
Methemoglobin: 0.9 % (ref 0.0–1.5)
O2 Saturation: 46.8 %
Total hemoglobin: 14.3 g/dL (ref 12.0–16.0)

## 2021-03-12 LAB — CBC
HCT: 34.8 % — ABNORMAL LOW (ref 39.0–52.0)
Hemoglobin: 11.4 g/dL — ABNORMAL LOW (ref 13.0–17.0)
MCH: 30.1 pg (ref 26.0–34.0)
MCHC: 32.8 g/dL (ref 30.0–36.0)
MCV: 91.8 fL (ref 80.0–100.0)
Platelets: 276 10*3/uL (ref 150–400)
RBC: 3.79 MIL/uL — ABNORMAL LOW (ref 4.22–5.81)
RDW: 13.2 % (ref 11.5–15.5)
WBC: 9.9 10*3/uL (ref 4.0–10.5)
nRBC: 0 % (ref 0.0–0.2)

## 2021-03-12 LAB — BASIC METABOLIC PANEL
Anion gap: 11 (ref 5–15)
BUN: 32 mg/dL — ABNORMAL HIGH (ref 6–20)
CO2: 28 mmol/L (ref 22–32)
Calcium: 9.3 mg/dL (ref 8.9–10.3)
Chloride: 93 mmol/L — ABNORMAL LOW (ref 98–111)
Creatinine, Ser: 1.11 mg/dL (ref 0.61–1.24)
GFR, Estimated: >60 mL/min (ref >60)
Glucose, Bld: 161 mg/dL — ABNORMAL HIGH (ref 70–99)
Potassium: 2.9 mmol/L — ABNORMAL LOW (ref 3.5–5.1)
Sodium: 132 mmol/L — ABNORMAL LOW (ref 135–145)

## 2021-03-12 LAB — BRAIN NATRIURETIC PEPTIDE: B Natriuretic Peptide: 448.3 pg/mL — ABNORMAL HIGH (ref 0.0–100.0)

## 2021-03-12 MED ORDER — METOLAZONE 2.5 MG PO TABS: 2.5000 mg | ORAL_TABLET | ORAL | 2 refills | Status: DC

## 2021-03-12 NOTE — Patient Instructions (Signed)
Medication Changes:  Decrease Metolazone to only once a week, ON MONDAYS  Lab Work:  Labs done today, your results will be available in MyChart, we will contact you for abnormal readings.  Testing/Procedures:  Your physician has requested that you have an echocardiogram. Echocardiography is a painless test that uses sound waves to create images of your heart. It provides your doctor with information about the size and shape of your heart and how well your hearts chambers and valves are working. This procedure takes approximately one hour. There are no restrictions for this procedure.  Referrals:  You have been referred to the PREP Program at the [facility], they will call you to schedule a visit  Special Instructions // Education:  Do the following things EVERYDAY: Weigh yourself in the morning before breakfast. Write it down and keep it in a log. Take your medicines as prescribed Eat low salt foods--Limit salt (sodium) to 2000 mg per day.  Stay as active as you can everyday Limit all fluids for the day to less than 2 liters  Follow-Up in: 1 month  At the [facility], you and your health needs are our priority. We have a designated team specialized in the treatment of Heart Failure. This Care Team includes your primary Heart Failure Specialized Cardiologist (physician), Advanced Practice Providers (APPs- Physician Assistants and Nurse Practitioners), and Pharmacist who all work together to provide you with the care you need, when you need it.   You may see any of the following providers on your designated Care Team at your next follow up:  Dr Arvilla Meres Dr Carron Curie, NP Robbie Lis, [place] [facility] [place], [place] Karle Plumber, PharmD   Please be sure to bring in all your medications bottles to every appointment.   Need to Contact us:  If you have any questions or concerns before your next appointment please send [place] a message  through [place] or call our office at [phone].    TO LEAVE A MESSAGE FOR THE NURSE SELECT OPTION 2, PLEASE LEAVE A MESSAGE INCLUDING: YOUR NAME DATE OF BIRTH CALL BACK NUMBER REASON FOR CALL**this is important as we prioritize the call backs  YOU WILL RECEIVE A CALL BACK THE SAME DAY AS LONG AS YOU CALL BEFORE 4:00 PM

## 2021-03-12 NOTE — Progress Notes (Signed)
Labs drawn from primary lumen of PICC, 2nd lumen would not return any blood, per Dr Gala Romney sch pt for TPA, pt agreeable, will call him to schedule.

## 2021-03-12 NOTE — Progress Notes (Signed)
ADVANCED HF CLINIC NOTE  PCP: [name], MD HF Cardiologist: Dr. Haroldine Laws   HPI: Jonathan [name] is a 36 y.o.with a history of chronic HFrEF, NICM, HTN, asthma, OSA, morbid obesity, and uncontrolled DM.    Diagnosed with systolic HF in [place] [address]. EF 15-20%  Repeat Echo 02/2017. EF had normalized to 55% but went back down when he was off HF meds. EF in 2021 back down to 30-35%. He has refused ICD.    He has been followed in the HF clinic and was last seen 05/2020.  He cancelled follow up appointments 08/2020 and 09/2020.   Admitted 10/28/20 with A/C HFrEF, A fib RVR , and marked volume overload. Hospital course complicated by low output so milrinone started. Diuresed with lasix drip + metolazone+ diamox.   Failed milrinone wean and was discharged on milrinone. He was not a candidate for advanced therapies due to noncompliance and uncontrolled diabetes. HGB A1C >14, Palliative consulted for goals of care. He elected DNR/DNI. Referred to [facility] for Hospice services.   Seen in ED 11/25/20 with home milrinone pump issues.   Today he returns for HF follow up with his wife (who is nurse's aide) and mother. Remains on milrinone 0.375. Feels ok. Able to walk around the block without too much SOB. Swelling under controlled. No CP, orthopnea or PND.  A1c 14 -> 8.3   Cardiac Studies   - Echo 9/22: EF<20%, severely decreased LV, grade II DD.  - Echo 10/21: EF 30-35% with moderate RV dysfunction in setting of recurrent RBBB with significant dyssynchrony.  - Echo 04/2019: EF 30-35%  - Echo 2019: EF 55%   - Echo EF 15-20% in [place] in 2017. Cath around that time without   ROS: All systems negative except as listed in HPI, PMH and Problem List.  SH:  Social History   Socioeconomic History   Marital status: Married    Spouse name: Not on file   Number of children: Not on file   Years of education: Not on file   Highest education level: Not on file  Occupational History   Not on file  Tobacco Use    Smoking status: Never   Smokeless tobacco: Never  Vaping Use   Vaping Use: Never used  Substance and Sexual Activity   Alcohol use: No   Drug use: No   Sexual activity: Never  Other Topics Concern   Not on file  Social History Narrative   Not on file   Social Determinants of Health   Financial Resource Strain: Low Risk    Difficulty of Paying Living Expenses: Not very hard  Food Insecurity: No Food Insecurity   Worried About Running Out of Food in the Last Year: Never true   Ran Out of Food in the Last Year: Never true  Transportation Needs: No Transportation Needs   Lack of Transportation (Medical): No   Lack of Transportation (Non-Medical): No  Physical Activity: Not on file  Stress: Not on file  Social Connections: Not on file  Intimate Partner Violence: Not on file   FH:  Family History  Problem Relation Age of Onset   Diabetes Mellitus II Mother    Heart failure Mother    Hypertension Mother    Heart disease Mother    Heart failure Father    Kidney disease Father    Heart disease Father    Congestive Heart Failure Neg Hx    Past Medical History:  Diagnosis Date   Acute pain  of left foot 05/03/2017   Asthma    Asthma, chronic, mild persistent, uncomplicated 08/08/7626   Charcot ankle, left 05/31/2017   CHF (congestive heart failure) (HCC)    Chronic systolic (congestive) heart failure ([place])    Diabetes mellitus type 2 in obese ([place]) 04/21/2014   Diabetic polyneuropathy associated with type 2 diabetes mellitus ([place]) 03/14/2017   Essential hypertension 10/26/2016   Hypertension    Obesity    Obesity, Class III, BMI 40-49.9 (morbid obesity) ([place]) 10/26/2016   OSA (obstructive sleep apnea) 12/02/2016   Type 2 diabetes mellitus with diabetic neuropathy (HCC)    Current Outpatient Medications  Medication Sig Dispense Refill   Accu-Chek Softclix Lancets lancets Check blood sugar 3x a day 100 each 11   albuterol (VENTOLIN HFA) 108 (90 Base) MCG/ACT inhaler  Inhale 2 puffs into the lungs every 4 (four) hours as needed for wheezing or shortness of breath. 18 g 1   amiodarone (PACERONE) 200 MG tablet Take 0.5 tablets (100 mg total) by mouth daily. 15 tablet 2   apixaban (ELIQUIS) 5 MG TABS tablet Take 1 tablet (5 mg total) by mouth 2 (two) times daily. 60 tablet 2   azelastine (ASTELIN) 0.1 % nasal spray Place into both nostrils as needed for rhinitis. Use in each nostril as directed     blood glucose meter kit and supplies KIT Dispense based on patient and insurance preference. Use up to four times daily as directed. (FOR ICD-9 250.00, 250.01). For QAC - HS accuchecks. May switch to any brand. 1 each 1   Continuous Blood Gluc Sensor (FREESTYLE LIBRE 2 SENSOR) MISC Use to check blood sugar at least 6 times a day 2 each 11   digoxin (LANOXIN) 0.125 MG tablet Take 1 tablet (125 mcg total) by mouth daily. 90 tablet 3   gabapentin (NEURONTIN) 300 MG capsule Take 300 mg by mouth as needed.     glucose blood (ACCU-CHEK GUIDE) test strip Check blood sugar 3 times per day 100 each 11   insulin aspart (NOVOLOG) 100 UNIT/ML injection Inject 20 Units into the skin 3 (three) times daily with meals. 20 mL 11   insulin glargine (LANTUS) 100 UNIT/ML injection Inject 0.3 mLs (30 Units total) into the skin 2 (two) times daily. 20 mL 11   Insulin Syringe-Needle U-100 30G X 1/2" 1 ML MISC Use to inject insulin 4 times a day. 100 each 2   isosorbide-hydrALAZINE (BIDIL) 20-37.5 MG tablet Take 1 tablet by mouth 3 (three) times daily. 90 tablet 2   KLOR-CON M20 20 MEQ tablet TAKE 4 TABLETS (80 MEQ TOTAL) BY MOUTH 2 (TWO) TIMES DAILY. 120 tablet 4   loratadine (CLARITIN) 10 MG tablet Take 1 tablet (10 mg total) by mouth 2 (two) times daily as needed for allergies (Can use an extra dose during flare ups.). 60 tablet 5   metolazone (ZAROXOLYN) 2.5 MG tablet Take 1 tablet (2.5 mg total) by mouth 2 (two) times a week. Take 2 times a week on Mondays and Fridays starting 11/24/2020 8  tablet 2   milrinone (PRIMACOR) 20 MG/100 ML SOLN infusion Inject 0.0622 mg/min into the vein continuous.     sacubitril-valsartan (ENTRESTO) 24-26 MG Take 1 tablet by mouth 2 (two) times daily. 60 tablet 3   spironolactone (ALDACTONE) 25 MG tablet Take 0.5 tablets (12.5 mg total) by mouth daily. 45 tablet 3   torsemide (DEMADEX) 20 MG tablet TAKE 4 TABLETS TWICE DAILY 240 tablet 3   traMADol (ULTRAM) 50 MG  tablet TAKE 1 TABLET BY MOUTH EVERY 6 HOURS AS NEEDED. 20 tablet 0   TRULICITY 1.5 HA/5.7XU SOPN INJECT 1.5 MG INTO THE SKIN ONCE A WEEK. 2 mL 5   No current facility-administered medications for this encounter.   BP 104/68    Pulse 78    Wt (!) 156.1 kg (344 lb 3.2 oz)    SpO2 98%    BMI 44.19 kg/m   Wt Readings from Last 3 Encounters:  03/12/21 (!) 156.1 kg (344 lb 3.2 oz)  02/18/21 (!) 158.8 kg (350 lb)  02/18/21 (!) 159 kg (350 lb 8 oz)   PHYSICAL EXAM: General:  Obese male sitting in chair.  No resp difficulty HEENT: normal Neck: supple. no JVD. Carotids 2+ bilat; no bruits. No lymphadenopathy or thryomegaly appreciated. Cor: PMI nondisplaced. Regular rate & rhythm. No rubs, gallops or murmurs. Lungs: clear Abdomen: obese soft, nontender, nondistended. No hepatosplenomegaly. No bruits or masses. Good bowel sounds. Extremities: no cyanosis, clubbing, rash, edema Neuro: alert & orientedx3, cranial nerves grossly intact. moves all 4 extremities w/o difficulty. Affect pleasant  ASSESSMENT & PLAN:  End Stage HFrEF , NICM  - Echo back in 2021 EF 30-35 % with moderate RV dysfunction in the setting of recurrent RBBB with significant dyssynchrony.  - Echo 10/2020 EF < 20% and moderate RV dysfunction. Discharged on milrinone 0.375 mg . - Much improved today NYHA II-III - Volume status stable on Torsemide 80 mg bid +  metolazone 2.5 on Mondays and Fridays. Will decrease metolazone to Monday only.   - Continue Entresto 24/26 bid  - Continue digoxin 0.125 - Continues spiro 12.5 -  Intolerant SGLT2i due to candidiasis - Continue Bidil 1 tablet tid. - No beta blocker d/t low output - Not candidate for transplant currently due to size  - Overall much improved. Will check co-ox today and consider milrinone wean. Now that DM2 is under better control and he is compliant with meds may be VAD candidate if unable to wean  - Labs today - Repeat echo   2. Uncontrolled DM - 9/1 Hgb A1C > 14 -> 8.3 - No SGLT2i d/t candidiasis  3. CKD Stage IIIb - check labs today   4. Paroxysmal Atrial Flutter  - In NSR today - Continue amio to 100 mg daily.  - Continue Eliquis 5 mg bid. No bleeding issues  5. OSA: Severe OSA AHI 65 - Does not wear CPAP.  6. Obesity - Body mass index is 44.19 kg/m. - Discussed portion control. He is working on this.  7.  GOC - Followed by [facility] for Hospice Services.  - Discussed further the role of milrinone. - Plan as above   Total time spent 45 minutes. Over half that time spent discussing above.    Glori Bickers MD 2:02 PM

## 2021-03-13 DIAGNOSIS — R109 Unspecified abdominal pain: Secondary | ICD-10-CM | POA: Diagnosis not present

## 2021-03-13 DIAGNOSIS — E119 Type 2 diabetes mellitus without complications: Secondary | ICD-10-CM | POA: Diagnosis not present

## 2021-03-13 DIAGNOSIS — F32A Depression, unspecified: Secondary | ICD-10-CM | POA: Diagnosis not present

## 2021-03-13 DIAGNOSIS — I509 Heart failure, unspecified: Secondary | ICD-10-CM | POA: Diagnosis not present

## 2021-03-13 DIAGNOSIS — I10 Essential (primary) hypertension: Secondary | ICD-10-CM | POA: Diagnosis not present

## 2021-03-13 DIAGNOSIS — G4733 Obstructive sleep apnea (adult) (pediatric): Secondary | ICD-10-CM | POA: Diagnosis not present

## 2021-03-13 DIAGNOSIS — Z515 Encounter for palliative care: Secondary | ICD-10-CM | POA: Diagnosis not present

## 2021-03-14 DIAGNOSIS — G4733 Obstructive sleep apnea (adult) (pediatric): Secondary | ICD-10-CM | POA: Diagnosis not present

## 2021-03-14 DIAGNOSIS — F32A Depression, unspecified: Secondary | ICD-10-CM | POA: Diagnosis not present

## 2021-03-14 DIAGNOSIS — I509 Heart failure, unspecified: Secondary | ICD-10-CM | POA: Diagnosis not present

## 2021-03-14 DIAGNOSIS — Z515 Encounter for palliative care: Secondary | ICD-10-CM | POA: Diagnosis not present

## 2021-03-14 DIAGNOSIS — I10 Essential (primary) hypertension: Secondary | ICD-10-CM | POA: Diagnosis not present

## 2021-03-14 DIAGNOSIS — E119 Type 2 diabetes mellitus without complications: Secondary | ICD-10-CM | POA: Diagnosis not present

## 2021-03-14 DIAGNOSIS — R109 Unspecified abdominal pain: Secondary | ICD-10-CM | POA: Diagnosis not present

## 2021-03-15 DIAGNOSIS — E119 Type 2 diabetes mellitus without complications: Secondary | ICD-10-CM | POA: Diagnosis not present

## 2021-03-15 DIAGNOSIS — F32A Depression, unspecified: Secondary | ICD-10-CM | POA: Diagnosis not present

## 2021-03-15 DIAGNOSIS — I509 Heart failure, unspecified: Secondary | ICD-10-CM | POA: Diagnosis not present

## 2021-03-15 DIAGNOSIS — R109 Unspecified abdominal pain: Secondary | ICD-10-CM | POA: Diagnosis not present

## 2021-03-15 DIAGNOSIS — Z515 Encounter for palliative care: Secondary | ICD-10-CM | POA: Diagnosis not present

## 2021-03-15 DIAGNOSIS — I10 Essential (primary) hypertension: Secondary | ICD-10-CM | POA: Diagnosis not present

## 2021-03-15 DIAGNOSIS — G4733 Obstructive sleep apnea (adult) (pediatric): Secondary | ICD-10-CM | POA: Diagnosis not present

## 2021-03-16 ENCOUNTER — Telehealth (HOSPITAL_COMMUNITY): Payer: Self-pay

## 2021-03-16 DIAGNOSIS — I509 Heart failure, unspecified: Secondary | ICD-10-CM | POA: Diagnosis not present

## 2021-03-16 DIAGNOSIS — F32A Depression, unspecified: Secondary | ICD-10-CM | POA: Diagnosis not present

## 2021-03-16 DIAGNOSIS — Z515 Encounter for palliative care: Secondary | ICD-10-CM | POA: Diagnosis not present

## 2021-03-16 DIAGNOSIS — G4733 Obstructive sleep apnea (adult) (pediatric): Secondary | ICD-10-CM | POA: Diagnosis not present

## 2021-03-16 DIAGNOSIS — R109 Unspecified abdominal pain: Secondary | ICD-10-CM | POA: Diagnosis not present

## 2021-03-16 DIAGNOSIS — I10 Essential (primary) hypertension: Secondary | ICD-10-CM | POA: Diagnosis not present

## 2021-03-16 DIAGNOSIS — E119 Type 2 diabetes mellitus without complications: Secondary | ICD-10-CM | POA: Diagnosis not present

## 2021-03-16 NOTE — Telephone Encounter (Addendum)
Pt aware, agreeable, and verbalized understanding   ----- Message from Dolores Patty, MD sent at 03/15/2021  6:05 PM EST ----- Have him take kdur daily for 2 days, Then add k dur 40 daily,   Repeat BMET & Mag on Thursday

## 2021-03-16 NOTE — Addendum Note (Signed)
Encounter addended by: Noralee Space, RN on: 03/16/2021 11:30 AM  Actions taken: Order list changed, Diagnosis association updated

## 2021-03-17 ENCOUNTER — Other Ambulatory Visit: Payer: Self-pay

## 2021-03-17 ENCOUNTER — Ambulatory Visit (HOSPITAL_COMMUNITY)
Admission: RE | Admit: 2021-03-17 | Discharge: 2021-03-17 | Disposition: A | Discharge status: Home / Self Care | Payer: Medicaid Other | Source: Ambulatory Visit | Attending: Orthopedic Surgery | Admitting: Orthopedic Surgery

## 2021-03-17 ENCOUNTER — Telehealth (HOSPITAL_COMMUNITY): Payer: Self-pay | Admitting: Internal Medicine

## 2021-03-17 DIAGNOSIS — I5022 Chronic systolic (congestive) heart failure: Secondary | ICD-10-CM | POA: Diagnosis not present

## 2021-03-17 DIAGNOSIS — Z515 Encounter for palliative care: Secondary | ICD-10-CM | POA: Diagnosis not present

## 2021-03-17 DIAGNOSIS — I340 Nonrheumatic mitral (valve) insufficiency: Secondary | ICD-10-CM | POA: Insufficient documentation

## 2021-03-17 DIAGNOSIS — E119 Type 2 diabetes mellitus without complications: Secondary | ICD-10-CM | POA: Diagnosis not present

## 2021-03-17 DIAGNOSIS — I10 Essential (primary) hypertension: Secondary | ICD-10-CM | POA: Diagnosis not present

## 2021-03-17 DIAGNOSIS — R109 Unspecified abdominal pain: Secondary | ICD-10-CM | POA: Diagnosis not present

## 2021-03-17 DIAGNOSIS — I110 Hypertensive heart disease with heart failure: Secondary | ICD-10-CM | POA: Diagnosis present

## 2021-03-17 DIAGNOSIS — G4733 Obstructive sleep apnea (adult) (pediatric): Secondary | ICD-10-CM | POA: Diagnosis not present

## 2021-03-17 DIAGNOSIS — F32A Depression, unspecified: Secondary | ICD-10-CM | POA: Diagnosis not present

## 2021-03-17 DIAGNOSIS — I509 Heart failure, unspecified: Secondary | ICD-10-CM | POA: Diagnosis not present

## 2021-03-17 LAB — ECHOCARDIOGRAM COMPLETE
MV M vel: 3.34 m/s
MV Peak grad: 44.6 mmHg
S' Lateral: 3.83 cm

## 2021-03-17 MED ORDER — PERFLUTREN LIPID MICROSPHERE
1.0000 mL | INTRAVENOUS | Status: AC | PRN
  Administered 2021-03-17: 3 mL via INTRAVENOUS
  Filled 2021-03-17: qty 10

## 2021-03-17 NOTE — Telephone Encounter (Signed)
Pt request refill for Bidil, please send script to [facility] on [address], pt is out of meds.. Thank you

## 2021-03-18 ENCOUNTER — Other Ambulatory Visit (HOSPITAL_COMMUNITY): Payer: Self-pay | Admitting: *Deleted

## 2021-03-18 DIAGNOSIS — R109 Unspecified abdominal pain: Secondary | ICD-10-CM | POA: Diagnosis not present

## 2021-03-18 DIAGNOSIS — E119 Type 2 diabetes mellitus without complications: Secondary | ICD-10-CM | POA: Diagnosis not present

## 2021-03-18 DIAGNOSIS — Z515 Encounter for palliative care: Secondary | ICD-10-CM | POA: Diagnosis not present

## 2021-03-18 DIAGNOSIS — I10 Essential (primary) hypertension: Secondary | ICD-10-CM | POA: Diagnosis not present

## 2021-03-18 DIAGNOSIS — I509 Heart failure, unspecified: Secondary | ICD-10-CM | POA: Diagnosis not present

## 2021-03-18 DIAGNOSIS — G4733 Obstructive sleep apnea (adult) (pediatric): Secondary | ICD-10-CM | POA: Diagnosis not present

## 2021-03-18 DIAGNOSIS — F32A Depression, unspecified: Secondary | ICD-10-CM | POA: Diagnosis not present

## 2021-03-18 MED ORDER — ISOSORB DINITRATE-HYDRALAZINE 20-37.5 MG PO TABS: 1.0000 | ORAL_TABLET | Freq: Three times a day (TID) | ORAL | 2 refills | Status: DC

## 2021-03-19 DIAGNOSIS — E119 Type 2 diabetes mellitus without complications: Secondary | ICD-10-CM | POA: Diagnosis not present

## 2021-03-19 DIAGNOSIS — Z515 Encounter for palliative care: Secondary | ICD-10-CM | POA: Diagnosis not present

## 2021-03-19 DIAGNOSIS — G4733 Obstructive sleep apnea (adult) (pediatric): Secondary | ICD-10-CM | POA: Diagnosis not present

## 2021-03-19 DIAGNOSIS — I10 Essential (primary) hypertension: Secondary | ICD-10-CM | POA: Diagnosis not present

## 2021-03-19 DIAGNOSIS — R109 Unspecified abdominal pain: Secondary | ICD-10-CM | POA: Diagnosis not present

## 2021-03-19 DIAGNOSIS — I509 Heart failure, unspecified: Secondary | ICD-10-CM | POA: Diagnosis not present

## 2021-03-19 DIAGNOSIS — F32A Depression, unspecified: Secondary | ICD-10-CM | POA: Diagnosis not present

## 2021-03-20 ENCOUNTER — Telehealth: Payer: Self-pay

## 2021-03-20 DIAGNOSIS — I509 Heart failure, unspecified: Secondary | ICD-10-CM | POA: Diagnosis not present

## 2021-03-20 DIAGNOSIS — E119 Type 2 diabetes mellitus without complications: Secondary | ICD-10-CM | POA: Diagnosis not present

## 2021-03-20 DIAGNOSIS — Z515 Encounter for palliative care: Secondary | ICD-10-CM | POA: Diagnosis not present

## 2021-03-20 DIAGNOSIS — G4733 Obstructive sleep apnea (adult) (pediatric): Secondary | ICD-10-CM | POA: Diagnosis not present

## 2021-03-20 DIAGNOSIS — I10 Essential (primary) hypertension: Secondary | ICD-10-CM | POA: Diagnosis not present

## 2021-03-20 DIAGNOSIS — F32A Depression, unspecified: Secondary | ICD-10-CM | POA: Diagnosis not present

## 2021-03-20 DIAGNOSIS — R109 Unspecified abdominal pain: Secondary | ICD-10-CM | POA: Diagnosis not present

## 2021-03-20 NOTE — Telephone Encounter (Signed)
Call to pt reference referral to PREP S/W wife-pt is currently speaking with Social Worker  Explained program to her and feels pt is interested in participating Needs an afternoon class. Next Jonathan [name] 230p-345p class will be in March. Advised will call back to confirm start date and time and complete intake.

## 2021-03-21 DIAGNOSIS — E119 Type 2 diabetes mellitus without complications: Secondary | ICD-10-CM | POA: Diagnosis not present

## 2021-03-21 DIAGNOSIS — I10 Essential (primary) hypertension: Secondary | ICD-10-CM | POA: Diagnosis not present

## 2021-03-21 DIAGNOSIS — I509 Heart failure, unspecified: Secondary | ICD-10-CM | POA: Diagnosis not present

## 2021-03-21 DIAGNOSIS — R109 Unspecified abdominal pain: Secondary | ICD-10-CM | POA: Diagnosis not present

## 2021-03-21 DIAGNOSIS — Z515 Encounter for palliative care: Secondary | ICD-10-CM | POA: Diagnosis not present

## 2021-03-21 DIAGNOSIS — F32A Depression, unspecified: Secondary | ICD-10-CM | POA: Diagnosis not present

## 2021-03-21 DIAGNOSIS — G4733 Obstructive sleep apnea (adult) (pediatric): Secondary | ICD-10-CM | POA: Diagnosis not present

## 2021-03-22 DIAGNOSIS — R109 Unspecified abdominal pain: Secondary | ICD-10-CM | POA: Diagnosis not present

## 2021-03-22 DIAGNOSIS — E119 Type 2 diabetes mellitus without complications: Secondary | ICD-10-CM | POA: Diagnosis not present

## 2021-03-22 DIAGNOSIS — I10 Essential (primary) hypertension: Secondary | ICD-10-CM | POA: Diagnosis not present

## 2021-03-22 DIAGNOSIS — I509 Heart failure, unspecified: Secondary | ICD-10-CM | POA: Diagnosis not present

## 2021-03-22 DIAGNOSIS — F32A Depression, unspecified: Secondary | ICD-10-CM | POA: Diagnosis not present

## 2021-03-22 DIAGNOSIS — Z515 Encounter for palliative care: Secondary | ICD-10-CM | POA: Diagnosis not present

## 2021-03-22 DIAGNOSIS — G4733 Obstructive sleep apnea (adult) (pediatric): Secondary | ICD-10-CM | POA: Diagnosis not present

## 2021-03-23 DIAGNOSIS — F32A Depression, unspecified: Secondary | ICD-10-CM | POA: Diagnosis not present

## 2021-03-23 DIAGNOSIS — R109 Unspecified abdominal pain: Secondary | ICD-10-CM | POA: Diagnosis not present

## 2021-03-23 DIAGNOSIS — I509 Heart failure, unspecified: Secondary | ICD-10-CM | POA: Diagnosis not present

## 2021-03-23 DIAGNOSIS — I10 Essential (primary) hypertension: Secondary | ICD-10-CM | POA: Diagnosis not present

## 2021-03-23 DIAGNOSIS — Z515 Encounter for palliative care: Secondary | ICD-10-CM | POA: Diagnosis not present

## 2021-03-23 DIAGNOSIS — E119 Type 2 diabetes mellitus without complications: Secondary | ICD-10-CM | POA: Diagnosis not present

## 2021-03-23 DIAGNOSIS — G4733 Obstructive sleep apnea (adult) (pediatric): Secondary | ICD-10-CM | POA: Diagnosis not present

## 2021-03-24 DIAGNOSIS — R109 Unspecified abdominal pain: Secondary | ICD-10-CM | POA: Diagnosis not present

## 2021-03-24 DIAGNOSIS — I509 Heart failure, unspecified: Secondary | ICD-10-CM | POA: Diagnosis not present

## 2021-03-24 DIAGNOSIS — F32A Depression, unspecified: Secondary | ICD-10-CM | POA: Diagnosis not present

## 2021-03-24 DIAGNOSIS — Z515 Encounter for palliative care: Secondary | ICD-10-CM | POA: Diagnosis not present

## 2021-03-24 DIAGNOSIS — E119 Type 2 diabetes mellitus without complications: Secondary | ICD-10-CM | POA: Diagnosis not present

## 2021-03-24 DIAGNOSIS — I10 Essential (primary) hypertension: Secondary | ICD-10-CM | POA: Diagnosis not present

## 2021-03-24 DIAGNOSIS — G4733 Obstructive sleep apnea (adult) (pediatric): Secondary | ICD-10-CM | POA: Diagnosis not present

## 2021-03-25 DIAGNOSIS — I10 Essential (primary) hypertension: Secondary | ICD-10-CM | POA: Diagnosis not present

## 2021-03-25 DIAGNOSIS — F32A Depression, unspecified: Secondary | ICD-10-CM | POA: Diagnosis not present

## 2021-03-25 DIAGNOSIS — E1169 Type 2 diabetes mellitus with other specified complication: Secondary | ICD-10-CM | POA: Diagnosis not present

## 2021-03-25 DIAGNOSIS — G4733 Obstructive sleep apnea (adult) (pediatric): Secondary | ICD-10-CM | POA: Diagnosis not present

## 2021-03-25 DIAGNOSIS — Z515 Encounter for palliative care: Secondary | ICD-10-CM | POA: Diagnosis not present

## 2021-03-25 DIAGNOSIS — E119 Type 2 diabetes mellitus without complications: Secondary | ICD-10-CM | POA: Diagnosis not present

## 2021-03-25 DIAGNOSIS — I509 Heart failure, unspecified: Secondary | ICD-10-CM | POA: Diagnosis not present

## 2021-03-25 DIAGNOSIS — R109 Unspecified abdominal pain: Secondary | ICD-10-CM | POA: Diagnosis not present

## 2021-03-26 ENCOUNTER — Other Ambulatory Visit (HOSPITAL_COMMUNITY): Payer: Self-pay | Admitting: *Deleted

## 2021-03-26 DIAGNOSIS — I509 Heart failure, unspecified: Secondary | ICD-10-CM | POA: Diagnosis not present

## 2021-03-26 DIAGNOSIS — I10 Essential (primary) hypertension: Secondary | ICD-10-CM | POA: Diagnosis not present

## 2021-03-26 DIAGNOSIS — G4733 Obstructive sleep apnea (adult) (pediatric): Secondary | ICD-10-CM | POA: Diagnosis not present

## 2021-03-26 DIAGNOSIS — I5022 Chronic systolic (congestive) heart failure: Secondary | ICD-10-CM

## 2021-03-26 DIAGNOSIS — E119 Type 2 diabetes mellitus without complications: Secondary | ICD-10-CM | POA: Diagnosis not present

## 2021-03-26 DIAGNOSIS — F32A Depression, unspecified: Secondary | ICD-10-CM | POA: Diagnosis not present

## 2021-03-26 DIAGNOSIS — Z515 Encounter for palliative care: Secondary | ICD-10-CM | POA: Diagnosis not present

## 2021-03-26 DIAGNOSIS — R109 Unspecified abdominal pain: Secondary | ICD-10-CM | POA: Diagnosis not present

## 2021-03-27 ENCOUNTER — Other Ambulatory Visit: Payer: Self-pay

## 2021-03-27 ENCOUNTER — Encounter (HOSPITAL_COMMUNITY)
Admission: RE | Admit: 2021-03-27 | Discharge: 2021-03-27 | Disposition: A | Discharge status: Home / Self Care | Payer: Medicaid Other | Source: Ambulatory Visit | Attending: Internal Medicine | Admitting: Internal Medicine

## 2021-03-27 DIAGNOSIS — F32A Depression, unspecified: Secondary | ICD-10-CM | POA: Diagnosis not present

## 2021-03-27 DIAGNOSIS — G4733 Obstructive sleep apnea (adult) (pediatric): Secondary | ICD-10-CM | POA: Diagnosis not present

## 2021-03-27 DIAGNOSIS — I5022 Chronic systolic (congestive) heart failure: Secondary | ICD-10-CM | POA: Diagnosis not present

## 2021-03-27 DIAGNOSIS — Z515 Encounter for palliative care: Secondary | ICD-10-CM | POA: Diagnosis not present

## 2021-03-27 DIAGNOSIS — E119 Type 2 diabetes mellitus without complications: Secondary | ICD-10-CM | POA: Diagnosis not present

## 2021-03-27 DIAGNOSIS — I509 Heart failure, unspecified: Secondary | ICD-10-CM | POA: Diagnosis not present

## 2021-03-27 DIAGNOSIS — R109 Unspecified abdominal pain: Secondary | ICD-10-CM | POA: Diagnosis not present

## 2021-03-27 DIAGNOSIS — I10 Essential (primary) hypertension: Secondary | ICD-10-CM | POA: Diagnosis not present

## 2021-03-27 MED ORDER — ALTEPLASE 2 MG IJ SOLR
2.0000 mg | Freq: Once | INTRAMUSCULAR | Status: AC
  Administered 2021-03-27: 2 mg

## 2021-03-27 MED ORDER — ALTEPLASE 2 MG IJ SOLR
INTRAMUSCULAR | Status: AC
  Filled 2021-03-27: qty 2

## 2021-03-27 NOTE — Progress Notes (Signed)
TPA removed, able to remove blood sluggish, some difficutly with flushing, no kinking noted at insertion site, does not change with arm position changed. Discussed with patient and nurse regarding POC. Pt declined further treatment at this time. States "lines does this now and then". Instructed nurse to reconsult IV team with further questions or needs. VU. Tomasita Morrow, RN VAST

## 2021-03-28 DIAGNOSIS — F32A Depression, unspecified: Secondary | ICD-10-CM | POA: Diagnosis not present

## 2021-03-28 DIAGNOSIS — Z515 Encounter for palliative care: Secondary | ICD-10-CM | POA: Diagnosis not present

## 2021-03-28 DIAGNOSIS — R109 Unspecified abdominal pain: Secondary | ICD-10-CM | POA: Diagnosis not present

## 2021-03-28 DIAGNOSIS — I509 Heart failure, unspecified: Secondary | ICD-10-CM | POA: Diagnosis not present

## 2021-03-28 DIAGNOSIS — G4733 Obstructive sleep apnea (adult) (pediatric): Secondary | ICD-10-CM | POA: Diagnosis not present

## 2021-03-28 DIAGNOSIS — E119 Type 2 diabetes mellitus without complications: Secondary | ICD-10-CM | POA: Diagnosis not present

## 2021-03-28 DIAGNOSIS — I10 Essential (primary) hypertension: Secondary | ICD-10-CM | POA: Diagnosis not present

## 2021-03-29 DIAGNOSIS — R109 Unspecified abdominal pain: Secondary | ICD-10-CM | POA: Diagnosis not present

## 2021-03-29 DIAGNOSIS — I509 Heart failure, unspecified: Secondary | ICD-10-CM | POA: Diagnosis not present

## 2021-03-29 DIAGNOSIS — Z515 Encounter for palliative care: Secondary | ICD-10-CM | POA: Diagnosis not present

## 2021-03-29 DIAGNOSIS — E119 Type 2 diabetes mellitus without complications: Secondary | ICD-10-CM | POA: Diagnosis not present

## 2021-03-29 DIAGNOSIS — F32A Depression, unspecified: Secondary | ICD-10-CM | POA: Diagnosis not present

## 2021-03-29 DIAGNOSIS — G4733 Obstructive sleep apnea (adult) (pediatric): Secondary | ICD-10-CM | POA: Diagnosis not present

## 2021-03-29 DIAGNOSIS — I10 Essential (primary) hypertension: Secondary | ICD-10-CM | POA: Diagnosis not present

## 2021-03-30 ENCOUNTER — Encounter (HOSPITAL_COMMUNITY): Payer: Self-pay | Admitting: *Deleted

## 2021-03-30 DIAGNOSIS — E119 Type 2 diabetes mellitus without complications: Secondary | ICD-10-CM | POA: Diagnosis not present

## 2021-03-30 DIAGNOSIS — I10 Essential (primary) hypertension: Secondary | ICD-10-CM | POA: Diagnosis not present

## 2021-03-30 DIAGNOSIS — F32A Depression, unspecified: Secondary | ICD-10-CM | POA: Diagnosis not present

## 2021-03-30 DIAGNOSIS — I509 Heart failure, unspecified: Secondary | ICD-10-CM | POA: Diagnosis not present

## 2021-03-30 DIAGNOSIS — G4733 Obstructive sleep apnea (adult) (pediatric): Secondary | ICD-10-CM | POA: Diagnosis not present

## 2021-03-30 DIAGNOSIS — Z515 Encounter for palliative care: Secondary | ICD-10-CM | POA: Diagnosis not present

## 2021-03-30 DIAGNOSIS — R109 Unspecified abdominal pain: Secondary | ICD-10-CM | POA: Diagnosis not present

## 2021-03-31 DIAGNOSIS — G4733 Obstructive sleep apnea (adult) (pediatric): Secondary | ICD-10-CM | POA: Diagnosis not present

## 2021-03-31 DIAGNOSIS — I509 Heart failure, unspecified: Secondary | ICD-10-CM | POA: Diagnosis not present

## 2021-03-31 DIAGNOSIS — E119 Type 2 diabetes mellitus without complications: Secondary | ICD-10-CM | POA: Diagnosis not present

## 2021-03-31 DIAGNOSIS — I10 Essential (primary) hypertension: Secondary | ICD-10-CM | POA: Diagnosis not present

## 2021-03-31 DIAGNOSIS — R109 Unspecified abdominal pain: Secondary | ICD-10-CM | POA: Diagnosis not present

## 2021-03-31 DIAGNOSIS — Z515 Encounter for palliative care: Secondary | ICD-10-CM | POA: Diagnosis not present

## 2021-03-31 DIAGNOSIS — F32A Depression, unspecified: Secondary | ICD-10-CM | POA: Diagnosis not present

## 2021-04-01 DIAGNOSIS — Z515 Encounter for palliative care: Secondary | ICD-10-CM | POA: Diagnosis not present

## 2021-04-01 DIAGNOSIS — G4733 Obstructive sleep apnea (adult) (pediatric): Secondary | ICD-10-CM | POA: Diagnosis not present

## 2021-04-01 DIAGNOSIS — I10 Essential (primary) hypertension: Secondary | ICD-10-CM | POA: Diagnosis not present

## 2021-04-01 DIAGNOSIS — E119 Type 2 diabetes mellitus without complications: Secondary | ICD-10-CM | POA: Diagnosis not present

## 2021-04-01 DIAGNOSIS — F32A Depression, unspecified: Secondary | ICD-10-CM | POA: Diagnosis not present

## 2021-04-01 DIAGNOSIS — R109 Unspecified abdominal pain: Secondary | ICD-10-CM | POA: Diagnosis not present

## 2021-04-01 DIAGNOSIS — I509 Heart failure, unspecified: Secondary | ICD-10-CM | POA: Diagnosis not present

## 2021-04-02 DIAGNOSIS — Z515 Encounter for palliative care: Secondary | ICD-10-CM | POA: Diagnosis not present

## 2021-04-02 DIAGNOSIS — G4733 Obstructive sleep apnea (adult) (pediatric): Secondary | ICD-10-CM | POA: Diagnosis not present

## 2021-04-02 DIAGNOSIS — I10 Essential (primary) hypertension: Secondary | ICD-10-CM | POA: Diagnosis not present

## 2021-04-02 DIAGNOSIS — E119 Type 2 diabetes mellitus without complications: Secondary | ICD-10-CM | POA: Diagnosis not present

## 2021-04-02 DIAGNOSIS — R109 Unspecified abdominal pain: Secondary | ICD-10-CM | POA: Diagnosis not present

## 2021-04-02 DIAGNOSIS — I509 Heart failure, unspecified: Secondary | ICD-10-CM | POA: Diagnosis not present

## 2021-04-02 DIAGNOSIS — F32A Depression, unspecified: Secondary | ICD-10-CM | POA: Diagnosis not present

## 2021-04-03 ENCOUNTER — Other Ambulatory Visit: Payer: Self-pay | Admitting: Allergy

## 2021-04-03 DIAGNOSIS — I10 Essential (primary) hypertension: Secondary | ICD-10-CM | POA: Diagnosis not present

## 2021-04-03 DIAGNOSIS — G4733 Obstructive sleep apnea (adult) (pediatric): Secondary | ICD-10-CM | POA: Diagnosis not present

## 2021-04-03 DIAGNOSIS — R109 Unspecified abdominal pain: Secondary | ICD-10-CM | POA: Diagnosis not present

## 2021-04-03 DIAGNOSIS — Z515 Encounter for palliative care: Secondary | ICD-10-CM | POA: Diagnosis not present

## 2021-04-03 DIAGNOSIS — I509 Heart failure, unspecified: Secondary | ICD-10-CM | POA: Diagnosis not present

## 2021-04-03 DIAGNOSIS — E119 Type 2 diabetes mellitus without complications: Secondary | ICD-10-CM | POA: Diagnosis not present

## 2021-04-03 DIAGNOSIS — F32A Depression, unspecified: Secondary | ICD-10-CM | POA: Diagnosis not present

## 2021-04-03 MED ORDER — ALBUTEROL SULFATE HFA 108 (90 BASE) MCG/ACT IN AERS: 2.0000 | INHALATION_SPRAY | RESPIRATORY_TRACT | 0 refills | Status: AC | PRN

## 2021-04-04 DIAGNOSIS — Z515 Encounter for palliative care: Secondary | ICD-10-CM | POA: Diagnosis not present

## 2021-04-04 DIAGNOSIS — E119 Type 2 diabetes mellitus without complications: Secondary | ICD-10-CM | POA: Diagnosis not present

## 2021-04-04 DIAGNOSIS — R109 Unspecified abdominal pain: Secondary | ICD-10-CM | POA: Diagnosis not present

## 2021-04-04 DIAGNOSIS — I509 Heart failure, unspecified: Secondary | ICD-10-CM | POA: Diagnosis not present

## 2021-04-04 DIAGNOSIS — I10 Essential (primary) hypertension: Secondary | ICD-10-CM | POA: Diagnosis not present

## 2021-04-04 DIAGNOSIS — G4733 Obstructive sleep apnea (adult) (pediatric): Secondary | ICD-10-CM | POA: Diagnosis not present

## 2021-04-04 DIAGNOSIS — F32A Depression, unspecified: Secondary | ICD-10-CM | POA: Diagnosis not present

## 2021-04-05 ENCOUNTER — Inpatient Hospital Stay (HOSPITAL_COMMUNITY)
Admission: EM | Admit: 2021-04-05 | Discharge: 2021-04-15 | DRG: 286 | Disposition: A | Discharge status: Hospice | Payer: Medicaid Other | Attending: Internal Medicine | Admitting: Internal Medicine

## 2021-04-05 ENCOUNTER — Inpatient Hospital Stay: Payer: Self-pay

## 2021-04-05 ENCOUNTER — Emergency Department (HOSPITAL_COMMUNITY): Payer: Medicaid Other

## 2021-04-05 ENCOUNTER — Encounter (HOSPITAL_COMMUNITY): Payer: Self-pay

## 2021-04-05 ENCOUNTER — Other Ambulatory Visit: Payer: Self-pay

## 2021-04-05 DIAGNOSIS — E11610 Type 2 diabetes mellitus with diabetic neuropathic arthropathy: Secondary | ICD-10-CM | POA: Diagnosis not present

## 2021-04-05 DIAGNOSIS — Z7901 Long term (current) use of anticoagulants: Secondary | ICD-10-CM

## 2021-04-05 DIAGNOSIS — Z515 Encounter for palliative care: Secondary | ICD-10-CM | POA: Diagnosis not present

## 2021-04-05 DIAGNOSIS — J45909 Unspecified asthma, uncomplicated: Secondary | ICD-10-CM | POA: Diagnosis not present

## 2021-04-05 DIAGNOSIS — T80211A Bloodstream infection due to central venous catheter, initial encounter: Secondary | ICD-10-CM | POA: Diagnosis not present

## 2021-04-05 DIAGNOSIS — N179 Acute kidney failure, unspecified: Secondary | ICD-10-CM | POA: Diagnosis not present

## 2021-04-05 DIAGNOSIS — E871 Hypo-osmolality and hyponatremia: Secondary | ICD-10-CM | POA: Diagnosis not present

## 2021-04-05 DIAGNOSIS — I5023 Acute on chronic systolic (congestive) heart failure: Secondary | ICD-10-CM | POA: Diagnosis present

## 2021-04-05 DIAGNOSIS — E1142 Type 2 diabetes mellitus with diabetic polyneuropathy: Secondary | ICD-10-CM | POA: Diagnosis not present

## 2021-04-05 DIAGNOSIS — R9431 Abnormal electrocardiogram [ECG] [EKG]: Secondary | ICD-10-CM | POA: Diagnosis not present

## 2021-04-05 DIAGNOSIS — I10 Essential (primary) hypertension: Secondary | ICD-10-CM | POA: Diagnosis not present

## 2021-04-05 DIAGNOSIS — A4181 Sepsis due to Enterococcus: Secondary | ICD-10-CM

## 2021-04-05 DIAGNOSIS — I509 Heart failure, unspecified: Secondary | ICD-10-CM

## 2021-04-05 DIAGNOSIS — Y848 Other medical procedures as the cause of abnormal reaction of the patient, or of later complication, without mention of misadventure at the time of the procedure: Secondary | ICD-10-CM | POA: Diagnosis present

## 2021-04-05 DIAGNOSIS — Z833 Family history of diabetes mellitus: Secondary | ICD-10-CM

## 2021-04-05 DIAGNOSIS — Z79899 Other long term (current) drug therapy: Secondary | ICD-10-CM

## 2021-04-05 DIAGNOSIS — Z794 Long term (current) use of insulin: Secondary | ICD-10-CM

## 2021-04-05 DIAGNOSIS — E119 Type 2 diabetes mellitus without complications: Secondary | ICD-10-CM | POA: Diagnosis not present

## 2021-04-05 DIAGNOSIS — Z20822 Contact with and (suspected) exposure to covid-19: Secondary | ICD-10-CM | POA: Diagnosis present

## 2021-04-05 DIAGNOSIS — Z66 Do not resuscitate: Secondary | ICD-10-CM | POA: Diagnosis not present

## 2021-04-05 DIAGNOSIS — I428 Other cardiomyopathies: Secondary | ICD-10-CM | POA: Diagnosis not present

## 2021-04-05 DIAGNOSIS — I4892 Unspecified atrial flutter: Secondary | ICD-10-CM | POA: Diagnosis present

## 2021-04-05 DIAGNOSIS — R0902 Hypoxemia: Secondary | ICD-10-CM | POA: Diagnosis not present

## 2021-04-05 DIAGNOSIS — E1169 Type 2 diabetes mellitus with other specified complication: Secondary | ICD-10-CM | POA: Diagnosis not present

## 2021-04-05 DIAGNOSIS — R7881 Bacteremia: Secondary | ICD-10-CM | POA: Diagnosis not present

## 2021-04-05 DIAGNOSIS — Z6841 Body Mass Index (BMI) 40.0 and over, adult: Secondary | ICD-10-CM | POA: Diagnosis not present

## 2021-04-05 DIAGNOSIS — I110 Hypertensive heart disease with heart failure: Principal | ICD-10-CM | POA: Diagnosis present

## 2021-04-05 DIAGNOSIS — Z7189 Other specified counseling: Secondary | ICD-10-CM | POA: Diagnosis not present

## 2021-04-05 DIAGNOSIS — E875 Hyperkalemia: Secondary | ICD-10-CM | POA: Diagnosis not present

## 2021-04-05 DIAGNOSIS — T827XXD Infection and inflammatory reaction due to other cardiac and vascular devices, implants and grafts, subsequent encounter: Secondary | ICD-10-CM | POA: Diagnosis not present

## 2021-04-05 DIAGNOSIS — E872 Acidosis, unspecified: Secondary | ICD-10-CM | POA: Diagnosis not present

## 2021-04-05 DIAGNOSIS — I4510 Unspecified right bundle-branch block: Secondary | ICD-10-CM | POA: Diagnosis not present

## 2021-04-05 DIAGNOSIS — E669 Obesity, unspecified: Secondary | ICD-10-CM | POA: Diagnosis not present

## 2021-04-05 DIAGNOSIS — R570 Cardiogenic shock: Secondary | ICD-10-CM | POA: Diagnosis present

## 2021-04-05 DIAGNOSIS — R109 Unspecified abdominal pain: Secondary | ICD-10-CM | POA: Diagnosis not present

## 2021-04-05 DIAGNOSIS — G4733 Obstructive sleep apnea (adult) (pediatric): Secondary | ICD-10-CM | POA: Diagnosis present

## 2021-04-05 DIAGNOSIS — R188 Other ascites: Secondary | ICD-10-CM | POA: Diagnosis present

## 2021-04-05 DIAGNOSIS — R0689 Other abnormalities of breathing: Secondary | ICD-10-CM | POA: Diagnosis not present

## 2021-04-05 DIAGNOSIS — B952 Enterococcus as the cause of diseases classified elsewhere: Secondary | ICD-10-CM | POA: Diagnosis present

## 2021-04-05 DIAGNOSIS — I517 Cardiomegaly: Secondary | ICD-10-CM | POA: Diagnosis not present

## 2021-04-05 DIAGNOSIS — R0602 Shortness of breath: Secondary | ICD-10-CM | POA: Diagnosis not present

## 2021-04-05 DIAGNOSIS — Z8249 Family history of ischemic heart disease and other diseases of the circulatory system: Secondary | ICD-10-CM

## 2021-04-05 DIAGNOSIS — I340 Nonrheumatic mitral (valve) insufficiency: Secondary | ICD-10-CM | POA: Diagnosis not present

## 2021-04-05 DIAGNOSIS — D6489 Other specified anemias: Secondary | ICD-10-CM | POA: Diagnosis not present

## 2021-04-05 DIAGNOSIS — F32A Depression, unspecified: Secondary | ICD-10-CM | POA: Diagnosis not present

## 2021-04-05 DIAGNOSIS — E876 Hypokalemia: Secondary | ICD-10-CM | POA: Diagnosis not present

## 2021-04-05 DIAGNOSIS — I5084 End stage heart failure: Secondary | ICD-10-CM | POA: Diagnosis present

## 2021-04-05 DIAGNOSIS — I4891 Unspecified atrial fibrillation: Secondary | ICD-10-CM | POA: Diagnosis not present

## 2021-04-05 DIAGNOSIS — I447 Left bundle-branch block, unspecified: Secondary | ICD-10-CM | POA: Diagnosis not present

## 2021-04-05 DIAGNOSIS — E1165 Type 2 diabetes mellitus with hyperglycemia: Secondary | ICD-10-CM | POA: Diagnosis present

## 2021-04-05 DIAGNOSIS — I38 Endocarditis, valve unspecified: Secondary | ICD-10-CM | POA: Diagnosis not present

## 2021-04-05 DIAGNOSIS — B379 Candidiasis, unspecified: Secondary | ICD-10-CM | POA: Diagnosis present

## 2021-04-05 DIAGNOSIS — L299 Pruritus, unspecified: Secondary | ICD-10-CM | POA: Diagnosis present

## 2021-04-05 DIAGNOSIS — B957 Other staphylococcus as the cause of diseases classified elsewhere: Secondary | ICD-10-CM | POA: Diagnosis present

## 2021-04-05 HISTORY — DX: Heart failure, unspecified: I50.9

## 2021-04-05 LAB — I-STAT CHEM 8, ED
BUN: 34 mg/dL — ABNORMAL HIGH (ref 6–20)
Calcium, Ion: 1.1 mmol/L — ABNORMAL LOW (ref 1.15–1.40)
Chloride: 102 mmol/L (ref 98–111)
Creatinine, Ser: 2.2 mg/dL — ABNORMAL HIGH (ref 0.61–1.24)
Glucose, Bld: 245 mg/dL — ABNORMAL HIGH (ref 70–99)
HCT: 34 % — ABNORMAL LOW (ref 39.0–52.0)
Hemoglobin: 11.6 g/dL — ABNORMAL LOW (ref 13.0–17.0)
Potassium: 6.6 mmol/L (ref 3.5–5.1)
Sodium: 123 mmol/L — ABNORMAL LOW (ref 135–145)
TCO2: 22 mmol/L (ref 22–32)

## 2021-04-05 LAB — COOXEMETRY PANEL
Carboxyhemoglobin: <0.3 % — ABNORMAL LOW (ref 0.5–1.5)
Carboxyhemoglobin: 1.5 % (ref 0.5–1.5)
Methemoglobin: 2.8 % — ABNORMAL HIGH (ref 0.0–1.5)
Methemoglobin: <0.7 % (ref 0.0–1.5)
O2 Saturation: 52.7 %
O2 Saturation: 46.3 %
Total hemoglobin: 12.5 g/dL (ref 12.0–16.0)
Total hemoglobin: 11.5 g/dL — ABNORMAL LOW (ref 12.0–16.0)

## 2021-04-05 LAB — CBC WITH DIFFERENTIAL/PLATELET
Abs Immature Granulocytes: 0.1 10*3/uL — ABNORMAL HIGH (ref 0.00–0.07)
Basophils Absolute: 0 10*3/uL (ref 0.0–0.1)
Basophils Relative: 0 %
Eosinophils Absolute: 0 10*3/uL (ref 0.0–0.5)
Eosinophils Relative: 0 %
HCT: 35 % — ABNORMAL LOW (ref 39.0–52.0)
Hemoglobin: 11.2 g/dL — ABNORMAL LOW (ref 13.0–17.0)
Immature Granulocytes: 1 %
Lymphocytes Relative: 8 %
Lymphs Abs: 1.2 10*3/uL (ref 0.7–4.0)
MCH: 30.7 pg (ref 26.0–34.0)
MCHC: 32 g/dL (ref 30.0–36.0)
MCV: 95.9 fL (ref 80.0–100.0)
Monocytes Absolute: 1.1 10*3/uL — ABNORMAL HIGH (ref 0.1–1.0)
Monocytes Relative: 7 %
Neutro Abs: 12.5 10*3/uL — ABNORMAL HIGH (ref 1.7–7.7)
Neutrophils Relative %: 84 %
Platelets: 337 10*3/uL (ref 150–400)
RBC: 3.65 MIL/uL — ABNORMAL LOW (ref 4.22–5.81)
RDW: 14.2 % (ref 11.5–15.5)
WBC: 14.9 10*3/uL — ABNORMAL HIGH (ref 4.0–10.5)
nRBC: 0 % (ref 0.0–0.2)

## 2021-04-05 LAB — IRON AND TIBC
Iron: 29 ug/dL — ABNORMAL LOW (ref 45–182)
Saturation Ratios: 8 % — ABNORMAL LOW (ref 17.9–39.5)
TIBC: 363 ug/dL (ref 250–450)
UIBC: 334 ug/dL

## 2021-04-05 LAB — COMPREHENSIVE METABOLIC PANEL
ALT: 22 U/L (ref 0–44)
AST: 21 U/L (ref 15–41)
Albumin: 3.2 g/dL — ABNORMAL LOW (ref 3.5–5.0)
Alkaline Phosphatase: 89 U/L (ref 38–126)
Anion gap: 11 (ref 5–15)
BUN: 35 mg/dL — ABNORMAL HIGH (ref 6–20)
CO2: 19 mmol/L — ABNORMAL LOW (ref 22–32)
Calcium: 8.7 mg/dL — ABNORMAL LOW (ref 8.9–10.3)
Chloride: 93 mmol/L — ABNORMAL LOW (ref 98–111)
Creatinine, Ser: 2.14 mg/dL — ABNORMAL HIGH (ref 0.61–1.24)
GFR, Estimated: 40 mL/min — ABNORMAL LOW (ref >60)
Glucose, Bld: 257 mg/dL — ABNORMAL HIGH (ref 70–99)
Potassium: 6.5 mmol/L (ref 3.5–5.1)
Sodium: 123 mmol/L — ABNORMAL LOW (ref 135–145)
Total Bilirubin: 0.5 mg/dL (ref 0.3–1.2)
Total Protein: 8.2 g/dL — ABNORMAL HIGH (ref 6.5–8.1)

## 2021-04-05 LAB — URINALYSIS, ROUTINE W REFLEX MICROSCOPIC
Bilirubin Urine: NEGATIVE
Glucose, UA: NEGATIVE mg/dL
Hgb urine dipstick: NEGATIVE
Ketones, ur: NEGATIVE mg/dL
Leukocytes,Ua: NEGATIVE
Nitrite: NEGATIVE
Protein, ur: NEGATIVE mg/dL
Specific Gravity, Urine: 1.006 (ref 1.005–1.030)
pH: 5 (ref 5.0–8.0)

## 2021-04-05 LAB — GLUCOSE, CAPILLARY
Glucose-Capillary: 189 mg/dL — ABNORMAL HIGH (ref 70–99)
Glucose-Capillary: 172 mg/dL — ABNORMAL HIGH (ref 70–99)

## 2021-04-05 LAB — BLOOD CULTURE ID PANEL (REFLEXED) - BCID2
A.calcoaceticus-baumannii: NOT DETECTED
Bacteroides fragilis: NOT DETECTED
Candida albicans: NOT DETECTED
Candida auris: NOT DETECTED
Candida glabrata: NOT DETECTED
Candida krusei: NOT DETECTED
Candida parapsilosis: NOT DETECTED
Candida tropicalis: NOT DETECTED
Cryptococcus neoformans/gattii: NOT DETECTED
Enterobacter cloacae complex: NOT DETECTED
Enterobacterales: NOT DETECTED
Enterococcus Faecium: NOT DETECTED
Enterococcus faecalis: DETECTED — AB
Escherichia coli: NOT DETECTED
Haemophilus influenzae: NOT DETECTED
Klebsiella aerogenes: NOT DETECTED
Klebsiella oxytoca: NOT DETECTED
Klebsiella pneumoniae: NOT DETECTED
Listeria monocytogenes: NOT DETECTED
Methicillin resistance mecA/C: NOT DETECTED
Neisseria meningitidis: NOT DETECTED
Proteus species: NOT DETECTED
Pseudomonas aeruginosa: NOT DETECTED
Salmonella species: NOT DETECTED
Serratia marcescens: NOT DETECTED
Staphylococcus aureus (BCID): NOT DETECTED
Staphylococcus epidermidis: DETECTED — AB
Staphylococcus lugdunensis: NOT DETECTED
Staphylococcus species: DETECTED — AB
Stenotrophomonas maltophilia: NOT DETECTED
Streptococcus agalactiae: NOT DETECTED
Streptococcus pneumoniae: NOT DETECTED
Streptococcus pyogenes: NOT DETECTED
Streptococcus species: NOT DETECTED
Vancomycin resistance: NOT DETECTED

## 2021-04-05 LAB — RESP PANEL BY RT-PCR (FLU A&B, COVID) ARPGX2
Influenza A by PCR: NEGATIVE
Influenza B by PCR: NEGATIVE
SARS Coronavirus 2 by RT PCR: NEGATIVE

## 2021-04-05 LAB — MAGNESIUM: Magnesium: 1.7 mg/dL (ref 1.7–2.4)

## 2021-04-05 LAB — LACTIC ACID, PLASMA
Lactic Acid, Venous: 3.2 mmol/L (ref 0.5–1.9)
Lactic Acid, Venous: 2.1 mmol/L (ref 0.5–1.9)

## 2021-04-05 LAB — TROPONIN I (HIGH SENSITIVITY)
Troponin I (High Sensitivity): 148 ng/L (ref <18)
Troponin I (High Sensitivity): 169 ng/L (ref <18)

## 2021-04-05 LAB — HEMOGLOBIN A1C
Hgb A1c MFr Bld: 8.8 % — ABNORMAL HIGH (ref 4.8–5.6)
Mean Plasma Glucose: 205.86 mg/dL

## 2021-04-05 LAB — BASIC METABOLIC PANEL
Anion gap: 12 (ref 5–15)
BUN: 33 mg/dL — ABNORMAL HIGH (ref 6–20)
CO2: 23 mmol/L (ref 22–32)
Calcium: 8.8 mg/dL — ABNORMAL LOW (ref 8.9–10.3)
Chloride: 92 mmol/L — ABNORMAL LOW (ref 98–111)
Creatinine, Ser: 1.74 mg/dL — ABNORMAL HIGH (ref 0.61–1.24)
GFR, Estimated: 52 mL/min — ABNORMAL LOW (ref >60)
Glucose, Bld: 274 mg/dL — ABNORMAL HIGH (ref 70–99)
Potassium: 4.4 mmol/L (ref 3.5–5.1)
Sodium: 127 mmol/L — ABNORMAL LOW (ref 135–145)

## 2021-04-05 LAB — BRAIN NATRIURETIC PEPTIDE: B Natriuretic Peptide: 460.5 pg/mL — ABNORMAL HIGH (ref 0.0–100.0)

## 2021-04-05 LAB — PROTIME-INR
INR: 1.6 — ABNORMAL HIGH (ref 0.8–1.2)
Prothrombin Time: 18.7 seconds — ABNORMAL HIGH (ref 11.4–15.2)

## 2021-04-05 LAB — CBG MONITORING, ED
Glucose-Capillary: 182 mg/dL — ABNORMAL HIGH (ref 70–99)
Glucose-Capillary: 213 mg/dL — ABNORMAL HIGH (ref 70–99)

## 2021-04-05 LAB — FERRITIN: Ferritin: 311 ng/mL (ref 24–336)

## 2021-04-05 LAB — VITAMIN B12: Vitamin B-12: 409 pg/mL (ref 180–914)

## 2021-04-05 MED ORDER — HYDROXYZINE HCL 25 MG PO TABS
25.0000 mg | ORAL_TABLET | Freq: Once | ORAL | Status: AC
  Administered 2021-04-05: 25 mg via ORAL
  Filled 2021-04-05: qty 1

## 2021-04-05 MED ORDER — ISOSORB DINITRATE-HYDRALAZINE 20-37.5 MG PO TABS
1.0000 | ORAL_TABLET | Freq: Three times a day (TID) | ORAL | Status: DC
  Administered 2021-04-05 – 2021-04-15 (×32): 1 via ORAL
  Filled 2021-04-05 (×34): qty 1

## 2021-04-05 MED ORDER — LORAZEPAM 2 MG/ML IJ SOLN
0.5000 mg | Freq: Once | INTRAMUSCULAR | Status: AC
  Administered 2021-04-05: 0.5 mg via INTRAVENOUS
  Filled 2021-04-05: qty 1

## 2021-04-05 MED ORDER — SPIRONOLACTONE 12.5 MG HALF TABLET
12.5000 mg | ORAL_TABLET | Freq: Every day | ORAL | Status: DC
Start: 2021-04-05 — End: 2021-04-07
  Administered 2021-04-05 – 2021-04-07 (×3): 12.5 mg via ORAL
  Filled 2021-04-05 (×3): qty 1

## 2021-04-05 MED ORDER — ACETAMINOPHEN 650 MG RE SUPP: 650.0000 mg | Freq: Four times a day (QID) | RECTAL | Status: DC | PRN

## 2021-04-05 MED ORDER — AMIODARONE HCL 100 MG PO TABS
100.0000 mg | ORAL_TABLET | Freq: Every day | ORAL | Status: DC
  Administered 2021-04-05 – 2021-04-15 (×11): 100 mg via ORAL
  Filled 2021-04-05 (×11): qty 1

## 2021-04-05 MED ORDER — FUROSEMIDE 10 MG/ML IJ SOLN
12.0000 mg/h | INTRAVENOUS | Status: DC
  Administered 2021-04-05: 12 mg/h via INTRAVENOUS
  Filled 2021-04-05 (×2): qty 20

## 2021-04-05 MED ORDER — SODIUM CHLORIDE 0.9 % IV SOLN
1.0000 g | Freq: Four times a day (QID) | INTRAVENOUS | Status: DC
  Administered 2021-04-05 – 2021-04-06 (×2): 1 g via INTRAVENOUS
  Filled 2021-04-05 (×4): qty 1000

## 2021-04-05 MED ORDER — FUROSEMIDE 10 MG/ML IJ SOLN
120.0000 mg | Freq: Four times a day (QID) | INTRAVENOUS | Status: DC
  Filled 2021-04-05 (×3): qty 12

## 2021-04-05 MED ORDER — INSULIN ASPART 100 UNIT/ML IJ SOLN
0.0000 [IU] | Freq: Three times a day (TID) | INTRAMUSCULAR | Status: DC
  Administered 2021-04-05 (×2): 3 [IU] via SUBCUTANEOUS
  Administered 2021-04-05: 5 [IU] via SUBCUTANEOUS
  Administered 2021-04-06: 08:00:00 3 [IU] via SUBCUTANEOUS
  Administered 2021-04-06: 5 [IU] via SUBCUTANEOUS
  Administered 2021-04-07: 18:00:00 3 [IU] via SUBCUTANEOUS
  Administered 2021-04-07: 06:00:00 5 [IU] via SUBCUTANEOUS
  Administered 2021-04-07: 12:00:00 3 [IU] via SUBCUTANEOUS
  Administered 2021-04-08: 8 [IU] via SUBCUTANEOUS
  Administered 2021-04-08: 5 [IU] via SUBCUTANEOUS
  Administered 2021-04-08 – 2021-04-10 (×4): 3 [IU] via SUBCUTANEOUS
  Administered 2021-04-10: 2 [IU] via SUBCUTANEOUS
  Administered 2021-04-11 – 2021-04-12 (×4): 3 [IU] via SUBCUTANEOUS
  Administered 2021-04-13: 2 [IU] via SUBCUTANEOUS
  Administered 2021-04-13 – 2021-04-15 (×5): 3 [IU] via SUBCUTANEOUS

## 2021-04-05 MED ORDER — DEXTROSE 50 % IV SOLN
1.0000 | Freq: Once | INTRAVENOUS | Status: AC
  Administered 2021-04-05: 50 mL via INTRAVENOUS
  Filled 2021-04-05: qty 50

## 2021-04-05 MED ORDER — DIGOXIN 125 MCG PO TABS
125.0000 ug | ORAL_TABLET | Freq: Every day | ORAL | Status: DC
  Administered 2021-04-05 – 2021-04-15 (×11): 125 ug via ORAL
  Filled 2021-04-05 (×11): qty 1

## 2021-04-05 MED ORDER — SODIUM ZIRCONIUM CYCLOSILICATE 10 G PO PACK
10.0000 g | PACK | Freq: Once | ORAL | Status: AC
  Administered 2021-04-05: 10 g via ORAL
  Filled 2021-04-05: qty 1

## 2021-04-05 MED ORDER — APIXABAN 5 MG PO TABS
5.0000 mg | ORAL_TABLET | Freq: Two times a day (BID) | ORAL | Status: DC
Start: 2021-04-05 — End: 2021-04-09
  Administered 2021-04-05 – 2021-04-08 (×8): 5 mg via ORAL
  Filled 2021-04-05 (×8): qty 1

## 2021-04-05 MED ORDER — MILRINONE LACTATE IN DEXTROSE 20-5 MG/100ML-% IV SOLN
0.3750 ug/kg/min | INTRAVENOUS | Status: DC
  Administered 2021-04-05 (×2): 0.375 ug/kg/min via INTRAVENOUS
  Administered 2021-04-05 – 2021-04-12 (×39): 0.5 ug/kg/min via INTRAVENOUS
  Administered 2021-04-13: 0.375 ug/kg/min via INTRAVENOUS
  Administered 2021-04-13: 0.5 ug/kg/min via INTRAVENOUS
  Administered 2021-04-13: 0.375 ug/kg/min via INTRAVENOUS
  Administered 2021-04-13: 0.5 ug/kg/min via INTRAVENOUS
  Administered 2021-04-13 – 2021-04-15 (×6): 0.375 ug/kg/min via INTRAVENOUS
  Filled 2021-04-05 (×12): qty 100
  Filled 2021-04-05: qty 200
  Filled 2021-04-05 (×29): qty 100
  Filled 2021-04-05: qty 200
  Filled 2021-04-05 (×10): qty 100

## 2021-04-05 MED ORDER — SODIUM CHLORIDE 0.9% FLUSH
3.0000 mL | Freq: Two times a day (BID) | INTRAVENOUS | Status: DC
  Administered 2021-04-06 – 2021-04-10 (×7): 3 mL via INTRAVENOUS

## 2021-04-05 MED ORDER — ACETAMINOPHEN 325 MG PO TABS
650.0000 mg | ORAL_TABLET | Freq: Four times a day (QID) | ORAL | Status: DC | PRN
  Administered 2021-04-06 – 2021-04-09 (×6): 650 mg via ORAL
  Filled 2021-04-05 (×6): qty 2

## 2021-04-05 MED ORDER — FUROSEMIDE 10 MG/ML IJ SOLN
120.0000 mg | Freq: Once | INTRAVENOUS | Status: AC
  Administered 2021-04-05: 120 mg via INTRAVENOUS
  Filled 2021-04-05: qty 10

## 2021-04-05 MED ORDER — CALCIUM GLUCONATE 10 % IV SOLN
1.0000 g | Freq: Once | INTRAVENOUS | Status: AC
  Administered 2021-04-05: 1 g via INTRAVENOUS
  Filled 2021-04-05: qty 10

## 2021-04-05 MED ORDER — INSULIN ASPART 100 UNIT/ML IJ SOLN
0.0000 [IU] | Freq: Every day | INTRAMUSCULAR | Status: DC
  Administered 2021-04-06: 22:00:00 3 [IU] via SUBCUTANEOUS
  Administered 2021-04-07 – 2021-04-14 (×2): 2 [IU] via SUBCUTANEOUS

## 2021-04-05 MED ORDER — FUROSEMIDE 10 MG/ML IJ SOLN
80.0000 mg | INTRAMUSCULAR | Status: DC
  Administered 2021-04-06 – 2021-04-14 (×49): 80 mg via INTRAVENOUS
  Filled 2021-04-05 (×49): qty 8

## 2021-04-05 MED ORDER — INSULIN ASPART 100 UNIT/ML IV SOLN
5.0000 [IU] | Freq: Once | INTRAVENOUS | Status: AC
  Administered 2021-04-05: 5 [IU] via INTRAVENOUS

## 2021-04-05 MED ORDER — SENNOSIDES-DOCUSATE SODIUM 8.6-50 MG PO TABS: 1.0000 | ORAL_TABLET | Freq: Every evening | ORAL | Status: DC | PRN

## 2021-04-05 MED ORDER — SODIUM CHLORIDE 0.9 % IV SOLN
2.0000 g | Freq: Two times a day (BID) | INTRAVENOUS | Status: DC
  Administered 2021-04-05 – 2021-04-08 (×7): 2 g via INTRAVENOUS
  Filled 2021-04-05 (×7): qty 20

## 2021-04-05 NOTE — Progress Notes (Signed)
PHARMACY - PHYSICIAN COMMUNICATION CRITICAL VALUE ALERT - BLOOD CULTURE IDENTIFICATION (BCID)  Jonathan [name] is an 36 y.o. male who presented to [facility] on 04/05/2021 with a chief complaint of acute on chronic HF, leukocytosis  Assessment:   Blood cultures positive 2/2 for e. Faecalis and staph epi (no resistance markers)  Current antibiotics: none  Changes to prescribed antibiotics recommended:  Discussed with provider, who is re-evaluating patient for signs/symptoms of infection Recommend adding ampicillin 2gm q4hr if infection suspected Follow up cultures for further changes in therapy  Results for orders placed or performed during the hospital encounter of 04/05/21  Blood Culture ID Panel (Reflexed) (Collected: 04/05/2021  2:14 AM)  Result Value Ref Range   Enterococcus faecalis DETECTED (A) NOT DETECTED   Enterococcus Faecium NOT DETECTED NOT DETECTED   Listeria monocytogenes NOT DETECTED NOT DETECTED   Staphylococcus species DETECTED (A) NOT DETECTED   Staphylococcus aureus (BCID) NOT DETECTED NOT DETECTED   Staphylococcus epidermidis DETECTED (A) NOT DETECTED   Staphylococcus lugdunensis NOT DETECTED NOT DETECTED   Streptococcus species NOT DETECTED NOT DETECTED   Streptococcus agalactiae NOT DETECTED NOT DETECTED   Streptococcus pneumoniae NOT DETECTED NOT DETECTED   Streptococcus pyogenes NOT DETECTED NOT DETECTED   A.calcoaceticus-baumannii NOT DETECTED NOT DETECTED   Bacteroides fragilis NOT DETECTED NOT DETECTED   Enterobacterales NOT DETECTED NOT DETECTED   Enterobacter cloacae complex NOT DETECTED NOT DETECTED   Escherichia coli NOT DETECTED NOT DETECTED   Klebsiella aerogenes NOT DETECTED NOT DETECTED   Klebsiella oxytoca NOT DETECTED NOT DETECTED   Klebsiella pneumoniae NOT DETECTED NOT DETECTED   Proteus species NOT DETECTED NOT DETECTED   Salmonella species NOT DETECTED NOT DETECTED   Serratia marcescens NOT DETECTED NOT DETECTED   Haemophilus influenzae  NOT DETECTED NOT DETECTED   Neisseria meningitidis NOT DETECTED NOT DETECTED   Pseudomonas aeruginosa NOT DETECTED NOT DETECTED   Stenotrophomonas maltophilia NOT DETECTED NOT DETECTED   Candida albicans NOT DETECTED NOT DETECTED   Candida auris NOT DETECTED NOT DETECTED   Candida glabrata NOT DETECTED NOT DETECTED   Candida krusei NOT DETECTED NOT DETECTED   Candida parapsilosis NOT DETECTED NOT DETECTED   Candida tropicalis NOT DETECTED NOT DETECTED   Cryptococcus neoformans/gattii NOT DETECTED NOT DETECTED   Methicillin resistance mecA/C NOT DETECTED NOT DETECTED   Vancomycin resistance NOT DETECTED NOT DETECTED   Thank you for allowing pharmacy to be a part of this patients care.  Tomie China, PharmD Clinical Pharmacist  Please check AMION for all [facility] Pharmacy numbers After 10:00 PM, call Main Pharmacy [phone]

## 2021-04-05 NOTE — Consult Note (Signed)
[facility]    Date of Admission:  04/05/2021     Reason for Consult: Enterococcus faecalis bacteremia     Referring Physician: [facility]  Current antibiotics: None given (Ampicillin has been ordered)    ASSESSMENT:    36 y.o. male admitted with:  Enterococcus faecalis and Staph epidermidis bacteremia: Difficult to interpret significance of cultures as they appear to have been drawn from his PICC line.  May represent colonization of his line vs true infection.  His presentation appears c/w cardiogenic shock, but sepsis cannot be definitively ruled out at this time.  Unfortunately, he is declining further blood cultures to evaluate and further PIV access. Acute on chronic end stage HF Acute kidney injury Leukocytosis/Lactic acidosis: Raises the concern for possible sepsis. Type 2 DM: Recent A1c 8.3.  RECOMMENDATIONS:    Agree with PICC line removal since at the very least his line is colonized and represents a nidus of future infection or could be source of true bacteremia Repeat blood cultures if he will allow and PIV access for now Echo Agree with ampicillin Will add ceftriaxone for empiric endocarditis therapy since he has a long term central line and could be high risk for endocarditis.  Ceftriaxone should also cover the Staph epi from his culture. Will follow.   Active Problems:   Diabetes mellitus type 2 in obese [facility])   Essential hypertension   Acute exacerbation of CHF (congestive heart failure) (HCC)   Hyponatremia with excess extracellular fluid volume   MEDICATIONS:    Scheduled Meds:  amiodarone  100 mg Oral Daily   apixaban  5 mg Oral BID   digoxin  125 mcg Oral Daily   insulin aspart  0-15 Units Subcutaneous TID WC   insulin aspart  0-5 Units Subcutaneous QHS   isosorbide-hydrALAZINE  1 tablet Oral TID   sodium chloride flush  3 mL Intravenous Q12H   spironolactone  12.5 mg Oral Daily   Continuous Infusions:   ampicillin (OMNIPEN) IV     furosemide (LASIX) 200 mg in dextrose 5% 100 mL (2mg /mL) infusion 12 mg/hr (04/05/21 1139)   milrinone 0.5 mcg/kg/min (04/05/21 1240)   PRN Meds:.acetaminophen **OR** acetaminophen, senna-docusate  HPI:    Jonathan [name] is a 36 y.o. male with a past medical history of severe nonischemic cardiomyopathy, currently on chronic outpatient milrinone through a right upper extremity PICC line, type 2 diabetes, obesity (Body mass index is 46.59 kg/m.), CKD  who presented to the [facility] emergency department last night with dyspnea on exertion and difficulty moving over the past 3 to 4 days prior to admission.  He did not have any infectious symptoms such as fevers or chills that he recalls.  He said that his PICC line has been a little sluggish lately.  He was seen by the IV team on 03/27/2021 where he had tPA administered into his line.  It was noted at that time that they were able to remove blood sluggishly with some difficulty flushing.  No kinking was noted at the insertion site.  This did not change with arm position.  Patient declined further treatment at that time.    His labs on admission this morning showed a leukocytosis of 14.9, potassium 6.5, creatinine 2.1, hyperglycemia, lactic acidosis.  For these reasons a blood culture was obtained which he reports was drawn from his PICC line as he declined further peripheral sticks.  This culture is marked as blood not specified.  It is  growing gram-positive cocci in pairs and chains.  BC ID has detected Staphylococcus epidermidis and Enterococcus faecalis.  There is no resistance predicted.  On repeat lab work his lactic acidosis had improved.  He was seen by the heart failure team in consultation earlier today where he was noted to have marked volume overload coinciding with weight gain at home and Co. ox 52%. This was c/w NYHA class IV symptoms with cardiogenic shock.  We have been auto consulted due to E faecalis bacteremia.     Past Medical History:  Diagnosis Date   Acute pain of left foot 05/03/2017   Asthma    Asthma, chronic, mild persistent, uncomplicated 10/26/2016   Charcot ankle, left 05/31/2017   CHF (congestive heart failure) (HCC)    Chronic systolic (congestive) heart failure (HCC)    Diabetes mellitus type 2 in obese (HCC) 04/21/2014   Diabetic polyneuropathy associated with type 2 diabetes mellitus (HCC) 03/14/2017   Essential hypertension 10/26/2016   Hypertension    Obesity    Obesity, Class III, BMI 40-49.9 (morbid obesity) (HCC) 10/26/2016   OSA (obstructive sleep apnea) 12/02/2016   Type 2 diabetes mellitus with diabetic neuropathy (HCC)     Social History   Tobacco Use   Smoking status: Never   Smokeless tobacco: Never  Vaping Use   Vaping Use: Never used  Substance Use Topics   Alcohol use: No   Drug use: No    Family History  Problem Relation Age of Onset   Diabetes Mellitus II Mother    Heart failure Mother    Hypertension Mother    Heart disease Mother    Heart failure Father    Kidney disease Father    Heart disease Father    Congestive Heart Failure Neg Hx     Allergies  Allergen Reactions   Hydrocodone     Hives     Review of Systems  All other systems reviewed and are negative. Except as noted above.   OBJECTIVE:   Blood pressure (!) 143/92, pulse (!) 111, temperature 98 F (36.7 C), temperature source Oral, resp. rate 20, height 6\' 2"  (1.88 m), weight (!) 164.6 kg, SpO2 97 %. Body mass index is 46.59 kg/m.  Physical Exam Constitutional:      General: He is not in acute distress.    Appearance: Normal appearance. He is obese.  HENT:     Head: Normocephalic and atraumatic.  Eyes:     General: No scleral icterus.    Extraocular Movements: Extraocular movements intact.     Conjunctiva/sclera: Conjunctivae normal.  Cardiovascular:     Rate and Rhythm: Normal rate and regular rhythm.  Pulmonary:     Effort: Pulmonary effort is normal. No  respiratory distress.  Abdominal:     General: There is no distension.     Palpations: Abdomen is soft.     Tenderness: There is no abdominal tenderness.  Musculoskeletal:        General: Normal range of motion.     Cervical back: Normal range of motion and neck supple.     Comments: Right UE PICC line in place.   Skin:    General: Skin is warm and dry.  Neurological:     General: No focal deficit present.     Mental Status: He is alert and oriented to person, place, and time.  Psychiatric:        Mood and Affect: Mood normal.        Behavior: Behavior normal.  Lab Results: Lab Results  Component Value Date   WBC 14.9 (H) 04/05/2021   HGB 11.6 (L) 04/05/2021   HCT 34.0 (L) 04/05/2021   MCV 95.9 04/05/2021   PLT 337 04/05/2021    Lab Results  Component Value Date   NA 127 (L) 04/05/2021   K 4.4 04/05/2021   CO2 23 04/05/2021   GLUCOSE 274 (H) 04/05/2021   BUN 33 (H) 04/05/2021   CREATININE 1.74 (H) 04/05/2021   CALCIUM 8.8 (L) 04/05/2021   GFRNONAA 52 (L) 04/05/2021   GFRAA >60 11/09/2019    Lab Results  Component Value Date   ALT 22 04/05/2021   AST 21 04/05/2021   ALKPHOS 89 04/05/2021   BILITOT 0.5 04/05/2021    No results found for: CRP  No results found for: ESRSEDRATE  I have reviewed the micro and lab results in Epic.  Imaging: DG Chest Portable 1 View  Result Date: 04/05/2021 CLINICAL DATA:  Shortness of breath. EXAM: PORTABLE CHEST 1 VIEW COMPARISON:  10/28/2020. FINDINGS: The heart is enlarged and the mediastinal contour is stable. Interstitial prominence is noted in the mid to lower lung fields bilaterally, unchanged from the prior exam and may be chronic. No consolidation, effusion, or pneumothorax. No acute osseous abnormality. A right PICC line terminates over the anticipated region of the SVC. IMPRESSION: 1. Cardiomegaly. 2. Stable interstitial prominence in the mid to lower lung fields bilaterally, possible chronic atelectasis or scarring.  Electronically Signed   By: Brett Fairy M.D.   On: 04/05/2021 02:15   [place] EKG SITE RITE  Result Date: 04/05/2021 If Site Rite image not attached, placement could not be confirmed due to current cardiac rhythm.    Imaging independently reviewed in Epic.  [facility] [phone] pager 04/05/2021, 4:09 PM

## 2021-04-05 NOTE — Evaluation (Signed)
Physical Therapy Evaluation Patient Details Name: Jonathan [name] MRN: [id number] DOB: 1985/04/14 Today's Date: 04/05/2021  History of Present Illness  36 y.o. male presents to [facility] hospital on [id number] with DOE and difficulty moving. Pt admitted for management of acute on chronic end-stage heart failure. PMH includes DMII, obesity, HTN, OSA.  Clinical Impression  Pt presents to PT with deficits in cardiopulmonary function, endurance, gait, balance, power, strength. Pt continues to report weakness, although improved from time of arrival to ED. Pt is able to ambulate for household distances at this time, demonstrating increased work of breathing. Pt will benefit from continued acute PT services to aide in improving activity tolerance and restoring his prior level of function.       Recommendations for follow up therapy are one component of a multi-disciplinary discharge planning process, led by the attending physician.  Recommendations may be updated based on patient status, additional functional criteria and insurance authorization.  Follow Up Recommendations No PT follow up    Assistance Recommended at Discharge PRN  Patient can return home with the following  A little help with walking and/or transfers;A little help with bathing/dressing/bathroom;Assistance with cooking/housework    Equipment Recommendations None recommended by PT  Recommendations for Other Services       Functional Status Assessment Patient has had a recent decline in their functional status and demonstrates the ability to make significant improvements in function in a reasonable and predictable amount of time.     Precautions / Restrictions Precautions Precautions: Fall Precaution Comments: monitor SpO2 Restrictions Weight Bearing Restrictions: No      Mobility  Bed Mobility Overal bed mobility: Needs Assistance Bed Mobility: Supine to Sit     Supine to sit: Supervision          Transfers Overall  transfer level: Needs assistance Equipment used: None Transfers: Sit to/from Stand Sit to Stand: Supervision                Ambulation/Gait Ambulation/Gait assistance: Supervision Gait Distance (Feet): 150 Feet Assistive device: None Gait Pattern/deviations: Step-through pattern, Wide base of support Gait velocity: functional Gait velocity interpretation: 1.31 - 2.62 ft/sec, indicative of limited community ambulator   General Gait Details: pt with steady step-through gait, widened BOS with mild increase in lateral sway  Stairs            Wheelchair Mobility    Modified Rankin (Stroke Patients Only)       Balance Overall balance assessment: Needs assistance Sitting-balance support: No upper extremity supported, Feet supported Sitting balance-Leahy Scale: Good     Standing balance support: No upper extremity supported, During functional activity Standing balance-Leahy Scale: Fair                               Pertinent Vitals/Pain Pain Assessment Pain Assessment: No/denies pain    Home Living Family/patient expects to be discharged to:: Private residence Living Arrangements: Spouse/significant other Available Help at Discharge: Family;Available PRN/intermittently Type of Home: House Home Access: Level entry       Home Layout: One level Home Equipment: Conservation officer, nature (2 wheels);BSC/3in1      Prior Function Prior Level of Function : Independent/Modified Independent                     Hand Dominance   Dominant Hand: Right    Extremity/Trunk Assessment   Upper Extremity Assessment Upper Extremity Assessment: Overall WFL for tasks assessed  Lower Extremity Assessment Lower Extremity Assessment: Generalized weakness    Cervical / Trunk Assessment Cervical / Trunk Assessment: Other exceptions Cervical / Trunk Exceptions: excess body habitus  Communication   Communication: No difficulties  Cognition Arousal/Alertness:  Lethargic (pt asleep upon PT arrival) Behavior During Therapy: WFL for tasks assessed/performed Overall Cognitive Status: Within Functional Limits for tasks assessed                                          General Comments General comments (skin integrity, edema, etc.): pt on 2L [place] upon PT arrival, PT weans to room air for mobility, sats at end of ambulation with difficulty obtaining accurate reading initially, once reading accurately sats in mid 90s. PT does note increased work of breathing although pt denies SOB    Exercises     Assessment/Plan    PT Assessment Patient needs continued PT services  PT Problem List Cardiopulmonary status limiting activity;Decreased strength;Decreased balance;Decreased activity tolerance;Decreased mobility       PT Treatment Interventions Gait training;Functional mobility training;Therapeutic exercise;Therapeutic activities;Balance training;Patient/family education    PT Goals (Current goals can be found in the Care Plan section)  Acute Rehab PT Goals Patient Stated Goal: to return to independence and go home PT Goal Formulation: With patient Time For Goal Achievement: 04/18/21 Potential to Achieve Goals: Good Additional Goals Additional Goal #1: Pt will ambulate for >250' reporting a DOE of 3/10 or less to demonstrate improved activity tolerance    Frequency Min 3X/week     Co-evaluation               AM-PAC PT "6 Clicks" Mobility  Outcome Measure Help needed turning from your back to your side while in a flat bed without using bedrails?: A Little Help needed moving from lying on your back to sitting on the side of a flat bed without using bedrails?: A Little Help needed moving to and from a bed to a chair (including a wheelchair)?: A Little Help needed standing up from a chair using your arms (e.g., wheelchair or bedside chair)?: A Little Help needed to walk in hospital room?: A Little Help needed climbing 3-5 steps  with a railing? : A Little 6 Click Score: 18    End of Session   Activity Tolerance: Patient tolerated treatment well Patient left: in bed;with call bell/phone within reach;with family/visitor present;with nursing/sitter in room Nurse Communication: Mobility status PT Visit Diagnosis: Other abnormalities of gait and mobility (R26.89)    Time: [id number] PT Time Calculation (min) (ACUTE ONLY): 17 min   Charges:   PT Evaluation $PT Eval Low Complexity: Lester, PT, DPT Acute Rehabilitation Pager: [phone] Office [phone]   Zenaida Niece 04/05/2021, 10:08 AM

## 2021-04-05 NOTE — Consult Note (Signed)
Advanced Heart Failure Team Consult Note   Primary Physician: [name], MD PCP-Cardiologist:  Fransico Him, MD  Reason for Consultation: CHF  HPI:    Jonathan [name] is seen today for evaluation of CHF at the request of Dr. Raymondo Band.  Jonathan [name] is a 36 y.o.with a history of chronic HFrEF, NICM, HTN, asthma, OSA, morbid obesity, and uncontrolled DM.    Diagnosed with systolic HF in [place] [address]. EF 15-20%   Repeat Echo 02/2017. EF had normalized to 55% but went back down when he was off HF meds. EF in 2021 back down to 30-35%. He has refused ICD.     Admitted 10/28/20 with A/C HFrEF, A fib RVR , and marked volume overload. Hospital course complicated by low output so milrinone started. Diuresed with lasix drip + metolazone+ diamox.   Failed milrinone wean and was discharged on milrinone. He was not a candidate for advanced therapies due to noncompliance and uncontrolled diabetes. HGB A1C >14, Palliative consulted for goals of care. He elected DNR/DNI. Referred to [facility] for Hospice services.    Seen in ED 11/25/20 with home milrinone pump issues.    Echo in 1/23 with EF < 20%, mild LV dilation, severe RV dysfunction with moderate RV enlargement.   Patient had been stable symptomatically on home milrinone but over the last 2-3 days, reports increased fatigue and dyspnea as well as rapid weight gain of about 15 lbs. Decreased urination.  +Orthopnea.  Reports increased stress in his life, but no other changes.  He says that he is taking all his meds. Initial creatinine up to 2.14, repeat 1.7.  Lactate 2.1.  Co-ox 52%. SBP 110s-120s on home milrinone 0.375.  Still followed by [facility] hospice.   Review of Systems: All systems reviewed and negative except as per HPI.   Home Medications Prior to Admission medications   Medication Sig Start Date End Date Taking? Authorizing Provider  Accu-Chek Softclix Lancets lancets Check blood sugar 3x a day 06/23/20  Yes Asencion Noble, MD  albuterol  (VENTOLIN HFA) 108 (90 Base) MCG/ACT inhaler Inhale 2 puffs into the lungs every 4 (four) hours as needed for wheezing or shortness of breath. 04/03/21  Yes Padgett, Rae Halsted, MD  amiodarone (PACERONE) 200 MG tablet Take 0.5 tablets (100 mg total) by mouth daily. 12/09/20  Yes Milford, Maricela Bo, FNP  apixaban (ELIQUIS) 5 MG TABS tablet Take 1 tablet (5 mg total) by mouth 2 (two) times daily. 11/12/20  Yes Farrel Gordon, DO  azelastine (ASTELIN) 0.1 % nasal spray Place into both nostrils as needed for rhinitis. Use in each nostril as directed   Yes [provider]  blood glucose meter kit and supplies KIT Dispense based on patient and insurance preference. Use up to four times daily as directed. (FOR ICD-9 250.00, 250.01). For QAC - HS accuchecks. May switch to any brand. 10/29/16  Yes Thurnell Lose, MD  [employer] SENNA 8.6 MG tablet Take 1 tablet by mouth daily as needed for constipation. 03/15/21  Yes [provider]  cyclobenzaprine (FLEXERIL) 5 MG tablet Take 5 mg by mouth every 6 (six) hours as needed for muscle spasms. 03/15/21  Yes [provider]  digoxin (LANOXIN) 0.125 MG tablet TAKE 1 TABLET BY MOUTH EVERY DAY Patient taking differently: Take 0.125 mg by mouth daily. 03/12/21  Yes Larey Dresser, MD  gabapentin (NEURONTIN) 300 MG capsule Take 300 mg by mouth as needed (pain).   Yes [provider]  glucose  blood (ACCU-CHEK GUIDE) test strip Check blood sugar 3 times per day 06/23/20  Yes [name], MD  hyoscyamine (LEVSIN SL) 0.125 MG SL tablet Place 0.125 mg under the tongue every 4 (four) hours as needed for cramping (secretions). 12/14/20  Yes [provider]  insulin aspart (NOVOLOG) 100 UNIT/ML injection Inject 20 Units into the skin 3 (three) times daily with meals. 11/12/20  Yes Farrel Gordon, DO  insulin glargine (LANTUS) 100 UNIT/ML injection Inject 0.3 mLs (30 Units total) into the skin 2 (two) times daily. 11/12/20  Yes Farrel Gordon, DO   Insulin Syringe-Needle U-100 30G X 1/2" 1 ML MISC Use to inject insulin 4 times a day. 11/12/20  Yes Sid Falcon, MD  isosorbide-hydrALAZINE (BIDIL) 20-37.5 MG tablet Take 1 tablet by mouth 3 (three) times daily. 03/18/21  Yes Bensimhon, Shaune Pascal, MD  KLOR-CON M20 20 MEQ tablet TAKE 4 TABLETS (80 MEQ TOTAL) BY MOUTH 2 (TWO) TIMES DAILY. Patient taking differently: Take 80 mEq by mouth 2 (two) times daily. 02/25/21  Yes Larey Dresser, MD  loratadine (CLARITIN) 10 MG tablet Take 1 tablet (10 mg total) by mouth 2 (two) times daily as needed for allergies (Can use an extra dose during flare ups.). 01/14/21  Yes Padgett, Rae Halsted, MD  LORazepam (ATIVAN) 0.5 MG tablet Take 0.5 mg by mouth every 4 (four) hours as needed for anxiety or sleep. 03/13/21  Yes [provider]  metolazone (ZAROXOLYN) 2.5 MG tablet Take 1 tablet (2.5 mg total) by mouth once a week. Every Monday Patient taking differently: Take 2.5 mg by mouth every Monday. Every Monday 03/12/21  Yes Bensimhon, Shaune Pascal, MD  milrinone [facility]) 20 MG/100 ML SOLN infusion Inject 0.0622 mg/min into the vein continuous. 11/12/20  Yes Farrel Gordon, DO  Morphine Sulfate (MORPHINE CONCENTRATE) 10 mg / 0.5 ml concentrated solution Take 5-20 mg by mouth every hour as needed for moderate pain, shortness of breath or anxiety. 0.25-1 ml 01/09/21  Yes [provider]  nitroGLYCERIN (NITROSTAT) 0.3 MG SL tablet Place 0.3 mg under the tongue every 5 (five) minutes as needed for chest pain. 02/27/21  Yes [provider]  ondansetron (ZOFRAN) 4 MG tablet Take 4 mg by mouth daily as needed for nausea or vomiting. 12/14/20  Yes [provider]  sacubitril-valsartan (ENTRESTO) 24-26 MG Take 1 tablet by mouth 2 (two) times daily. 01/07/21  Yes Joette Catching, PA-C  spironolactone (ALDACTONE) 25 MG tablet Take 0.5 tablets (12.5 mg total) by mouth daily. 12/09/20 03/12/22 Yes Milford, Maricela Bo, FNP  torsemide (DEMADEX)  20 MG tablet TAKE 4 TABLETS TWICE DAILY Patient taking differently: Take 80 mg by mouth 2 (two) times daily. 02/20/21 04/05/21 Yes [name], MD  traMADol (ULTRAM) 50 MG tablet TAKE 1 TABLET BY MOUTH EVERY 6 HOURS AS NEEDED. Patient taking differently: Take 50 mg by mouth every 6 (six) hours as needed for moderate pain. 03/12/21  Yes [name], MD  TRULICITY 1.5 SW/1.0XN SOPN INJECT 1.5 MG INTO THE SKIN ONCE A WEEK. Patient taking differently: Inject 1.5 mg into the skin every Tuesday. 01/07/21 07/06/21 Yes [name], MD  Continuous Blood Gluc Sensor (FREESTYLE LIBRE 2 SENSOR) MISC Use to check blood sugar at least 6 times a day 12/08/20   Lacinda Axon, MD    Past Medical History: Past Medical History:  Diagnosis Date   Acute pain of left foot 05/03/2017   Asthma    Asthma, chronic, mild persistent, uncomplicated 23/55/7322  Charcot ankle, left 05/31/2017   CHF (congestive heart failure) (HCC)    Chronic systolic (congestive) heart failure (HCC)    Diabetes mellitus type 2 in obese ([place]) 04/21/2014   Diabetic polyneuropathy associated with type 2 diabetes mellitus ([place]) 03/14/2017   Essential hypertension 10/26/2016   Hypertension    Obesity    Obesity, Class III, BMI 40-49.9 (morbid obesity) ([place]) 10/26/2016   OSA (obstructive sleep apnea) 12/02/2016   Type 2 diabetes mellitus with diabetic neuropathy (HCC)     Past Surgical History: Past Surgical History:  Procedure Laterality Date   CARDIAC CATHETERIZATION     FOOT SURGERY     arch surgery    Family History: Family History  Problem Relation Age of Onset   Diabetes Mellitus II Mother    Heart failure Mother    Hypertension Mother    Heart disease Mother    Heart failure Father    Kidney disease Father    Heart disease Father    Congestive Heart Failure Neg Hx     Social History: Social History   Socioeconomic History   Marital status: Married    Spouse name: Not on file   Number of  children: Not on file   Years of education: Not on file   Highest education level: Not on file  Occupational History   Not on file  Tobacco Use   Smoking status: Never   Smokeless tobacco: Never  Vaping Use   Vaping Use: Never used  Substance and Sexual Activity   Alcohol use: No   Drug use: No   Sexual activity: Never  Other Topics Concern   Not on file  Social History Narrative   Not on file   Social Determinants of Health   Financial Resource Strain: Low Risk    Difficulty of Paying Living Expenses: Not very hard  Food Insecurity: No Food Insecurity   Worried About Running Out of Food in the Last Year: Never true   Ran Out of Food in the Last Year: Never true  Transportation Needs: No Transportation Needs   Lack of Transportation (Medical): No   Lack of Transportation (Non-Medical): No  Physical Activity: Not on file  Stress: Not on file  Social Connections: Not on file    Allergies:  Allergies  Allergen Reactions   Hydrocodone     Hives     Objective:    Vital Signs:   Temp:  [98.5 F (36.9 C)] 98.5 F (36.9 C) (02/19 0140) Pulse Rate:  [104-111] 108 (02/19 1135) Resp:  [15-36] 24 (02/19 1135) BP: (100-127)/(61-93) 114/80 (02/19 1135) SpO2:  [96 %-100 %] 100 % (02/19 1135) Weight:  [158.8 kg] 158.8 kg (02/19 0141)    Weight change: Filed Weights   04/05/21 0141  Weight: (!) 158.8 kg    Intake/Output:   Intake/Output Summary (Last 24 hours) at 04/05/2021 1228 Last data filed at 04/05/2021 1207 Gross per 24 hour  Intake 60.84 ml  Output 3150 ml  Net -3089.16 ml      Physical Exam    General:  Well appearing. No resp difficulty HEENT: normal Neck: supple. JVP 16+. Carotids 2+ bilat; no bruits. No lymphadenopathy or thyromegaly appreciated. Cor: PMI nondisplaced. Regular rate & rhythm. No rubs, gallops or murmurs. Lungs: clear Abdomen: soft, nontender, nondistended. No hepatosplenomegaly. No bruits or masses. Good bowel sounds. Extremities:  no cyanosis, clubbing, rash. 2+ edema to knees.  Neuro: alert & orientedx3, cranial nerves grossly intact. moves all 4 extremities w/o difficulty.  Affect pleasant   Telemetry   ST 100s (personally reviewed)  EKG    Sinus tachycardiac with RBBB (personally reviewed)  Labs   Basic Metabolic Panel: Recent Labs  Lab 04/05/21 0144 04/05/21 0223 04/05/21 0845  NA 123* 123* 127*  K 6.5* 6.6* 4.4  CL 93* 102 92*  CO2 19*  --  23  GLUCOSE 257* 245* 274*  BUN 35* 34* 33*  CREATININE 2.14* 2.20* 1.74*  CALCIUM 8.7*  --  8.8*  MG  --   --  1.7    Liver Function Tests: Recent Labs  Lab 04/05/21 0144  AST 21  ALT 22  ALKPHOS 89  BILITOT 0.5  PROT 8.2*  ALBUMIN 3.2*   No results for input(s): LIPASE, AMYLASE in the last 168 hours. No results for input(s): AMMONIA in the last 168 hours.  CBC: Recent Labs  Lab 04/05/21 0144 04/05/21 0223  WBC 14.9*  --   NEUTROABS 12.5*  --   HGB 11.2* 11.6*  HCT 35.0* 34.0*  MCV 95.9  --   PLT 337  --     Cardiac Enzymes: No results for input(s): CKTOTAL, CKMB, CKMBINDEX, TROPONINI in the last 168 hours.  BNP: BNP (last 3 results) Recent Labs    10/28/20 1315 03/12/21 1505 04/05/21 0148  BNP 1,547.7* 448.3* 460.5*    ProBNP (last 3 results) No results for input(s): PROBNP in the last 8760 hours.   CBG: Recent Labs  Lab 04/05/21 0511 04/05/21 1132  GLUCAP 182* 213*    Coagulation Studies: Recent Labs    04/05/21 0144  LABPROT 18.7*  INR 1.6*     Imaging   DG Chest Portable 1 View  Result Date: 04/05/2021 CLINICAL DATA:  Shortness of breath. EXAM: PORTABLE CHEST 1 VIEW COMPARISON:  10/28/2020. FINDINGS: The heart is enlarged and the mediastinal contour is stable. Interstitial prominence is noted in the mid to lower lung fields bilaterally, unchanged from the prior exam and may be chronic. No consolidation, effusion, or pneumothorax. No acute osseous abnormality. A right PICC line terminates over the  anticipated region of the SVC. IMPRESSION: 1. Cardiomegaly. 2. Stable interstitial prominence in the mid to lower lung fields bilaterally, possible chronic atelectasis or scarring. Electronically Signed   By: Brett Fairy M.D.   On: 04/05/2021 02:15   [place] EKG SITE RITE  Result Date: 04/05/2021 If Site Rite image not attached, placement could not be confirmed due to current cardiac rhythm.    Medications:     Current Medications:  amiodarone  100 mg Oral Daily   apixaban  5 mg Oral BID   digoxin  125 mcg Oral Daily   insulin aspart  0-15 Units Subcutaneous TID WC   insulin aspart  0-5 Units Subcutaneous QHS   isosorbide-hydrALAZINE  1 tablet Oral TID   sodium chloride flush  3 mL Intravenous Q12H   spironolactone  12.5 mg Oral Daily    Infusions:  furosemide (LASIX) 200 mg in dextrose 5% 100 mL ($Remov'2mg'xDfgXo$ /mL) infusion 12 mg/hr (04/05/21 1139)   milrinone 0.375 mcg/kg/min (04/05/21 1207)     Assessment/Plan   1.  End Stage HFrEF: Nonischemic cardiomyopathy. Echo back in 2021 EF 30-35 % with moderate RV dysfunction in the setting of recurrent RBBB with significant dyssynchrony.  Echo 10/2020 EF < 20% and moderate RV dysfunction. He has been on home milrinone 0.375 with [facility] following since 9/22.  Not thought to be candidate for advanced therapies with obesity and noncompliance. Echo in 1/23 reviewed,  shows EF < 20%, mild LV dilation, severe RV dysfunction with moderate RV enlargement.  He has developed NYHA class IV symptoms with cardiogenic shock, co-ox 52% today and elevated lactate.  He has felt better with Lasix 120 mg IV x 1 in the ER. Weight gain at home and marked volume overload.  - Increase milrinone to 0.5 mcg/kg/min - Follow CVP and co-ox off PICC.  - Had 120 mg IV Lasix, will start gtt at 15 mg/hr.  - Hold Entresto, digoxin with AKI.   - Continues spironolactone 12.5 mg daily for now, repeat creatinine lower at 1.7.  - Intolerant SGLT2i due to candidiasis - Continue  Bidil 1 tablet tid, BP stable. - No beta blocker d/t low output - Not candidate for transplant currently due to size (BMI 44). HgbA1c was improved last outpatient visit and he reports med compliance.  Could consider LVAD, but RV function from 1/23 echo is quite poor and concerned that outcome would not be good.  I am afraid he may not have advanced options.  He has been with [facility].  2. DM2: Hgb A1c was better last outpatient check. - Repeat HgbA1c.  - No SGLT2i d/t candidiasis 3. AKI on CKD stage 3: Creatinine 2.14 initially, now 1.7.   - Follow closely.  4. Paroxysmal Atrial Flutter: In NSR today - Continue amio to 100 mg daily.  - Continue Eliquis 5 mg bid. No bleeding issues  5. OSA: Severe OSA AHI 65 - Does not wear CPAP. 6. Obesity: Body mass index is 44.94 kg/m. 7.  GOC - Followed by [facility] for Hospice Services.    Length of Stay: 0  Loralie Champagne, MD  04/05/2021, 12:28 PM  Advanced Heart Failure Team Pager [phone] (M-F; 7a - 5p)  Please contact [facility] for night-coverage after hours (4p -7a ) and weekends on [email or web address]

## 2021-04-05 NOTE — Progress Notes (Signed)
PIV obtained with 2nd set of blood cultures. One set of blood cultures drawn in ED per primary RN. Bilateral arms assessed with and without [place] by 3 IV RNs. Unable to obtain 2nd IV site. Primary RN notified.

## 2021-04-05 NOTE — Progress Notes (Signed)
HD#0 SUBJECTIVE:  Patient Summary: Jonathan [name] is a 36 yo male with nonischemic cardiomyopathy, end-stage heart failure on a milrinone drip at home, type 2 diabetes, and obesity who presents to [facility] with dyspnea on exertion and difficulty moving over the last few days.   Overnight Events: no acute events overnight    Interm History: patient seen and evaluated at bedside. States he is feeling better this morning. Breathing improving. No dizziness, pain. LEE improving.   OBJECTIVE:  Vital Signs: Vitals:   04/05/21 1145 04/05/21 1300 04/05/21 1430 04/05/21 1452  BP: 121/75 109/73 91/61 (!) 143/92  Pulse: (!) 106 (!) 107 (!) 111   Resp: 19 17 (!) 21 20  Temp:    98 F (36.7 C)  TempSrc:    Oral  SpO2: 97% 95% 100% 97%  Weight:    (!) 164.6 kg  Height:    6\' 2"  (1.88 m)   SpO2: 97 % O2 Flow Rate (L/min): 2 L/min  Filed Weights   04/05/21 0141 04/05/21 1452  Weight: (!) 158.8 kg (!) 164.6 kg     Intake/Output Summary (Last 24 hours) at 04/05/2021 1728 Last data filed at 04/05/2021 1647 Gross per 24 hour  Intake 60.84 ml  Output 4400 ml  Net -4339.16 ml   Net IO Since Admission: -4,339.16 mL [04/05/21 1728]  Physical Exam: General: alert and oriented, no acute distress HEENT: PEERL Neck: supple Cardiac: RRR, no MRG Lungs: CTAB, no wheeze, rales, or rhnonchi, on supplemental O2 [place] Abd: soft, NT, not distended, + BS Extremities: 2+ edema to knees Neuro: alert and oriented, moves all extremities Psych: mood and affect appropriate.   Patient Lines/Drains/Airways Status     Active Line/Drains/Airways     Name Placement date Placement time Site Days   PICC Double Lumen 10/28/20 PICC Right Cephalic 39 cm 0 cm 10/28/20  9323  -- 159            Pertinent Labs: CBC Latest Ref Rng & Units 04/05/2021 04/05/2021 03/12/2021  WBC 4.0 - 10.5 K/uL - 14.9(H) 9.9  Hemoglobin 13.0 - 17.0 g/dL 11.6(L) 11.2(L) 11.4(L)  Hematocrit 39.0 - 52.0 % 34.0(L) 35.0(L) 34.8(L)   Platelets 150 - 400 K/uL - 337 276    CMP Latest Ref Rng & Units 04/05/2021 04/05/2021 04/05/2021  Glucose 70 - 99 mg/dL 557(D) 220(U) 542(H)  BUN 6 - 20 mg/dL 06(C) 37(S) 28(B)  Creatinine 0.61 - 1.24 mg/dL 1.51(V) 6.16(W) 7.37(T)  Sodium 135 - 145 mmol/L 127(L) 123(L) 123(L)  Potassium 3.5 - 5.1 mmol/L 4.4 6.6(HH) 6.5(HH)  Chloride 98 - 111 mmol/L 92(L) 102 93(L)  CO2 22 - 32 mmol/L 23 - 19(L)  Calcium 8.9 - 10.3 mg/dL 0.6(Y) - 8.7(L)  Total Protein 6.5 - 8.1 g/dL - - 8.2(H)  Total Bilirubin 0.3 - 1.2 mg/dL - - 0.5  Alkaline Phos 38 - 126 U/L - - 89  AST 15 - 41 U/L - - 21  ALT 0 - 44 U/L - - 22    Recent Labs    04/05/21 0511 04/05/21 1132 04/05/21 1544  GLUCAP 182* 213* 189*     Pertinent Imaging: DG Chest Portable 1 View  Result Date: 04/05/2021 CLINICAL DATA:  Shortness of breath. EXAM: PORTABLE CHEST 1 VIEW COMPARISON:  10/28/2020. FINDINGS: The heart is enlarged and the mediastinal contour is stable. Interstitial prominence is noted in the mid to lower lung fields bilaterally, unchanged from the prior exam and may be chronic. No consolidation, effusion, or  pneumothorax. No acute osseous abnormality. A right PICC line terminates over the anticipated region of the SVC. IMPRESSION: 1. Cardiomegaly. 2. Stable interstitial prominence in the mid to lower lung fields bilaterally, possible chronic atelectasis or scarring. Electronically Signed   By: Brett Fairy M.D.   On: 04/05/2021 02:15   [place] EKG SITE RITE  Result Date: 04/05/2021 If Site Rite image not attached, placement could not be confirmed due to current cardiac rhythm.   ASSESSMENT/PLAN:  Assessment: Active Problems:   Diabetes mellitus type 2 in obese [facility])   Essential hypertension   Acute exacerbation of CHF (congestive heart failure) (HCC)   Hyponatremia with excess extracellular fluid volume  # acute on chronic end stage heart failure with reduced EF Symptoms concerning for cardiogenic shock on admission.  Additionally noted to be approximately 15 lbs over dry weight. He received 120mg  lasix in the ED with some symptomatic improvement. Evidence of volume overload and has been started on lasix drip at 15mg .hr.  He was alert and conversant during evaluation today.  - Heart failure is following. He is currently receiving milrinone at 0.27mccg/kg/min. PICC line had to be pulled due to positive blood cultures related possibly to PICC line. Okay to administer meds including milrinone via PIV. On Bidil TID.  - Patient does have evidence of an AKI, his Entreso and digoxin wil be held until this shows signs of resolving.   - Unable to start patient on SGLT2 due to candidasis. BB contraindicated due to low output.  - Unfortunately, patient is not a candidate for transplant and may not be appropriate candidate for other options such as LVAD. He has been following with [facility].  - will continue I+Os, daily weights   Leukocytosis Blood cultures positive for E faecalis and staph epidermidis. Cultures were drawn from PICC line. Line may be colonized as opposed to true bacteremia as patient is not febrile nor reporting symptoms. He has had a leukocytosis. PICC line will be pulled with peripheral IV placed. He is to have repeat blood cultures drawn. He has been started on ampicillin for E faecalis and ceftriaxone for empiric endocarditis therapy and staph epi coverage. ID following.    # atrial flutter - Patient is currently in normal sinus. He is on 100mg  amio daily as well as Eliquis.   # AKI - concern for low output cardiogenic shock vs cardiorenal - renal function appears to be improving with current treatments. Will need to continue to monitor closely with daily BMPs.   Hyperkalemia - resolved. Continue to monitor.   Type 2 diabetes On sliding scale with meals and bedtime. Continue CBG checks.   Signature: Delene Ruffini, MD  Internal Medicine Resident, PGY-1 [facility]  Pager: [phone] 5:28 PM, 04/05/2021   Please contact the on call pager after 5 pm and on weekends at [phone].

## 2021-04-05 NOTE — H&P (Signed)
Date: 04/05/2021               Patient Name:  Jonathan [name] MRN: [id number]  DOB: 1985/03/05 Age / Sex: 36 y.o., male   PCP: [name], MD         Medical Service: Internal Medicine Teaching Service         Attending Physician: Dr. Lucious Groves, DO    First Contact: Dr. Delene Ruffini Pager: [phone]  Second Contact: Dr. Rick Duff Pager: [phone]       After Hours (After 5p/  First Contact Pager: [phone]  weekends / holidays): Second Contact Pager: [phone]   Chief Complaint: shortness of breath  History of Present Illness: Jonathan [name] is a 36 yo male with nonischemic cardiomyopathy, end-stage heart failure on a milrinone drip at home, type 2 diabetes, and obesity who presents to [facility] with dyspnea on exertion and difficulty moving over the last few days.   Patient is somnolent on exam and most of the history was obtained by significant other at bedside. She notes that over the last 2-3 days, the patient has been more fatigued than normal, has had low urine output and left sided pain. The patient is noted to have gained ~15 lb over the 3 days despite adherence with his medications/diuretics, no recent illnesses, and no changes in diet or fluid intake. His significant other notes that Jonathan [name] only urinated 200-323m every 6-8 hours, which is less than his baseline. The only change to the patient over the last few days was an increase in stress, as a few of his family members are sick, including both of his parents.   Today, the patient endorses "a little" chest pain that occurs mostly when Jonathan [name] coughs. Has had 1.5 week of a dry cough, progressively worsening over the last few days. Jonathan [name] denies any fevers or chills. The patient states that his breathing is "okay", but this could be since Jonathan [name] is sleepy right now. Jonathan [name] does report that it was difficult to catch his breath at home both while exerting himself and while at rest. The patient wears 2-3L of supplemental O2 at home prn,  but does not routinely use this. Jonathan [name] denies any orthopnea or extra use of pillows at night, but does usually sleep on his side. Does not sleep on his back. The patient also states that Jonathan [name] has been unable to stand up over the last day or two, as it feels like his joints are "locked up" every time Jonathan [name] stands up. This has limited his ability to complete his ADL/IADLs. Patient also endorses some nausea and intermittent abdominal pain, but no vomiting, diarrhea, or constipation.    Meds:  Current Meds  Medication Sig   Accu-Chek Softclix Lancets lancets Check blood sugar 3x a day   albuterol (VENTOLIN HFA) 108 (90 Base) MCG/ACT inhaler Inhale 2 puffs into the lungs every 4 (four) hours as needed for wheezing or shortness of breath.   amiodarone (PACERONE) 200 MG tablet Take 0.5 tablets (100 mg total) by mouth daily.   apixaban (ELIQUIS) 5 MG TABS tablet Take 1 tablet (5 mg total) by mouth 2 (two) times daily.   azelastine (ASTELIN) 0.1 % nasal spray Place into both nostrils as needed for rhinitis. Use in each nostril as directed   blood glucose meter kit and supplies KIT Dispense based on patient and insurance preference. Use up to four times daily as directed. (FOR ICD-9 250.00, 250.01). For QAC - HS accuchecks. May  switch to any brand.   [employer] SENNA 8.6 MG tablet Take 1 tablet by mouth daily as needed for constipation.   cyclobenzaprine (FLEXERIL) 5 MG tablet Take 5 mg by mouth every 6 (six) hours as needed for muscle spasms.   digoxin (LANOXIN) 0.125 MG tablet TAKE 1 TABLET BY MOUTH EVERY DAY (Patient taking differently: Take 0.125 mg by mouth daily.)   gabapentin (NEURONTIN) 300 MG capsule Take 300 mg by mouth as needed (pain).   glucose blood (ACCU-CHEK GUIDE) test strip Check blood sugar 3 times per day   hyoscyamine (LEVSIN SL) 0.125 MG SL tablet Place 0.125 mg under the tongue every 4 (four) hours as needed for cramping (secretions).   insulin aspart (NOVOLOG) 100 UNIT/ML injection Inject 20 Units  into the skin 3 (three) times daily with meals.   insulin glargine (LANTUS) 100 UNIT/ML injection Inject 0.3 mLs (30 Units total) into the skin 2 (two) times daily.   Insulin Syringe-Needle U-100 30G X 1/2" 1 ML MISC Use to inject insulin 4 times a day.   isosorbide-hydrALAZINE (BIDIL) 20-37.5 MG tablet Take 1 tablet by mouth 3 (three) times daily.   KLOR-CON M20 20 MEQ tablet TAKE 4 TABLETS (80 MEQ TOTAL) BY MOUTH 2 (TWO) TIMES DAILY. (Patient taking differently: Take 80 mEq by mouth 2 (two) times daily.)   loratadine (CLARITIN) 10 MG tablet Take 1 tablet (10 mg total) by mouth 2 (two) times daily as needed for allergies (Can use an extra dose during flare ups.).   LORazepam (ATIVAN) 0.5 MG tablet Take 0.5 mg by mouth every 4 (four) hours as needed for anxiety or sleep.   metolazone (ZAROXOLYN) 2.5 MG tablet Take 1 tablet (2.5 mg total) by mouth once a week. Every Monday (Patient taking differently: Take 2.5 mg by mouth every Monday. Every Monday)   milrinone (PRIMACOR) 20 MG/100 ML SOLN infusion Inject 0.0622 mg/min into the vein continuous.   Morphine Sulfate (MORPHINE CONCENTRATE) 10 mg / 0.5 ml concentrated solution Take 5-20 mg by mouth every hour as needed for moderate pain, shortness of breath or anxiety. 0.25-1 ml   nitroGLYCERIN (NITROSTAT) 0.3 MG SL tablet Place 0.3 mg under the tongue every 5 (five) minutes as needed for chest pain.   ondansetron (ZOFRAN) 4 MG tablet Take 4 mg by mouth daily as needed for nausea or vomiting.   sacubitril-valsartan (ENTRESTO) 24-26 MG Take 1 tablet by mouth 2 (two) times daily.   spironolactone (ALDACTONE) 25 MG tablet Take 0.5 tablets (12.5 mg total) by mouth daily.   torsemide (DEMADEX) 20 MG tablet TAKE 4 TABLETS TWICE DAILY (Patient taking differently: Take 80 mg by mouth 2 (two) times daily.)   traMADol (ULTRAM) 50 MG tablet TAKE 1 TABLET BY MOUTH EVERY 6 HOURS AS NEEDED. (Patient taking differently: Take 50 mg by mouth every 6 (six) hours as needed  for moderate pain.)   TRULICITY 1.5 OI/3.7CW SOPN INJECT 1.5 MG INTO THE SKIN ONCE A WEEK. (Patient taking differently: Inject 1.5 mg into the skin every Tuesday.)   Allergies: Allergies as of 04/05/2021 - Review Complete 04/05/2021  Allergen Reaction Noted   Hydrocodone  01/03/2012   Past Medical History:  Diagnosis Date   Acute pain of left foot 05/03/2017   Asthma    Asthma, chronic, mild persistent, uncomplicated 88/89/1694   Charcot ankle, left 05/31/2017   CHF (congestive heart failure) (HCC)    Chronic systolic (congestive) heart failure ([place])    Diabetes mellitus type 2 in obese ([place]) 04/21/2014  Diabetic polyneuropathy associated with type 2 diabetes mellitus ([place]) 03/14/2017   Essential hypertension 10/26/2016   Hypertension    Obesity    Obesity, Class III, BMI 40-49.9 (morbid obesity) ([place]) 10/26/2016   OSA (obstructive sleep apnea) 12/02/2016   Type 2 diabetes mellitus with diabetic neuropathy ([place])     Family History: Diabetes, HTN, and cardiovascular disease in mother; renal and cardiovascular disease in father  Social History:  Lives in [place] with wife. Previously, mother was living with them to help with care.  Independent of ADL/IADLs at baseline PCP: Dr. Gilford Rile Denies any tobacco use, alcohol use or illicit drug use. Has been enrolled in Hospice since discharge in September- hospice RN comes to his house 2 times/week.  Review of Systems: A complete ROS was negative except as per HPI.   Physical Exam: Blood pressure 100/61, pulse (!) 104, temperature 98.5 F (36.9 C), temperature source Oral, resp. rate 18, height 6' 2"  (1.88 m), weight (!) 158.8 kg, SpO2 98 %.  Physical Exam Constitutional:      General: Jonathan [name] is not in acute distress.    Appearance: Jonathan [name] is obese.     Comments: Chronically ill appearing  HENT:     Head: Normocephalic and atraumatic.  Eyes:     Extraocular Movements: Extraocular movements intact.     Pupils: Pupils are equal,  round, and reactive to light.  Cardiovascular:     Rate and Rhythm: Regular rhythm. Tachycardia present.     Heart sounds: Normal heart sounds. No murmur heard. Pulmonary:     Effort: Pulmonary effort is normal. No respiratory distress.     Breath sounds: No wheezing or rales.     Comments: Diminished breath sounds at lung bases Difficult to appreciate breath sounds 2/2 body habitus Abdominal:     General: Bowel sounds are normal.     Palpations: Abdomen is soft.     Tenderness: There is no abdominal tenderness.  Musculoskeletal:     Right lower leg: Edema present.     Left lower leg: Edema present.     Comments: 2+ bilateral lower extremity pitting edema   Skin:    Comments: Bilateral lower extremities are cool to touch  Neurological:     General: No focal deficit present.     Mental Status: Jonathan [name] is alert and oriented to person, place, and time.  Psychiatric:        Mood and Affect: Mood normal.        Behavior: Behavior normal.     EKG: personally reviewed my interpretation is sinus tachycardia with RBBB, widening compared to baseline  CXR: personally reviewed my interpretation is cardiomegaly (similar compared to prior CXR)   Assessment & Plan by Problem: Active Problems:   Acute exacerbation of CHF (congestive heart failure) (HCC)  #Acute on chronic end-stage heart failure Last echo in January with EF <20%, LV global hypokinesis, mildly elevated PASP, and severely dilated left atria. Patient presented with SOB, DOE, and ~15 lb weight gain over the last few days despite medication adherence, no dietary changes, and no recent illness. Patient is followed by the advanced heart failure team and sees Dr. Haroldine Laws. At home, the patient is on amiodarone 100 mg daily, spiro 12.5 mg daily, entresto 50 bid, torsemide 80 mg bid, metolazone 2.5 mg once a week, digoxin 0.125 mg daily, and bidil 20-37.5 mg tid. Jonathan [name] received IV lasix 120 mg in the ED with urine output not recorded. Upon my  evaluation, SBP was consistently in the  90s and his extremities were very cool to the touch. Lactic acid elevated to 3.2, BNP 460, and overall, I am concerned for the possibility of cardiogenic shock. Initial troponin also elevated at 148 and delta trop 169.  - Cardiology/heart failure consultation in the morning - Repeat diuresis in the morning - Resume home digoxin - Resume home milrinone - Strict I&Os and daily weights - Co-ox pending - Blood cultures pending  #Atrial flutter On admission, the patient was in sinus tachycardia, with HR in the 100s. EKG showed sinus tachy with RBBB and new widening of QRS interval. At home, the patient takes amiodarone 100 mg daily and eliquis 5 mg bid. - Resumed home amiodarone and eliquis  #Non-anion gap metabolic acidosis #Lactic acidosis  Lactic acid elevated to 3.2 in the ED and bicarb low at 19. Patient's bilateral lower extremities are cool to the touch, suggestive that Jonathan [name] is hypoperfusing. Repeat lactic acid improved to 2.1.   #AKI Patient's baseline renal function is unclear, however, Cr upon last check 3 weeks ago was 1.1, and today, it is elevated to 2.14 with a GFR of 40. Patient is volume overloaded on exam and received IV lasix 120 mg. - Avoid nephrotoxins - Repeat BMP  #Hyperkalemia Potassium elevated to 6.5 upon presentation to the ED with new widening of QRS interval on EKG. Treated with calcium gluconate and insulin/D50 initially. - Repeat BMP  #Hypervolemic hyponatremia  Sodium level 123 in the ED and the patient is overtly volume overloaded on exam. Suspect that hyponatremia will improve with diuresis. - Daily BMP - Diuresis as above  #Normocytic anemia Hb 11.2, MCV 95.9. Anemia is new as of a few months ago, previously had normal Hb at baseline. No active signs of bleeding on exam. - Iron and TIBC, ferritin, and B12 pending - Daily CBC  #Leukocytosis WBC elevated to 14.9, neutrophil predominant. CXR with no signs of  pneumonia and urinalysis with no nitrites or leukocytes. COVID and influenza testing negative.  - Blood cultures pending  #Type 2 diabetes Last A1c in January with HbA1c of 8.3 (previously was >14). Patient is on Trulicity 1.5 mg/week, lantus 30 u bid and novolog 20u tid with meals.  - SSI    Best practices: Code: DNI Diet: Heart healthy Therapy recs: pending  Dispo: Admit patient to Inpatient with expected length of stay greater than 2 midnights.  SignedDorethea Clan, DO 04/05/2021, 5:48 AM  Pager: [phone] After 5pm on weekdays and 1pm on weekends: On Call pager: [phone]

## 2021-04-05 NOTE — Progress Notes (Signed)
Spoke to [place], IM resident regarding patient's difficult IV situation. IV team only able to find 1 site suitable for peripheral U/S guided IV. Resident stated to start that IV, get both sets of blood cultures from that site (not ideal but only option given patient refuses lab sticks), start milrinone through that access along with compatible antibiotics, and they will switch IV lasix to IV pushes.

## 2021-04-05 NOTE — Progress Notes (Signed)
This nurse arrived to unit. Assessed bilateral arms with [place]. Very poor vasculature. Nurse to speak with MD regarding POC. Only 1 vein noted using [place] for PIV placement.  Instructed nurse to re-consult when MD gives clear direction of plan. VU. Fran Lowes, RN VAST

## 2021-04-05 NOTE — Progress Notes (Signed)
I was called to help with a central line IV access for the patient patient is very reluctant not willing to sign the consent for the procedure risk and benefit and chance of decompensation were explained to him he still was not willing to sign the consent for the procedure he has had a long day he is saying and wants to wait in the morning for right now we will decrease the dose of Lasix to 80 every 4 make it more frequent since the 120 had to be infused over an hour and the milrinone cannot be stopped for an hour or so for right now continue with milrinone positive for 1 minute to give the Lasix every 4 hours and discussed with the primary team and cardiology team in the morning.

## 2021-04-05 NOTE — Progress Notes (Addendum)
Received page from RN about medication compatibility with one IV access site. Patient's PICC line was removed earlier today due to concern as source of bacteremia and/or as a nidus of future infection. Unfortunately there are compatibility issues with patient's multiple medications and an inability to administer them simultaneously (ceftriaxone, milrinone, ampicillin, and lasix) into one IV access site. There have previously been multiple attempts by the IV team to obtain a second access site which were all unsuccessful. Spoke with Dr. Earlene Plater from ID who is ok with temp IJ in the setting of bacteremia. Will reach out to PCCM who has agreed to place a temp IJ.   Addendum: After discussing further with PCCM, pt does not want IJ placed at this time. Discussed with RN who indicated to me that PCCM will change IV Lasix 120 mg to 80 mg q4h and hold off on IJ per patient's wishes. If pt were to acutely worsen, would transfer to 2H and place IJ.

## 2021-04-05 NOTE — ED Notes (Signed)
Co ox pulled by Phelb had air bubble which resulted in inconclusive results. Pt requesting not to be further stuck for lab work. MD Aslam made aware.

## 2021-04-05 NOTE — Progress Notes (Signed)
ID wants PICC line removed and peripheral IV inserted d/t infection. Pt is currently refusing 2nd blood culture draw even after lengthy discussion with him the reason for this. Pt states understanding but still refusing. Paged Attending provider to inform and for next steps. States he will come see patient.

## 2021-04-05 NOTE — ED Triage Notes (Signed)
Pt BIB [facility] from home for generalized weakness since this morning. Pt states that he is unable to move and is [facility] on exertion. H/x heart failure on milrinone gtt. 15 lb weight gain in 4 days. Hospice following pt in his home.

## 2021-04-05 NOTE — ED Provider Notes (Signed)
[facility] EMERGENCY DEPARTMENT Provider Note   CSN: [id number] Arrival date & time: 04/05/21  0130     History  Chief Complaint  Patient presents with   Weakness    Jonathan [name] is a 36 y.o. male.  HPI     This is a 36 year old male with significant past medical history including nonischemic cardiomyopathy, end-stage heart failure on a milrinone drip, diabetes, obesity who presents with generalized weakness.  Patient reports over the last several days he has had difficulty moving and shortness of breath on exertion.  Reports a 15 pound weight gain.  He does report missing doses of his home Lasix because "I have misplaced them."  Reports that he is on oxygen at home as needed but cannot tell me how much or how often.  Reports that his wife primarily cares for him.  He is not having any active chest pain but reports "chest pain some of the time."  Does report cough but no fever.  He is currently on hospice.  Chart reviewed.  He was admitted in September.  He has been unable to be weaned from his milrinone drip.  EF less than 20%.  Home Medications Prior to Admission medications   Medication Sig Start Date End Date Taking? Authorizing Provider  Accu-Chek Softclix Lancets lancets Check blood sugar 3x a day 06/23/20  Yes Asencion Noble, MD  albuterol (VENTOLIN HFA) 108 (90 Base) MCG/ACT inhaler Inhale 2 puffs into the lungs every 4 (four) hours as needed for wheezing or shortness of breath. 04/03/21  Yes Padgett, Rae Halsted, MD  amiodarone (PACERONE) 200 MG tablet Take 0.5 tablets (100 mg total) by mouth daily. 12/09/20  Yes Milford, Maricela Bo, FNP  apixaban (ELIQUIS) 5 MG TABS tablet Take 1 tablet (5 mg total) by mouth 2 (two) times daily. 11/12/20  Yes Farrel Gordon, DO  azelastine (ASTELIN) 0.1 % nasal spray Place into both nostrils as needed for rhinitis. Use in each nostril as directed   Yes [provider]  blood glucose meter kit and supplies KIT  Dispense based on patient and insurance preference. Use up to four times daily as directed. (FOR ICD-9 250.00, 250.01). For QAC - HS accuchecks. May switch to any brand. 10/29/16  Yes Thurnell Lose, MD  [employer] SENNA 8.6 MG tablet Take 1 tablet by mouth daily as needed for constipation. 03/15/21  Yes [provider]  cyclobenzaprine (FLEXERIL) 5 MG tablet Take 5 mg by mouth every 6 (six) hours as needed for muscle spasms. 03/15/21  Yes [provider]  digoxin (LANOXIN) 0.125 MG tablet TAKE 1 TABLET BY MOUTH EVERY DAY Patient taking differently: Take 0.125 mg by mouth daily. 03/12/21  Yes Larey Dresser, MD  gabapentin (NEURONTIN) 300 MG capsule Take 300 mg by mouth as needed (pain).   Yes [provider]  glucose blood (ACCU-CHEK GUIDE) test strip Check blood sugar 3 times per day 06/23/20  Yes [name], MD  hyoscyamine (LEVSIN SL) 0.125 MG SL tablet Place 0.125 mg under the tongue every 4 (four) hours as needed for cramping (secretions). 12/14/20  Yes [provider]  insulin aspart (NOVOLOG) 100 UNIT/ML injection Inject 20 Units into the skin 3 (three) times daily with meals. 11/12/20  Yes Farrel Gordon, DO  insulin glargine (LANTUS) 100 UNIT/ML injection Inject 0.3 mLs (30 Units total) into the skin 2 (two) times daily. 11/12/20  Yes Farrel Gordon, DO  Insulin Syringe-Needle U-100 30G X 1/2" 1 ML MISC Use  to inject insulin 4 times a day. 11/12/20  Yes Sid Falcon, MD  isosorbide-hydrALAZINE (BIDIL) 20-37.5 MG tablet Take 1 tablet by mouth 3 (three) times daily. 03/18/21  Yes Bensimhon, Shaune Pascal, MD  KLOR-CON M20 20 MEQ tablet TAKE 4 TABLETS (80 MEQ TOTAL) BY MOUTH 2 (TWO) TIMES DAILY. Patient taking differently: Take 80 mEq by mouth 2 (two) times daily. 02/25/21  Yes Larey Dresser, MD  loratadine (CLARITIN) 10 MG tablet Take 1 tablet (10 mg total) by mouth 2 (two) times daily as needed for allergies (Can use an extra dose during flare ups.). 01/14/21  Yes  Padgett, Rae Halsted, MD  LORazepam (ATIVAN) 0.5 MG tablet Take 0.5 mg by mouth every 4 (four) hours as needed for anxiety or sleep. 03/13/21  Yes [provider]  metolazone (ZAROXOLYN) 2.5 MG tablet Take 1 tablet (2.5 mg total) by mouth once a week. Every Monday Patient taking differently: Take 2.5 mg by mouth every Monday. Every Monday 03/12/21  Yes Bensimhon, Shaune Pascal, MD  milrinone [facility]) 20 MG/100 ML SOLN infusion Inject 0.0622 mg/min into the vein continuous. 11/12/20  Yes Farrel Gordon, DO  Morphine Sulfate (MORPHINE CONCENTRATE) 10 mg / 0.5 ml concentrated solution Take 5-20 mg by mouth every hour as needed for moderate pain, shortness of breath or anxiety. 0.25-1 ml 01/09/21  Yes [provider]  nitroGLYCERIN (NITROSTAT) 0.3 MG SL tablet Place 0.3 mg under the tongue every 5 (five) minutes as needed for chest pain. 02/27/21  Yes [provider]  ondansetron (ZOFRAN) 4 MG tablet Take 4 mg by mouth daily as needed for nausea or vomiting. 12/14/20  Yes [provider]  sacubitril-valsartan (ENTRESTO) 24-26 MG Take 1 tablet by mouth 2 (two) times daily. 01/07/21  Yes Joette Catching, PA-C  spironolactone (ALDACTONE) 25 MG tablet Take 0.5 tablets (12.5 mg total) by mouth daily. 12/09/20 03/12/22 Yes Milford, Maricela Bo, FNP  torsemide (DEMADEX) 20 MG tablet TAKE 4 TABLETS TWICE DAILY Patient taking differently: Take 80 mg by mouth 2 (two) times daily. 02/20/21 04/05/21 Yes [name], MD  traMADol (ULTRAM) 50 MG tablet TAKE 1 TABLET BY MOUTH EVERY 6 HOURS AS NEEDED. Patient taking differently: Take 50 mg by mouth every 6 (six) hours as needed for moderate pain. 03/12/21  Yes [name], MD  TRULICITY 1.5 WK/4.6KM SOPN INJECT 1.5 MG INTO THE SKIN ONCE A WEEK. Patient taking differently: Inject 1.5 mg into the skin every Tuesday. 01/07/21 07/06/21 Yes [name], MD  Continuous Blood Gluc Sensor (FREESTYLE LIBRE 2 SENSOR) MISC Use to check  blood sugar at least 6 times a day 12/08/20   Lacinda Axon, MD      Allergies    Hydrocodone    Review of Systems   Review of Systems  Constitutional:  Negative for fever.  Respiratory:  Positive for cough and shortness of breath.   Cardiovascular:  Positive for leg swelling. Negative for chest pain.  All other systems reviewed and are negative.  Physical Exam Updated Vital Signs BP 111/85    Pulse (!) 106    Temp 98.5 F (36.9 C) (Oral)    Resp 17    Ht 1.88 m (6' 2" )    Wt (!) 158.8 kg    SpO2 100%    BMI 44.94 kg/m  Physical Exam Vitals and nursing note reviewed.  Constitutional:      Appearance: He is well-developed.     Comments: Morbidly obese, chronically ill-appearing  HENT:  Head: Normocephalic and atraumatic.     Nose: Nose normal.     Mouth/Throat:     Mouth: Mucous membranes are dry.  Eyes:     Pupils: Pupils are equal, round, and reactive to light.  Cardiovascular:     Rate and Rhythm: Regular rhythm. Tachycardia present.     Heart sounds: Normal heart sounds.  Pulmonary:     Effort: Pulmonary effort is normal. No respiratory distress.     Breath sounds: Normal breath sounds.     Comments: Limited by body habitus, crackles in the bases, tachypnea, nasal cannula in place, speaking in short sentences, accessory muscle use Abdominal:     Palpations: Abdomen is soft.     Tenderness: There is no abdominal tenderness. There is no rebound.  Musculoskeletal:     Cervical back: Neck supple.     Comments: 2+ pitting edema bilaterally  Lymphadenopathy:     Cervical: No cervical adenopathy.  Skin:    General: Skin is warm and dry.  Neurological:     Mental Status: He is alert and oriented to person, place, and time.  Psychiatric:        Mood and Affect: Mood normal.    ED Results / Procedures / Treatments   Labs (all labs ordered are listed, but only abnormal results are displayed) Labs Reviewed  COMPREHENSIVE METABOLIC PANEL - Abnormal; Notable for  the following components:      Result Value   Sodium 123 (*)    Potassium 6.5 (*)    Chloride 93 (*)    CO2 19 (*)    Glucose, Bld 257 (*)    BUN 35 (*)    Creatinine, Ser 2.14 (*)    Calcium 8.7 (*)    Total Protein 8.2 (*)    Albumin 3.2 (*)    GFR, Estimated 40 (*)    All other components within normal limits  LACTIC ACID, PLASMA - Abnormal; Notable for the following components:   Lactic Acid, Venous 3.2 (*)    All other components within normal limits  CBC WITH DIFFERENTIAL/PLATELET - Abnormal; Notable for the following components:   WBC 14.9 (*)    RBC 3.65 (*)    Hemoglobin 11.2 (*)    HCT 35.0 (*)    Neutro Abs 12.5 (*)    Monocytes Absolute 1.1 (*)    Abs Immature Granulocytes 0.10 (*)    All other components within normal limits  PROTIME-INR - Abnormal; Notable for the following components:   Prothrombin Time 18.7 (*)    INR 1.6 (*)    All other components within normal limits  URINALYSIS, ROUTINE W REFLEX MICROSCOPIC - Abnormal; Notable for the following components:   Color, Urine STRAW (*)    All other components within normal limits  I-STAT CHEM 8, ED - Abnormal; Notable for the following components:   Sodium 123 (*)    Potassium 6.6 (*)    BUN 34 (*)    Creatinine, Ser 2.20 (*)    Glucose, Bld 245 (*)    Calcium, Ion 1.10 (*)    Hemoglobin 11.6 (*)    HCT 34.0 (*)    All other components within normal limits  TROPONIN I (HIGH SENSITIVITY) - Abnormal; Notable for the following components:   Troponin I (High Sensitivity) 148 (*)    All other components within normal limits  RESP PANEL BY RT-PCR (FLU A&B, COVID) ARPGX2  CULTURE, BLOOD (ROUTINE X 2)  CULTURE, BLOOD (ROUTINE X 2)  LACTIC  ACID, PLASMA  BRAIN NATRIURETIC PEPTIDE  TROPONIN I (HIGH SENSITIVITY)    EKG EKG Interpretation  Date/Time:  Sunday April 05 2021 01:39:29 EST Ventricular Rate:  106 PR Interval:  163 QRS Duration: 143 QT Interval:  343 QTC Calculation: 456 R  Axis:   184 Text Interpretation: Sinus tachycardia Right bundle branch block Wider morphology than baseline Does not meet scarbossa criteria Confirmed by Thayer Jew [phone]) on 04/05/2021 1:55:06 AM  Radiology DG Chest Portable 1 View  Result Date: 04/05/2021 CLINICAL DATA:  Shortness of breath. EXAM: PORTABLE CHEST 1 VIEW COMPARISON:  10/28/2020. FINDINGS: The heart is enlarged and the mediastinal contour is stable. Interstitial prominence is noted in the mid to lower lung fields bilaterally, unchanged from the prior exam and may be chronic. No consolidation, effusion, or pneumothorax. No acute osseous abnormality. A right PICC line terminates over the anticipated region of the SVC. IMPRESSION: 1. Cardiomegaly. 2. Stable interstitial prominence in the mid to lower lung fields bilaterally, possible chronic atelectasis or scarring. Electronically Signed   By: Brett Fairy M.D.   On: 04/05/2021 02:15    Procedures .Critical Care Performed by: Merryl Hacker, MD Authorized by: Merryl Hacker, MD   Critical care provider statement:    Critical care time (minutes):  75   Critical care was necessary to treat or prevent imminent or life-threatening deterioration of the following conditions:  Cardiac failure and metabolic crisis   Critical care was time spent personally by me on the following activities:  Development of treatment plan with patient or surrogate, discussions with consultants, evaluation of patient's response to treatment, examination of patient, ordering and review of laboratory studies, ordering and review of radiographic studies, ordering and performing treatments and interventions, pulse oximetry, re-evaluation of patient's condition and review of old charts    Medications Ordered in ED Medications  furosemide (LASIX) 120 mg in dextrose 5 % 50 mL IVPB (0 mg Intravenous Stopped 04/05/21 0502)  sodium zirconium cyclosilicate (LOKELMA) packet 10 g (10 g Oral Given 04/05/21  0350)  calcium gluconate inj 10% (1 g) URGENT USE ONLY! (1 g Intravenous Given 04/05/21 0256)  insulin aspart (novoLOG) injection 5 Units (5 Units Intravenous Given 04/05/21 0257)    And  dextrose 50 % solution 50 mL (50 mLs Intravenous Given 04/05/21 0257)    ED Course/ Medical Decision Making/ A&P Clinical Course as of 04/05/21 0504  Sun Apr 05, 2021  0400 Although on his last admission he was DNR, DNI, both the patient and his wife indicate that he is full code at this time.  They wish for him to be intubated if he were to need it and to receive chest compressions if his heart were to stop. [CH]  7017 Spoke with Dr. Marcelle Smiling, cardiology.  Agrees with escalated dose of Lasix to attempt diuresis.  This would also medicate his potassium issues.  Recommends formal heart failure team consultation during the daytime. [CH]    Clinical Course User Index [CH] Ruthe Roemer, Barbette Hair, MD                           Medical Decision Making Amount and/or Complexity of Data Reviewed Labs: ordered. Radiology: ordered.  Risk OTC drugs. Prescription drug management. Decision regarding hospitalization.   This patient presents to the ED for concern of generalized weakness, shortness of breath, this involves an extensive number of treatment options, and is a complaint that carries with it a high risk  of complications and morbidity.  The differential diagnosis includes decompensated heart failure, ACS, pneumonia, viral illness, metabolic derangement  MDM:    This is a 36 year old unfortunate male with nonischemic cardiomyopathy and heart failure who presents with generalized weakness and shortness of breath.  He clinically appears volume overloaded.  He is not in overt respiratory distress but is requiring nasal cannula and is mildly tachypneic.  EKG shows widened QRS.  No obvious ischemic changes.  This would be concerning for potential metabolic derangement.  I added a Chem-8 to his labs.  Lab work obtained.   Chem-8 indicates potassium of 6.6.  Patient normally has a potassium in the low range.  Creatinine has also doubled.  Highly suspect EKG changes are related to potassium.  This was treated aggressively with calcium gluconate, Lokelma, insulin, glucose, and 120 mg IV Lasix.  On last check, patient is diuresing appropriately.  He has not had any evidence of arrhythmia on the monitor.  COVID and influenza testing negative.  He is also significantly hyponatremic.  Troponin 148.  This may be related to acute kidney failure.  No overt pulmonary edema on chest x-ray.  On recheck, patient is clinically stable.  Confirmed CODE STATUS with the patient and his wife.  He is full code.  Discussed with internal medicine teaching service.  I did run the patient passed the cardiology fellow.  He agrees with aggressive diuresis and heart failure team consultation during the day. (Labs, imaging)  Labs: I Ordered, and personally interpreted labs.  The pertinent results include: Creatinine 2.14, potassium 6.6, sodium 123, troponin 148  Imaging Studies ordered: I ordered imaging studies including chest x-ray I independently visualized and interpreted imaging. I agree with the radiologist interpretation  Additional history obtained from patient's wife.  External records from outside source obtained and reviewed including prior admissions  Critical Interventions: IV Lasix, calcium gluconate, insulin and glucose  Consultations: I requested consultation with the cardiology,  and discussed lab and imaging findings as well as pertinent plan - they recommend: Aggressive diuresis  Cardiac Monitoring: The patient was maintained on a cardiac monitor.  I personally viewed and interpreted the cardiac monitored which showed an underlying rhythm of: Normal sinus rhythm  Reevaluation: After the interventions noted above, I reevaluated the patient and found that they have : Stable   Considered admission for: Decompensated  heart failure, acute kidney injury, hyperkalemia  Social Determinants of Health: Medical noncompliance  Disposition: Admit  Co morbidities that complicate the patient evaluation  Past Medical History:  Diagnosis Date   Acute pain of left foot 05/03/2017   Asthma    Asthma, chronic, mild persistent, uncomplicated 44/96/7591   Charcot ankle, left 05/31/2017   CHF (congestive heart failure) (HCC)    Chronic systolic (congestive) heart failure ([place])    Diabetes mellitus type 2 in obese ([place]) 04/21/2014   Diabetic polyneuropathy associated with type 2 diabetes mellitus ([place]) 03/14/2017   Essential hypertension 10/26/2016   Hypertension    Obesity    Obesity, Class III, BMI 40-49.9 (morbid obesity) ([place]) 10/26/2016   OSA (obstructive sleep apnea) 12/02/2016   Type 2 diabetes mellitus with diabetic neuropathy (HCC)      Medicines Meds ordered this encounter  Medications   furosemide (LASIX) 120 mg in dextrose 5 % 50 mL IVPB   sodium zirconium cyclosilicate (LOKELMA) packet 10 g   calcium gluconate inj 10% (1 g) URGENT USE ONLY!   AND Linked Order Group    insulin aspart (novoLOG) injection 5  Units    dextrose 50 % solution 50 mL    I have reviewed the patients home medicines and have made adjustments as needed  Problem List / ED Course: Problem List Items Addressed This Visit   None Visit Diagnoses     Acute renal failure, unspecified acute renal failure type ([place])    -  Primary   Decompensated heart failure (HCC)       Relevant Medications   nitroGLYCERIN (NITROSTAT) 0.3 MG SL tablet   Hyperkalemia                       Final Clinical Impression(s) / ED Diagnoses Final diagnoses:  Acute renal failure, unspecified acute renal failure type ([place])  Decompensated heart failure (HCC)  Hyperkalemia    Rx / DC Orders ED Discharge Orders     None         Merryl Hacker, MD 04/05/21 [phone]

## 2021-04-06 ENCOUNTER — Encounter: Payer: Medicaid Other | Admitting: Student

## 2021-04-06 DIAGNOSIS — E119 Type 2 diabetes mellitus without complications: Secondary | ICD-10-CM

## 2021-04-06 DIAGNOSIS — I5023 Acute on chronic systolic (congestive) heart failure: Secondary | ICD-10-CM | POA: Diagnosis not present

## 2021-04-06 DIAGNOSIS — G4733 Obstructive sleep apnea (adult) (pediatric): Secondary | ICD-10-CM | POA: Diagnosis not present

## 2021-04-06 DIAGNOSIS — R109 Unspecified abdominal pain: Secondary | ICD-10-CM | POA: Diagnosis not present

## 2021-04-06 DIAGNOSIS — R7881 Bacteremia: Secondary | ICD-10-CM | POA: Diagnosis not present

## 2021-04-06 DIAGNOSIS — I110 Hypertensive heart disease with heart failure: Principal | ICD-10-CM

## 2021-04-06 DIAGNOSIS — I4892 Unspecified atrial flutter: Secondary | ICD-10-CM | POA: Diagnosis not present

## 2021-04-06 DIAGNOSIS — N179 Acute kidney failure, unspecified: Secondary | ICD-10-CM | POA: Diagnosis not present

## 2021-04-06 DIAGNOSIS — F32A Depression, unspecified: Secondary | ICD-10-CM | POA: Diagnosis not present

## 2021-04-06 DIAGNOSIS — E1169 Type 2 diabetes mellitus with other specified complication: Secondary | ICD-10-CM | POA: Diagnosis not present

## 2021-04-06 DIAGNOSIS — I5084 End stage heart failure: Secondary | ICD-10-CM

## 2021-04-06 DIAGNOSIS — E669 Obesity, unspecified: Secondary | ICD-10-CM | POA: Diagnosis not present

## 2021-04-06 DIAGNOSIS — T827XXD Infection and inflammatory reaction due to other cardiac and vascular devices, implants and grafts, subsequent encounter: Secondary | ICD-10-CM | POA: Diagnosis not present

## 2021-04-06 DIAGNOSIS — I509 Heart failure, unspecified: Secondary | ICD-10-CM | POA: Diagnosis not present

## 2021-04-06 DIAGNOSIS — I10 Essential (primary) hypertension: Secondary | ICD-10-CM | POA: Diagnosis not present

## 2021-04-06 DIAGNOSIS — Z515 Encounter for palliative care: Secondary | ICD-10-CM | POA: Diagnosis not present

## 2021-04-06 LAB — CBC
HCT: 32.6 % — ABNORMAL LOW (ref 39.0–52.0)
Hemoglobin: 10.8 g/dL — ABNORMAL LOW (ref 13.0–17.0)
MCH: 30.5 pg (ref 26.0–34.0)
MCHC: 33.1 g/dL (ref 30.0–36.0)
MCV: 92.1 fL (ref 80.0–100.0)
Platelets: 308 10*3/uL (ref 150–400)
RBC: 3.54 MIL/uL — ABNORMAL LOW (ref 4.22–5.81)
RDW: 13.6 % (ref 11.5–15.5)
WBC: 9.3 10*3/uL (ref 4.0–10.5)
nRBC: 0 % (ref 0.0–0.2)

## 2021-04-06 LAB — GLUCOSE, CAPILLARY
Glucose-Capillary: 199 mg/dL — ABNORMAL HIGH (ref 70–99)
Glucose-Capillary: 188 mg/dL — ABNORMAL HIGH (ref 70–99)
Glucose-Capillary: 206 mg/dL — ABNORMAL HIGH (ref 70–99)
Glucose-Capillary: 272 mg/dL — ABNORMAL HIGH (ref 70–99)

## 2021-04-06 LAB — COMPREHENSIVE METABOLIC PANEL
ALT: 19 U/L (ref 0–44)
AST: 25 U/L (ref 15–41)
Albumin: 2.9 g/dL — ABNORMAL LOW (ref 3.5–5.0)
Alkaline Phosphatase: 86 U/L (ref 38–126)
Anion gap: 12 (ref 5–15)
BUN: 35 mg/dL — ABNORMAL HIGH (ref 6–20)
CO2: 25 mmol/L (ref 22–32)
Calcium: 8.8 mg/dL — ABNORMAL LOW (ref 8.9–10.3)
Chloride: 91 mmol/L — ABNORMAL LOW (ref 98–111)
Creatinine, Ser: 1.49 mg/dL — ABNORMAL HIGH (ref 0.61–1.24)
GFR, Estimated: >60 mL/min (ref >60)
Glucose, Bld: 231 mg/dL — ABNORMAL HIGH (ref 70–99)
Potassium: 3.9 mmol/L (ref 3.5–5.1)
Sodium: 128 mmol/L — ABNORMAL LOW (ref 135–145)
Total Bilirubin: 0.9 mg/dL (ref 0.3–1.2)
Total Protein: 7.5 g/dL (ref 6.5–8.1)

## 2021-04-06 LAB — COOXEMETRY PANEL
Carboxyhemoglobin: 1.9 % — ABNORMAL HIGH (ref 0.5–1.5)
Methemoglobin: <0.7 % (ref 0.0–1.5)
O2 Saturation: 79.4 %
Total hemoglobin: 11.1 g/dL — ABNORMAL LOW (ref 12.0–16.0)

## 2021-04-06 LAB — MAGNESIUM: Magnesium: 1.7 mg/dL (ref 1.7–2.4)

## 2021-04-06 LAB — PHOSPHORUS: Phosphorus: 3.2 mg/dL (ref 2.5–4.6)

## 2021-04-06 MED ORDER — SODIUM CHLORIDE 0.9 % IV SOLN: INTRAVENOUS | Status: DC | PRN

## 2021-04-06 MED ORDER — INSULIN GLARGINE-YFGN 100 UNIT/ML ~~LOC~~ SOLN
15.0000 [IU] | Freq: Every day | SUBCUTANEOUS | Status: DC
  Administered 2021-04-06: 15 [IU] via SUBCUTANEOUS
  Filled 2021-04-06 (×2): qty 0.15

## 2021-04-06 MED ORDER — MAGNESIUM SULFATE 2 GM/50ML IV SOLN
2.0000 g | Freq: Once | INTRAVENOUS | Status: AC
  Administered 2021-04-06: 2 g via INTRAVENOUS
  Filled 2021-04-06: qty 50

## 2021-04-06 MED ORDER — POTASSIUM CHLORIDE CRYS ER 20 MEQ PO TBCR
40.0000 meq | EXTENDED_RELEASE_TABLET | Freq: Once | ORAL | Status: AC
  Administered 2021-04-06: 40 meq via ORAL
  Filled 2021-04-06: qty 2

## 2021-04-06 MED ORDER — SODIUM CHLORIDE 0.9 % IV SOLN
2.0000 g | INTRAVENOUS | Status: DC
  Administered 2021-04-06 – 2021-04-09 (×18): 2 g via INTRAVENOUS
  Filled 2021-04-06 (×20): qty 2000

## 2021-04-06 NOTE — Progress Notes (Addendum)
HD#1 SUBJECTIVE:  Patient Summary: Jonathan [name] is a 36 yo male with nonischemic cardiomyopathy, end-stage heart failure on a milrinone drip at home, type 2 diabetes, and obesity who presents to [facility] with dyspnea on exertion and difficulty moving over the last few days.   Overnight Events: no acute events overnight    Interm History: patient seen and evaluated at bedside.doing well this morning. Declined central line last night since he felt pressured. Frustrated by lack of transparency in care. Otherwise, no other complaints and feeling improved. Urinating.    OBJECTIVE:  Vital Signs: Vitals:   04/06/21 0317 04/06/21 0325 04/06/21 0817 04/06/21 1107  BP:  128/60 109/84 (!) 133/106  Pulse:  (!) 108 (!) 104 (!) 106  Resp:  18 20 18   Temp:  98 F (36.7 C) 97.8 F (36.6 C) 97.9 F (36.6 C)  TempSrc:  Oral Oral Oral  SpO2:  92% 94% 96%  Weight: (!) 162.9 kg     Height:       SpO2: 96 % O2 Flow Rate (L/min): 2 L/min  Filed Weights   04/05/21 0141 04/05/21 1452 04/06/21 0317  Weight: (!) 158.8 kg (!) 164.6 kg (!) 162.9 kg     Intake/Output Summary (Last 24 hours) at 04/06/2021 1441 Last data filed at 04/06/2021 1428 Gross per 24 hour  Intake 2337.3 ml  Output 6575 ml  Net -4237.7 ml    Net IO Since Admission: -7,326.86 mL [04/06/21 1441]  Physical Exam: General: alert and oriented, no acute distress HEENT: PEERL Neck: supple Cardiac: RRR, no MRG Lungs: CTAB, no wheeze, rales, or rhnonchi, on supplemental O2 [place] Abd: soft, NT, not distended, + BS Extremities: 2+ edema to upper shin Neuro: alert and oriented, moves all extremities Psych: mood and affect appropriate.   Patient Lines/Drains/Airways Status     Active Line/Drains/Airways     Name Placement date Placement time Site Days   PICC Double Lumen 10/28/20 PICC Right Cephalic 39 cm 0 cm [id number]  1851  -- 159            Pertinent Labs: CBC Latest Ref Rng & Units 04/06/2021 04/05/2021 04/05/2021   WBC 4.0 - 10.5 K/uL 9.3 - 14.9(H)  Hemoglobin 13.0 - 17.0 g/dL 10.8(L) 11.6(L) 11.2(L)  Hematocrit 39.0 - 52.0 % 32.6(L) 34.0(L) 35.0(L)  Platelets 150 - 400 K/uL 308 - 337    CMP Latest Ref Rng & Units 04/06/2021 04/05/2021 04/05/2021  Glucose 70 - 99 mg/dL 231(H) 274(H) 245(H)  BUN 6 - 20 mg/dL 35(H) 33(H) 34(H)  Creatinine 0.61 - 1.24 mg/dL 1.49(H) 1.74(H) 2.20(H)  Sodium 135 - 145 mmol/L 128(L) 127(L) 123(L)  Potassium 3.5 - 5.1 mmol/L 3.9 4.4 6.6(HH)  Chloride 98 - 111 mmol/L 91(L) 92(L) 102  CO2 22 - 32 mmol/L 25 23 -  Calcium 8.9 - 10.3 mg/dL 8.8(L) 8.8(L) -  Total Protein 6.5 - 8.1 g/dL 7.5 - -  Total Bilirubin 0.3 - 1.2 mg/dL 0.9 - -  Alkaline Phos 38 - 126 U/L 86 - -  AST 15 - 41 U/L 25 - -  ALT 0 - 44 U/L 19 - -    Recent Labs    04/05/21 2106 04/06/21 0744 04/06/21 1109  GLUCAP 172* 199* 188*      Pertinent Imaging: No results found.  ASSESSMENT/PLAN:  Assessment: Active Problems:   Diabetes mellitus type 2 in obese [facility])   Essential hypertension   Acute exacerbation of CHF (congestive heart failure) (HCC)   Hyponatremia  with excess extracellular fluid volume  # acute on chronic end stage heart failure with reduced EF Symptoms concerning for cardiogenic shock on admission. Additionally noted to be approximately 15 lbs over dry weight. He received 120mg  lasix in the ED with some symptomatic improvement. PICC had to be pulled, lasix being given in 80mg  pushes Q4hr in between pausing milrinone due to lack of central access.  - Heart failure is following. He is currently receiving milrinone at 0.95mccg/kg/min. PICC line had to be pulled due to positive blood cultures related possibly to PICC line, but this will need to be further investigated with TEE. Okay to administer meds including milrinone via PIV. On Bidil TID.  - Patient does have evidence of an AKI, his Entreso and digoxin wil be held until this shows signs of resolving.   - Unable to start patient on  SGLT2 due to candidasis. BB contraindicated due to low output.  - Unfortunately, patient is not a candidate for transplant and may not be appropriate candidate for other options such as LVAD. He has been following with [facility].  - will continue I+Os, daily weights  Possible Bacteremia  +Blood Cultures Blood cultures positive for E faecalis and staph epidermidis in one set of blood cultures drawn through is PICC line on admission. Line may be colonized as opposed to true bacteremia as patient is not febrile nor reporting symptoms. He has had a leukocytosis. PICC line has been pulled, and he has been receiving medications through PIV. He has been started on ampicillin for E faecalis and ceftriaxone for empiric endocarditis therapy and staph epi coverage. ID recommending TEE for r/o IE. Repeat blood cultures need to be drawn today. If these remain negative for 48 hours minimum from today, can then proceed with PICC. If he becomes unstable can do IJ central access.  - F/u repeat blood cultures - Continue Antibiotics per ID - Plan for likely TEE   # Hx atrial flutter - Patient is currently in normal sinus. He is on 100mg  amio daily as well as Eliquis.   # AKI - concern for low output cardiogenic shock vs cardiorenal - renal function appears to be improving with current treatments. Will need to continue to monitor closely with daily BMPs.   Hyperkalemia - resolved. Continue to monitor.   Type 2 diabetes On sliding scale with meals and bedtime. Continue CBG checks.   Signature: Delene Ruffini, MD  Internal Medicine Resident, PGY-1 [facility]  Pager: [phone] 2:41 PM, 04/06/2021   Please contact the on call pager after 5 pm and on weekends at [phone].

## 2021-04-06 NOTE — Evaluation (Signed)
Occupational Therapy Evaluation Patient Details Name: Jonathan [name] MRN: [id number] DOB: 07/16/1985 Today's Date: 04/06/2021   History of Present Illness 36 y.o. male presents to [facility] hospital on [id number] with DOE and difficulty moving. Pt admitted for management of acute on chronic end-stage heart failure. PMH includes DMII, obesity, HTN, OSA.   Clinical Impression   Prior to this admission patient was living independently with his wife and able to complete all ADLs independently. Patient does not work or drive, and will occasionally take his dog for a walk. Currently patient's main limitations are an elevated HR and blood pressure (HR up to 137 with patient sitting EOB and BP 133/106). Patient at mod I level with ADLs, with increased time and effort needed. Patient would benefit from acute OT in order to address problem list below, but OT is not recommending additional services after this admission at this time.      Recommendations for follow up therapy are one component of a multi-disciplinary discharge planning process, led by the attending physician.  Recommendations may be updated based on patient status, additional functional criteria and insurance authorization.   Follow Up Recommendations  No OT follow up    Assistance Recommended at Discharge Intermittent Supervision/Assistance  Patient can return home with the following Assistance with cooking/housework;Assist for transportation;A little help with bathing/dressing/bathroom    Functional Status Assessment  Patient has had a recent decline in their functional status and demonstrates the ability to make significant improvements in function in a reasonable and predictable amount of time.  Equipment Recommendations  None recommended by OT    Recommendations for Other Services       Precautions / Restrictions Precautions Precautions: Fall Precaution Comments: monitor SpO2 and HR Restrictions Weight Bearing Restrictions: No       Mobility Bed Mobility Overal bed mobility: Needs Assistance             General bed mobility comments: patient sitting EOB upon arrival    Transfers Overall transfer level: Needs assistance Equipment used: None Transfers: Sit to/from Stand Sit to Stand: Supervision           General transfer comment: Able to complete sit<>stand x1 and march in place, limited due to increased HR      Balance Overall balance assessment: Needs assistance Sitting-balance support: No upper extremity supported, Feet supported Sitting balance-Leahy Scale: Good     Standing balance support: No upper extremity supported, During functional activity Standing balance-Leahy Scale: Fair                             ADL either performed or assessed with clinical judgement   ADL Overall ADL's : Modified independent                                       General ADL Comments: Patient is close to his baseline, current limitations are fatigue, elevated HR, and elevated BP     Vision Baseline Vision/History: 1 Wears glasses (Readers occasionally) Ability to See in Adequate Light: 0 Adequate Patient Visual Report: No change from baseline       Perception     Praxis      Pertinent Vitals/Pain Pain Assessment Pain Assessment: No/denies pain     Hand Dominance Right   Extremity/Trunk Assessment Upper Extremity Assessment Upper Extremity Assessment: Overall WFL for tasks assessed   Lower  Extremity Assessment Lower Extremity Assessment: Defer to PT evaluation   Cervical / Trunk Assessment Cervical / Trunk Assessment: Other exceptions Cervical / Trunk Exceptions: excess body habitus   Communication Communication Communication: No difficulties   Cognition Arousal/Alertness: Awake/alert Behavior During Therapy: Flat affect Overall Cognitive Status: Within Functional Limits for tasks assessed                                 General Comments:  Patient has been woken up multiple times this date and had numerous doctors visits, noteably flat and anxious, but kind and participatory     General Comments       Exercises     Shoulder Instructions      Home Living Family/patient expects to be discharged to:: Private residence (Simultaneous filing. User may not have seen previous data.) Living Arrangements: Spouse/significant other (Simultaneous filing. User may not have seen previous data.) Available Help at Discharge: Family;Available PRN/intermittently Type of Home: House Home Access: Level entry     Home Layout: One level     Bathroom Shower/Tub: Occupational psychologist: Standard Bathroom Accessibility: No   Home Equipment: Conservation officer, nature (2 wheels);BSC/3in1          Prior Functioning/Environment Prior Level of Function : Independent/Modified Independent             Mobility Comments: Would occasionally take dog for walks, did not drive, would only ambulate short distances ADLs Comments: Able to complete all ADLs, would occaisionally do some cooking and housework        OT Problem List: Decreased activity tolerance;Impaired balance (sitting and/or standing);Cardiopulmonary status limiting activity;Obesity      OT Treatment/Interventions: Self-care/ADL training;Therapeutic exercise;Energy conservation;DME and/or AE instruction;Therapeutic activities;Patient/family education;Balance training    OT Goals(Current goals can be found in the care plan section) Acute Rehab OT Goals Patient Stated Goal: to get all of this under control OT Goal Formulation: With patient Time For Goal Achievement: 04/20/21 Potential to Achieve Goals: Fair  OT Frequency: Min 2X/week    Co-evaluation              AM-PAC OT "6 Clicks" Daily Activity     Outcome Measure Help from another person eating meals?: None Help from another person taking care of personal grooming?: None Help from another person toileting,  which includes using toliet, bedpan, or urinal?: A Little Help from another person bathing (including washing, rinsing, drying)?: A Little Help from another person to put on and taking off regular upper body clothing?: None Help from another person to put on and taking off regular lower body clothing?: None 6 Click Score: 22   End of Session Nurse Communication: Other (comment) (Elevated HR)  Activity Tolerance: Treatment limited secondary to medical complications (Comment);Patient limited by fatigue Patient left: in bed;with call bell/phone within reach;with family/visitor present;Other (comment) (Sitting EOB`)  OT Visit Diagnosis: Unsteadiness on feet (R26.81);Other abnormalities of gait and mobility (R26.89);Muscle weakness (generalized) (M62.81)                Time: [id number] OT Time Calculation (min): 13 min Charges:  OT General Charges $OT Visit: 1 Visit OT Evaluation $OT Eval Moderate Complexity: 1 Mod  Corinne Ports E. Tenleigh Byer, OTR/L Acute Rehabilitation Services [phone] [place] 04/06/2021, 11:45 AM

## 2021-04-06 NOTE — Progress Notes (Signed)
[facility] for Infectious Disease  Date of Admission:  04/05/2021      Total days of antibiotics 2  Ampicillin + ceftriaxone 2/19 >> current          ASSESSMENT: Jonathan [name] is a 36 y.o. male admitted with shortness of breath from home on chronic milrinone for end stage NICM. Due to presenting leukocytosis/lactic acidosis blood cultures were drawn with enterococcus faecalis and staph epidermidis bacteremia detected. Difficult to know whether this represents true infection or reflection of colonized PICC line as the culture was drawn from PICC line port. The line has since been removed. I think he should undergo TEE to ensure we do not under treat him in light of the enterococcus faecalis, which does carry higher r/f endocarditis. Continue ampicillin + ceftriaxone for now.    PLAN: Would proceed with TEE to rule out endocarditis with more reassurance  Continue amp + BID ceftriaxone for now pending #1 Follow pending micro  Seems his PIV is enough for RN staff for now - would hold another 24 hours prior to placing if hemodynamics/patient medications permit    Active Problems:   Diabetes mellitus type 2 in obese [facility])   Essential hypertension   Acute exacerbation of CHF (congestive heart failure) (HCC)   Hyponatremia with excess extracellular fluid volume    amiodarone  100 mg Oral Daily   apixaban  5 mg Oral BID   digoxin  125 mcg Oral Daily   furosemide  80 mg Intravenous Q4H   insulin aspart  0-15 Units Subcutaneous TID WC   insulin aspart  0-5 Units Subcutaneous QHS   isosorbide-hydrALAZINE  1 tablet Oral TID   potassium chloride  40 mEq Oral Once   sodium chloride flush  3 mL Intravenous Q12H   spironolactone  12.5 mg Oral Daily    SUBJECTIVE: Nothing new to report this morning. Wants to know if we yet know whether he has an infection or not.    Review of Systems: Review of Systems  Constitutional:  Negative for chills and fever.  Gastrointestinal:   Negative for abdominal pain and diarrhea.  Skin:  Negative for itching and rash.    Allergies  Allergen Reactions   Hydrocodone     Hives     OBJECTIVE: Vitals:   04/06/21 0317 04/06/21 0325 04/06/21 0817 04/06/21 1107  BP:  128/60 109/84 (!) 133/106  Pulse:  (!) 108 (!) 104 (!) 106  Resp:  18 20 18   Temp:  98 F (36.7 C) 97.8 F (36.6 C) 97.9 F (36.6 C)  TempSrc:  Oral Oral Oral  SpO2:  92% 94% 96%  Weight: (!) 162.9 kg     Height:       Body mass index is 46.12 kg/m.  Physical Exam Constitutional:      Appearance: Normal appearance.     Comments: Chronically ill appearing sitting on side of bed in no distress.   Cardiovascular:     Rate and Rhythm: Normal rate and regular rhythm.  Musculoskeletal:        General: Swelling present.  Skin:    General: Skin is warm and dry.     Capillary Refill: Capillary refill takes less than 2 seconds.  Neurological:     Mental Status: He is alert and oriented to person, place, and time.     Lab Results Lab Results  Component Value Date   WBC 9.3 04/06/2021   HGB 10.8 (L) 04/06/2021  HCT 32.6 (L) 04/06/2021   MCV 92.1 04/06/2021   PLT 308 04/06/2021    Lab Results  Component Value Date   CREATININE 1.49 (H) 04/06/2021   BUN 35 (H) 04/06/2021   NA 128 (L) 04/06/2021   K 3.9 04/06/2021   CL 91 (L) 04/06/2021   CO2 25 04/06/2021    Lab Results  Component Value Date   ALT 19 04/06/2021   AST 25 04/06/2021   ALKPHOS 86 04/06/2021   BILITOT 0.9 04/06/2021     Microbiology: Recent Results (from the past 240 hour(s))  Culture, blood (Routine x 2)     Status: Abnormal (Preliminary result)   Collection Time: 04/05/21  2:14 AM   Specimen: BLOOD  Result Value Ref Range Status   Specimen Description BLOOD SITE NOT SPECIFIED  Final   Special Requests   Final    BOTTLES DRAWN AEROBIC AND ANAEROBIC Blood Culture adequate volume   Culture  Setup Time   Final    GRAM POSITIVE COCCI IN PAIRS AND CHAINS IN BOTH AEROBIC  AND ANAEROBIC BOTTLES CRITICAL RESULT CALLED TO, READ BACK BY AND VERIFIED WITH: PHARMD E.BRWINGTON AT [id number] ON 04/05/2021 BY T.SAAD.    Culture (A)  Final    ENTEROCOCCUS FAECALIS SUSCEPTIBILITIES TO FOLLOW Performed at [facility], [place] [address], [place], [place] [address]    Report Status PENDING  Incomplete  Blood Culture ID Panel (Reflexed)     Status: Abnormal   Collection Time: 04/05/21  2:14 AM  Result Value Ref Range Status   Enterococcus faecalis DETECTED (A) NOT DETECTED Final    Comment: CRITICAL RESULT CALLED TO, READ BACK BY AND VERIFIED WITH: PHARMD E.BRWINGTON AT [id number] ON 04/05/2021 BY T.SAAD.    Enterococcus Faecium NOT DETECTED NOT DETECTED Final   Listeria monocytogenes NOT DETECTED NOT DETECTED Final   Staphylococcus species DETECTED (A) NOT DETECTED Final    Comment: CRITICAL RESULT CALLED TO, READ BACK BY AND VERIFIED WITH: PHARMD E.BRWINGTON AT [id number] ON 04/05/2021 BY T.SAAD.    Staphylococcus aureus (BCID) NOT DETECTED NOT DETECTED Final   Staphylococcus epidermidis DETECTED (A) NOT DETECTED Final    Comment: CRITICAL RESULT CALLED TO, READ BACK BY AND VERIFIED WITH: PHARMD E.BRWINGTON AT [id number] ON 04/05/2021 BY T.SAAD.    Staphylococcus lugdunensis NOT DETECTED NOT DETECTED Final   Streptococcus species NOT DETECTED NOT DETECTED Final   Streptococcus agalactiae NOT DETECTED NOT DETECTED Final   Streptococcus pneumoniae NOT DETECTED NOT DETECTED Final   Streptococcus pyogenes NOT DETECTED NOT DETECTED Final   A.calcoaceticus-baumannii NOT DETECTED NOT DETECTED Final   Bacteroides fragilis NOT DETECTED NOT DETECTED Final   Enterobacterales NOT DETECTED NOT DETECTED Final   Enterobacter cloacae complex NOT DETECTED NOT DETECTED Final   Escherichia coli NOT DETECTED NOT DETECTED Final   Klebsiella aerogenes NOT DETECTED NOT DETECTED Final   Klebsiella oxytoca NOT DETECTED NOT DETECTED Final   Klebsiella pneumoniae NOT DETECTED NOT DETECTED Final    Proteus species NOT DETECTED NOT DETECTED Final   Salmonella species NOT DETECTED NOT DETECTED Final   Serratia marcescens NOT DETECTED NOT DETECTED Final   Haemophilus influenzae NOT DETECTED NOT DETECTED Final   Neisseria meningitidis NOT DETECTED NOT DETECTED Final   Pseudomonas aeruginosa NOT DETECTED NOT DETECTED Final   Stenotrophomonas maltophilia NOT DETECTED NOT DETECTED Final   Candida albicans NOT DETECTED NOT DETECTED Final   Candida auris NOT DETECTED NOT DETECTED Final   Candida glabrata NOT DETECTED NOT DETECTED Final   Candida krusei  NOT DETECTED NOT DETECTED Final   Candida parapsilosis NOT DETECTED NOT DETECTED Final   Candida tropicalis NOT DETECTED NOT DETECTED Final   Cryptococcus neoformans/gattii NOT DETECTED NOT DETECTED Final   Methicillin resistance mecA/C NOT DETECTED NOT DETECTED Final   Vancomycin resistance NOT DETECTED NOT DETECTED Final    Comment: Performed at [facility], [place] [address], [place], [place] [address]  Resp Panel by RT-PCR (Flu A&B, Covid) Nasopharyngeal Swab     Status: None   Collection Time: 04/05/21  3:07 AM   Specimen: Nasopharyngeal Swab; Nasopharyngeal(NP) swabs in vial transport medium  Result Value Ref Range Status   SARS Coronavirus 2 by RT PCR NEGATIVE NEGATIVE Final    Comment: (NOTE) SARS-CoV-2 target nucleic acids are NOT DETECTED.  The SARS-CoV-2 RNA is generally detectable in upper respiratory specimens during the acute phase of infection. The lowest concentration of SARS-CoV-2 viral copies this assay can detect is 138 copies/mL. A negative result does not preclude SARS-Cov-2 infection and should not be used as the sole basis for treatment or other patient management decisions. A negative result may occur with  improper specimen collection/handling, submission of specimen other than nasopharyngeal swab, presence of viral mutation(s) within the areas targeted by this assay, and inadequate number of  viral copies(<138 copies/mL). A negative result must be combined with clinical observations, patient history, and epidemiological information. The expected result is Negative.  Fact Sheet for Patients:  EntrepreneurPulse.com.au  Fact Sheet for Healthcare Providers:  IncredibleEmployment.be  This test is no t yet approved or cleared by the [place] FDA and  has been authorized for detection and/or diagnosis of SARS-CoV-2 by FDA under an Emergency Use Authorization (EUA). This EUA will remain  in effect (meaning this test can be used) for the duration of the COVID-19 declaration under Section 564(b)(1) of the Act, 21 U.S.C.section 360bbb-3(b)(1), unless the authorization is terminated  or revoked sooner.       Influenza A by PCR NEGATIVE NEGATIVE Final   Influenza B by PCR NEGATIVE NEGATIVE Final    Comment: (NOTE) The Xpert Xpress SARS-CoV-2/FLU/RSV plus assay is intended as an aid in the diagnosis of influenza from Nasopharyngeal swab specimens and should not be used as a sole basis for treatment. Nasal washings and aspirates are unacceptable for Xpert Xpress SARS-CoV-2/FLU/RSV testing.  Fact Sheet for Patients: EntrepreneurPulse.com.au  Fact Sheet for Healthcare Providers: IncredibleEmployment.be  This test is not yet approved or cleared by the [place] FDA and has been authorized for detection and/or diagnosis of SARS-CoV-2 by FDA under an Emergency Use Authorization (EUA). This EUA will remain in effect (meaning this test can be used) for the duration of the COVID-19 declaration under Section 564(b)(1) of the Act, 21 U.S.C. section 360bbb-3(b)(1), unless the authorization is terminated or revoked.  Performed at [facility], [place] [address] [place], [place] [address]   Culture, blood (routine x 2)     Status: None (Preliminary result)   Collection Time: 04/05/21  8:16 PM    Specimen: BLOOD  Result Value Ref Range Status   Specimen Description BLOOD SITE NOT SPECIFIED  Final   Special Requests   Final    BOTTLES DRAWN AEROBIC AND ANAEROBIC Blood Culture adequate volume   Culture   Final    NO GROWTH < 24 HOURS Performed at [facility], [place] [address] [place], [place] [address]    Report Status PENDING  Incomplete     Janene Madeira, MSN, NP-C [email or web address] Pager: [phone] Office: [phone] [facility] Main Line: [place] Communication Welcome

## 2021-04-06 NOTE — Progress Notes (Signed)
Inpatient Diabetes Program Recommendations  AACE/ADA: New Consensus Statement on Inpatient Glycemic Control (2015)  Target Ranges:  Prepandial:   less than 140 mg/dL      Peak postprandial:   less than 180 mg/dL (1-2 hours)      Critically ill patients:  140 - 180 mg/dL   Lab Results  Component Value Date   GLUCAP 188 (H) 04/06/2021   HGBA1C 8.8 (H) 04/05/2021    Review of Glycemic Control  Latest Reference Range & Units 04/05/21 11:32 04/05/21 15:44 04/05/21 21:06 04/06/21 07:44 04/06/21 11:09  Glucose-Capillary 70 - 99 mg/dL 009 (H) 233 (H) 007 (H) 199 (H) 188 (H)   Diabetes history: DM 2 Outpatient Diabetes medications: Novolog 20 units tid with meals, Lantus 30 units bid, Trulicity 1.5 weekly Current orders for Inpatient glycemic control:  Novolog moderate tid with meals and HS  Inpatient Diabetes Program Recommendations:    Please consider adding Semglee 15 units daily and titrate according to fasting CBG's.    Thanks,  Beryl Meager, RN, BC-ADM Inpatient Diabetes Coordinator Pager [phone]  (8a-5p)

## 2021-04-06 NOTE — Progress Notes (Signed)
PHARMACY NOTE:  ANTIMICROBIAL RENAL DOSAGE ADJUSTMENT  Current antimicrobial regimen includes a mismatch between antimicrobial dosage and estimated renal function.  As per policy approved by the Pharmacy & Therapeutics and Medical Executive Committees, the antimicrobial dosage will be adjusted accordingly.  Current antimicrobial dosage:  Ampicillin 1g IV every 6 hours  Indication: E faecalis bacteremia  Renal Function:  Estimated Creatinine Clearance: 96 mL/min (A) (by C-G formula based on SCr of 1.74 mg/dL (H)). []      On intermittent HD, scheduled: []      On CRRT    Antimicrobial dosage has been changed to:  Ampicillin 2g IV every 4 hours  Additional comments:   Thank you for allowing pharmacy to be a part of this patient's care.  Lawson Radar, [facility] 04/06/2021 7:55 AM

## 2021-04-06 NOTE — Progress Notes (Signed)
Mobility Specialist Progress Note:   04/06/21 1200  Mobility  Activity Ambulated independently in hallway  Level of Assistance Independent  Assistive Device None  Distance Ambulated (ft) 400 ft  Activity Response Tolerated well  $Mobility charge 1 Mobility   During Mobility: HR 136bpm Post Mobility: HR 108bpm  Pt eager for mobility session today. No physical assist required, pt back in bed with all needs met.   Nelta Numbers Acute Rehab Phone: [phone] Office Phone: [phone]

## 2021-04-06 NOTE — Progress Notes (Addendum)
Advanced Heart Failure Rounding Note  PCP-Cardiologist: Armanda Magic, MD   Subjective:    Yesterday started on lasix drip at 15 mg per hour but later switched to lasix 80 mg every 4 hours once PICC removed. Milrinone increased to 0.5 mcg.   Brisk diuresis noted. Weight down 3 pounds.   PICC removed- Blood cultures- enterococcus faecalis and Staph epidermidis bacteremia:ID consulted. On ampicillin and ceftriaxone.   Frustrated today about documentation in the computer about refusing cultures. Denies SOB. Denies pain.    Objective:   Weight Range: (!) 162.9 kg Body mass index is 46.12 kg/m.   Vital Signs:   Temp:  [97.6 F (36.4 C)-98 F (36.7 C)] 97.8 F (36.6 C) (02/20 0817) Pulse Rate:  [104-115] 104 (02/20 0817) Resp:  [17-24] 20 (02/20 0817) BP: (91-143)/(60-92) 109/84 (02/20 0817) SpO2:  [92 %-100 %] 94 % (02/20 0817) Weight:  [162.9 kg-164.6 kg] 162.9 kg (02/20 0317) Last BM Date : 04/02/21  Weight change: Filed Weights   04/05/21 0141 04/05/21 1452 04/06/21 0317  Weight: (!) 158.8 kg (!) 164.6 kg (!) 162.9 kg    Intake/Output:   Intake/Output Summary (Last 24 hours) at 04/06/2021 0822 Last data filed at 04/06/2021 0820 Gross per 24 hour  Intake 1049.94 ml  Output 5475 ml  Net -4425.06 ml      Physical Exam    General:  Sitting on the side of the bed. No resp difficulty HEENT: Normal Neck: Supple. JVP to jaw . Carotids 2+ bilat; no bruits. No lymphadenopathy or thyromegaly appreciated. Cor: PMI nondisplaced. Tachy regular rate & rhythm. No rubs, gallops or murmurs. Lungs: Clear Abdomen: Soft, nontender, nondistended. No hepatosplenomegaly. No bruits or masses. Good bowel sounds. Extremities: No cyanosis, clubbing, rash, R and LLE 2_ edema Neuro: Alert & orientedx3, cranial nerves grossly intact. moves all 4 extremities w/o difficulty. Affect pleasant   Telemetry   ST 100-110s   EKG   N/A  Labs    CBC Recent Labs    04/05/21 0144  04/05/21 0223  WBC 14.9*  --   NEUTROABS 12.5*  --   HGB 11.2* 11.6*  HCT 35.0* 34.0*  MCV 95.9  --   PLT 337  --    Basic Metabolic Panel Recent Labs    95/63/87 0144 04/05/21 0223 04/05/21 0845  NA 123* 123* 127*  K 6.5* 6.6* 4.4  CL 93* 102 92*  CO2 19*  --  23  GLUCOSE 257* 245* 274*  BUN 35* 34* 33*  CREATININE 2.14* 2.20* 1.74*  CALCIUM 8.7*  --  8.8*  MG  --   --  1.7   Liver Function Tests Recent Labs    04/05/21 0144  AST 21  ALT 22  ALKPHOS 89  BILITOT 0.5  PROT 8.2*  ALBUMIN 3.2*   No results for input(s): LIPASE, AMYLASE in the last 72 hours. Cardiac Enzymes No results for input(s): CKTOTAL, CKMB, CKMBINDEX, TROPONINI in the last 72 hours.  BNP: BNP (last 3 results) Recent Labs    10/28/20 1315 03/12/21 1505 04/05/21 0148  BNP 1,547.7* 448.3* 460.5*    ProBNP (last 3 results) No results for input(s): PROBNP in the last 8760 hours.   D-Dimer No results for input(s): DDIMER in the last 72 hours. Hemoglobin A1C Recent Labs    04/05/21 2016  HGBA1C 8.8*   Fasting Lipid Panel No results for input(s): CHOL, HDL, LDLCALC, TRIG, CHOLHDL, LDLDIRECT in the last 72 hours. Thyroid Function Tests No results for input(s):  TSH, T4TOTAL, T3FREE, THYROIDAB in the last 72 hours.  Invalid input(s): FREET3  Other results:   Imaging    [place] EKG SITE RITE  Result Date: 04/05/2021 If Site Rite image not attached, placement could not be confirmed due to current cardiac rhythm.    Medications:     Scheduled Medications:  amiodarone  100 mg Oral Daily   apixaban  5 mg Oral BID   digoxin  125 mcg Oral Daily   furosemide  80 mg Intravenous Q4H   insulin aspart  0-15 Units Subcutaneous TID WC   insulin aspart  0-5 Units Subcutaneous QHS   isosorbide-hydrALAZINE  1 tablet Oral TID   sodium chloride flush  3 mL Intravenous Q12H   spironolactone  12.5 mg Oral Daily    Infusions:  ampicillin (OMNIPEN) IV     cefTRIAXone (ROCEPHIN)  IV Stopped  (04/06/21 0739)   milrinone 0.5 mcg/kg/min (04/06/21 0722)    PRN Medications: acetaminophen **OR** acetaminophen, senna-docusate    Patient Profile     Jonathan [name] is a 36 y.o.with a history of chronic HFrEF, NICM, HTN, asthma, OSA, morbid obesity, and uncontrolled DM.   On home milrinone.  Assessment/Plan   1.  End Stage HFrEF: Nonischemic cardiomyopathy. Echo back in 2021 EF 30-35 % with moderate RV dysfunction in the setting of recurrent RBBB with significant dyssynchrony.  Echo 10/2020 EF < 20% and moderate RV dysfunction. He has been on home milrinone 0.375 with [facility] following since 9/22.  Not thought to be candidate for advanced therapies with obesity and noncompliance. Echo in 1/23 reviewed, shows EF < 20%, mild LV dilation, severe RV dysfunction with moderate RV enlargement.  He has developed NYHA class IV symptoms with cardiogenic shock, co-ox 52% today and elevated lactate.  He has felt better with Lasix 120 mg IV x 1 in the ER. Weight gain at home and marked volume overload.  - No central access. Does not want central line.  - Continue milrinone to 0.5 mcg/kg/min -- Volume status remains elevated. Continue lasix 80 mg every 4 hours.   - Hold Entresto, digoxin with AKI.   - Continues spironolactone 12.5 mg daily for now - Intolerant SGLT2i due to candidiasis - Continue Bidil 1 tablet tid, BP stable. - No beta blocker d/t low output - Not candidate for transplant currently due to size (BMI 44). HgbA1c was improved last outpatient visit and he reports med compliance.  Could consider LVAD, but RV function from 1/23 echo is quite poor and concerned that outcome would not be good.  I am afraid he may not have advanced options.  [facility] with [facility].  2. DM2: Hgb A1c was better last outpatient check. - Repeat HgbA1c.  - No SGLT2i d/t candidiasis 3. AKI on CKD stage 3: Creatinine 2.14 initially, pending.  - Follow closely.  4. Paroxysmal Atrial Flutter: In  NSR today - Continue amio to 100 mg daily.  - Continue Eliquis 5 mg bid. No bleeding issues  5. OSA: Severe OSA AHI 65 - Does not wear CPAP. 6. Obesity: Body mass index is 44.94 kg/m. 7.  GOC - Followed by [facility] for [facility].  8. ID - Bld CX. Enterococcus faecalis and Staph epidermidis bacteremia. ID following with antibiotics adjusted.    Will need eventual PICC. He is very frustrated and gets anxious when several healthcare providers enter the room at once. He requests time to make his decisions.    Labs pending.   Length of  Stay: 1  Jonathan Grinder, NP  04/06/2021, 8:22 AM  Advanced Heart Failure Team Pager [phone] (M-F; 7a - 5p)  Please contact [facility] for night-coverage after hours (5p -7a ) and weekends on [email or web address]  Agree with above.    He remains tenuous. Now on milrinone 0.5. Deneis CP or SOB. Feels overwhelmed. Refused central line last night. Starting to diurese again but still significantly volume overloaded. Scr improving.   General:  Obese male  No resp difficulty HEENT: normal Neck: supple. JVP to ear Carotids 2+ bilat; no bruits. No lymphadenopathy or thryomegaly appreciated. Cor: PMI nondisplaced. Tachy regular + s3 Lungs: clear Abdomen: soft, nontender, nondistended. No hepatosplenomegaly. No bruits or masses. Good bowel sounds. Extremities: no cyanosis, clubbing, rash, 2-3+ edema Neuro: alert & orientedx3, cranial nerves grossly intact. moves all 4 extremities w/o difficulty. Affect pleasant  He is end-stage. Agree with milrinone 0.5. Ok to give through PIV for now. Continue IV lasix.   Agree with ID that +bcx may be PICC colonization. But PICC now out. Given enterococcus will discuss possible need for TEE  Ideally would need VAD but doubt RV will tolerate. Once diuresed will need to repeat RHC to further assess. Not transplant candidate with BMI.   Glori Bickers, MD  11:48 AM

## 2021-04-07 DIAGNOSIS — I509 Heart failure, unspecified: Secondary | ICD-10-CM | POA: Diagnosis not present

## 2021-04-07 DIAGNOSIS — I5023 Acute on chronic systolic (congestive) heart failure: Secondary | ICD-10-CM | POA: Diagnosis not present

## 2021-04-07 DIAGNOSIS — E119 Type 2 diabetes mellitus without complications: Secondary | ICD-10-CM | POA: Diagnosis not present

## 2021-04-07 DIAGNOSIS — R7881 Bacteremia: Secondary | ICD-10-CM | POA: Diagnosis not present

## 2021-04-07 DIAGNOSIS — I4892 Unspecified atrial flutter: Secondary | ICD-10-CM | POA: Diagnosis not present

## 2021-04-07 DIAGNOSIS — R109 Unspecified abdominal pain: Secondary | ICD-10-CM | POA: Diagnosis not present

## 2021-04-07 DIAGNOSIS — I5084 End stage heart failure: Secondary | ICD-10-CM | POA: Diagnosis not present

## 2021-04-07 DIAGNOSIS — F32A Depression, unspecified: Secondary | ICD-10-CM | POA: Diagnosis not present

## 2021-04-07 DIAGNOSIS — I10 Essential (primary) hypertension: Secondary | ICD-10-CM | POA: Diagnosis not present

## 2021-04-07 DIAGNOSIS — G4733 Obstructive sleep apnea (adult) (pediatric): Secondary | ICD-10-CM | POA: Diagnosis not present

## 2021-04-07 DIAGNOSIS — Z515 Encounter for palliative care: Secondary | ICD-10-CM | POA: Diagnosis not present

## 2021-04-07 DIAGNOSIS — E669 Obesity, unspecified: Secondary | ICD-10-CM | POA: Diagnosis not present

## 2021-04-07 LAB — CBC
HCT: 31.3 % — ABNORMAL LOW (ref 39.0–52.0)
Hemoglobin: 10.7 g/dL — ABNORMAL LOW (ref 13.0–17.0)
MCH: 30.9 pg (ref 26.0–34.0)
MCHC: 34.2 g/dL (ref 30.0–36.0)
MCV: 90.5 fL (ref 80.0–100.0)
Platelets: 289 10*3/uL (ref 150–400)
RBC: 3.46 MIL/uL — ABNORMAL LOW (ref 4.22–5.81)
RDW: 13.6 % (ref 11.5–15.5)
WBC: 8.1 10*3/uL (ref 4.0–10.5)
nRBC: 0 % (ref 0.0–0.2)

## 2021-04-07 LAB — BASIC METABOLIC PANEL
Anion gap: 13 (ref 5–15)
BUN: 33 mg/dL — ABNORMAL HIGH (ref 6–20)
CO2: 26 mmol/L (ref 22–32)
Calcium: 8.5 mg/dL — ABNORMAL LOW (ref 8.9–10.3)
Chloride: 89 mmol/L — ABNORMAL LOW (ref 98–111)
Creatinine, Ser: 1.27 mg/dL — ABNORMAL HIGH (ref 0.61–1.24)
GFR, Estimated: >60 mL/min (ref >60)
Glucose, Bld: 222 mg/dL — ABNORMAL HIGH (ref 70–99)
Potassium: 3.1 mmol/L — ABNORMAL LOW (ref 3.5–5.1)
Sodium: 128 mmol/L — ABNORMAL LOW (ref 135–145)

## 2021-04-07 LAB — CULTURE, BLOOD (ROUTINE X 2): Special Requests: ADEQUATE

## 2021-04-07 LAB — GLUCOSE, CAPILLARY
Glucose-Capillary: 213 mg/dL — ABNORMAL HIGH (ref 70–99)
Glucose-Capillary: 184 mg/dL — ABNORMAL HIGH (ref 70–99)
Glucose-Capillary: 195 mg/dL — ABNORMAL HIGH (ref 70–99)
Glucose-Capillary: 207 mg/dL — ABNORMAL HIGH (ref 70–99)

## 2021-04-07 LAB — DIGOXIN LEVEL: Digoxin Level: 0.4 ng/mL — ABNORMAL LOW (ref 0.8–2.0)

## 2021-04-07 LAB — MAGNESIUM: Magnesium: 1.8 mg/dL (ref 1.7–2.4)

## 2021-04-07 MED ORDER — POTASSIUM CHLORIDE CRYS ER 20 MEQ PO TBCR: 40.0000 meq | EXTENDED_RELEASE_TABLET | Freq: Two times a day (BID) | ORAL | Status: DC

## 2021-04-07 MED ORDER — SPIRONOLACTONE 25 MG PO TABS
25.0000 mg | ORAL_TABLET | Freq: Every day | ORAL | Status: DC
Start: 2021-04-08 — End: 2021-04-15
  Administered 2021-04-08 – 2021-04-15 (×8): 25 mg via ORAL
  Filled 2021-04-07 (×8): qty 1

## 2021-04-07 MED ORDER — INSULIN ASPART 100 UNIT/ML IJ SOLN
5.0000 [IU] | Freq: Three times a day (TID) | INTRAMUSCULAR | Status: DC
  Administered 2021-04-07 (×2): 5 [IU] via SUBCUTANEOUS

## 2021-04-07 MED ORDER — POTASSIUM CHLORIDE CRYS ER 20 MEQ PO TBCR
40.0000 meq | EXTENDED_RELEASE_TABLET | Freq: Two times a day (BID) | ORAL | Status: AC
  Administered 2021-04-07 (×2): 40 meq via ORAL
  Filled 2021-04-07 (×2): qty 2

## 2021-04-07 MED ORDER — MAGNESIUM SULFATE 2 GM/50ML IV SOLN
2.0000 g | Freq: Once | INTRAVENOUS | Status: AC
  Administered 2021-04-07: 2 g via INTRAVENOUS
  Filled 2021-04-07: qty 50

## 2021-04-07 MED ORDER — POTASSIUM CHLORIDE CRYS ER 20 MEQ PO TBCR
40.0000 meq | EXTENDED_RELEASE_TABLET | Freq: Once | ORAL | Status: AC
  Administered 2021-04-07: 40 meq via ORAL
  Filled 2021-04-07: qty 2

## 2021-04-07 MED ORDER — SPIRONOLACTONE 12.5 MG HALF TABLET
12.5000 mg | ORAL_TABLET | Freq: Once | ORAL | Status: AC
Start: 2021-04-07 — End: 2021-04-07
  Administered 2021-04-07: 12.5 mg via ORAL
  Filled 2021-04-07: qty 1

## 2021-04-07 MED ORDER — MAGNESIUM SULFATE 2 GM/50ML IV SOLN: 2.0000 g | Freq: Once | INTRAVENOUS | Status: DC

## 2021-04-07 MED ORDER — INSULIN GLARGINE-YFGN 100 UNIT/ML ~~LOC~~ SOLN
20.0000 [IU] | Freq: Every day | SUBCUTANEOUS | Status: DC
  Administered 2021-04-07: 20 [IU] via SUBCUTANEOUS
  Filled 2021-04-07: qty 0.2

## 2021-04-07 NOTE — Consult Note (Signed)
° °  [facility] Inpatient Consult   04/07/2021  Tramane Gorum 09-09-1985 [id number]  Managed Medicaid: [place] [employer]  Primary Care Provider:  [name], MD Is an Embedded provider with [facility], which is listed to provide the [facility] follow up   Patient is showing on the extreme high risk for unplanned readmission risk.  Patient had been outreached in the Managed Medicaid team however patient was active with other external care management team, including [facility], and also follwed by Advanced HF clinic noted.  Discussed in unit progression meeting for post hospital transition of care needs.  Plan: To continue to follow, currently needs are to be met according to current transitional pan.  Natividad Brood, RN BSN [facility] Liaison  [phone] business mobile phone Toll free office [phone]  Fax number: [phone] [email or web address]

## 2021-04-07 NOTE — Progress Notes (Signed)
Mobility Specialist Progress Note:   04/07/21 1000  Mobility  Activity Ambulated with assistance in hallway  Level of Assistance Standby assist, set-up cues, supervision of patient - no hands on  Assistive Device None  Distance Ambulated (ft) 500 ft  Activity Response Tolerated well  $Mobility charge 1 Mobility   During mobility: HR 136bpm  Pt eager for OOB mobility this am. No physical assist required, pt back sitting EOB with all needs met.   Nelta Numbers Acute Rehab Phone: [phone] Office Phone: [phone]

## 2021-04-07 NOTE — Progress Notes (Signed)
HD#2 SUBJECTIVE:  Patient Summary: Jonathan [name] is a 36 yo male with nonischemic cardiomyopathy, end-stage heart failure on a milrinone drip at home, type 2 diabetes, and obesity who presents to [facility] with dyspnea on exertion and difficulty moving over the last few days.   Overnight Events: no acute events overnight    Interm History: patient seen and evaluated at bedside. Doing well this morning. Swelling has improved in legs. Breathing comfortably on RA.  OBJECTIVE:  Vital Signs: Vitals:   04/07/21 0200 04/07/21 0400 04/07/21 0825 04/07/21 1055  BP: 114/70 107/63 111/76 134/69  Pulse: (!) 105  (!) 116 (!) 113  Resp: 17 16 20    Temp: 98.8 F (37.1 C) 98.4 F (36.9 C) 98.6 F (37 C) (!) 97.5 F (36.4 C)  TempSrc: Axillary Oral Oral Oral  SpO2: 94% 91% 94% 97%  Weight:  (!) 161.2 kg    Height:       SpO2: 97 % O2 Flow Rate (L/min): 2 L/min  Filed Weights   04/05/21 1452 04/06/21 0317 04/07/21 0400  Weight: (!) 164.6 kg (!) 162.9 kg (!) 161.2 kg     Intake/Output Summary (Last 24 hours) at 04/07/2021 1305 Last data filed at 04/07/2021 1303 Gross per 24 hour  Intake 2969.39 ml  Output 9575 ml  Net -6605.61 ml   Net IO Since Admission: -13,450.17 mL [04/07/21 1305]  Physical Exam: General: alert and oriented, no acute distress HEENT: PEERL Neck: supple Cardiac: tachycardic, regular rhythm, no MRG Lungs: CTAB, no wheeze, rales, or rhnonchi, on supplemental O2 [place] Abd: soft, NT, not distended, + BS Extremities: 1+ edema to upper shin 2+ lower shin, wraps in place. Neuro: alert and oriented, moves all extremities Psych: mood and affect appropriate.   Patient Lines/Drains/Airways Status     Active Line/Drains/Airways     Name Placement date Placement time Site Days   PICC Double Lumen 10/28/20 PICC Right Cephalic 39 cm 0 cm [id number]  1851  -- 159            Pertinent Labs: CBC Latest Ref Rng & Units 04/07/2021 04/06/2021 04/05/2021  WBC 4.0 - 10.5  K/uL 8.1 9.3 -  Hemoglobin 13.0 - 17.0 g/dL 10.7(L) 10.8(L) 11.6(L)  Hematocrit 39.0 - 52.0 % 31.3(L) 32.6(L) 34.0(L)  Platelets 150 - 400 K/uL 289 308 -    CMP Latest Ref Rng & Units 04/07/2021 04/06/2021 04/05/2021  Glucose 70 - 99 mg/dL 222(H) 231(H) 274(H)  BUN 6 - 20 mg/dL 33(H) 35(H) 33(H)  Creatinine 0.61 - 1.24 mg/dL 1.27(H) 1.49(H) 1.74(H)  Sodium 135 - 145 mmol/L 128(L) 128(L) 127(L)  Potassium 3.5 - 5.1 mmol/L 3.1(L) 3.9 4.4  Chloride 98 - 111 mmol/L 89(L) 91(L) 92(L)  CO2 22 - 32 mmol/L 26 25 23   Calcium 8.9 - 10.3 mg/dL 8.5(L) 8.8(L) 8.8(L)  Total Protein 6.5 - 8.1 g/dL - 7.5 -  Total Bilirubin 0.3 - 1.2 mg/dL - 0.9 -  Alkaline Phos 38 - 126 U/L - 86 -  AST 15 - 41 U/L - 25 -  ALT 0 - 44 U/L - 19 -    Recent Labs    04/06/21 2106 04/07/21 0608 04/07/21 1101  GLUCAP 272* 213* 184*     Pertinent Imaging: No results found.  ASSESSMENT/PLAN:  Assessment: Active Problems:   Diabetes mellitus type 2 in obese [facility])   Essential hypertension   Acute exacerbation of CHF (congestive heart failure) (HCC)   Hyponatremia with excess extracellular fluid volume  Positive blood culture  # acute on chronic end stage heart failure with reduced EF Noted to be approximately 15 lbs over dry weight. He appears to be making improvemebnts daily. He has had good urine output, was sitting up at bedside comfortably this morning without any supplemental O2. He has been able to ambulate 554ft with mobility tech. - Heart failure is following. PICC line had to be pulled due to positive blood cultures. He is currently receiving milrinone at 0.31mccg/kg/min.  Okay to administer meds including milrinone via PIV. Lasix being given in 80mg  pushes Q4hr in between pausing milrinone due to lack of central access. No CO-OX available at this time either. If patient does require better access in emergent situation, central access can be obtained via IJ. On Digoxin 0.125mg , Bidil TID. Arlyce Harman has been  increased to 25mg  daily. - Patient does have evidence of an AKI, which is now resolving. Cr 1.27 today with baseline around 1.1. His Malvin Johns is being held held currently, may be able to restart soon.    - Unable to start patient on SGLT2 due to candidasis. BB contraindicated due to low output.  - Unfortunately, patient is not a candidate for transplant and may not be appropriate candidate for other options such as LVAD. He has been following with [facility].  - will continue I+Os, daily weights - once euvolemia is achieved, he will be evaluated with RHC.   Possible Bacteremia  +Blood Cultures Blood cultures positive for E faecalis and staph epidermidis in one set of blood cultures drawn through is PICC line on admission.= PICC line has been pulled. Line may be colonized as opposed to true bacteremia as patient is not febrile nor reporting symptoms. He has had a leukocytosis. Blood was also collected later that same day and has demonstrated no growth 2 days. He has been started on ampicillin for E faecalis and ceftriaxone for empiric endocarditis therapy and staph epi coverage. ID recommending TEE for r/o IE. Repeat blood cultures need to be drawn yesterday showing no growth <24hrs. If these remain negative for 48 hours minimum from today, can then proceed with PICC. If he becomes unstable can do IJ central access.  - F/u repeat blood cultures - Continue Antibiotics per ID - Plan for likely TEE   # Hx atrial flutter - Patient is currently in normal sinus. He is on 100mg  amio daily as well as Eliquis.   # AKI - suspect cardiorenal as his serum creatinine has shown improvements with continued diuresis. Cannot exclude low output HF however as he has been on increased milrinone. Will need to continue to monitor closely with daily BMPs.   Hyperkalemia - 3.1 this AM due to aggressive diuresis. Repleted. Will need to continue to monitor,.   Type 2 diabetes On sliding scale with meals and  bedtime. Glucose has been running high in 200's despite [place]. Will increase long acting. Continue CBG checks.   Signature: Delene Ruffini, MD  Internal Medicine Resident, PGY-1 [facility]  Pager: [phone] 1:05 PM, 04/07/2021   Please contact the on call pager after 5 pm and on weekends at [phone].

## 2021-04-07 NOTE — Progress Notes (Addendum)
Advanced Heart Failure Rounding Note  PCP-Cardiologist: Armanda Magic, MD   Subjective:    Continues on milrinone 0.5. PICC out, no co-ox.  Diuresing well with IV lasix 80 mg q 4hrs. -5.5L yesterday. Weight down 4 lb.  Scr improving, 2.2>1.74>1.49>1.27  K 3.1  Blood cultures- enterococcus faecalis and Staph epidermidis bacteremia: ID consulted. On ampicillin and ceftriaxone. TEE recommended to r/o endocarditis  Dyspnea and leg edema improved. Ambulated 500 feet with mobility tech.    Objective:   Weight Range: (!) 161.2 kg Body mass index is 45.63 kg/m.   Vital Signs:   Temp:  [97.9 F (36.6 C)-98.8 F (37.1 C)] 98.6 F (37 C) (02/21 0825) Pulse Rate:  [100-116] 116 (02/21 0825) Resp:  [16-20] 20 (02/21 0825) BP: (105-133)/(55-106) 111/76 (02/21 0825) SpO2:  [91 %-97 %] 94 % (02/21 0825) Weight:  [161.2 kg] 161.2 kg (02/21 0400) Last BM Date : 04/06/21  Weight change: Filed Weights   04/05/21 1452 04/06/21 0317 04/07/21 0400  Weight: (!) 164.6 kg (!) 162.9 kg (!) 161.2 kg    Intake/Output:   Intake/Output Summary (Last 24 hours) at 04/07/2021 0949 Last data filed at 04/07/2021 0930 Gross per 24 hour  Intake 3206.39 ml  Output 9725 ml  Net -6518.61 ml      Physical Exam    General:  Sitting up in bed. No distress. HEENT: normal Neck: supple. JVP to jaw. Carotids 2+ bilat; no bruits.  Cor: PMI nondisplaced. Regular rate & rhythm, tachy. No rubs, gallops or murmurs. Lungs: diminished Abdomen: soft, nontender, nondistended, + fluid Extremities: no cyanosis, clubbing, rash, 1-2+ edema, ACE wraps on Neuro: alert & orientedx3, cranial nerves grossly intact. moves all 4 extremities w/o difficulty. Affect pleasant   Telemetry   Sinus tach 100s-110s (personally reviewed)  EKG   N/A  Labs    CBC Recent Labs    04/05/21 0144 04/05/21 0223 04/06/21 0831 04/07/21 0151  WBC 14.9*  --  9.3 8.1  NEUTROABS 12.5*  --   --   --   HGB 11.2*   < >  10.8* 10.7*  HCT 35.0*   < > 32.6* 31.3*  MCV 95.9  --  92.1 90.5  PLT 337  --  308 289   < > = values in this interval not displayed.   Basic Metabolic Panel Recent Labs    22/63/33 0831 04/07/21 0151  NA 128* 128*  K 3.9 3.1*  CL 91* 89*  CO2 25 26  GLUCOSE 231* 222*  BUN 35* 33*  CREATININE 1.49* 1.27*  CALCIUM 8.8* 8.5*  MG 1.7 1.8  PHOS 3.2  --    Liver Function Tests Recent Labs    04/05/21 0144 04/06/21 0831  AST 21 25  ALT 22 19  ALKPHOS 89 86  BILITOT 0.5 0.9  PROT 8.2* 7.5  ALBUMIN 3.2* 2.9*   No results for input(s): LIPASE, AMYLASE in the last 72 hours. Cardiac Enzymes No results for input(s): CKTOTAL, CKMB, CKMBINDEX, TROPONINI in the last 72 hours.  BNP: BNP (last 3 results) Recent Labs    10/28/20 1315 03/12/21 1505 04/05/21 0148  BNP 1,547.7* 448.3* 460.5*    ProBNP (last 3 results) No results for input(s): PROBNP in the last 8760 hours.   D-Dimer No results for input(s): DDIMER in the last 72 hours. Hemoglobin A1C Recent Labs    04/05/21 2016  HGBA1C 8.8*   Fasting Lipid Panel No results for input(s): CHOL, HDL, LDLCALC, TRIG, CHOLHDL, LDLDIRECT in the  last 72 hours. Thyroid Function Tests No results for input(s): TSH, T4TOTAL, T3FREE, THYROIDAB in the last 72 hours.  Invalid input(s): FREET3  Other results:   Imaging    No results found.   Medications:     Scheduled Medications:  amiodarone  100 mg Oral Daily   apixaban  5 mg Oral BID   digoxin  125 mcg Oral Daily   furosemide  80 mg Intravenous Q4H   insulin aspart  0-15 Units Subcutaneous TID WC   insulin aspart  0-5 Units Subcutaneous QHS   insulin aspart  5 Units Subcutaneous TID WC   insulin glargine-yfgn  20 Units Subcutaneous QHS   isosorbide-hydrALAZINE  1 tablet Oral TID   potassium chloride  40 mEq Oral BID   sodium chloride flush  3 mL Intravenous Q12H   spironolactone  12.5 mg Oral Daily    Infusions:  sodium chloride 10 mL/hr at 04/06/21 2130    ampicillin (OMNIPEN) IV 2 g (04/07/21 0826)   cefTRIAXone (ROCEPHIN)  IV 2 g (04/07/21 0927)   milrinone 0.5 mcg/kg/min (04/07/21 0607)    PRN Medications: sodium chloride, acetaminophen **OR** acetaminophen, senna-docusate    Patient Profile     Jonathan [name] is a 36 y.o.with a history of chronic HFrEF, NICM, HTN, asthma, OSA, morbid obesity, and uncontrolled DM.   On home milrinone.  Assessment/Plan   1.  End Stage HFrEF: Nonischemic cardiomyopathy. Echo back in 2021 EF 30-35 % with moderate RV dysfunction in the setting of recurrent RBBB with significant dyssynchrony.  Echo 10/2020 EF < 20% and moderate RV dysfunction. He has been on home milrinone 0.375 with [facility] following since 9/22.  Not thought to be candidate for advanced therapies with obesity and noncompliance. Echo in 1/23 reviewed, shows EF < 20%, mild LV dilation, severe RV dysfunction with moderate RV enlargement.  He has developed NYHA class IV symptoms with cardiogenic shock, co-ox 52% on admit with elevated lactate.  - No central access. Does not want central line. PICC line out. See below. - On home milrinone 0.375. Increased milrinone to 0.5 mcg/kg/min this admit. -- Volume status remains elevated. Continue lasix 80 mg every 4 hours.  K 3.1. Supp.  - Held entresto with AKI.  Cr improved. Plan to add back prior to discharge. - Increase spiro to 25 mg daily. - Continue digoxin 0.125. Dig level 0.4 - Intolerant SGLT2i due to candidiasis - Continue Bidil 1 tablet tid, BP stable. - No beta blocker d/t low output - Not candidate for transplant currently due to size (BMI 44). HgbA1c improved from > 14 to 8.8%.  Could consider LVAD, but RV function from 1/23 echo is quite poor and concerned that outcome would not be good.  I am afraid he may not have advanced options.  He has been with [facility]. Once diuresed, will plan to repeat RHC. 2. DM2: Hgb A1c 8.8%.  - No SGLT2i d/t candidiasis 3. AKI on CKD stage  3: Creatinine 2.14 initially, improved to 1.27 with diuresis and inotrope support - Follow closely.  4. Paroxysmal Atrial Flutter: Sinus tach today. - Continue amio to 100 mg daily.  - Continue Eliquis 5 mg bid. No bleeding issues  5. OSA: Severe OSA AHI 65 - Does not wear CPAP. 6. Obesity: Body mass index is 45 7.  GOC - Followed by [facility] for [facility].  8. ID - Bld CX. Enterococcus faecalis and Staph epidermidis bacteremia. ID following. Possible PICC colonization. On ceftriaxone + ampicllin. -  Recommending TEE to rule out endocarditis   BC need to be negative at least 48 hrs prior to replacing PICC. Repeat BC drawn yesterday.   ID recommending TEE to r/o endocarditis.   He is frustrated with multiple healthcare providers coming in and out of the room. Prefers to have one provider speak with him at a time.   Length of Stay: 2  [name], Jonathan Parents, PA-C  04/07/2021, 9:49 AM  Advanced Heart Failure Team Pager [phone] (M-F; 7a - 5p)  Please contact [facility] for night-coverage after hours (5p -7a ) and weekends on [email or web address]  Patient seen and examined with the above-signed Advanced Practice Provider and/or Housestaff. I personally reviewed laboratory data, imaging studies and relevant notes. I independently examined the patient and formulated the important aspects of the plan. I have edited the note to reflect any of my changes or salient points. I have personally discussed the plan with the patient and/or family.  Remains on milrinone 0.5. Diuresing well. Feels that breathing is getting better. Scr improving.    On IV ab. No fevers or chills  General:  Sitting up on side of bed. No resp difficulty HEENT: normal Neck: supple. JVP 8-9 Carotids 2+ bilat; no bruits. No lymphadenopathy or thryomegaly appreciated. Cor: PMI nondisplaced. Regular tachy Lungs: clear Abdomen: obese soft, nontender, nondistended. No hepatosplenomegaly. No bruits or masses. Good bowel  sounds. Extremities: no cyanosis, clubbing, rash, 1+ edema + ACE wraps Neuro: alert & orientedx3, cranial nerves grossly intact. moves all 4 extremities w/o difficulty. Affect pleasant  Volume status improving. Output appears stable on milrinone. We discussed need for TEE as well as need to consider advanced therapies (VAD). We also discussed that he may not be candidate for VAD due to RV dysfunction and social issues.  He is interested in both.   Will schedule TEE for next day or two. Will have VAD coordinators see him. Will need R/ cath later this week.   Glori Bickers, MD  11:42 PM

## 2021-04-08 DIAGNOSIS — G4733 Obstructive sleep apnea (adult) (pediatric): Secondary | ICD-10-CM | POA: Diagnosis not present

## 2021-04-08 DIAGNOSIS — I4892 Unspecified atrial flutter: Secondary | ICD-10-CM | POA: Diagnosis not present

## 2021-04-08 DIAGNOSIS — F32A Depression, unspecified: Secondary | ICD-10-CM | POA: Diagnosis not present

## 2021-04-08 DIAGNOSIS — I509 Heart failure, unspecified: Secondary | ICD-10-CM | POA: Diagnosis not present

## 2021-04-08 DIAGNOSIS — E669 Obesity, unspecified: Secondary | ICD-10-CM | POA: Diagnosis not present

## 2021-04-08 DIAGNOSIS — R7881 Bacteremia: Secondary | ICD-10-CM | POA: Diagnosis not present

## 2021-04-08 DIAGNOSIS — Z515 Encounter for palliative care: Secondary | ICD-10-CM | POA: Diagnosis not present

## 2021-04-08 DIAGNOSIS — R109 Unspecified abdominal pain: Secondary | ICD-10-CM | POA: Diagnosis not present

## 2021-04-08 DIAGNOSIS — I10 Essential (primary) hypertension: Secondary | ICD-10-CM | POA: Diagnosis not present

## 2021-04-08 DIAGNOSIS — E119 Type 2 diabetes mellitus without complications: Secondary | ICD-10-CM | POA: Diagnosis not present

## 2021-04-08 DIAGNOSIS — I5023 Acute on chronic systolic (congestive) heart failure: Secondary | ICD-10-CM | POA: Diagnosis not present

## 2021-04-08 DIAGNOSIS — I5084 End stage heart failure: Secondary | ICD-10-CM | POA: Diagnosis not present

## 2021-04-08 LAB — COMPREHENSIVE METABOLIC PANEL
ALT: 22 U/L (ref 0–44)
AST: 21 U/L (ref 15–41)
Albumin: 2.6 g/dL — ABNORMAL LOW (ref 3.5–5.0)
Alkaline Phosphatase: 87 U/L (ref 38–126)
Anion gap: 11 (ref 5–15)
BUN: 24 mg/dL — ABNORMAL HIGH (ref 6–20)
CO2: 30 mmol/L (ref 22–32)
Calcium: 8.8 mg/dL — ABNORMAL LOW (ref 8.9–10.3)
Chloride: 88 mmol/L — ABNORMAL LOW (ref 98–111)
Creatinine, Ser: 1.06 mg/dL (ref 0.61–1.24)
GFR, Estimated: >60 mL/min (ref >60)
Glucose, Bld: 199 mg/dL — ABNORMAL HIGH (ref 70–99)
Potassium: 3.1 mmol/L — ABNORMAL LOW (ref 3.5–5.1)
Sodium: 129 mmol/L — ABNORMAL LOW (ref 135–145)
Total Bilirubin: 0.3 mg/dL (ref 0.3–1.2)
Total Protein: 6.8 g/dL (ref 6.5–8.1)

## 2021-04-08 LAB — CBC
HCT: 31.9 % — ABNORMAL LOW (ref 39.0–52.0)
Hemoglobin: 10.7 g/dL — ABNORMAL LOW (ref 13.0–17.0)
MCH: 30.4 pg (ref 26.0–34.0)
MCHC: 33.5 g/dL (ref 30.0–36.0)
MCV: 90.6 fL (ref 80.0–100.0)
Platelets: 312 10*3/uL (ref 150–400)
RBC: 3.52 MIL/uL — ABNORMAL LOW (ref 4.22–5.81)
RDW: 13.4 % (ref 11.5–15.5)
WBC: 8.5 10*3/uL (ref 4.0–10.5)
nRBC: 0 % (ref 0.0–0.2)

## 2021-04-08 LAB — GLUCOSE, CAPILLARY
Glucose-Capillary: 215 mg/dL — ABNORMAL HIGH (ref 70–99)
Glucose-Capillary: 294 mg/dL — ABNORMAL HIGH (ref 70–99)
Glucose-Capillary: 198 mg/dL — ABNORMAL HIGH (ref 70–99)
Glucose-Capillary: 208 mg/dL — ABNORMAL HIGH (ref 70–99)

## 2021-04-08 LAB — POTASSIUM: Potassium: 3.7 mmol/L (ref 3.5–5.1)

## 2021-04-08 LAB — MAGNESIUM: Magnesium: 1.7 mg/dL (ref 1.7–2.4)

## 2021-04-08 MED ORDER — SODIUM CHLORIDE 0.45 % IV SOLN: INTRAVENOUS | Status: DC

## 2021-04-08 MED ORDER — SACUBITRIL-VALSARTAN 24-26 MG PO TABS
1.0000 | ORAL_TABLET | Freq: Two times a day (BID) | ORAL | Status: DC
  Administered 2021-04-08 – 2021-04-15 (×15): 1 via ORAL
  Filled 2021-04-08 (×15): qty 1

## 2021-04-08 MED ORDER — DULAGLUTIDE 1.5 MG/0.5ML ~~LOC~~ SOAJ
1.5000 mg | SUBCUTANEOUS | Status: DC
  Administered 2021-04-08: 1.5 mg via SUBCUTANEOUS

## 2021-04-08 MED ORDER — MAGNESIUM SULFATE 2 GM/50ML IV SOLN: 2.0000 g | Freq: Once | INTRAVENOUS | Status: DC

## 2021-04-08 MED ORDER — MAGNESIUM SULFATE 2 GM/50ML IV SOLN
2.0000 g | Freq: Once | INTRAVENOUS | Status: AC
  Administered 2021-04-08: 2 g via INTRAVENOUS
  Filled 2021-04-08: qty 50

## 2021-04-08 MED ORDER — INSULIN ASPART 100 UNIT/ML IJ SOLN
8.0000 [IU] | Freq: Three times a day (TID) | INTRAMUSCULAR | Status: DC
  Administered 2021-04-08 – 2021-04-09 (×4): 8 [IU] via SUBCUTANEOUS

## 2021-04-08 MED ORDER — POTASSIUM CHLORIDE CRYS ER 20 MEQ PO TBCR
60.0000 meq | EXTENDED_RELEASE_TABLET | Freq: Once | ORAL | Status: AC
  Administered 2021-04-08: 60 meq via ORAL
  Filled 2021-04-08: qty 3

## 2021-04-08 MED ORDER — POTASSIUM CHLORIDE CRYS ER 20 MEQ PO TBCR
40.0000 meq | EXTENDED_RELEASE_TABLET | ORAL | Status: AC
  Administered 2021-04-08 (×2): 40 meq via ORAL
  Filled 2021-04-08 (×2): qty 2

## 2021-04-08 MED ORDER — INSULIN GLARGINE-YFGN 100 UNIT/ML ~~LOC~~ SOLN
25.0000 [IU] | Freq: Every day | SUBCUTANEOUS | Status: DC
  Administered 2021-04-08: 25 [IU] via SUBCUTANEOUS
  Filled 2021-04-08 (×4): qty 0.25

## 2021-04-08 NOTE — Anesthesia Preprocedure Evaluation (Addendum)
Anesthesia Evaluation  Patient identified by MRN, date of birth, ID band Patient awake    Reviewed: Allergy & Precautions, H&P , NPO status , Patient's Chart, lab work & pertinent test results  Airway Mallampati: II  TM Distance: >3 FB Neck ROM: Full    Dental no notable dental hx. (+) Teeth Intact, Dental Advisory Given, Missing   Pulmonary neg pulmonary ROS, asthma , sleep apnea ,    Pulmonary exam normal breath sounds clear to auscultation       Cardiovascular Exercise Tolerance: Good hypertension, Pt. on medications +CHF  negative cardio ROS Normal cardiovascular exam+ dysrhythmias Atrial Fibrillation  Rhythm:Regular Rate:Normal  ECHO 1/23 1. No left ventricular thrombus (Definity used). Septal motion suggests  high right ventricular diastolic pressures. Left ventricular ejection  fraction, by estimation, is <20%. The left ventricle has severely  decreased function. The left ventricle  demonstrates global hypokinesis. The left ventricular internal cavity size  was mildly dilated. Indeterminate diastolic filling due to E-A fusion.  2. Right ventricular systolic function is severely reduced. The right  ventricular size is moderately enlarged. There is mildly elevated  pulmonary artery systolic pressure.  3. Left atrial size was severely dilated.  4. The mitral valve is normal in structure. Mild to moderate mitral valve  regurgitation.  5. The aortic valve is tricuspid. Aortic valve regurgitation is not  visualized. No aortic stenosis is present.    Neuro/Psych PSYCHIATRIC DISORDERS Depression  Neuromuscular disease negative neurological ROS  negative psych ROS   GI/Hepatic negative GI ROS, Neg liver ROS,   Endo/Other  negative endocrine ROSdiabetes, Type 2Morbid obesity  Renal/GU Renal diseasenegative Renal ROS  negative genitourinary   Musculoskeletal negative musculoskeletal ROS (+)   Abdominal    Peds negative pediatric ROS (+)  Hematology negative hematology ROS (+)   Anesthesia Other Findings   Reproductive/Obstetrics negative OB ROS                            Anesthesia Physical Anesthesia Plan  ASA: 4  Anesthesia Plan: MAC   Post-op Pain Management: Minimal or no pain anticipated   Induction: Intravenous  PONV Risk Score and Plan: 2  Airway Management Planned: Mask, Natural Airway and Nasal Cannula  Additional Equipment: None  Intra-op Plan:   Post-operative Plan:   Informed Consent: I have reviewed the patients History and Physical, chart, labs and discussed the procedure including the risks, benefits and alternatives for the proposed anesthesia with the patient or authorized representative who has indicated his/her understanding and acceptance.       Plan Discussed with: Anesthesiologist, CRNA and Surgeon  Anesthesia Plan Comments: (36 year old male with severe nonischemic cardiomyopathy on chronic outpatient milrinone therapy and follows closely with Dr. Haroldine Laws, he also has a history of type 2 diabetes historically poorly controlled but recently improved he presented for evaluation of shortness of breath associated with weight gain.  He is accompanied by his wife.  HPI is otherwise well documented by Dr. Penny Pia I agree with her physical exam and overall plan.  He has already had excellent urine output with IV Lasix diuresis.  We have consulted cardiology/advanced heart failure for additional recommendations he will remain in the interim on milrinone, home digoxin and IV Lasix.)       Anesthesia Quick Evaluation

## 2021-04-08 NOTE — Progress Notes (Signed)
Physical Therapy Treatment and D/C Patient Details Name: Jonathan [name] MRN: [id number] DOB: 07-24-1985 Today's Date: 04/08/2021   History of Present Illness 36 y.o. male presents to [facility] hospital on [id number] with DOE and difficulty moving. Pt admitted for management of acute on chronic end-stage heart failure. PMH includes DMII, obesity, HTN, OSA.    PT Comments    Pt admitted with above diagnosis. Pt was able to ambulate without device and withstand challenges to balance without assist as well. Pt met goals set and is at baseline. Will d/C PT.  Mobility team will continue to see pt.  D/C PT.   Recommendations for follow up therapy are one component of a multi-disciplinary discharge planning process, led by the attending physician.  Recommendations may be updated based on patient status, additional functional criteria and insurance authorization.  Follow Up Recommendations  No PT follow up     Assistance Recommended at Discharge PRN  Patient can return home with the following A little help with walking and/or transfers;A little help with bathing/dressing/bathroom;Assistance with cooking/housework   Equipment Recommendations  None recommended by PT    Recommendations for Other Services       Precautions / Restrictions Precautions Precautions: Fall Precaution Comments: monitor SpO2 and HR Restrictions Weight Bearing Restrictions: No     Mobility  Bed Mobility Overal bed mobility: Needs Assistance Bed Mobility: Supine to Sit     Supine to sit: Independent          Transfers Overall transfer level: Needs assistance Equipment used: None Transfers: Sit to/from Stand Sit to Stand: Independent           General transfer comment: Did not need assist    Ambulation/Gait Ambulation/Gait assistance: Independent Gait Distance (Feet): 400 Feet Assistive device: None Gait Pattern/deviations: Step-through pattern, Wide base of support Gait velocity: functional Gait  velocity interpretation: 1.31 - 2.62 ft/sec, indicative of limited community ambulator   General Gait Details: pt with steady step-through gait, widened BOS with mild increase in lateral sway. Pt agrees he is at baseline.  Can withstand challenges to balance.   Stairs Stairs:  (declines steps as he has one step at home.)           Wheelchair Mobility    Modified Rankin (Stroke Patients Only)       Balance Overall balance assessment: Needs assistance Sitting-balance support: No upper extremity supported, Feet supported Sitting balance-Leahy Scale: Good     Standing balance support: No upper extremity supported, During functional activity Standing balance-Leahy Scale: Fair                              Cognition Arousal/Alertness: Awake/alert Behavior During Therapy: Flat affect Overall Cognitive Status: Within Functional Limits for tasks assessed                                          Exercises      General Comments General comments (skin integrity, edema, etc.): VSS on RA.  DOE 2/4      Pertinent Vitals/Pain Pain Assessment Pain Assessment: No/denies pain    Home Living                          Prior Function            PT Goals (current goals  can now be found in the care plan section) Acute Rehab PT Goals Patient Stated Goal: to return to independence and go home PT Goal Formulation: All assessment and education complete, DC therapy Progress towards PT goals: Goals met/education completed, patient discharged from PT    Frequency    Min 3X/week      PT Plan Current plan remains appropriate    Co-evaluation              AM-PAC PT "6 Clicks" Mobility   Outcome Measure  Help needed turning from your back to your side while in a flat bed without using bedrails?: None Help needed moving from lying on your back to sitting on the side of a flat bed without using bedrails?: None Help needed moving to  and from a bed to a chair (including a wheelchair)?: None Help needed standing up from a chair using your arms (e.g., wheelchair or bedside chair)?: None Help needed to walk in hospital room?: None Help needed climbing 3-5 steps with a railing? : None 6 Click Score: 24    End of Session   Activity Tolerance: Patient tolerated treatment well Patient left: in bed;with call bell/phone within reach;with family/visitor present Nurse Communication: Mobility status PT Visit Diagnosis: Other abnormalities of gait and mobility (R26.89)     Time: 2376-2831 PT Time Calculation (min) (ACUTE ONLY): 11 min  Charges:  $Gait Training: 8-22 mins                     Mishal Probert M,PT Acute Rehab Services [phone] (pager)    Alvira Philips 04/08/2021, 1:51 PM

## 2021-04-08 NOTE — Plan of Care (Signed)

## 2021-04-08 NOTE — Progress Notes (Signed)
Mobility Specialist Progress Note:   04/08/21 1245  Mobility  Activity Ambulated with assistance in hallway  Level of Assistance Standby assist, set-up cues, supervision of patient - no hands on  Assistive Device None  Distance Ambulated (ft) 500 ft  Activity Response Tolerated well  $Mobility charge 1 Mobility   Pt asx during ambulation. Back sitting EOB eating lunch.   Addison Lank Acute Rehab Phone: [phone] Office Phone: [phone]

## 2021-04-08 NOTE — TOC Initial Note (Signed)
Transition of Care [facility]) - Initial/Assessment Note    Patient Details  Name: Jonathan [name] MRN: [id number] Date of Birth: Aug 26, 1985  Transition of Care [facility]) CM/SW Contact:    Erenest Rasher, RN Phone Number: [phone] 04/08/2021, 8:24 AM  Clinical Narrative:                  HF TOC CM spoke to pt at bedside. He is active with [facility] and [facility] for Home Milrinone. Has needed DME in the home. Spoke to [employer], Carolynn Sayers RN and they are following for resumption of care. Will need HH and IV Milrinone resumption of care orders.     Expected Discharge Plan: [place] Barriers to Discharge: Continued Medical Work up   Patient Goals and CMS Choice Patient states their goals for this hospitalization and ongoing recovery are:: return home, wants to get better CMS Medicare.gov Compare Post Acute Care list provided to:: Patient Choice offered to / list presented to : Patient  Expected Discharge Plan and Services Expected Discharge Plan: [place]   Discharge Planning Services: CM Consult Post Acute Care Choice: [place] arrangements for the past 2 months: [place]: RN [place] Agency: [place], [facility] Date [facility] Agency Contacted: 04/07/21 Time HH Agency Contacted: 1700 Representative spoke with at [place]: Carolynn Sayers, Esther Hardy  Prior Living Arrangements/Services Living arrangements for the past 2 months: Single Family Home Lives with:: Spouse   Do you feel safe going back to the place where you live?: Yes      Need for Family Participation in Patient Care: No (Comment) Care giver support system in place?: No (comment) Current home services:  (Bedside commode, ortho boot, Bipap, Rolling Walker) Criminal Activity/Legal Involvement Pertinent to Current Situation/Hospitalization: No - Comment as needed  Activities of Daily  Living Home Assistive Devices/Equipment: Environmental consultant (specify type), Bedside commode/3-in-1, Blood pressure cuff, CBG Meter, Hand-held shower hose, Eyeglasses, BIPAP ADL Screening (condition at time of admission) Patient's cognitive ability adequate to safely complete daily activities?: Yes Is the patient deaf or have difficulty hearing?: No Does the patient have difficulty seeing, even when wearing glasses/contacts?: No Does the patient have difficulty concentrating, remembering, or making decisions?: No Patient able to express need for assistance with ADLs?: Yes Does the patient have difficulty dressing or bathing?: No (trouble with socks and shoes) Independently performs ADLs?: Yes (appropriate for developmental age) Does the patient have difficulty walking or climbing stairs?: Yes Weakness of Legs: Both (also some SOB) Weakness of Arms/Hands: Both  Permission Sought/Granted Permission sought to share information with : Case Manager, PCP, Family Supports Permission granted to share information with : Yes, Verbal Permission Granted  Share Information with NAME: Wesly Counihan  Permission granted to share info w AGENCY: [place] granted to share info w Relationship: wife  Permission granted to share info w Contact Information: [phone]  Emotional Assessment Appearance:: Appears stated age Attitude/Demeanor/Rapport: Engaged Affect (typically observed): Accepting Orientation: : Oriented to Self, Oriented to Place, Oriented to  Time, Oriented to Situation   Psych Involvement: No (comment)  Admission diagnosis:  Hyperkalemia [E87.5] Acute exacerbation of CHF (congestive heart failure) (HCC) [I50.9] Acute renal failure, unspecified acute renal failure type ([place]) [N17.9] Decompensated heart failure ([place]) [I50.9] Patient Active  Problem List   Diagnosis Date Noted   Positive blood culture    Acute exacerbation of CHF (congestive heart failure) ([place]) 04/05/2021    Hyponatremia with excess extracellular fluid volume 04/05/2021   Acute renal failure (HCC)    Hyperkalemia    Neck pain on left side 02/18/2021   Atrial flutter ([place]) 12/10/2020   Diabetes mellitus ([place])    Goals of care, counseling/discussion    Acute on chronic heart failure ([place]) 10/28/2020   Intertrigo 10/17/2020   Mold exposure 06/17/2020   Depression 11/06/2017   Charcot ankle, left 05/31/2017   Diabetic polyneuropathy associated with type 2 diabetes mellitus ([place]) 03/14/2017   OSA (obstructive sleep apnea) 99991111   Chronic systolic (congestive) heart failure ([place])    Morbid obesity with BMI of 50.0-59.9, adult ([place]) 10/26/2016   Asthma, chronic, mild persistent, uncomplicated [id number]   Essential hypertension 10/26/2016   Diabetes mellitus type 2 in obese ([place]) 04/20/2016   PCP:  [name], MD Pharmacy:   [facility] [address] [place] [address] Phone: [phone] Fax: [phone]     Social Determinants of Health (SDOH) Interventions    Readmission Risk Interventions No flowsheet data found.

## 2021-04-08 NOTE — Progress Notes (Addendum)
Advanced Heart Failure Rounding Note  PCP-Cardiologist: Fransico Him, MD   Subjective:    Continues on milrinone 0.5. PICC out, no co-ox.  Continues to diurese well. 4.8L yesterday. Weight down another 3 lb.   Scr down to 1.06. K 3.1 and mag 1.7.  BP stable.    Objective:   Weight Range: (!) 159.9 kg Body mass index is 45.27 kg/m.   Vital Signs:   Temp:  [97.9 F (36.6 C)-98.5 F (36.9 C)] 97.9 F (36.6 C) (02/22 0937) Pulse Rate:  [102-111] 111 (02/22 0937) Resp:  [16-20] 16 (02/22 0937) BP: (108-132)/(66-88) 132/79 (02/22 0937) SpO2:  [95 %-97 %] 96 % (02/22 0937) Weight:  [159.9 kg] 159.9 kg (02/22 0412) Last BM Date : 04/07/21  Weight change: Filed Weights   04/06/21 0317 04/07/21 0400 04/08/21 0412  Weight: (!) 162.9 kg (!) 161.2 kg (!) 159.9 kg    Intake/Output:   Intake/Output Summary (Last 24 hours) at 04/08/2021 1239 Last data filed at 04/08/2021 1045 Gross per 24 hour  Intake 1772.99 ml  Output 6475 ml  Net -4702.01 ml      Physical Exam    General:  Sitting on side of bed. No distress. HEENT: normal Neck: supple. JVP 10 cm. Carotids 2+ bilat; no bruits.  Cor: PMI nondisplaced. Regular rhythm, tachy. No rubs, gallops or murmurs. Lungs: clear Abdomen: soft, nontender, nondistended. No hepatosplenomegaly.  Extremities: no cyanosis, clubbing, rash, 1+ edema Neuro: alert & orientedx3, cranial nerves grossly intact. moves all 4 extremities w/o difficulty. Affect pleasant    Telemetry   Sinus tach 100s-110s, 10 PVCs/min  EKG   N/A  Labs    CBC Recent Labs    04/07/21 0151 04/08/21 0422  WBC 8.1 8.5  HGB 10.7* 10.7*  HCT 31.3* 31.9*  MCV 90.5 90.6  PLT 289 123456   Basic Metabolic Panel Recent Labs    04/06/21 0831 04/07/21 0151 04/08/21 0422  NA 128* 128* 129*  K 3.9 3.1* 3.1*  CL 91* 89* 88*  CO2 25 26 30   GLUCOSE 231* 222* 199*  BUN 35* 33* 24*  CREATININE 1.49* 1.27* 1.06  CALCIUM 8.8* 8.5* 8.8*  MG 1.7 1.8 1.7   PHOS 3.2  --   --    Liver Function Tests Recent Labs    04/06/21 0831 04/08/21 0422  AST 25 21  ALT 19 22  ALKPHOS 86 87  BILITOT 0.9 0.3  PROT 7.5 6.8  ALBUMIN 2.9* 2.6*   No results for input(s): LIPASE, AMYLASE in the last 72 hours. Cardiac Enzymes No results for input(s): CKTOTAL, CKMB, CKMBINDEX, TROPONINI in the last 72 hours.  BNP: BNP (last 3 results) Recent Labs    10/28/20 1315 03/12/21 1505 04/05/21 0148  BNP 1,547.7* 448.3* 460.5*    ProBNP (last 3 results) No results for input(s): PROBNP in the last 8760 hours.   D-Dimer No results for input(s): DDIMER in the last 72 hours. Hemoglobin A1C Recent Labs    04/05/21 2016  HGBA1C 8.8*   Fasting Lipid Panel No results for input(s): CHOL, HDL, LDLCALC, TRIG, CHOLHDL, LDLDIRECT in the last 72 hours. Thyroid Function Tests No results for input(s): TSH, T4TOTAL, T3FREE, THYROIDAB in the last 72 hours.  Invalid input(s): FREET3  Other results:   Imaging    No results found.   Medications:     Scheduled Medications:  amiodarone  100 mg Oral Daily   apixaban  5 mg Oral BID   digoxin  125 mcg Oral  Daily   furosemide  80 mg Intravenous Q4H   insulin aspart  0-15 Units Subcutaneous TID WC   insulin aspart  0-5 Units Subcutaneous QHS   insulin aspart  8 Units Subcutaneous TID WC   insulin glargine-yfgn  25 Units Subcutaneous QHS   isosorbide-hydrALAZINE  1 tablet Oral TID   sodium chloride flush  3 mL Intravenous Q12H   spironolactone  25 mg Oral Daily    Infusions:  sodium chloride 10 mL/hr at 04/06/21 2130   ampicillin (OMNIPEN) IV 2 g (04/08/21 1157)   cefTRIAXone (ROCEPHIN)  IV 2 g (04/08/21 0947)   milrinone 0.5 mcg/kg/min (04/08/21 1234)    PRN Medications: sodium chloride, acetaminophen **OR** acetaminophen, senna-docusate    Patient Profile     Jonathan [name] is a 36 y.o.with a history of chronic HFrEF, NICM, HTN, asthma, OSA, morbid obesity, and uncontrolled DM.   On home  milrinone.  Assessment/Plan   1.  End Stage HFrEF: Nonischemic cardiomyopathy. Echo back in 2021 EF 30-35 % with moderate RV dysfunction in the setting of recurrent RBBB with significant dyssynchrony.  Echo 10/2020 EF < 20% and moderate RV dysfunction. He has been on home milrinone 0.375 with [facility] following since 9/22.  Not thought to be candidate for advanced therapies with obesity and noncompliance. Echo in 1/23 reviewed, shows EF < 20%, mild LV dilation, severe RV dysfunction with moderate RV enlargement.  He has developed NYHA class IV symptoms with cardiogenic shock, co-ox 52% on admit with elevated lactate.  - No central access. Does not want central line. PICC line out. See below. - On home milrinone 0.375. Increased milrinone to 0.5 mcg/kg/min this admit. -- Volume status remains elevated. Continue lasix 80 mg every 4 hours.  K 3.1 and mag 1.7. Supp aggressively. - Restart entresto 24/26 mg BID - Increase spiro to 25 mg daily. - Continue digoxin 0.125. Dig level 0.4 - Intolerant SGLT2i due to candidiasis - Continue Bidil 1 tablet tid, BP stable. - No beta blocker d/t low output - Not candidate for transplant currently due to size (BMI 44). HgbA1c improved from > 14 to 8.8%.  Could consider LVAD, but RV function from 1/23 echo is quite poor and concerned that outcome would not be good. He has been with [facility]. Continue diuresis and plan for [facility] on Friday, 02/24. If felt to be potential candidate for VAD, would need to complete IV abx and have negative f/u BC. 2. DM2: Hgb A1c 8.8%.  - No SGLT2i d/t candidiasis 3. AKI on CKD stage 3: Creatinine 2.14 initially, improved to 1.06 with diuresis and inotrope support - Follow closely.  4. Paroxysmal Atrial Flutter: Sinus tach today. - Continue amio to 100 mg daily.  - Continue Eliquis 5 mg bid. No bleeding issues  5. OSA: Severe OSA AHI 65 - Does not wear CPAP. 6. Obesity: Body mass index is 45 7. ID - Bld CX.  Enterococcus faecalis and Staph epidermidis bacteremia. ID following. Possible PICC colonization. On ceftriaxone + ampicllin. - BC 02/19 and 02/20 with NGTD - TEE tomorrow to assess for endocarditis 8. PVCs - ~10/min on telemetry - K 3.1 and mag 1.7. Supp today - On amiodarone 100 mg daily. For AFL.   ID recommending TEE to r/o endocarditis.   He is frustrated with multiple healthcare providers coming in and out of the room. Prefers to have one provider speak with him at a time.   Length of Stay: 3  FINCH, LINDSAY N, PA-C  04/08/2021, 12:39 PM  Advanced Heart Failure Team Pager [phone] (M-F; 7a - 5p)  Please contact [facility] for night-coverage after hours (5p -7a ) and weekends on [email or web address]  Patient seen and examined with the above-signed Advanced Practice Provider and/or Housestaff. I personally reviewed laboratory data, imaging studies and relevant notes. I independently examined the patient and formulated the important aspects of the plan. I have edited the note to reflect any of my changes or salient points. I have personally discussed the plan with the patient and/or family.  Remains on milrinone 0.5. Diuresing well. Scr improved. No fevers or chills. F/u cultures negative.   General:  Sitting on side of bed No resp difficulty HEENT: normal Neck: supple. JVP to jaw Carotids 2+ bilat; no bruits. No lymphadenopathy or thryomegaly appreciated. Cor: Regular rate & rhythm. No rubs, gallops or murmurs. Lungs: clear Abdomen: obese soft, nontender, nondistended. No hepatosplenomegaly. No bruits or masses. Good bowel sounds. Extremities: no cyanosis, clubbing, rash, 1-2+ edema + coban wraps Neuro: alert & orientedx3, cranial nerves grossly intact. moves all 4 extremities w/o difficulty. Affect pleasant  Improved on milrinone 0.5. Remains on IV abx. Plan TEE tomorrow to look for endocarditis. Plan R/L cath on Friday to help decide if he may be a VAD candidate.   Glori Bickers, MD  3:10 PM

## 2021-04-08 NOTE — Progress Notes (Signed)
Physical Therapy Discharge Patient Details Name: Chrles Selley MRN: [id number] DOB: 10-31-1985 Today's Date: 04/08/2021 Time: 7034-0352 PT Time Calculation (min) (ACUTE ONLY): 11 min  Patient discharged from PT services secondary to goals met and no further PT needs identified.  Please see latest therapy progress note for current level of functioning and progress toward goals.    Progress and discharge plan discussed with patient and/or caregiver: Patient/Caregiver agrees with plan  GP     Alvira Philips 04/08/2021, 1:54 PM  Tyler Run M,PT Acute Rehab Services [phone] (pager)

## 2021-04-08 NOTE — Progress Notes (Signed)
HD#3 SUBJECTIVE:  Patient Summary: Jonathan [name] is a 36 yo male with nonischemic cardiomyopathy, end-stage heart failure on a milrinone drip at home, type 2 diabetes, and obesity who presents to [facility] with dyspnea on exertion and difficulty moving over the last few days.   Overnight Events: no acute events overnight    Interm History: patient seen and evaluated. Had worked with PT and able to walk comfortably down the hall. No complaints. Requesting to use home Trulicity.   OBJECTIVE:  Vital Signs: Vitals:   04/08/21 0350 04/08/21 0412 04/08/21 0937 04/08/21 1454  BP: 108/66  132/79 (!) 97/55  Pulse:   (!) 111   Resp: 18  16   Temp: 98.3 F (36.8 C)  97.9 F (36.6 C)   TempSrc: Oral  Oral   SpO2: 95%  96%   Weight:  (!) 159.9 kg    Height:       SpO2: 96 % O2 Flow Rate (L/min): 2 L/min  Filed Weights   04/06/21 0317 04/07/21 0400 04/08/21 0412  Weight: (!) 162.9 kg (!) 161.2 kg (!) 159.9 kg     Intake/Output Summary (Last 24 hours) at 04/08/2021 1525 Last data filed at 04/08/2021 1435 Gross per 24 hour  Intake 1892.99 ml  Output 6500 ml  Net -4607.01 ml   Net IO Since Admission: -18,057.18 mL [04/08/21 1525]  Physical Exam:  General: alert and oriented, no acute distress HEENT: PEERL Neck: supple Cardiac: tachycardic, regular rhythm, no MRG Lungs: CTAB, no wheeze, rales, or rhnonchi, on supplemental O2 [place] Abd: soft, NT, not distended, + BS Extremities: 1+ edema to upper shin 2+ lower shin, wraps in place. Neuro: alert and oriented, moves all extremities Psych: mood and affect appropriate.   Patient Lines/Drains/Airways Status     Active Line/Drains/Airways     Name Placement date Placement time Site Days   PICC Double Lumen 10/28/20 PICC Right Cephalic 39 cm 0 cm [id number]  1851  -- 159            Pertinent Labs: CBC Latest Ref Rng & Units 04/08/2021 04/07/2021 04/06/2021  WBC 4.0 - 10.5 K/uL 8.5 8.1 9.3  Hemoglobin 13.0 - 17.0 g/dL 10.7(L)  10.7(L) 10.8(L)  Hematocrit 39.0 - 52.0 % 31.9(L) 31.3(L) 32.6(L)  Platelets 150 - 400 K/uL 312 289 308    CMP Latest Ref Rng & Units 04/08/2021 04/07/2021 04/06/2021  Glucose 70 - 99 mg/dL 199(H) 222(H) 231(H)  BUN 6 - 20 mg/dL 24(H) 33(H) 35(H)  Creatinine 0.61 - 1.24 mg/dL 1.06 1.27(H) 1.49(H)  Sodium 135 - 145 mmol/L 129(L) 128(L) 128(L)  Potassium 3.5 - 5.1 mmol/L 3.1(L) 3.1(L) 3.9  Chloride 98 - 111 mmol/L 88(L) 89(L) 91(L)  CO2 22 - 32 mmol/L 30 26 25   Calcium 8.9 - 10.3 mg/dL 8.8(L) 8.5(L) 8.8(L)  Total Protein 6.5 - 8.1 g/dL 6.8 - 7.5  Total Bilirubin 0.3 - 1.2 mg/dL 0.3 - 0.9  Alkaline Phos 38 - 126 U/L 87 - 86  AST 15 - 41 U/L 21 - 25  ALT 0 - 44 U/L 22 - 19    Recent Labs    04/07/21 2110 04/08/21 0618 04/08/21 1047  GLUCAP 207* 215* 294*     Pertinent Imaging: No results found.  ASSESSMENT/PLAN:  Assessment: Active Problems:   Diabetes mellitus type 2 in obese [facility])   Essential hypertension   Acute exacerbation of CHF (congestive heart failure) (HCC)   Hyponatremia with excess extracellular fluid volume   Positive blood  culture  # acute on chronic end stage heart failure with reduced EF Noted to be approximately 15 lbs over dry weight. He has had good urine output approximately 7L yesterday. He appears to be making improvements daily. He was able to ambulate down the hallway with PT this morning without any supplemental O2.  - Heart failure is following. He is currently receiving milrinone at 0.8mccg/kg/min.  Lasix being given in 80mg  pushes Q4hr in between pausing milrinone due to lack of central access. PICC line tomorrow. No CO-OX available at this time either. If patient does require better access in emergent situation, central access can be obtained via IJ. On Digoxin 0.125mg , Bidil TID. Arlyce Harman 25mg  daily. - AKI has resolved and his entresto has been restarted 24/26 BID - Unable to start patient on SGLT2 due to candidasis. BB contraindicated due to low  output.  - Unfortunately, patient is not a candidate for transplant. HF planning for R/L cath on Friday to decide if VAD candidate.  - will continue I+Os, daily weights - once euvolemia is achieved, he will be evaluated with RHC.   Possible Bacteremia  +Blood Cultures Blood cultures positive for E faecalis and staph epidermidis in one set of blood cultures drawn through is PICC line on admission. PICC line has been pulled. Line may be colonized as opposed to true bacteremia as patient is not febrile nor reporting symptoms. He has had a leukocytosis. Blood cultures have been negative thus far.Marland Kitchen He is  on ampicillin for E faecalis and ceftriaxone for empiric endocarditis therapy and staph epi coverage. TEE to be performed tomorrow to evaluate for IE Repeat blood cultures need to be drawn yesterday showing no growth <24hrs. If these remain negative for 48 hours minimum from today, can then proceed with PICC. If he becomes unstable can do IJ central access.  - F/u blood cultures. Fortunately they have been negative thus far.  - Continue Antibiotics per ID - Plan for likely TEE tomorrow  # Hx atrial flutter - Patient is currently in normal sinus. He is on 100mg  amio daily as well as Eliquis.   # AKI - suspect cardiorenal as his serum creatinine has shown improvements with continued diuresis. Cannot exclude low output HF however as he has been on increased milrinone. Will need to continue to monitor closely with daily BMPs.   Hyperkalemia - 3.1 this AM due to aggressive diuresis. Repleted. Will need to continue to monitor,.   Type 2 diabetes On sliding scale with meals and bedtime. Glucose has been running high in 200's despite [place]. Okay to take Trulicity. Continue CBG checks.   Signature: Delene Ruffini, MD  Internal Medicine Resident, PGY-1 [facility]  Pager: [phone] 3:25 PM, 04/08/2021   Please contact the on call pager after 5 pm and on weekends at  [phone].

## 2021-04-08 NOTE — Progress Notes (Signed)
Brief ID note:  Chart reviewed.  Patient's follow-up blood cultures from 2/19 and 2/20 are currently no growth to date.  Remains on ampicillin and ceftriaxone.  Planning for transesophageal echocardiogram tomorrow to further evaluate for infective endocarditis.  Once these results are back we will be able to provide more definitive recommendations on antibiotic course, duration, and mode of delivery.   Vedia Coffer for Infectious Disease [facility] 04/08/2021, 11:42 AM

## 2021-04-08 NOTE — Progress Notes (Signed)
OT Cancellation Note  Patient Details Name: Jonathan [name] MRN: [id number] DOB: [birth year]   Cancelled Treatment:    Reason Eval/Treat Not Completed: Patient at procedure or test/ unavailable Attempted x2, patient had just finished working with mobility, and then patient needed to have lab cultures drawn on second attempt. Will follow up as time allows.   Pollyann Glen E. Lyle Niblett, OTR/L Acute Rehabilitation Services [phone]   Cherlyn Cushing 04/08/2021, 2:35 PM

## 2021-04-09 ENCOUNTER — Encounter (HOSPITAL_COMMUNITY): Payer: Self-pay | Admitting: Internal Medicine

## 2021-04-09 ENCOUNTER — Inpatient Hospital Stay (HOSPITAL_COMMUNITY): Payer: Medicaid Other

## 2021-04-09 ENCOUNTER — Inpatient Hospital Stay (HOSPITAL_COMMUNITY): Payer: Medicaid Other | Admitting: Anesthesiology

## 2021-04-09 ENCOUNTER — Encounter (HOSPITAL_COMMUNITY)
Admission: EM | Disposition: A | Discharge status: Hospice | Payer: Self-pay | Source: Home / Self Care | Attending: Internal Medicine

## 2021-04-09 ENCOUNTER — Inpatient Hospital Stay: Payer: Self-pay

## 2021-04-09 ENCOUNTER — Encounter (HOSPITAL_COMMUNITY): Payer: Medicaid Other | Admitting: Internal Medicine

## 2021-04-09 ENCOUNTER — Other Ambulatory Visit (HOSPITAL_COMMUNITY): Payer: Self-pay

## 2021-04-09 DIAGNOSIS — R7881 Bacteremia: Secondary | ICD-10-CM | POA: Diagnosis not present

## 2021-04-09 DIAGNOSIS — N179 Acute kidney failure, unspecified: Secondary | ICD-10-CM | POA: Diagnosis not present

## 2021-04-09 DIAGNOSIS — R109 Unspecified abdominal pain: Secondary | ICD-10-CM | POA: Diagnosis not present

## 2021-04-09 DIAGNOSIS — G4733 Obstructive sleep apnea (adult) (pediatric): Secondary | ICD-10-CM | POA: Diagnosis not present

## 2021-04-09 DIAGNOSIS — F32A Depression, unspecified: Secondary | ICD-10-CM | POA: Diagnosis not present

## 2021-04-09 DIAGNOSIS — I110 Hypertensive heart disease with heart failure: Secondary | ICD-10-CM | POA: Diagnosis not present

## 2021-04-09 DIAGNOSIS — I5084 End stage heart failure: Secondary | ICD-10-CM | POA: Diagnosis not present

## 2021-04-09 DIAGNOSIS — I509 Heart failure, unspecified: Secondary | ICD-10-CM | POA: Diagnosis not present

## 2021-04-09 DIAGNOSIS — I38 Endocarditis, valve unspecified: Secondary | ICD-10-CM

## 2021-04-09 DIAGNOSIS — I10 Essential (primary) hypertension: Secondary | ICD-10-CM | POA: Diagnosis not present

## 2021-04-09 DIAGNOSIS — I4892 Unspecified atrial flutter: Secondary | ICD-10-CM | POA: Diagnosis not present

## 2021-04-09 DIAGNOSIS — I4891 Unspecified atrial fibrillation: Secondary | ICD-10-CM | POA: Diagnosis not present

## 2021-04-09 DIAGNOSIS — I088 Other rheumatic multiple valve diseases: Secondary | ICD-10-CM | POA: Diagnosis not present

## 2021-04-09 DIAGNOSIS — I5023 Acute on chronic systolic (congestive) heart failure: Secondary | ICD-10-CM | POA: Diagnosis not present

## 2021-04-09 DIAGNOSIS — E119 Type 2 diabetes mellitus without complications: Secondary | ICD-10-CM | POA: Diagnosis not present

## 2021-04-09 DIAGNOSIS — I340 Nonrheumatic mitral (valve) insufficiency: Secondary | ICD-10-CM

## 2021-04-09 DIAGNOSIS — Z515 Encounter for palliative care: Secondary | ICD-10-CM | POA: Diagnosis not present

## 2021-04-09 DIAGNOSIS — E1169 Type 2 diabetes mellitus with other specified complication: Secondary | ICD-10-CM | POA: Diagnosis not present

## 2021-04-09 DIAGNOSIS — E669 Obesity, unspecified: Secondary | ICD-10-CM | POA: Diagnosis not present

## 2021-04-09 HISTORY — PX: TEE WITHOUT CARDIOVERSION: SHX5443

## 2021-04-09 LAB — GLUCOSE, CAPILLARY
Glucose-Capillary: 169 mg/dL — ABNORMAL HIGH (ref 70–99)
Glucose-Capillary: 171 mg/dL — ABNORMAL HIGH (ref 70–99)
Glucose-Capillary: 190 mg/dL — ABNORMAL HIGH (ref 70–99)
Glucose-Capillary: 176 mg/dL — ABNORMAL HIGH (ref 70–99)
Glucose-Capillary: 179 mg/dL — ABNORMAL HIGH (ref 70–99)

## 2021-04-09 LAB — BASIC METABOLIC PANEL
Anion gap: 10 (ref 5–15)
BUN: 22 mg/dL — ABNORMAL HIGH (ref 6–20)
CO2: 31 mmol/L (ref 22–32)
Calcium: 9 mg/dL (ref 8.9–10.3)
Chloride: 91 mmol/L — ABNORMAL LOW (ref 98–111)
Creatinine, Ser: 1.1 mg/dL (ref 0.61–1.24)
GFR, Estimated: >60 mL/min (ref >60)
Glucose, Bld: 165 mg/dL — ABNORMAL HIGH (ref 70–99)
Potassium: 3.5 mmol/L (ref 3.5–5.1)
Sodium: 132 mmol/L — ABNORMAL LOW (ref 135–145)

## 2021-04-09 LAB — CBC
HCT: 30.2 % — ABNORMAL LOW (ref 39.0–52.0)
Hemoglobin: 10.1 g/dL — ABNORMAL LOW (ref 13.0–17.0)
MCH: 30.2 pg (ref 26.0–34.0)
MCHC: 33.4 g/dL (ref 30.0–36.0)
MCV: 90.4 fL (ref 80.0–100.0)
Platelets: 296 10*3/uL (ref 150–400)
RBC: 3.34 MIL/uL — ABNORMAL LOW (ref 4.22–5.81)
RDW: 13.6 % (ref 11.5–15.5)
WBC: 8.9 10*3/uL (ref 4.0–10.5)
nRBC: 0 % (ref 0.0–0.2)

## 2021-04-09 LAB — MAGNESIUM: Magnesium: 2 mg/dL (ref 1.7–2.4)

## 2021-04-09 SURGERY — ECHOCARDIOGRAM, TRANSESOPHAGEAL
Anesthesia: Monitor Anesthesia Care

## 2021-04-09 MED ORDER — SODIUM CHLORIDE 0.9% FLUSH
10.0000 mL | Freq: Two times a day (BID) | INTRAVENOUS | Status: DC
  Administered 2021-04-09 – 2021-04-15 (×11): 10 mL

## 2021-04-09 MED ORDER — MIDAZOLAM HCL 2 MG/2ML IJ SOLN
INTRAMUSCULAR | Status: DC | PRN
  Administered 2021-04-09: 2 mg via INTRAVENOUS

## 2021-04-09 MED ORDER — SODIUM CHLORIDE 0.9% FLUSH
10.0000 mL | INTRAVENOUS | Status: DC | PRN
  Administered 2021-04-10 – 2021-04-15 (×2): 10 mL

## 2021-04-09 MED ORDER — CHLORHEXIDINE GLUCONATE CLOTH 2 % EX PADS
6.0000 | MEDICATED_PAD | Freq: Every day | CUTANEOUS | Status: DC
  Administered 2021-04-09 – 2021-04-15 (×7): 6 via TOPICAL

## 2021-04-09 MED ORDER — MIDAZOLAM HCL 2 MG/2ML IJ SOLN
INTRAMUSCULAR | Status: AC
  Filled 2021-04-09: qty 2

## 2021-04-09 MED ORDER — SODIUM CHLORIDE 0.9% FLUSH
3.0000 mL | Freq: Two times a day (BID) | INTRAVENOUS | Status: DC
  Administered 2021-04-09: 3 mL via INTRAVENOUS

## 2021-04-09 MED ORDER — RAMELTEON 8 MG PO TABS
8.0000 mg | ORAL_TABLET | Freq: Every day | ORAL | Status: DC
  Administered 2021-04-09 – 2021-04-14 (×6): 8 mg via ORAL
  Filled 2021-04-09 (×7): qty 1

## 2021-04-09 MED ORDER — ASPIRIN 81 MG PO CHEW
81.0000 mg | CHEWABLE_TABLET | ORAL | Status: AC
  Administered 2021-04-10: 81 mg via ORAL
  Filled 2021-04-09: qty 1

## 2021-04-09 MED ORDER — SODIUM CHLORIDE 0.9 % IV SOLN: 250.0000 mL | INTRAVENOUS | Status: DC | PRN

## 2021-04-09 MED ORDER — SODIUM CHLORIDE 0.9 % IV SOLN: INTRAVENOUS | Status: DC

## 2021-04-09 MED ORDER — TRAMADOL HCL 50 MG PO TABS
50.0000 mg | ORAL_TABLET | Freq: Four times a day (QID) | ORAL | Status: DC | PRN
  Administered 2021-04-09 – 2021-04-15 (×7): 50 mg via ORAL
  Filled 2021-04-09 (×8): qty 1

## 2021-04-09 MED ORDER — GUAIFENESIN-DM 100-10 MG/5ML PO SYRP
5.0000 mL | ORAL_SOLUTION | ORAL | Status: DC | PRN
  Administered 2021-04-09 – 2021-04-13 (×7): 5 mL via ORAL
  Filled 2021-04-09 (×7): qty 5

## 2021-04-09 MED ORDER — POTASSIUM CHLORIDE CRYS ER 20 MEQ PO TBCR
40.0000 meq | EXTENDED_RELEASE_TABLET | ORAL | Status: AC
  Administered 2021-04-09 (×2): 40 meq via ORAL
  Filled 2021-04-09 (×2): qty 2

## 2021-04-09 MED ORDER — LINEZOLID 600 MG PO TABS
600.0000 mg | ORAL_TABLET | Freq: Two times a day (BID) | ORAL | Status: DC
  Administered 2021-04-09 – 2021-04-15 (×13): 600 mg via ORAL
  Filled 2021-04-09 (×14): qty 1

## 2021-04-09 MED ORDER — SODIUM CHLORIDE 0.9% FLUSH: 3.0000 mL | INTRAVENOUS | Status: DC | PRN

## 2021-04-09 MED ORDER — LIDOCAINE 2% (20 MG/ML) 5 ML SYRINGE
INTRAMUSCULAR | Status: DC | PRN
Start: 2021-04-09 — End: 2021-04-09
  Administered 2021-04-09: 100 mg via INTRAVENOUS

## 2021-04-09 MED ORDER — PROPOFOL 500 MG/50ML IV EMUL
INTRAVENOUS | Status: DC | PRN
  Administered 2021-04-09: 100 ug/kg/min via INTRAVENOUS

## 2021-04-09 NOTE — Transfer of Care (Signed)
Immediate Anesthesia Transfer of Care Note  Patient: Jonathan [name]  Procedure(s) Performed: TRANSESOPHAGEAL ECHOCARDIOGRAM (TEE)  Patient Location: PACU  Anesthesia Type:MAC  Level of Consciousness: drowsy  Airway & Oxygen Therapy: Patient Spontanous Breathing  Post-op Assessment: Report given to RN and Post -op Vital signs reviewed and stable  Post vital signs: Reviewed and stable  Last Vitals:  Vitals Value Taken Time  BP 100/79 04/09/21 0844  Temp    Pulse 105 04/09/21 0845  Resp 26 04/09/21 0845  SpO2 94 % 04/09/21 0845  Vitals shown include unvalidated device data.  Last Pain:  Vitals:   04/09/21 0737  TempSrc: Temporal  PainSc: 0-No pain      Patients Stated Pain Goal: 2 (26/20/35 5974)  Complications: No notable events documented.

## 2021-04-09 NOTE — Interval H&P Note (Signed)
History and Physical Interval Note:  04/09/2021 8:39 AM  Jonathan [name]  has presented today for surgery, with the diagnosis of r/o endocarditis.  The various methods of treatment have been discussed with the patient and family. After consideration of risks, benefits and other options for treatment, the patient has consented to  Procedure(s): TRANSESOPHAGEAL ECHOCARDIOGRAM (TEE) (N/A) as a surgical intervention.  The patient's history has been reviewed, patient examined, no change in status, stable for surgery.  I have reviewed the patient's chart and labs.  Questions were answered to the patient's satisfaction.     Cicley Ganesh

## 2021-04-09 NOTE — Progress Notes (Signed)
Mobility Specialist Progress Note:   04/09/21 1500  Mobility  Activity Ambulated with assistance in hallway  Level of Assistance Standby assist, set-up cues, supervision of patient - no hands on  Assistive Device None  Distance Ambulated (ft) 350 ft  Activity Response Tolerated well  $Mobility charge 1 Mobility   Pt with R arm pain from PICC line this afternoon, otherwise asx. Back sitting EOB with all needs met.   Nelta Numbers Acute Rehab Phone: [phone] Office Phone: [phone]

## 2021-04-09 NOTE — Progress Notes (Signed)
HD#4 SUBJECTIVE:  Patient Summary: Jonathan [name] is a 36 yo male with nonischemic cardiomyopathy, end-stage heart failure on a milrinone drip at home, type 2 diabetes, and obesity who presents to [facility] with dyspnea on exertion and difficulty moving over the last few days.   Overnight Events: TEE    Interm History: patient seen and evaluated. Had TEE this morning and feeling okay after. No complaints. No SOB, dizziness. LEE improving.   OBJECTIVE:  Vital Signs: Vitals:   04/09/21 0900 04/09/21 0917 04/09/21 1029 04/09/21 1420  BP: 101/76 (!) 95/59 114/71 103/79  Pulse: (!) 163 (!) 108 100   Resp: 20 20 18    Temp: 98.1 F (36.7 C) 98.3 F (36.8 C) 98.3 F (36.8 C)   TempSrc:  Oral Oral   SpO2: 96% 96% 95%   Weight:      Height:       SpO2: 95 % O2 Flow Rate (L/min): 2 L/min  Filed Weights   04/07/21 0400 04/08/21 0412 04/09/21 0147  Weight: (!) 161.2 kg (!) 159.9 kg (!) 159.7 kg     Intake/Output Summary (Last 24 hours) at 04/09/2021 1717 Last data filed at 04/09/2021 1521 Gross per 24 hour  Intake 3696.29 ml  Output 3000 ml  Net 696.29 ml   Net IO Since Admission: -17,680.89 mL [04/09/21 1717]  Physical Exam:  General: alert and oriented, no acute distress HEENT: PEERL Neck: supple Cardiac: tachycardic, regular rhythm, no MRG Lungs: CTAB, no wheeze, rales, or rhnonchi, on supplemental O2 [place] Abd: soft, NT, not distended, + BS Extremities: 1+ edema to upper shin , wraps in place. Neuro: alert and oriented, moves all extremities Psych: mood and affect appropriate.   Patient Lines/Drains/Airways Status     Active Line/Drains/Airways     Name Placement date Placement time Site Days   PICC Double Lumen 10/28/20 PICC Right Cephalic 39 cm 0 cm [id number]  1851  -- 159            Pertinent Labs: CBC Latest Ref Rng & Units 04/09/2021 04/08/2021 04/07/2021  WBC 4.0 - 10.5 K/uL 8.9 8.5 8.1  Hemoglobin 13.0 - 17.0 g/dL 10.1(L) 10.7(L) 10.7(L)  Hematocrit  39.0 - 52.0 % 30.2(L) 31.9(L) 31.3(L)  Platelets 150 - 400 K/uL 296 312 289    CMP Latest Ref Rng & Units 04/09/2021 04/08/2021 04/08/2021  Glucose 70 - 99 mg/dL 165(H) - 199(H)  BUN 6 - 20 mg/dL 22(H) - 24(H)  Creatinine 0.61 - 1.24 mg/dL 1.10 - 1.06  Sodium 135 - 145 mmol/L 132(L) - 129(L)  Potassium 3.5 - 5.1 mmol/L 3.5 3.7 3.1(L)  Chloride 98 - 111 mmol/L 91(L) - 88(L)  CO2 22 - 32 mmol/L 31 - 30  Calcium 8.9 - 10.3 mg/dL 9.0 - 8.8(L)  Total Protein 6.5 - 8.1 g/dL - - 6.8  Total Bilirubin 0.3 - 1.2 mg/dL - - 0.3  Alkaline Phos 38 - 126 U/L - - 87  AST 15 - 41 U/L - - 21  ALT 0 - 44 U/L - - 22    Recent Labs    04/09/21 0846 04/09/21 1124 04/09/21 1606  GLUCAP 171* 190* 176*     Pertinent Imaging: [place] EKG SITE RITE  Result Date: 04/09/2021 If [facility] image not attached, placement could not be confirmed due to current cardiac rhythm.   ASSESSMENT/PLAN:  Assessment: Active Problems:   Diabetes mellitus type 2 in obese [facility])   Essential hypertension   Acute exacerbation of CHF (congestive  heart failure) (HCC)   Hyponatremia with excess extracellular fluid volume   Positive blood culture  # acute on chronic end stage heart failure with reduced EF Noted to be approximately 15 lbs over dry weight. He has had good urine output approximately 5L yesterday. He appears to be making improvements daily. He was able to ambulate down the hallway with PT this morning without any supplemental O2.  - Heart failure is following. PICC line has been placed today. Patient is receiving milrinone, Digoxin 0.125mg , Bidil TID, lasix 80 mg Q4hr Spiro 25mg  daily. - AKI has resolved and he is on entresto 24/26 BID - Unable to start patient on SGLT2 due to candidasis. BB contraindicated due to low output.  - Patient is nearing euvolemia but will be continued on IV diuresis today. HF is planning for R/L heart cath tomorrow to decide if patient is VAD candidate.  - will continue I+Os, daily  weights  Possible Bacteremia  +Blood Cultures Blood cultures positive for E faecalis and staph epidermidis in one set of blood cultures drawn through is PICC line on admission. Blood cultures collected 2/19 negative 4 days and Bcx from 2/20 no growth 3 days. He underwent TEE which showed no concern for endocarditis.  - Ampicillin and ceftriaxone have been discontinued. He is now on oral linezolid. ID following.   # Hx atrial flutter - Patient is currently in normal sinus. He is on 100mg  amio daily as well as Eliquis.   # AKI - suspect cardiorenal as his serum creatinine has shown improvements with continued diuresis. Cannot exclude low output HF however as he has been on increased milrinone. Will need to continue to monitor closely with daily BMPs. Renal function appears stable at this time.   Hyperkalemia - 3.1 this AM due to aggressive diuresis. Repleted. Will need to continue to monitor,.   Type 2 diabetes On sliding scale with meals and bedtime. Glucose has been running high in 200's despite [place]. Okay to take Trulicity. Continue CBG checks.   Signature: Delene Ruffini, MD  Internal Medicine Resident, PGY-1 [facility]  Pager: [phone] 5:17 PM, 04/09/2021   Please contact the on call pager after 5 pm and on weekends at [phone].

## 2021-04-09 NOTE — TOC Benefit Eligibility Note (Signed)
Patient Advocate Encounter ° °Insurance verification completed.   ° °The patient is currently admitted and upon discharge could be taking linezolid (Zyvox) 600 mg tablets. ° °The current 30 day co-pay is, $4.00.  ° °The patient is insured through [employer] [place] [employer]  ° ° ° °[name], CPhT °Pharmacy Patient Advocate Specialist °[facility] Patient Advocate Team °Direct Number: [phone]  Fax: [phone] ° ° ° ° ° °  °

## 2021-04-09 NOTE — Progress Notes (Signed)
Advanced Heart Failure Rounding Note  PCP-Cardiologist: Fransico Him, MD   Subjective:    Continues on milrinone 0.5. PICC out, no co-ox.  Diuresing well on IV lasix. Out 5L.   Breathing better. No CP or SOB.   TEE: EF 20% Moderate to severe RV dysfunction. No endocarditis   Objective:   Weight Range: (!) 159.7 kg Body mass index is 45.21 kg/m.   Vital Signs:   Temp:  [97.6 F (36.4 C)-98.8 F (37.1 C)] 97.6 F (36.4 C) (02/23 0737) Pulse Rate:  [102-111] 111 (02/23 0737) Resp:  [16-21] 21 (02/23 0737) BP: (97-132)/(55-95) 126/95 (02/23 0737) SpO2:  [93 %-98 %] 98 % (02/23 0737) Weight:  [159.7 kg] 159.7 kg (02/23 0147) Last BM Date : 04/07/21  Weight change: Filed Weights   04/07/21 0400 04/08/21 0412 04/09/21 0147  Weight: (!) 161.2 kg (!) 159.9 kg (!) 159.7 kg    Intake/Output:   Intake/Output Summary (Last 24 hours) at 04/09/2021 0834 Last data filed at 04/09/2021 [id number] Gross per 24 hour  Intake 3306.19 ml  Output 4600 ml  Net -1293.81 ml       Physical Exam    General:  Lying in bed No resp difficulty HEENT: normal Neck: supple. Har to see JVP  Carotids 2+ bilat; no bruits. No lymphadenopathy or thryomegaly appreciated. Cor: Regular rate & rhythm. 2/6 TR. Lungs: clear Abdomen: soft, nontender, nondistended. No hepatosplenomegaly. No bruits or masses. Good bowel sounds. Extremities: no cyanosis, clubbing, rash, wrapped 1+ edema Neuro: alert & orientedx3, cranial nerves grossly intact. moves all 4 extremities w/o difficulty. Affect pleasan   Telemetry   Sinus ~ 100 +PVCs Personally reviewed   Labs    CBC Recent Labs    04/08/21 0422 04/09/21 0432  WBC 8.5 8.9  HGB 10.7* 10.1*  HCT 31.9* 30.2*  MCV 90.6 90.4  PLT 312 0000000    Basic Metabolic Panel Recent Labs    04/08/21 0422 04/08/21 1409 04/09/21 0432  NA 129*  --  132*  K 3.1* 3.7 3.5  CL 88*  --  91*  CO2 30  --  31  GLUCOSE 199*  --  165*  BUN 24*  --  22*   CREATININE 1.06  --  1.10  CALCIUM 8.8*  --  9.0  MG 1.7  --  2.0    Liver Function Tests Recent Labs    04/08/21 0422  AST 21  ALT 22  ALKPHOS 87  BILITOT 0.3  PROT 6.8  ALBUMIN 2.6*    No results for input(s): LIPASE, AMYLASE in the last 72 hours. Cardiac Enzymes No results for input(s): CKTOTAL, CKMB, CKMBINDEX, TROPONINI in the last 72 hours.  BNP: BNP (last 3 results) Recent Labs    10/28/20 1315 03/12/21 1505 04/05/21 0148  BNP 1,547.7* 448.3* 460.5*     ProBNP (last 3 results) No results for input(s): PROBNP in the last 8760 hours.   D-Dimer No results for input(s): DDIMER in the last 72 hours. Hemoglobin A1C No results for input(s): HGBA1C in the last 72 hours.  Fasting Lipid Panel No results for input(s): CHOL, HDL, LDLCALC, TRIG, CHOLHDL, LDLDIRECT in the last 72 hours. Thyroid Function Tests No results for input(s): TSH, T4TOTAL, T3FREE, THYROIDAB in the last 72 hours.  Invalid input(s): FREET3  Other results:   Imaging    No results found.   Medications:     Scheduled Medications:  [MAR Hold] amiodarone  100 mg Oral Daily   [MAR Hold] apixaban  5 mg Oral BID   [MAR Hold] digoxin  125 mcg Oral Daily   [MAR Hold] Dulaglutide  1.5 mg Subcutaneous Q Wed   [MAR Hold] furosemide  80 mg Intravenous Q4H   [MAR Hold] insulin aspart  0-15 Units Subcutaneous TID WC   [MAR Hold] insulin aspart  0-5 Units Subcutaneous QHS   [MAR Hold] insulin aspart  8 Units Subcutaneous TID WC   [MAR Hold] insulin glargine-yfgn  25 Units Subcutaneous QHS   [MAR Hold] isosorbide-hydrALAZINE  1 tablet Oral TID   [MAR Hold] ramelteon  8 mg Oral QHS   [MAR Hold] sacubitril-valsartan  1 tablet Oral BID   [MAR Hold] sodium chloride flush  3 mL Intravenous Q12H   [MAR Hold] spironolactone  25 mg Oral Daily    Infusions:  sodium chloride     [MAR Hold] sodium chloride 10 mL/hr at 04/06/21 2130   [MAR Hold] ampicillin (OMNIPEN) IV 2 g (04/09/21 0434)   [MAR  Hold] cefTRIAXone (ROCEPHIN)  IV 2 g (04/08/21 2055)   milrinone 0.5 mcg/kg/min (04/09/21 0701)    PRN Medications: [MAR Hold] sodium chloride, [MAR Hold] acetaminophen **OR** [MAR Hold] acetaminophen, [MAR Hold] guaiFENesin-dextromethorphan, [MAR Hold] senna-docusate, [MAR Hold] traMADol    Patient Profile     Jonathan [name] is a 36 y.o.with a history of chronic HFrEF, NICM, HTN, asthma, OSA, morbid obesity, and uncontrolled DM.   On home milrinone.  Assessment/Plan   1.  End Stage HFrEF: Nonischemic cardiomyopathy. Echo back in 2021 EF 30-35 % with moderate RV dysfunction in the setting of recurrent RBBB with significant dyssynchrony.  Echo 10/2020 EF < 20% and moderate RV dysfunction. He has been on home milrinone 0.375 with [facility] following since 9/22.  Not thought to be candidate for advanced therapies with obesity and noncompliance. Echo in 1/23 reviewed, shows EF < 20%, mild LV dilation, severe RV dysfunction with moderate RV enlargement.  He has developed NYHA class IV symptoms with cardiogenic shock, co-ox 52% on admit with elevated lactate.  - No central access. Does not want central line. PICC line out. See below. - On home milrinone 0.375. Increased milrinone to 0.5 mcg/kg/min this admit. -- Volume status improving but remains somewhat elevated. Continue IV diuresis one more day - Continue Entresto 24/26 mg BID - Continue spiro to 25 mg daily. - Continue digoxin 0.125. Dig level 0.4 - Intolerant SGLT2i due to candidiasis - Continue Bidil 1 tablet tid, BP stable. - No beta blocker d/t low output - Not candidate for transplant currently due to size (BMI 44). HgbA1c improved from > 14 to 8.8%.  Could consider LVAD, but RV function marginal He has been with [facility]. Continue diuresis and plan for [facility] tomorrow. No endocarditis on TEE. If felt to be potential candidate for VAD, would need to complete IV abx and have negative f/u BC. 2. DM2: Hgb A1c 8.8%.  - No  SGLT2i d/t candidiasis 3. AKI on CKD stage 3: Creatinine 2.14 initially, improved to 1.1 with diuresis and inotrope support - Follow closely.  4. Paroxysmal Atrial Flutter: Sinus tach today. - Continue amio to 100 mg daily.  - Hold Eliqus for cath tomorrow  5. OSA: Severe OSA AHI 65 - Does not wear CPAP. 6. Obesity: Body mass index is 45 7. ID - Bld CX. Enterococcus faecalis and Staph epidermidis bacteremia. ID following. Possible PICC colonization. On ceftriaxone + ampicllin. - BC 02/19 and 02/20 with NGTD - TEE 2/23 negative for endocarditis 8. PVCs - ~10/min  on telemetry - Keep K > 4.0 Mg > 2.0 - On amiodarone 100 mg daily. For AFL.   Length of Stay: [place], MD  04/09/2021, 8:34 AM  Advanced Heart Failure Team Pager [phone] (M-F; 7a - 5p)  Please contact [facility] for night-coverage after hours (5p -7a ) and weekends on [email or web address]  Patient seen and examined with the above-signed Advanced Practice Provider and/or Housestaff. I personally reviewed laboratory data, imaging studies and relevant notes. I independently examined the patient and formulated the important aspects of the plan. I have edited the note to reflect any of my changes or salient points. I have personally discussed the plan with the patient and/or family.  Remains on milrinone 0.5. Diuresing well. Scr improved. No fevers or chills. F/u cultures negative.   General:  Sitting on side of bed No resp difficulty HEENT: normal Neck: supple. JVP to jaw Carotids 2+ bilat; no bruits. No lymphadenopathy or thryomegaly appreciated. Cor: Regular rate & rhythm. No rubs, gallops or murmurs. Lungs: clear Abdomen: obese soft, nontender, nondistended. No hepatosplenomegaly. No bruits or masses. Good bowel sounds. Extremities: no cyanosis, clubbing, rash, 1-2+ edema + coban wraps Neuro: alert & orientedx3, cranial nerves grossly intact. moves all 4 extremities w/o difficulty. Affect pleasant  Improved on  milrinone 0.5. Remains on IV abx. Plan TEE tomorrow to look for endocarditis. Plan R/L cath on Friday to help decide if he may be a VAD candidate.   Glori Bickers, MD  8:34 AM

## 2021-04-09 NOTE — H&P (View-Only) (Signed)
Advanced Heart Failure Rounding Note  PCP-Cardiologist: Fransico Him, MD   Subjective:    Continues on milrinone 0.5. PICC out, no co-ox.  Diuresing well on IV lasix. Out 5L.   Breathing better. No CP or SOB.   TEE: EF 20% Moderate to severe RV dysfunction. No endocarditis   Objective:   Weight Range: (!) 159.7 kg Body mass index is 45.21 kg/m.   Vital Signs:   Temp:  [97.6 F (36.4 C)-98.8 F (37.1 C)] 97.6 F (36.4 C) (02/23 0737) Pulse Rate:  [102-111] 111 (02/23 0737) Resp:  [16-21] 21 (02/23 0737) BP: (97-132)/(55-95) 126/95 (02/23 0737) SpO2:  [93 %-98 %] 98 % (02/23 0737) Weight:  [159.7 kg] 159.7 kg (02/23 0147) Last BM Date : 04/07/21  Weight change: Filed Weights   04/07/21 0400 04/08/21 0412 04/09/21 0147  Weight: (!) 161.2 kg (!) 159.9 kg (!) 159.7 kg    Intake/Output:   Intake/Output Summary (Last 24 hours) at 04/09/2021 0834 Last data filed at 04/09/2021 [id number] Gross per 24 hour  Intake 3306.19 ml  Output 4600 ml  Net -1293.81 ml       Physical Exam    General:  Lying in bed No resp difficulty HEENT: normal Neck: supple. Har to see JVP  Carotids 2+ bilat; no bruits. No lymphadenopathy or thryomegaly appreciated. Cor: Regular rate & rhythm. 2/6 TR. Lungs: clear Abdomen: soft, nontender, nondistended. No hepatosplenomegaly. No bruits or masses. Good bowel sounds. Extremities: no cyanosis, clubbing, rash, wrapped 1+ edema Neuro: alert & orientedx3, cranial nerves grossly intact. moves all 4 extremities w/o difficulty. Affect pleasan   Telemetry   Sinus ~ 100 +PVCs Personally reviewed   Labs    CBC Recent Labs    04/08/21 0422 04/09/21 0432  WBC 8.5 8.9  HGB 10.7* 10.1*  HCT 31.9* 30.2*  MCV 90.6 90.4  PLT 312 0000000    Basic Metabolic Panel Recent Labs    04/08/21 0422 04/08/21 1409 04/09/21 0432  NA 129*  --  132*  K 3.1* 3.7 3.5  CL 88*  --  91*  CO2 30  --  31  GLUCOSE 199*  --  165*  BUN 24*  --  22*   CREATININE 1.06  --  1.10  CALCIUM 8.8*  --  9.0  MG 1.7  --  2.0    Liver Function Tests Recent Labs    04/08/21 0422  AST 21  ALT 22  ALKPHOS 87  BILITOT 0.3  PROT 6.8  ALBUMIN 2.6*    No results for input(s): LIPASE, AMYLASE in the last 72 hours. Cardiac Enzymes No results for input(s): CKTOTAL, CKMB, CKMBINDEX, TROPONINI in the last 72 hours.  BNP: BNP (last 3 results) Recent Labs    10/28/20 1315 03/12/21 1505 04/05/21 0148  BNP 1,547.7* 448.3* 460.5*     ProBNP (last 3 results) No results for input(s): PROBNP in the last 8760 hours.   D-Dimer No results for input(s): DDIMER in the last 72 hours. Hemoglobin A1C No results for input(s): HGBA1C in the last 72 hours.  Fasting Lipid Panel No results for input(s): CHOL, HDL, LDLCALC, TRIG, CHOLHDL, LDLDIRECT in the last 72 hours. Thyroid Function Tests No results for input(s): TSH, T4TOTAL, T3FREE, THYROIDAB in the last 72 hours.  Invalid input(s): FREET3  Other results:   Imaging    No results found.   Medications:     Scheduled Medications:  [MAR Hold] amiodarone  100 mg Oral Daily   [MAR Hold] apixaban  5 mg Oral BID   [MAR Hold] digoxin  125 mcg Oral Daily   [MAR Hold] Dulaglutide  1.5 mg Subcutaneous Q Wed   [MAR Hold] furosemide  80 mg Intravenous Q4H   [MAR Hold] insulin aspart  0-15 Units Subcutaneous TID WC   [MAR Hold] insulin aspart  0-5 Units Subcutaneous QHS   [MAR Hold] insulin aspart  8 Units Subcutaneous TID WC   [MAR Hold] insulin glargine-yfgn  25 Units Subcutaneous QHS   [MAR Hold] isosorbide-hydrALAZINE  1 tablet Oral TID   [MAR Hold] ramelteon  8 mg Oral QHS   [MAR Hold] sacubitril-valsartan  1 tablet Oral BID   [MAR Hold] sodium chloride flush  3 mL Intravenous Q12H   [MAR Hold] spironolactone  25 mg Oral Daily    Infusions:  sodium chloride     [MAR Hold] sodium chloride 10 mL/hr at 04/06/21 2130   [MAR Hold] ampicillin (OMNIPEN) IV 2 g (04/09/21 0434)   [MAR  Hold] cefTRIAXone (ROCEPHIN)  IV 2 g (04/08/21 2055)   milrinone 0.5 mcg/kg/min (04/09/21 0701)    PRN Medications: [MAR Hold] sodium chloride, [MAR Hold] acetaminophen **OR** [MAR Hold] acetaminophen, [MAR Hold] guaiFENesin-dextromethorphan, [MAR Hold] senna-docusate, [MAR Hold] traMADol    Patient Profile     [name] is a 36 y.o.with a history of chronic HFrEF, NICM, HTN, asthma, OSA, morbid obesity, and uncontrolled DM.   On home milrinone.  Assessment/Plan   1.  End Stage HFrEF: Nonischemic cardiomyopathy. Echo back in 2021 EF 30-35 % with moderate RV dysfunction in the setting of recurrent RBBB with significant dyssynchrony.  Echo 10/2020 EF < 20% and moderate RV dysfunction. He has been on home milrinone 0.375 with [facility] following since 9/22.  Not thought to be candidate for advanced therapies with obesity and noncompliance. Echo in 1/23 reviewed, shows EF < 20%, mild LV dilation, severe RV dysfunction with moderate RV enlargement.  He has developed NYHA class IV symptoms with cardiogenic shock, co-ox 52% on admit with elevated lactate.  - No central access. Does not want central line. PICC line out. See below. - On home milrinone 0.375. Increased milrinone to 0.5 mcg/kg/min this admit. -- Volume status improving but remains somewhat elevated. Continue IV diuresis one more day - Continue Entresto 24/26 mg BID - Continue spiro to 25 mg daily. - Continue digoxin 0.125. Dig level 0.4 - Intolerant SGLT2i due to candidiasis - Continue Bidil 1 tablet tid, BP stable. - No beta blocker d/t low output - Not candidate for transplant currently due to size (BMI 44). HgbA1c improved from > 14 to 8.8%.  Could consider LVAD, but RV function marginal He has been with [facility]. Continue diuresis and plan for [facility] tomorrow. No endocarditis on TEE. If felt to be potential candidate for VAD, would need to complete IV abx and have negative f/u BC. 2. DM2: Hgb A1c 8.8%.  - No  SGLT2i d/t candidiasis 3. AKI on CKD stage 3: Creatinine 2.14 initially, improved to 1.1 with diuresis and inotrope support - Follow closely.  4. Paroxysmal Atrial Flutter: Sinus tach today. - Continue amio to 100 mg daily.  - Hold Eliqus for cath tomorrow  5. OSA: Severe OSA AHI 65 - Does not wear CPAP. 6. Obesity: Body mass index is 45 7. ID - Bld CX. Enterococcus faecalis and Staph epidermidis bacteremia. ID following. Possible PICC colonization. On ceftriaxone + ampicllin. - BC 02/19 and 02/20 with NGTD - TEE 2/23 negative for endocarditis 8. PVCs - ~10/min  on telemetry - Keep K > 4.0 Mg > 2.0 - On amiodarone 100 mg daily. For AFL.   Length of Stay: [place], MD  04/09/2021, 8:34 AM  Advanced Heart Failure Team Pager [phone] (M-F; 7a - 5p)  Please contact [facility] for night-coverage after hours (5p -7a ) and weekends on [email or web address]  Patient seen and examined with the above-signed Advanced Practice Provider and/or Housestaff. I personally reviewed laboratory data, imaging studies and relevant notes. I independently examined the patient and formulated the important aspects of the plan. I have edited the note to reflect any of my changes or salient points. I have personally discussed the plan with the patient and/or family.  Remains on milrinone 0.5. Diuresing well. Scr improved. No fevers or chills. F/u cultures negative.   General:  Sitting on side of bed No resp difficulty HEENT: normal Neck: supple. JVP to jaw Carotids 2+ bilat; no bruits. No lymphadenopathy or thryomegaly appreciated. Cor: Regular rate & rhythm. No rubs, gallops or murmurs. Lungs: clear Abdomen: obese soft, nontender, nondistended. No hepatosplenomegaly. No bruits or masses. Good bowel sounds. Extremities: no cyanosis, clubbing, rash, 1-2+ edema + coban wraps Neuro: alert & orientedx3, cranial nerves grossly intact. moves all 4 extremities w/o difficulty. Affect pleasant  Improved on  milrinone 0.5. Remains on IV abx. Plan TEE tomorrow to look for endocarditis. Plan R/L cath on Friday to help decide if he may be a VAD candidate.   Jonathan Bickers, MD  8:34 AM

## 2021-04-09 NOTE — CV Procedure (Signed)
° ° °  TRANSESOPHAGEAL ECHOCARDIOGRAM   NAME:  Jonathan [name]   MRN: [id number] DOB:  [birth year]   ADMIT DATE: 04/05/2021  INDICATIONS:  Bacteremia  PROCEDURE:   Informed consent was obtained prior to the procedure. The risks, benefits and alternatives for the procedure were discussed and the patient comprehended these risks.  Risks include, but are not limited to, cough, sore throat, vomiting, nausea, somnolence, esophageal and stomach trauma or perforation, bleeding, low blood pressure, aspiration, pneumonia, infection, trauma to the teeth and death.    After a procedural time-out, the patient was sedated by the anesthesia service. Once an appropriate level of sedation was achieved, the transesophageal probe was inserted in the esophagus and stomach without difficulty and multiple views were obtained.    COMPLICATIONS:    There were no immediate complications.  FINDINGS:  LEFT VENTRICLE: EF = 20%. No regional wall motion abnormalities.  RIGHT VENTRICLE: Moderate to severe HK   LEFT ATRIUM: Markedly dilated  LEFT ATRIAL APPENDAGE: No thrombus. + smoke  RIGHT ATRIUM: Markedly dilated  AORTIC VALVE:  Trileaflet.  No AI/AS No vegetation   MITRAL VALVE:    Mild MR  No vegetation  TRICUSPID VALVE:  Moderate to severe TR. No vegetation   PULMONIC VALVE: Grossly normal. Trivial PI No vegetation  INTERATRIAL SEPTUM: No PFO or ASD.  PERICARDIUM: No effusion  DESCENDING AORTA: Not well visualized    CONCLUSION:  No TEE evidence of endocarditis  Vernie Piet,MD 8:40 AM

## 2021-04-09 NOTE — Progress Notes (Signed)
Peripherally Inserted Central Catheter Placement  The IV Nurse has discussed with the patient and/or persons authorized to consent for the patient, the purpose of this procedure and the potential benefits and risks involved with this procedure.  The benefits include less needle sticks, lab draws from the catheter, and the patient may be discharged home with the catheter. Risks include, but not limited to, infection, bleeding, blood clot (thrombus formation), and puncture of an artery; nerve damage and irregular heartbeat and possibility to perform a PICC exchange if needed/ordered by physician.  Alternatives to this procedure were also discussed.  Bard Power PICC patient education guide, fact sheet on infection prevention and patient information card has been provided to patient /or left at bedside.    PICC Placement Documentation  PICC Double Lumen [id number] Right Basilic 43 cm 0 cm (Active)  Indication for Insertion or Continuance of Line Chronic illness with exacerbations (CF, Sickle Cell, etc.) 04/09/21 1328  Exposed Catheter (cm) 0 cm 04/09/21 1328  Site Assessment Clean, Dry, Intact 04/09/21 1328  Lumen #1 Status Flushed;Blood return noted;Saline locked 04/09/21 1328  Lumen #2 Status Flushed;Blood return noted;Saline locked 04/09/21 1328  Dressing Type Securing device 04/09/21 1328  Dressing Status Antimicrobial disc in place 04/09/21 1328  Dressing Change Due 04/16/21 04/09/21 1328       Krithi Bray Ramos 04/09/2021, 1:30 PM

## 2021-04-09 NOTE — Progress Notes (Signed)
[place] for Infectious Disease  Date of Admission:  04/05/2021           Reason for visit: Follow up on bacteremia  Current antibiotics: Ceftriaxone Ampicillin  ASSESSMENT:    36 y.o. male admitted with:  Enterococcus faecalis and Staph epidermidis blood culture positivity: Difficult to interpret significance of cultures as they were drawn from his PICC line which was subsequently removed.  These may have represented colonization of his line versus true infection.  If colonization, his PICC line has already been removed.  TEE was performed in case of true bacteremia to evaluate for infective endocarditis which was negative today.  Repeat blood cultures following PICC line removal have been negative. Acute on chronic end-stage heart failure Type 2 diabetes AKI: Creatinine improved with diuresis and inotrope support  RECOMMENDATIONS:    Since endocarditis has been ruled out will stop ceftriaxone and ampicillin Overall, suspect that his line was colonized as an explanation for his positive blood cultures, however will err on the side of caution and treat with 2 weeks linezolid from date of negative blood cultures.  End date 04/20/2021 Can obtain surveillance cultures at end of therapy from peripheral draw Will sign off, please call as needed   Active Problems:   Diabetes mellitus type 2 in obese [facility])   Essential hypertension   Acute exacerbation of CHF (congestive heart failure) (HCC)   Hyponatremia with excess extracellular fluid volume   Positive blood culture    MEDICATIONS:    Scheduled Meds:  amiodarone  100 mg Oral Daily   digoxin  125 mcg Oral Daily   Dulaglutide  1.5 mg Subcutaneous Q Wed   furosemide  80 mg Intravenous Q4H   insulin aspart  0-15 Units Subcutaneous TID WC   insulin aspart  0-5 Units Subcutaneous QHS   insulin aspart  8 Units Subcutaneous TID WC   insulin glargine-yfgn  25 Units Subcutaneous QHS   isosorbide-hydrALAZINE  1 tablet Oral  TID   linezolid  600 mg Oral Q12H   potassium chloride  40 mEq Oral Q2H   ramelteon  8 mg Oral QHS   sacubitril-valsartan  1 tablet Oral BID   sodium chloride flush  3 mL Intravenous Q12H   spironolactone  25 mg Oral Daily   Continuous Infusions:  sodium chloride 10 mL/hr at 04/09/21 0814   milrinone 0.5 mcg/kg/min (04/09/21 1131)   PRN Meds:.sodium chloride, acetaminophen **OR** acetaminophen, guaiFENesin-dextromethorphan, senna-docusate, traMADol  SUBJECTIVE:   24 hour events:  TEE performed this morning without evidence of endocarditis Blood cultures no growth to date from 2/20 after PICC line removed 2/19  No new complaints, afebrile.  Relieved that his TEE was negative.  Review of Systems  All other systems reviewed and are negative.    OBJECTIVE:   Blood pressure 114/71, pulse 100, temperature 98.3 F (36.8 C), temperature source Oral, resp. rate 18, height 6\' 2"  (1.88 m), weight (!) 159.7 kg, SpO2 95 %. Body mass index is 45.21 kg/m.  Physical Exam Constitutional:      General: He is not in acute distress.    Appearance: Normal appearance.  HENT:     Head: Normocephalic and atraumatic.  Eyes:     Extraocular Movements: Extraocular movements intact.     Conjunctiva/sclera: Conjunctivae normal.  Pulmonary:     Effort: Pulmonary effort is normal. No respiratory distress.  Musculoskeletal:        General: Normal range of motion.     Cervical  back: Normal range of motion and neck supple.  Skin:    General: Skin is warm and dry.     Findings: No rash.  Neurological:     General: No focal deficit present.     Mental Status: He is alert and oriented to person, place, and time.  Psychiatric:        Mood and Affect: Mood normal.        Behavior: Behavior normal.     Lab Results: Lab Results  Component Value Date   WBC 8.9 04/09/2021   HGB 10.1 (L) 04/09/2021   HCT 30.2 (L) 04/09/2021   MCV 90.4 04/09/2021   PLT 296 04/09/2021    Lab Results  Component  Value Date   NA 132 (L) 04/09/2021   K 3.5 04/09/2021   CO2 31 04/09/2021   GLUCOSE 165 (H) 04/09/2021   BUN 22 (H) 04/09/2021   CREATININE 1.10 04/09/2021   CALCIUM 9.0 04/09/2021   GFRNONAA >60 04/09/2021   GFRAA >60 11/09/2019    Lab Results  Component Value Date   ALT 22 04/08/2021   AST 21 04/08/2021   ALKPHOS 87 04/08/2021   BILITOT 0.3 04/08/2021    No results found for: CRP  No results found for: ESRSEDRATE   I have reviewed the micro and lab results in Epic.  Imaging: [place] EKG SITE RITE  Result Date: 04/09/2021 If [facility] image not attached, placement could not be confirmed due to current cardiac rhythm.    Imaging independently reviewed in Epic.    Raynelle Highland for Infectious Disease [facility] [phone] pager 04/09/2021, 11:35 AM

## 2021-04-09 NOTE — Plan of Care (Signed)

## 2021-04-09 NOTE — Progress Notes (Signed)
°  Echocardiogram Echocardiogram Transesophageal has been performed.  Jonathan [name] 04/09/2021, 8:46 AM

## 2021-04-09 NOTE — Anesthesia Procedure Notes (Signed)
Procedure Name: MAC Date/Time: 04/09/2021 8:15 AM Performed by: Erick Colace, CRNA Pre-anesthesia Checklist: Patient identified, Emergency Drugs available, Suction available, Patient being monitored and Timeout performed Patient Re-evaluated:Patient Re-evaluated prior to induction Oxygen Delivery Method: Supernova nasal CPAP Preoxygenation: Pre-oxygenation with 100% oxygen Induction Type: IV induction

## 2021-04-09 NOTE — Progress Notes (Signed)
Occupational Therapy Discharge Patient Details Name: Jonathan [name] MRN: [id number] DOB: [birth year] Today's Date: 04/09/2021 Time:  -     Patient discharged from OT services secondary to goals met and no further OT needs identified.  Please see latest therapy progress note for current level of functioning and progress toward goals.    Progress and discharge plan discussed with patient and/or caregiver: Patient/Caregiver agrees with plan  Please consult if needed. Pt reported they do want HEP as going to start at [facility] when able and they have energy conservation handout at home.      [name] OTR/L  Acute Rehab Services  [phone] office number [phone] pager number  [name] 04/09/2021, 11:48 AM

## 2021-04-10 ENCOUNTER — Encounter (HOSPITAL_COMMUNITY): Payer: Self-pay | Admitting: Internal Medicine

## 2021-04-10 ENCOUNTER — Inpatient Hospital Stay (HOSPITAL_COMMUNITY): Payer: Medicaid Other

## 2021-04-10 ENCOUNTER — Encounter (HOSPITAL_COMMUNITY)
Admission: EM | Disposition: A | Discharge status: Hospice | Payer: Self-pay | Source: Home / Self Care | Attending: Internal Medicine

## 2021-04-10 DIAGNOSIS — R7881 Bacteremia: Secondary | ICD-10-CM | POA: Diagnosis not present

## 2021-04-10 DIAGNOSIS — I4892 Unspecified atrial flutter: Secondary | ICD-10-CM | POA: Diagnosis not present

## 2021-04-10 DIAGNOSIS — E669 Obesity, unspecified: Secondary | ICD-10-CM | POA: Diagnosis not present

## 2021-04-10 DIAGNOSIS — I5023 Acute on chronic systolic (congestive) heart failure: Secondary | ICD-10-CM | POA: Diagnosis not present

## 2021-04-10 DIAGNOSIS — E1169 Type 2 diabetes mellitus with other specified complication: Secondary | ICD-10-CM | POA: Diagnosis not present

## 2021-04-10 DIAGNOSIS — N179 Acute kidney failure, unspecified: Secondary | ICD-10-CM | POA: Diagnosis not present

## 2021-04-10 DIAGNOSIS — J9811 Atelectasis: Secondary | ICD-10-CM | POA: Diagnosis not present

## 2021-04-10 DIAGNOSIS — I517 Cardiomegaly: Secondary | ICD-10-CM | POA: Diagnosis not present

## 2021-04-10 DIAGNOSIS — I509 Heart failure, unspecified: Secondary | ICD-10-CM | POA: Diagnosis not present

## 2021-04-10 HISTORY — PX: RIGHT/LEFT HEART CATH AND CORONARY ANGIOGRAPHY: CATH118266

## 2021-04-10 LAB — GLUCOSE, CAPILLARY
Glucose-Capillary: 232 mg/dL — ABNORMAL HIGH (ref 70–99)
Glucose-Capillary: 182 mg/dL — ABNORMAL HIGH (ref 70–99)
Glucose-Capillary: 171 mg/dL — ABNORMAL HIGH (ref 70–99)
Glucose-Capillary: 156 mg/dL — ABNORMAL HIGH (ref 70–99)
Glucose-Capillary: 156 mg/dL — ABNORMAL HIGH (ref 70–99)
Glucose-Capillary: 147 mg/dL — ABNORMAL HIGH (ref 70–99)
Glucose-Capillary: 168 mg/dL — ABNORMAL HIGH (ref 70–99)

## 2021-04-10 LAB — BASIC METABOLIC PANEL
Anion gap: 10 (ref 5–15)
BUN: 26 mg/dL — ABNORMAL HIGH (ref 6–20)
CO2: 29 mmol/L (ref 22–32)
Calcium: 9 mg/dL (ref 8.9–10.3)
Chloride: 93 mmol/L — ABNORMAL LOW (ref 98–111)
Creatinine, Ser: 1.17 mg/dL (ref 0.61–1.24)
GFR, Estimated: >60 mL/min (ref >60)
Glucose, Bld: 205 mg/dL — ABNORMAL HIGH (ref 70–99)
Potassium: 3.9 mmol/L (ref 3.5–5.1)
Sodium: 132 mmol/L — ABNORMAL LOW (ref 135–145)

## 2021-04-10 LAB — CULTURE, BLOOD (ROUTINE X 2)
Culture: NO GROWTH
Special Requests: ADEQUATE

## 2021-04-10 LAB — COOXEMETRY PANEL
Carboxyhemoglobin: 1.5 % (ref 0.5–1.5)
Methemoglobin: 0.9 % (ref 0.0–1.5)
O2 Saturation: 55.5 %
Total hemoglobin: 10.3 g/dL — ABNORMAL LOW (ref 12.0–16.0)

## 2021-04-10 LAB — CBC
HCT: 30.3 % — ABNORMAL LOW (ref 39.0–52.0)
Hemoglobin: 9.8 g/dL — ABNORMAL LOW (ref 13.0–17.0)
MCH: 30 pg (ref 26.0–34.0)
MCHC: 32.3 g/dL (ref 30.0–36.0)
MCV: 92.7 fL (ref 80.0–100.0)
Platelets: 281 10*3/uL (ref 150–400)
RBC: 3.27 MIL/uL — ABNORMAL LOW (ref 4.22–5.81)
RDW: 13.6 % (ref 11.5–15.5)
WBC: 8.7 10*3/uL (ref 4.0–10.5)
nRBC: 0 % (ref 0.0–0.2)

## 2021-04-10 LAB — MAGNESIUM
Magnesium: 1.9 mg/dL (ref 1.7–2.4)
Magnesium: 1.9 mg/dL (ref 1.7–2.4)

## 2021-04-10 SURGERY — RIGHT/LEFT HEART CATH AND CORONARY ANGIOGRAPHY
Anesthesia: LOCAL

## 2021-04-10 MED ORDER — ONDANSETRON HCL 4 MG/2ML IJ SOLN: 4.0000 mg | Freq: Four times a day (QID) | INTRAMUSCULAR | Status: DC | PRN

## 2021-04-10 MED ORDER — VERAPAMIL HCL 2.5 MG/ML IV SOLN
INTRAVENOUS | Status: DC | PRN
  Administered 2021-04-10: 10 mL via INTRA_ARTERIAL

## 2021-04-10 MED ORDER — INSULIN GLARGINE-YFGN 100 UNIT/ML ~~LOC~~ SOLN
20.0000 [IU] | Freq: Two times a day (BID) | SUBCUTANEOUS | Status: DC
  Administered 2021-04-10 – 2021-04-15 (×11): 20 [IU] via SUBCUTANEOUS
  Filled 2021-04-10 (×12): qty 0.2

## 2021-04-10 MED ORDER — SODIUM CHLORIDE 0.9 % IV SOLN: 250.0000 mL | INTRAVENOUS | Status: DC | PRN

## 2021-04-10 MED ORDER — LIDOCAINE HCL (PF) 1 % IJ SOLN
INTRAMUSCULAR | Status: AC
  Filled 2021-04-10: qty 30

## 2021-04-10 MED ORDER — FENTANYL CITRATE (PF) 100 MCG/2ML IJ SOLN
INTRAMUSCULAR | Status: AC
  Filled 2021-04-10: qty 2

## 2021-04-10 MED ORDER — HEPARIN (PORCINE) IN NACL 1000-0.9 UT/500ML-% IV SOLN
INTRAVENOUS | Status: AC
  Filled 2021-04-10: qty 500

## 2021-04-10 MED ORDER — FENTANYL CITRATE (PF) 100 MCG/2ML IJ SOLN
INTRAMUSCULAR | Status: DC | PRN
  Administered 2021-04-10: 25 ug via INTRAVENOUS

## 2021-04-10 MED ORDER — MIDAZOLAM HCL 2 MG/2ML IJ SOLN
INTRAMUSCULAR | Status: DC | PRN
  Administered 2021-04-10 (×2): 1 mg via INTRAVENOUS

## 2021-04-10 MED ORDER — IOHEXOL 350 MG/ML SOLN
INTRAVENOUS | Status: DC | PRN
  Administered 2021-04-10: 85 mL

## 2021-04-10 MED ORDER — ACETAMINOPHEN 325 MG PO TABS
650.0000 mg | ORAL_TABLET | ORAL | Status: DC | PRN
  Administered 2021-04-10 – 2021-04-15 (×2): 650 mg via ORAL
  Filled 2021-04-10 (×2): qty 2

## 2021-04-10 MED ORDER — APIXABAN 5 MG PO TABS
5.0000 mg | ORAL_TABLET | Freq: Two times a day (BID) | ORAL | Status: DC
  Administered 2021-04-10 – 2021-04-15 (×11): 5 mg via ORAL
  Filled 2021-04-10 (×11): qty 1

## 2021-04-10 MED ORDER — LABETALOL HCL 5 MG/ML IV SOLN: 10.0000 mg | INTRAVENOUS | Status: AC | PRN

## 2021-04-10 MED ORDER — HEPARIN SODIUM (PORCINE) 1000 UNIT/ML IJ SOLN
INTRAMUSCULAR | Status: AC
  Filled 2021-04-10: qty 10

## 2021-04-10 MED ORDER — SODIUM CHLORIDE 0.9% FLUSH: 3.0000 mL | INTRAVENOUS | Status: DC | PRN

## 2021-04-10 MED ORDER — INSULIN ASPART 100 UNIT/ML IJ SOLN
10.0000 [IU] | Freq: Three times a day (TID) | INTRAMUSCULAR | Status: DC
  Administered 2021-04-10 – 2021-04-15 (×13): 10 [IU] via SUBCUTANEOUS

## 2021-04-10 MED ORDER — MIDAZOLAM HCL 2 MG/2ML IJ SOLN
INTRAMUSCULAR | Status: AC
  Filled 2021-04-10: qty 2

## 2021-04-10 MED ORDER — LIDOCAINE HCL (PF) 1 % IJ SOLN
INTRAMUSCULAR | Status: DC | PRN
  Administered 2021-04-10 (×2): 2 mL

## 2021-04-10 MED ORDER — METOLAZONE 5 MG PO TABS
5.0000 mg | ORAL_TABLET | Freq: Once | ORAL | Status: AC
  Administered 2021-04-10: 5 mg via ORAL
  Filled 2021-04-10: qty 1

## 2021-04-10 MED ORDER — HEPARIN SODIUM (PORCINE) 1000 UNIT/ML IJ SOLN
INTRAMUSCULAR | Status: DC | PRN
  Administered 2021-04-10: 7000 [IU] via INTRAVENOUS

## 2021-04-10 MED ORDER — HEPARIN (PORCINE) IN NACL 1000-0.9 UT/500ML-% IV SOLN
INTRAVENOUS | Status: DC | PRN
  Administered 2021-04-10 (×2): 500 mL

## 2021-04-10 MED ORDER — MAGNESIUM SULFATE IN D5W 1-5 GM/100ML-% IV SOLN
1.0000 g | Freq: Once | INTRAVENOUS | Status: AC
  Administered 2021-04-10: 1 g via INTRAVENOUS
  Filled 2021-04-10: qty 100

## 2021-04-10 MED ORDER — HYDRALAZINE HCL 20 MG/ML IJ SOLN: 10.0000 mg | INTRAMUSCULAR | Status: AC | PRN

## 2021-04-10 MED ORDER — SODIUM CHLORIDE 0.9% FLUSH
3.0000 mL | Freq: Two times a day (BID) | INTRAVENOUS | Status: DC
  Administered 2021-04-10 (×2): 3 mL via INTRAVENOUS

## 2021-04-10 MED ORDER — POTASSIUM CHLORIDE 20 MEQ PO PACK
40.0000 meq | PACK | Freq: Two times a day (BID) | ORAL | Status: DC
  Administered 2021-04-10 – 2021-04-12 (×5): 40 meq via ORAL
  Filled 2021-04-10 (×5): qty 2

## 2021-04-10 MED ORDER — ADULT MULTIVITAMIN W/MINERALS CH
1.0000 | ORAL_TABLET | Freq: Every day | ORAL | Status: DC
  Administered 2021-04-10 – 2021-04-15 (×6): 1 via ORAL
  Filled 2021-04-10 (×6): qty 1

## 2021-04-10 MED ORDER — VERAPAMIL HCL 2.5 MG/ML IV SOLN
INTRAVENOUS | Status: AC
  Filled 2021-04-10: qty 2

## 2021-04-10 SURGICAL SUPPLY — 15 items
CATH 5FR JL3.5 JR4 ANG PIG MP (CATHETERS) ×1 IMPLANT
CATH BALLN WEDGE 5F 110CM (CATHETERS) ×1 IMPLANT
CATH INFINITI 5 FR MPA2 (CATHETERS) ×1 IMPLANT
CATH INFINITI 5FR AL1 (CATHETERS) ×1 IMPLANT
CATH INFINITI 5FR JK (CATHETERS) ×1 IMPLANT
CATH LAUNCHER 6FR JL3 (CATHETERS) ×1 IMPLANT
DEVICE RAD COMP TR BAND LRG (VASCULAR PRODUCTS) ×1 IMPLANT
GLIDESHEATH SLEND SS 6F .021 (SHEATH) ×1 IMPLANT
GUIDEWIRE INQWIRE 1.5J.035X260 (WIRE) IMPLANT
INQWIRE 1.5J .035X260CM (WIRE) ×2
KIT MICROPUNCTURE NIT STIFF (SHEATH) ×1 IMPLANT
PACK CARDIAC CATHETERIZATION (CUSTOM PROCEDURE TRAY) ×2 IMPLANT
SHEATH GLIDE SLENDER 4/5FR (SHEATH) ×1 IMPLANT
SHEATH PROBE COVER 6X72 (BAG) ×1 IMPLANT
TRANSDUCER W/STOPCOCK (MISCELLANEOUS) ×2 IMPLANT

## 2021-04-10 NOTE — Progress Notes (Addendum)
Advanced Heart Failure Rounding Note  PCP-Cardiologist: Armanda Magic, MD  AHF: Dr. Gala Romney   Subjective:    Continues on milrinone 0.5. Co-ox marginal but stable at 56%  On 80 IV Lasix q4. Only 1.9L in UOP yesterday. Overall net negative 18L diuresis since admit. Daily wts unreliable. CVP 14. Feels that he is not urinating enough. On for RHC today.   SCr stable, 1.17 today K 3.9  Mg 1.9    TEE: EF 20% Moderate to severe RV dysfunction. No endocarditis  Denies CP. No dyspnea. No F/C.    Objective:   Weight Range: (!) 162.4 kg Body mass index is 45.96 kg/m.   Vital Signs:   Temp:  [97.7 F (36.5 C)-98.5 F (36.9 C)] 97.9 F (36.6 C) (02/24 0820) Pulse Rate:  [100-163] 104 (02/23 1918) Resp:  [17-20] 19 (02/24 0435) BP: (95-115)/(52-79) 115/59 (02/24 0820) SpO2:  [93 %-97 %] 97 % (02/24 0820) Weight:  [162.4 kg] 162.4 kg (02/24 0435) Last BM Date : 04/07/21  Weight change: Filed Weights   04/08/21 0412 04/09/21 0147 04/10/21 0435  Weight: (!) 159.9 kg (!) 159.7 kg (!) 162.4 kg    Intake/Output:   Intake/Output Summary (Last 24 hours) at 04/10/2021 0838 Last data filed at 04/10/2021 0600 Gross per 24 hour  Intake 1551.68 ml  Output 1875 ml  Net -323.32 ml      Physical Exam    CVP 14  General:  young [name], obese . No respiratory difficulty HEENT: normal Neck: supple. JVD 14 cm. Carotids 2+ bilat; no bruits. No lymphadenopathy or thyromegaly appreciated. Cor: PMI nondisplaced. Regular rate & rhythm. No rubs, gallops or murmurs. Lungs: decreased BS at the bases bilaterally  Abdomen: obese, oft, nontender, nondistended. No hepatosplenomegaly. No bruits or masses. Good bowel sounds. Extremities: no cyanosis, clubbing, rash, 1-2+ b/l LE edema + RUE PICC  Neuro: alert & oriented x 3, cranial nerves grossly intact. moves all 4 extremities w/o difficulty. Affect pleasant.   Telemetry   Sinus tach 110s, few PVCs, ~1/ min Personally reviewed   Labs     CBC Recent Labs    04/09/21 0432 04/10/21 0435  WBC 8.9 8.7  HGB 10.1* 9.8*  HCT 30.2* 30.3*  MCV 90.4 92.7  PLT 296 281   Basic Metabolic Panel Recent Labs    88/32/54 0432 04/10/21 0435  NA 132* 132*  K 3.5 3.9  CL 91* 93*  CO2 31 29  GLUCOSE 165* 205*  BUN 22* 26*  CREATININE 1.10 1.17  CALCIUM 9.0 9.0  MG 2.0 1.9   Liver Function Tests Recent Labs    04/08/21 0422  AST 21  ALT 22  ALKPHOS 87  BILITOT 0.3  PROT 6.8  ALBUMIN 2.6*   No results for input(s): LIPASE, AMYLASE in the last 72 hours. Cardiac Enzymes No results for input(s): CKTOTAL, CKMB, CKMBINDEX, TROPONINI in the last 72 hours.  BNP: BNP (last 3 results) Recent Labs    10/28/20 1315 03/12/21 1505 04/05/21 0148  BNP 1,547.7* 448.3* 460.5*    ProBNP (last 3 results) No results for input(s): PROBNP in the last 8760 hours.   D-Dimer No results for input(s): DDIMER in the last 72 hours. Hemoglobin A1C No results for input(s): HGBA1C in the last 72 hours.  Fasting Lipid Panel No results for input(s): CHOL, HDL, LDLCALC, TRIG, CHOLHDL, LDLDIRECT in the last 72 hours. Thyroid Function Tests No results for input(s): TSH, T4TOTAL, T3FREE, THYROIDAB in the last 72 hours.  Invalid input(s):  FREET3  Other results:   Imaging    [place] EKG SITE RITE  Result Date: 04/09/2021 If [facility] image not attached, placement could not be confirmed due to current cardiac rhythm.    Medications:     Scheduled Medications:  amiodarone  100 mg Oral Daily   Chlorhexidine Gluconate Cloth  6 each Topical Daily   digoxin  125 mcg Oral Daily   Dulaglutide  1.5 mg Subcutaneous Q Wed   furosemide  80 mg Intravenous Q4H   insulin aspart  0-15 Units Subcutaneous TID WC   insulin aspart  0-5 Units Subcutaneous QHS   insulin aspart  10 Units Subcutaneous TID WC   insulin glargine-yfgn  20 Units Subcutaneous BID   isosorbide-hydrALAZINE  1 tablet Oral TID   linezolid  600 mg Oral Q12H   potassium  chloride  40 mEq Oral BID   ramelteon  8 mg Oral QHS   sacubitril-valsartan  1 tablet Oral BID   sodium chloride flush  10-40 mL Intracatheter Q12H   sodium chloride flush  3 mL Intravenous Q12H   sodium chloride flush  3 mL Intravenous Q12H   spironolactone  25 mg Oral Daily    Infusions:  sodium chloride Stopped (04/09/21 1100)   sodium chloride     sodium chloride 10 mL/hr at 04/10/21 0017   magnesium sulfate bolus IVPB     milrinone 0.5 mcg/kg/min (04/10/21 0426)    PRN Medications: sodium chloride, sodium chloride, acetaminophen **OR** acetaminophen, guaiFENesin-dextromethorphan, senna-docusate, sodium chloride flush, sodium chloride flush, traMADol    Patient Profile     Mr Gochanour is a 36 y.o.with a history of chronic HFrEF, NICM, HTN, asthma, OSA, morbid obesity, and uncontrolled DM.   On home milrinone.  Assessment/Plan   1.  End Stage HFrEF: Nonischemic cardiomyopathy. Echo back in 2021 EF 30-35 % with moderate RV dysfunction in the setting of recurrent RBBB with significant dyssynchrony.  Echo 10/2020 EF < 20% and moderate RV dysfunction. He has been on home milrinone 0.375 with [facility] following since 9/22.  Not thought to be candidate for advanced therapies with obesity and noncompliance. Echo in 1/23 reviewed, shows EF < 20%, mild LV dilation, severe RV dysfunction with moderate RV enlargement.  He has developed NYHA class IV symptoms with cardiogenic shock, co-ox 52% on admit with elevated lactate.  - On home milrinone 0.375. Increased milrinone to 0.5 mcg/kg/min this admit. Co-ox 56% today  - Volume status improving but remains somewhat elevated. CVP 14. Continue IV diuresis and plan RHC today  - Continue Entresto 24/26 mg BID - Continue spiro to 25 mg daily. - Continue digoxin 0.125. Dig level 0.4 - Intolerant SGLT2i due to candidiasis - Continue Bidil 1 tablet tid, BP stable. - No beta blocker d/t low output - Not candidate for transplant currently due  to size (BMI 44). HgbA1c improved from > 14 to 8.8%.  Could consider LVAD, but RV function marginal He has been with [facility]. Continue diuresis and plan for [facility] tomorrow. No endocarditis on TEE. If felt to be potential candidate for VAD, would need to complete IV abx and have negative f/u BC. 2. DM2: Hgb A1c 8.8%.  - No SGLT2i d/t candidiasis 3. AKI on CKD stage 3: Creatinine 2.14 initially, improved to 1.1 with diuresis and inotrope support - Follow closely.  4. Paroxysmal Atrial Flutter: Sinus tach today. - Continue amio to 100 mg daily.  - Hold Eliqus for cath today   5. OSA: Severe OSA  AHI 65 - Does not wear CPAP. 6. Obesity: Body mass index is 45.96 kg/m. 7. ID - Bld CX. Enterococcus faecalis and Staph epidermidis bacteremia. ID following. Possible PICC colonization. On ceftriaxone + ampicllin. - BC 02/19 and 02/20 with NGTD - TEE 2/23 negative for endocarditis 8. PVCs - improving, ~1/min on tele  - Keep K > 4.0 Mg > 2.0 - On amiodarone 100 mg daily. For AFL.   Length of Stay: [address], PA-C  04/10/2021, 8:38 AM  Advanced Heart Failure Team Pager [phone] (M-F; 7a - 5p)  Please contact [facility] for night-coverage after hours (5p -7a ) and weekends on [email or web address]  Patient seen and examined with the above-signed Advanced Practice Provider and/or Housestaff. I personally reviewed laboratory data, imaging studies and relevant notes. I independently examined the patient and formulated the important aspects of the plan. I have edited the note to reflect any of my changes or salient points. I have personally discussed the plan with the patient and/or family.  Denies CP or SOB. Co-ox marginal. Feels like he is not urinating enough. Remains on milrinone 0.5. Scr stable. PICC back in   ID has stopped IV abx. Recommending linazeolid x 2 weeks  General:  Obese No resp difficulty HEENT: normal Neck: supple. JVP to jaw  Carotids 2+ bilat; no bruits. No  lymphadenopathy or thryomegaly appreciated. Cor: PMI nondisplaced. Regular tachy  No rubs, gallops or murmurs. Lungs: clear Abdomen: soft, nontender, nondistended. No hepatosplenomegaly. No bruits or masses. Good bowel sounds. Extremities: no cyanosis, clubbing, rash, 1+ edema + unna boots Neuro: alert & orientedx3, cranial nerves grossly intact. moves all 4 extremities w/o difficulty. Affect pleasant  Remains very tenuous despite high-dose milrinone. Question now is whether RV is strong enough to tolerate VAD. Will plan R/L cath today.  Appreciate ID recs. Off IV abx. Linaeolid x 2 weeks. If he is VAD candidate will need negative bcx off abx prior to proceeding.   Glori Bickers, MD  10:39 AM

## 2021-04-10 NOTE — Anesthesia Postprocedure Evaluation (Signed)
Anesthesia Post Note  Patient: Jonathan [name]  Procedure(s) Performed: TRANSESOPHAGEAL ECHOCARDIOGRAM (TEE)     Patient location during evaluation: PACU Anesthesia Type: MAC Level of consciousness: awake and alert Pain management: pain level controlled Vital Signs Assessment: post-procedure vital signs reviewed and stable Respiratory status: spontaneous breathing, nonlabored ventilation, respiratory function stable and patient connected to nasal cannula oxygen Cardiovascular status: stable and blood pressure returned to baseline Postop Assessment: no apparent nausea or vomiting Anesthetic complications: no   No notable events documented.  Last Vitals:  Vitals:   04/09/21 2320 04/10/21 0435  BP: 109/64 114/70  Pulse:    Resp: 17 19  Temp: 36.9 C 36.7 C  SpO2: 93% 95%    Last Pain:  Vitals:   04/10/21 0435  TempSrc: Oral  PainSc:                  [name]

## 2021-04-10 NOTE — Progress Notes (Deleted)
Mobility Specialist Progress Note:   04/10/21 1500  Mobility  Activity Ambulated with assistance in hallway  Level of Assistance Standby assist, set-up cues, supervision of patient - no hands on  Assistive Device None  Distance Ambulated (ft) 260 ft  Activity Response Tolerated well  $Mobility charge 1 Mobility   Pt agreeable to mobility session at this time. Required no physical assistance throughout. Pt back in bed with all needs met.   Nelta Numbers [facility] Phone: [phone] Office Phone: [phone]

## 2021-04-10 NOTE — Progress Notes (Addendum)
HD#5 SUBJECTIVE:  Patient Summary: Jonathan [name] is a 36 yo male with nonischemic cardiomyopathy, end-stage heart failure on a milrinone drip at home, type 2 diabetes, and obesity who presents to [facility] with dyspnea on exertion and difficulty moving over the last few days.   Overnight Events: TEE    Interm History: patient seen and evaluated. Had cath this morning. Doing well. He is trying not to use right arm much. No other complaints.   OBJECTIVE:  Vital Signs: Vitals:   04/10/21 1132 04/10/21 1137 04/10/21 1142 04/10/21 1152  BP: 112/78 124/66 117/89 (!) 147/120  Pulse: (!) 106 (!) 106 (!) 107 (!) 0  Resp: (!) 29 (!) 26 17   Temp:      TempSrc:      SpO2: 99% 100% 94%   Weight:      Height:       SpO2: 94 % O2 Flow Rate (L/min): 2 L/min  Filed Weights   04/08/21 0412 04/09/21 0147 04/10/21 0435  Weight: (!) 159.9 kg (!) 159.7 kg (!) 162.4 kg     Intake/Output Summary (Last 24 hours) at 04/10/2021 1655 Last data filed at 04/10/2021 1301 Gross per 24 hour  Intake 785.51 ml  Output 1900 ml  Net -1114.49 ml   Net IO Since Admission: -18,795.38 mL [04/10/21 1655]  Physical Exam:  General: alert and oriented, no acute distress HEENT: PEERL Neck: supple Cardiac: tachycardic, regular rhythm, no MRG. PICC left arm.  Lungs: CTAB, no wheeze, rales, or rhnonchi, on supplemental O2 [place] Abd: soft, NT, not distended, + BS Extremities: 1+ edema to upper shin , wraps in place. Bandage in place right wrist.  Neuro: alert and oriented, moves all extremities Psych: mood and affect appropriate.   Patient Lines/Drains/Airways Status     Active Line/Drains/Airways     Name Placement date Placement time Site Days   PICC Double Lumen 10/28/20 PICC Right Cephalic 39 cm 0 cm [id number]  1851  -- 159            Pertinent Labs: CBC Latest Ref Rng & Units 04/10/2021 04/09/2021 04/08/2021  WBC 4.0 - 10.5 K/uL 8.7 8.9 8.5  Hemoglobin 13.0 - 17.0 g/dL 9.8(L) 10.1(L) 10.7(L)   Hematocrit 39.0 - 52.0 % 30.3(L) 30.2(L) 31.9(L)  Platelets 150 - 400 K/uL 281 296 312    CMP Latest Ref Rng & Units 04/10/2021 04/09/2021 04/08/2021  Glucose 70 - 99 mg/dL 205(H) 165(H) -  BUN 6 - 20 mg/dL 26(H) 22(H) -  Creatinine 0.61 - 1.24 mg/dL 1.17 1.10 -  Sodium 135 - 145 mmol/L 132(L) 132(L) -  Potassium 3.5 - 5.1 mmol/L 3.9 3.5 3.7  Chloride 98 - 111 mmol/L 93(L) 91(L) -  CO2 22 - 32 mmol/L 29 31 -  Calcium 8.9 - 10.3 mg/dL 9.0 9.0 -  Total Protein 6.5 - 8.1 g/dL - - -  Total Bilirubin 0.3 - 1.2 mg/dL - - -  Alkaline Phos 38 - 126 U/L - - -  AST 15 - 41 U/L - - -  ALT 0 - 44 U/L - - -    Recent Labs    04/10/21 0804 04/10/21 0955 04/10/21 1223  GLUCAP 182* 171* 156*     Pertinent Imaging: CARDIAC CATHETERIZATION  Result Date: 04/10/2021   The left ventricular ejection fraction is less than 25% by visual estimate. Findings: On milrinone 0.5 mcg/kg/min Ao =87/63 (73) LV = 105/23 RA =  15 RV = 55/22 PA = 59/28 (40) PCW =  24 Fick cardiac output/index = 6.6/2.4 PVR = 2.4 FA sat = 98% PA sat = 55%, 56% PAPi = 2.2 RA/PCW = 0.625 Assessment: 1. Normal coronary arteries (AV groove LCx not seen completely but appears grossly normal) 2. Severe NICM EF < 20 3. Elevated biventricular pressures with normal cardiac output on high-dose milrinone Plan/Discussion: RV indices marginal for VAD in setting of high-dose milrinone. Continue diuresis. Will d/w VAD team. Glori Bickers, MD 11:56 AM   ASSESSMENT/PLAN:  Assessment: Active Problems:   Diabetes mellitus type 2 in obese [facility])   Essential hypertension   Acute exacerbation of CHF (congestive heart failure) (HCC)   Hyponatremia with excess extracellular fluid volume   Positive blood culture  # acute on chronic end stage heart failure with reduced EF Noted to be approximately 15 lbs over dry weight. Approx 2L UOP yesterday. Volume is still up. He appears to be making improvements daily. He was able to ambulate down the hallway  with PT this morning without any supplemental O2.  - Heart failure is following. PICC line has been placed. Patient is receiving milrinone, Digoxin 0.125mg , Bidil TID, lasix 80 mg Q4hr Arlyce Harman 25mg  daily. - AKI has resolved and he is on entresto 24/26 BID - Unable to start patient on SGLT2 due to candidasis. BB contraindicated due to low output.  - Patient is nearing euvolemia but will be continued on IV diuresis today. Volume still up, will need to be continued on IV diuresis. He will be initiated on metolazone. Underwent R/L heart cath today. Will follow up results and recommendations.  - will continue I+Os, daily weights  Possible Bacteremia  +Blood Cultures Blood cultures positive for E faecalis and staph epidermidis in one set of blood cultures drawn through is PICC line on admission. Blood cultures collected 2/19 negative 4 days and Bcx from 2/20 no growth 3 days. He underwent TEE which showed no concern for endocarditis.  - Ampicillin and ceftriaxone have been discontinued. He is now on oral linezolid 2 weeks. ID following.   # Hx atrial flutter - Patient is currently in normal sinus. He is on 100mg  amio daily as well as Eliquis.   # AKI - Resolved with diuresis  Hyperkalemia - due to aggressive diuresis. Repleted. Will need to continue to monitor,.   Type 2 diabetes On sliding scale with meals and bedtime. Glucose has been running high in 200's despite [place]. Okay to take Trulicity. Continue CBG checks.   Signature: Delene Ruffini, MD  Internal Medicine Resident, PGY-1 [facility]  Pager: [phone] 4:56 PM, 04/10/2021   Please contact the on call pager after 5 pm and on weekends at [phone].

## 2021-04-10 NOTE — Progress Notes (Signed)
Cath lab here to take patient down for procedure.

## 2021-04-10 NOTE — Progress Notes (Signed)
Mobility Specialist Progress Note:   04/10/21 1500  Mobility  Activity Refused mobility   Pt refusing mobility at this time stating he "cant move his wrist therefore he is not feeling it today". Will f/u as schedule permits.  Addison Lank [facility] Phone: [phone] Office Phone: [phone]

## 2021-04-10 NOTE — Progress Notes (Signed)
[facility] EDUCATION NOTE:                VAD evaluation consent reviewed and signed by patient.  Initial VAD teaching started with pt and caregiver (wife via phone).   VAD educational packet including "Understanding Your Options with Advanced Heart Failure", "[place] Patient Agreement for VAD Evaluation and Potential Implantation" consent, and [employer] "Heartmate 3 Left Ventricular Device (LVAD) Patient Guide", Heartmate 3 Left Ventricular Assist System Patient Education Program DVD", "[place] HM III Patient Education", "[place] Mechanical Circulatory Support Program", and "Decision Aids for Left Ventricular Assist Device" reviewed in detail and left at bedside for continued reference.   All questions answered regarding VAD implant, hospital stay, and what to expect when discharged home living with a heart pump. Pt identified Jonathan [name] (wife) as his primary caregiver and his mother as back-up if this therapy should be deemed appropriate for HM III LVAD.    Reviewed and supplied a copy of home inspection check list stressing that only three pronged grounded power outlets can be used for VAD equipment. Patient confirmed home has electrical outlets that will support the equipment along with access working telephone.  Identified the following lifestyle modifications while living on MCS:   1. No tub baths while pump implanted, and shower only when doctor gives permission.   2. No swimming or submersion in water while implanted with pump.   3. Need to take warfarin (blood thinner) as long as you have the pump. This will require routine lab draws to check INR (thickness of blood).   Intermacs patient survival statistics through September 2022 reviewed with patient as follows:                                               The patient understands that from this discussion it does not mean that they will receive the device, but that depends on an extensive evaluation process. The patient is aware of the  fact that if at anytime they want to stop the evaluation process they can.   [name] RN, VAD Coordinator  Office: [phone] 24/7 VAD Pager: [phone]

## 2021-04-10 NOTE — Consult Note (Signed)
Palliative Medicine Inpatient Consult Note  Consulting Provider: Jolaine Artist, MD  Reason for consult:   [place] Palliative Medicine Consult  Reason for Consult? VAD evaluation   HPI:  Per intake H&P --> Jonathan [name] is a 36 yo male with nonischemic cardiomyopathy, end-stage heart failure on a milrinone drip at home, type 2 diabetes, and obesity who presents to [facility] with dyspnea on exertion and difficulty moving over the last few days.   Palliative care has been asked to get involved for a VAD evaluation.  Clinical Assessment/Goals of Care:  *Please note that this is a verbal dictation therefore any spelling or grammatical errors are due to the "[place]" system interpretation.  I have reviewed medical records including EPIC notes, labs and imaging, received report from bedside RN, assessed the patient who is lying in bed in no acute distress.    I met with Roland Rack and his spouse, Felix Pacini to further discuss diagnosis prognosis, GOC, EOL wishes, disposition and options.   I introduced Palliative Medicine as specialized medical care for people living with serious illness. It focuses on providing relief from the symptoms and stress of a serious illness. The goal is to improve quality of life for both the patient and the family.  Medical History Review and Understanding:  Reviewed patients history of asthma, end state heart failure, Type 2 DM, Obesity, OSA.   We discussed his severe cardiomypoathy  and what it means in the larger context of His on-going co-morbidities. Natural disease trajectory and expectations were discussed.  Reviewed that patient's parents both suffer from heart failure.  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  Social History:  Dorian Pod lives in [place], [place] in a single-family home. Rasheed and his wife have been together for 8 years and married for 4 years. He and his wife met  at the homeless shelter. They have no children but they share 2 dogs 1 golden/German shepherd mix and 1 pit bull/lab mix.  Antony Haste has a love of music and plays the keyboard.  He is also identified by his wife to be very technological and can "fix all the things".  He is a man of the [place] though is non-denominational.  Functional and Nutritional State:  Antony Haste is able to walk around the block and does so twice a day with his milrinone CADD pump.   Dorian Pod has a robust appetite though per discussion with he and his wife he has been consciously trying to lose weight in anticipation of LVAD and hope for future heart transplant.  Advance Directives: A detailed discussion was had today regarding advanced directives.  Advance directives were completed in September of this year and can be found in [place].  Code Status:  Concepts specific to code status, artifical feeding and hydration, continued IV antibiotics and rehospitalization was had.  Horacio would wish for all interventions with the exception of intubation as he would not want to be reliant on an artificial breathing device.  Goals for the Future:  Patient is anxious to get an LVAD and be considered in the future for a heart transplant. He shares the hope to lose 50-60lbs in the ongoing months to make this a possibility. Eloise and his wife express great faith in Dr. Haroldine Laws and agree to follow his recommendations into the future.   From the perspective of social support, Mithcell has six siblings. His parents travel between here and [place]. He has  one sibling who is local who can help should the need arise.  Patient hopes in the future to be able to travel to [place], [place], [place].    Discussed the importance of continued conversation with family and their  medical providers regarding overall plan of care and treatment options, ensuring decisions are within the context of the patients values and GOCs.  Decision Maker: Jeronimo Hellberg (spouse)  [phone]  SUMMARY OF RECOMMENDATIONS   Partial - No intubation  Patient would like to proceed with LVAD if offered, has family support through wife, parents, and a sibling who lives locally  Patient motivated to lose > 50-60lbs in the future  Patient and his wife are amenable to following the recommendations of Dr. Haroldine Laws moving forward  Patient and his wife spoke to LVAD coordinator and understand the benefits and risks associated with this procedure  Patient would like to travel within the [place]  Ongoing Palliative support  Code Status/Advance Care Planning: Partial - No intubation   Palliative Prophylaxis:  Aspiration, Bowel Regimen, Delirium Protocol, Frequent Pain Assessment, Oral Care, Palliative Wound Care, and Turn Reposition  Additional Recommendations (Limitations, Scope, Preferences): Full Scope Treatment  Psycho-social/Spiritual:  Desire for further Chaplaincy support: Not presently Additional Recommendations: Education on severe heart failure   Prognosis: Will depend on future devices/surgical options. Severe disease, without advanced therapies would meet criteria for hospice.  Discharge Planning: Likely home once medically optimized.  Vitals:   04/10/21 1142 04/10/21 1152  BP: 117/89 (!) 147/120  Pulse: (!) 107 (!) 0  Resp: 17   Temp:    SpO2: 94%     Intake/Output Summary (Last 24 hours) at 04/10/2021 1559 Last data filed at 04/10/2021 1301 Gross per 24 hour  Intake 785.51 ml  Output 1900 ml  Net -1114.49 ml   Last Weight  Most recent update: 04/10/2021  4:39 AM    Weight  162.4 kg (358 lb)              Gen: Young African-American male in no acute distress HEENT: moist mucous membranes CV: Irregular rate and regular rhythm PULM: On room air breathing is even and nonlabored ABD: soft/nontender EXT: No edema Neuro: Alert and oriented x3  PPS: 50-60%   This conversation/these recommendations were discussed with patient primary care  team, Dr. Haroldine Laws.  MDM High ______________________________________________________ [facility] Team Team Cell Phone: [phone] Please utilize secure chat with additional questions, if there is no response within 30 minutes please call the above phone number  Palliative Medicine Team providers are available by phone from 7am to 7pm daily and can be reached through the team cell phone.  Should this patient require assistance outside of these hours, please call the patient's attending physician.

## 2021-04-10 NOTE — Plan of Care (Signed)

## 2021-04-10 NOTE — Progress Notes (Signed)
Received report from [facility] in cath lab.

## 2021-04-10 NOTE — Progress Notes (Signed)
Initial Nutrition Assessment  DOCUMENTATION CODES:   Not applicable  INTERVENTION:   Multivitamin w/ minerals daily Encourage good PO intake  NUTRITION DIAGNOSIS:   Increased nutrient needs related to chronic illness (CHF) as evidenced by estimated needs.  GOAL:   Patient will meet greater than or equal to 90% of their needs  MONITOR:   PO intake, I & O's, Labs  REASON FOR ASSESSMENT:   Consult Assessment of nutrition requirement/status, LVAD Eval  ASSESSMENT:   36 y.o. male presented to the ED with dyspnea on exertion and difficulty moving. PMH includes T2DM, CHF, and HTN. Pt admitted with CHF exacerbation and AKI.   2/23 - TEE   RD received  a consult to evaluate pt for LVAD placement.  RD working remotely, unable to reach pt via phone.   Per EMR, pt intake includes: 2/20: Dinner 100% 2/21: Breakfast 100%, Lunch 100%, Dinner 100% 2/22: Lunch 100%, Dinner 100% 2/23: Lunch 100%  Per EMR, pt has not had any weight loss.   Medications reviewed and include: Lasix, SSI 0-15 units TID + 0-5 units daily + 10 units TID, Semglee, Linezolid, Potassium Chloride, Spironolactone Labs reviewed: Sodium 132, Hgb A1c 8.8%, 24 hr CBG 156-232  UOP: 1875 mL x 24 hrs I/O's: -18.8 L since admit  NUTRITION - FOCUSED PHYSICAL EXAM:  Deferred to follow-up.  Diet Order:   Diet Order             Diet Heart Room service appropriate? Yes; Fluid consistency: Thin  Diet effective now                   EDUCATION NEEDS:   No education needs have been identified at this time  Skin:  Skin Assessment: Reviewed RN Assessment  Last BM:  No Documentation  Height:   Ht Readings from Last 1 Encounters:  04/05/21 6\' 2"  (1.88 m)    Weight:   Wt Readings from Last 1 Encounters:  04/10/21 (!) 162.4 kg    Ideal Body Weight:  86.4 kg  BMI:  Body mass index is 45.96 kg/m.  Estimated Nutritional Needs:   Kcal:  3000-3200  Protein:  105-130 grams  Fluid:  1.5-2  L    04/12/21 RD, LDN Clinical Dietitian See [facility] for contact information.

## 2021-04-11 ENCOUNTER — Inpatient Hospital Stay (HOSPITAL_COMMUNITY): Payer: Medicaid Other

## 2021-04-11 DIAGNOSIS — Z515 Encounter for palliative care: Secondary | ICD-10-CM | POA: Diagnosis not present

## 2021-04-11 DIAGNOSIS — I509 Heart failure, unspecified: Secondary | ICD-10-CM | POA: Diagnosis not present

## 2021-04-11 DIAGNOSIS — R911 Solitary pulmonary nodule: Secondary | ICD-10-CM | POA: Diagnosis not present

## 2021-04-11 DIAGNOSIS — J439 Emphysema, unspecified: Secondary | ICD-10-CM | POA: Diagnosis not present

## 2021-04-11 DIAGNOSIS — I428 Other cardiomyopathies: Secondary | ICD-10-CM | POA: Diagnosis not present

## 2021-04-11 DIAGNOSIS — R918 Other nonspecific abnormal finding of lung field: Secondary | ICD-10-CM | POA: Diagnosis not present

## 2021-04-11 DIAGNOSIS — Z7189 Other specified counseling: Secondary | ICD-10-CM | POA: Diagnosis not present

## 2021-04-11 LAB — HEPATITIS B CORE ANTIBODY, IGM: Hep B C IgM: NONREACTIVE

## 2021-04-11 LAB — BASIC METABOLIC PANEL
Anion gap: 11 (ref 5–15)
BUN: 27 mg/dL — ABNORMAL HIGH (ref 6–20)
CO2: 27 mmol/L (ref 22–32)
Calcium: 8.5 mg/dL — ABNORMAL LOW (ref 8.9–10.3)
Chloride: 93 mmol/L — ABNORMAL LOW (ref 98–111)
Creatinine, Ser: 1.15 mg/dL (ref 0.61–1.24)
GFR, Estimated: >60 mL/min (ref >60)
Glucose, Bld: 165 mg/dL — ABNORMAL HIGH (ref 70–99)
Potassium: 3.5 mmol/L (ref 3.5–5.1)
Sodium: 131 mmol/L — ABNORMAL LOW (ref 135–145)

## 2021-04-11 LAB — CBC
HCT: 34.1 % — ABNORMAL LOW (ref 39.0–52.0)
Hemoglobin: 11.3 g/dL — ABNORMAL LOW (ref 13.0–17.0)
MCH: 30.3 pg (ref 26.0–34.0)
MCHC: 33.1 g/dL (ref 30.0–36.0)
MCV: 91.4 fL (ref 80.0–100.0)
Platelets: 256 10*3/uL (ref 150–400)
RBC: 3.73 MIL/uL — ABNORMAL LOW (ref 4.22–5.81)
RDW: 13.8 % (ref 11.5–15.5)
WBC: 6 10*3/uL (ref 4.0–10.5)
nRBC: 0 % (ref 0.0–0.2)

## 2021-04-11 LAB — CULTURE, BLOOD (ROUTINE X 2)
Culture: NO GROWTH
Culture: NO GROWTH

## 2021-04-11 LAB — COOXEMETRY PANEL
Carboxyhemoglobin: 1.7 % — ABNORMAL HIGH (ref 0.5–1.5)
Methemoglobin: <0.7 % (ref 0.0–1.5)
O2 Saturation: 60.7 %
Total hemoglobin: 10.1 g/dL — ABNORMAL LOW (ref 12.0–16.0)

## 2021-04-11 LAB — GLUCOSE, CAPILLARY
Glucose-Capillary: 159 mg/dL — ABNORMAL HIGH (ref 70–99)
Glucose-Capillary: 172 mg/dL — ABNORMAL HIGH (ref 70–99)
Glucose-Capillary: 155 mg/dL — ABNORMAL HIGH (ref 70–99)
Glucose-Capillary: 164 mg/dL — ABNORMAL HIGH (ref 70–99)

## 2021-04-11 LAB — HEPATITIS B SURFACE ANTIBODY,QUALITATIVE: Hep B S Ab: REACTIVE — AB

## 2021-04-11 LAB — URIC ACID
Uric Acid, Serum: 13 mg/dL — ABNORMAL HIGH (ref 3.7–8.6)
Uric Acid, Serum: 13.6 mg/dL — ABNORMAL HIGH (ref 3.7–8.6)

## 2021-04-11 LAB — ANTITHROMBIN III: AntiThromb III Func: 114 % (ref 75–120)

## 2021-04-11 LAB — LACTATE DEHYDROGENASE: LDH: 194 U/L — ABNORMAL HIGH (ref 98–192)

## 2021-04-11 LAB — ABO/RH: ABO/RH(D): O POS

## 2021-04-11 LAB — HEPATITIS B SURFACE ANTIGEN: Hepatitis B Surface Ag: NONREACTIVE

## 2021-04-11 LAB — PREALBUMIN: Prealbumin: 17.5 mg/dL — ABNORMAL LOW (ref 18–38)

## 2021-04-11 LAB — T4, FREE: Free T4: 1.2 ng/dL — ABNORMAL HIGH (ref 0.61–1.12)

## 2021-04-11 LAB — LIPID PANEL
Cholesterol: 131 mg/dL (ref 0–200)
HDL: 23 mg/dL — ABNORMAL LOW (ref >40)
LDL Cholesterol: 97 mg/dL (ref 0–99)
Total CHOL/HDL Ratio: 5.7 RATIO
Triglycerides: 53 mg/dL (ref <150)
VLDL: 11 mg/dL (ref 0–40)

## 2021-04-11 LAB — HEPATITIS C ANTIBODY: HCV Ab: NONREACTIVE

## 2021-04-11 MED ORDER — METOLAZONE 5 MG PO TABS
5.0000 mg | ORAL_TABLET | Freq: Once | ORAL | Status: AC
  Administered 2021-04-11: 5 mg via ORAL
  Filled 2021-04-11: qty 1

## 2021-04-11 MED ORDER — POTASSIUM CHLORIDE CRYS ER 20 MEQ PO TBCR
40.0000 meq | EXTENDED_RELEASE_TABLET | Freq: Once | ORAL | Status: AC
Start: 2021-04-11 — End: 2021-04-11
  Administered 2021-04-11: 40 meq via ORAL
  Filled 2021-04-11: qty 2

## 2021-04-11 MED ORDER — POTASSIUM CHLORIDE CRYS ER 20 MEQ PO TBCR
40.0000 meq | EXTENDED_RELEASE_TABLET | Freq: Once | ORAL | Status: AC
  Administered 2021-04-11: 40 meq via ORAL
  Filled 2021-04-11: qty 2

## 2021-04-11 MED ORDER — POTASSIUM CHLORIDE CRYS ER 20 MEQ PO TBCR
20.0000 meq | EXTENDED_RELEASE_TABLET | Freq: Once | ORAL | Status: AC
  Administered 2021-04-11: 20 meq via ORAL
  Filled 2021-04-11: qty 1

## 2021-04-11 NOTE — Consult Note (Signed)
[address]            [place] [address]          [phone]       Samual Westhoff [facility] Medical Record [id number] Date of Birth: 02-09-1986  No ref. provider found Dr. Trina Ao, MD  Chief Complaint:    Chief Complaint  Patient presents with   Weakness  Patient examined, images of CT scan, echocardiogram, and right heart cath data personally reviewed and counseled with patient.  History of Present Illness:     36 year old morbidly obese 350 pound diabetic male has been followed in the heart failure clinic by Dr. Haroldine Laws for several years.  He is on home milrinone.  His PICC line became infected and he was admitted with bacteremia and worsening heart failure.  He has been treated for the bloodstream infection and the PICC line has been changed out and he is back on IV milrinone.  He states he is ambulatory following bilateral foot surgery but he has poor quality of life and poor functional status due to heart failure.  His EF is 15%.  Echo shows biventricular dysfunction, RV hemodynamics are very borderline with CVP/wedge ratio 0.6 and PA power index 2.0.  Echo images show moderate to severe TR as well.  The patient's CT scan shows a spiculated 8 mm left upper lobe nodule.  No previous CT scans of the chest for which to compare.  Recent CT of the abdomen also has shown significant ascites.  LFTs currently are unremarkable except for reduced albumin and prealbumin.   Current Activity/ Functional Status: Zubrod Score: Currently enrolled in hospice At the time of surgery this patients most appropriate activity status/level should be described as: []     0    Normal activity, no symptoms []     1    Restricted in physical strenuous activity but ambulatory, able to do out light work []     2    Ambulatory and capable of self care, unable to do work activities, up and about >50 % of waking hours                              [x]      3    Only limited self care, in bed greater than 50% of waking hours []     4    Completely disabled, no self care, confined to bed or chair []     5    Moribund    Past Medical History:  Diagnosis Date   Acute pain of left foot 05/03/2017   Asthma    Asthma, chronic, mild persistent, uncomplicated [id number]   Charcot ankle, left 05/31/2017   CHF (congestive heart failure) (HCC)    Chronic systolic (congestive) heart failure ([facility])    Diabetes mellitus type 2 in obese ([place]) 04/21/2014   Diabetic polyneuropathy associated with type 2 diabetes mellitus ([place]) 03/14/2017   Essential hypertension 10/26/2016   Hypertension    Obesity    Obesity, Class III, BMI 40-49.9 (morbid obesity) ([place]) 10/26/2016   OSA (obstructive sleep apnea) 12/02/2016   Type 2 diabetes mellitus with diabetic neuropathy ([place])     Past Surgical History:  Procedure Laterality Date   CARDIAC CATHETERIZATION     FOOT SURGERY     arch surgery   RIGHT/LEFT HEART  CATH AND CORONARY ANGIOGRAPHY N/A 04/10/2021   Procedure: RIGHT/LEFT HEART CATH AND CORONARY ANGIOGRAPHY;  Surgeon: Jolaine Artist, MD;  Location: [facility];  Service: Cardiovascular;  Laterality: N/A;   TEE WITHOUT CARDIOVERSION N/A 04/09/2021   Procedure: TRANSESOPHAGEAL ECHOCARDIOGRAM (TEE);  Surgeon: Jolaine Artist, MD;  Location: [facility];  Service: Cardiovascular;  Laterality: N/A;    Social History   Tobacco Use  Smoking Status Never  Smokeless Tobacco Never    Social History   Substance and Sexual Activity  Alcohol Use No    Social History   Socioeconomic History   Marital status: Married    Spouse name: Not on file   Number of children: Not on file   Years of education: Not on file   Highest education level: Not on file  Occupational History   Not on file  Tobacco Use   Smoking status: Never   Smokeless tobacco: Never  Vaping Use   Vaping Use: Never used  Substance and Sexual Activity   Alcohol use: No    Drug use: No   Sexual activity: Never  Other Topics Concern   Not on file  Social History Narrative   Not on file   Social Determinants of Health   Financial Resource Strain: Low Risk    Difficulty of Paying Living Expenses: Not very hard  Food Insecurity: No Food Insecurity   Worried About Running Out of Food in the Last Year: Never true   Ran Out of Food in the Last Year: Never true  Transportation Needs: No Transportation Needs   Lack of Transportation (Medical): No   Lack of Transportation (Non-Medical): No  Physical Activity: Not on file  Stress: Not on file  Social Connections: Not on file  Intimate Partner Violence: Not on file    Allergies  Allergen Reactions   Hydrocodone     Hives     Current Facility-Administered Medications  Medication Dose Route Frequency Provider Last Rate Last Admin   0.9 %  sodium chloride infusion   Intravenous PRN Bensimhon, Shaune Pascal, MD   Stopped at 04/09/21 1100   0.9 %  sodium chloride infusion  250 mL Intravenous PRN Bensimhon, Shaune Pascal, MD       acetaminophen (TYLENOL) tablet 650 mg  650 mg Oral Q4H PRN Bensimhon, Shaune Pascal, MD   650 mg at 04/10/21 1820   amiodarone (PACERONE) tablet 100 mg  100 mg Oral Daily Bensimhon, Shaune Pascal, MD   100 mg at 04/11/21 [id number]   apixaban (ELIQUIS) tablet 5 mg  5 mg Oral BID Bensimhon, Shaune Pascal, MD   5 mg at 04/11/21 [id number]   Chlorhexidine Gluconate Cloth 2 % PADS 6 each  6 each Topical Daily Bensimhon, Shaune Pascal, MD   6 each at 04/11/21 0936   digoxin (LANOXIN) tablet 125 mcg  125 mcg Oral Daily Bensimhon, Shaune Pascal, MD   125 mcg at 04/11/21 [id number]   Dulaglutide SOPN 1.5 mg  1.5 mg Subcutaneous Q Wed Bensimhon, Shaune Pascal, MD   1.5 mg at 04/08/21 1849   furosemide (LASIX) injection 80 mg  80 mg Intravenous Q4H Bensimhon, Shaune Pascal, MD   80 mg at 04/11/21 1442   guaiFENesin-dextromethorphan (ROBITUSSIN DM) 100-10 MG/5ML syrup 5 mL  5 mL Oral Q4H PRN Bensimhon, Shaune Pascal, MD   5 mL at 04/10/21 2000   insulin aspart  (novoLOG) injection 0-15 Units  0-15 Units Subcutaneous TID WC Bensimhon, Shaune Pascal, MD   3 Units at  04/11/21 1329   insulin aspart (novoLOG) injection 0-5 Units  0-5 Units Subcutaneous QHS Bensimhon, Shaune Pascal, MD   2 Units at 04/07/21 2200   insulin aspart (novoLOG) injection 10 Units  10 Units Subcutaneous TID WC Bensimhon, Shaune Pascal, MD   10 Units at 04/11/21 1329   insulin glargine-yfgn (SEMGLEE) injection 20 Units  20 Units Subcutaneous BID Bensimhon, Shaune Pascal, MD   20 Units at 04/11/21 0925   isosorbide-hydrALAZINE (BIDIL) 20-37.5 MG per tablet 1 tablet  1 tablet Oral TID Bensimhon, Shaune Pascal, MD   1 tablet at 04/11/21 [id number]   linezolid (ZYVOX) tablet 600 mg  600 mg Oral Q12H Bensimhon, Shaune Pascal, MD   600 mg at 04/11/21 0929   milrinone (PRIMACOR) 20 MG/100 ML (0.2 mg/mL) infusion  0.5 mcg/kg/min Intravenous Continuous Bensimhon, Shaune Pascal, MD 23.8 mL/hr at 04/11/21 1447 0.5 mcg/kg/min at 04/11/21 1447   multivitamin with minerals tablet 1 tablet  1 tablet Oral Daily Bensimhon, Shaune Pascal, MD   1 tablet at 04/11/21 0929   ondansetron (ZOFRAN) injection 4 mg  4 mg Intravenous Q6H PRN Bensimhon, Shaune Pascal, MD       potassium chloride (KLOR-CON) packet 40 mEq  40 mEq Oral BID Bensimhon, Shaune Pascal, MD   40 mEq at 04/11/21 [id number]   ramelteon (ROZEREM) tablet 8 mg  8 mg Oral QHS Bensimhon, Shaune Pascal, MD   8 mg at 04/10/21 2003   sacubitril-valsartan (ENTRESTO) 24-26 mg per tablet  1 tablet Oral BID Bensimhon, Shaune Pascal, MD   1 tablet at 04/11/21 [id number]   senna-docusate (Senokot-S) tablet 1 tablet  1 tablet Oral QHS PRN Bensimhon, Shaune Pascal, MD       sodium chloride flush (NS) 0.9 % injection 10-40 mL  10-40 mL Intracatheter Q12H Bensimhon, Shaune Pascal, MD   10 mL at 04/11/21 [id number]   sodium chloride flush (NS) 0.9 % injection 10-40 mL  10-40 mL Intracatheter PRN Bensimhon, Shaune Pascal, MD   10 mL at 04/10/21 0941   sodium chloride flush (NS) 0.9 % injection 3 mL  3 mL Intravenous Q12H Bensimhon, Shaune Pascal, MD   3 mL at  04/10/21 2011   sodium chloride flush (NS) 0.9 % injection 3 mL  3 mL Intravenous Q12H Bensimhon, Shaune Pascal, MD   3 mL at 04/10/21 2011   sodium chloride flush (NS) 0.9 % injection 3 mL  3 mL Intravenous PRN Bensimhon, Shaune Pascal, MD       spironolactone (ALDACTONE) tablet 25 mg  25 mg Oral Daily Bensimhon, Shaune Pascal, MD   25 mg at 04/11/21 [id number]   traMADol (ULTRAM) tablet 50 mg  50 mg Oral Q6H PRN Bensimhon, Shaune Pascal, MD   50 mg at 04/10/21 2000     Family History  Problem Relation Age of Onset   Diabetes Mellitus II Mother    Heart failure Mother    Hypertension Mother    Heart disease Mother    Heart failure Father    Kidney disease Father    Heart disease Father    Congestive Heart Failure Neg Hx      Review of Systems:     Cardiac Review of Systems: Y or N  Chest Pain [    ]  Resting SOB [   ] Exertional SOB  [ y ]  Orthopnea [ y ]   Pedal Edema [  y ]    Palpitations [  ] Syncope  [  ]   Presyncope [   ]  General Review of Systems: [Y] = yes [  ]=no Constitional: recent weight change [  y]; anorexia [  ]; fatigue [  ]; nausea [  ]; night sweats [  ]; fever [ y ]; or chills [  ];                                                                                                                                          Dental: poor dentition[  ]; Last Dentist visit: Unknown  Eye : blurred vision [  ]; diplopia [   ]; vision changes [  ];  Amaurosis fugax[  ]; Resp: cough [  ];  wheezing[  ];  hemoptysis[  ]; shortness of breath[  ]; paroxysmal nocturnal dyspnea[  ]; dyspnea on exertion[ y ]; or orthopnea[  ];  GI:  gallstones[  ], vomiting[  ];  dysphagia[  ]; melena[  ];  hematochezia [  ]; heartburn[  ];   Hx of  Colonoscopy[  ]; GU: kidney stones [  ]; hematuria[  ];   dysuria [  ];  nocturia[  ];  history of     obstruction [  ];                 Skin: rash, swelling[  ];, hair loss[  ];  peripheral edema[  ];  or itching[  ]; Musculosketetal: myalgias[  ];  joint swelling[  ];  joint  erythema[  ];  joint pain[  ];  back pain[ y ];  Heme/Lymph: bruising[  ];  bleeding[  ];  anemia[  ];  Neuro: TIA[  ];  headaches[  ];  stroke[  ];  vertigo[  ];  seizures[  ];   paresthesias[  ];  difficulty walking[  ];  Psych:depression[  ]; anxiety[  ];  Endocrine: diabetes[  y];  thyroid dysfunction[  ];  Immunizations: Flu [  ]; Pneumococcal[  ];  Other:  Physical Exam: BP 102/64 (BP Location: Left Arm)    Pulse (!) 105    Temp 98.2 F (36.8 C) (Oral)    Resp 20    Ht 6\' 2"  (1.88 m)    Wt (!) 161.7 kg    SpO2 97%    BMI 45.76 kg/m       Physical Exam  General: Young morbidly obese AA male sitting in his hospital room depressed but no acute distress HEENT: Normocephalic pupils equal , dentition with missing and broken teeth right maxilla Neck: Supple without JVD, adenopathy, or bruit Chest: Clear to auscultation, symmetrical breath sounds, no rhonchi, no tenderness             or deformity Cardiovascular: Regular rate and rhythm, no murmur, no gallop, peripheral pulses             palpable in all extremities Abdomen:  Soft, nontender, no palpable mass or organomegaly Extremities:  Warm, well-perfused, no clubbing cyanosis edema or tenderness, venous stasis of the lower extremities currently wrapped in Unna boot                Rectal/GU: Deferred Neuro: Grossly non--focal and symmetrical throughout Skin: Clean and dry without rash or ulceration    Diagnostic Studies & Laboratory data:     Recent Radiology Findings:   DG Chest 2 View  Result Date: 04/10/2021 CLINICAL DATA:  CHF. EXAM: CHEST - 2 VIEW COMPARISON:  April 05, 2021 FINDINGS: Mild, stable, increased interstitial lung markings are seen. Mild linear atelectasis is noted within the suprahilar region on the left. There is no evidence of a pleural effusion or pneumothorax. The cardiac silhouette is mildly enlarged and unchanged in size. The visualized skeletal structures are unremarkable. IMPRESSION: Stable  cardiomegaly and mildly increased interstitial lung markings. While this is likely, in part, chronic in nature, a very mild component of superimposed interstitial edema cannot be excluded. Electronically Signed   By: Virgina Norfolk M.D.   On: 04/10/2021 17:56   CT CHEST WO CONTRAST  Result Date: 04/11/2021 CLINICAL DATA:  VAD evaluation.  Post catheterization. EXAM: CT CHEST WITHOUT CONTRAST TECHNIQUE: Multidetector CT imaging of the chest was performed following the standard protocol without IV contrast. RADIATION DOSE REDUCTION: This exam was performed according to the departmental dose-optimization program which includes automated exposure control, adjustment of the mA and/or kV according to patient size and/or use of iterative reconstruction technique. COMPARISON:  Chest x-ray on 04/10/2021 FINDINGS: Cardiovascular: The heart is enlarged. No pericardial effusion. Minimal coronary artery calcifications. There is normal appearance of the thoracic aorta, not associated significant atherosclerosis or aneurysm. The pulmonary arteries are enlarged, main pulmonary artery 3.3 centimeters. Mediastinum/Nodes: The visualized portion of the thyroid gland has a normal appearance. Subcentimeter mediastinal lymph nodes are present. Assessment for hilar lymph nodes is limited given the lack of intravenous contrast. No axillary adenopathy. Esophagus is normal. Lungs/Pleura: There are diffuse emphysematous changes throughout the lungs. Multiple pulmonary nodules are present. Largest is identified in the LEFT UPPER lobe, measuring 8 millimeters on image 27 of series 4. Largest nodule in the RIGHT lung is 6 millimeters on image 143 of series 4. No focal consolidations or pleural effusions. No pulmonary edema. There is mild bronchial wall thickening and multifocal areas of air trapping. Upper Abdomen: No acute abnormality. Musculoskeletal: No acute abnormality. Remote anterior wedge compression fractures of T8 and T9.  IMPRESSION: 1. Cardiomegaly and minimal coronary artery calcification. 2. Enlarged pulmonary artery consistent with pulmonary arterial hypertension. 3.  Emphysema (ICD10-J43.9). 4. Multiple irregular pulmonary nodules, largest measuring 8 millimeters in the LEFT UPPER lobe. Multiple pulmonary nodules. Most severe: 8 mm left solid pulmonary nodule within the upper lobe. Recommend a non-contrast Chest CT at 3-6 months, then another non-contrast Chest CT at 18-24 months. These guidelines do not apply to immunocompromised patients and patients with cancer. Follow up in patients with significant comorbidities as clinically warranted. For lung cancer screening, adhere to Lung-RADS guidelines. Reference: Radiology. 2017; 284(1):228-43. 5. Areas of air trapping consistent with small vessel disease. 6. Remote anterior wedge compression fractures of T8 and T9. Electronically Signed   By: Nolon Nations M.D.   On: 04/11/2021 10:49   CARDIAC CATHETERIZATION  Result Date: 04/10/2021   The left ventricular ejection fraction is less than 25% by visual estimate. Findings: On milrinone 0.5 mcg/kg/min Ao =87/63 (73) LV = 105/23 RA =  15 RV = 55/22 PA = 59/28 (40) PCW = 24  Fick cardiac output/index = 6.6/2.4 PVR = 2.4 FA sat = 98% PA sat = 55%, 56% PAPi = 2.2 RA/PCW = 0.625 Assessment: 1. Normal coronary arteries (AV groove LCx not seen completely but appears grossly normal) 2. Severe NICM EF < 20 3. Elevated biventricular pressures with normal cardiac output on high-dose milrinone Plan/Discussion: RV indices marginal for VAD in setting of high-dose milrinone. Continue diuresis. Will d/w VAD team. Glori Bickers, MD 11:56 AM     Recent Lab Findings: Lab Results  Component Value Date   WBC 6.0 04/11/2021   HGB 11.3 (L) 04/11/2021   HCT 34.1 (L) 04/11/2021   PLT 256 04/11/2021   GLUCOSE 165 (H) 04/11/2021   CHOL 131 04/11/2021   TRIG 53 04/11/2021   HDL 23 (L) 04/11/2021   LDLCALC 97 04/11/2021   ALT 22 04/08/2021    AST 21 04/08/2021   NA 131 (L) 04/11/2021   K 3.5 04/11/2021   CL 93 (L) 04/11/2021   CREATININE 1.15 04/11/2021   BUN 27 (H) 04/11/2021   CO2 27 04/11/2021   TSH 1.009 10/27/2016   INR 1.6 (H) 04/05/2021   HGBA1C 8.8 (H) 04/05/2021      Assessment / Plan:   Nonischemic cardiomyopathy, inotrope dependent Morbid obesity weight 350 pounds Severe LV dysfunction Moderate to severe RV dysfunction with moderate to severe TR, ascites Spiculated left upper lobe pulmonary nodule 8 mm  Patient is a poor surgical candidate for implantable VAD with tricuspid repair.  His extreme obesity would place him at high risk for postoperative pulmonary complications which would subsequently aggravate his RV dysfunction.  He should lose considerable amount of weight-less than 300 pounds before being considered for implantable VAD.   Tharon Aquas trigt MD

## 2021-04-11 NOTE — Progress Notes (Signed)
HD#6 SUBJECTIVE:  Patient Summary: Jonathan [name] is a 36 yo male with nonischemic cardiomyopathy, end-stage heart failure on a milrinone drip at home, type 2 diabetes, and obesity who presents to [facility] with dyspnea on exertion and difficulty moving over the last few days. Found to be in decompensated HF. Patient's inotrope was continued and he was diuresed with improvement in his symptoms. Found to have Bcx + 2/4 bottles with E faecalis, thought to be d/t PICC line colonization. He was started on IV antibx and transitioned to PO linezolid.   Overnight Events: L/RHC    Interm History: Resting, denies any new complaints. Eating and drinking without difficulty. Having bowel movements and continues to feel like his swelling is decreasing.   OBJECTIVE:  Vital Signs: Vitals:   04/11/21 0453 04/11/21 0812 04/11/21 0925 04/11/21 1138  BP:  100/72  102/64  Pulse:  (!) 102 (!) 105 (!) 105  Resp:  20  20  Temp:  98.1 F (36.7 C)  98.2 F (36.8 C)  TempSrc:  Oral  Oral  SpO2:  96%  97%  Weight: (!) 161.7 kg     Height:       SpO2: 97 % O2 Flow Rate (L/min): 2 L/min  Filed Weights   04/09/21 0147 04/10/21 0435 04/11/21 0453  Weight: (!) 159.7 kg (!) 162.4 kg (!) 161.7 kg     Intake/Output Summary (Last 24 hours) at 04/11/2021 1319 Last data filed at 04/11/2021 1204 Gross per 24 hour  Intake 1255.73 ml  Output 4050 ml  Net -2794.27 ml    Net IO Since Admission: -21,589.65 mL [04/11/21 1319]  Physical Exam:  General: alert and oriented, no acute distress Neck: supple Cardiac: tachycardic, regular rhythm.  Lungs: Clear to auscultation, no wheeze, rales, or rhnonchi, on room air  Abd: soft, NT, not distended, + BS Extremities: 1+ edema to upper shin , wraps in place. Bandage in place right wrist. PICC left arm.  Neuro: alert and oriented, moves all extremities Psych: mood and affect appropriate.   Patient Lines/Drains/Airways Status     Active Line/Drains/Airways      Name Placement date Placement time Site Days   PICC Double Lumen 10/28/20 PICC Right Cephalic 39 cm 0 cm [id number]  1851  -- 159            Pertinent Labs: CBC Latest Ref Rng & Units 04/11/2021 04/10/2021 04/09/2021  WBC 4.0 - 10.5 K/uL 6.0 8.7 8.9  Hemoglobin 13.0 - 17.0 g/dL 11.3(L) 9.8(L) 10.1(L)  Hematocrit 39.0 - 52.0 % 34.1(L) 30.3(L) 30.2(L)  Platelets 150 - 400 K/uL 256 281 296    CMP Latest Ref Rng & Units 04/11/2021 04/10/2021 04/09/2021  Glucose 70 - 99 mg/dL 165(H) 205(H) 165(H)  BUN 6 - 20 mg/dL 27(H) 26(H) 22(H)  Creatinine 0.61 - 1.24 mg/dL 1.15 1.17 1.10  Sodium 135 - 145 mmol/L 131(L) 132(L) 132(L)  Potassium 3.5 - 5.1 mmol/L 3.5 3.9 3.5  Chloride 98 - 111 mmol/L 93(L) 93(L) 91(L)  CO2 22 - 32 mmol/L 27 29 31   Calcium 8.9 - 10.3 mg/dL 8.5(L) 9.0 9.0  Total Protein 6.5 - 8.1 g/dL - - -  Total Bilirubin 0.3 - 1.2 mg/dL - - -  Alkaline Phos 38 - 126 U/L - - -  AST 15 - 41 U/L - - -  ALT 0 - 44 U/L - - -    Recent Labs    04/10/21 2130 04/11/21 0616 04/11/21 1135  GLUCAP 168* 159*  172*      Pertinent Imaging: DG Chest 2 View  Result Date: 04/10/2021 CLINICAL DATA:  CHF. EXAM: CHEST - 2 VIEW COMPARISON:  April 05, 2021 FINDINGS: Mild, stable, increased interstitial lung markings are seen. Mild linear atelectasis is noted within the suprahilar region on the left. There is no evidence of a pleural effusion or pneumothorax. The cardiac silhouette is mildly enlarged and unchanged in size. The visualized skeletal structures are unremarkable. IMPRESSION: Stable cardiomegaly and mildly increased interstitial lung markings. While this is likely, in part, chronic in nature, a very mild component of superimposed interstitial edema cannot be excluded. Electronically Signed   By: Virgina Norfolk M.D.   On: 04/10/2021 17:56   CT CHEST WO CONTRAST  Result Date: 04/11/2021 CLINICAL DATA:  VAD evaluation.  Post catheterization. EXAM: CT CHEST WITHOUT CONTRAST TECHNIQUE:  Multidetector CT imaging of the chest was performed following the standard protocol without IV contrast. RADIATION DOSE REDUCTION: This exam was performed according to the departmental dose-optimization program which includes automated exposure control, adjustment of the mA and/or kV according to patient size and/or use of iterative reconstruction technique. COMPARISON:  Chest x-ray on 04/10/2021 FINDINGS: Cardiovascular: The heart is enlarged. No pericardial effusion. Minimal coronary artery calcifications. There is normal appearance of the thoracic aorta, not associated significant atherosclerosis or aneurysm. The pulmonary arteries are enlarged, main pulmonary artery 3.3 centimeters. Mediastinum/Nodes: The visualized portion of the thyroid gland has a normal appearance. Subcentimeter mediastinal lymph nodes are present. Assessment for hilar lymph nodes is limited given the lack of intravenous contrast. No axillary adenopathy. Esophagus is normal. Lungs/Pleura: There are diffuse emphysematous changes throughout the lungs. Multiple pulmonary nodules are present. Largest is identified in the LEFT UPPER lobe, measuring 8 millimeters on image 27 of series 4. Largest nodule in the RIGHT lung is 6 millimeters on image 143 of series 4. No focal consolidations or pleural effusions. No pulmonary edema. There is mild bronchial wall thickening and multifocal areas of air trapping. Upper Abdomen: No acute abnormality. Musculoskeletal: No acute abnormality. Remote anterior wedge compression fractures of T8 and T9. IMPRESSION: 1. Cardiomegaly and minimal coronary artery calcification. 2. Enlarged pulmonary artery consistent with pulmonary arterial hypertension. 3.  Emphysema (ICD10-J43.9). 4. Multiple irregular pulmonary nodules, largest measuring 8 millimeters in the LEFT UPPER lobe. Multiple pulmonary nodules. Most severe: 8 mm left solid pulmonary nodule within the upper lobe. Recommend a non-contrast Chest CT at 3-6 months,  then another non-contrast Chest CT at 18-24 months. These guidelines do not apply to immunocompromised patients and patients with cancer. Follow up in patients with significant comorbidities as clinically warranted. For lung cancer screening, adhere to Lung-RADS guidelines. Reference: Radiology. 2017; 284(1):228-43. 5. Areas of air trapping consistent with small vessel disease. 6. Remote anterior wedge compression fractures of T8 and T9. Electronically Signed   By: Nolon Nations M.D.   On: 04/11/2021 10:49    ASSESSMENT/PLAN:  Assessment: Active Problems:   Diabetes mellitus type 2 in obese [facility])   Essential hypertension   Acute exacerbation of CHF (congestive heart failure) (HCC)   Hyponatremia with excess extracellular fluid volume   Positive blood culture  # acute on chronic end stage heart failure with reduced EF Noted to be approximately 15 lbs over dry weight. Approx 3.5L UOP yesterday. Volume is still up. He appears to be making improvements daily.  - Heart failure is following. PICC line has been placed. Patient is receiving milrinone, Digoxin 0.125mg , Bidil TID, lasix 80 mg Q4hr Spiro 25mg  daily.  S/p metolazone x1 2/24.  - AKI has resolved and he is on entresto 24/26 BID - Unable to start patient on SGLT2 due to candidasis. BB contraindicated due to low output.  - Patient is nearing euvolemia but will be continued on IV diuresis today. Underwent R/L heart cath which showed elevated ventricular pressures, wedge pressures.  - will continue I+Os, daily weights  Possible Bacteremia  +Blood Cultures Blood cultures positive for E faecalis and staph epidermidis in 2/4 blood cultures which were drawn through is PICC line on admission. 2nd set of Blood cultures collected 2/19 NGTD and Bcx from 2/20 (4/4) no growth to date. He underwent TEE which showed no vegetation concern for endocarditis.  - Ampicillin and ceftriaxone have been discontinued. He is now on oral linezolid 2 weeks. ID  following.   # Hx atrial flutter - Patient is currently in sinus tachycardia. He is on 100mg  amio daily as well as Eliquis.    # AKI - Resolved with diuresis  Hyperkalemia - due to aggressive diuresis. Repleted. Will need to continue to monitor,.   Type 2 diabetes Improved on Farxiga, Trulicity, lantus [id number] BID + 10u TID w/ meals + SSI. Continue CBG checks.   Rick Duff, MD PGY-2 Internal Medicine  Pager [phone]  1:19 PM, 04/11/2021   Please contact the on call pager after 5 pm and on weekends at [phone].

## 2021-04-11 NOTE — Progress Notes (Signed)
Patient taken to CT.

## 2021-04-11 NOTE — Plan of Care (Signed)

## 2021-04-11 NOTE — Progress Notes (Signed)
Advanced Heart Failure Rounding Note  PCP-Cardiologist: Armanda Magic, MD  AHF: Dr. Gala Romney   Subjective:    Continues on milrinone 0.5. Co-ox 61%  Remains on IV lasix. Good diuresis. Weight down 2 pounds  CVP 15  Denies CP, SOB, orthopnea or PND.   Has seen VAD coordinators and Palliative Care team. Interested in VAD if it is option for him.    Objective:   Weight Range: (!) 161.7 kg Body mass index is 45.76 kg/m.   Vital Signs:   Temp:  [97.7 F (36.5 C)-98.5 F (36.9 C)] 98.2 F (36.8 C) (02/25 1138) Pulse Rate:  [102-105] 105 (02/25 1138) Resp:  [18-20] 20 (02/25 1138) BP: (100-133)/(50-92) 102/64 (02/25 1138) SpO2:  [96 %-99 %] 97 % (02/25 1138) Weight:  [161.7 kg] 161.7 kg (02/25 0453) Last BM Date : 04/09/21  Weight change: Filed Weights   04/09/21 0147 04/10/21 0435 04/11/21 0453  Weight: (!) 159.7 kg (!) 162.4 kg (!) 161.7 kg    Intake/Output:   Intake/Output Summary (Last 24 hours) at 04/11/2021 1543 Last data filed at 04/11/2021 1438 Gross per 24 hour  Intake 1495.73 ml  Output 4050 ml  Net -2554.27 ml       Physical Exam    General:  Sitting up on side of bed  No resp difficulty HEENT: normal Neck: supple.JVP elevated to jaw. Carotids 2+ bilat; no bruits. No lymphadenopathy or thryomegaly appreciated. WUG:QBVQX regular  Lungs: clear Abdomen: obese soft, nontender, nondistended. No hepatosplenomegaly. No bruits or masses. Good bowel sounds. Extremities: no cyanosis, clubbing, rash, 1-2+ edema + wrapped Neuro: alert & orientedx3, cranial nerves grossly intact. moves all 4 extremities w/o difficulty. Affect pleasant  Telemetry   Sinus tach 100-110 Personally reviewed   Labs    CBC Recent Labs    04/10/21 0435 04/11/21 0500  WBC 8.7 6.0  HGB 9.8* 11.3*  HCT 30.3* 34.1*  MCV 92.7 91.4  PLT 281 256    Basic Metabolic Panel Recent Labs    45/03/88 0435 04/10/21 2020 04/11/21 0500  NA 132*  --  131*  K 3.9  --  3.5  CL  93*  --  93*  CO2 29  --  27  GLUCOSE 205*  --  165*  BUN 26*  --  27*  CREATININE 1.17  --  1.15  CALCIUM 9.0  --  8.5*  MG 1.9 1.9  --     Liver Function Tests No results for input(s): AST, ALT, ALKPHOS, BILITOT, PROT, ALBUMIN in the last 72 hours.  No results for input(s): LIPASE, AMYLASE in the last 72 hours. Cardiac Enzymes No results for input(s): CKTOTAL, CKMB, CKMBINDEX, TROPONINI in the last 72 hours.  BNP: BNP (last 3 results) Recent Labs    10/28/20 1315 03/12/21 1505 04/05/21 0148  BNP 1,547.7* 448.3* 460.5*     ProBNP (last 3 results) No results for input(s): PROBNP in the last 8760 hours.   D-Dimer No results for input(s): DDIMER in the last 72 hours. Hemoglobin A1C No results for input(s): HGBA1C in the last 72 hours.  Fasting Lipid Panel Recent Labs    04/11/21 0500  CHOL 131  HDL 23*  LDLCALC 97  TRIG 53  CHOLHDL 5.7   Thyroid Function Tests No results for input(s): TSH, T4TOTAL, T3FREE, THYROIDAB in the last 72 hours.  Invalid input(s): FREET3  Other results:   Imaging    DG Chest 2 View  Result Date: 04/10/2021 CLINICAL DATA:  CHF. EXAM: CHEST -  2 VIEW COMPARISON:  April 05, 2021 FINDINGS: Mild, stable, increased interstitial lung markings are seen. Mild linear atelectasis is noted within the suprahilar region on the left. There is no evidence of a pleural effusion or pneumothorax. The cardiac silhouette is mildly enlarged and unchanged in size. The visualized skeletal structures are unremarkable. IMPRESSION: Stable cardiomegaly and mildly increased interstitial lung markings. While this is likely, in part, chronic in nature, a very mild component of superimposed interstitial edema cannot be excluded. Electronically Signed   By: Virgina Norfolk M.D.   On: 04/10/2021 17:56   CT CHEST WO CONTRAST  Result Date: 04/11/2021 CLINICAL DATA:  VAD evaluation.  Post catheterization. EXAM: CT CHEST WITHOUT CONTRAST TECHNIQUE: Multidetector  CT imaging of the chest was performed following the standard protocol without IV contrast. RADIATION DOSE REDUCTION: This exam was performed according to the departmental dose-optimization program which includes automated exposure control, adjustment of the mA and/or kV according to patient size and/or use of iterative reconstruction technique. COMPARISON:  Chest x-ray on 04/10/2021 FINDINGS: Cardiovascular: The heart is enlarged. No pericardial effusion. Minimal coronary artery calcifications. There is normal appearance of the thoracic aorta, not associated significant atherosclerosis or aneurysm. The pulmonary arteries are enlarged, main pulmonary artery 3.3 centimeters. Mediastinum/Nodes: The visualized portion of the thyroid gland has a normal appearance. Subcentimeter mediastinal lymph nodes are present. Assessment for hilar lymph nodes is limited given the lack of intravenous contrast. No axillary adenopathy. Esophagus is normal. Lungs/Pleura: There are diffuse emphysematous changes throughout the lungs. Multiple pulmonary nodules are present. Largest is identified in the LEFT UPPER lobe, measuring 8 millimeters on image 27 of series 4. Largest nodule in the RIGHT lung is 6 millimeters on image 143 of series 4. No focal consolidations or pleural effusions. No pulmonary edema. There is mild bronchial wall thickening and multifocal areas of air trapping. Upper Abdomen: No acute abnormality. Musculoskeletal: No acute abnormality. Remote anterior wedge compression fractures of T8 and T9. IMPRESSION: 1. Cardiomegaly and minimal coronary artery calcification. 2. Enlarged pulmonary artery consistent with pulmonary arterial hypertension. 3.  Emphysema (ICD10-J43.9). 4. Multiple irregular pulmonary nodules, largest measuring 8 millimeters in the LEFT UPPER lobe. Multiple pulmonary nodules. Most severe: 8 mm left solid pulmonary nodule within the upper lobe. Recommend a non-contrast Chest CT at 3-6 months, then another  non-contrast Chest CT at 18-24 months. These guidelines do not apply to immunocompromised patients and patients with cancer. Follow up in patients with significant comorbidities as clinically warranted. For lung cancer screening, adhere to Lung-RADS guidelines. Reference: Radiology. 2017; 284(1):228-43. 5. Areas of air trapping consistent with small vessel disease. 6. Remote anterior wedge compression fractures of T8 and T9. Electronically Signed   By: Nolon Nations M.D.   On: 04/11/2021 10:49     Medications:     Scheduled Medications:  amiodarone  100 mg Oral Daily   apixaban  5 mg Oral BID   Chlorhexidine Gluconate Cloth  6 each Topical Daily   digoxin  125 mcg Oral Daily   Dulaglutide  1.5 mg Subcutaneous Q Wed   furosemide  80 mg Intravenous Q4H   insulin aspart  0-15 Units Subcutaneous TID WC   insulin aspart  0-5 Units Subcutaneous QHS   insulin aspart  10 Units Subcutaneous TID WC   insulin glargine-yfgn  20 Units Subcutaneous BID   isosorbide-hydrALAZINE  1 tablet Oral TID   linezolid  600 mg Oral Q12H   multivitamin with minerals  1 tablet Oral Daily   potassium chloride  40 mEq Oral BID   ramelteon  8 mg Oral QHS   sacubitril-valsartan  1 tablet Oral BID   sodium chloride flush  10-40 mL Intracatheter Q12H   sodium chloride flush  3 mL Intravenous Q12H   sodium chloride flush  3 mL Intravenous Q12H   spironolactone  25 mg Oral Daily    Infusions:  sodium chloride Stopped (04/09/21 1100)   sodium chloride     milrinone 0.5 mcg/kg/min (04/11/21 1447)    PRN Medications: sodium chloride, sodium chloride, acetaminophen, guaiFENesin-dextromethorphan, ondansetron (ZOFRAN) IV, senna-docusate, sodium chloride flush, sodium chloride flush, traMADol    Patient Profile     Mr Eddinger is a 36 y.o.with a history of chronic HFrEF, NICM, HTN, asthma, OSA, morbid obesity, and uncontrolled DM.   On home milrinone.  Assessment/Plan   1.  End Stage HFrEF: Nonischemic  cardiomyopathy. Echo back in 2021 EF 30-35 % with moderate RV dysfunction in the setting of recurrent RBBB with significant dyssynchrony.  Echo 10/2020 EF < 20% and moderate RV dysfunction. He has been on home milrinone 0.375 with [facility] following since 9/22.  Not thought to be candidate for advanced therapies with obesity and noncompliance. Echo in 1/23 reviewed, shows EF < 20%, mild LV dilation, severe RV dysfunction with moderate RV enlargement.  He has developed NYHA class IV symptoms with cardiogenic shock, co-ox 52% on admit with elevated lactate.  - On home milrinone 0.375. Increased milrinone to 0.5 mcg/kg/min this admit. Co-ox 61% today  - R/LHC on 2/24: No CAD. Marginal hemodynamics PAPI 2.1 (on milrinone) - Continue IV diuresis. Add metolazone. Consider lasix gtt.  - Continue Entresto 24/26 mg BID - Continue spiro to 25 mg daily. - Continue digoxin 0.125. Dig level 0.4 - Intolerant SGLT2i due to candidiasis - Continue Bidil 1 tablet tid, BP stable. - No beta blocker d/t low output - Not candidate for transplant currently due to size (BMI 44). HgbA1c improved from > 14 to 8.8%.   - RV function marginal on RHC. (PAPI 2.1 on milrinone). VAD team evaluating to determine candidacy. If felt to be potential candidate for VAD, would need to complete IV abx and have negative f/u BC. 2. DM2: Hgb A1c 8.8%.  - No SGLT2i d/t candidiasis 3. AKI on CKD stage 3: Creatinine 2.14 initially, improved to 1.1 with diuresis and inotrope support - Follow closely.  4. Paroxysmal Atrial Flutter: Sinus tach today. - Continue amio to 100 mg daily.  - Hold Eliqus for cath today   5. OSA: Severe OSA AHI 65 - Does not wear CPAP. 6. Obesity: Body mass index is 45.76 kg/m. 7. ID - Bld CX. Enterococcus faecalis and Staph epidermidis bacteremia. ID following. Possible PICC colonization. On ceftriaxone + ampicllin. - BC 02/19 and 02/20 with NGTD - TEE 2/23 negative for endocarditis - Appreciate ID recs.  Off IV abx. Linaeolid x 2 weeks. If he is VAD candidate will need negative bcx off abx prior to proceeding.  8. PVCs - improving, ~1/min on tele  - Keep K > 4.0 Mg > 2.0 - On amiodarone 100 mg daily. For AFL.   Length of Stay: [place], MD  04/11/2021, 3:43 PM  Advanced Heart Failure Team Pager [phone] (M-F; 7a - 5p)  Please contact [facility] for night-coverage after hours (5p -7a ) and weekends on [email or web address]

## 2021-04-12 DIAGNOSIS — R7881 Bacteremia: Secondary | ICD-10-CM | POA: Diagnosis not present

## 2021-04-12 DIAGNOSIS — E669 Obesity, unspecified: Secondary | ICD-10-CM | POA: Diagnosis not present

## 2021-04-12 DIAGNOSIS — N179 Acute kidney failure, unspecified: Secondary | ICD-10-CM | POA: Diagnosis not present

## 2021-04-12 DIAGNOSIS — Z515 Encounter for palliative care: Secondary | ICD-10-CM | POA: Diagnosis not present

## 2021-04-12 DIAGNOSIS — I4892 Unspecified atrial flutter: Secondary | ICD-10-CM | POA: Diagnosis not present

## 2021-04-12 DIAGNOSIS — Z7189 Other specified counseling: Secondary | ICD-10-CM | POA: Diagnosis not present

## 2021-04-12 DIAGNOSIS — E1169 Type 2 diabetes mellitus with other specified complication: Secondary | ICD-10-CM | POA: Diagnosis not present

## 2021-04-12 DIAGNOSIS — I5023 Acute on chronic systolic (congestive) heart failure: Secondary | ICD-10-CM | POA: Diagnosis not present

## 2021-04-12 LAB — GLUCOSE, CAPILLARY
Glucose-Capillary: 161 mg/dL — ABNORMAL HIGH (ref 70–99)
Glucose-Capillary: 172 mg/dL — ABNORMAL HIGH (ref 70–99)
Glucose-Capillary: 176 mg/dL — ABNORMAL HIGH (ref 70–99)
Glucose-Capillary: 170 mg/dL — ABNORMAL HIGH (ref 70–99)

## 2021-04-12 LAB — LUPUS ANTICOAGULANT PANEL
DRVVT: 51.4 s — ABNORMAL HIGH (ref 0.0–47.0)
PTT Lupus Anticoagulant: 43 s (ref 0.0–43.5)

## 2021-04-12 LAB — CBC
HCT: 30.6 % — ABNORMAL LOW (ref 39.0–52.0)
Hemoglobin: 9.8 g/dL — ABNORMAL LOW (ref 13.0–17.0)
MCH: 29.6 pg (ref 26.0–34.0)
MCHC: 32 g/dL (ref 30.0–36.0)
MCV: 92.4 fL (ref 80.0–100.0)
Platelets: 311 10*3/uL (ref 150–400)
RBC: 3.31 MIL/uL — ABNORMAL LOW (ref 4.22–5.81)
RDW: 13.5 % (ref 11.5–15.5)
WBC: 6.7 10*3/uL (ref 4.0–10.5)
nRBC: 0 % (ref 0.0–0.2)

## 2021-04-12 LAB — BASIC METABOLIC PANEL
Anion gap: 9 (ref 5–15)
BUN: 30 mg/dL — ABNORMAL HIGH (ref 6–20)
CO2: 29 mmol/L (ref 22–32)
Calcium: 8.8 mg/dL — ABNORMAL LOW (ref 8.9–10.3)
Chloride: 91 mmol/L — ABNORMAL LOW (ref 98–111)
Creatinine, Ser: 1.21 mg/dL (ref 0.61–1.24)
GFR, Estimated: >60 mL/min (ref >60)
Glucose, Bld: 182 mg/dL — ABNORMAL HIGH (ref 70–99)
Potassium: 3.8 mmol/L (ref 3.5–5.1)
Sodium: 129 mmol/L — ABNORMAL LOW (ref 135–145)

## 2021-04-12 LAB — COOXEMETRY PANEL
Carboxyhemoglobin: 1.6 % — ABNORMAL HIGH (ref 0.5–1.5)
Methemoglobin: 0.7 % (ref 0.0–1.5)
O2 Saturation: 57.9 %
Total hemoglobin: 10.5 g/dL — ABNORMAL LOW (ref 12.0–16.0)

## 2021-04-12 LAB — DRVVT MIX: dRVVT Mix: 43.9 s — ABNORMAL HIGH (ref 0.0–40.4)

## 2021-04-12 LAB — DRVVT CONFIRM: dRVVT Confirm: 0.8 ratio (ref 0.8–1.2)

## 2021-04-12 MED ORDER — POTASSIUM CHLORIDE CRYS ER 20 MEQ PO TBCR
40.0000 meq | EXTENDED_RELEASE_TABLET | Freq: Once | ORAL | Status: AC
  Administered 2021-04-12: 40 meq via ORAL

## 2021-04-12 MED ORDER — METOLAZONE 5 MG PO TABS
5.0000 mg | ORAL_TABLET | Freq: Once | ORAL | Status: AC
  Administered 2021-04-12: 5 mg via ORAL
  Filled 2021-04-12: qty 1

## 2021-04-12 MED ORDER — POTASSIUM CHLORIDE CRYS ER 20 MEQ PO TBCR
40.0000 meq | EXTENDED_RELEASE_TABLET | Freq: Two times a day (BID) | ORAL | Status: DC
  Administered 2021-04-12 – 2021-04-15 (×6): 40 meq via ORAL
  Filled 2021-04-12 (×7): qty 2

## 2021-04-12 NOTE — Progress Notes (Signed)
HD#7 SUBJECTIVE:  Patient Summary: Jonathan [name] is a 36 yo male with nonischemic cardiomyopathy, end-stage heart failure on a milrinone drip at home, type 2 diabetes, and obesity who presents to [facility] with dyspnea on exertion and difficulty moving over the last few days. Found to be in decompensated HF. Patient's inotrope was continued and he was diuresed with improvement in his symptoms. Found to have Bcx + 2/4 bottles with E faecalis, thought to be d/t PICC line colonization. He was started on IV antibx and transitioned to PO linezolid.   Overnight Events: no acute events overnight    Interm History: Resting, denies any new complaints. Denies SOB, no CP. Appetite maintained. Moving bowels normally.   OBJECTIVE:  Vital Signs: Vitals:   04/12/21 0155 04/12/21 0453 04/12/21 0721 04/12/21 1113  BP:  107/67 119/66 112/69  Pulse:      Resp:  20 20 20   Temp:  97.6 F (36.4 C) 98.5 F (36.9 C) 98.3 F (36.8 C)  TempSrc:  Oral Oral Oral  SpO2:  98% 98% 98%  Weight: (!) 159 kg     Height:       SpO2: 98 % O2 Flow Rate (L/min): 2 L/min  Filed Weights   04/10/21 0435 04/11/21 0453 04/12/21 0155  Weight: (!) 162.4 kg (!) 161.7 kg (!) 159 kg     Intake/Output Summary (Last 24 hours) at 04/12/2021 1452 Last data filed at 04/12/2021 1402 Gross per 24 hour  Intake 1897.25 ml  Output 8075 ml  Net -6177.75 ml    Net IO Since Admission: -27,527.4 mL [04/12/21 1452]  Physical Exam:  General: alert and oriented, no acute distress Neck: supple Cardiac: tachycardic, regular rhythm.  Lungs: Clear to auscultation, no wheeze, rales, or rhnonchi, on room air  Abd: soft, NT, not distended, + BS Extremities: 1+ edema to upper shin , wraps in place. Bandage in place right wrist. PICC left arm.  Neuro: alert and oriented, moves all extremities Psych: mood and affect appropriate.   Patient Lines/Drains/Airways Status     Active Line/Drains/Airways     Name Placement date  Placement time Site Days   PICC Double Lumen 10/28/20 PICC Right Cephalic 39 cm 0 cm [id number]  1851  -- 159            Pertinent Labs: CBC Latest Ref Rng & Units 04/12/2021 04/11/2021 04/10/2021  WBC 4.0 - 10.5 K/uL 6.7 6.0 8.7  Hemoglobin 13.0 - 17.0 g/dL 9.8(L) 11.3(L) 9.8(L)  Hematocrit 39.0 - 52.0 % 30.6(L) 34.1(L) 30.3(L)  Platelets 150 - 400 K/uL 311 256 281    CMP Latest Ref Rng & Units 04/12/2021 04/11/2021 04/10/2021  Glucose 70 - 99 mg/dL 182(H) 165(H) 205(H)  BUN 6 - 20 mg/dL 30(H) 27(H) 26(H)  Creatinine 0.61 - 1.24 mg/dL 1.21 1.15 1.17  Sodium 135 - 145 mmol/L 129(L) 131(L) 132(L)  Potassium 3.5 - 5.1 mmol/L 3.8 3.5 3.9  Chloride 98 - 111 mmol/L 91(L) 93(L) 93(L)  CO2 22 - 32 mmol/L 29 27 29   Calcium 8.9 - 10.3 mg/dL 8.8(L) 8.5(L) 9.0  Total Protein 6.5 - 8.1 g/dL - - -  Total Bilirubin 0.3 - 1.2 mg/dL - - -  Alkaline Phos 38 - 126 U/L - - -  AST 15 - 41 U/L - - -  ALT 0 - 44 U/L - - -    Recent Labs    04/11/21 2107 04/12/21 0611 04/12/21 1111  GLUCAP 164* 161* 172*  Pertinent Imaging: No results found.  ASSESSMENT/PLAN:  Assessment: Active Problems:   Diabetes mellitus type 2 in obese [facility])   Essential hypertension   Acute exacerbation of CHF (congestive heart failure) (HCC)   Hyponatremia with excess extracellular fluid volume   Positive blood culture  # acute on chronic end stage heart failure with reduced EF Noted to be approximately 15 lbs over dry weight. Approx 6L UOP yesterday and additional 3.3L today. Volume is still up. He appears to be making improvements daily.  - Heart failure is following. PICC line has been placed. Patient is receiving milrinone, Digoxin 0.125mg , Bidil TID, lasix 80 mg Q4hr Spiro 25mg  daily. He will receive an additional dose of metolazone today. - AKI has resolved and he is on entresto 24/26 BID - Unable to start patient on SGLT2 due to candidasis. BB contraindicated due to low output.  - Patient is nearing  euvolemia but will be continued on IV diuresis today. Underwent R/L heart cath which showed elevated ventricular pressures, wedge pressures. Not felt to be good candidate for advanced therapies including VAD due to weight. - will continue I+Os, daily weights  Possible Bacteremia  +Blood Cultures Blood cultures positive for E faecalis and staph epidermidis in 2/4 blood cultures which were drawn through is PICC line on admission. 2nd set of Blood cultures collected 2/19 NGTD and Bcx from 2/20 (4/4) no growth to date. He underwent TEE which showed no vegetation concern for endocarditis.  - Ampicillin and ceftriaxone have been discontinued. He is now on oral linezolid 2 weeks. ID following.   # Hx atrial flutter - Patient is currently in sinus tachycardia. He is on 100mg  amio daily as well as Eliquis.    # AKI - Resolved with diuresis  Hyponatremia 129. stable- will continue to monitor.  Hypokalemia - due to aggressive diuresis. Repleted. Will need to continue to monitor,.   Type 2 diabetes Improved on Farxiga, Trulicity, lantus [id number] BID + 10u TID w/ meals + SSI. Continue CBG checks.   Delene Ruffini, PGY1 Internal Medicine  Pager [phone]  2:52 PM, 04/12/2021  Please contact the on call pager after 5 pm and on weekends at [phone].

## 2021-04-12 NOTE — Plan of Care (Signed)

## 2021-04-12 NOTE — Progress Notes (Signed)
° °  Palliative Medicine Inpatient Follow Up Note  HPI: Jonathan [name] is a 36 yo male with nonischemic cardiomyopathy, end-stage heart failure on a milrinone drip at home, type 2 diabetes, and obesity who presents to [facility] with dyspnea on exertion and difficulty moving over the last few days.    Palliative care has been asked to get involved for a VAD evaluation.  Today's Discussion (04/12/2021):  *Please note that this is a verbal dictation therefore any spelling or grammatical errors are due to the "[place] [facility]" system interpretation.  Chart reviewed inclusive of vital signs, progress notes, laboratory results, and diagnostic images.   I met with Jonathan [name] and his wife, Jonathan [name] at bedside this late morning. Reviewed that they are awaiting Dr. Lawson Fiscal and Dr. Clayborne Dana final decision on additional interventions such as a VAD for his heart failure.   I shared per my note review it appears more weight will need to be lost on Jonathan [name]'s part prior to additional consideration. I shared that this will truly be to the discretion of the specialists and if it would be safe to proceed or not.    Jonathan [name] expresses the desire to know so that he can further coordinate care with the rest of his family. Reviewed that there will likely be more clarity throughout the day and conversation with specialists.    Created space and opportunity for patient to explore thoughts feelings and fears regarding current medical situation.  Questions and concerns addressed   Palliative Support Provided  Objective Assessment: Vital Signs Vitals:   04/12/21 0721 04/12/21 1113  BP: 119/66 112/69  Pulse:    Resp: 20 20  Temp: 98.5 F (36.9 C) 98.3 F (36.8 C)  SpO2: 98% 98%    Intake/Output Summary (Last 24 hours) at 04/12/2021 1252 Last data filed at 04/12/2021 1241 Gross per 24 hour  Intake 1897.25 ml  Output 8075 ml  Net -6177.75 ml   Last Weight  Most recent update: 04/12/2021  1:56 AM    Weight   159 kg (350 lb 9.6 oz)              Gen: Young African-American male in no acute distress HEENT: moist mucous membranes CV: Irregular rate and regular rhythm PULM: On room air breathing is even and nonlabored ABD: soft/nontender EXT: No edema Neuro: Alert and oriented x3  SUMMARY OF RECOMMENDATIONS   Partial - No intubation   Patient would like to proceed with VAD if offered, has family support through wife, parents, and a sibling who lives locally   Patient motivated to lose > 50-60lbs in the future   Patient and his wife are amenable to following the recommendations of Dr. Haroldine Laws moving forward   Patient and his wife spoke to LVAD coordinator (on 2/24) and understand the benefits and risks associated with this procedure   Goals: Patient would like to travel within the [place] to various states   Ongoing Palliative support  MDM - Moderate  _____________________________________________________________________________________ [place] Team Team Cell Phone: [phone] Please utilize secure chat with additional questions, if there is no response within 30 minutes please call the above phone number  Palliative Medicine Team providers are available by phone from 7am to 7pm daily and can be reached through the team cell phone.  Should this patient require assistance outside of these hours, please call the patient's attending physician.

## 2021-04-12 NOTE — Progress Notes (Signed)
Advanced Heart Failure Rounding Note  PCP-Cardiologist: Fransico Him, MD  AHF: Dr. Haroldine Laws   Subjective:    Continues on milrinone 0.5. Co-ox 61%  Remains on IV lasix. Received metolazone yesterday with almost 6L out.  Weight down 6 pounds. CVP 14 this am   Denies CP, SOB, orthopnea or PND.   Has seen VAD coordinators and Palliative Care team. Interested in VAD if it is option for him.   Saw Dr. Prescott Gum yesterday who felt size was prohibitive   Objective:   Weight Range: (!) 159 kg Body mass index is 45.01 kg/m.   Vital Signs:   Temp:  [97.6 F (36.4 C)-98.5 F (36.9 C)] 98.3 F (36.8 C) (02/26 1113) Pulse Rate:  [108] 108 (02/25 1555) Resp:  [19-20] 20 (02/26 1113) BP: (107-129)/(46-88) 112/69 (02/26 1113) SpO2:  [93 %-98 %] 98 % (02/26 1113) Weight:  [159 kg] 159 kg (02/26 0155) Last BM Date : 04/09/21  Weight change: Filed Weights   04/10/21 0435 04/11/21 0453 04/12/21 0155  Weight: (!) 162.4 kg (!) 161.7 kg (!) 159 kg    Intake/Output:   Intake/Output Summary (Last 24 hours) at 04/12/2021 1330 Last data filed at 04/12/2021 1241 Gross per 24 hour  Intake 1897.25 ml  Output 8075 ml  Net -6177.75 ml       Physical Exam    General:  Lying in bed . No resp difficulty HEENT: normal Neck: supple. JVP jaw Carotids 2+ bilat; no bruits. No lymphadenopathy or thryomegaly appreciated. Cor: PMI nondisplaced. Regular tachy  No rubs, gallops or murmurs. Lungs: clear Abdomen: obese  soft, nontender, nondistended. No hepatosplenomegaly. No bruits or masses. Good bowel sounds. Extremities: no cyanosis, clubbing, rash, tr-1+ edema  (wrapped) Neuro: alert & orientedx3, cranial nerves grossly intact. moves all 4 extremities w/o difficulty. Affect pleasant  Telemetry   Sinus tach 100-110 Personally reviewed   Labs    CBC Recent Labs    04/11/21 0500 04/12/21 0410  WBC 6.0 6.7  HGB 11.3* 9.8*  HCT 34.1* 30.6*  MCV 91.4 92.4  PLT 256 311     Basic Metabolic Panel Recent Labs    04/10/21 0435 04/10/21 2020 04/11/21 0500 04/12/21 0410  NA 132*  --  131* 129*  K 3.9  --  3.5 3.8  CL 93*  --  93* 91*  CO2 29  --  27 29  GLUCOSE 205*  --  165* 182*  BUN 26*  --  27* 30*  CREATININE 1.17  --  1.15 1.21  CALCIUM 9.0  --  8.5* 8.8*  MG 1.9 1.9  --   --     Liver Function Tests No results for input(s): AST, ALT, ALKPHOS, BILITOT, PROT, ALBUMIN in the last 72 hours.  No results for input(s): LIPASE, AMYLASE in the last 72 hours. Cardiac Enzymes No results for input(s): CKTOTAL, CKMB, CKMBINDEX, TROPONINI in the last 72 hours.  BNP: BNP (last 3 results) Recent Labs    10/28/20 1315 03/12/21 1505 04/05/21 0148  BNP 1,547.7* 448.3* 460.5*     ProBNP (last 3 results) No results for input(s): PROBNP in the last 8760 hours.   D-Dimer No results for input(s): DDIMER in the last 72 hours. Hemoglobin A1C No results for input(s): HGBA1C in the last 72 hours.  Fasting Lipid Panel Recent Labs    04/11/21 0500  CHOL 131  HDL 23*  LDLCALC 97  TRIG 53  CHOLHDL 5.7    Thyroid Function Tests No results  for input(s): TSH, T4TOTAL, T3FREE, THYROIDAB in the last 72 hours.  Invalid input(s): FREET3  Other results:   Imaging    No results found.   Medications:     Scheduled Medications:  amiodarone  100 mg Oral Daily   apixaban  5 mg Oral BID   Chlorhexidine Gluconate Cloth  6 each Topical Daily   digoxin  125 mcg Oral Daily   Dulaglutide  1.5 mg Subcutaneous Q Wed   furosemide  80 mg Intravenous Q4H   insulin aspart  0-15 Units Subcutaneous TID WC   insulin aspart  0-5 Units Subcutaneous QHS   insulin aspart  10 Units Subcutaneous TID WC   insulin glargine-yfgn  20 Units Subcutaneous BID   isosorbide-hydrALAZINE  1 tablet Oral TID   linezolid  600 mg Oral Q12H   metolazone  5 mg Oral Once   multivitamin with minerals  1 tablet Oral Daily   potassium chloride  40 mEq Oral BID   potassium  chloride  40 mEq Oral Once   ramelteon  8 mg Oral QHS   sacubitril-valsartan  1 tablet Oral BID   sodium chloride flush  10-40 mL Intracatheter Q12H   sodium chloride flush  3 mL Intravenous Q12H   sodium chloride flush  3 mL Intravenous Q12H   spironolactone  25 mg Oral Daily    Infusions:  sodium chloride Stopped (04/09/21 1100)   sodium chloride     milrinone 0.5 mcg/kg/min (04/12/21 1241)    PRN Medications: sodium chloride, sodium chloride, acetaminophen, guaiFENesin-dextromethorphan, ondansetron (ZOFRAN) IV, senna-docusate, sodium chloride flush, sodium chloride flush, traMADol    Patient Profile     [name] is a 36 y.o.with a history of chronic HFrEF, NICM, HTN, asthma, OSA, morbid obesity, and uncontrolled DM.   On home milrinone.  Assessment/Plan   1.  End Stage HFrEF: Nonischemic cardiomyopathy. Echo back in 2021 EF 30-35 % with moderate RV dysfunction in the setting of recurrent RBBB with significant dyssynchrony.  Echo 10/2020 EF < 20% and moderate RV dysfunction. He has been on home milrinone 0.375 with [facility] following since 9/22.  Not thought to be candidate for advanced therapies with obesity and noncompliance. Echo in 1/23 reviewed, shows EF < 20%, mild LV dilation, severe RV dysfunction with moderate RV enlargement.  He has developed NYHA class IV symptoms with cardiogenic shock, co-ox 52% on admit with elevated lactate.  - On home milrinone 0.375. Increased milrinone to 0.5 mcg/kg/min this admit. Co-ox 58% today  - R/LHC on 2/24: No CAD. Marginal hemodynamics PAPI 2.1 (on milrinone) - Remains volume overloaded. CVP 14 Continue IV diuresis. Repeat metolazone. - Continue Entresto 24/26 mg BID - Continue spiro to 25 mg daily. - Continue digoxin 0.125. Dig level 0.4 - Intolerant SGLT2i due to candidiasis - Continue Bidil 1 tablet tid, BP stable. - No beta blocker d/t low output - Not candidate for transplant currently due to size (BMI 44). HgbA1c  improved from > 14 to 8.8%.   - RV function marginal on RHC. (PAPI 2.1 on milrinone). VAD team evaluating to determine candidacy. If felt to be potential candidate for VAD, would need to complete IV abx and have negative f/u BC. - Seen by Dr. Prescott Gum on 2/25 and size felt to be prohibitive 2. DM2: Hgb A1c 8.8%.  - No SGLT2i d/t candidiasis 3. AKI on CKD stage 3: Creatinine 2.14 initially, improved to 1.1 with diuresis and inotrope support. Stable 1.2 today  - Follow closely.  4. Paroxysmal Atrial Flutter: Sinus tach today. - Continue amio to 100 mg daily.  - Continue Eliquis  5. OSA: Severe OSA AHI 65 - Does not wear CPAP. 6. Obesity: Body mass index is 45.01 kg/m. 7. ID - Bld CX. Enterococcus faecalis and Staph epidermidis bacteremia. ID following. Possible PICC colonization. On ceftriaxone + ampicllin. - BC 02/19 and 02/20 with NGTD - TEE 2/23 negative for endocarditis - Appreciate ID recs. Off IV abx. Linaeolid x 2 weeks. If he is VAD candidate will need negative bcx off abx prior to proceeding.  8. PVCs - improving, ~1/min on tele  - Keep K > 4.0 Mg > 2.0 - On amiodarone 100 mg daily. For AFL. 9. Hyponatremia - Na 129. Asymptomatic  - FW restrict  Length of Stay: Watson, MD  04/12/2021, 1:30 PM  Advanced Heart Failure Team Pager [phone] (M-F; 7a - 5p)  Please contact [facility] for night-coverage after hours (5p -7a ) and weekends on [email or web address]

## 2021-04-13 DIAGNOSIS — I4892 Unspecified atrial flutter: Secondary | ICD-10-CM | POA: Diagnosis not present

## 2021-04-13 DIAGNOSIS — E669 Obesity, unspecified: Secondary | ICD-10-CM | POA: Diagnosis not present

## 2021-04-13 DIAGNOSIS — N179 Acute kidney failure, unspecified: Secondary | ICD-10-CM | POA: Diagnosis not present

## 2021-04-13 DIAGNOSIS — I5023 Acute on chronic systolic (congestive) heart failure: Secondary | ICD-10-CM | POA: Diagnosis not present

## 2021-04-13 DIAGNOSIS — R7881 Bacteremia: Secondary | ICD-10-CM | POA: Diagnosis not present

## 2021-04-13 DIAGNOSIS — E1169 Type 2 diabetes mellitus with other specified complication: Secondary | ICD-10-CM | POA: Diagnosis not present

## 2021-04-13 LAB — GLUCOSE, CAPILLARY
Glucose-Capillary: 187 mg/dL — ABNORMAL HIGH (ref 70–99)
Glucose-Capillary: 136 mg/dL — ABNORMAL HIGH (ref 70–99)
Glucose-Capillary: 190 mg/dL — ABNORMAL HIGH (ref 70–99)
Glucose-Capillary: 171 mg/dL — ABNORMAL HIGH (ref 70–99)

## 2021-04-13 LAB — BASIC METABOLIC PANEL WITH GFR
Anion gap: 9 (ref 5–15)
BUN: 32 mg/dL — ABNORMAL HIGH (ref 6–20)
CO2: 32 mmol/L (ref 22–32)
Calcium: 9.2 mg/dL (ref 8.9–10.3)
Chloride: 91 mmol/L — ABNORMAL LOW (ref 98–111)
Creatinine, Ser: 1.28 mg/dL — ABNORMAL HIGH (ref 0.61–1.24)
GFR, Estimated: >60 mL/min (ref >60)
Glucose, Bld: 175 mg/dL — ABNORMAL HIGH (ref 70–99)
Potassium: 3.8 mmol/L (ref 3.5–5.1)
Sodium: 132 mmol/L — ABNORMAL LOW (ref 135–145)

## 2021-04-13 LAB — CBC
HCT: 32.8 % — ABNORMAL LOW (ref 39.0–52.0)
Hemoglobin: 11 g/dL — ABNORMAL LOW (ref 13.0–17.0)
MCH: 30.3 pg (ref 26.0–34.0)
MCHC: 33.5 g/dL (ref 30.0–36.0)
MCV: 90.4 fL (ref 80.0–100.0)
Platelets: 349 10*3/uL (ref 150–400)
RBC: 3.63 MIL/uL — ABNORMAL LOW (ref 4.22–5.81)
RDW: 13.3 % (ref 11.5–15.5)
WBC: 7.2 10*3/uL (ref 4.0–10.5)
nRBC: 0 % (ref 0.0–0.2)

## 2021-04-13 LAB — MAGNESIUM: Magnesium: 2 mg/dL (ref 1.7–2.4)

## 2021-04-13 LAB — COOXEMETRY PANEL
Carboxyhemoglobin: 1.4 % (ref 0.5–1.5)
Methemoglobin: <0.7 % (ref 0.0–1.5)
O2 Saturation: 61.9 %
Total hemoglobin: 11.4 g/dL — ABNORMAL LOW (ref 12.0–16.0)

## 2021-04-13 MED ORDER — DULAGLUTIDE 1.5 MG/0.5ML ~~LOC~~ SOAJ: 3.0000 mg | SUBCUTANEOUS | Status: DC

## 2021-04-13 MED ORDER — POTASSIUM CHLORIDE CRYS ER 20 MEQ PO TBCR
40.0000 meq | EXTENDED_RELEASE_TABLET | Freq: Once | ORAL | Status: AC
Start: 2021-04-13 — End: 2021-04-13
  Administered 2021-04-13: 40 meq via ORAL
  Filled 2021-04-13: qty 2

## 2021-04-13 NOTE — Progress Notes (Signed)
Mobility Specialist Progress Note:   04/13/21 1200  Mobility  Activity Ambulated with assistance in hallway  Level of Assistance Standby assist, set-up cues, supervision of patient - no hands on  Assistive Device None  Distance Ambulated (ft) 500 ft  Activity Response Tolerated well  $Mobility charge 1 Mobility   Pt agreeable to mobility at this time. No physical assist required throughout. Pt back sitting EOB with all needs met.   Nelta Numbers Acute Re[facility] Phone: [phone] Office Phone: [phone]

## 2021-04-13 NOTE — Progress Notes (Addendum)
Advanced Heart Failure Rounding Note  PCP-Cardiologist: Armanda Magic, MD  AHF: Dr. Gala Romney   Subjective:    Continues on milrinone 0.5. Co-ox 62%  Brisk diuresis yesterday w/ 7.6L in UOP. Overall net negative 31L this admit. Wt down an additional 10 lb. 22 lb total.   CVP down to 8 today   SCr stable 1.21>>1.28 K 3.8  Mg 2.0   Na 132   Breathing improved. Laying flat in bed. No orthopnea/PND. No exertional dyspnea ambulating around his room. Appetitive is good. Ate all of his breakfast. No complaints.   Has seen VAD coordinators and Palliative Care team. Interested in VAD if it is option for him. Evaluated by Dr. Donata Clay yesterday who felt size was prohibitive.    Objective:   Weight Range: (!) 154.3 kg Body mass index is 43.68 kg/m.   Vital Signs:   Temp:  [97.6 F (36.4 C)-98.5 F (36.9 C)] 98.2 F (36.8 C) (02/27 0755) Pulse Rate:  [108-110] 108 (02/27 0755) Resp:  [20] 20 (02/27 0755) BP: (96-136)/(64-93) 112/64 (02/27 0755) SpO2:  [93 %-100 %] 98 % (02/27 0755) Weight:  [154.3 kg] 154.3 kg (02/27 0433) Last BM Date : 04/09/21  Weight change: Filed Weights   04/11/21 0453 04/12/21 0155 04/13/21 0433  Weight: (!) 161.7 kg (!) 159 kg (!) 154.3 kg    Intake/Output:   Intake/Output Summary (Last 24 hours) at 04/13/2021 0759 Last data filed at 04/13/2021 1791 Gross per 24 hour  Intake 1860.24 ml  Output 6350 ml  Net -4489.76 ml      Physical Exam    CVP 8  General:  Well appearing, obese, laying flat in bed. No respiratory difficulty HEENT: normal Neck: supple. Thick neck, JVD 8 cm. Carotids 2+ bilat; no bruits. No lymphadenopathy or thyromegaly appreciated. Cor: PMI nondisplaced. Regular rate & rhythm. No rubs, gallops or murmurs. Lungs: clear Abdomen: soft, nontender, nondistended. No hepatosplenomegaly. No bruits or masses. Good bowel sounds. Extremities: no cyanosis, clubbing, rash, trace b/l LE edema, legs wrapped  Neuro: alert &  oriented x 3, cranial nerves grossly intact. moves all 4 extremities w/o difficulty. Affect pleasant.   Telemetry   Sinus tach 100-110 Personally reviewed  Labs    CBC Recent Labs    04/12/21 0410 04/13/21 0428  WBC 6.7 7.2  HGB 9.8* 11.0*  HCT 30.6* 32.8*  MCV 92.4 90.4  PLT 311 349   Basic Metabolic Panel Recent Labs    50/56/97 2020 04/11/21 0500 04/12/21 0410 04/13/21 0428  NA  --    < > 129* 132*  K  --    < > 3.8 3.8  CL  --    < > 91* 91*  CO2  --    < > 29 32  GLUCOSE  --    < > 182* 175*  BUN  --    < > 30* 32*  CREATININE  --    < > 1.21 1.28*  CALCIUM  --    < > 8.8* 9.2  MG 1.9  --   --  2.0   < > = values in this interval not displayed.   Liver Function Tests No results for input(s): AST, ALT, ALKPHOS, BILITOT, PROT, ALBUMIN in the last 72 hours.  No results for input(s): LIPASE, AMYLASE in the last 72 hours. Cardiac Enzymes No results for input(s): CKTOTAL, CKMB, CKMBINDEX, TROPONINI in the last 72 hours.  BNP: BNP (last 3 results) Recent Labs    10/28/20  1315 03/12/21 1505 04/05/21 0148  BNP 1,547.7* 448.3* 460.5*    ProBNP (last 3 results) No results for input(s): PROBNP in the last 8760 hours.   D-Dimer No results for input(s): DDIMER in the last 72 hours. Hemoglobin A1C No results for input(s): HGBA1C in the last 72 hours.  Fasting Lipid Panel Recent Labs    04/11/21 0500  CHOL 131  HDL 23*  LDLCALC 97  TRIG 53  CHOLHDL 5.7   Thyroid Function Tests No results for input(s): TSH, T4TOTAL, T3FREE, THYROIDAB in the last 72 hours.  Invalid input(s): FREET3  Other results:   Imaging    No results found.   Medications:     Scheduled Medications:  amiodarone  100 mg Oral Daily   apixaban  5 mg Oral BID   Chlorhexidine Gluconate Cloth  6 each Topical Daily   digoxin  125 mcg Oral Daily   Dulaglutide  1.5 mg Subcutaneous Q Wed   furosemide  80 mg Intravenous Q4H   insulin aspart  0-15 Units Subcutaneous TID WC    insulin aspart  0-5 Units Subcutaneous QHS   insulin aspart  10 Units Subcutaneous TID WC   insulin glargine-yfgn  20 Units Subcutaneous BID   isosorbide-hydrALAZINE  1 tablet Oral TID   linezolid  600 mg Oral Q12H   multivitamin with minerals  1 tablet Oral Daily   potassium chloride  40 mEq Oral BID   ramelteon  8 mg Oral QHS   sacubitril-valsartan  1 tablet Oral BID   sodium chloride flush  10-40 mL Intracatheter Q12H   sodium chloride flush  3 mL Intravenous Q12H   sodium chloride flush  3 mL Intravenous Q12H   spironolactone  25 mg Oral Daily    Infusions:  sodium chloride Stopped (04/09/21 1100)   sodium chloride     milrinone 0.5 mcg/kg/min (04/13/21 0635)    PRN Medications: sodium chloride, sodium chloride, acetaminophen, guaiFENesin-dextromethorphan, ondansetron (ZOFRAN) IV, senna-docusate, sodium chloride flush, sodium chloride flush, traMADol    Patient Profile     Jonathan [name] is a 36 y.o.with a history of chronic HFrEF, NICM, HTN, asthma, OSA, morbid obesity, and uncontrolled DM.   On home milrinone.  Assessment/Plan   1.  End Stage HFrEF: Nonischemic cardiomyopathy. Echo back in 2021 EF 30-35 % with moderate RV dysfunction in the setting of recurrent RBBB with significant dyssynchrony.  Echo 10/2020 EF < 20% and moderate RV dysfunction. He has been on home milrinone 0.375 with [facility] following since 9/22.  Not thought to be candidate for advanced therapies with obesity and noncompliance. Echo in 1/23 reviewed, shows EF < 20%, mild LV dilation, severe RV dysfunction with moderate RV enlargement.  He has developed NYHA class IV symptoms with cardiogenic shock, co-ox 52% on admit with elevated lactate.  - On home milrinone 0.375. Increased milrinone to 0.5 mcg/kg/min this admit. Co-ox 62% today  - R/LHC on 2/24: No CAD. Marginal hemodynamics PAPI 2.1 (on milrinone) - Volume status improving. CVP 8. Continue IV diuretics today. No metolazone. Transition to PO  tomorrow.  - Continue Entresto 24/26 mg BID - Continue spiro 25 mg daily. - Continue digoxin 0.125. Dig level 0.4 - Intolerant SGLT2i due to candidiasis - Continue Bidil 1 tablet tid, BP stable. - No beta blocker d/t low output - Not candidate for transplant currently due to size (BMI 44). HgbA1c improved from > 14 to 8.8%.   - RV function marginal on RHC. (PAPI 2.1 on milrinone). VAD team  evaluating to determine candidacy. If felt to be potential candidate for VAD, would need to complete IV abx and have negative f/u BC. - Seen by Dr. Prescott Gum on 2/25 and size felt to be prohibitive 2. DM2: Hgb A1c 8.8%.  - No SGLT2i d/t candidiasis 3. AKI on CKD stage 3: Creatinine 2.14 initially, improved to 1.1 with diuresis and inotrope support. Stable 1.3 today  - Follow closely.  4. Paroxysmal Atrial Flutter: Sinus tach today. - Continue amio to 100 mg daily.  - Continue Eliquis  5. OSA: Severe OSA AHI 65 - Does not wear CPAP. 6. Obesity: Body mass index is 43.68 kg/m. 7. ID - Bld CX. Enterococcus faecalis and Staph epidermidis bacteremia. ID following. Possible PICC colonization. On ceftriaxone + ampicllin. - BC 02/19 and 02/20 with NGTD - TEE 2/23 negative for endocarditis - Appreciate ID recs. Off IV abx. Linaeolid x 2 weeks. If he is VAD candidate will need negative bcx off abx prior to proceeding.  8. PVCs - improving, ~1/min on tele  - Keep K > 4.0 Mg > 2.0 - On amiodarone 100 mg daily. For AFL. 9. Hypervolemic Hyponatremia - improving, 129>>132 today   - Asymptomatic  - FW restrict  Length of Stay: [address], PA-C  04/13/2021, 7:59 AM  Advanced Heart Failure Team Pager [phone] (M-F; 7a - 5p)  Please contact [facility] for night-coverage after hours (5p -7a ) and weekends on [email or web address]  Patient seen and examined with the above-signed Advanced Practice Provider and/or Housestaff. I personally reviewed laboratory data, imaging studies and relevant notes. I  independently examined the patient and formulated the important aspects of the plan. I have edited the note to reflect any of my changes or salient points. I have personally discussed the plan with the patient and/or family.  Remains on milrinone at 0.5. Has diuresed briskly over last 2-3 days with IV lasix ane metolazone. CVP 8. Co-ox ok  Denies CP, SOB, orthopnea or PND.  General:  Sitting up on side of bed No resp difficulty HEENT: normal Neck: supple. JVP 8 Carotids 2+ bilat; no bruits. No lymphadenopathy or thryomegaly appreciated. Cor: PMI laterally displaced. Regular tachy 2/6 TR Lungs: clear Abdomen: obese soft, nontender, nondistended. No hepatosplenomegaly. No bruits or masses. Good bowel sounds. Extremities: no cyanosis, clubbing, rash, tr edema  wrapped Neuro: alert & orientedx3, cranial nerves grossly intact. moves all 4 extremities w/o difficulty. Affect pleasant  Diuresing well. Continue IV lasix one more day. Cut milrinone back to home dose of 0.375. Follow co-ox.   Has been turned down for VAD so plan for return home on IV milrinone. Have discussed possible referral to [facility] for VAD versus waiting another 6-8 weeks here to try to give him time to lose weight and be re-evaluated.   Glori Bickers, MD  10:24 AM

## 2021-04-13 NOTE — Progress Notes (Signed)
HD#8 SUBJECTIVE:  Patient Summary: Jonathan [name] is a 36 yo male with nonischemic cardiomyopathy, end-stage heart failure on a milrinone drip at home, type 2 diabetes, and obesity who presents to [facility] with dyspnea on exertion and difficulty moving over the last few days. Found to be in decompensated HF. Patient's inotrope was continued and he was diuresed with improvement in his symptoms. Found to have Bcx + 2/4 bottles with E faecalis, thought to be d/t PICC line colonization. He was started on IV antibx and transitioned to PO linezolid.   Overnight Events: no acute events overnight    Interm History: Patient seen and evaluated at bedside . Reports he is feeling better. No complaints. No chest pain. No SHOB.   OBJECTIVE:  Vital Signs: Vitals:   04/12/21 2338 04/13/21 0433 04/13/21 0755 04/13/21 1104  BP: 96/75 120/77 112/64 (!) 88/52  Pulse:  (!) 110 (!) 108 (!) 107  Resp:  20 20 18   Temp:  97.6 F (36.4 C) 98.2 F (36.8 C) 98.5 F (36.9 C)  TempSrc:  Oral Oral Oral  SpO2: 94% 98% 98% 97%  Weight:  (!) 154.3 kg    Height:       SpO2: 97 % O2 Flow Rate (L/min): 2 L/min  Filed Weights   04/11/21 0453 04/12/21 0155 04/13/21 0433  Weight: (!) 161.7 kg (!) 159 kg (!) 154.3 kg     Intake/Output Summary (Last 24 hours) at 04/13/2021 1658 Last data filed at 04/13/2021 1538 Gross per 24 hour  Intake 1869.35 ml  Output 4875 ml  Net -3005.65 ml   Net IO Since Admission: -31,783.05 mL [04/13/21 1658]  Physical Exam:  General: alert and oriented, no acute distress Neck: supple Cardiac: tachycardic, regular rhythm.  Lungs: Clear to auscultation, no wheeze, rales, or rhnonchi, on room air  Abd: soft, NT, not distended, + BS Extremities: 1+ edema to upper shin , wraps in place. Bandage in place right wrist. PICC left arm.  Neuro: alert and oriented, moves all extremities Psych: mood and affect appropriate.   Patient Lines/Drains/Airways Status     Active  Line/Drains/Airways     Name Placement date Placement time Site Days   PICC Double Lumen 10/28/20 PICC Right Cephalic 39 cm 0 cm [id number]  1851  -- 159            Pertinent Labs: CBC Latest Ref Rng & Units 04/13/2021 04/12/2021 04/11/2021  WBC 4.0 - 10.5 K/uL 7.2 6.7 6.0  Hemoglobin 13.0 - 17.0 g/dL 11.0(L) 9.8(L) 11.3(L)  Hematocrit 39.0 - 52.0 % 32.8(L) 30.6(L) 34.1(L)  Platelets 150 - 400 K/uL 349 311 256    CMP Latest Ref Rng & Units 04/13/2021 04/12/2021 04/11/2021  Glucose 70 - 99 mg/dL 175(H) 182(H) 165(H)  BUN 6 - 20 mg/dL 32(H) 30(H) 27(H)  Creatinine 0.61 - 1.24 mg/dL 1.28(H) 1.21 1.15  Sodium 135 - 145 mmol/L 132(L) 129(L) 131(L)  Potassium 3.5 - 5.1 mmol/L 3.8 3.8 3.5  Chloride 98 - 111 mmol/L 91(L) 91(L) 93(L)  CO2 22 - 32 mmol/L 32 29 27  Calcium 8.9 - 10.3 mg/dL 9.2 8.8(L) 8.5(L)  Total Protein 6.5 - 8.1 g/dL - - -  Total Bilirubin 0.3 - 1.2 mg/dL - - -  Alkaline Phos 38 - 126 U/L - - -  AST 15 - 41 U/L - - -  ALT 0 - 44 U/L - - -    Recent Labs    04/13/21 0559 04/13/21 1059 04/13/21 1536  GLUCAP 187* 136* 190*     Pertinent Imaging: No results found.  ASSESSMENT/PLAN:  Assessment: Active Problems:   Diabetes mellitus type 2 in obese [facility])   Essential hypertension   Acute exacerbation of CHF (congestive heart failure) (HCC)   Hyponatremia with excess extracellular fluid volume   Positive blood culture  # acute on chronic end stage heart failure with reduced EF Noted to be approximately 15 lbs over dry weight. 7.5L UOP yesterday.  Volume is still up, but he is making improvements daily and is approaching euvolemia. Planned for one more day of diuresis with transition to PO tomorrow. No metolazone today.  - Heart failure is following. PICC line has been placed. Patient is receiving milrinone, Digoxin 0.125mg , Bidil TID, lasix 80 mg Q4hr Spiro 25mg  daily. Patient has been switched back to home milrinone dose of 0.375. - AKI has resolved and he is on  entresto 24/26 BID - Unable to start patient on SGLT2 due to candidasis. BB contraindicated due to low output.  - Patient is nearing euvolemia but will be continued on IV diuresis today. Underwent R/L heart cath which showed elevated ventricular pressures, wedge pressures. Not felt to be good candidate for advanced therapies including VAD due to weight. Possible referral to [facility] for VAD. Alternative options including weight loss and re-evaluation for VAD here. We have increased his Dulaglutide order to 3.0 weekly to help facilitate this weight loss. Patient has been taking his home medication of 1.5 while here in the hospital. Discussed increasing dose with patient. He takes medication every Wednesday. He reports that he has had difficulty filling 3.0mg  Rx as outpatient. Can consider sending this to [place].  - will continue I+Os, daily weights  Possible Bacteremia  +Blood Cultures Blood cultures positive for E faecalis and staph epidermidis in 2/4 blood cultures which were drawn through is PICC line on admission. 2nd set of Blood cultures collected 2/19 NGTD and Bcx from 2/20 (4/4) no growth to date. He underwent TEE which showed no vegetation concern for endocarditis.  - Ampicillin and ceftriaxone have been discontinued. He is now on oral linezolid 2 weeks. ID following.   # Hx atrial flutter - Patient is currently in sinus tachycardia. He is on 100mg  amio daily as well as Eliquis.    # AKI - Resolved with diuresis  Hyponatremia 132. stable- will continue to monitor.  Hypokalemia - due to aggressive diuresis. Repleted. Will need to continue to monitor,.   Type 2 diabetes Improved on Farxiga, Trulicity, lantus [id number] BID + 10u TID w/ meals + SSI. Continue CBG checks.   Delene Ruffini, PGY1 Internal Medicine  Pager [phone]  4:58 PM, 04/13/2021  Please contact the on call pager after 5 pm and on weekends at [phone].

## 2021-04-14 ENCOUNTER — Other Ambulatory Visit (HOSPITAL_COMMUNITY): Payer: Self-pay

## 2021-04-14 DIAGNOSIS — Z515 Encounter for palliative care: Secondary | ICD-10-CM | POA: Diagnosis not present

## 2021-04-14 DIAGNOSIS — E669 Obesity, unspecified: Secondary | ICD-10-CM | POA: Diagnosis not present

## 2021-04-14 DIAGNOSIS — R7881 Bacteremia: Secondary | ICD-10-CM | POA: Diagnosis not present

## 2021-04-14 DIAGNOSIS — I5023 Acute on chronic systolic (congestive) heart failure: Secondary | ICD-10-CM | POA: Diagnosis not present

## 2021-04-14 DIAGNOSIS — Z7189 Other specified counseling: Secondary | ICD-10-CM | POA: Diagnosis not present

## 2021-04-14 DIAGNOSIS — I4892 Unspecified atrial flutter: Secondary | ICD-10-CM | POA: Diagnosis not present

## 2021-04-14 LAB — POCT I-STAT 7, (LYTES, BLD GAS, ICA,H+H)
Acid-Base Excess: 1 mmol/L (ref 0.0–2.0)
Bicarbonate: 25.2 mmol/L (ref 20.0–28.0)
Calcium, Ion: 0.91 mmol/L — ABNORMAL LOW (ref 1.15–1.40)
HCT: 28 % — ABNORMAL LOW (ref 39.0–52.0)
Hemoglobin: 9.5 g/dL — ABNORMAL LOW (ref 13.0–17.0)
O2 Saturation: 98 %
Potassium: 3.2 mmol/L — ABNORMAL LOW (ref 3.5–5.1)
Sodium: 139 mmol/L (ref 135–145)
TCO2: 26 mmol/L (ref 22–32)
pCO2 arterial: 37.9 mmHg (ref 32–48)
pH, Arterial: 7.43 (ref 7.35–7.45)
pO2, Arterial: 104 mmHg (ref 83–108)

## 2021-04-14 LAB — BASIC METABOLIC PANEL
Anion gap: 11 (ref 5–15)
BUN: 31 mg/dL — ABNORMAL HIGH (ref 6–20)
CO2: 28 mmol/L (ref 22–32)
Calcium: 8.4 mg/dL — ABNORMAL LOW (ref 8.9–10.3)
Chloride: 90 mmol/L — ABNORMAL LOW (ref 98–111)
Creatinine, Ser: 1.26 mg/dL — ABNORMAL HIGH (ref 0.61–1.24)
GFR, Estimated: >60 mL/min (ref >60)
Glucose, Bld: 236 mg/dL — ABNORMAL HIGH (ref 70–99)
Potassium: 3.7 mmol/L (ref 3.5–5.1)
Sodium: 129 mmol/L — ABNORMAL LOW (ref 135–145)

## 2021-04-14 LAB — GLUCOSE, CAPILLARY
Glucose-Capillary: 166 mg/dL — ABNORMAL HIGH (ref 70–99)
Glucose-Capillary: 169 mg/dL — ABNORMAL HIGH (ref 70–99)
Glucose-Capillary: 160 mg/dL — ABNORMAL HIGH (ref 70–99)
Glucose-Capillary: 209 mg/dL — ABNORMAL HIGH (ref 70–99)

## 2021-04-14 LAB — POCT I-STAT EG7
Acid-Base Excess: 4 mmol/L — ABNORMAL HIGH (ref 0.0–2.0)
Acid-Base Excess: 5 mmol/L — ABNORMAL HIGH (ref 0.0–2.0)
Acid-Base Excess: 6 mmol/L — ABNORMAL HIGH (ref 0.0–2.0)
Bicarbonate: 29.6 mmol/L — ABNORMAL HIGH (ref 20.0–28.0)
Bicarbonate: 30.4 mmol/L — ABNORMAL HIGH (ref 20.0–28.0)
Bicarbonate: 31.3 mmol/L — ABNORMAL HIGH (ref 20.0–28.0)
Calcium, Ion: 1.08 mmol/L — ABNORMAL LOW (ref 1.15–1.40)
Calcium, Ion: 1.15 mmol/L (ref 1.15–1.40)
Calcium, Ion: 1.2 mmol/L (ref 1.15–1.40)
HCT: 30 % — ABNORMAL LOW (ref 39.0–52.0)
HCT: 31 % — ABNORMAL LOW (ref 39.0–52.0)
HCT: 33 % — ABNORMAL LOW (ref 39.0–52.0)
Hemoglobin: 10.2 g/dL — ABNORMAL LOW (ref 13.0–17.0)
Hemoglobin: 10.5 g/dL — ABNORMAL LOW (ref 13.0–17.0)
Hemoglobin: 11.2 g/dL — ABNORMAL LOW (ref 13.0–17.0)
O2 Saturation: 49 %
O2 Saturation: 55 %
O2 Saturation: 57 %
Potassium: 3.5 mmol/L (ref 3.5–5.1)
Potassium: 3.8 mmol/L (ref 3.5–5.1)
Potassium: 3.9 mmol/L (ref 3.5–5.1)
Sodium: 133 mmol/L — ABNORMAL LOW (ref 135–145)
Sodium: 134 mmol/L — ABNORMAL LOW (ref 135–145)
Sodium: 136 mmol/L (ref 135–145)
TCO2: 31 mmol/L (ref 22–32)
TCO2: 32 mmol/L (ref 22–32)
TCO2: 33 mmol/L — ABNORMAL HIGH (ref 22–32)
pCO2, Ven: 45.8 mmHg (ref 44–60)
pCO2, Ven: 48.4 mmHg (ref 44–60)
pCO2, Ven: 49.1 mmHg (ref 44–60)
pH, Ven: 7.407 (ref 7.25–7.43)
pH, Ven: 7.412 (ref 7.25–7.43)
pH, Ven: 7.418 (ref 7.25–7.43)
pO2, Ven: 27 mmHg — CL (ref 32–45)
pO2, Ven: 29 mmHg — CL (ref 32–45)
pO2, Ven: 30 mmHg — CL (ref 32–45)

## 2021-04-14 LAB — COOXEMETRY PANEL
Carboxyhemoglobin: 1.9 % — ABNORMAL HIGH (ref 0.5–1.5)
Methemoglobin: 0.7 % (ref 0.0–1.5)
O2 Saturation: 70.5 %
Total hemoglobin: 11.9 g/dL — ABNORMAL LOW (ref 12.0–16.0)

## 2021-04-14 LAB — MAGNESIUM: Magnesium: 1.9 mg/dL (ref 1.7–2.4)

## 2021-04-14 MED ORDER — SACUBITRIL-VALSARTAN 24-26 MG PO TABS
1.0000 | ORAL_TABLET | Freq: Two times a day (BID) | ORAL | 0 refills | Status: DC
  Filled 2021-04-14: qty 60, 30d supply, fill #0

## 2021-04-14 MED ORDER — ADULT MULTIVITAMIN W/MINERALS CH
1.0000 | ORAL_TABLET | Freq: Every day | ORAL | 0 refills | Status: DC
  Filled 2021-04-14: qty 30, 30d supply, fill #0

## 2021-04-14 MED ORDER — MAGNESIUM SULFATE 2 GM/50ML IV SOLN
2.0000 g | Freq: Once | INTRAVENOUS | Status: AC
  Administered 2021-04-14: 2 g via INTRAVENOUS
  Filled 2021-04-14: qty 50

## 2021-04-14 MED ORDER — TORSEMIDE 20 MG PO TABS
80.0000 mg | ORAL_TABLET | Freq: Two times a day (BID) | ORAL | Status: DC
  Administered 2021-04-14 – 2021-04-15 (×3): 80 mg via ORAL
  Filled 2021-04-14 (×3): qty 4

## 2021-04-14 MED ORDER — SPIRONOLACTONE 25 MG PO TABS
25.0000 mg | ORAL_TABLET | Freq: Every day | ORAL | 0 refills | Status: DC
  Filled 2021-04-14: qty 30, 30d supply, fill #0

## 2021-04-14 MED ORDER — LINEZOLID 600 MG PO TABS
600.0000 mg | ORAL_TABLET | Freq: Two times a day (BID) | ORAL | 0 refills | Status: AC
  Filled 2021-04-14: qty 18, 9d supply, fill #0

## 2021-04-14 MED ORDER — TORSEMIDE 20 MG PO TABS
80.0000 mg | ORAL_TABLET | Freq: Two times a day (BID) | ORAL | 0 refills | Status: DC
  Filled 2021-04-14 – 2021-04-16 (×3): qty 240, 30d supply, fill #0

## 2021-04-14 MED ORDER — POTASSIUM CHLORIDE CRYS ER 20 MEQ PO TBCR
40.0000 meq | EXTENDED_RELEASE_TABLET | Freq: Two times a day (BID) | ORAL | 0 refills | Status: DC
  Filled 2021-04-14: qty 120, 30d supply, fill #0

## 2021-04-14 MED ORDER — MILRINONE LACTATE IN DEXTROSE 20-5 MG/100ML-% IV SOLN
0.3750 ug/kg/min | INTRAVENOUS | 0 refills | Status: AC
  Filled 2021-04-14: qty 100, fill #0

## 2021-04-14 MED ORDER — TRIAMCINOLONE 0.1 % CREAM:EUCERIN CREAM 1:1
TOPICAL_CREAM | Freq: Two times a day (BID) | CUTANEOUS | Status: DC | PRN
  Administered 2021-04-14: 1 via TOPICAL
  Filled 2021-04-14: qty 1

## 2021-04-14 MED ORDER — TIRZEPATIDE 10 MG/0.5ML ~~LOC~~ SOAJ
10.0000 mg | SUBCUTANEOUS | 0 refills | Status: DC
  Filled 2021-04-14 – 2021-04-29 (×3): qty 2, 28d supply, fill #0

## 2021-04-14 MED ORDER — LORATADINE 10 MG PO TABS
10.0000 mg | ORAL_TABLET | Freq: Every day | ORAL | Status: DC
  Administered 2021-04-14 – 2021-04-15 (×2): 10 mg via ORAL
  Filled 2021-04-14 (×3): qty 1

## 2021-04-14 NOTE — Progress Notes (Signed)
Civil engineer, contracting [facility]) Hospital Liaison: RN note    Notified by Transition of Care Manger of patient/family request for [facility] services at home after discharge. Chart and patient information under review by [facility] physician. Hospice eligibility confirmed.    Writer spoke with  patient and his wife to initiate education related to hospice philosophy, services and team approach to care. Both verbalized understanding of information given.   No DME currently needed.   [facility] aware of the above. Please notify [facility] when patient is ready to leave the unit at discharge. (Call [phone] or [phone] after 5pm.) [facility] information and contact numbers given to  patient's wife.      A Please do not hesitate to call with questions.    Thank you,   Elsie Saas, RN, [facility]      [facility] Liaison   [phone]

## 2021-04-14 NOTE — Progress Notes (Signed)
Mobility Specialist Progress Note:   04/14/21 1120  Mobility  Activity Ambulated independently in hallway  Level of Assistance Independent  Assistive Device None  Distance Ambulated (ft) 500 ft  Activity Response Tolerated well  $Mobility charge 1 Mobility   Pt in good spirits this am. Asx during ambulation, pt back sitting EOB with all needs met.   Nelta Numbers Acute Rehab Phone: [phone] Office Phone: [phone]

## 2021-04-14 NOTE — Progress Notes (Addendum)
HD#9 SUBJECTIVE:  Patient Summary: Jonathan [name] is a 36 yo male with nonischemic cardiomyopathy, end-stage heart failure on a milrinone drip at home, type 2 diabetes, and obesity who presents to [facility] with dyspnea on exertion and difficulty moving over the last few days. Found to be in decompensated HF. Patient's inotrope was continued and he was diuresed with improvement in his symptoms. Found to have Bcx + 2/4 bottles with E faecalis, thought to be d/t PICC line colonization. He was started on IV antibx and transitioned to PO linezolid.   Overnight Events: no acute events overnight    Interm History: Patient seen and evaluated at bedside . Says he is feeling fine. Complains of left upper arm rash from BP cuff.   OBJECTIVE:  Vital Signs: Vitals:   04/14/21 0616 04/14/21 0745 04/14/21 1036 04/14/21 1546  BP: (!) 98/52 118/74 107/75 110/73  Pulse: (!) 106 (!) 108  (!) 104  Resp: 20   18  Temp: 97.6 F (36.4 C) 98.1 F (36.7 C)  98.3 F (36.8 C)  TempSrc: Oral Oral  Oral  SpO2: 98%   98%  Weight: (!) 153 kg     Height:       SpO2: 98 % O2 Flow Rate (L/min): 2 L/min  Filed Weights   04/12/21 0155 04/13/21 0433 04/14/21 0616  Weight: (!) 159 kg (!) 154.3 kg (!) 153 kg     Intake/Output Summary (Last 24 hours) at 04/14/2021 1634 Last data filed at 04/14/2021 1600 Gross per 24 hour  Intake 2102.12 ml  Output 3200 ml  Net -1097.88 ml   Net IO Since Admission: [id number] mL [04/14/21 1634]  Physical Exam:  General: alert and oriented, no acute distress Neck: supple Cardiac: tachycardic, regular rhythm.  Lungs: Clear to auscultation, no wheeze, rales, or rhnonchi, on room air  Abd: soft, NT, not distended, + BS Extremities: 1+ edema to upper shin , wraps in place. Bandage in place right wrist. PICC left arm.  Neuro: alert and oriented, moves all extremities Psych: mood and affect appropriate.  Skin: papular, non-pustular pruritic rash left upper anterior arm.    Patient Lines/Drains/Airways Status     Active Line/Drains/Airways     Name Placement date Placement time Site Days   PICC Double Lumen 10/28/20 PICC Right Cephalic 39 cm 0 cm [id number]  1851  -- 159            Pertinent Labs: CBC Latest Ref Rng & Units 04/13/2021 04/12/2021 04/11/2021  WBC 4.0 - 10.5 K/uL 7.2 6.7 6.0  Hemoglobin 13.0 - 17.0 g/dL 11.0(L) 9.8(L) 11.3(L)  Hematocrit 39.0 - 52.0 % 32.8(L) 30.6(L) 34.1(L)  Platelets 150 - 400 K/uL 349 311 256    CMP Latest Ref Rng & Units 04/14/2021 04/13/2021 04/12/2021  Glucose 70 - 99 mg/dL 236(H) 175(H) 182(H)  BUN 6 - 20 mg/dL 31(H) 32(H) 30(H)  Creatinine 0.61 - 1.24 mg/dL 1.26(H) 1.28(H) 1.21  Sodium 135 - 145 mmol/L 129(L) 132(L) 129(L)  Potassium 3.5 - 5.1 mmol/L 3.7 3.8 3.8  Chloride 98 - 111 mmol/L 90(L) 91(L) 91(L)  CO2 22 - 32 mmol/L 28 32 29  Calcium 8.9 - 10.3 mg/dL 8.4(L) 9.2 8.8(L)  Total Protein 6.5 - 8.1 g/dL - - -  Total Bilirubin 0.3 - 1.2 mg/dL - - -  Alkaline Phos 38 - 126 U/L - - -  AST 15 - 41 U/L - - -  ALT 0 - 44 U/L - - -  Recent Labs    04/14/21 0602 04/14/21 1033 04/14/21 1544  GLUCAP 166* 169* 160*     Pertinent Imaging: No results found.  ASSESSMENT/PLAN:  Assessment: Active Problems:   Diabetes mellitus type 2 in obese [facility])   Essential hypertension   Acute exacerbation of CHF (congestive heart failure) (HCC)   Hyponatremia with excess extracellular fluid volume   Positive blood culture  # acute on chronic end stage heart failure with reduced EF Noted to be approximately 15 lbs over dry weight. 7.5L UOP yesterday.  Volume is still up, but he is making improvements daily and is approaching euvolemia. Planned for one more day of diuresis with transition to PO tomorrow. No metolazone today.  - Heart failure is following. PICC line has been placed. Patient is receiving milrinone, Digoxin 0.125mg , Bidil TID, lasix 80 mg Q4hr Spiro 25mg  daily. Patient has been switched back to home  milrinone dose of 0.375. Will be started on torsemide 80 Bid once discharged. He will also need KDUR to potassium repletion in the setting of aggressive diuresis.  - AKI has resolved and he is on entresto 24/26 BID - Unable to start patient on SGLT2 due to candidasis. BB contraindicated due to low output.  - Patient is euvolemic and has been on PO diuretics. Underwent R/L heart cath which showed elevated ventricular pressures, wedge pressures. Not felt to be good candidate for advanced therapies including VAD due to weight. Possible referral to [facility] for VAD. Alternative options including weight loss and re-evaluation for VAD here. Patient needing to have body weight under 300lbs to be considered for advanced therapies. To help assist with this goal, we have switched patient from Trulicity 1.5 to Tirzepatide 10mg . This medication will be available for him through [facility].  - will continue I+Os, daily weights - patient has been scheduled to follow up with HF clinic 3/9. He will follow up with [facility] on the 9th as well for management of weight.  - patient is stable for discharge, however, [facility], who he was receiving milrinone infusion from, discharged from their service due to long hospital stay. He has been able to organize new milrinone infusion  but this will not be available to him until tomorrow. For this reason, he will be  discharged tomorrow once he has been able to obtain home milrinone.   Morbid Obesity, BM 43.3 - Patient is motivated to lose weight to be considered for an LVAD - Currently on trulicity for DM; discussed switching to tirzepatide if covered by his insurance for maximal weight loss benefit    Bacteremia  2/2 PICC line infection  +Blood Cultures Blood cultures positive for E faecalis and staph epidermidis in 2/4 blood cultures which were drawn through is PICC line on admission. 2nd set of Blood cultures collected 2/19 NGTD and Bcx from 2/20 (4/4) no growth to date. He underwent TEE  which showed no vegetation concern for endocarditis.  - Ampicillin and ceftriaxone have been discontinued. He is now on oral linezolid 2 weeks. ID following. - PICC line exchanged after 48 hour line holiday    # Hx atrial flutter - Patient is currently in sinus tachycardia. He is on 100mg  amio daily as well as Eliquis.    # AKI - Resolved with diuresis  Hyponatremia 132. stable- will continue to monitor.  Hypokalemia - due to aggressive diuresis. Repleted. Will need to continue to monitor,.   Type 2 diabetes Improved on Farxiga, Trulicity, lantus [id number] BID + 10u TID w/ meals + SSI.  Continue CBG checks.  - Trulicity has been discontinued. He will be started on 10mg  Tirzepatide for glucose control and obesity.   Left upper arm rash - papular, non-pustular, limited to left upper anterior arm. Pruritic. - Kenalog cream to affected area.   Delene Ruffini, PGY1 Internal Medicine  Pager [phone]  4:34 PM, 04/14/2021  Please contact the on call pager after 5 pm and on weekends at [phone].

## 2021-04-14 NOTE — Progress Notes (Addendum)
Advanced Heart Failure Rounding Note  PCP-Cardiologist: Armanda Magic, MD  AHF: Dr. Gala Romney   Subjective:   CO-OX stable on milrinone 0.375 mcg.  Co-ox 62%  Continues to diurese with IV lasix. Weight down another 3 pounds. Overall weight down 25 pounds.    Wants to go home. Denies SOB. No complaints.   Objective:   Weight Range: (!) 153 kg Body mass index is 43.31 kg/m.   Vital Signs:   Temp:  [97.6 F (36.4 C)-98.5 F (36.9 C)] 98.1 F (36.7 C) (02/28 0745) Pulse Rate:  [106-108] 108 (02/28 0745) Resp:  [18-20] 20 (02/28 0616) BP: (88-127)/(42-100) 118/74 (02/28 0745) SpO2:  [97 %-98 %] 98 % (02/28 0616) Weight:  [153 kg] 153 kg (02/28 0616) Last BM Date : 04/13/21  Weight change: Filed Weights   04/12/21 0155 04/13/21 0433 04/14/21 0616  Weight: (!) 159 kg (!) 154.3 kg (!) 153 kg    Intake/Output:   Intake/Output Summary (Last 24 hours) at 04/14/2021 0918 Last data filed at 04/14/2021 0351 Gross per 24 hour  Intake 1800.34 ml  Output 3900 ml  Net -2099.66 ml      Physical Exam  CVP 7 General:  Well appearing. No resp difficulty HEENT: normal Neck: supple. no JVD. Carotids 2+ bilat; no bruits. No lymphadenopathy or thryomegaly appreciated. Cor: PMI nondisplaced. Tachy Regular rate & rhythm. No rubs, gallops or murmurs. Lungs: clear Abdomen: soft, nontender, nondistended. No hepatosplenomegaly. No bruits or masses. Good bowel sounds. Extremities: no cyanosis, clubbing, rash, edema. RUE PICC . R and LLE unna boots.  Neuro: alert & orientedx3, cranial nerves grossly intact. moves all 4 extremities w/o difficulty. Affect pleasant    Telemetry   Sinus Tach 100s   Labs    CBC Recent Labs    04/12/21 0410 04/13/21 0428  WBC 6.7 7.2  HGB 9.8* 11.0*  HCT 30.6* 32.8*  MCV 92.4 90.4  PLT 311 349   Basic Metabolic Panel Recent Labs    16/10/96 0428 04/14/21 0350  NA 132* 129*  K 3.8 3.7  CL 91* 90*  CO2 32 28  GLUCOSE 175* 236*  BUN  32* 31*  CREATININE 1.28* 1.26*  CALCIUM 9.2 8.4*  MG 2.0 1.9   Liver Function Tests No results for input(s): AST, ALT, ALKPHOS, BILITOT, PROT, ALBUMIN in the last 72 hours.  No results for input(s): LIPASE, AMYLASE in the last 72 hours. Cardiac Enzymes No results for input(s): CKTOTAL, CKMB, CKMBINDEX, TROPONINI in the last 72 hours.  BNP: BNP (last 3 results) Recent Labs    10/28/20 1315 03/12/21 1505 04/05/21 0148  BNP 1,547.7* 448.3* 460.5*    ProBNP (last 3 results) No results for input(s): PROBNP in the last 8760 hours.   D-Dimer No results for input(s): DDIMER in the last 72 hours. Hemoglobin A1C No results for input(s): HGBA1C in the last 72 hours.  Fasting Lipid Panel No results for input(s): CHOL, HDL, LDLCALC, TRIG, CHOLHDL, LDLDIRECT in the last 72 hours.  Thyroid Function Tests No results for input(s): TSH, T4TOTAL, T3FREE, THYROIDAB in the last 72 hours.  Invalid input(s): FREET3  Other results:   Imaging    No results found.   Medications:     Scheduled Medications:  amiodarone  100 mg Oral Daily   apixaban  5 mg Oral BID   Chlorhexidine Gluconate Cloth  6 each Topical Daily   digoxin  125 mcg Oral Daily   [START ON 04/15/2021] Dulaglutide  3 mg Subcutaneous Q Wed  furosemide  80 mg Intravenous Q4H   insulin aspart  0-15 Units Subcutaneous TID WC   insulin aspart  0-5 Units Subcutaneous QHS   insulin aspart  10 Units Subcutaneous TID WC   insulin glargine-yfgn  20 Units Subcutaneous BID   isosorbide-hydrALAZINE  1 tablet Oral TID   linezolid  600 mg Oral Q12H   loratadine  10 mg Oral Daily   multivitamin with minerals  1 tablet Oral Daily   potassium chloride  40 mEq Oral BID   ramelteon  8 mg Oral QHS   sacubitril-valsartan  1 tablet Oral BID   sodium chloride flush  10-40 mL Intracatheter Q12H   sodium chloride flush  3 mL Intravenous Q12H   sodium chloride flush  3 mL Intravenous Q12H   spironolactone  25 mg Oral Daily     Infusions:  sodium chloride Stopped (04/09/21 1100)   sodium chloride     milrinone 0.375 mcg/kg/min (04/14/21 0810)    PRN Medications: sodium chloride, sodium chloride, acetaminophen, guaiFENesin-dextromethorphan, ondansetron (ZOFRAN) IV, senna-docusate, sodium chloride flush, sodium chloride flush, traMADol    Patient Profile     Mr Search is a 36 y.o.with a history of chronic HFrEF, NICM, HTN, asthma, OSA, morbid obesity, and uncontrolled DM.   On home milrinone.  Assessment/Plan   1.  End Stage HFrEF: Nonischemic cardiomyopathy. Echo back in 2021 EF 30-35 % with moderate RV dysfunction in the setting of recurrent RBBB with significant dyssynchrony.  Echo 10/2020 EF < 20% and moderate RV dysfunction. He has been on home milrinone 0.375 with [facility] following since 9/22.  Not thought to be candidate for advanced therapies with obesity and noncompliance. Echo in 1/23 reviewed, shows EF < 20%, mild LV dilation, severe RV dysfunction with moderate RV enlargement.  He has developed NYHA class IV symptoms with cardiogenic shock, co-ox 52% on admit with elevated lactate.  - On home milrinone 0.375. Increased milrinone to 0.5 mcg/kg/min this admit.  - R/LHC on 2/24: No CAD. Marginal hemodynamics PAPI 2.1 (on milrinone) - CO-OX stable. Continue 0.375 mcg milrinone which is his home dose.  - Volume status stable. CVP 7. Stop IV lasix. Start torsemide 80 mg twice a day. Add KDUR 40 meq twice a day.  - Continue Entresto 24/26 mg BID - Continue spiro 25 mg daily. - Continue digoxin 0.125. Dig level 0.4 - Intolerant SGLT2i due to candidiasis - Continue Bidil 1 tablet tid, BP stable. - No beta blocker d/t low output - Not candidate for transplant currently due to size (BMI 44). HgbA1c improved from > 14 to 8.8%.   - RV function marginal on RHC. (PAPI 2.1 on milrinone). VAD team evaluating to determine candidacy. If felt to be potential candidate for VAD, would need to complete IV  abx and have negative f/u BC. - Seen by Dr. Prescott Gum on 2/25 and size felt to be prohibitive 2. DM2: Hgb A1c 8.8%.  - No SGLT2i d/t candidiasis 3. AKI on CKD stage 3: Creatinine 2.14 initially, improved to 1.1 with diuresis and inotrope support.  Creatinine unchanged 1.3 today.  4. Paroxysmal Atrial Flutter: Sinus tach today. - Continue amio to 100 mg daily.  - Continue Eliquis  5. OSA: Severe OSA AHI 65 - Does not wear CPAP. 6. Obesity: Body mass index is 43.31 kg/m. 7. ID - Bld CX. Enterococcus faecalis and Staph epidermidis bacteremia. ID following. Possible PICC colonization. On ceftriaxone + ampicllin. - BC 02/19 and 02/20 with NGTD - TEE 2/23 negative  for endocarditis - Appreciate ID recs. Off IV abx. Linaeolid x 2 weeks. If he is VAD candidate will need negative bcx off abx prior to proceeding.  8. PVCs - improving, ~1/min on tele  - Keep K > 4.0 Mg > 2.0 - On amiodarone 100 mg daily. For AFL. -Give 2 gram Mag.   9. Hypervolemic Hyponatremia - 129 today   - Asymptomatic  - FW restrict  Can go home today.  Milrinone 0.375 mcg  Torsemide 80 mg twice a day  KDUR 40 twice a day  Entresto 24-26 mg twice a day  Spironolactone 25 mg daily  Bidil 1 tab three times a day  Eliquis 5 mg twice a day  Amio 100 mg daily  Digoxin 0.125 mg daily   Has been turned down for VAD so plan for return home on IV milrinone. Have discussed possible referral to [facility] for VAD versus waiting another 6-8 weeks here to try to give him time to lose weight and be re-evaluated.  Length of Stay: [place], NP  04/14/2021, 9:18 AM  Advanced Heart Failure Team Pager [phone] (M-F; 7a - 5p)  Please contact [facility] for night-coverage after hours (5p -7a ) and weekends on [email or web address]  Patient seen and examined with the above-signed Advanced Practice Provider and/or Housestaff. I personally reviewed laboratory data, imaging studies and relevant notes. I independently examined the patient and  formulated the important aspects of the plan. I have edited the note to reflect any of my changes or salient points. I have personally discussed the plan with the patient and/or family.  Feels good. Denies CP or SOB. No orthopnea or PND.   Co-ox stable on milrinone. CVP 6-7  General:  Well appearing. No resp difficulty HEENT: normal Neck: supple. no JVD. Carotids 2+ bilat; no bruits. No lymphadenopathy or thryomegaly appreciated. Cor: Regular tachy  Lungs: clear Abdomen: obese soft, nontender, nondistended. No hepatosplenomegaly. No bruits or masses. Good bowel sounds. Extremities: no cyanosis, clubbing, rash, edema Neuro: alert & orientedx3, cranial nerves grossly intact. moves all 4 extremities w/o difficulty. Affect pleasant  Ok for d/c today on current regimen. Discussed criteria needed for VAD/transplant. Will see back in Clinic next week. D/w IMTS team at bedside. Appreciate their care.   Total time spent 40 minutes. Over half that time spent discussing above.   Glori Bickers, MD  10:10 AM

## 2021-04-14 NOTE — Progress Notes (Signed)
Civil engineer, contracting [facility]) Hospital Liaison: RN note    Notified by Transition of Care Manger of patient/family request for [facility] services at home after discharge. Chart and patient information under review by [facility] physician. Hospice eligibility pending currently.    A Please do not hesitate to call with questions.    Thank you,   Elsie Saas, RN, [facility]      [facility] Liaison   [phone]

## 2021-04-14 NOTE — Plan of Care (Signed)
°  Problem: Activity: Goal: Capacity to carry out activities will improve Outcome: Progressing   Problem: Clinical Measurements: Goal: Diagnostic test results will improve Outcome: Progressing Goal: Respiratory complications will improve Outcome: Progressing Goal: Cardiovascular complication will be avoided Outcome: Progressing   Problem: Activity: Goal: Risk for activity intolerance will decrease Outcome: Progressing   Problem: Elimination: Goal: Will not experience complications related to urinary retention Outcome: Progressing   Problem: Pain Managment: Goal: General experience of comfort will improve Outcome: Progressing   Problem: Safety: Goal: Ability to remain free from injury will improve Outcome: Progressing

## 2021-04-14 NOTE — Discharge Instructions (Addendum)
Dear Mr Tischner,  Thank you for trusting [place] with your care. We treated you for heart failure.   Please continue to take the following medications:  Milrinone 0.375 mcg  Torsemide 80 mg twice a day  KDUR 40 twice a day  Entresto 24-26 mg twice a day  Spironolactone 25 mg daily  Bidil 1 tab three times a day  Eliquis 5 mg twice a day  Amio 100 mg daily  Digoxin 0.125 mg daily   Please follow up with [facility] March 9th at 10:15am.  Please follow up in the heart failure clinic on 3/9 at 1:30pm.

## 2021-04-14 NOTE — Plan of Care (Signed)
  Problem: Education: Goal: Ability to demonstrate management of disease process will improve Outcome: Progressing Goal: Ability to verbalize understanding of medication therapies will improve Outcome: Progressing   Problem: Education: Goal: Ability to verbalize understanding of medication therapies will improve Outcome: Progressing   

## 2021-04-14 NOTE — TOC CM/SW Note (Addendum)
2:00 pm HF TOC CM contacted [facility], referral pending. Spoke to pt and wife at bedside. Wife states pt will be enrolled in a weight loss program to help him lose weight.  Jonnie Finner RN3 CCM, Heart Failure TOC CM [phone]   1020 am HF TOC CM spoke to [employer] [facility] Coordinator, Pam RN and they will connect to Home Milrinone at 2 pm. States [facility] discharged from service due to long hospital stay. Will call [facility] to see if they can accept for Home Hospice and Home Milrinone. [facility] is reaching out to [facility]. Waiting to hear back on [place] agency. Jonnie Finner RN3 CCM, Heart Failure TOC CM [facility]-waiting call back [facility]-declined

## 2021-04-14 NOTE — Hospital Course (Signed)
Jonathan [name] presented with dyspnea. HF consulted and initiated diuresis. Diuresed well. He showed symptomatic improvement. Did not need supplemental O2. Underwent L/R heart cath once neared euvolemia for possible VAD candidacy. Evaluated by CT surgery, denied VAD given weight. Started on tirzepatide on discharge to assist with weight.    Blood cultures which were intially collected in ED due to leukocytosis were positive. Strep and e faecalis. ID consulted.  He was started o nampicillin and ceftriaxone. Bcx repeated and returned negative. He underwent TEE which showed no vegetations. He was switched to linezolid.

## 2021-04-15 ENCOUNTER — Telehealth: Payer: Self-pay

## 2021-04-15 ENCOUNTER — Other Ambulatory Visit (HOSPITAL_COMMUNITY): Payer: Self-pay

## 2021-04-15 DIAGNOSIS — L304 Erythema intertrigo: Secondary | ICD-10-CM | POA: Diagnosis not present

## 2021-04-15 DIAGNOSIS — M545 Low back pain, unspecified: Secondary | ICD-10-CM | POA: Diagnosis not present

## 2021-04-15 DIAGNOSIS — F419 Anxiety disorder, unspecified: Secondary | ICD-10-CM | POA: Diagnosis not present

## 2021-04-15 DIAGNOSIS — Z9981 Dependence on supplemental oxygen: Secondary | ICD-10-CM | POA: Diagnosis not present

## 2021-04-15 DIAGNOSIS — E1142 Type 2 diabetes mellitus with diabetic polyneuropathy: Secondary | ICD-10-CM | POA: Diagnosis not present

## 2021-04-15 DIAGNOSIS — E669 Obesity, unspecified: Secondary | ICD-10-CM | POA: Diagnosis not present

## 2021-04-15 DIAGNOSIS — Z794 Long term (current) use of insulin: Secondary | ICD-10-CM | POA: Diagnosis not present

## 2021-04-15 DIAGNOSIS — I5022 Chronic systolic (congestive) heart failure: Secondary | ICD-10-CM | POA: Diagnosis not present

## 2021-04-15 DIAGNOSIS — J302 Other seasonal allergic rhinitis: Secondary | ICD-10-CM | POA: Diagnosis not present

## 2021-04-15 DIAGNOSIS — I25119 Atherosclerotic heart disease of native coronary artery with unspecified angina pectoris: Secondary | ICD-10-CM | POA: Diagnosis not present

## 2021-04-15 DIAGNOSIS — I130 Hypertensive heart and chronic kidney disease with heart failure and stage 1 through stage 4 chronic kidney disease, or unspecified chronic kidney disease: Secondary | ICD-10-CM | POA: Diagnosis not present

## 2021-04-15 DIAGNOSIS — G4733 Obstructive sleep apnea (adult) (pediatric): Secondary | ICD-10-CM | POA: Diagnosis not present

## 2021-04-15 DIAGNOSIS — Z515 Encounter for palliative care: Secondary | ICD-10-CM | POA: Diagnosis not present

## 2021-04-15 DIAGNOSIS — M14672 Charcot's joint, left ankle and foot: Secondary | ICD-10-CM | POA: Diagnosis not present

## 2021-04-15 DIAGNOSIS — J45909 Unspecified asthma, uncomplicated: Secondary | ICD-10-CM | POA: Diagnosis not present

## 2021-04-15 DIAGNOSIS — Z7189 Other specified counseling: Secondary | ICD-10-CM | POA: Diagnosis not present

## 2021-04-15 DIAGNOSIS — J9691 Respiratory failure, unspecified with hypoxia: Secondary | ICD-10-CM | POA: Diagnosis not present

## 2021-04-15 DIAGNOSIS — T80212D Local infection due to central venous catheter, subsequent encounter: Secondary | ICD-10-CM | POA: Diagnosis not present

## 2021-04-15 DIAGNOSIS — F32A Depression, unspecified: Secondary | ICD-10-CM | POA: Diagnosis not present

## 2021-04-15 DIAGNOSIS — I4892 Unspecified atrial flutter: Secondary | ICD-10-CM | POA: Diagnosis not present

## 2021-04-15 LAB — COOXEMETRY PANEL
Carboxyhemoglobin: 1.6 % — ABNORMAL HIGH (ref 0.5–1.5)
Methemoglobin: <0.7 % (ref 0.0–1.5)
O2 Saturation: 57.7 %
Total hemoglobin: 12.1 g/dL (ref 12.0–16.0)

## 2021-04-15 LAB — BASIC METABOLIC PANEL
Anion gap: 10 (ref 5–15)
BUN: 36 mg/dL — ABNORMAL HIGH (ref 6–20)
CO2: 29 mmol/L (ref 22–32)
Calcium: 9.3 mg/dL (ref 8.9–10.3)
Chloride: 89 mmol/L — ABNORMAL LOW (ref 98–111)
Creatinine, Ser: 1.42 mg/dL — ABNORMAL HIGH (ref 0.61–1.24)
GFR, Estimated: >60 mL/min (ref >60)
Glucose, Bld: 244 mg/dL — ABNORMAL HIGH (ref 70–99)
Potassium: 3.8 mmol/L (ref 3.5–5.1)
Sodium: 128 mmol/L — ABNORMAL LOW (ref 135–145)

## 2021-04-15 LAB — MAGNESIUM: Magnesium: 2.2 mg/dL (ref 1.7–2.4)

## 2021-04-15 LAB — GLUCOSE, CAPILLARY: Glucose-Capillary: 154 mg/dL — ABNORMAL HIGH (ref 70–99)

## 2021-04-15 MED ORDER — HEPARIN SOD (PORK) LOCK FLUSH 100 UNIT/ML IV SOLN
250.0000 [IU] | INTRAVENOUS | Status: AC | PRN
  Administered 2021-04-15: 250 [IU]
  Filled 2021-04-15: qty 2.5

## 2021-04-15 MED ORDER — POTASSIUM CHLORIDE CRYS ER 20 MEQ PO TBCR
20.0000 meq | EXTENDED_RELEASE_TABLET | Freq: Once | ORAL | Status: AC
  Administered 2021-04-15: 20 meq via ORAL
  Filled 2021-04-15: qty 1

## 2021-04-15 MED ORDER — MUSCLE RUB 10-15 % EX CREA
TOPICAL_CREAM | CUTANEOUS | Status: DC | PRN
  Filled 2021-04-15 (×2): qty 85

## 2021-04-15 NOTE — Telephone Encounter (Signed)
Pa for pa ( TORSEMIDE 20 MG TAB ) was submitted with office notes from ED .Marland Kitchen awaiting approval or denial  ?

## 2021-04-15 NOTE — Discharge Summary (Signed)
Name: Jonathan [name] MRN: [id number] DOB: [birth year] 36 y.o. PCP: [name], MD  Date of Admission: 04/05/2021  1:30 AM Date of Discharge:   04/15/21 Attending Physician: Angelica Pou, MD  Discharge Diagnosis: 1. Acute on chronic end stage heart failure with reduced EF 2. Morbid Obesity, BM 43.3 3. Bacteremia  2/2 PICC line infection ; +Blood Cultures 4. Hx atrial flutter 5. AKI; resolved 6. Hyponatremia; stable 7. Hypokalemia; resolved 8. Type 2 diabetes 9. Left upper arm rash  Discharge Medications: Allergies as of 04/15/2021       Reactions   Hydrocodone    Hives         Medication List     STOP taking these medications    LORazepam 0.5 MG tablet Commonly known as: ATIVAN   metolazone 2.5 MG tablet Commonly known as: ZAROXOLYN   morphine CONCENTRATE 10 mg / 0.5 ml concentrated solution   Trulicity 1.5 BZ/1.6RC Sopn Generic drug: Dulaglutide       TAKE these medications    Accu-Chek Guide test strip Generic drug: glucose blood Check blood sugar 3 times per day   Accu-Chek Softclix Lancets lancets Check blood sugar 3x a day   albuterol 108 (90 Base) MCG/ACT inhaler Commonly known as: VENTOLIN HFA Inhale 2 puffs into the lungs every 4 (four) hours as needed for wheezing or shortness of breath.   amiodarone 200 MG tablet Commonly known as: PACERONE Take 0.5 tablets (100 mg total) by mouth daily.   apixaban 5 MG Tabs tablet Commonly known as: ELIQUIS Take 1 tablet (5 mg total) by mouth 2 (two) times daily.   azelastine 0.1 % nasal spray Commonly known as: ASTELIN Place into both nostrils as needed for rhinitis. Use in each nostril as directed   blood glucose meter kit and supplies Kit Dispense based on patient and insurance preference. Use up to four times daily as directed. (FOR ICD-9 250.00, 250.01). For QAC - HS accuchecks. May switch to any brand.   [employer] Senna 8.6 MG tablet Generic drug: senna Take 1 tablet by mouth daily as  needed for constipation.   cyclobenzaprine 5 MG tablet Commonly known as: FLEXERIL Take 5 mg by mouth every 6 (six) hours as needed for muscle spasms.   digoxin 0.125 MG tablet Commonly known as: LANOXIN TAKE 1 TABLET BY MOUTH EVERY DAY   FreeStyle Libre 2 Sensor Misc Use to check blood sugar at least 6 times a day   gabapentin 300 MG capsule Commonly known as: NEURONTIN Take 300 mg by mouth as needed (pain).   hyoscyamine 0.125 MG SL tablet Commonly known as: LEVSIN SL Place 0.125 mg under the tongue every 4 (four) hours as needed for cramping (secretions).   insulin aspart 100 UNIT/ML injection Commonly known as: novoLOG Inject 20 Units into the skin 3 (three) times daily with meals.   insulin glargine 100 UNIT/ML injection Commonly known as: LANTUS Inject 0.3 mLs (30 Units total) into the skin 2 (two) times daily.   Insulin Syringe-Needle U-100 30G X 1/2" 1 ML Misc Use to inject insulin 4 times a day.   isosorbide-hydrALAZINE 20-37.5 MG tablet Commonly known as: BIDIL Take 1 tablet by mouth 3 (three) times daily.   linezolid 600 MG tablet Commonly known as: ZYVOX Take 1 tablet (600 mg total) by mouth every 12 (twelve) hours for 9 days.   loratadine 10 MG tablet Commonly known as: Claritin Take 1 tablet (10 mg total) by mouth 2 (two) times daily as needed for  allergies (Can use an extra dose during flare ups.).   milrinone 20 MG/100 ML Soln infusion Commonly known as: PRIMACOR Inject 0.0596 mg/min into the vein continuous. What changed:  how much to take how fast to infuse this   nitroGLYCERIN 0.3 MG SL tablet Commonly known as: NITROSTAT Place 0.3 mg under the tongue every 5 (five) minutes as needed for chest pain.   ondansetron 4 MG tablet Commonly known as: ZOFRAN Take 4 mg by mouth daily as needed for nausea or vomiting.   potassium chloride SA 20 MEQ tablet Commonly known as: KLOR-CON M Take 2 tablets (40 mEq total) by mouth 2 (two) times  daily. What changed: See the new instructions.   sacubitril-valsartan 24-26 MG Commonly known as: ENTRESTO Take 1 tablet by mouth 2 (two) times daily.   spironolactone 25 MG tablet Commonly known as: ALDACTONE Take 1 tablet (25 mg total) by mouth daily. What changed: how much to take   tirzepatide 10 MG/0.5ML Pen Commonly known as: MOUNJARO Inject 10 mg into the skin once a week.   torsemide 20 MG tablet Commonly known as: DEMADEX Take 4 tablets (80 mg total) by mouth 2 (two) times daily. What changed: See the new instructions.   traMADol 50 MG tablet Commonly known as: ULTRAM TAKE 1 TABLET BY MOUTH EVERY 6 HOURS AS NEEDED. What changed: reasons to take this               Durable Medical Equipment  (From admission, onward)           Start     Ordered   04/14/21 0937  Heart failure home health orders  (Heart failure home health orders / Face to face)  Once       Comments: Heart Failure Follow-up Care:  Verify follow-up appointments per Patient Discharge Instructions. Confirm transportation arranged. Reconcile home medications with discharge medication list. Remove discontinued medications from use. Assist patient/caregiver to manage medications using pill box. Reinforce low sodium food selection Assessments: Vital signs and oxygen saturation at each visit. Assess home environment for safety concerns, caregiver support and availability of low-sodium foods. Consult Education officer, museum, PT/OT, Dietitian, and CNA based on assessments. Perform comprehensive cardiopulmonary assessment. Notify MD for any change in condition or weight gain of 3 pounds in one day or 5 pounds in one week with symptoms. Daily Weights and Symptom Monitoring: Ensure patient has access to scales. Teach patient/caregiver to weigh daily before breakfast and after voiding using same scale and record.    Teach patient/caregiver to track weight and symptoms and when to notify  Provider. Activity: Develop individualized activity plan with patient/caregiver.  [facility] to provide  Labs every other week to include BMET, Mg, and CBC with Diff. Additional as needed. Should be drawn via PERIPHERAL stick. NOT PICC line.   C5885 Milrinone 0.375  mcg/kg/min X 52 weeks A4221 Supplies for maintenance of drug infusion catheter A4222 Supplies for the external drug infusion per cassette or bag E0781 Ambulatory Infusion pump  Question Answer Comment  Heart Failure Follow-up Care Advanced Heart Failure (AHF) Clinic at [phone]   Obtain the following labs Basic Metabolic Panel   Lab frequency Other see comments   Fax lab results to Other see comments   Diet Low Sodium Heart Healthy   Fluid restrictions: 2000 mL Fluid   Initiate Heart Failure Clinic Diuretic Protocol to be used by [facility] only ( to be ordered by Heart Failure Team Providers Only) No  04/14/21 0936            Disposition and follow-up:   Mr.Raffael Standre was discharged from [facility] in Stable condition.  At the hospital follow up visit please address:  HF- assess volume status, continue long term goals of care discussions  DM2/Obesity- recheck weight assess tirzepatide response, needs to lose 20-30 lbs. Make sure he obtained the medication as planned  Positive blood cultures- recheck PICC line site area for redness  AKI- recheck creatinine  5.  Labs / imaging needed at time of follow-up: bmp  6.  Pending labs/ test needing follow-up: none  Follow-up Appointments:  Follow-up Information     [name], MD Follow up.   Specialty: Internal Medicine Contact information: [address] [place] [address] [phone]         Sueanne Margarita, MD .   Specialty: Cardiology Contact information: [phone] [address] [place] [address] [phone]         Jolaine Artist, MD Follow up on 04/23/2021.   Specialty:  Cardiology Why: 1:30 PM  The Advanced Heart Failure Clinic at [facility] information: [place] [address] [place] Follow up.   Specialty: Hospice and Palliative Medicine Why: home hospice Contact information: [address] [facility] Hospital Course by problem list: 1. Acute on chronic end stage heart failure with reduced EF Came in with shortness of breath. HF was consulted to help with diuresis which patient tolerated well and he showed symptomatic improvement. Underwent L/R heart cath once neared euvolemia for possible VAD candidacy. Evaluated by CT surgery, but was denied VAD given weight. He was started on tirzepatide on discharge to assist with weight loss. He will follow up in the HF clinic on 3/9 an [facility] on the 9th. GDMT optimized though unable to tolerate SGLT2 or BB. He will be discharged home on dose of milrinone 3.75, Digoxin 0.127m, Bidil TID, torsemide 80 BID, Spiro 239mdaily.   2. Morbid Obesity, BM 43.3 Patient to go home on tirzepatide to assist with weight loss  3. Bacteremia  2/2 PICC line infection ; +Blood Cultures Blood cultures positive for E faecalis and staph epidermidis in 2/4 blood cultures which were drawn through is PICC line on admission. 2nd set of Blood cultures collected 2/19 NGTD and Bcx from 2/20 (4/4) no growth to date. He underwent TEE which showed no vegetation concern for endocarditis. Initially started on amp and ceftriaxone then placed on 2 weeks linezolid and PICC line exchanged.  4. Hx atrial flutter Patient continued on 100 amio and eliquis  5. AKI; resolved Nephrotoxic medications avoided, daily BMP obtained.  6. Hyponatremia; stable Mild, stable. Daily bmps obtained.  7. Hypokalemia; resolved Repleted as appropriate during diuresis.  8. Type 2 diabetes Initially on Farxiga, Trulicity, lantus 2047WBID + 10u TID w/ meals. Trulicity will be discontinued and he will start tirzepatide 10.  9. Left upper arm rash Papular, non-pustular, limited to left upper anterior arm. Topical kenalog cream given.   Subjective: Feels good today, ready to go home. Waiting for Pam to call after delivery of milrinone pump. Reemphasized plan for weight loss as outpatient.  Discharge Exam:   BP (!) 112/93 (BP Location: Left Arm)    Pulse (!) 104  Temp 98.6 F (37 C) (Oral)    Resp 20    Ht 6' 2"  (1.88 m)    Wt (!) 153 kg    SpO2 96%    BMI 43.32 kg/m  Discharge exam:  General: obese appearing man alert and oriented, no acute distress Neck: supple Cardiac: tachycardic, regular rhythm.  Lungs: Clear to auscultation, no wheeze, rales, or rhnonchi, on room air  Abd: soft, NT, not distended, + BS Extremities: 1+ edema to upper shin , wraps in place. Bandage in place right wrist. PICC left arm.  Neuro: alert and oriented, moves all extremities Psych: mood and affect appropriate.  Skin: papular, non-pustular pruritic rash left upper anterior arm.   Pertinent Labs, Studies, and Procedures:   DG Chest Portable 1 View  Result Date: 04/05/2021 CLINICAL DATA:  Shortness of breath. EXAM: PORTABLE CHEST 1 VIEW COMPARISON:  10/28/2020. FINDINGS: The heart is enlarged and the mediastinal contour is stable. Interstitial prominence is noted in the mid to lower lung fields bilaterally, unchanged from the prior exam and may be chronic. No consolidation, effusion, or pneumothorax. No acute osseous abnormality. A right PICC line terminates over the anticipated region of the SVC. IMPRESSION: 1. Cardiomegaly. 2. Stable interstitial prominence in the mid to lower lung fields bilaterally, possible chronic atelectasis or scarring. Electronically Signed   By: Brett Fairy M.D.   On: 04/05/2021 02:15   [place] EKG SITE RITE  Result Date: 04/05/2021 If Site Rite image not attached, placement could not be confirmed due to current  cardiac rhythm.   CBC Latest Ref Rng & Units 04/13/2021 04/12/2021 04/11/2021  WBC 4.0 - 10.5 K/uL 7.2 6.7 6.0  Hemoglobin 13.0 - 17.0 g/dL 11.0(L) 9.8(L) 11.3(L)  Hematocrit 39.0 - 52.0 % 32.8(L) 30.6(L) 34.1(L)  Platelets 150 - 400 K/uL 349 311 256    BMP Latest Ref Rng & Units 04/15/2021 04/14/2021 04/13/2021  Glucose 70 - 99 mg/dL 244(H) 236(H) 175(H)  BUN 6 - 20 mg/dL 36(H) 31(H) 32(H)  Creatinine 0.61 - 1.24 mg/dL 1.42(H) 1.26(H) 1.28(H)  BUN/Creat Ratio 9 - 20 - - -  Sodium 135 - 145 mmol/L 128(L) 129(L) 132(L)  Potassium 3.5 - 5.1 mmol/L 3.8 3.7 3.8  Chloride 98 - 111 mmol/L 89(L) 90(L) 91(L)  CO2 22 - 32 mmol/L 29 28 32  Calcium 8.9 - 10.3 mg/dL 9.3 8.4(L) 9.2    Discharge Instructions: Discharge Instructions     (HEART FAILURE PATIENTS) Call MD:  Anytime you have any of the following symptoms: 1) 3 pound weight gain in 24 hours or 5 pounds in 1 week 2) shortness of breath, with or without a dry hacking cough 3) swelling in the hands, feet or stomach 4) if you have to sleep on extra pillows at night in order to breathe.   Complete by: As directed    Call MD for:  difficulty breathing, headache or visual disturbances   Complete by: As directed    Call MD for:  difficulty breathing, headache or visual disturbances   Complete by: As directed    Call MD for:  extreme fatigue   Complete by: As directed    Call MD for:  extreme fatigue   Complete by: As directed    Call MD for:  hives   Complete by: As directed    Call MD for:  persistant dizziness or light-headedness   Complete by: As directed    Call MD for:  persistant nausea and vomiting   Complete by: As directed  Call MD for:  redness, tenderness, or signs of infection (pain, swelling, redness, odor or green/yellow discharge around incision site)   Complete by: As directed    Call MD for:  severe uncontrolled pain   Complete by: As directed    Call MD for:  severe uncontrolled pain   Complete by: As directed    Call MD  for:  temperature >100.4   Complete by: As directed    Diet - low sodium heart healthy   Complete by: As directed    Heart Failure patients record your daily weight using the same scale at the same time of day   Complete by: As directed    Increase activity slowly   Complete by: As directed    Increase activity slowly   Complete by: As directed    Increase activity slowly   Complete by: As directed        Signed: Scarlett Presto, MD 04/15/2021, 3:38 PM   Pager: [phone]

## 2021-04-15 NOTE — Progress Notes (Signed)
Patient c/o R knee pain 6/10 MD notified,MD instruct to give tylenol, 2 tylenol given. Will continue to monitor. ?

## 2021-04-15 NOTE — Telephone Encounter (Signed)
Pa for pt ( MOUNJARO 10 MG/0.5 ML PEN INJECTOR ) came  through on cover my meds . Submitted with office notes and labs .. awaiting approval or denial  ?

## 2021-04-15 NOTE — Progress Notes (Signed)
Mobility Specialist Progress Note: ? ? 04/15/21 1145  ?Mobility  ?Activity Stood at bedside  ?Range of Motion/Exercises Active;Right leg;Left leg  ?Level of Assistance Independent  ?Assistive Device None  ?Activity Response Tolerated poorly  ?$Mobility charge 1 Mobility  ? ?Pt with new significant R knee pain. Pt states pain is from the way he slept. After encouragement, pt agreed to stand at bedside and do BLE exercises. Pt with decreased pain at end of session, given ice pack. RN notified of pain. ? ?Addison Lank ?Acute Rehab ?Phone: [phone] ?Office Phone: [phone] ? ?

## 2021-04-15 NOTE — TOC Transition Note (Addendum)
Transition of Care (TOC) - CM/SW Discharge Note ? ? ?Patient Details  ?Name: Yoshio Bradle ?MRN: [id number] ?Date of Birth: 1985-07-10 ? ?Transition of Care (TOC) CM/SW Contact:  ?Zenon Mayo, RN ?Phone Number: ?04/15/2021, 10:02 AM ? ? ?Clinical Narrative:    ?NCM spoke with Carolynn Sayers with [employer], she states they got patient set up with [facility] yesterday for home hospice with home milrinone.  Pam states she expects a late after noon dc today around 4pm  to get milrinone and everything ready and the script from [facility] so they can get started with the Milrinone.   ? ? ?Final next level of care: [place] ?Barriers to Discharge: No Barriers Identified ? ? ?Patient Goals and CMS Choice ?Patient states their goals for this hospitalization and ongoing recovery are:: return home, wants to get better ?CMS Medicare.gov Compare Post Acute Care list provided to:: Patient ?Choice offered to / list presented to : Patient ? ?Discharge Placement ?  ?           ?  ?  ?  ?  ? ?Discharge Plan and Services ?  ?Discharge Planning Services: CM Consult ?Post Acute Care Choice: Home Health          ?  ?DME Agency: NA ?  ?  ?  ?HH Arranged: RN ?[place] Agency:  Teacher, early years/pre) ?Date HH Agency Contacted: 04/15/21 ?Time [place]: 1001 ?Representative spoke with at [place]: Carolynn Sayers. Maryann with [facility] ? ?Social Determinants of Health (SDOH) Interventions ?  ? ? ?Readmission Risk Interventions ?No flowsheet data found. ? ? ? ? ?

## 2021-04-15 NOTE — Progress Notes (Addendum)
Advanced Heart Failure Rounding Note  PCP-Cardiologist: Fransico Him, MD  AHF: Dr. Haroldine Laws   Subjective:    On milrinone 0.375 mcg.  Co-ox 58% (fairly stable)    Overall weight down 25 pounds w/ IV Lasix. Now back on PO torsemide. Wt stable from yesterday at 337 lb. CVP 7   Scr trending back up slightly, 1.26>>1.42. BP stable. No hypotension.    Na resulted 128 in setting of hyperglycemia, Glu 244. Corrected Na 131.    Feels well. Denies dyspnea. Wants to go home. Waiting for home milrinone set-up.   Objective:   Weight Range: (!) 153 kg Body mass index is 43.32 kg/m.   Vital Signs:   Temp:  [97.6 F (36.4 C)-98.3 F (36.8 C)] 97.6 F (36.4 C) (03/01 0451) Pulse Rate:  [104-108] 108 (02/28 1819) Resp:  [18-22] 20 (03/01 0815) BP: (103-111)/(64-75) 111/70 (03/01 0451) SpO2:  [96 %-98 %] 96 % (03/01 0451) Weight:  [153 kg] 153 kg (03/01 0528) Last BM Date : 04/14/21  Weight change: Filed Weights   04/13/21 0433 04/14/21 0616 04/15/21 0528  Weight: (!) 154.3 kg (!) 153 kg (!) 153 kg    Intake/Output:   Intake/Output Summary (Last 24 hours) at 04/15/2021 0830 Last data filed at 04/15/2021 0528 Gross per 24 hour  Intake 2063.46 ml  Output 3425 ml  Net -1361.54 ml      Physical Exam   CVP 7  General:  Well appearin, sitting up on side of bed. No respiratory difficulty HEENT: normal Neck: supple. Thick neck, JVD 7 cm. Carotids 2+ bilat; no bruits. No lymphadenopathy or thyromegaly appreciated. Cor: PMI nondisplaced. Regular rhythm, mildly tachy rate. No rubs, gallops or murmurs. Lungs: clear Abdomen: soft, nontender, nondistended. No hepatosplenomegaly. No bruits or masses. Good bowel sounds. Extremities: no cyanosis, clubbing, rash, edema + unna boots +RUE PICC  Neuro: alert & oriented x 3, cranial nerves grossly intact. moves all 4 extremities w/o difficulty. Affect pleasant.   Telemetry   Sinus Tach, low 100s, personally reviewed    Labs     CBC Recent Labs    04/13/21 0428  WBC 7.2  HGB 11.0*  HCT 32.8*  MCV 90.4  PLT 0000000   Basic Metabolic Panel Recent Labs    04/14/21 0350 04/15/21 0520  NA 129* 128*  K 3.7 3.8  CL 90* 89*  CO2 28 29  GLUCOSE 236* 244*  BUN 31* 36*  CREATININE 1.26* 1.42*  CALCIUM 8.4* 9.3  MG 1.9 2.2   Liver Function Tests No results for input(s): AST, ALT, ALKPHOS, BILITOT, PROT, ALBUMIN in the last 72 hours.  No results for input(s): LIPASE, AMYLASE in the last 72 hours. Cardiac Enzymes No results for input(s): CKTOTAL, CKMB, CKMBINDEX, TROPONINI in the last 72 hours.  BNP: BNP (last 3 results) Recent Labs    10/28/20 1315 03/12/21 1505 04/05/21 0148  BNP 1,547.7* 448.3* 460.5*    ProBNP (last 3 results) No results for input(s): PROBNP in the last 8760 hours.   D-Dimer No results for input(s): DDIMER in the last 72 hours. Hemoglobin A1C No results for input(s): HGBA1C in the last 72 hours.  Fasting Lipid Panel No results for input(s): CHOL, HDL, LDLCALC, TRIG, CHOLHDL, LDLDIRECT in the last 72 hours.  Thyroid Function Tests No results for input(s): TSH, T4TOTAL, T3FREE, THYROIDAB in the last 72 hours.  Invalid input(s): FREET3  Other results:   Imaging    No results found.   Medications:  Scheduled Medications:  amiodarone  100 mg Oral Daily   apixaban  5 mg Oral BID   Chlorhexidine Gluconate Cloth  6 each Topical Daily   digoxin  125 mcg Oral Daily   insulin aspart  0-15 Units Subcutaneous TID WC   insulin aspart  0-5 Units Subcutaneous QHS   insulin aspart  10 Units Subcutaneous TID WC   insulin glargine-yfgn  20 Units Subcutaneous BID   isosorbide-hydrALAZINE  1 tablet Oral TID   linezolid  600 mg Oral Q12H   loratadine  10 mg Oral Daily   multivitamin with minerals  1 tablet Oral Daily   potassium chloride  40 mEq Oral BID   ramelteon  8 mg Oral QHS   sacubitril-valsartan  1 tablet Oral BID   sodium chloride flush  10-40 mL Intracatheter  Q12H   sodium chloride flush  3 mL Intravenous Q12H   sodium chloride flush  3 mL Intravenous Q12H   spironolactone  25 mg Oral Daily   torsemide  80 mg Oral BID    Infusions:  sodium chloride Stopped (04/09/21 1100)   sodium chloride     milrinone 0.375 mcg/kg/min (04/15/21 0024)    PRN Medications: sodium chloride, sodium chloride, acetaminophen, guaiFENesin-dextromethorphan, Muscle Rub, ondansetron (ZOFRAN) IV, senna-docusate, sodium chloride flush, sodium chloride flush, traMADol, triamcinolone 0.1 % cream : eucerin    Patient Profile     [name] is a 36 y.o.with a history of chronic HFrEF, NICM, HTN, asthma, OSA, morbid obesity, and uncontrolled DM.   On home milrinone.  Assessment/Plan   1.  End Stage HFrEF: Nonischemic cardiomyopathy. Echo back in 2021 EF 30-35 % with moderate RV dysfunction in the setting of recurrent RBBB with significant dyssynchrony.  Echo 10/2020 EF < 20% and moderate RV dysfunction. He has been on home milrinone 0.375 with [facility] following since 9/22.  Not thought to be candidate for advanced therapies with obesity and noncompliance. Echo in 1/23 reviewed, shows EF < 20%, mild LV dilation, severe RV dysfunction with moderate RV enlargement.  He has developed NYHA class IV symptoms with cardiogenic shock, co-ox 52% on admit with elevated lactate.  - On home milrinone 0.375. Increased milrinone to 0.5 mcg/kg/min this admit.  - R/LHC on 2/24: No CAD. Marginal hemodynamics PAPI 2.1 (on milrinone) - CO-OX stable. Continue 0.375 mcg milrinone which is his home dose.  - Volume status stable. CVP 7.  - Continue torsemide 80 mg twice a day. Add KDUR 40 meq twice a day.  - Continue Entresto 24/26 mg BID - Continue spiro 25 mg daily. - Continue digoxin 0.125. Dig level 0.4 - Intolerant SGLT2i due to candidiasis - Continue Bidil 1 tablet tid, BP stable. - No beta blocker d/t low output - Not candidate for transplant currently due to size (BMI 44).  HgbA1c improved from > 14 to 8.8%.   - RV function marginal on RHC. (PAPI 2.1 on milrinone). VAD team evaluating to determine candidacy. If felt to be potential candidate for VAD, would need to complete IV abx and have negative f/u BC. - Seen by Dr. Prescott Gum on 2/25 and size felt to be prohibitive 2. DM2: Hgb A1c 8.8%.  - No SGLT2i d/t candidiasis 3. AKI on CKD stage 3: Creatinine 2.14 initially, improved to 1.1 with diuresis and inotrope support.  Creatinine, up slightly 1.3>>1.4 today.  4. Paroxysmal Atrial Flutter: Sinus tach today. - Continue amio to 100 mg daily.  - Continue Eliquis  5. OSA: Severe OSA AHI  65 - Does not wear CPAP. 6. Obesity: Body mass index is 43.32 kg/m. 7. ID - Bld CX. Enterococcus faecalis and Staph epidermidis bacteremia. ID following. Possible PICC colonization. On ceftriaxone + ampicllin. - BC 02/19 and 02/20 with NGTD - TEE 2/23 negative for endocarditis - Appreciate ID recs. Off IV abx. Linaeolid x 2 weeks. If he is VAD candidate will need negative bcx off abx prior to proceeding.  8. PVCs - improving, ~1/min on tele  - Keep K > 4.0 Mg > 2.0 - On amiodarone 100 mg daily. For AFL. 9. Hyponatremia - 131 today  (corrected for hyperglycemia)  - Asymptomatic  - FW restrict  Can go home today after home IV milrinone infusion set up.   Cardiac Meds for discharge  Milrinone 0.375 mcg /kg/min Torsemide 80 mg twice a day  KDUR 40 twice a day  Entresto 24-26 mg twice a day  Spironolactone 25 mg daily  Bidil 1 tab three times a day  Eliquis 5 mg twice a day  Amio 100 mg daily  Digoxin 0.125 mg daily   Has been turned down for VAD so plan for return home on IV milrinone. Have discussed possible referral to [facility] for VAD versus waiting another 6-8 weeks here to try to give him time to lose weight and be re-evaluated. Will follow closely in the [facility]. Post hospital f/u has been arranged on 3/9. Appt info in AVS.   Length of Stay: [place], PA-C   04/15/2021, 8:30 AM  Advanced Heart Failure Team Pager [phone] (M-F; 7a - 5p)  Please contact [facility] for night-coverage after hours (5p -7a ) and weekends on [email or web address]  Patient seen and examined with the above-signed Advanced Practice Provider and/or Housestaff. I personally reviewed laboratory data, imaging studies and relevant notes. I independently examined the patient and formulated the important aspects of the plan. I have edited the note to reflect any of my changes or salient points. I have personally discussed the plan with the patient and/or family.  Discharge delayed yesterday due to issues with getting home milrinone.   Now able to get it.  Denies CP or SOB.   General: Sitting up on side of bed  No resp difficulty HEENT: normal Neck: supple. no JVD. Carotids 2+ bilat; no bruits. No lymphadenopathy or thryomegaly appreciated. Cor: PMI laterally displaced. Regular tachy No rubs, gallops or murmurs. Lungs: clear Abdomen: soft, nontender, nondistended. No hepatosplenomegaly. No bruits or masses. Good bowel sounds. Extremities: no cyanosis, clubbing, rash, edema Neuro: alert & orientedx3, cranial nerves grossly intact. moves all 4 extremities w/o difficulty. Affect pleasant  He is stable for d/c today. Home inotropes arranged. Will f/u in Clinic.   Glori Bickers, MD  5:00 PM

## 2021-04-16 ENCOUNTER — Telehealth: Payer: Self-pay

## 2021-04-16 ENCOUNTER — Other Ambulatory Visit (HOSPITAL_COMMUNITY): Payer: Self-pay

## 2021-04-16 DIAGNOSIS — J9691 Respiratory failure, unspecified with hypoxia: Secondary | ICD-10-CM | POA: Diagnosis not present

## 2021-04-16 DIAGNOSIS — I130 Hypertensive heart and chronic kidney disease with heart failure and stage 1 through stage 4 chronic kidney disease, or unspecified chronic kidney disease: Secondary | ICD-10-CM | POA: Diagnosis not present

## 2021-04-16 DIAGNOSIS — T80212D Local infection due to central venous catheter, subsequent encounter: Secondary | ICD-10-CM | POA: Diagnosis not present

## 2021-04-16 DIAGNOSIS — G4733 Obstructive sleep apnea (adult) (pediatric): Secondary | ICD-10-CM | POA: Diagnosis not present

## 2021-04-16 DIAGNOSIS — I25119 Atherosclerotic heart disease of native coronary artery with unspecified angina pectoris: Secondary | ICD-10-CM | POA: Diagnosis not present

## 2021-04-16 DIAGNOSIS — F32A Depression, unspecified: Secondary | ICD-10-CM | POA: Diagnosis not present

## 2021-04-16 DIAGNOSIS — I4892 Unspecified atrial flutter: Secondary | ICD-10-CM | POA: Diagnosis not present

## 2021-04-16 DIAGNOSIS — M545 Low back pain, unspecified: Secondary | ICD-10-CM | POA: Diagnosis not present

## 2021-04-16 DIAGNOSIS — E1142 Type 2 diabetes mellitus with diabetic polyneuropathy: Secondary | ICD-10-CM | POA: Diagnosis not present

## 2021-04-16 DIAGNOSIS — Z794 Long term (current) use of insulin: Secondary | ICD-10-CM | POA: Diagnosis not present

## 2021-04-16 DIAGNOSIS — Z9981 Dependence on supplemental oxygen: Secondary | ICD-10-CM | POA: Diagnosis not present

## 2021-04-16 DIAGNOSIS — J45909 Unspecified asthma, uncomplicated: Secondary | ICD-10-CM | POA: Diagnosis not present

## 2021-04-16 DIAGNOSIS — E669 Obesity, unspecified: Secondary | ICD-10-CM | POA: Diagnosis not present

## 2021-04-16 DIAGNOSIS — L304 Erythema intertrigo: Secondary | ICD-10-CM | POA: Diagnosis not present

## 2021-04-16 DIAGNOSIS — M14672 Charcot's joint, left ankle and foot: Secondary | ICD-10-CM | POA: Diagnosis not present

## 2021-04-16 DIAGNOSIS — J302 Other seasonal allergic rhinitis: Secondary | ICD-10-CM | POA: Diagnosis not present

## 2021-04-16 DIAGNOSIS — I5022 Chronic systolic (congestive) heart failure: Secondary | ICD-10-CM | POA: Diagnosis not present

## 2021-04-16 DIAGNOSIS — F419 Anxiety disorder, unspecified: Secondary | ICD-10-CM | POA: Diagnosis not present

## 2021-04-16 LAB — FACTOR 5 LEIDEN

## 2021-04-16 NOTE — Telephone Encounter (Signed)
DECISION: ? ? ? ? ?Approved on March 1 ? ?Request Reference Number: [id number]. ? ? TORSEMIDE TAB 20MG  is approved through 04/16/2022. ? ? For further questions, call 06/16/2022 at [phone]. ? ? ?Drug ?Torsemide 20MG  tablets ? ?Form ? ?[employer] Electronic Prior Authorization Form [id number] NCPDP) ? ? ? ? ( COPY SENT TO THE PHARMACY ALSO )  ? ? ? ? ? ?

## 2021-04-16 NOTE — Telephone Encounter (Signed)
Transition Care Management Unsuccessful Follow-up Telephone Call ? ?Date of discharge and from where:  04/15/2021 from [place] ? ?Attempts:  1st Attempt ? ?Reason for unsuccessful TCM follow-up call:  Left voice message ? ? ? ?

## 2021-04-17 DIAGNOSIS — I25119 Atherosclerotic heart disease of native coronary artery with unspecified angina pectoris: Secondary | ICD-10-CM | POA: Diagnosis not present

## 2021-04-17 DIAGNOSIS — M545 Low back pain, unspecified: Secondary | ICD-10-CM | POA: Diagnosis not present

## 2021-04-17 DIAGNOSIS — T80212D Local infection due to central venous catheter, subsequent encounter: Secondary | ICD-10-CM | POA: Diagnosis not present

## 2021-04-17 DIAGNOSIS — E669 Obesity, unspecified: Secondary | ICD-10-CM | POA: Diagnosis not present

## 2021-04-17 DIAGNOSIS — I130 Hypertensive heart and chronic kidney disease with heart failure and stage 1 through stage 4 chronic kidney disease, or unspecified chronic kidney disease: Secondary | ICD-10-CM | POA: Diagnosis not present

## 2021-04-17 DIAGNOSIS — J9691 Respiratory failure, unspecified with hypoxia: Secondary | ICD-10-CM | POA: Diagnosis not present

## 2021-04-17 DIAGNOSIS — M14672 Charcot's joint, left ankle and foot: Secondary | ICD-10-CM | POA: Diagnosis not present

## 2021-04-17 DIAGNOSIS — L304 Erythema intertrigo: Secondary | ICD-10-CM | POA: Diagnosis not present

## 2021-04-17 DIAGNOSIS — J45909 Unspecified asthma, uncomplicated: Secondary | ICD-10-CM | POA: Diagnosis not present

## 2021-04-17 DIAGNOSIS — Z9981 Dependence on supplemental oxygen: Secondary | ICD-10-CM | POA: Diagnosis not present

## 2021-04-17 DIAGNOSIS — E1142 Type 2 diabetes mellitus with diabetic polyneuropathy: Secondary | ICD-10-CM | POA: Diagnosis not present

## 2021-04-17 DIAGNOSIS — F419 Anxiety disorder, unspecified: Secondary | ICD-10-CM | POA: Diagnosis not present

## 2021-04-17 DIAGNOSIS — J302 Other seasonal allergic rhinitis: Secondary | ICD-10-CM | POA: Diagnosis not present

## 2021-04-17 DIAGNOSIS — I4892 Unspecified atrial flutter: Secondary | ICD-10-CM | POA: Diagnosis not present

## 2021-04-17 DIAGNOSIS — Z794 Long term (current) use of insulin: Secondary | ICD-10-CM | POA: Diagnosis not present

## 2021-04-17 DIAGNOSIS — F32A Depression, unspecified: Secondary | ICD-10-CM | POA: Diagnosis not present

## 2021-04-17 DIAGNOSIS — I5022 Chronic systolic (congestive) heart failure: Secondary | ICD-10-CM | POA: Diagnosis not present

## 2021-04-17 DIAGNOSIS — G4733 Obstructive sleep apnea (adult) (pediatric): Secondary | ICD-10-CM | POA: Diagnosis not present

## 2021-04-17 NOTE — Telephone Encounter (Signed)
Transition Care Management Follow-up Telephone Call ?Date of discharge and from where: 04/15/2021 from [facility] ?How have you been since you were released from the hospital? Patient stated that he is feeling okay and did not have any questions or concerns at this time.  ?Any questions or concerns? No ? ?Items Reviewed: ?Did the pt receive and understand the discharge instructions provided? Yes  ?Medications obtained and verified? Yes  ?Other? No  ?Any new allergies since your discharge? No  ?Dietary orders reviewed? No ?Do you have support at home? Yes  ? ?Functional Questionnaire: (I = Independent and D = Dependent) ?ADLs: I ? ?Bathing/Dressing- I ? ?Meal Prep- I ? ?Eating- I ? ?Maintaining continence- I ? ?Transferring/Ambulation- I ? ?Managing Meds- I ? ? ?Follow up appointments reviewed: ? ?PCP Hospital f/u appt confirmed? Yes  Scheduled to see Rudene Christians, DO on 04/23/2021 @ 10:15am. ?Specialist Hospital f/u appt confirmed? Yes  Scheduled to see Heart and Vascular on 04/23/2021 @ 01:30pm. ?Are transportation arrangements needed? No  ?If their condition worsens, is the pt aware to call PCP or go to the Emergency Dept.? Yes ?Was the patient provided with contact information for the PCP's office or ED? Yes ?Was to pt encouraged to call back with questions or concerns? Yes ? ?

## 2021-04-18 DIAGNOSIS — J302 Other seasonal allergic rhinitis: Secondary | ICD-10-CM | POA: Diagnosis not present

## 2021-04-18 DIAGNOSIS — J45909 Unspecified asthma, uncomplicated: Secondary | ICD-10-CM | POA: Diagnosis not present

## 2021-04-18 DIAGNOSIS — G4733 Obstructive sleep apnea (adult) (pediatric): Secondary | ICD-10-CM | POA: Diagnosis not present

## 2021-04-18 DIAGNOSIS — I25119 Atherosclerotic heart disease of native coronary artery with unspecified angina pectoris: Secondary | ICD-10-CM | POA: Diagnosis not present

## 2021-04-18 DIAGNOSIS — T80212D Local infection due to central venous catheter, subsequent encounter: Secondary | ICD-10-CM | POA: Diagnosis not present

## 2021-04-18 DIAGNOSIS — J9691 Respiratory failure, unspecified with hypoxia: Secondary | ICD-10-CM | POA: Diagnosis not present

## 2021-04-18 DIAGNOSIS — L304 Erythema intertrigo: Secondary | ICD-10-CM | POA: Diagnosis not present

## 2021-04-18 DIAGNOSIS — F32A Depression, unspecified: Secondary | ICD-10-CM | POA: Diagnosis not present

## 2021-04-18 DIAGNOSIS — F419 Anxiety disorder, unspecified: Secondary | ICD-10-CM | POA: Diagnosis not present

## 2021-04-18 DIAGNOSIS — Z9981 Dependence on supplemental oxygen: Secondary | ICD-10-CM | POA: Diagnosis not present

## 2021-04-18 DIAGNOSIS — E1142 Type 2 diabetes mellitus with diabetic polyneuropathy: Secondary | ICD-10-CM | POA: Diagnosis not present

## 2021-04-18 DIAGNOSIS — I5022 Chronic systolic (congestive) heart failure: Secondary | ICD-10-CM | POA: Diagnosis not present

## 2021-04-18 DIAGNOSIS — Z794 Long term (current) use of insulin: Secondary | ICD-10-CM | POA: Diagnosis not present

## 2021-04-18 DIAGNOSIS — E669 Obesity, unspecified: Secondary | ICD-10-CM | POA: Diagnosis not present

## 2021-04-18 DIAGNOSIS — M14672 Charcot's joint, left ankle and foot: Secondary | ICD-10-CM | POA: Diagnosis not present

## 2021-04-18 DIAGNOSIS — I130 Hypertensive heart and chronic kidney disease with heart failure and stage 1 through stage 4 chronic kidney disease, or unspecified chronic kidney disease: Secondary | ICD-10-CM | POA: Diagnosis not present

## 2021-04-18 DIAGNOSIS — M545 Low back pain, unspecified: Secondary | ICD-10-CM | POA: Diagnosis not present

## 2021-04-18 DIAGNOSIS — I4892 Unspecified atrial flutter: Secondary | ICD-10-CM | POA: Diagnosis not present

## 2021-04-19 DIAGNOSIS — I5022 Chronic systolic (congestive) heart failure: Secondary | ICD-10-CM | POA: Diagnosis not present

## 2021-04-19 DIAGNOSIS — I25119 Atherosclerotic heart disease of native coronary artery with unspecified angina pectoris: Secondary | ICD-10-CM | POA: Diagnosis not present

## 2021-04-19 DIAGNOSIS — I130 Hypertensive heart and chronic kidney disease with heart failure and stage 1 through stage 4 chronic kidney disease, or unspecified chronic kidney disease: Secondary | ICD-10-CM | POA: Diagnosis not present

## 2021-04-19 DIAGNOSIS — M545 Low back pain, unspecified: Secondary | ICD-10-CM | POA: Diagnosis not present

## 2021-04-19 DIAGNOSIS — J302 Other seasonal allergic rhinitis: Secondary | ICD-10-CM | POA: Diagnosis not present

## 2021-04-19 DIAGNOSIS — E669 Obesity, unspecified: Secondary | ICD-10-CM | POA: Diagnosis not present

## 2021-04-19 DIAGNOSIS — Z794 Long term (current) use of insulin: Secondary | ICD-10-CM | POA: Diagnosis not present

## 2021-04-19 DIAGNOSIS — J9691 Respiratory failure, unspecified with hypoxia: Secondary | ICD-10-CM | POA: Diagnosis not present

## 2021-04-19 DIAGNOSIS — G4733 Obstructive sleep apnea (adult) (pediatric): Secondary | ICD-10-CM | POA: Diagnosis not present

## 2021-04-19 DIAGNOSIS — F32A Depression, unspecified: Secondary | ICD-10-CM | POA: Diagnosis not present

## 2021-04-19 DIAGNOSIS — L304 Erythema intertrigo: Secondary | ICD-10-CM | POA: Diagnosis not present

## 2021-04-19 DIAGNOSIS — E1142 Type 2 diabetes mellitus with diabetic polyneuropathy: Secondary | ICD-10-CM | POA: Diagnosis not present

## 2021-04-19 DIAGNOSIS — F419 Anxiety disorder, unspecified: Secondary | ICD-10-CM | POA: Diagnosis not present

## 2021-04-19 DIAGNOSIS — Z9981 Dependence on supplemental oxygen: Secondary | ICD-10-CM | POA: Diagnosis not present

## 2021-04-19 DIAGNOSIS — I4892 Unspecified atrial flutter: Secondary | ICD-10-CM | POA: Diagnosis not present

## 2021-04-19 DIAGNOSIS — J45909 Unspecified asthma, uncomplicated: Secondary | ICD-10-CM | POA: Diagnosis not present

## 2021-04-19 DIAGNOSIS — T80212D Local infection due to central venous catheter, subsequent encounter: Secondary | ICD-10-CM | POA: Diagnosis not present

## 2021-04-19 DIAGNOSIS — M14672 Charcot's joint, left ankle and foot: Secondary | ICD-10-CM | POA: Diagnosis not present

## 2021-04-20 DIAGNOSIS — F32A Depression, unspecified: Secondary | ICD-10-CM | POA: Diagnosis not present

## 2021-04-20 DIAGNOSIS — M545 Low back pain, unspecified: Secondary | ICD-10-CM | POA: Diagnosis not present

## 2021-04-20 DIAGNOSIS — Z9981 Dependence on supplemental oxygen: Secondary | ICD-10-CM | POA: Diagnosis not present

## 2021-04-20 DIAGNOSIS — F419 Anxiety disorder, unspecified: Secondary | ICD-10-CM | POA: Diagnosis not present

## 2021-04-20 DIAGNOSIS — E1142 Type 2 diabetes mellitus with diabetic polyneuropathy: Secondary | ICD-10-CM | POA: Diagnosis not present

## 2021-04-20 DIAGNOSIS — I130 Hypertensive heart and chronic kidney disease with heart failure and stage 1 through stage 4 chronic kidney disease, or unspecified chronic kidney disease: Secondary | ICD-10-CM | POA: Diagnosis not present

## 2021-04-20 DIAGNOSIS — G4733 Obstructive sleep apnea (adult) (pediatric): Secondary | ICD-10-CM | POA: Diagnosis not present

## 2021-04-20 DIAGNOSIS — I5022 Chronic systolic (congestive) heart failure: Secondary | ICD-10-CM | POA: Diagnosis not present

## 2021-04-20 DIAGNOSIS — Z794 Long term (current) use of insulin: Secondary | ICD-10-CM | POA: Diagnosis not present

## 2021-04-20 DIAGNOSIS — E669 Obesity, unspecified: Secondary | ICD-10-CM | POA: Diagnosis not present

## 2021-04-20 DIAGNOSIS — T80212D Local infection due to central venous catheter, subsequent encounter: Secondary | ICD-10-CM | POA: Diagnosis not present

## 2021-04-20 DIAGNOSIS — J9691 Respiratory failure, unspecified with hypoxia: Secondary | ICD-10-CM | POA: Diagnosis not present

## 2021-04-20 DIAGNOSIS — M14672 Charcot's joint, left ankle and foot: Secondary | ICD-10-CM | POA: Diagnosis not present

## 2021-04-20 DIAGNOSIS — I4892 Unspecified atrial flutter: Secondary | ICD-10-CM | POA: Diagnosis not present

## 2021-04-20 DIAGNOSIS — L304 Erythema intertrigo: Secondary | ICD-10-CM | POA: Diagnosis not present

## 2021-04-20 DIAGNOSIS — J302 Other seasonal allergic rhinitis: Secondary | ICD-10-CM | POA: Diagnosis not present

## 2021-04-20 DIAGNOSIS — J45909 Unspecified asthma, uncomplicated: Secondary | ICD-10-CM | POA: Diagnosis not present

## 2021-04-20 DIAGNOSIS — I25119 Atherosclerotic heart disease of native coronary artery with unspecified angina pectoris: Secondary | ICD-10-CM | POA: Diagnosis not present

## 2021-04-21 DIAGNOSIS — I4892 Unspecified atrial flutter: Secondary | ICD-10-CM | POA: Diagnosis not present

## 2021-04-21 DIAGNOSIS — E1142 Type 2 diabetes mellitus with diabetic polyneuropathy: Secondary | ICD-10-CM | POA: Diagnosis not present

## 2021-04-21 DIAGNOSIS — I25119 Atherosclerotic heart disease of native coronary artery with unspecified angina pectoris: Secondary | ICD-10-CM | POA: Diagnosis not present

## 2021-04-21 DIAGNOSIS — G4733 Obstructive sleep apnea (adult) (pediatric): Secondary | ICD-10-CM | POA: Diagnosis not present

## 2021-04-21 DIAGNOSIS — F32A Depression, unspecified: Secondary | ICD-10-CM | POA: Diagnosis not present

## 2021-04-21 DIAGNOSIS — E669 Obesity, unspecified: Secondary | ICD-10-CM | POA: Diagnosis not present

## 2021-04-21 DIAGNOSIS — I130 Hypertensive heart and chronic kidney disease with heart failure and stage 1 through stage 4 chronic kidney disease, or unspecified chronic kidney disease: Secondary | ICD-10-CM | POA: Diagnosis not present

## 2021-04-21 DIAGNOSIS — J9691 Respiratory failure, unspecified with hypoxia: Secondary | ICD-10-CM | POA: Diagnosis not present

## 2021-04-21 DIAGNOSIS — I5022 Chronic systolic (congestive) heart failure: Secondary | ICD-10-CM | POA: Diagnosis not present

## 2021-04-21 DIAGNOSIS — L304 Erythema intertrigo: Secondary | ICD-10-CM | POA: Diagnosis not present

## 2021-04-21 DIAGNOSIS — M14672 Charcot's joint, left ankle and foot: Secondary | ICD-10-CM | POA: Diagnosis not present

## 2021-04-21 DIAGNOSIS — Z794 Long term (current) use of insulin: Secondary | ICD-10-CM | POA: Diagnosis not present

## 2021-04-21 DIAGNOSIS — M545 Low back pain, unspecified: Secondary | ICD-10-CM | POA: Diagnosis not present

## 2021-04-21 DIAGNOSIS — F419 Anxiety disorder, unspecified: Secondary | ICD-10-CM | POA: Diagnosis not present

## 2021-04-21 DIAGNOSIS — Z9981 Dependence on supplemental oxygen: Secondary | ICD-10-CM | POA: Diagnosis not present

## 2021-04-21 DIAGNOSIS — J302 Other seasonal allergic rhinitis: Secondary | ICD-10-CM | POA: Diagnosis not present

## 2021-04-21 DIAGNOSIS — J45909 Unspecified asthma, uncomplicated: Secondary | ICD-10-CM | POA: Diagnosis not present

## 2021-04-21 DIAGNOSIS — T80212D Local infection due to central venous catheter, subsequent encounter: Secondary | ICD-10-CM | POA: Diagnosis not present

## 2021-04-22 ENCOUNTER — Other Ambulatory Visit (HOSPITAL_COMMUNITY): Payer: Self-pay

## 2021-04-22 DIAGNOSIS — I5022 Chronic systolic (congestive) heart failure: Secondary | ICD-10-CM | POA: Diagnosis not present

## 2021-04-22 DIAGNOSIS — F32A Depression, unspecified: Secondary | ICD-10-CM | POA: Diagnosis not present

## 2021-04-22 DIAGNOSIS — M545 Low back pain, unspecified: Secondary | ICD-10-CM | POA: Diagnosis not present

## 2021-04-22 DIAGNOSIS — Z794 Long term (current) use of insulin: Secondary | ICD-10-CM | POA: Diagnosis not present

## 2021-04-22 DIAGNOSIS — I130 Hypertensive heart and chronic kidney disease with heart failure and stage 1 through stage 4 chronic kidney disease, or unspecified chronic kidney disease: Secondary | ICD-10-CM | POA: Diagnosis not present

## 2021-04-22 DIAGNOSIS — G4733 Obstructive sleep apnea (adult) (pediatric): Secondary | ICD-10-CM | POA: Diagnosis not present

## 2021-04-22 DIAGNOSIS — F419 Anxiety disorder, unspecified: Secondary | ICD-10-CM | POA: Diagnosis not present

## 2021-04-22 DIAGNOSIS — T80212D Local infection due to central venous catheter, subsequent encounter: Secondary | ICD-10-CM | POA: Diagnosis not present

## 2021-04-22 DIAGNOSIS — L304 Erythema intertrigo: Secondary | ICD-10-CM | POA: Diagnosis not present

## 2021-04-22 DIAGNOSIS — I25119 Atherosclerotic heart disease of native coronary artery with unspecified angina pectoris: Secondary | ICD-10-CM | POA: Diagnosis not present

## 2021-04-22 DIAGNOSIS — E669 Obesity, unspecified: Secondary | ICD-10-CM | POA: Diagnosis not present

## 2021-04-22 DIAGNOSIS — J45909 Unspecified asthma, uncomplicated: Secondary | ICD-10-CM | POA: Diagnosis not present

## 2021-04-22 DIAGNOSIS — J9691 Respiratory failure, unspecified with hypoxia: Secondary | ICD-10-CM | POA: Diagnosis not present

## 2021-04-22 DIAGNOSIS — M14672 Charcot's joint, left ankle and foot: Secondary | ICD-10-CM | POA: Diagnosis not present

## 2021-04-22 DIAGNOSIS — Z9981 Dependence on supplemental oxygen: Secondary | ICD-10-CM | POA: Diagnosis not present

## 2021-04-22 DIAGNOSIS — E1142 Type 2 diabetes mellitus with diabetic polyneuropathy: Secondary | ICD-10-CM | POA: Diagnosis not present

## 2021-04-22 DIAGNOSIS — J302 Other seasonal allergic rhinitis: Secondary | ICD-10-CM | POA: Diagnosis not present

## 2021-04-22 DIAGNOSIS — I4892 Unspecified atrial flutter: Secondary | ICD-10-CM | POA: Diagnosis not present

## 2021-04-22 NOTE — Progress Notes (Signed)
ADVANCED HF CLINIC NOTE  PCP: [name], MD HF Cardiologist: Dr. Haroldine Laws   HPI: Mr Kuster is a 36 y.o.with a history of chronic HFrEF, NICM, HTN, asthma, OSA, morbid obesity, and uncontrolled DM.    Diagnosed with systolic HF in [place] 0086. EF 15-20%  Repeat Echo 02/2017. EF had normalized to 55% but went back down when he was off HF meds. EF in 2021 back down to 30-35%. He has refused ICD.    He has been followed in the HF clinic and was last seen 05/2020.  He cancelled follow up appointments 08/2020 and 09/2020.   Admitted 10/28/20 with A/C HFrEF, A fib RVR , and marked volume overload. Hospital course complicated by low output so milrinone started. Diuresed with lasix drip + metolazone+ diamox.   Failed milrinone wean and was discharged on milrinone. He was not a candidate for advanced therapies due to noncompliance and uncontrolled diabetes. HGB A1C >14, Palliative consulted for goals of care. He elected DNR/DNI. Referred to [facility] for Hospice services.   Seen in ED 11/25/20 with home milrinone pump issues.   Admitted 04/05/21 with sepsis and A/C HFrEF.  Had been on home milrinone through PICC. Blood CX Enterococcus faecalis and Staph epidermidis bacteremia. Placed on ceftriaxone + ampicllin. PICC removed. Diuresed with IV lasix.HF meds adjusted. PICC line replaced once cultures clear. Discharged on 2 weeks of linezolid. Discharged on milrinone 0.375 mcg. Plan to consider transplant if he can lose weight. Discharge weight 337 pounds.   Today he returns for HF follow up with his girlfriend. Overall feeling fine. Having some shortness of breath with exertion. Denies PND/Orthopnea. No issues with his PICC line.  Appetite ok. Had chinese food last night for his birthday. He noticed some leg edema.  No fever or chills. Weight at home 337-342 pounds. Taking all medications. Followed by [facility].   Cardiac Studies - 02/2021 EF < 20% RV severely reduced.    - Echo 9/22: EF<20%, severely  decreased LV, grade II DD.  - Echo 10/21: EF 30-35% with moderate RV dysfunction in setting of recurrent RBBB with significant dyssynchrony.  - Echo 04/2019: EF 30-35%  - Echo 2019: EF 55%   - Echo EF 15-20% in [place] in 2017. Cath around that time without   ROS: All systems negative except as listed in HPI, PMH and Problem List.  SH:  Social History   Socioeconomic History   Marital status: Married    Spouse name: Not on file   Number of children: Not on file   Years of education: Not on file   Highest education level: Not on file  Occupational History   Not on file  Tobacco Use   Smoking status: Never   Smokeless tobacco: Never  Vaping Use   Vaping Use: Never used  Substance and Sexual Activity   Alcohol use: No   Drug use: No   Sexual activity: Never  Other Topics Concern   Not on file  Social History Narrative   Not on file   Social Determinants of Health   Financial Resource Strain: Low Risk    Difficulty of Paying Living Expenses: Not very hard  Food Insecurity: No Food Insecurity   Worried About Running Out of Food in the Last Year: Never true   Ran Out of Food in the Last Year: Never true  Transportation Needs: No Transportation Needs   Lack of Transportation (Medical): No   Lack of Transportation (Non-Medical): No  Physical Activity: Not on  file  Stress: Not on file  Social Connections: Not on file  Intimate Partner Violence: Not on file   FH:  Family History  Problem Relation Age of Onset   Diabetes Mellitus II Mother    Heart failure Mother    Hypertension Mother    Heart disease Mother    Heart failure Father    Kidney disease Father    Heart disease Father    Congestive Heart Failure Neg Hx    Past Medical History:  Diagnosis Date   Acute pain of left foot 05/03/2017   Asthma    Asthma, chronic, mild persistent, uncomplicated 82/95/6213   Charcot ankle, left 05/31/2017   CHF (congestive heart failure) (HCC)    Chronic systolic (congestive)  heart failure ([place])    Diabetes mellitus type 2 in obese ([place]) 04/21/2014   Diabetic polyneuropathy associated with type 2 diabetes mellitus ([place]) 03/14/2017   Essential hypertension 10/26/2016   Hypertension    Obesity    Obesity, Class III, BMI 40-49.9 (morbid obesity) ([place]) 10/26/2016   OSA (obstructive sleep apnea) 12/02/2016   Type 2 diabetes mellitus with diabetic neuropathy (HCC)    Current Outpatient Medications  Medication Sig Dispense Refill   Accu-Chek Softclix Lancets lancets Check blood sugar 3x a day 100 each 11   albuterol (VENTOLIN HFA) 108 (90 Base) MCG/ACT inhaler Inhale 2 puffs into the lungs every 4 (four) hours as needed for wheezing or shortness of breath. 18 g 0   amiodarone (PACERONE) 200 MG tablet Take 0.5 tablets (100 mg total) by mouth daily. 15 tablet 2   apixaban (ELIQUIS) 5 MG TABS tablet Take 1 tablet (5 mg total) by mouth 2 (two) times daily. 60 tablet 2   azelastine (ASTELIN) 0.1 % nasal spray Place into both nostrils as needed for rhinitis. Use in each nostril as directed     blood glucose meter kit and supplies KIT Dispense based on patient and insurance preference. Use up to four times daily as directed. (FOR ICD-9 250.00, 250.01). For QAC - HS accuchecks. May switch to any brand. 1 each 1   Continuous Blood Gluc Sensor (FREESTYLE LIBRE 2 SENSOR) MISC Use to check blood sugar at least 6 times a day 2 each 11   [employer] SENNA 8.6 MG tablet Take 1 tablet by mouth daily as needed for constipation.     cyclobenzaprine (FLEXERIL) 5 MG tablet Take 5 mg by mouth every 6 (six) hours as needed for muscle spasms.     digoxin (LANOXIN) 0.125 MG tablet TAKE 1 TABLET BY MOUTH EVERY DAY (Patient taking differently: Take 0.125 mg by mouth daily.) 90 tablet 3   gabapentin (NEURONTIN) 300 MG capsule Take 300 mg by mouth as needed (pain).     glucose blood (ACCU-CHEK GUIDE) test strip Check blood sugar 3 times per day 100 each 11   hyoscyamine (LEVSIN SL) 0.125 MG SL tablet Place  0.125 mg under the tongue every 4 (four) hours as needed for cramping (secretions).     insulin aspart (NOVOLOG) 100 UNIT/ML injection Inject 20 Units into the skin 3 (three) times daily with meals. 20 mL 11   insulin glargine (LANTUS) 100 UNIT/ML injection Inject 0.3 mLs (30 Units total) into the skin 2 (two) times daily. 20 mL 11   Insulin Syringe-Needle U-100 30G X 1/2" 1 ML MISC Use to inject insulin 4 times a day. 100 each 2   isosorbide-hydrALAZINE (BIDIL) 20-37.5 MG tablet Take 1 tablet by mouth 3 (three) times daily.  90 tablet 2   linezolid (ZYVOX) 600 MG tablet Take 1 tablet (600 mg total) by mouth every 12 (twelve) hours for 9 days. 18 tablet 0   loratadine (CLARITIN) 10 MG tablet Take 1 tablet (10 mg total) by mouth 2 (two) times daily as needed for allergies (Can use an extra dose during flare ups.). 60 tablet 5   metolazone (ZAROXOLYN) 2.5 MG tablet Take 2.5 mg by mouth as needed.     milrinone (PRIMACOR) 20 MG/100 ML SOLN infusion Inject 0.0596 mg/min into the vein continuous. 100 mL 0   nitroGLYCERIN (NITROSTAT) 0.3 MG SL tablet Place 0.3 mg under the tongue every 5 (five) minutes as needed for chest pain.     ondansetron (ZOFRAN) 4 MG tablet Take 4 mg by mouth daily as needed for nausea or vomiting.     potassium chloride SA (KLOR-CON M) 20 MEQ tablet Take 2 tablets (40 mEq total) by mouth 2 (two) times daily. (Patient taking differently: Take 120 mEq by mouth 2 (two) times daily.) 120 tablet 0   sacubitril-valsartan (ENTRESTO) 24-26 MG Take 1 tablet by mouth 2 (two) times daily. 60 tablet 0   spironolactone (ALDACTONE) 25 MG tablet Take 1 tablet (25 mg total) by mouth daily. 30 tablet 0   tirzepatide (MOUNJARO) 10 MG/0.5ML Pen Inject 10 mg into the skin once a week. 2 mL 0   torsemide (DEMADEX) 20 MG tablet Take 4 tablets (80 mg total) by mouth 2 (two) times daily. 240 tablet 0   traMADol (ULTRAM) 50 MG tablet TAKE 1 TABLET BY MOUTH EVERY 6 HOURS AS NEEDED. (Patient taking  differently: Take 50 mg by mouth every 6 (six) hours as needed for moderate pain.) 20 tablet 0   No current facility-administered medications for this encounter.   BP (!) 158/78    Pulse (!) 108    Wt (!) 157.8 kg    SpO2 97%    BMI 44.65 kg/m   Wt Readings from Last 3 Encounters:  04/23/21 (!) 157.8 kg  04/23/21 (!) 159 kg  04/15/21 (!) 153 kg   PHYSICAL EXAM: General:  Walked in the clinic.  No resp difficulty HEENT: normal Neck: supple. JVP 9-10 . Carotids 2+ bilat; no bruits. No lymphadenopathy or thryomegaly appreciated. Cor: PMI nondisplaced. Tachy Regular rate & rhythm. No rubs, gallops or murmurs. Lungs: clear Abdomen: obese soft, nontender, nondistended. No hepatosplenomegaly. No bruits or masses. Good bowel sounds. Extremities: no cyanosis, clubbing, rash, R and LLE 2+ edema. RUE double lumen PICC. Site ok.  Neuro: alert & orientedx3, cranial nerves grossly intact. moves all 4 extremities w/o difficulty. Affect pleasant  EKG: Sinus Tach 106 bpm   ASSESSMENT & PLAN:  End Stage HFrEF Biventricular, NICM .  - Echo back in 2021 EF 30-35 % with moderate RV dysfunction in the setting of recurrent RBBB with significant dyssynchrony.  - Echo 10/2020 EF < 20% and moderate RV dysfunction.  - Echo 02/2021 EF < 20%  RV  severely reduced.  -  On chronic milrinone 0.375 mcg.  - NYHA III.  Volume status elevated in the setting of high sodium diet. Discussed low salt food choices.  - Given 80 mg IV lasix in the clinic. Hold afternoon torsemide. Tomorrow he will resume torsemide 80 mg twice a day.  - Continue spiro 25 mg daily.  - Continue entresto 24-26 mg twice a day. - Will need follow up next week to reassess volume status. May need to add scheduled metolazone.  2. Uncontrolled DM - 9/1 Hgb A1C > 14 -> 8.3 - No SGLT2i d/t candidiasis - Per PCP  3. CKD Stage IIIb - Renal function stable from earlier today.    4. Paroxysmal Atrial Flutter  -EKG today - In ST 100s .  -  Continue amio to 100 mg daily.  - Continue Eliquis 5 mg bid. No bleeding issues  5. OSA: Severe OSA AHI 65 - Does not wear CPAP.  6. Obesity - Body mass index is 44.65 kg/m. - Discussed portion control. PCP working with him.  - Starting [facility] when available for weight loss.   7. ID  03/2021 Blood CX Enterococcus faecalis and Staph epidermidis bacteremia. Treated with IV antibiotics. PICC replaced. Completing linezolid at home.   8.  GOC -  Now on [facility]. If unable to get weight off may need to consider Hospice.   Follow up next week to reassess volume status.   Jewelianna Pancoast NP-C  12:57 PM

## 2021-04-23 ENCOUNTER — Other Ambulatory Visit: Payer: Self-pay

## 2021-04-23 ENCOUNTER — Ambulatory Visit (HOSPITAL_COMMUNITY)
Admit: 2021-04-23 | Discharge: 2021-04-23 | Disposition: A | Discharge status: Home / Self Care | Payer: Medicaid Other | Source: Ambulatory Visit | Attending: Adult Health | Admitting: Adult Health

## 2021-04-23 ENCOUNTER — Encounter: Payer: Self-pay | Admitting: Internal Medicine

## 2021-04-23 ENCOUNTER — Encounter (HOSPITAL_COMMUNITY): Payer: Self-pay

## 2021-04-23 ENCOUNTER — Ambulatory Visit: Payer: Medicaid Other | Admitting: Internal Medicine

## 2021-04-23 VITALS — BP 110/55 | HR 100 | Temp 98.1°F | Wt 350.6 lb

## 2021-04-23 VITALS — BP 158/78 | HR 108 | Wt 347.8 lb

## 2021-04-23 DIAGNOSIS — J45909 Unspecified asthma, uncomplicated: Secondary | ICD-10-CM | POA: Insufficient documentation

## 2021-04-23 DIAGNOSIS — J9691 Respiratory failure, unspecified with hypoxia: Secondary | ICD-10-CM | POA: Diagnosis not present

## 2021-04-23 DIAGNOSIS — J302 Other seasonal allergic rhinitis: Secondary | ICD-10-CM | POA: Diagnosis not present

## 2021-04-23 DIAGNOSIS — E1165 Type 2 diabetes mellitus with hyperglycemia: Secondary | ICD-10-CM | POA: Insufficient documentation

## 2021-04-23 DIAGNOSIS — E1122 Type 2 diabetes mellitus with diabetic chronic kidney disease: Secondary | ICD-10-CM | POA: Insufficient documentation

## 2021-04-23 DIAGNOSIS — I4510 Unspecified right bundle-branch block: Secondary | ICD-10-CM | POA: Insufficient documentation

## 2021-04-23 DIAGNOSIS — I4892 Unspecified atrial flutter: Secondary | ICD-10-CM | POA: Diagnosis not present

## 2021-04-23 DIAGNOSIS — E669 Obesity, unspecified: Secondary | ICD-10-CM

## 2021-04-23 DIAGNOSIS — F419 Anxiety disorder, unspecified: Secondary | ICD-10-CM | POA: Diagnosis not present

## 2021-04-23 DIAGNOSIS — I5022 Chronic systolic (congestive) heart failure: Secondary | ICD-10-CM | POA: Insufficient documentation

## 2021-04-23 DIAGNOSIS — Z79899 Other long term (current) drug therapy: Secondary | ICD-10-CM | POA: Insufficient documentation

## 2021-04-23 DIAGNOSIS — I5023 Acute on chronic systolic (congestive) heart failure: Secondary | ICD-10-CM | POA: Diagnosis not present

## 2021-04-23 DIAGNOSIS — I130 Hypertensive heart and chronic kidney disease with heart failure and stage 1 through stage 4 chronic kidney disease, or unspecified chronic kidney disease: Secondary | ICD-10-CM | POA: Diagnosis not present

## 2021-04-23 DIAGNOSIS — Z6841 Body Mass Index (BMI) 40.0 and over, adult: Secondary | ICD-10-CM | POA: Diagnosis not present

## 2021-04-23 DIAGNOSIS — Z9981 Dependence on supplemental oxygen: Secondary | ICD-10-CM | POA: Diagnosis not present

## 2021-04-23 DIAGNOSIS — L304 Erythema intertrigo: Secondary | ICD-10-CM | POA: Diagnosis not present

## 2021-04-23 DIAGNOSIS — R7881 Bacteremia: Secondary | ICD-10-CM

## 2021-04-23 DIAGNOSIS — R21 Rash and other nonspecific skin eruption: Secondary | ICD-10-CM

## 2021-04-23 DIAGNOSIS — Z794 Long term (current) use of insulin: Secondary | ICD-10-CM | POA: Diagnosis not present

## 2021-04-23 DIAGNOSIS — R0602 Shortness of breath: Secondary | ICD-10-CM | POA: Diagnosis present

## 2021-04-23 DIAGNOSIS — R000 Tachycardia, unspecified: Secondary | ICD-10-CM

## 2021-04-23 DIAGNOSIS — M545 Low back pain, unspecified: Secondary | ICD-10-CM | POA: Diagnosis not present

## 2021-04-23 DIAGNOSIS — I428 Other cardiomyopathies: Secondary | ICD-10-CM | POA: Diagnosis not present

## 2021-04-23 DIAGNOSIS — F32A Depression, unspecified: Secondary | ICD-10-CM | POA: Diagnosis not present

## 2021-04-23 DIAGNOSIS — N1832 Chronic kidney disease, stage 3b: Secondary | ICD-10-CM | POA: Diagnosis not present

## 2021-04-23 DIAGNOSIS — M14672 Charcot's joint, left ankle and foot: Secondary | ICD-10-CM | POA: Diagnosis not present

## 2021-04-23 DIAGNOSIS — N179 Acute kidney failure, unspecified: Secondary | ICD-10-CM

## 2021-04-23 DIAGNOSIS — G4733 Obstructive sleep apnea (adult) (pediatric): Secondary | ICD-10-CM | POA: Diagnosis not present

## 2021-04-23 DIAGNOSIS — N183 Chronic kidney disease, stage 3 unspecified: Secondary | ICD-10-CM

## 2021-04-23 DIAGNOSIS — Z7901 Long term (current) use of anticoagulants: Secondary | ICD-10-CM | POA: Diagnosis not present

## 2021-04-23 DIAGNOSIS — Z66 Do not resuscitate: Secondary | ICD-10-CM | POA: Insufficient documentation

## 2021-04-23 DIAGNOSIS — T80212D Local infection due to central venous catheter, subsequent encounter: Secondary | ICD-10-CM | POA: Diagnosis not present

## 2021-04-23 DIAGNOSIS — E1169 Type 2 diabetes mellitus with other specified complication: Secondary | ICD-10-CM

## 2021-04-23 DIAGNOSIS — E1142 Type 2 diabetes mellitus with diabetic polyneuropathy: Secondary | ICD-10-CM | POA: Diagnosis not present

## 2021-04-23 DIAGNOSIS — I25119 Atherosclerotic heart disease of native coronary artery with unspecified angina pectoris: Secondary | ICD-10-CM | POA: Diagnosis not present

## 2021-04-23 LAB — BASIC METABOLIC PANEL
Anion gap: 13 (ref 5–15)
BUN: 32 mg/dL — ABNORMAL HIGH (ref 6–20)
CO2: 25 mmol/L (ref 22–32)
Calcium: 9.6 mg/dL (ref 8.9–10.3)
Chloride: 93 mmol/L — ABNORMAL LOW (ref 98–111)
Creatinine, Ser: 1.26 mg/dL — ABNORMAL HIGH (ref 0.61–1.24)
GFR, Estimated: >60 mL/min (ref >60)
Glucose, Bld: 314 mg/dL — ABNORMAL HIGH (ref 70–99)
Potassium: 3 mmol/L — ABNORMAL LOW (ref 3.5–5.1)
Sodium: 131 mmol/L — ABNORMAL LOW (ref 135–145)

## 2021-04-23 MED ORDER — FUROSEMIDE 10 MG/ML IJ SOLN
80.0000 mg | Freq: Once | INTRAMUSCULAR | Status: AC
  Administered 2021-04-23: 13:00:00 80 mg via INTRAVENOUS

## 2021-04-23 NOTE — Assessment & Plan Note (Signed)
Medications: freestyle, Aspart 20 TID, Glargine 30 BID, Tirzapatide 10 mg (new med) ?Patient has not been able to start Tirzapatide yet because of insurance authorization. He states that he has been checking blood sugar before eating and blood sugar is usually around 170. He has not had hypoglycemia. His wife states that she was told on Monday that authorization could take up to 72 hours. ?P: ?Instructed patient to call tomorrow if authorization not approved and we would restart trulicity at that time until he could get GLP1/GIP.  ?-Continue Aspart 20 TID and Glargine 30 BID ?

## 2021-04-23 NOTE — Assessment & Plan Note (Signed)
Patient with end stage heart failure presents for hospital follow-up for acute CHF exacerbation. Last EF 1/23 of <20%. He was evaluated by cardiothoracic surgery while in-patient for possible LVAD, but will need to lose weight before that can be an option. At discharge, weight was 337lbs. Today he is at 350 lbs. Patient states that he ate chinese food which is his favorite for his birthday yesterday. Overall he feels better since being home from hospital. He denies shortness of breath, orthopnea, or lower extremity edema. His wife is with him today and helps him with medications and monitoring of therapy. She states that urine output has been about 2L over last day. Patient has not taken torsemide this morning. ?A/P: ?Continue current regimen per heart failure team. ?He was seen in Heart failure clinic this afternoon and will follow-up with them 3/15 to recheck volume status. ?They may add on scheduled metolazone. ?BMP with hypokalemia at 3.0, called patient and he said that he takes 133meq BID normally and had not taken dose before coming in. He has follow-up with heart failure team next week. I will message Provider who saw him this afternoon to let them know he will need repeat BMP at that time. If they do not have lab, will order BMP for lab visit only. ?

## 2021-04-23 NOTE — Assessment & Plan Note (Signed)
Creatinine increased to 1.42 while in hospital. ?P:  ?-creatinine improved to 1.2 on repeat ?

## 2021-04-23 NOTE — Patient Instructions (Signed)
Do NOT take Torsemide TODAY ? ?Take Potassium when you get home ? ?Your physician recommends that you schedule a follow-up appointment in: 1 week ? ?Do the following things EVERYDAY: ?Weigh yourself in the morning before breakfast. Write it down and keep it in a log. ?Take your medicines as prescribed ?Eat low salt foods--Limit salt (sodium) to 2000 mg per day.  ?Stay as active as you can everyday ?Limit all fluids for the day to less than 2 liters ? ?If you have any questions or concerns before your next appointment please send [place] a message through [place] or call our office at [phone].   ? ?TO LEAVE A MESSAGE FOR THE NURSE SELECT OPTION 2, PLEASE LEAVE A MESSAGE INCLUDING: ?YOUR NAME ?DATE OF BIRTH ?CALL BACK NUMBER ?REASON FOR CALL**this is important as we prioritize the call backs ? ?YOU WILL RECEIVE A CALL BACK THE SAME DAY AS LONG AS YOU CALL BEFORE 4:00 PM ? ?At the [facility], you and your health needs are our priority. As part of our continuing mission to provide you with exceptional heart care, we have created designated Provider Care Teams. These Care Teams include your primary Cardiologist (physician) and Advanced Practice Providers (APPs- Physician Assistants and Nurse Practitioners) who all work together to provide you with the care you need, when you need it.  ? ?You may see any of the following providers on your designated Care Team at your next follow up: ?Dr Glori Bickers ?Dr Loralie Champagne ?Darrick Grinder, NP ?Lyda Jester, PA ?Jessica Milford,NP ?Marlyce Huge, PA ?Audry Riles, PharmD ? ? ?Please be sure to bring in all your medications bottles to every appointment.  ? ? ?

## 2021-04-23 NOTE — Progress Notes (Signed)
? ? ?Subjective:  ?CC: hospital follow-up from acute on chronic heart failure exacerbation in end-stage heart failure patient ? ?HPI: ? ?[name] is a 36 y.o. male with a past medical history stated below and presents today for hospital follow-up from acute on chronic heart failure exacerbation in end-stage heart failure patient. Please see problem based assessment and plan for additional details. ? ?Past Medical History:  ?Diagnosis Date  ? Acute exacerbation of CHF (congestive heart failure) ([place]) 04/05/2021  ? Acute on chronic heart failure ([place]) 10/28/2020  ? Acute pain of left foot 05/03/2017  ? Asthma   ? Asthma, chronic, mild persistent, uncomplicated 50/53/9767  ? Charcot ankle, left 05/31/2017  ? CHF (congestive heart failure) ([place])   ? Chronic systolic (congestive) heart failure (HCC)   ? Diabetes mellitus ([place])   ? Diabetes mellitus type 2 in obese ([place]) 04/21/2014  ? Diabetic polyneuropathy associated with type 2 diabetes mellitus ([place]) 03/14/2017  ? Essential hypertension 10/26/2016  ? Hypertension   ? Obesity   ? Obesity, Class III, BMI 40-49.9 (morbid obesity) ([place]) 10/26/2016  ? OSA (obstructive sleep apnea) 12/02/2016  ? Type 2 diabetes mellitus with diabetic neuropathy (HCC)   ? ? ?Current Outpatient Medications on File Prior to Visit  ?Medication Sig Dispense Refill  ? Accu-Chek Softclix Lancets lancets Check blood sugar 3x a day 100 each 11  ? albuterol (VENTOLIN HFA) 108 (90 Base) MCG/ACT inhaler Inhale 2 puffs into the lungs every 4 (four) hours as needed for wheezing or shortness of breath. 18 g 0  ? amiodarone (PACERONE) 200 MG tablet Take 0.5 tablets (100 mg total) by mouth daily. 15 tablet 2  ? apixaban (ELIQUIS) 5 MG TABS tablet Take 1 tablet (5 mg total) by mouth 2 (two) times daily. 60 tablet 2  ? azelastine (ASTELIN) 0.1 % nasal spray Place into both nostrils as needed for rhinitis. Use in each nostril as directed    ? blood glucose meter kit and supplies KIT Dispense based on  patient and insurance preference. Use up to four times daily as directed. (FOR ICD-9 250.00, 250.01). For QAC - HS accuchecks. May switch to any brand. 1 each 1  ? Continuous Blood Gluc Sensor (FREESTYLE LIBRE 2 SENSOR) MISC Use to check blood sugar at least 6 times a day 2 each 11  ? [employer] SENNA 8.6 MG tablet Take 1 tablet by mouth daily as needed for constipation.    ? cyclobenzaprine (FLEXERIL) 5 MG tablet Take 5 mg by mouth every 6 (six) hours as needed for muscle spasms.    ? digoxin (LANOXIN) 0.125 MG tablet TAKE 1 TABLET BY MOUTH EVERY DAY (Patient taking differently: Take 0.125 mg by mouth daily.) 90 tablet 3  ? gabapentin (NEURONTIN) 300 MG capsule Take 300 mg by mouth as needed (pain).    ? glucose blood (ACCU-CHEK GUIDE) test strip Check blood sugar 3 times per day 100 each 11  ? hyoscyamine (LEVSIN SL) 0.125 MG SL tablet Place 0.125 mg under the tongue every 4 (four) hours as needed for cramping (secretions).    ? insulin aspart (NOVOLOG) 100 UNIT/ML injection Inject 20 Units into the skin 3 (three) times daily with meals. 20 mL 11  ? insulin glargine (LANTUS) 100 UNIT/ML injection Inject 0.3 mLs (30 Units total) into the skin 2 (two) times daily. 20 mL 11  ? Insulin Syringe-Needle U-100 30G X 1/2" 1 ML MISC Use to inject insulin 4 times a day. 100 each 2  ? isosorbide-hydrALAZINE (  BIDIL) 20-37.5 MG tablet Take 1 tablet by mouth 3 (three) times daily. 90 tablet 2  ? linezolid (ZYVOX) 600 MG tablet Take 1 tablet (600 mg total) by mouth every 12 (twelve) hours for 9 days. 18 tablet 0  ? loratadine (CLARITIN) 10 MG tablet Take 1 tablet (10 mg total) by mouth 2 (two) times daily as needed for allergies (Can use an extra dose during flare ups.). 60 tablet 5  ? metolazone (ZAROXOLYN) 2.5 MG tablet Take 2.5 mg by mouth as needed.    ? milrinone (PRIMACOR) 20 MG/100 ML SOLN infusion Inject 0.0596 mg/min into the vein continuous. 100 mL 0  ? nitroGLYCERIN (NITROSTAT) 0.3 MG SL tablet Place 0.3 mg under the tongue  every 5 (five) minutes as needed for chest pain.    ? ondansetron (ZOFRAN) 4 MG tablet Take 4 mg by mouth daily as needed for nausea or vomiting.    ? potassium chloride SA (KLOR-CON M) 20 MEQ tablet Take 2 tablets (40 mEq total) by mouth 2 (two) times daily. (Patient taking differently: Take 120 mEq by mouth 2 (two) times daily.) 120 tablet 0  ? sacubitril-valsartan (ENTRESTO) 24-26 MG Take 1 tablet by mouth 2 (two) times daily. 60 tablet 0  ? spironolactone (ALDACTONE) 25 MG tablet Take 1 tablet (25 mg total) by mouth daily. 30 tablet 0  ? tirzepatide (MOUNJARO) 10 MG/0.5ML Pen Inject 10 mg into the skin once a week. 2 mL 0  ? torsemide (DEMADEX) 20 MG tablet Take 4 tablets (80 mg total) by mouth 2 (two) times daily. 240 tablet 0  ? traMADol (ULTRAM) 50 MG tablet TAKE 1 TABLET BY MOUTH EVERY 6 HOURS AS NEEDED. (Patient taking differently: Take 50 mg by mouth every 6 (six) hours as needed for moderate pain.) 20 tablet 0  ? ?No current facility-administered medications on file prior to visit.  ? ? ?Family History  ?Problem Relation Age of Onset  ? Diabetes Mellitus II Mother   ? Heart failure Mother   ? Hypertension Mother   ? Heart disease Mother   ? Heart failure Father   ? Kidney disease Father   ? Heart disease Father   ? Congestive Heart Failure Neg Hx   ? ? ?Social History  ? ?Socioeconomic History  ? Marital status: Married  ?  Spouse name: Not on file  ? Number of children: Not on file  ? Years of education: Not on file  ? Highest education level: Not on file  ?Occupational History  ? Not on file  ?Tobacco Use  ? Smoking status: Never  ? Smokeless tobacco: Never  ?Vaping Use  ? Vaping Use: Never used  ?Substance and Sexual Activity  ? Alcohol use: No  ? Drug use: No  ? Sexual activity: Never  ?Other Topics Concern  ? Not on file  ?Social History Narrative  ? Not on file  ? ?Social Determinants of Health  ? ?Financial Resource Strain: Low Risk   ? Difficulty of Paying Living Expenses: Not very hard  ?Food  Insecurity: No Food Insecurity  ? Worried About Charity fundraiser in the Last Year: Never true  ? Ran Out of Food in the Last Year: Never true  ?Transportation Needs: No Transportation Needs  ? Lack of Transportation (Medical): No  ? Lack of Transportation (Non-Medical): No  ?Physical Activity: Not on file  ?Stress: Not on file  ?Social Connections: Not on file  ?Intimate Partner Violence: Not on file  ? ? ?Review  of Systems: ?ROS negative except for what is noted on the assessment and plan. ? ?Objective:  ? ?Vitals:  ? 04/23/21 1034 04/23/21 1147  ?BP: (!) 113/48 (!) 110/55  ?Pulse: 100   ?Temp: 98.1 ?F (36.7 ?C)   ?TempSrc: Oral   ?SpO2: 100%   ?Weight: (!) 350 lb 9.6 oz (159 kg)   ? ? ?Physical Exam: ?Gen: A&O x3 and in no apparent distress, well appearing and nourished. ?HEENT:  ?  Head - normocephalic, atraumatic.  ?  Eye - visual acuity grossly intact, conjunctiva clear, sclera non-icteric, EOM intact.  ?  Mouth - No obvious caries or periodontal disease. ?Neck: no masses or nodules, AROM intact. ?CV: RRR, no murmurs, S1/S2 presents  ?Resp: Clear to auscultation bilaterally, normal pulmonary effort on room air  ?Abd: BS (+) x4, soft, non-tender abdomen, without hepatosplenomegaly or masses ?MSK: Grossly normal AROM and strength x4 extremities, 1+ pitting edema to lower extremities bilaterally ?Skin: good skin turgor, no rashes, unusual bruising, or prominent lesions.  ?Neuro: No focal deficits, grossly normal sensation and coordination.  ?Psych: Oriented x3 and responding appropriately. Intact memory, normal mood, judgement, affect, and insight.  ? ? ?Assessment & Plan:  ?See Encounters Tab for problem based charting. ? ?Patient discussed with Dr. Saverio Danker ? ? ?Christiana Fuchs, D.O. ?[facility]  PGY-1 ?Pager: [phone]  Phone: [phone] ?Date 04/23/2021  Time 5:24 PM  ?

## 2021-04-23 NOTE — Patient Instructions (Signed)
[name], it was a pleasure seeing you today! ? ?Today we discussed: ?Heart failure ?Please follow-up with heart failure clinic this afternoon. Your weight is up 14 lbs from discharge from hospital and they may want to increase some medications if your blood pressure allows. ? ?Diabetes ?If you are not able to get Tirzapetide by tomorrow, please call the clinic.  ? ?Blood infection ?Continue taking linezolid. I am going to reach out to the infectious disease physician you saw in the hospital to make sure you are still being treated adequately with missing days of treatment. I will call and let you know what they say. I'd like to see you in 2 weeks to make sure you have completed course and are doing ok. ? ?Kidneys ?I am rechecking some blood work to make sure your kidneys are recovering. ? ?I have ordered the following labs today: ? ? ?Lab Orders    ?     BMP8+Anion Gap     ? ?I will call if any are abnormal. All of your labs can be accessed through "My Chart" ?  ?My Chart Access: ?[email or web address] ? ? ?Referrals ordered today:  ? ?Referral Orders  ?No referral(s) requested today  ?  ? ?I have ordered the following medication/changed the following medications:  ? ?Stop the following medications: ?There are no discontinued medications.  ? ?Start the following medications: ?No orders of the defined types were placed in this encounter. ?  ? ?Follow-up:  2 weeks   ? ?Please make sure to arrive 15 minutes prior to your next appointment. If you arrive late, you may be asked to reschedule.  ? ?We look forward to seeing you next time. Please call our clinic at [phone] if you have any questions or concerns. The best time to call is Monday-Friday from 9am-4pm, but there is someone available 24/7. If after hours or the weekend, call the main hospital number and ask for the Internal Medicine Resident On-Call. If you need medication refills, please notify your pharmacy one  week in advance and they will send [place] a request. ? ?Thank you for letting us take part in your care. Wishing you the best! ? ?Thank you, ?Dr. Sloan Leiter ?[facility]  ?

## 2021-04-23 NOTE — Assessment & Plan Note (Signed)
Patient states that he has had a rash since being in hospital. The rash is present on his forearms bilaterally. He states that it is itchy and he has been taking benadryl to help with this. He also uses kenalog cream given in hospital without relief. On exam, no visible rash present to arms bilaterally, no erythema or raised lesions. Patient's wife has picture from 3/4 that shows papular, nonpustular rash that appears like urticaria.   ?A: ?Patient was given IV penicillin in hospital, initial rash could have been drug induced although would expect this to have improved and he has not had IV antibiotics in greater than 10 days. He denies new detergent or soap. His BUN is mildly elevated which might cause some pruritus, but picture of rash that appears like uriticaria does not fit. ?P: ?Continue benadryl PRN and topical kenalog cream ?Follow-up in 2 weeks ?

## 2021-04-23 NOTE — Progress Notes (Signed)
80 mg IV Lasix administered via PICC, pt tolerated well, given AVS and urinal, and d/c'd home per Darrick Grinder, NP ?

## 2021-04-23 NOTE — Assessment & Plan Note (Signed)
Patient blood culture grew E faecalis and s epi in 2/4 bottles. ID consulted and thought that his PICC line was likely colonized rather than true bacteremia. He was treated with >7 days IV antibiotics prior to discharge with plans to complete course with linezolid. Patient was not able to pick up linezolid until 3/8 and was off of antibiotics for 7 days. He denies fever, chills, or malaise. Secure chatted Dr. Earlene Plater with ID and he felt that duration of antibiotic did not need to be extended. ?P: ?Complete linezolid for 8 additional days, until 3/17 ?

## 2021-04-24 DIAGNOSIS — J45909 Unspecified asthma, uncomplicated: Secondary | ICD-10-CM | POA: Diagnosis not present

## 2021-04-24 DIAGNOSIS — I5022 Chronic systolic (congestive) heart failure: Secondary | ICD-10-CM | POA: Diagnosis not present

## 2021-04-24 DIAGNOSIS — T80212D Local infection due to central venous catheter, subsequent encounter: Secondary | ICD-10-CM | POA: Diagnosis not present

## 2021-04-24 DIAGNOSIS — J302 Other seasonal allergic rhinitis: Secondary | ICD-10-CM | POA: Diagnosis not present

## 2021-04-24 DIAGNOSIS — I130 Hypertensive heart and chronic kidney disease with heart failure and stage 1 through stage 4 chronic kidney disease, or unspecified chronic kidney disease: Secondary | ICD-10-CM | POA: Diagnosis not present

## 2021-04-24 DIAGNOSIS — E1142 Type 2 diabetes mellitus with diabetic polyneuropathy: Secondary | ICD-10-CM | POA: Diagnosis not present

## 2021-04-24 DIAGNOSIS — I4892 Unspecified atrial flutter: Secondary | ICD-10-CM | POA: Diagnosis not present

## 2021-04-24 DIAGNOSIS — J9691 Respiratory failure, unspecified with hypoxia: Secondary | ICD-10-CM | POA: Diagnosis not present

## 2021-04-24 DIAGNOSIS — F32A Depression, unspecified: Secondary | ICD-10-CM | POA: Diagnosis not present

## 2021-04-24 DIAGNOSIS — G4733 Obstructive sleep apnea (adult) (pediatric): Secondary | ICD-10-CM | POA: Diagnosis not present

## 2021-04-24 DIAGNOSIS — F419 Anxiety disorder, unspecified: Secondary | ICD-10-CM | POA: Diagnosis not present

## 2021-04-24 DIAGNOSIS — L304 Erythema intertrigo: Secondary | ICD-10-CM | POA: Diagnosis not present

## 2021-04-24 DIAGNOSIS — M545 Low back pain, unspecified: Secondary | ICD-10-CM | POA: Diagnosis not present

## 2021-04-24 DIAGNOSIS — Z794 Long term (current) use of insulin: Secondary | ICD-10-CM | POA: Diagnosis not present

## 2021-04-24 DIAGNOSIS — E669 Obesity, unspecified: Secondary | ICD-10-CM | POA: Diagnosis not present

## 2021-04-24 DIAGNOSIS — Z9981 Dependence on supplemental oxygen: Secondary | ICD-10-CM | POA: Diagnosis not present

## 2021-04-24 DIAGNOSIS — M14672 Charcot's joint, left ankle and foot: Secondary | ICD-10-CM | POA: Diagnosis not present

## 2021-04-24 DIAGNOSIS — I25119 Atherosclerotic heart disease of native coronary artery with unspecified angina pectoris: Secondary | ICD-10-CM | POA: Diagnosis not present

## 2021-04-24 NOTE — Progress Notes (Signed)
Internal Medicine Clinic Attending  Case discussed with Dr. Masters  At the time of the visit.  We reviewed the resident's history and exam and pertinent patient test results.  I agree with the assessment, diagnosis, and plan of care documented in the resident's note.  

## 2021-04-24 NOTE — Addendum Note (Signed)
Addended by: Dickie La on: 04/24/2021 10:07 AM ? ? Modules accepted: Level of Service ? ?

## 2021-04-25 DIAGNOSIS — I25119 Atherosclerotic heart disease of native coronary artery with unspecified angina pectoris: Secondary | ICD-10-CM | POA: Diagnosis not present

## 2021-04-25 DIAGNOSIS — E669 Obesity, unspecified: Secondary | ICD-10-CM | POA: Diagnosis not present

## 2021-04-25 DIAGNOSIS — F419 Anxiety disorder, unspecified: Secondary | ICD-10-CM | POA: Diagnosis not present

## 2021-04-25 DIAGNOSIS — J302 Other seasonal allergic rhinitis: Secondary | ICD-10-CM | POA: Diagnosis not present

## 2021-04-25 DIAGNOSIS — M14672 Charcot's joint, left ankle and foot: Secondary | ICD-10-CM | POA: Diagnosis not present

## 2021-04-25 DIAGNOSIS — I4892 Unspecified atrial flutter: Secondary | ICD-10-CM | POA: Diagnosis not present

## 2021-04-25 DIAGNOSIS — I5022 Chronic systolic (congestive) heart failure: Secondary | ICD-10-CM | POA: Diagnosis not present

## 2021-04-25 DIAGNOSIS — G4733 Obstructive sleep apnea (adult) (pediatric): Secondary | ICD-10-CM | POA: Diagnosis not present

## 2021-04-25 DIAGNOSIS — Z794 Long term (current) use of insulin: Secondary | ICD-10-CM | POA: Diagnosis not present

## 2021-04-25 DIAGNOSIS — T80212D Local infection due to central venous catheter, subsequent encounter: Secondary | ICD-10-CM | POA: Diagnosis not present

## 2021-04-25 DIAGNOSIS — J9691 Respiratory failure, unspecified with hypoxia: Secondary | ICD-10-CM | POA: Diagnosis not present

## 2021-04-25 DIAGNOSIS — E1142 Type 2 diabetes mellitus with diabetic polyneuropathy: Secondary | ICD-10-CM | POA: Diagnosis not present

## 2021-04-25 DIAGNOSIS — F32A Depression, unspecified: Secondary | ICD-10-CM | POA: Diagnosis not present

## 2021-04-25 DIAGNOSIS — J45909 Unspecified asthma, uncomplicated: Secondary | ICD-10-CM | POA: Diagnosis not present

## 2021-04-25 DIAGNOSIS — L304 Erythema intertrigo: Secondary | ICD-10-CM | POA: Diagnosis not present

## 2021-04-25 DIAGNOSIS — I130 Hypertensive heart and chronic kidney disease with heart failure and stage 1 through stage 4 chronic kidney disease, or unspecified chronic kidney disease: Secondary | ICD-10-CM | POA: Diagnosis not present

## 2021-04-25 DIAGNOSIS — Z9981 Dependence on supplemental oxygen: Secondary | ICD-10-CM | POA: Diagnosis not present

## 2021-04-25 DIAGNOSIS — M545 Low back pain, unspecified: Secondary | ICD-10-CM | POA: Diagnosis not present

## 2021-04-26 DIAGNOSIS — Z9981 Dependence on supplemental oxygen: Secondary | ICD-10-CM | POA: Diagnosis not present

## 2021-04-26 DIAGNOSIS — M545 Low back pain, unspecified: Secondary | ICD-10-CM | POA: Diagnosis not present

## 2021-04-26 DIAGNOSIS — F419 Anxiety disorder, unspecified: Secondary | ICD-10-CM | POA: Diagnosis not present

## 2021-04-26 DIAGNOSIS — M14672 Charcot's joint, left ankle and foot: Secondary | ICD-10-CM | POA: Diagnosis not present

## 2021-04-26 DIAGNOSIS — E669 Obesity, unspecified: Secondary | ICD-10-CM | POA: Diagnosis not present

## 2021-04-26 DIAGNOSIS — J9691 Respiratory failure, unspecified with hypoxia: Secondary | ICD-10-CM | POA: Diagnosis not present

## 2021-04-26 DIAGNOSIS — E1142 Type 2 diabetes mellitus with diabetic polyneuropathy: Secondary | ICD-10-CM | POA: Diagnosis not present

## 2021-04-26 DIAGNOSIS — J45909 Unspecified asthma, uncomplicated: Secondary | ICD-10-CM | POA: Diagnosis not present

## 2021-04-26 DIAGNOSIS — I5022 Chronic systolic (congestive) heart failure: Secondary | ICD-10-CM | POA: Diagnosis not present

## 2021-04-26 DIAGNOSIS — F32A Depression, unspecified: Secondary | ICD-10-CM | POA: Diagnosis not present

## 2021-04-26 DIAGNOSIS — J302 Other seasonal allergic rhinitis: Secondary | ICD-10-CM | POA: Diagnosis not present

## 2021-04-26 DIAGNOSIS — Z794 Long term (current) use of insulin: Secondary | ICD-10-CM | POA: Diagnosis not present

## 2021-04-26 DIAGNOSIS — I4892 Unspecified atrial flutter: Secondary | ICD-10-CM | POA: Diagnosis not present

## 2021-04-26 DIAGNOSIS — L304 Erythema intertrigo: Secondary | ICD-10-CM | POA: Diagnosis not present

## 2021-04-26 DIAGNOSIS — I130 Hypertensive heart and chronic kidney disease with heart failure and stage 1 through stage 4 chronic kidney disease, or unspecified chronic kidney disease: Secondary | ICD-10-CM | POA: Diagnosis not present

## 2021-04-26 DIAGNOSIS — T80212D Local infection due to central venous catheter, subsequent encounter: Secondary | ICD-10-CM | POA: Diagnosis not present

## 2021-04-26 DIAGNOSIS — G4733 Obstructive sleep apnea (adult) (pediatric): Secondary | ICD-10-CM | POA: Diagnosis not present

## 2021-04-26 DIAGNOSIS — I25119 Atherosclerotic heart disease of native coronary artery with unspecified angina pectoris: Secondary | ICD-10-CM | POA: Diagnosis not present

## 2021-04-27 DIAGNOSIS — F32A Depression, unspecified: Secondary | ICD-10-CM | POA: Diagnosis not present

## 2021-04-27 DIAGNOSIS — Z794 Long term (current) use of insulin: Secondary | ICD-10-CM | POA: Diagnosis not present

## 2021-04-27 DIAGNOSIS — I130 Hypertensive heart and chronic kidney disease with heart failure and stage 1 through stage 4 chronic kidney disease, or unspecified chronic kidney disease: Secondary | ICD-10-CM | POA: Diagnosis not present

## 2021-04-27 DIAGNOSIS — E669 Obesity, unspecified: Secondary | ICD-10-CM | POA: Diagnosis not present

## 2021-04-27 DIAGNOSIS — E1142 Type 2 diabetes mellitus with diabetic polyneuropathy: Secondary | ICD-10-CM | POA: Diagnosis not present

## 2021-04-27 DIAGNOSIS — M14672 Charcot's joint, left ankle and foot: Secondary | ICD-10-CM | POA: Diagnosis not present

## 2021-04-27 DIAGNOSIS — I4892 Unspecified atrial flutter: Secondary | ICD-10-CM | POA: Diagnosis not present

## 2021-04-27 DIAGNOSIS — F419 Anxiety disorder, unspecified: Secondary | ICD-10-CM | POA: Diagnosis not present

## 2021-04-27 DIAGNOSIS — G4733 Obstructive sleep apnea (adult) (pediatric): Secondary | ICD-10-CM | POA: Diagnosis not present

## 2021-04-27 DIAGNOSIS — J45909 Unspecified asthma, uncomplicated: Secondary | ICD-10-CM | POA: Diagnosis not present

## 2021-04-27 DIAGNOSIS — M545 Low back pain, unspecified: Secondary | ICD-10-CM | POA: Diagnosis not present

## 2021-04-27 DIAGNOSIS — L304 Erythema intertrigo: Secondary | ICD-10-CM | POA: Diagnosis not present

## 2021-04-27 DIAGNOSIS — J9691 Respiratory failure, unspecified with hypoxia: Secondary | ICD-10-CM | POA: Diagnosis not present

## 2021-04-27 DIAGNOSIS — Z9981 Dependence on supplemental oxygen: Secondary | ICD-10-CM | POA: Diagnosis not present

## 2021-04-27 DIAGNOSIS — I25119 Atherosclerotic heart disease of native coronary artery with unspecified angina pectoris: Secondary | ICD-10-CM | POA: Diagnosis not present

## 2021-04-27 DIAGNOSIS — J302 Other seasonal allergic rhinitis: Secondary | ICD-10-CM | POA: Diagnosis not present

## 2021-04-27 DIAGNOSIS — T80212D Local infection due to central venous catheter, subsequent encounter: Secondary | ICD-10-CM | POA: Diagnosis not present

## 2021-04-27 DIAGNOSIS — I5022 Chronic systolic (congestive) heart failure: Secondary | ICD-10-CM | POA: Diagnosis not present

## 2021-04-27 NOTE — Telephone Encounter (Signed)
YET  another pa for pt [facility] ) came through was submitted again with office notes and labs .. awaiting approval or denial again  ?

## 2021-04-28 DIAGNOSIS — F32A Depression, unspecified: Secondary | ICD-10-CM | POA: Diagnosis not present

## 2021-04-28 DIAGNOSIS — F419 Anxiety disorder, unspecified: Secondary | ICD-10-CM | POA: Diagnosis not present

## 2021-04-28 DIAGNOSIS — I130 Hypertensive heart and chronic kidney disease with heart failure and stage 1 through stage 4 chronic kidney disease, or unspecified chronic kidney disease: Secondary | ICD-10-CM | POA: Diagnosis not present

## 2021-04-28 DIAGNOSIS — I25119 Atherosclerotic heart disease of native coronary artery with unspecified angina pectoris: Secondary | ICD-10-CM | POA: Diagnosis not present

## 2021-04-28 DIAGNOSIS — M545 Low back pain, unspecified: Secondary | ICD-10-CM | POA: Diagnosis not present

## 2021-04-28 DIAGNOSIS — J302 Other seasonal allergic rhinitis: Secondary | ICD-10-CM | POA: Diagnosis not present

## 2021-04-28 DIAGNOSIS — L304 Erythema intertrigo: Secondary | ICD-10-CM | POA: Diagnosis not present

## 2021-04-28 DIAGNOSIS — E669 Obesity, unspecified: Secondary | ICD-10-CM | POA: Diagnosis not present

## 2021-04-28 DIAGNOSIS — T80212D Local infection due to central venous catheter, subsequent encounter: Secondary | ICD-10-CM | POA: Diagnosis not present

## 2021-04-28 DIAGNOSIS — J45909 Unspecified asthma, uncomplicated: Secondary | ICD-10-CM | POA: Diagnosis not present

## 2021-04-28 DIAGNOSIS — E1142 Type 2 diabetes mellitus with diabetic polyneuropathy: Secondary | ICD-10-CM | POA: Diagnosis not present

## 2021-04-28 DIAGNOSIS — Z794 Long term (current) use of insulin: Secondary | ICD-10-CM | POA: Diagnosis not present

## 2021-04-28 DIAGNOSIS — G4733 Obstructive sleep apnea (adult) (pediatric): Secondary | ICD-10-CM | POA: Diagnosis not present

## 2021-04-28 DIAGNOSIS — M14672 Charcot's joint, left ankle and foot: Secondary | ICD-10-CM | POA: Diagnosis not present

## 2021-04-28 DIAGNOSIS — Z9981 Dependence on supplemental oxygen: Secondary | ICD-10-CM | POA: Diagnosis not present

## 2021-04-28 DIAGNOSIS — I4892 Unspecified atrial flutter: Secondary | ICD-10-CM | POA: Diagnosis not present

## 2021-04-28 DIAGNOSIS — J9691 Respiratory failure, unspecified with hypoxia: Secondary | ICD-10-CM | POA: Diagnosis not present

## 2021-04-28 DIAGNOSIS — I5022 Chronic systolic (congestive) heart failure: Secondary | ICD-10-CM | POA: Diagnosis not present

## 2021-04-28 NOTE — Telephone Encounter (Signed)
DECISION : ? ? ? ?Message from Plan ?We received a prior authorization request for the member and product listed above. The Community and [facility] Prior Authorization Team is not able to review this request because it is a duplicate of a previous request and contains identical clinical information. Please note, this product was denied and decision letters were sent on 04/15/2021. Refer to the original case [id number] and if there is additional clinical information to add/review, please resubmit for further consideration. If there is not any new clinical information, please review the original decision letter for alternatives and/or next steps. ?

## 2021-04-29 ENCOUNTER — Encounter (HOSPITAL_COMMUNITY): Payer: Self-pay | Admitting: Internal Medicine

## 2021-04-29 ENCOUNTER — Other Ambulatory Visit: Payer: Self-pay

## 2021-04-29 ENCOUNTER — Ambulatory Visit (HOSPITAL_COMMUNITY)
Admission: RE | Admit: 2021-04-29 | Discharge: 2021-04-29 | Disposition: A | Discharge status: Home / Self Care | Payer: Medicaid Other | Source: Ambulatory Visit | Attending: Internal Medicine | Admitting: Internal Medicine

## 2021-04-29 ENCOUNTER — Other Ambulatory Visit (HOSPITAL_COMMUNITY): Payer: Self-pay

## 2021-04-29 VITALS — BP 108/70 | HR 108 | Wt 356.4 lb

## 2021-04-29 DIAGNOSIS — Z66 Do not resuscitate: Secondary | ICD-10-CM | POA: Diagnosis not present

## 2021-04-29 DIAGNOSIS — E1142 Type 2 diabetes mellitus with diabetic polyneuropathy: Secondary | ICD-10-CM | POA: Diagnosis not present

## 2021-04-29 DIAGNOSIS — G4733 Obstructive sleep apnea (adult) (pediatric): Secondary | ICD-10-CM | POA: Insufficient documentation

## 2021-04-29 DIAGNOSIS — T80212D Local infection due to central venous catheter, subsequent encounter: Secondary | ICD-10-CM | POA: Diagnosis not present

## 2021-04-29 DIAGNOSIS — Z6841 Body Mass Index (BMI) 40.0 and over, adult: Secondary | ICD-10-CM | POA: Insufficient documentation

## 2021-04-29 DIAGNOSIS — N1832 Chronic kidney disease, stage 3b: Secondary | ICD-10-CM | POA: Diagnosis not present

## 2021-04-29 DIAGNOSIS — I4892 Unspecified atrial flutter: Secondary | ICD-10-CM | POA: Insufficient documentation

## 2021-04-29 DIAGNOSIS — I5022 Chronic systolic (congestive) heart failure: Secondary | ICD-10-CM | POA: Insufficient documentation

## 2021-04-29 DIAGNOSIS — I4510 Unspecified right bundle-branch block: Secondary | ICD-10-CM | POA: Diagnosis not present

## 2021-04-29 DIAGNOSIS — B952 Enterococcus as the cause of diseases classified elsewhere: Secondary | ICD-10-CM | POA: Diagnosis not present

## 2021-04-29 DIAGNOSIS — L304 Erythema intertrigo: Secondary | ICD-10-CM | POA: Diagnosis not present

## 2021-04-29 DIAGNOSIS — N183 Chronic kidney disease, stage 3 unspecified: Secondary | ICD-10-CM

## 2021-04-29 DIAGNOSIS — Z79899 Other long term (current) drug therapy: Secondary | ICD-10-CM | POA: Diagnosis not present

## 2021-04-29 DIAGNOSIS — I130 Hypertensive heart and chronic kidney disease with heart failure and stage 1 through stage 4 chronic kidney disease, or unspecified chronic kidney disease: Secondary | ICD-10-CM | POA: Diagnosis not present

## 2021-04-29 DIAGNOSIS — Z7901 Long term (current) use of anticoagulants: Secondary | ICD-10-CM | POA: Insufficient documentation

## 2021-04-29 DIAGNOSIS — E1165 Type 2 diabetes mellitus with hyperglycemia: Secondary | ICD-10-CM | POA: Diagnosis not present

## 2021-04-29 DIAGNOSIS — I25119 Atherosclerotic heart disease of native coronary artery with unspecified angina pectoris: Secondary | ICD-10-CM | POA: Diagnosis not present

## 2021-04-29 DIAGNOSIS — E1122 Type 2 diabetes mellitus with diabetic chronic kidney disease: Secondary | ICD-10-CM | POA: Insufficient documentation

## 2021-04-29 DIAGNOSIS — E669 Obesity, unspecified: Secondary | ICD-10-CM | POA: Diagnosis not present

## 2021-04-29 DIAGNOSIS — F32A Depression, unspecified: Secondary | ICD-10-CM | POA: Diagnosis not present

## 2021-04-29 DIAGNOSIS — J45909 Unspecified asthma, uncomplicated: Secondary | ICD-10-CM | POA: Diagnosis not present

## 2021-04-29 DIAGNOSIS — Z794 Long term (current) use of insulin: Secondary | ICD-10-CM | POA: Diagnosis not present

## 2021-04-29 DIAGNOSIS — I428 Other cardiomyopathies: Secondary | ICD-10-CM | POA: Diagnosis not present

## 2021-04-29 DIAGNOSIS — M545 Low back pain, unspecified: Secondary | ICD-10-CM | POA: Diagnosis not present

## 2021-04-29 DIAGNOSIS — Z9981 Dependence on supplemental oxygen: Secondary | ICD-10-CM | POA: Diagnosis not present

## 2021-04-29 DIAGNOSIS — J9691 Respiratory failure, unspecified with hypoxia: Secondary | ICD-10-CM | POA: Diagnosis not present

## 2021-04-29 DIAGNOSIS — J302 Other seasonal allergic rhinitis: Secondary | ICD-10-CM | POA: Diagnosis not present

## 2021-04-29 DIAGNOSIS — F419 Anxiety disorder, unspecified: Secondary | ICD-10-CM | POA: Diagnosis not present

## 2021-04-29 DIAGNOSIS — M14672 Charcot's joint, left ankle and foot: Secondary | ICD-10-CM | POA: Diagnosis not present

## 2021-04-29 MED ORDER — METOLAZONE 2.5 MG PO TABS: 2.5000 mg | ORAL_TABLET | ORAL | 3 refills | Status: DC

## 2021-04-29 MED ORDER — POTASSIUM CHLORIDE CRYS ER 20 MEQ PO TBCR: 40.0000 meq | EXTENDED_RELEASE_TABLET | Freq: Two times a day (BID) | ORAL | 3 refills | Status: AC

## 2021-04-29 NOTE — Patient Instructions (Signed)
Medication Changes: ? ?Take Metolazone 2.5 mg EVERY Thursday ? ?When you take Metolazone take an extra 40 meq (2 tabs) of Potassium ? ?Lab Work: ? ?None ? ?Testing/Procedures: ? ?None ? ?Referrals: ? ?None ? ?Special Instructions // Education: ? ?Do the following things EVERYDAY: ?Weigh yourself in the morning before breakfast. Write it down and keep it in a log. ?Take your medicines as prescribed ?Eat low salt foods--Limit salt (sodium) to 2000 mg per day.  ?Stay as active as you can everyday ?Limit all fluids for the day to less than 2 liters ? ? ?Follow-Up in: 2-3 weeks ? ?At the [facility], you and your health needs are our priority. We have a designated team specialized in the treatment of Heart Failure. This Care Team includes your primary Heart Failure Specialized Cardiologist (physician), Advanced Practice Providers (APPs- Physician Assistants and Nurse Practitioners), and Pharmacist who all work together to provide you with the care you need, when you need it.  ? ?You may see any of the following providers on your designated Care Team at your next follow up: ? ?Dr Arvilla Meres ?Dr Marca Ancona ?Tonye Becket, NP ?Robbie Lis, PA ?Jessica Milford,NP ?Anna Genre, PA ?Karle Plumber, PharmD ? ? ?Please be sure to bring in all your medications bottles to every appointment.  ? ?Need to Contact us: ? ?If you have any questions or concerns before your next appointment please send [place] a message through [place] or call our office at [phone].   ? ?TO LEAVE A MESSAGE FOR THE NURSE SELECT OPTION 2, PLEASE LEAVE A MESSAGE INCLUDING: ?YOUR NAME ?DATE OF BIRTH ?CALL BACK NUMBER ?REASON FOR CALL**this is important as we prioritize the call backs ? ?YOU WILL RECEIVE A CALL BACK THE SAME DAY AS LONG AS YOU CALL BEFORE 4:00 PM ? ? ?

## 2021-04-29 NOTE — Progress Notes (Signed)
? ?ADVANCED HF CLINIC NOTE ? ?PCP: [name], MD ?HF Cardiologist: Dr. Haroldine Laws  ? ?HPI: ?Mr Biven is a 36 y.o.with a history of chronic HFrEF, NICM, HTN, asthma, OSA, morbid obesity, and uncontrolled DM. He is on home milrinone 0.375 mcg.  ?  ?Diagnosed with systolic HF in [place] [address]. EF 15-20% ? ?Repeat Echo 02/2017. EF had normalized to 55% but went back down when he was off HF meds. EF in 2021 back down to 30-35%. He has refused ICD.  ?  ?He has been followed in the HF clinic and was last seen 05/2020.  He cancelled follow up appointments 08/2020 and 09/2020.  ? ?Admitted 10/28/20 with A/C HFrEF, A fib RVR , and marked volume overload. Hospital course complicated by low output so milrinone started. Diuresed with lasix drip + metolazone+ diamox.   Failed milrinone wean and was discharged on milrinone. He was not a candidate for advanced therapies due to noncompliance and uncontrolled diabetes. HGB A1C >14, Palliative consulted for goals of care. He elected DNR/DNI. Referred to [facility] for Hospice services.  ? ?Seen in ED 11/25/20 with home milrinone pump issues.  ? ?Admitted 04/05/21 with sepsis and A/C HFrEF.  Had been on home milrinone through PICC. Blood CX Enterococcus faecalis and Staph epidermidis bacteremia. Placed on ceftriaxone + ampicllin. PICC removed. Diuresed with IV lasix.HF meds adjusted. PICC line replaced once cultures clear. Discharged on 2 weeks of linezolid. Discharged on milrinone 0.375 mcg. Plan to consider transplant if he can lose weight. Discharge weight 337 pounds. ? ?He was seen in the HF clinic last week and was given 80 mg IV lasix. Volume overloaded in the setting of high sodium diet.   ? ?Today he returns for HF follow up. Overall feeling fine. Having mild dyspnea with inclines. Denies PND/Orthopnea. Walking 30-45 minutes per day. Complaining RUE/elbow soreness. Says he slept on it. Appetite ok. No fever or chills. Weight at home 339-343 pounds. Taking all medications. Followed by  [facility]. Having issues with new inotrope pump.  ? ?Cardiac Studies ?- 02/2021 EF < 20% RV severely reduced.    ?- Echo 9/22: EF<20%, severely decreased LV, grade II DD.  ?- Echo 10/21: EF 30-35% with moderate RV dysfunction in setting of recurrent RBBB with significant dyssynchrony.  ?- Echo 04/2019: EF 30-35%  ?- Echo 2019: EF 55%   ?- Echo EF 15-20% in [place] in 2017. Cath around that time without  ? ?ROS: All systems negative except as listed in HPI, PMH and Problem List. ? ?SH:  ?Social History  ? ?Socioeconomic History  ? Marital status: Married  ?  Spouse name: Not on file  ? Number of children: Not on file  ? Years of education: Not on file  ? Highest education level: Not on file  ?Occupational History  ? Not on file  ?Tobacco Use  ? Smoking status: Never  ? Smokeless tobacco: Never  ?Vaping Use  ? Vaping Use: Never used  ?Substance and Sexual Activity  ? Alcohol use: No  ? Drug use: No  ? Sexual activity: Never  ?Other Topics Concern  ? Not on file  ?Social History Narrative  ? Not on file  ? ?Social Determinants of Health  ? ?Financial Resource Strain: Low Risk   ? Difficulty of Paying Living Expenses: Not very hard  ?Food Insecurity: No Food Insecurity  ? Worried About Charity fundraiser in the Last Year: Never true  ? Ran Out of Food in the Last Year:  Never true  ?Transportation Needs: No Transportation Needs  ? Lack of Transportation (Medical): No  ? Lack of Transportation (Non-Medical): No  ?Physical Activity: Not on file  ?Stress: Not on file  ?Social Connections: Not on file  ?Intimate Partner Violence: Not on file  ? ?FH:  ?Family History  ?Problem Relation Age of Onset  ? Diabetes Mellitus II Mother   ? Heart failure Mother   ? Hypertension Mother   ? Heart disease Mother   ? Heart failure Father   ? Kidney disease Father   ? Heart disease Father   ? Congestive Heart Failure Neg Hx   ? ?Past Medical History:  ?Diagnosis Date  ? Acute exacerbation of CHF (congestive heart failure) ([place]) 04/05/2021  ?  Acute on chronic heart failure ([place]) 10/28/2020  ? Acute pain of left foot 05/03/2017  ? Asthma   ? Asthma, chronic, mild persistent, uncomplicated 02/17/7251  ? Charcot ankle, left 05/31/2017  ? CHF (congestive heart failure) ([place])   ? Chronic systolic (congestive) heart failure (HCC)   ? Diabetes mellitus ([place])   ? Diabetes mellitus type 2 in obese ([place]) 04/21/2014  ? Diabetic polyneuropathy associated with type 2 diabetes mellitus ([place]) 03/14/2017  ? Essential hypertension 10/26/2016  ? Hypertension   ? Obesity   ? Obesity, Class III, BMI 40-49.9 (morbid obesity) ([place]) 10/26/2016  ? OSA (obstructive sleep apnea) 12/02/2016  ? Type 2 diabetes mellitus with diabetic neuropathy (HCC)   ? ?Current Outpatient Medications  ?Medication Sig Dispense Refill  ? Accu-Chek Softclix Lancets lancets Check blood sugar 3x a day 100 each 11  ? albuterol (VENTOLIN HFA) 108 (90 Base) MCG/ACT inhaler Inhale 2 puffs into the lungs every 4 (four) hours as needed for wheezing or shortness of breath. 18 g 0  ? amiodarone (PACERONE) 200 MG tablet Take 0.5 tablets (100 mg total) by mouth daily. 15 tablet 2  ? apixaban (ELIQUIS) 5 MG TABS tablet Take 1 tablet (5 mg total) by mouth 2 (two) times daily. 60 tablet 2  ? azelastine (ASTELIN) 0.1 % nasal spray Place into both nostrils as needed for rhinitis. Use in each nostril as directed    ? blood glucose meter kit and supplies KIT Dispense based on patient and insurance preference. Use up to four times daily as directed. (FOR ICD-9 250.00, 250.01). For QAC - HS accuchecks. May switch to any brand. 1 each 1  ? Continuous Blood Gluc Sensor (FREESTYLE LIBRE 2 SENSOR) MISC Use to check blood sugar at least 6 times a day 2 each 11  ? [employer] SENNA 8.6 MG tablet Take 1 tablet by mouth daily as needed for constipation.    ? cyclobenzaprine (FLEXERIL) 5 MG tablet Take 5 mg by mouth every 6 (six) hours as needed for muscle spasms.    ? digoxin (LANOXIN) 0.125 MG tablet TAKE 1 TABLET BY MOUTH EVERY DAY 90  tablet 3  ? gabapentin (NEURONTIN) 300 MG capsule Take 300 mg by mouth as needed (pain).    ? glucose blood (ACCU-CHEK GUIDE) test strip Check blood sugar 3 times per day 100 each 11  ? hyoscyamine (LEVSIN SL) 0.125 MG SL tablet Place 0.125 mg under the tongue every 4 (four) hours as needed for cramping (secretions).    ? insulin aspart (NOVOLOG) 100 UNIT/ML injection Inject 20 Units into the skin 3 (three) times daily with meals. 20 mL 11  ? insulin glargine (LANTUS) 100 UNIT/ML injection Inject 0.3 mLs (30 Units total) into the skin 2 (  two) times daily. 20 mL 11  ? Insulin Syringe-Needle U-100 30G X 1/2" 1 ML MISC Use to inject insulin 4 times a day. 100 each 2  ? isosorbide-hydrALAZINE (BIDIL) 20-37.5 MG tablet Take 1 tablet by mouth 3 (three) times daily. 90 tablet 2  ? linezolid (ZYVOX) 600 MG tablet Take 1 tablet (600 mg total) by mouth every 12 (twelve) hours for 9 days. 18 tablet 0  ? loratadine (CLARITIN) 10 MG tablet Take 1 tablet (10 mg total) by mouth 2 (two) times daily as needed for allergies (Can use an extra dose during flare ups.). 60 tablet 5  ? metolazone (ZAROXOLYN) 2.5 MG tablet Take 2.5 mg by mouth as needed.    ? milrinone (PRIMACOR) 20 MG/100 ML SOLN infusion Inject 0.0596 mg/min into the vein continuous. 100 mL 0  ? nitroGLYCERIN (NITROSTAT) 0.3 MG SL tablet Place 0.3 mg under the tongue every 5 (five) minutes as needed for chest pain.    ? ondansetron (ZOFRAN) 4 MG tablet Take 4 mg by mouth daily as needed for nausea or vomiting.    ? potassium chloride SA (KLOR-CON M) 20 MEQ tablet Take 2 tablets (40 mEq total) by mouth 2 (two) times daily. 120 tablet 0  ? sacubitril-valsartan (ENTRESTO) 24-26 MG Take 1 tablet by mouth 2 (two) times daily. 60 tablet 0  ? spironolactone (ALDACTONE) 25 MG tablet Take 1 tablet (25 mg total) by mouth daily. 30 tablet 0  ? torsemide (DEMADEX) 20 MG tablet Take 4 tablets (80 mg total) by mouth 2 (two) times daily. 240 tablet 0  ? traMADol (ULTRAM) 50 MG tablet  TAKE 1 TABLET BY MOUTH EVERY 6 HOURS AS NEEDED. 20 tablet 0  ? tirzepatide (MOUNJARO) 10 MG/0.5ML Pen Inject 10 mg into the skin once a week. (Patient not taking: Reported on 04/29/2021) 2 mL 0  ? ?No

## 2021-04-30 DIAGNOSIS — Z9981 Dependence on supplemental oxygen: Secondary | ICD-10-CM | POA: Diagnosis not present

## 2021-04-30 DIAGNOSIS — J9691 Respiratory failure, unspecified with hypoxia: Secondary | ICD-10-CM | POA: Diagnosis not present

## 2021-04-30 DIAGNOSIS — Z794 Long term (current) use of insulin: Secondary | ICD-10-CM | POA: Diagnosis not present

## 2021-04-30 DIAGNOSIS — I25119 Atherosclerotic heart disease of native coronary artery with unspecified angina pectoris: Secondary | ICD-10-CM | POA: Diagnosis not present

## 2021-04-30 DIAGNOSIS — E1142 Type 2 diabetes mellitus with diabetic polyneuropathy: Secondary | ICD-10-CM | POA: Diagnosis not present

## 2021-04-30 DIAGNOSIS — E669 Obesity, unspecified: Secondary | ICD-10-CM | POA: Diagnosis not present

## 2021-04-30 DIAGNOSIS — L304 Erythema intertrigo: Secondary | ICD-10-CM | POA: Diagnosis not present

## 2021-04-30 DIAGNOSIS — M14672 Charcot's joint, left ankle and foot: Secondary | ICD-10-CM | POA: Diagnosis not present

## 2021-04-30 DIAGNOSIS — J302 Other seasonal allergic rhinitis: Secondary | ICD-10-CM | POA: Diagnosis not present

## 2021-04-30 DIAGNOSIS — F419 Anxiety disorder, unspecified: Secondary | ICD-10-CM | POA: Diagnosis not present

## 2021-04-30 DIAGNOSIS — I5022 Chronic systolic (congestive) heart failure: Secondary | ICD-10-CM | POA: Diagnosis not present

## 2021-04-30 DIAGNOSIS — G4733 Obstructive sleep apnea (adult) (pediatric): Secondary | ICD-10-CM | POA: Diagnosis not present

## 2021-04-30 DIAGNOSIS — I4892 Unspecified atrial flutter: Secondary | ICD-10-CM | POA: Diagnosis not present

## 2021-04-30 DIAGNOSIS — J45909 Unspecified asthma, uncomplicated: Secondary | ICD-10-CM | POA: Diagnosis not present

## 2021-04-30 DIAGNOSIS — M545 Low back pain, unspecified: Secondary | ICD-10-CM | POA: Diagnosis not present

## 2021-04-30 DIAGNOSIS — I130 Hypertensive heart and chronic kidney disease with heart failure and stage 1 through stage 4 chronic kidney disease, or unspecified chronic kidney disease: Secondary | ICD-10-CM | POA: Diagnosis not present

## 2021-04-30 DIAGNOSIS — T80212D Local infection due to central venous catheter, subsequent encounter: Secondary | ICD-10-CM | POA: Diagnosis not present

## 2021-04-30 DIAGNOSIS — F32A Depression, unspecified: Secondary | ICD-10-CM | POA: Diagnosis not present

## 2021-05-01 ENCOUNTER — Telehealth: Payer: Self-pay

## 2021-05-01 DIAGNOSIS — I130 Hypertensive heart and chronic kidney disease with heart failure and stage 1 through stage 4 chronic kidney disease, or unspecified chronic kidney disease: Secondary | ICD-10-CM | POA: Diagnosis not present

## 2021-05-01 DIAGNOSIS — T80212D Local infection due to central venous catheter, subsequent encounter: Secondary | ICD-10-CM | POA: Diagnosis not present

## 2021-05-01 DIAGNOSIS — E669 Obesity, unspecified: Secondary | ICD-10-CM | POA: Diagnosis not present

## 2021-05-01 DIAGNOSIS — I5022 Chronic systolic (congestive) heart failure: Secondary | ICD-10-CM | POA: Diagnosis not present

## 2021-05-01 DIAGNOSIS — F32A Depression, unspecified: Secondary | ICD-10-CM | POA: Diagnosis not present

## 2021-05-01 DIAGNOSIS — J9691 Respiratory failure, unspecified with hypoxia: Secondary | ICD-10-CM | POA: Diagnosis not present

## 2021-05-01 DIAGNOSIS — Z9981 Dependence on supplemental oxygen: Secondary | ICD-10-CM | POA: Diagnosis not present

## 2021-05-01 DIAGNOSIS — L304 Erythema intertrigo: Secondary | ICD-10-CM | POA: Diagnosis not present

## 2021-05-01 DIAGNOSIS — J45909 Unspecified asthma, uncomplicated: Secondary | ICD-10-CM | POA: Diagnosis not present

## 2021-05-01 DIAGNOSIS — F419 Anxiety disorder, unspecified: Secondary | ICD-10-CM | POA: Diagnosis not present

## 2021-05-01 DIAGNOSIS — J302 Other seasonal allergic rhinitis: Secondary | ICD-10-CM | POA: Diagnosis not present

## 2021-05-01 DIAGNOSIS — M14672 Charcot's joint, left ankle and foot: Secondary | ICD-10-CM | POA: Diagnosis not present

## 2021-05-01 DIAGNOSIS — Z794 Long term (current) use of insulin: Secondary | ICD-10-CM | POA: Diagnosis not present

## 2021-05-01 DIAGNOSIS — I25119 Atherosclerotic heart disease of native coronary artery with unspecified angina pectoris: Secondary | ICD-10-CM | POA: Diagnosis not present

## 2021-05-01 DIAGNOSIS — M545 Low back pain, unspecified: Secondary | ICD-10-CM | POA: Diagnosis not present

## 2021-05-01 DIAGNOSIS — E1142 Type 2 diabetes mellitus with diabetic polyneuropathy: Secondary | ICD-10-CM | POA: Diagnosis not present

## 2021-05-01 DIAGNOSIS — G4733 Obstructive sleep apnea (adult) (pediatric): Secondary | ICD-10-CM | POA: Diagnosis not present

## 2021-05-01 DIAGNOSIS — I4892 Unspecified atrial flutter: Secondary | ICD-10-CM | POA: Diagnosis not present

## 2021-05-01 NOTE — Telephone Encounter (Signed)
Call to pt reference next PREP class at [facility] M/W [phone] ?Can do that schedule  ?Intake scheduled for 3/22 at 9am at [facility]  ?

## 2021-05-02 ENCOUNTER — Other Ambulatory Visit (HOSPITAL_COMMUNITY): Payer: Self-pay | Admitting: Family Medicine

## 2021-05-02 ENCOUNTER — Other Ambulatory Visit (HOSPITAL_COMMUNITY): Payer: Self-pay | Admitting: Physician Assistant

## 2021-05-02 DIAGNOSIS — E669 Obesity, unspecified: Secondary | ICD-10-CM | POA: Diagnosis not present

## 2021-05-02 DIAGNOSIS — Z9981 Dependence on supplemental oxygen: Secondary | ICD-10-CM | POA: Diagnosis not present

## 2021-05-02 DIAGNOSIS — I5022 Chronic systolic (congestive) heart failure: Secondary | ICD-10-CM | POA: Diagnosis not present

## 2021-05-02 DIAGNOSIS — J302 Other seasonal allergic rhinitis: Secondary | ICD-10-CM | POA: Diagnosis not present

## 2021-05-02 DIAGNOSIS — E1142 Type 2 diabetes mellitus with diabetic polyneuropathy: Secondary | ICD-10-CM | POA: Diagnosis not present

## 2021-05-02 DIAGNOSIS — T80212D Local infection due to central venous catheter, subsequent encounter: Secondary | ICD-10-CM | POA: Diagnosis not present

## 2021-05-02 DIAGNOSIS — I4892 Unspecified atrial flutter: Secondary | ICD-10-CM | POA: Diagnosis not present

## 2021-05-02 DIAGNOSIS — I25119 Atherosclerotic heart disease of native coronary artery with unspecified angina pectoris: Secondary | ICD-10-CM | POA: Diagnosis not present

## 2021-05-02 DIAGNOSIS — F419 Anxiety disorder, unspecified: Secondary | ICD-10-CM | POA: Diagnosis not present

## 2021-05-02 DIAGNOSIS — F32A Depression, unspecified: Secondary | ICD-10-CM | POA: Diagnosis not present

## 2021-05-02 DIAGNOSIS — M14672 Charcot's joint, left ankle and foot: Secondary | ICD-10-CM | POA: Diagnosis not present

## 2021-05-02 DIAGNOSIS — I130 Hypertensive heart and chronic kidney disease with heart failure and stage 1 through stage 4 chronic kidney disease, or unspecified chronic kidney disease: Secondary | ICD-10-CM | POA: Diagnosis not present

## 2021-05-02 DIAGNOSIS — J45909 Unspecified asthma, uncomplicated: Secondary | ICD-10-CM | POA: Diagnosis not present

## 2021-05-02 DIAGNOSIS — J9691 Respiratory failure, unspecified with hypoxia: Secondary | ICD-10-CM | POA: Diagnosis not present

## 2021-05-02 DIAGNOSIS — Z794 Long term (current) use of insulin: Secondary | ICD-10-CM | POA: Diagnosis not present

## 2021-05-02 DIAGNOSIS — L304 Erythema intertrigo: Secondary | ICD-10-CM | POA: Diagnosis not present

## 2021-05-02 DIAGNOSIS — M545 Low back pain, unspecified: Secondary | ICD-10-CM | POA: Diagnosis not present

## 2021-05-02 DIAGNOSIS — G4733 Obstructive sleep apnea (adult) (pediatric): Secondary | ICD-10-CM | POA: Diagnosis not present

## 2021-05-03 DIAGNOSIS — I4892 Unspecified atrial flutter: Secondary | ICD-10-CM | POA: Diagnosis not present

## 2021-05-03 DIAGNOSIS — G4733 Obstructive sleep apnea (adult) (pediatric): Secondary | ICD-10-CM | POA: Diagnosis not present

## 2021-05-03 DIAGNOSIS — Z794 Long term (current) use of insulin: Secondary | ICD-10-CM | POA: Diagnosis not present

## 2021-05-03 DIAGNOSIS — J302 Other seasonal allergic rhinitis: Secondary | ICD-10-CM | POA: Diagnosis not present

## 2021-05-03 DIAGNOSIS — F32A Depression, unspecified: Secondary | ICD-10-CM | POA: Diagnosis not present

## 2021-05-03 DIAGNOSIS — M545 Low back pain, unspecified: Secondary | ICD-10-CM | POA: Diagnosis not present

## 2021-05-03 DIAGNOSIS — E669 Obesity, unspecified: Secondary | ICD-10-CM | POA: Diagnosis not present

## 2021-05-03 DIAGNOSIS — M14672 Charcot's joint, left ankle and foot: Secondary | ICD-10-CM | POA: Diagnosis not present

## 2021-05-03 DIAGNOSIS — J9691 Respiratory failure, unspecified with hypoxia: Secondary | ICD-10-CM | POA: Diagnosis not present

## 2021-05-03 DIAGNOSIS — I130 Hypertensive heart and chronic kidney disease with heart failure and stage 1 through stage 4 chronic kidney disease, or unspecified chronic kidney disease: Secondary | ICD-10-CM | POA: Diagnosis not present

## 2021-05-03 DIAGNOSIS — I5022 Chronic systolic (congestive) heart failure: Secondary | ICD-10-CM | POA: Diagnosis not present

## 2021-05-03 DIAGNOSIS — F419 Anxiety disorder, unspecified: Secondary | ICD-10-CM | POA: Diagnosis not present

## 2021-05-03 DIAGNOSIS — L304 Erythema intertrigo: Secondary | ICD-10-CM | POA: Diagnosis not present

## 2021-05-03 DIAGNOSIS — Z9981 Dependence on supplemental oxygen: Secondary | ICD-10-CM | POA: Diagnosis not present

## 2021-05-03 DIAGNOSIS — T80212D Local infection due to central venous catheter, subsequent encounter: Secondary | ICD-10-CM | POA: Diagnosis not present

## 2021-05-03 DIAGNOSIS — I25119 Atherosclerotic heart disease of native coronary artery with unspecified angina pectoris: Secondary | ICD-10-CM | POA: Diagnosis not present

## 2021-05-03 DIAGNOSIS — E1142 Type 2 diabetes mellitus with diabetic polyneuropathy: Secondary | ICD-10-CM | POA: Diagnosis not present

## 2021-05-03 DIAGNOSIS — J45909 Unspecified asthma, uncomplicated: Secondary | ICD-10-CM | POA: Diagnosis not present

## 2021-05-04 DIAGNOSIS — I25119 Atherosclerotic heart disease of native coronary artery with unspecified angina pectoris: Secondary | ICD-10-CM | POA: Diagnosis not present

## 2021-05-04 DIAGNOSIS — M545 Low back pain, unspecified: Secondary | ICD-10-CM | POA: Diagnosis not present

## 2021-05-04 DIAGNOSIS — T80212D Local infection due to central venous catheter, subsequent encounter: Secondary | ICD-10-CM | POA: Diagnosis not present

## 2021-05-04 DIAGNOSIS — F419 Anxiety disorder, unspecified: Secondary | ICD-10-CM | POA: Diagnosis not present

## 2021-05-04 DIAGNOSIS — G4733 Obstructive sleep apnea (adult) (pediatric): Secondary | ICD-10-CM | POA: Diagnosis not present

## 2021-05-04 DIAGNOSIS — M14672 Charcot's joint, left ankle and foot: Secondary | ICD-10-CM | POA: Diagnosis not present

## 2021-05-04 DIAGNOSIS — F32A Depression, unspecified: Secondary | ICD-10-CM | POA: Diagnosis not present

## 2021-05-04 DIAGNOSIS — J9691 Respiratory failure, unspecified with hypoxia: Secondary | ICD-10-CM | POA: Diagnosis not present

## 2021-05-04 DIAGNOSIS — I5022 Chronic systolic (congestive) heart failure: Secondary | ICD-10-CM | POA: Diagnosis not present

## 2021-05-04 DIAGNOSIS — I4892 Unspecified atrial flutter: Secondary | ICD-10-CM | POA: Diagnosis not present

## 2021-05-04 DIAGNOSIS — J302 Other seasonal allergic rhinitis: Secondary | ICD-10-CM | POA: Diagnosis not present

## 2021-05-04 DIAGNOSIS — J45909 Unspecified asthma, uncomplicated: Secondary | ICD-10-CM | POA: Diagnosis not present

## 2021-05-04 DIAGNOSIS — Z794 Long term (current) use of insulin: Secondary | ICD-10-CM | POA: Diagnosis not present

## 2021-05-04 DIAGNOSIS — E1142 Type 2 diabetes mellitus with diabetic polyneuropathy: Secondary | ICD-10-CM | POA: Diagnosis not present

## 2021-05-04 DIAGNOSIS — Z9981 Dependence on supplemental oxygen: Secondary | ICD-10-CM | POA: Diagnosis not present

## 2021-05-04 DIAGNOSIS — L304 Erythema intertrigo: Secondary | ICD-10-CM | POA: Diagnosis not present

## 2021-05-04 DIAGNOSIS — E669 Obesity, unspecified: Secondary | ICD-10-CM | POA: Diagnosis not present

## 2021-05-04 DIAGNOSIS — I130 Hypertensive heart and chronic kidney disease with heart failure and stage 1 through stage 4 chronic kidney disease, or unspecified chronic kidney disease: Secondary | ICD-10-CM | POA: Diagnosis not present

## 2021-05-05 ENCOUNTER — Telehealth: Payer: Self-pay

## 2021-05-05 DIAGNOSIS — E1142 Type 2 diabetes mellitus with diabetic polyneuropathy: Secondary | ICD-10-CM | POA: Diagnosis not present

## 2021-05-05 DIAGNOSIS — I5022 Chronic systolic (congestive) heart failure: Secondary | ICD-10-CM | POA: Diagnosis not present

## 2021-05-05 DIAGNOSIS — Z9981 Dependence on supplemental oxygen: Secondary | ICD-10-CM | POA: Diagnosis not present

## 2021-05-05 DIAGNOSIS — J302 Other seasonal allergic rhinitis: Secondary | ICD-10-CM | POA: Diagnosis not present

## 2021-05-05 DIAGNOSIS — L304 Erythema intertrigo: Secondary | ICD-10-CM | POA: Diagnosis not present

## 2021-05-05 DIAGNOSIS — F32A Depression, unspecified: Secondary | ICD-10-CM | POA: Diagnosis not present

## 2021-05-05 DIAGNOSIS — T80212D Local infection due to central venous catheter, subsequent encounter: Secondary | ICD-10-CM | POA: Diagnosis not present

## 2021-05-05 DIAGNOSIS — M545 Low back pain, unspecified: Secondary | ICD-10-CM | POA: Diagnosis not present

## 2021-05-05 DIAGNOSIS — J9691 Respiratory failure, unspecified with hypoxia: Secondary | ICD-10-CM | POA: Diagnosis not present

## 2021-05-05 DIAGNOSIS — I4892 Unspecified atrial flutter: Secondary | ICD-10-CM | POA: Diagnosis not present

## 2021-05-05 DIAGNOSIS — M14672 Charcot's joint, left ankle and foot: Secondary | ICD-10-CM | POA: Diagnosis not present

## 2021-05-05 DIAGNOSIS — E669 Obesity, unspecified: Secondary | ICD-10-CM | POA: Diagnosis not present

## 2021-05-05 DIAGNOSIS — Z794 Long term (current) use of insulin: Secondary | ICD-10-CM | POA: Diagnosis not present

## 2021-05-05 DIAGNOSIS — I130 Hypertensive heart and chronic kidney disease with heart failure and stage 1 through stage 4 chronic kidney disease, or unspecified chronic kidney disease: Secondary | ICD-10-CM | POA: Diagnosis not present

## 2021-05-05 DIAGNOSIS — G4733 Obstructive sleep apnea (adult) (pediatric): Secondary | ICD-10-CM | POA: Diagnosis not present

## 2021-05-05 DIAGNOSIS — F419 Anxiety disorder, unspecified: Secondary | ICD-10-CM | POA: Diagnosis not present

## 2021-05-05 DIAGNOSIS — J45909 Unspecified asthma, uncomplicated: Secondary | ICD-10-CM | POA: Diagnosis not present

## 2021-05-05 DIAGNOSIS — I25119 Atherosclerotic heart disease of native coronary artery with unspecified angina pectoris: Secondary | ICD-10-CM | POA: Diagnosis not present

## 2021-05-05 NOTE — Telephone Encounter (Signed)
[facility] pt reference starting PREP and appt tomorrow for intake ?Unable to come due to lack of transport.  ?Returned call to pt  ?Has applied for [facility] but hasn't heard anything from them so doesn't have transport for appt tomorrow or for class.  ?Okay to reach to SW for ideas ?Messaged SW for transportation options.  ?

## 2021-05-06 ENCOUNTER — Other Ambulatory Visit (HOSPITAL_COMMUNITY): Payer: Self-pay | Admitting: Internal Medicine

## 2021-05-06 DIAGNOSIS — Z794 Long term (current) use of insulin: Secondary | ICD-10-CM | POA: Diagnosis not present

## 2021-05-06 DIAGNOSIS — J302 Other seasonal allergic rhinitis: Secondary | ICD-10-CM | POA: Diagnosis not present

## 2021-05-06 DIAGNOSIS — L304 Erythema intertrigo: Secondary | ICD-10-CM | POA: Diagnosis not present

## 2021-05-06 DIAGNOSIS — T80212D Local infection due to central venous catheter, subsequent encounter: Secondary | ICD-10-CM | POA: Diagnosis not present

## 2021-05-06 DIAGNOSIS — E669 Obesity, unspecified: Secondary | ICD-10-CM | POA: Diagnosis not present

## 2021-05-06 DIAGNOSIS — I4892 Unspecified atrial flutter: Secondary | ICD-10-CM | POA: Diagnosis not present

## 2021-05-06 DIAGNOSIS — I130 Hypertensive heart and chronic kidney disease with heart failure and stage 1 through stage 4 chronic kidney disease, or unspecified chronic kidney disease: Secondary | ICD-10-CM | POA: Diagnosis not present

## 2021-05-06 DIAGNOSIS — M14672 Charcot's joint, left ankle and foot: Secondary | ICD-10-CM | POA: Diagnosis not present

## 2021-05-06 DIAGNOSIS — G4733 Obstructive sleep apnea (adult) (pediatric): Secondary | ICD-10-CM | POA: Diagnosis not present

## 2021-05-06 DIAGNOSIS — E1142 Type 2 diabetes mellitus with diabetic polyneuropathy: Secondary | ICD-10-CM | POA: Diagnosis not present

## 2021-05-06 DIAGNOSIS — M545 Low back pain, unspecified: Secondary | ICD-10-CM | POA: Diagnosis not present

## 2021-05-06 DIAGNOSIS — F32A Depression, unspecified: Secondary | ICD-10-CM | POA: Diagnosis not present

## 2021-05-06 DIAGNOSIS — J45909 Unspecified asthma, uncomplicated: Secondary | ICD-10-CM | POA: Diagnosis not present

## 2021-05-06 DIAGNOSIS — F419 Anxiety disorder, unspecified: Secondary | ICD-10-CM | POA: Diagnosis not present

## 2021-05-06 DIAGNOSIS — Z9981 Dependence on supplemental oxygen: Secondary | ICD-10-CM | POA: Diagnosis not present

## 2021-05-06 DIAGNOSIS — I25119 Atherosclerotic heart disease of native coronary artery with unspecified angina pectoris: Secondary | ICD-10-CM | POA: Diagnosis not present

## 2021-05-06 DIAGNOSIS — J9691 Respiratory failure, unspecified with hypoxia: Secondary | ICD-10-CM | POA: Diagnosis not present

## 2021-05-06 DIAGNOSIS — I5022 Chronic systolic (congestive) heart failure: Secondary | ICD-10-CM | POA: Diagnosis not present

## 2021-05-07 DIAGNOSIS — I25119 Atherosclerotic heart disease of native coronary artery with unspecified angina pectoris: Secondary | ICD-10-CM | POA: Diagnosis not present

## 2021-05-07 DIAGNOSIS — G4733 Obstructive sleep apnea (adult) (pediatric): Secondary | ICD-10-CM | POA: Diagnosis not present

## 2021-05-07 DIAGNOSIS — F32A Depression, unspecified: Secondary | ICD-10-CM | POA: Diagnosis not present

## 2021-05-07 DIAGNOSIS — F419 Anxiety disorder, unspecified: Secondary | ICD-10-CM | POA: Diagnosis not present

## 2021-05-07 DIAGNOSIS — L304 Erythema intertrigo: Secondary | ICD-10-CM | POA: Diagnosis not present

## 2021-05-07 DIAGNOSIS — M545 Low back pain, unspecified: Secondary | ICD-10-CM | POA: Diagnosis not present

## 2021-05-07 DIAGNOSIS — I4892 Unspecified atrial flutter: Secondary | ICD-10-CM | POA: Diagnosis not present

## 2021-05-07 DIAGNOSIS — J45909 Unspecified asthma, uncomplicated: Secondary | ICD-10-CM | POA: Diagnosis not present

## 2021-05-07 DIAGNOSIS — T80212D Local infection due to central venous catheter, subsequent encounter: Secondary | ICD-10-CM | POA: Diagnosis not present

## 2021-05-07 DIAGNOSIS — J9691 Respiratory failure, unspecified with hypoxia: Secondary | ICD-10-CM | POA: Diagnosis not present

## 2021-05-07 DIAGNOSIS — E669 Obesity, unspecified: Secondary | ICD-10-CM | POA: Diagnosis not present

## 2021-05-07 DIAGNOSIS — I5022 Chronic systolic (congestive) heart failure: Secondary | ICD-10-CM | POA: Diagnosis not present

## 2021-05-07 DIAGNOSIS — J302 Other seasonal allergic rhinitis: Secondary | ICD-10-CM | POA: Diagnosis not present

## 2021-05-07 DIAGNOSIS — M14672 Charcot's joint, left ankle and foot: Secondary | ICD-10-CM | POA: Diagnosis not present

## 2021-05-07 DIAGNOSIS — Z794 Long term (current) use of insulin: Secondary | ICD-10-CM | POA: Diagnosis not present

## 2021-05-07 DIAGNOSIS — I130 Hypertensive heart and chronic kidney disease with heart failure and stage 1 through stage 4 chronic kidney disease, or unspecified chronic kidney disease: Secondary | ICD-10-CM | POA: Diagnosis not present

## 2021-05-07 DIAGNOSIS — E1142 Type 2 diabetes mellitus with diabetic polyneuropathy: Secondary | ICD-10-CM | POA: Diagnosis not present

## 2021-05-07 DIAGNOSIS — Z9981 Dependence on supplemental oxygen: Secondary | ICD-10-CM | POA: Diagnosis not present

## 2021-05-08 ENCOUNTER — Telehealth: Payer: Self-pay

## 2021-05-08 DIAGNOSIS — J9691 Respiratory failure, unspecified with hypoxia: Secondary | ICD-10-CM | POA: Diagnosis not present

## 2021-05-08 DIAGNOSIS — F419 Anxiety disorder, unspecified: Secondary | ICD-10-CM | POA: Diagnosis not present

## 2021-05-08 DIAGNOSIS — I130 Hypertensive heart and chronic kidney disease with heart failure and stage 1 through stage 4 chronic kidney disease, or unspecified chronic kidney disease: Secondary | ICD-10-CM | POA: Diagnosis not present

## 2021-05-08 DIAGNOSIS — M14672 Charcot's joint, left ankle and foot: Secondary | ICD-10-CM | POA: Diagnosis not present

## 2021-05-08 DIAGNOSIS — G4733 Obstructive sleep apnea (adult) (pediatric): Secondary | ICD-10-CM | POA: Diagnosis not present

## 2021-05-08 DIAGNOSIS — Z9981 Dependence on supplemental oxygen: Secondary | ICD-10-CM | POA: Diagnosis not present

## 2021-05-08 DIAGNOSIS — M545 Low back pain, unspecified: Secondary | ICD-10-CM | POA: Diagnosis not present

## 2021-05-08 DIAGNOSIS — J45909 Unspecified asthma, uncomplicated: Secondary | ICD-10-CM | POA: Diagnosis not present

## 2021-05-08 DIAGNOSIS — E1142 Type 2 diabetes mellitus with diabetic polyneuropathy: Secondary | ICD-10-CM | POA: Diagnosis not present

## 2021-05-08 DIAGNOSIS — Z794 Long term (current) use of insulin: Secondary | ICD-10-CM | POA: Diagnosis not present

## 2021-05-08 DIAGNOSIS — I4892 Unspecified atrial flutter: Secondary | ICD-10-CM | POA: Diagnosis not present

## 2021-05-08 DIAGNOSIS — J302 Other seasonal allergic rhinitis: Secondary | ICD-10-CM | POA: Diagnosis not present

## 2021-05-08 DIAGNOSIS — I5022 Chronic systolic (congestive) heart failure: Secondary | ICD-10-CM | POA: Diagnosis not present

## 2021-05-08 DIAGNOSIS — E669 Obesity, unspecified: Secondary | ICD-10-CM | POA: Diagnosis not present

## 2021-05-08 DIAGNOSIS — T80212D Local infection due to central venous catheter, subsequent encounter: Secondary | ICD-10-CM | POA: Diagnosis not present

## 2021-05-08 DIAGNOSIS — L304 Erythema intertrigo: Secondary | ICD-10-CM | POA: Diagnosis not present

## 2021-05-08 DIAGNOSIS — I25119 Atherosclerotic heart disease of native coronary artery with unspecified angina pectoris: Secondary | ICD-10-CM | POA: Diagnosis not present

## 2021-05-08 DIAGNOSIS — F32A Depression, unspecified: Secondary | ICD-10-CM | POA: Diagnosis not present

## 2021-05-08 NOTE — Telephone Encounter (Signed)
Recalled pt reference PREP class starting on 05/11/21 ?Has not worked out transportation with [employer] or [employer] ?Asked that he contact me when he has and will find a class that works best for him  ?

## 2021-05-09 DIAGNOSIS — M545 Low back pain, unspecified: Secondary | ICD-10-CM | POA: Diagnosis not present

## 2021-05-09 DIAGNOSIS — E1142 Type 2 diabetes mellitus with diabetic polyneuropathy: Secondary | ICD-10-CM | POA: Diagnosis not present

## 2021-05-09 DIAGNOSIS — J45909 Unspecified asthma, uncomplicated: Secondary | ICD-10-CM | POA: Diagnosis not present

## 2021-05-09 DIAGNOSIS — Z9981 Dependence on supplemental oxygen: Secondary | ICD-10-CM | POA: Diagnosis not present

## 2021-05-09 DIAGNOSIS — I25119 Atherosclerotic heart disease of native coronary artery with unspecified angina pectoris: Secondary | ICD-10-CM | POA: Diagnosis not present

## 2021-05-09 DIAGNOSIS — L304 Erythema intertrigo: Secondary | ICD-10-CM | POA: Diagnosis not present

## 2021-05-09 DIAGNOSIS — T80212D Local infection due to central venous catheter, subsequent encounter: Secondary | ICD-10-CM | POA: Diagnosis not present

## 2021-05-09 DIAGNOSIS — I5022 Chronic systolic (congestive) heart failure: Secondary | ICD-10-CM | POA: Diagnosis not present

## 2021-05-09 DIAGNOSIS — F419 Anxiety disorder, unspecified: Secondary | ICD-10-CM | POA: Diagnosis not present

## 2021-05-09 DIAGNOSIS — G4733 Obstructive sleep apnea (adult) (pediatric): Secondary | ICD-10-CM | POA: Diagnosis not present

## 2021-05-09 DIAGNOSIS — I130 Hypertensive heart and chronic kidney disease with heart failure and stage 1 through stage 4 chronic kidney disease, or unspecified chronic kidney disease: Secondary | ICD-10-CM | POA: Diagnosis not present

## 2021-05-09 DIAGNOSIS — J9691 Respiratory failure, unspecified with hypoxia: Secondary | ICD-10-CM | POA: Diagnosis not present

## 2021-05-09 DIAGNOSIS — I4892 Unspecified atrial flutter: Secondary | ICD-10-CM | POA: Diagnosis not present

## 2021-05-09 DIAGNOSIS — M14672 Charcot's joint, left ankle and foot: Secondary | ICD-10-CM | POA: Diagnosis not present

## 2021-05-09 DIAGNOSIS — J302 Other seasonal allergic rhinitis: Secondary | ICD-10-CM | POA: Diagnosis not present

## 2021-05-09 DIAGNOSIS — Z794 Long term (current) use of insulin: Secondary | ICD-10-CM | POA: Diagnosis not present

## 2021-05-09 DIAGNOSIS — E669 Obesity, unspecified: Secondary | ICD-10-CM | POA: Diagnosis not present

## 2021-05-09 DIAGNOSIS — F32A Depression, unspecified: Secondary | ICD-10-CM | POA: Diagnosis not present

## 2021-05-10 DIAGNOSIS — L304 Erythema intertrigo: Secondary | ICD-10-CM | POA: Diagnosis not present

## 2021-05-10 DIAGNOSIS — T80212D Local infection due to central venous catheter, subsequent encounter: Secondary | ICD-10-CM | POA: Diagnosis not present

## 2021-05-10 DIAGNOSIS — I130 Hypertensive heart and chronic kidney disease with heart failure and stage 1 through stage 4 chronic kidney disease, or unspecified chronic kidney disease: Secondary | ICD-10-CM | POA: Diagnosis not present

## 2021-05-10 DIAGNOSIS — E1142 Type 2 diabetes mellitus with diabetic polyneuropathy: Secondary | ICD-10-CM | POA: Diagnosis not present

## 2021-05-10 DIAGNOSIS — F32A Depression, unspecified: Secondary | ICD-10-CM | POA: Diagnosis not present

## 2021-05-10 DIAGNOSIS — J302 Other seasonal allergic rhinitis: Secondary | ICD-10-CM | POA: Diagnosis not present

## 2021-05-10 DIAGNOSIS — M14672 Charcot's joint, left ankle and foot: Secondary | ICD-10-CM | POA: Diagnosis not present

## 2021-05-10 DIAGNOSIS — Z9981 Dependence on supplemental oxygen: Secondary | ICD-10-CM | POA: Diagnosis not present

## 2021-05-10 DIAGNOSIS — I5022 Chronic systolic (congestive) heart failure: Secondary | ICD-10-CM | POA: Diagnosis not present

## 2021-05-10 DIAGNOSIS — F419 Anxiety disorder, unspecified: Secondary | ICD-10-CM | POA: Diagnosis not present

## 2021-05-10 DIAGNOSIS — Z794 Long term (current) use of insulin: Secondary | ICD-10-CM | POA: Diagnosis not present

## 2021-05-10 DIAGNOSIS — I25119 Atherosclerotic heart disease of native coronary artery with unspecified angina pectoris: Secondary | ICD-10-CM | POA: Diagnosis not present

## 2021-05-10 DIAGNOSIS — J9691 Respiratory failure, unspecified with hypoxia: Secondary | ICD-10-CM | POA: Diagnosis not present

## 2021-05-10 DIAGNOSIS — I4892 Unspecified atrial flutter: Secondary | ICD-10-CM | POA: Diagnosis not present

## 2021-05-10 DIAGNOSIS — J45909 Unspecified asthma, uncomplicated: Secondary | ICD-10-CM | POA: Diagnosis not present

## 2021-05-10 DIAGNOSIS — M545 Low back pain, unspecified: Secondary | ICD-10-CM | POA: Diagnosis not present

## 2021-05-10 DIAGNOSIS — E669 Obesity, unspecified: Secondary | ICD-10-CM | POA: Diagnosis not present

## 2021-05-10 DIAGNOSIS — G4733 Obstructive sleep apnea (adult) (pediatric): Secondary | ICD-10-CM | POA: Diagnosis not present

## 2021-05-11 DIAGNOSIS — Z9981 Dependence on supplemental oxygen: Secondary | ICD-10-CM | POA: Diagnosis not present

## 2021-05-11 DIAGNOSIS — E669 Obesity, unspecified: Secondary | ICD-10-CM | POA: Diagnosis not present

## 2021-05-11 DIAGNOSIS — J9691 Respiratory failure, unspecified with hypoxia: Secondary | ICD-10-CM | POA: Diagnosis not present

## 2021-05-11 DIAGNOSIS — Z794 Long term (current) use of insulin: Secondary | ICD-10-CM | POA: Diagnosis not present

## 2021-05-11 DIAGNOSIS — J45909 Unspecified asthma, uncomplicated: Secondary | ICD-10-CM | POA: Diagnosis not present

## 2021-05-11 DIAGNOSIS — I25119 Atherosclerotic heart disease of native coronary artery with unspecified angina pectoris: Secondary | ICD-10-CM | POA: Diagnosis not present

## 2021-05-11 DIAGNOSIS — T80212D Local infection due to central venous catheter, subsequent encounter: Secondary | ICD-10-CM | POA: Diagnosis not present

## 2021-05-11 DIAGNOSIS — G4733 Obstructive sleep apnea (adult) (pediatric): Secondary | ICD-10-CM | POA: Diagnosis not present

## 2021-05-11 DIAGNOSIS — I4892 Unspecified atrial flutter: Secondary | ICD-10-CM | POA: Diagnosis not present

## 2021-05-11 DIAGNOSIS — E1169 Type 2 diabetes mellitus with other specified complication: Secondary | ICD-10-CM | POA: Diagnosis not present

## 2021-05-11 DIAGNOSIS — J302 Other seasonal allergic rhinitis: Secondary | ICD-10-CM | POA: Diagnosis not present

## 2021-05-11 DIAGNOSIS — F419 Anxiety disorder, unspecified: Secondary | ICD-10-CM | POA: Diagnosis not present

## 2021-05-11 DIAGNOSIS — I130 Hypertensive heart and chronic kidney disease with heart failure and stage 1 through stage 4 chronic kidney disease, or unspecified chronic kidney disease: Secondary | ICD-10-CM | POA: Diagnosis not present

## 2021-05-11 DIAGNOSIS — E119 Type 2 diabetes mellitus without complications: Secondary | ICD-10-CM | POA: Diagnosis not present

## 2021-05-11 DIAGNOSIS — M545 Low back pain, unspecified: Secondary | ICD-10-CM | POA: Diagnosis not present

## 2021-05-11 DIAGNOSIS — M14672 Charcot's joint, left ankle and foot: Secondary | ICD-10-CM | POA: Diagnosis not present

## 2021-05-11 DIAGNOSIS — E1142 Type 2 diabetes mellitus with diabetic polyneuropathy: Secondary | ICD-10-CM | POA: Diagnosis not present

## 2021-05-11 DIAGNOSIS — L304 Erythema intertrigo: Secondary | ICD-10-CM | POA: Diagnosis not present

## 2021-05-11 DIAGNOSIS — I5022 Chronic systolic (congestive) heart failure: Secondary | ICD-10-CM | POA: Diagnosis not present

## 2021-05-11 DIAGNOSIS — F32A Depression, unspecified: Secondary | ICD-10-CM | POA: Diagnosis not present

## 2021-05-11 NOTE — Progress Notes (Signed)
? ?ADVANCED HF CLINIC NOTE ? ?PCP: [name], MD ?HF Cardiologist: Dr. Haroldine Laws  ? ?HPI: ?[name] is a 36 y.o.with a history of chronic HFrEF, NICM, HTN, asthma, OSA, morbid obesity, and uncontrolled DM. He is on home milrinone 0.375 mcg.  ?  ?Diagnosed with systolic HF in [place] [address]. EF 15-20% ? ?Repeat Echo 02/2017. EF had normalized to 55% but went back down when he was off HF meds. EF in 2021 back down to 30-35%. He has refused ICD.  ?  ?He has been followed in the HF clinic and was last seen 05/2020.  He cancelled follow up appointments 08/2020 and 09/2020.  ? ?Admitted 10/28/20 with A/C HFrEF, A fib RVR , and marked volume overload. Hospital course complicated by low output so milrinone started. Diuresed with lasix drip + metolazone+ diamox.   Failed milrinone wean and was discharged on milrinone. He was not a candidate for advanced therapies due to noncompliance and uncontrolled diabetes. HGB A1C >14, Palliative consulted for goals of care. He elected DNR/DNI. Referred to [facility] for Hospice services.  ? ?Seen in ED 11/25/20 with home milrinone pump issues.  ? ?Admitted 04/05/21 with sepsis and A/C HFrEF.  Had been on home milrinone through PICC. Blood CX Enterococcus faecalis and Staph epidermidis bacteremia. Placed on ceftriaxone + ampicllin. PICC removed. Diuresed with IV lasix.HF meds adjusted. PICC line replaced once cultures clear. Discharged on 2 weeks of linezolid. Discharged on milrinone 0.375 mcg. Plan to consider transplant if he can lose weight. Discharge weight 337 pounds. ? ?He was seen in the HF clinic 04/23/21 and was given 80 mg IV lasix. Volume overloaded in the setting of high sodium diet.  Returned the next week, volume mildly up and metolazone 2.5 + extra 40 KCL added weekly. Dr. Haroldine Laws discussed pathway to transplant. ? ?Today he returns for HF follow up. Overall feeling fine. Mild SOB walking further distances and with inclines. Leg swelling is better. Wants to work out more,  motivated to lose weight. Milrinone pump beeping, has called company. Denies abnormal bleeding, palpitations, CP, dizziness,  or PND/Orthopnea. Appetite ok. No fever or chills. Weight at home 342 pounds. Taking all medications. Followed by [facility]. ? ?Cardiac Studies ?- 02/2021 EF < 20% RV severely reduced.    ?- Echo 9/22: EF<20%, severely decreased LV, grade II DD.  ?- Echo 10/21: EF 30-35% with moderate RV dysfunction in setting of recurrent RBBB with significant dyssynchrony.  ?- Echo 04/2019: EF 30-35%  ?- Echo 2019: EF 55%   ?- Echo EF 15-20% in [place] in 2017. Cath around that time without  ? ?ROS: All systems negative except as listed in HPI, PMH and Problem List. ? ?SH:  ?Social History  ? ?Socioeconomic History  ? Marital status: Married  ?  Spouse name: Not on file  ? Number of children: Not on file  ? Years of education: Not on file  ? Highest education level: Not on file  ?Occupational History  ? Not on file  ?Tobacco Use  ? Smoking status: Never  ? Smokeless tobacco: Never  ?Vaping Use  ? Vaping Use: Never used  ?Substance and Sexual Activity  ? Alcohol use: No  ? Drug use: No  ? Sexual activity: Never  ?Other Topics Concern  ? Not on file  ?Social History Narrative  ? Not on file  ? ?Social Determinants of Health  ? ?Financial Resource Strain: Low Risk   ? Difficulty of Paying Living Expenses: Not very hard  ?  Food Insecurity: No Food Insecurity  ? Worried About Charity fundraiser in the Last Year: Never true  ? Ran Out of Food in the Last Year: Never true  ?Transportation Needs: No Transportation Needs  ? Lack of Transportation (Medical): No  ? Lack of Transportation (Non-Medical): No  ?Physical Activity: Not on file  ?Stress: Not on file  ?Social Connections: Not on file  ?Intimate Partner Violence: Not on file  ? ?FH:  ?Family History  ?Problem Relation Age of Onset  ? Diabetes Mellitus II Mother   ? Heart failure Mother   ? Hypertension Mother   ? Heart disease Mother   ? Heart failure Father    ? Kidney disease Father   ? Heart disease Father   ? Congestive Heart Failure Neg Hx   ? ?Past Medical History:  ?Diagnosis Date  ? Acute exacerbation of CHF (congestive heart failure) ([place]) 04/05/2021  ? Acute on chronic heart failure ([place]) 10/28/2020  ? Acute pain of left foot 05/03/2017  ? Asthma   ? Asthma, chronic, mild persistent, uncomplicated 97/94/8016  ? Charcot ankle, left 05/31/2017  ? CHF (congestive heart failure) ([place])   ? Chronic systolic (congestive) heart failure (HCC)   ? Diabetes mellitus ([place])   ? Diabetes mellitus type 2 in obese ([place]) 04/21/2014  ? Diabetic polyneuropathy associated with type 2 diabetes mellitus ([place]) 03/14/2017  ? Essential hypertension 10/26/2016  ? Hypertension   ? Obesity   ? Obesity, Class III, BMI 40-49.9 (morbid obesity) ([place]) 10/26/2016  ? OSA (obstructive sleep apnea) 12/02/2016  ? Type 2 diabetes mellitus with diabetic neuropathy (HCC)   ? ?Current Outpatient Medications  ?Medication Sig Dispense Refill  ? Accu-Chek Softclix Lancets lancets Check blood sugar 3x a day 100 each 11  ? albuterol (VENTOLIN HFA) 108 (90 Base) MCG/ACT inhaler Inhale 2 puffs into the lungs every 4 (four) hours as needed for wheezing or shortness of breath. 18 g 0  ? amiodarone (PACERONE) 200 MG tablet TAKE 1/2 TABLET BY MOUTH DAILY 7 tablet 6  ? apixaban (ELIQUIS) 5 MG TABS tablet Take 1 tablet (5 mg total) by mouth 2 (two) times daily. 60 tablet 2  ? azelastine (ASTELIN) 0.1 % nasal spray Place into both nostrils as needed for rhinitis. Use in each nostril as directed    ? BIDIL 20-37.5 MG tablet TAKE 1 TABLET BY MOUTH THREE TIMES A DAY 45 tablet 5  ? blood glucose meter kit and supplies KIT Dispense based on patient and insurance preference. Use up to four times daily as directed. (FOR ICD-9 250.00, 250.01). For QAC - HS accuchecks. May switch to any brand. 1 each 1  ? Continuous Blood Gluc Sensor (FREESTYLE LIBRE 2 SENSOR) MISC Use to check blood sugar at least 6 times a day 2 each 11  ?  CVS SENNA 8.6 MG tablet Take 1 tablet by mouth daily as needed for constipation.    ? cyclobenzaprine (FLEXERIL) 5 MG tablet Take 5 mg by mouth every 6 (six) hours as needed for muscle spasms.    ? digoxin (LANOXIN) 0.125 MG tablet TAKE 1 TABLET BY MOUTH EVERY DAY 90 tablet 3  ? Dulaglutide (TRULICITY) 1.5 PV/3.7SM SOPN Inject 1 mg into the skin once a week. Patient takes every Monday.    ? ENTRESTO 24-26 MG TAKE 1 TABLET BY MOUTH TWICE A DAY 60 tablet 3  ? gabapentin (NEURONTIN) 300 MG capsule Take 300 mg by mouth as needed (pain).    ? glucose  blood (ACCU-CHEK GUIDE) test strip Check blood sugar 3 times per day 100 each 11  ? hyoscyamine (LEVSIN SL) 0.125 MG SL tablet Place 0.125 mg under the tongue every 4 (four) hours as needed for cramping (secretions).    ? insulin aspart (NOVOLOG) 100 UNIT/ML injection Inject 20 Units into the skin 3 (three) times daily with meals. 20 mL 11  ? insulin glargine (LANTUS) 100 UNIT/ML injection Inject 0.3 mLs (30 Units total) into the skin 2 (two) times daily. 20 mL 11  ? Insulin Syringe-Needle U-100 30G X 1/2" 1 ML MISC Use to inject insulin 4 times a day. 100 each 2  ? loratadine (CLARITIN) 10 MG tablet Take 1 tablet (10 mg total) by mouth 2 (two) times daily as needed for allergies (Can use an extra dose during flare ups.). 60 tablet 5  ? metolazone (ZAROXOLYN) 2.5 MG tablet Take 1 tablet (2.5 mg total) by mouth once a week. On Thursdays 5 tablet 3  ? milrinone (PRIMACOR) 20 MG/100 ML SOLN infusion Inject 0.0596 mg/min into the vein continuous. 100 mL 0  ? nitroGLYCERIN (NITROSTAT) 0.3 MG SL tablet Place 0.3 mg under the tongue every 5 (five) minutes as needed for chest pain.    ? ondansetron (ZOFRAN) 4 MG tablet Take 4 mg by mouth daily as needed for nausea or vomiting.    ? potassium chloride SA (KLOR-CON M) 20 MEQ tablet Take 2 tablets (40 mEq total) by mouth 2 (two) times daily. Take extra 40 meq (2 tabs) every Thursday when you take Metolazone 130 tablet 3  ?  spironolactone (ALDACTONE) 25 MG tablet Take 1 tablet (25 mg total) by mouth daily. 30 tablet 0  ? torsemide (DEMADEX) 20 MG tablet Take 4 tablets (80 mg total) by mouth 2 (two) times daily. 240 tablet 0  ? traMADol (ULT

## 2021-05-12 DIAGNOSIS — Z9981 Dependence on supplemental oxygen: Secondary | ICD-10-CM | POA: Diagnosis not present

## 2021-05-12 DIAGNOSIS — I5022 Chronic systolic (congestive) heart failure: Secondary | ICD-10-CM | POA: Diagnosis not present

## 2021-05-12 DIAGNOSIS — T80212D Local infection due to central venous catheter, subsequent encounter: Secondary | ICD-10-CM | POA: Diagnosis not present

## 2021-05-12 DIAGNOSIS — J9691 Respiratory failure, unspecified with hypoxia: Secondary | ICD-10-CM | POA: Diagnosis not present

## 2021-05-12 DIAGNOSIS — I4892 Unspecified atrial flutter: Secondary | ICD-10-CM | POA: Diagnosis not present

## 2021-05-12 DIAGNOSIS — Z794 Long term (current) use of insulin: Secondary | ICD-10-CM | POA: Diagnosis not present

## 2021-05-12 DIAGNOSIS — E1142 Type 2 diabetes mellitus with diabetic polyneuropathy: Secondary | ICD-10-CM | POA: Diagnosis not present

## 2021-05-12 DIAGNOSIS — I25119 Atherosclerotic heart disease of native coronary artery with unspecified angina pectoris: Secondary | ICD-10-CM | POA: Diagnosis not present

## 2021-05-12 DIAGNOSIS — M14672 Charcot's joint, left ankle and foot: Secondary | ICD-10-CM | POA: Diagnosis not present

## 2021-05-12 DIAGNOSIS — E669 Obesity, unspecified: Secondary | ICD-10-CM | POA: Diagnosis not present

## 2021-05-12 DIAGNOSIS — M545 Low back pain, unspecified: Secondary | ICD-10-CM | POA: Diagnosis not present

## 2021-05-12 DIAGNOSIS — G4733 Obstructive sleep apnea (adult) (pediatric): Secondary | ICD-10-CM | POA: Diagnosis not present

## 2021-05-12 DIAGNOSIS — F419 Anxiety disorder, unspecified: Secondary | ICD-10-CM | POA: Diagnosis not present

## 2021-05-12 DIAGNOSIS — F32A Depression, unspecified: Secondary | ICD-10-CM | POA: Diagnosis not present

## 2021-05-12 DIAGNOSIS — I130 Hypertensive heart and chronic kidney disease with heart failure and stage 1 through stage 4 chronic kidney disease, or unspecified chronic kidney disease: Secondary | ICD-10-CM | POA: Diagnosis not present

## 2021-05-12 DIAGNOSIS — J302 Other seasonal allergic rhinitis: Secondary | ICD-10-CM | POA: Diagnosis not present

## 2021-05-12 DIAGNOSIS — J45909 Unspecified asthma, uncomplicated: Secondary | ICD-10-CM | POA: Diagnosis not present

## 2021-05-12 DIAGNOSIS — L304 Erythema intertrigo: Secondary | ICD-10-CM | POA: Diagnosis not present

## 2021-05-13 ENCOUNTER — Ambulatory Visit (HOSPITAL_COMMUNITY)
Admission: RE | Admit: 2021-05-13 | Discharge: 2021-05-13 | Disposition: A | Discharge status: Home / Self Care | Payer: Medicaid Other | Source: Ambulatory Visit | Attending: Family Medicine | Admitting: Family Medicine

## 2021-05-13 ENCOUNTER — Encounter (HOSPITAL_COMMUNITY): Payer: Self-pay

## 2021-05-13 ENCOUNTER — Other Ambulatory Visit: Payer: Self-pay

## 2021-05-13 VITALS — BP 108/72 | HR 111 | Wt 349.6 lb

## 2021-05-13 DIAGNOSIS — R7881 Bacteremia: Secondary | ICD-10-CM | POA: Insufficient documentation

## 2021-05-13 DIAGNOSIS — B957 Other staphylococcus as the cause of diseases classified elsewhere: Secondary | ICD-10-CM | POA: Insufficient documentation

## 2021-05-13 DIAGNOSIS — I482 Chronic atrial fibrillation, unspecified: Secondary | ICD-10-CM | POA: Insufficient documentation

## 2021-05-13 DIAGNOSIS — E669 Obesity, unspecified: Secondary | ICD-10-CM | POA: Diagnosis not present

## 2021-05-13 DIAGNOSIS — I5082 Biventricular heart failure: Secondary | ICD-10-CM | POA: Insufficient documentation

## 2021-05-13 DIAGNOSIS — Z9981 Dependence on supplemental oxygen: Secondary | ICD-10-CM | POA: Diagnosis not present

## 2021-05-13 DIAGNOSIS — Z7189 Other specified counseling: Secondary | ICD-10-CM

## 2021-05-13 DIAGNOSIS — Z792 Long term (current) use of antibiotics: Secondary | ICD-10-CM | POA: Diagnosis not present

## 2021-05-13 DIAGNOSIS — Z7901 Long term (current) use of anticoagulants: Secondary | ICD-10-CM | POA: Diagnosis not present

## 2021-05-13 DIAGNOSIS — E1165 Type 2 diabetes mellitus with hyperglycemia: Secondary | ICD-10-CM | POA: Insufficient documentation

## 2021-05-13 DIAGNOSIS — N183 Chronic kidney disease, stage 3 unspecified: Secondary | ICD-10-CM | POA: Diagnosis not present

## 2021-05-13 DIAGNOSIS — J45909 Unspecified asthma, uncomplicated: Secondary | ICD-10-CM | POA: Diagnosis not present

## 2021-05-13 DIAGNOSIS — I4892 Unspecified atrial flutter: Secondary | ICD-10-CM

## 2021-05-13 DIAGNOSIS — B379 Candidiasis, unspecified: Secondary | ICD-10-CM | POA: Insufficient documentation

## 2021-05-13 DIAGNOSIS — Z79899 Other long term (current) drug therapy: Secondary | ICD-10-CM | POA: Insufficient documentation

## 2021-05-13 DIAGNOSIS — B952 Enterococcus as the cause of diseases classified elsewhere: Secondary | ICD-10-CM | POA: Diagnosis not present

## 2021-05-13 DIAGNOSIS — Z833 Family history of diabetes mellitus: Secondary | ICD-10-CM | POA: Insufficient documentation

## 2021-05-13 DIAGNOSIS — Z66 Do not resuscitate: Secondary | ICD-10-CM | POA: Diagnosis not present

## 2021-05-13 DIAGNOSIS — M7989 Other specified soft tissue disorders: Secondary | ICD-10-CM | POA: Insufficient documentation

## 2021-05-13 DIAGNOSIS — I5022 Chronic systolic (congestive) heart failure: Secondary | ICD-10-CM

## 2021-05-13 DIAGNOSIS — Z6841 Body Mass Index (BMI) 40.0 and over, adult: Secondary | ICD-10-CM | POA: Insufficient documentation

## 2021-05-13 DIAGNOSIS — J9691 Respiratory failure, unspecified with hypoxia: Secondary | ICD-10-CM | POA: Diagnosis not present

## 2021-05-13 DIAGNOSIS — I4510 Unspecified right bundle-branch block: Secondary | ICD-10-CM | POA: Insufficient documentation

## 2021-05-13 DIAGNOSIS — E1122 Type 2 diabetes mellitus with diabetic chronic kidney disease: Secondary | ICD-10-CM | POA: Diagnosis not present

## 2021-05-13 DIAGNOSIS — M14672 Charcot's joint, left ankle and foot: Secondary | ICD-10-CM | POA: Diagnosis not present

## 2021-05-13 DIAGNOSIS — G4733 Obstructive sleep apnea (adult) (pediatric): Secondary | ICD-10-CM | POA: Diagnosis not present

## 2021-05-13 DIAGNOSIS — Z91199 Patient's noncompliance with other medical treatment and regimen due to unspecified reason: Secondary | ICD-10-CM | POA: Insufficient documentation

## 2021-05-13 DIAGNOSIS — F419 Anxiety disorder, unspecified: Secondary | ICD-10-CM | POA: Diagnosis not present

## 2021-05-13 DIAGNOSIS — T80212D Local infection due to central venous catheter, subsequent encounter: Secondary | ICD-10-CM | POA: Diagnosis not present

## 2021-05-13 DIAGNOSIS — I130 Hypertensive heart and chronic kidney disease with heart failure and stage 1 through stage 4 chronic kidney disease, or unspecified chronic kidney disease: Secondary | ICD-10-CM | POA: Diagnosis not present

## 2021-05-13 DIAGNOSIS — I428 Other cardiomyopathies: Secondary | ICD-10-CM | POA: Insufficient documentation

## 2021-05-13 DIAGNOSIS — I25119 Atherosclerotic heart disease of native coronary artery with unspecified angina pectoris: Secondary | ICD-10-CM | POA: Diagnosis not present

## 2021-05-13 DIAGNOSIS — J302 Other seasonal allergic rhinitis: Secondary | ICD-10-CM | POA: Diagnosis not present

## 2021-05-13 DIAGNOSIS — E1142 Type 2 diabetes mellitus with diabetic polyneuropathy: Secondary | ICD-10-CM | POA: Diagnosis not present

## 2021-05-13 DIAGNOSIS — M545 Low back pain, unspecified: Secondary | ICD-10-CM | POA: Diagnosis not present

## 2021-05-13 DIAGNOSIS — F32A Depression, unspecified: Secondary | ICD-10-CM | POA: Diagnosis not present

## 2021-05-13 DIAGNOSIS — N1832 Chronic kidney disease, stage 3b: Secondary | ICD-10-CM | POA: Diagnosis not present

## 2021-05-13 DIAGNOSIS — Z794 Long term (current) use of insulin: Secondary | ICD-10-CM | POA: Diagnosis not present

## 2021-05-13 DIAGNOSIS — L304 Erythema intertrigo: Secondary | ICD-10-CM | POA: Diagnosis not present

## 2021-05-13 DIAGNOSIS — Z8249 Family history of ischemic heart disease and other diseases of the circulatory system: Secondary | ICD-10-CM | POA: Insufficient documentation

## 2021-05-13 NOTE — Patient Instructions (Signed)
Thank you for coming in today ? ?Your physician recommends that you schedule a follow-up appointment in:  ?2-3 weeks in clinic  ?Next available with Dr. Haroldine Laws ? ?You have been referred to [facility] their office will contact you ? ?I also contacted [place] about your pump beeping they will have nurse out to your house the afternoon and they will call you before they come. ? ?At the [facility], you and your health needs are our priority. As part of our continuing mission to provide you with exceptional heart care, we have created designated Provider Care Teams. These Care Teams include your primary Cardiologist (physician) and Advanced Practice Providers (APPs- Physician Assistants and Nurse Practitioners) who all work together to provide you with the care you need, when you need it.  ? ?You may see any of the following providers on your designated Care Team at your next follow up: ?Dr Glori Bickers ?Dr Loralie Champagne ?Darrick Grinder, NP ?Lyda Jester, PA ?Jessica Milford,NP ?Marlyce Huge, PA ?Audry Riles, PharmD ? ? ?Please be sure to bring in all your medications bottles to every appointment.  ?If you have any questions or concerns before your next appointment please send [place] a message through [place] or call our office at [phone].   ? ?TO LEAVE A MESSAGE FOR THE NURSE SELECT OPTION 2, PLEASE LEAVE A MESSAGE INCLUDING: ?YOUR NAME ?DATE OF BIRTH ?CALL BACK NUMBER ?REASON FOR CALL**this is important as we prioritize the call backs ? ?YOU WILL RECEIVE A CALL BACK THE SAME DAY AS LONG AS YOU CALL BEFORE 4:00 PM ? ?

## 2021-05-13 NOTE — Progress Notes (Signed)
[place] for pt has milrinone pump beeping, called he [facility] office and spoke with Sam he stated that he would have a nurse come out to patient house this afternoon and the nurse would call before they arrive. ?

## 2021-05-14 DIAGNOSIS — I4892 Unspecified atrial flutter: Secondary | ICD-10-CM | POA: Diagnosis not present

## 2021-05-14 DIAGNOSIS — Z794 Long term (current) use of insulin: Secondary | ICD-10-CM | POA: Diagnosis not present

## 2021-05-14 DIAGNOSIS — G4733 Obstructive sleep apnea (adult) (pediatric): Secondary | ICD-10-CM | POA: Diagnosis not present

## 2021-05-14 DIAGNOSIS — J9691 Respiratory failure, unspecified with hypoxia: Secondary | ICD-10-CM | POA: Diagnosis not present

## 2021-05-14 DIAGNOSIS — I130 Hypertensive heart and chronic kidney disease with heart failure and stage 1 through stage 4 chronic kidney disease, or unspecified chronic kidney disease: Secondary | ICD-10-CM | POA: Diagnosis not present

## 2021-05-14 DIAGNOSIS — M545 Low back pain, unspecified: Secondary | ICD-10-CM | POA: Diagnosis not present

## 2021-05-14 DIAGNOSIS — E669 Obesity, unspecified: Secondary | ICD-10-CM | POA: Diagnosis not present

## 2021-05-14 DIAGNOSIS — F419 Anxiety disorder, unspecified: Secondary | ICD-10-CM | POA: Diagnosis not present

## 2021-05-14 DIAGNOSIS — Z9981 Dependence on supplemental oxygen: Secondary | ICD-10-CM | POA: Diagnosis not present

## 2021-05-14 DIAGNOSIS — F32A Depression, unspecified: Secondary | ICD-10-CM | POA: Diagnosis not present

## 2021-05-14 DIAGNOSIS — I25119 Atherosclerotic heart disease of native coronary artery with unspecified angina pectoris: Secondary | ICD-10-CM | POA: Diagnosis not present

## 2021-05-14 DIAGNOSIS — J45909 Unspecified asthma, uncomplicated: Secondary | ICD-10-CM | POA: Diagnosis not present

## 2021-05-14 DIAGNOSIS — J302 Other seasonal allergic rhinitis: Secondary | ICD-10-CM | POA: Diagnosis not present

## 2021-05-14 DIAGNOSIS — M14672 Charcot's joint, left ankle and foot: Secondary | ICD-10-CM | POA: Diagnosis not present

## 2021-05-14 DIAGNOSIS — I5022 Chronic systolic (congestive) heart failure: Secondary | ICD-10-CM | POA: Diagnosis not present

## 2021-05-14 DIAGNOSIS — T80212D Local infection due to central venous catheter, subsequent encounter: Secondary | ICD-10-CM | POA: Diagnosis not present

## 2021-05-14 DIAGNOSIS — L304 Erythema intertrigo: Secondary | ICD-10-CM | POA: Diagnosis not present

## 2021-05-14 DIAGNOSIS — E1142 Type 2 diabetes mellitus with diabetic polyneuropathy: Secondary | ICD-10-CM | POA: Diagnosis not present

## 2021-05-15 DIAGNOSIS — T80212D Local infection due to central venous catheter, subsequent encounter: Secondary | ICD-10-CM | POA: Diagnosis not present

## 2021-05-15 DIAGNOSIS — M14672 Charcot's joint, left ankle and foot: Secondary | ICD-10-CM | POA: Diagnosis not present

## 2021-05-15 DIAGNOSIS — F419 Anxiety disorder, unspecified: Secondary | ICD-10-CM | POA: Diagnosis not present

## 2021-05-15 DIAGNOSIS — L304 Erythema intertrigo: Secondary | ICD-10-CM | POA: Diagnosis not present

## 2021-05-15 DIAGNOSIS — G4733 Obstructive sleep apnea (adult) (pediatric): Secondary | ICD-10-CM | POA: Diagnosis not present

## 2021-05-15 DIAGNOSIS — E1142 Type 2 diabetes mellitus with diabetic polyneuropathy: Secondary | ICD-10-CM | POA: Diagnosis not present

## 2021-05-15 DIAGNOSIS — J9691 Respiratory failure, unspecified with hypoxia: Secondary | ICD-10-CM | POA: Diagnosis not present

## 2021-05-15 DIAGNOSIS — I4892 Unspecified atrial flutter: Secondary | ICD-10-CM | POA: Diagnosis not present

## 2021-05-15 DIAGNOSIS — J302 Other seasonal allergic rhinitis: Secondary | ICD-10-CM | POA: Diagnosis not present

## 2021-05-15 DIAGNOSIS — Z9981 Dependence on supplemental oxygen: Secondary | ICD-10-CM | POA: Diagnosis not present

## 2021-05-15 DIAGNOSIS — M545 Low back pain, unspecified: Secondary | ICD-10-CM | POA: Diagnosis not present

## 2021-05-15 DIAGNOSIS — E669 Obesity, unspecified: Secondary | ICD-10-CM | POA: Diagnosis not present

## 2021-05-15 DIAGNOSIS — I5022 Chronic systolic (congestive) heart failure: Secondary | ICD-10-CM | POA: Diagnosis not present

## 2021-05-15 DIAGNOSIS — Z794 Long term (current) use of insulin: Secondary | ICD-10-CM | POA: Diagnosis not present

## 2021-05-15 DIAGNOSIS — I25119 Atherosclerotic heart disease of native coronary artery with unspecified angina pectoris: Secondary | ICD-10-CM | POA: Diagnosis not present

## 2021-05-15 DIAGNOSIS — I130 Hypertensive heart and chronic kidney disease with heart failure and stage 1 through stage 4 chronic kidney disease, or unspecified chronic kidney disease: Secondary | ICD-10-CM | POA: Diagnosis not present

## 2021-05-15 DIAGNOSIS — J45909 Unspecified asthma, uncomplicated: Secondary | ICD-10-CM | POA: Diagnosis not present

## 2021-05-15 DIAGNOSIS — F32A Depression, unspecified: Secondary | ICD-10-CM | POA: Diagnosis not present

## 2021-05-16 DIAGNOSIS — E1142 Type 2 diabetes mellitus with diabetic polyneuropathy: Secondary | ICD-10-CM | POA: Diagnosis not present

## 2021-05-16 DIAGNOSIS — I5022 Chronic systolic (congestive) heart failure: Secondary | ICD-10-CM | POA: Diagnosis not present

## 2021-05-16 DIAGNOSIS — F32A Depression, unspecified: Secondary | ICD-10-CM | POA: Diagnosis not present

## 2021-05-16 DIAGNOSIS — I4892 Unspecified atrial flutter: Secondary | ICD-10-CM | POA: Diagnosis not present

## 2021-05-16 DIAGNOSIS — I130 Hypertensive heart and chronic kidney disease with heart failure and stage 1 through stage 4 chronic kidney disease, or unspecified chronic kidney disease: Secondary | ICD-10-CM | POA: Diagnosis not present

## 2021-05-16 DIAGNOSIS — T80212D Local infection due to central venous catheter, subsequent encounter: Secondary | ICD-10-CM | POA: Diagnosis not present

## 2021-05-16 DIAGNOSIS — J302 Other seasonal allergic rhinitis: Secondary | ICD-10-CM | POA: Diagnosis not present

## 2021-05-16 DIAGNOSIS — Z9981 Dependence on supplemental oxygen: Secondary | ICD-10-CM | POA: Diagnosis not present

## 2021-05-16 DIAGNOSIS — G4733 Obstructive sleep apnea (adult) (pediatric): Secondary | ICD-10-CM | POA: Diagnosis not present

## 2021-05-16 DIAGNOSIS — M14672 Charcot's joint, left ankle and foot: Secondary | ICD-10-CM | POA: Diagnosis not present

## 2021-05-16 DIAGNOSIS — F419 Anxiety disorder, unspecified: Secondary | ICD-10-CM | POA: Diagnosis not present

## 2021-05-16 DIAGNOSIS — I25119 Atherosclerotic heart disease of native coronary artery with unspecified angina pectoris: Secondary | ICD-10-CM | POA: Diagnosis not present

## 2021-05-16 DIAGNOSIS — L304 Erythema intertrigo: Secondary | ICD-10-CM | POA: Diagnosis not present

## 2021-05-16 DIAGNOSIS — M545 Low back pain, unspecified: Secondary | ICD-10-CM | POA: Diagnosis not present

## 2021-05-16 DIAGNOSIS — Z794 Long term (current) use of insulin: Secondary | ICD-10-CM | POA: Diagnosis not present

## 2021-05-16 DIAGNOSIS — J45909 Unspecified asthma, uncomplicated: Secondary | ICD-10-CM | POA: Diagnosis not present

## 2021-05-16 DIAGNOSIS — E669 Obesity, unspecified: Secondary | ICD-10-CM | POA: Diagnosis not present

## 2021-05-16 DIAGNOSIS — J9691 Respiratory failure, unspecified with hypoxia: Secondary | ICD-10-CM | POA: Diagnosis not present

## 2021-05-17 DIAGNOSIS — I130 Hypertensive heart and chronic kidney disease with heart failure and stage 1 through stage 4 chronic kidney disease, or unspecified chronic kidney disease: Secondary | ICD-10-CM | POA: Diagnosis not present

## 2021-05-17 DIAGNOSIS — I5022 Chronic systolic (congestive) heart failure: Secondary | ICD-10-CM | POA: Diagnosis not present

## 2021-05-17 DIAGNOSIS — J45909 Unspecified asthma, uncomplicated: Secondary | ICD-10-CM | POA: Diagnosis not present

## 2021-05-17 DIAGNOSIS — E1142 Type 2 diabetes mellitus with diabetic polyneuropathy: Secondary | ICD-10-CM | POA: Diagnosis not present

## 2021-05-17 DIAGNOSIS — I25119 Atherosclerotic heart disease of native coronary artery with unspecified angina pectoris: Secondary | ICD-10-CM | POA: Diagnosis not present

## 2021-05-17 DIAGNOSIS — T80212D Local infection due to central venous catheter, subsequent encounter: Secondary | ICD-10-CM | POA: Diagnosis not present

## 2021-05-17 DIAGNOSIS — Z794 Long term (current) use of insulin: Secondary | ICD-10-CM | POA: Diagnosis not present

## 2021-05-17 DIAGNOSIS — I4892 Unspecified atrial flutter: Secondary | ICD-10-CM | POA: Diagnosis not present

## 2021-05-17 DIAGNOSIS — M14672 Charcot's joint, left ankle and foot: Secondary | ICD-10-CM | POA: Diagnosis not present

## 2021-05-17 DIAGNOSIS — M545 Low back pain, unspecified: Secondary | ICD-10-CM | POA: Diagnosis not present

## 2021-05-17 DIAGNOSIS — Z9981 Dependence on supplemental oxygen: Secondary | ICD-10-CM | POA: Diagnosis not present

## 2021-05-17 DIAGNOSIS — E669 Obesity, unspecified: Secondary | ICD-10-CM | POA: Diagnosis not present

## 2021-05-17 DIAGNOSIS — L304 Erythema intertrigo: Secondary | ICD-10-CM | POA: Diagnosis not present

## 2021-05-17 DIAGNOSIS — F419 Anxiety disorder, unspecified: Secondary | ICD-10-CM | POA: Diagnosis not present

## 2021-05-17 DIAGNOSIS — J9691 Respiratory failure, unspecified with hypoxia: Secondary | ICD-10-CM | POA: Diagnosis not present

## 2021-05-17 DIAGNOSIS — J302 Other seasonal allergic rhinitis: Secondary | ICD-10-CM | POA: Diagnosis not present

## 2021-05-17 DIAGNOSIS — G4733 Obstructive sleep apnea (adult) (pediatric): Secondary | ICD-10-CM | POA: Diagnosis not present

## 2021-05-17 DIAGNOSIS — F32A Depression, unspecified: Secondary | ICD-10-CM | POA: Diagnosis not present

## 2021-05-18 DIAGNOSIS — E1142 Type 2 diabetes mellitus with diabetic polyneuropathy: Secondary | ICD-10-CM | POA: Diagnosis not present

## 2021-05-18 DIAGNOSIS — Z9981 Dependence on supplemental oxygen: Secondary | ICD-10-CM | POA: Diagnosis not present

## 2021-05-18 DIAGNOSIS — T80212D Local infection due to central venous catheter, subsequent encounter: Secondary | ICD-10-CM | POA: Diagnosis not present

## 2021-05-18 DIAGNOSIS — I130 Hypertensive heart and chronic kidney disease with heart failure and stage 1 through stage 4 chronic kidney disease, or unspecified chronic kidney disease: Secondary | ICD-10-CM | POA: Diagnosis not present

## 2021-05-18 DIAGNOSIS — I5022 Chronic systolic (congestive) heart failure: Secondary | ICD-10-CM | POA: Diagnosis not present

## 2021-05-18 DIAGNOSIS — J9691 Respiratory failure, unspecified with hypoxia: Secondary | ICD-10-CM | POA: Diagnosis not present

## 2021-05-18 DIAGNOSIS — J45909 Unspecified asthma, uncomplicated: Secondary | ICD-10-CM | POA: Diagnosis not present

## 2021-05-18 DIAGNOSIS — I4892 Unspecified atrial flutter: Secondary | ICD-10-CM | POA: Diagnosis not present

## 2021-05-18 DIAGNOSIS — G4733 Obstructive sleep apnea (adult) (pediatric): Secondary | ICD-10-CM | POA: Diagnosis not present

## 2021-05-18 DIAGNOSIS — Z794 Long term (current) use of insulin: Secondary | ICD-10-CM | POA: Diagnosis not present

## 2021-05-18 DIAGNOSIS — F32A Depression, unspecified: Secondary | ICD-10-CM | POA: Diagnosis not present

## 2021-05-18 DIAGNOSIS — M14672 Charcot's joint, left ankle and foot: Secondary | ICD-10-CM | POA: Diagnosis not present

## 2021-05-18 DIAGNOSIS — J302 Other seasonal allergic rhinitis: Secondary | ICD-10-CM | POA: Diagnosis not present

## 2021-05-18 DIAGNOSIS — I25119 Atherosclerotic heart disease of native coronary artery with unspecified angina pectoris: Secondary | ICD-10-CM | POA: Diagnosis not present

## 2021-05-18 DIAGNOSIS — E669 Obesity, unspecified: Secondary | ICD-10-CM | POA: Diagnosis not present

## 2021-05-18 DIAGNOSIS — M545 Low back pain, unspecified: Secondary | ICD-10-CM | POA: Diagnosis not present

## 2021-05-18 DIAGNOSIS — L304 Erythema intertrigo: Secondary | ICD-10-CM | POA: Diagnosis not present

## 2021-05-18 DIAGNOSIS — F419 Anxiety disorder, unspecified: Secondary | ICD-10-CM | POA: Diagnosis not present

## 2021-05-19 DIAGNOSIS — E669 Obesity, unspecified: Secondary | ICD-10-CM | POA: Diagnosis not present

## 2021-05-19 DIAGNOSIS — J9691 Respiratory failure, unspecified with hypoxia: Secondary | ICD-10-CM | POA: Diagnosis not present

## 2021-05-19 DIAGNOSIS — M545 Low back pain, unspecified: Secondary | ICD-10-CM | POA: Diagnosis not present

## 2021-05-19 DIAGNOSIS — I4892 Unspecified atrial flutter: Secondary | ICD-10-CM | POA: Diagnosis not present

## 2021-05-19 DIAGNOSIS — G4733 Obstructive sleep apnea (adult) (pediatric): Secondary | ICD-10-CM | POA: Diagnosis not present

## 2021-05-19 DIAGNOSIS — W19XXXA Unspecified fall, initial encounter: Secondary | ICD-10-CM | POA: Diagnosis not present

## 2021-05-19 DIAGNOSIS — I5022 Chronic systolic (congestive) heart failure: Secondary | ICD-10-CM | POA: Diagnosis not present

## 2021-05-19 DIAGNOSIS — J302 Other seasonal allergic rhinitis: Secondary | ICD-10-CM | POA: Diagnosis not present

## 2021-05-19 DIAGNOSIS — M14672 Charcot's joint, left ankle and foot: Secondary | ICD-10-CM | POA: Diagnosis not present

## 2021-05-19 DIAGNOSIS — F419 Anxiety disorder, unspecified: Secondary | ICD-10-CM | POA: Diagnosis not present

## 2021-05-19 DIAGNOSIS — R55 Syncope and collapse: Secondary | ICD-10-CM | POA: Diagnosis not present

## 2021-05-19 DIAGNOSIS — R110 Nausea: Secondary | ICD-10-CM | POA: Diagnosis not present

## 2021-05-19 DIAGNOSIS — T80212D Local infection due to central venous catheter, subsequent encounter: Secondary | ICD-10-CM | POA: Diagnosis not present

## 2021-05-19 DIAGNOSIS — I25119 Atherosclerotic heart disease of native coronary artery with unspecified angina pectoris: Secondary | ICD-10-CM | POA: Diagnosis not present

## 2021-05-19 DIAGNOSIS — Z9981 Dependence on supplemental oxygen: Secondary | ICD-10-CM | POA: Diagnosis not present

## 2021-05-19 DIAGNOSIS — R42 Dizziness and giddiness: Secondary | ICD-10-CM | POA: Diagnosis not present

## 2021-05-19 DIAGNOSIS — Z794 Long term (current) use of insulin: Secondary | ICD-10-CM | POA: Diagnosis not present

## 2021-05-19 DIAGNOSIS — J45909 Unspecified asthma, uncomplicated: Secondary | ICD-10-CM | POA: Diagnosis not present

## 2021-05-19 DIAGNOSIS — E1142 Type 2 diabetes mellitus with diabetic polyneuropathy: Secondary | ICD-10-CM | POA: Diagnosis not present

## 2021-05-19 DIAGNOSIS — I959 Hypotension, unspecified: Secondary | ICD-10-CM | POA: Diagnosis not present

## 2021-05-19 DIAGNOSIS — F32A Depression, unspecified: Secondary | ICD-10-CM | POA: Diagnosis not present

## 2021-05-19 DIAGNOSIS — I130 Hypertensive heart and chronic kidney disease with heart failure and stage 1 through stage 4 chronic kidney disease, or unspecified chronic kidney disease: Secondary | ICD-10-CM | POA: Diagnosis not present

## 2021-05-19 DIAGNOSIS — L304 Erythema intertrigo: Secondary | ICD-10-CM | POA: Diagnosis not present

## 2021-05-20 ENCOUNTER — Encounter: Payer: Self-pay | Admitting: Internal Medicine

## 2021-05-20 ENCOUNTER — Other Ambulatory Visit: Payer: Self-pay | Admitting: Internal Medicine

## 2021-05-20 ENCOUNTER — Ambulatory Visit: Payer: Medicaid Other | Admitting: Internal Medicine

## 2021-05-20 ENCOUNTER — Other Ambulatory Visit (HOSPITAL_COMMUNITY): Payer: Self-pay

## 2021-05-20 VITALS — BP 110/88 | HR 108 | Temp 98.2°F | Wt 362.8 lb

## 2021-05-20 DIAGNOSIS — J302 Other seasonal allergic rhinitis: Secondary | ICD-10-CM | POA: Diagnosis not present

## 2021-05-20 DIAGNOSIS — I5022 Chronic systolic (congestive) heart failure: Secondary | ICD-10-CM

## 2021-05-20 DIAGNOSIS — L304 Erythema intertrigo: Secondary | ICD-10-CM | POA: Diagnosis not present

## 2021-05-20 DIAGNOSIS — R197 Diarrhea, unspecified: Secondary | ICD-10-CM

## 2021-05-20 DIAGNOSIS — M545 Low back pain, unspecified: Secondary | ICD-10-CM | POA: Diagnosis not present

## 2021-05-20 DIAGNOSIS — I130 Hypertensive heart and chronic kidney disease with heart failure and stage 1 through stage 4 chronic kidney disease, or unspecified chronic kidney disease: Secondary | ICD-10-CM | POA: Diagnosis not present

## 2021-05-20 DIAGNOSIS — E1169 Type 2 diabetes mellitus with other specified complication: Secondary | ICD-10-CM

## 2021-05-20 DIAGNOSIS — F32A Depression, unspecified: Secondary | ICD-10-CM | POA: Diagnosis not present

## 2021-05-20 DIAGNOSIS — E669 Obesity, unspecified: Secondary | ICD-10-CM

## 2021-05-20 DIAGNOSIS — Z794 Long term (current) use of insulin: Secondary | ICD-10-CM | POA: Diagnosis not present

## 2021-05-20 DIAGNOSIS — M14672 Charcot's joint, left ankle and foot: Secondary | ICD-10-CM | POA: Diagnosis not present

## 2021-05-20 DIAGNOSIS — E1142 Type 2 diabetes mellitus with diabetic polyneuropathy: Secondary | ICD-10-CM | POA: Diagnosis not present

## 2021-05-20 DIAGNOSIS — R109 Unspecified abdominal pain: Secondary | ICD-10-CM | POA: Diagnosis not present

## 2021-05-20 DIAGNOSIS — F419 Anxiety disorder, unspecified: Secondary | ICD-10-CM | POA: Diagnosis not present

## 2021-05-20 DIAGNOSIS — I4892 Unspecified atrial flutter: Secondary | ICD-10-CM | POA: Diagnosis not present

## 2021-05-20 DIAGNOSIS — J45909 Unspecified asthma, uncomplicated: Secondary | ICD-10-CM | POA: Diagnosis not present

## 2021-05-20 DIAGNOSIS — I25119 Atherosclerotic heart disease of native coronary artery with unspecified angina pectoris: Secondary | ICD-10-CM | POA: Diagnosis not present

## 2021-05-20 DIAGNOSIS — G4733 Obstructive sleep apnea (adult) (pediatric): Secondary | ICD-10-CM | POA: Diagnosis not present

## 2021-05-20 DIAGNOSIS — Z6841 Body Mass Index (BMI) 40.0 and over, adult: Secondary | ICD-10-CM | POA: Diagnosis not present

## 2021-05-20 DIAGNOSIS — J9691 Respiratory failure, unspecified with hypoxia: Secondary | ICD-10-CM | POA: Diagnosis not present

## 2021-05-20 DIAGNOSIS — T80212D Local infection due to central venous catheter, subsequent encounter: Secondary | ICD-10-CM | POA: Diagnosis not present

## 2021-05-20 DIAGNOSIS — Z9981 Dependence on supplemental oxygen: Secondary | ICD-10-CM | POA: Diagnosis not present

## 2021-05-20 MED ORDER — PANTOPRAZOLE SODIUM 40 MG PO TBEC: 40.0000 mg | DELAYED_RELEASE_TABLET | Freq: Every day | ORAL | 1 refills | Status: DC

## 2021-05-20 MED ORDER — TIRZEPATIDE 2.5 MG/0.5ML ~~LOC~~ SOAJ
SUBCUTANEOUS | 0 refills | Status: DC
  Filled 2021-05-20: qty 14, 118d supply, fill #0

## 2021-05-20 MED ORDER — TIRZEPATIDE 2.5 MG/0.5ML ~~LOC~~ SOAJ: SUBCUTANEOUS | 0 refills | Status: DC

## 2021-05-20 NOTE — Assessment & Plan Note (Addendum)
Patient recently received antibiotics during his hospital admission and has been having abdominal pain since Saturday. Monday the pain progressed to diarrhea and today (Wednesday) he has had 8 bowel movements by the time he came in for his appointment. Nobody else at home is sick, he has not had any abnormal foods/diet changes, no fever or chills, no nausea or vomiting. ? ?A/P ?I am concerned for possible CDiff. Will get lab work and stool sample if possible, however patient and wife do not want to stay to do this today despite encouragement. I have given them the collection kit so they can do this at home and bring it back in if they are able. ?- GI PCR, CDiff, CBC, BMP ? ?Addendum: CDiff and GI PCR negative, however creatinine elevated on BMP. Called patient to recommend a recheck however his cardiologist instructed him to go to the ER after he started retaining fluid yesterday. Upon phone conversation he was on his way to the ER. ?

## 2021-05-20 NOTE — Patient Instructions (Signed)
Jonathan [name] ? ?It was a pleasure seeing you in the clinic today.  ? ?We talked about your diabetes and your abdominal pain ? ?Abdominal pain- given the diarrhea you are having and the recent antibiotics you took I would like to rule out Cdiff. We gave you a kit to collect a stool sample. ?2. Diabetes- I placed an endocrinology referral at your request. I also resent the prescription for tirzepatide to work on getting that.  ? ?If you abdominal pain persists or gets worse please let us know. If you have worsening dizziness, difficulty breathing or chest pain please seek emergency medical attention. ? ?Please call our clinic at [phone] if you have any questions or concerns. The best time to call is Monday-Friday from 9am-4pm, but there is someone available 24/7 at the same number. If you need medication refills, please notify your pharmacy one week in advance and they will send [place] a request. ?  ?Thank you for letting us take part in your care. We look forward to seeing you next time! ? ?

## 2021-05-20 NOTE — Assessment & Plan Note (Addendum)
Patient has been stable at follow ups with cardiologist. His weight is up to 362 today in the setting of acute abdominal pain and diarrheal illness. Will not adjust medications until CDiff/GI pathogen can be ruled out, however hie is up 25 lbs from his hospital dry weight. Breathing appropriately on room air, lungs are clear. ?- recheck at next visit ?- recheck BMP today ? ?Addendum: ?-creatinine elevated on BMP, likely due to dehydration due to diarrhea however he is on his way to be evaluated at the ER where they will be able to assess his volume status ?

## 2021-05-20 NOTE — Progress Notes (Signed)
? ?  CC: abdominal pain ? ?HPI: ? ?[name] is a 36 y.o. PMH noted below, who presents to the [facility] with complaints of abdominal pain. To see the management of his acute and chronic conditions, please refer to the A&P note under the encounters tab.  ? ?Past Medical History:  ?Diagnosis Date  ? Acute exacerbation of CHF (congestive heart failure) (HCC) 04/05/2021  ? Acute on chronic heart failure (HCC) 10/28/2020  ? Acute pain of left foot 05/03/2017  ? Asthma   ? Asthma, chronic, mild persistent, uncomplicated 10/26/2016  ? Charcot ankle, left 05/31/2017  ? CHF (congestive heart failure) (HCC)   ? Chronic systolic (congestive) heart failure (HCC)   ? Diabetes mellitus (HCC)   ? Diabetes mellitus type 2 in obese (HCC) 04/21/2014  ? Diabetic polyneuropathy associated with type 2 diabetes mellitus (HCC) 03/14/2017  ? Essential hypertension 10/26/2016  ? Hypertension   ? Obesity   ? Obesity, Class III, BMI 40-49.9 (morbid obesity) (HCC) 10/26/2016  ? OSA (obstructive sleep apnea) 12/02/2016  ? Type 2 diabetes mellitus with diabetic neuropathy (HCC)   ? ?Review of Systems:  positive for abdominal pain, nausea, diarrhea, negative for fever, chills ? ?Physical Exam: ?Gen: obese male uncomfortable but in NAD ?HEENT: normocephalic atraumatic, MMM, neck supple ?CV: tachycardic, regular rhythm, no m/r/g   ?Resp: CTAB, normal WOB ?GI: soft, distended, tender diffusely but worse in the epigastric and LLQ ?MSK: moves all extremities without difficulty ?Skin:warm and dry, legs in wraps ?Neuro:alert answering questions appropriately ?Psych: normal affect ? ? ?Assessment & Plan:  ? ?See Encounters Tab for problem based charting. ? ?Patient discussed with Dr. Mayford Knife  ? ?

## 2021-05-20 NOTE — Assessment & Plan Note (Addendum)
Patient has still not yet received tirzepatide. Discussed with the front desk that we have not received a request for prior auth and it is not on his med list.  ? ?A/P ?-represcribe tirzepatide ?- continue with insulin and trulicity for now ?- endocrinology referral at request of patient ?- defer foot exam until next visit as legs are wrapped today ?

## 2021-05-20 NOTE — Assessment & Plan Note (Signed)
Attempting to get tirzepatide (see diabetes) ?

## 2021-05-21 ENCOUNTER — Other Ambulatory Visit: Payer: Self-pay | Admitting: Internal Medicine

## 2021-05-21 DIAGNOSIS — F419 Anxiety disorder, unspecified: Secondary | ICD-10-CM | POA: Diagnosis not present

## 2021-05-21 DIAGNOSIS — J302 Other seasonal allergic rhinitis: Secondary | ICD-10-CM | POA: Diagnosis not present

## 2021-05-21 DIAGNOSIS — Z9981 Dependence on supplemental oxygen: Secondary | ICD-10-CM | POA: Diagnosis not present

## 2021-05-21 DIAGNOSIS — E669 Obesity, unspecified: Secondary | ICD-10-CM | POA: Diagnosis not present

## 2021-05-21 DIAGNOSIS — J45909 Unspecified asthma, uncomplicated: Secondary | ICD-10-CM | POA: Diagnosis not present

## 2021-05-21 DIAGNOSIS — M545 Low back pain, unspecified: Secondary | ICD-10-CM | POA: Diagnosis not present

## 2021-05-21 DIAGNOSIS — R197 Diarrhea, unspecified: Secondary | ICD-10-CM | POA: Diagnosis not present

## 2021-05-21 DIAGNOSIS — I5022 Chronic systolic (congestive) heart failure: Secondary | ICD-10-CM | POA: Diagnosis not present

## 2021-05-21 DIAGNOSIS — G4733 Obstructive sleep apnea (adult) (pediatric): Secondary | ICD-10-CM | POA: Diagnosis not present

## 2021-05-21 DIAGNOSIS — L304 Erythema intertrigo: Secondary | ICD-10-CM | POA: Diagnosis not present

## 2021-05-21 DIAGNOSIS — E1142 Type 2 diabetes mellitus with diabetic polyneuropathy: Secondary | ICD-10-CM | POA: Diagnosis not present

## 2021-05-21 DIAGNOSIS — F32A Depression, unspecified: Secondary | ICD-10-CM | POA: Diagnosis not present

## 2021-05-21 DIAGNOSIS — T80212D Local infection due to central venous catheter, subsequent encounter: Secondary | ICD-10-CM | POA: Diagnosis not present

## 2021-05-21 DIAGNOSIS — I130 Hypertensive heart and chronic kidney disease with heart failure and stage 1 through stage 4 chronic kidney disease, or unspecified chronic kidney disease: Secondary | ICD-10-CM | POA: Diagnosis not present

## 2021-05-21 DIAGNOSIS — I25119 Atherosclerotic heart disease of native coronary artery with unspecified angina pectoris: Secondary | ICD-10-CM | POA: Diagnosis not present

## 2021-05-21 DIAGNOSIS — M14672 Charcot's joint, left ankle and foot: Secondary | ICD-10-CM | POA: Diagnosis not present

## 2021-05-21 DIAGNOSIS — I4892 Unspecified atrial flutter: Secondary | ICD-10-CM | POA: Diagnosis not present

## 2021-05-21 DIAGNOSIS — Z794 Long term (current) use of insulin: Secondary | ICD-10-CM | POA: Diagnosis not present

## 2021-05-21 DIAGNOSIS — J9691 Respiratory failure, unspecified with hypoxia: Secondary | ICD-10-CM | POA: Diagnosis not present

## 2021-05-21 NOTE — Progress Notes (Signed)
Internal Medicine Clinic Attending ° °Case discussed with Dr. DeMaio  At the time of the visit.  We reviewed the resident’s history and exam and pertinent patient test results.  I agree with the assessment, diagnosis, and plan of care documented in the resident’s note. ° ° °

## 2021-05-22 DIAGNOSIS — E1142 Type 2 diabetes mellitus with diabetic polyneuropathy: Secondary | ICD-10-CM | POA: Diagnosis not present

## 2021-05-22 DIAGNOSIS — G4733 Obstructive sleep apnea (adult) (pediatric): Secondary | ICD-10-CM | POA: Diagnosis not present

## 2021-05-22 DIAGNOSIS — Z794 Long term (current) use of insulin: Secondary | ICD-10-CM | POA: Diagnosis not present

## 2021-05-22 DIAGNOSIS — J45909 Unspecified asthma, uncomplicated: Secondary | ICD-10-CM | POA: Diagnosis not present

## 2021-05-22 DIAGNOSIS — I5022 Chronic systolic (congestive) heart failure: Secondary | ICD-10-CM | POA: Diagnosis not present

## 2021-05-22 DIAGNOSIS — I130 Hypertensive heart and chronic kidney disease with heart failure and stage 1 through stage 4 chronic kidney disease, or unspecified chronic kidney disease: Secondary | ICD-10-CM | POA: Diagnosis not present

## 2021-05-22 DIAGNOSIS — F32A Depression, unspecified: Secondary | ICD-10-CM | POA: Diagnosis not present

## 2021-05-22 DIAGNOSIS — Z9981 Dependence on supplemental oxygen: Secondary | ICD-10-CM | POA: Diagnosis not present

## 2021-05-22 DIAGNOSIS — I25119 Atherosclerotic heart disease of native coronary artery with unspecified angina pectoris: Secondary | ICD-10-CM | POA: Diagnosis not present

## 2021-05-22 DIAGNOSIS — M545 Low back pain, unspecified: Secondary | ICD-10-CM | POA: Diagnosis not present

## 2021-05-22 DIAGNOSIS — M14672 Charcot's joint, left ankle and foot: Secondary | ICD-10-CM | POA: Diagnosis not present

## 2021-05-22 DIAGNOSIS — F419 Anxiety disorder, unspecified: Secondary | ICD-10-CM | POA: Diagnosis not present

## 2021-05-22 DIAGNOSIS — L304 Erythema intertrigo: Secondary | ICD-10-CM | POA: Diagnosis not present

## 2021-05-22 DIAGNOSIS — J9691 Respiratory failure, unspecified with hypoxia: Secondary | ICD-10-CM | POA: Diagnosis not present

## 2021-05-22 DIAGNOSIS — J302 Other seasonal allergic rhinitis: Secondary | ICD-10-CM | POA: Diagnosis not present

## 2021-05-22 DIAGNOSIS — I4892 Unspecified atrial flutter: Secondary | ICD-10-CM | POA: Diagnosis not present

## 2021-05-22 DIAGNOSIS — E669 Obesity, unspecified: Secondary | ICD-10-CM | POA: Diagnosis not present

## 2021-05-22 DIAGNOSIS — T80212D Local infection due to central venous catheter, subsequent encounter: Secondary | ICD-10-CM | POA: Diagnosis not present

## 2021-05-22 LAB — BASIC METABOLIC PANEL
BUN/Creatinine Ratio: 33 — ABNORMAL HIGH (ref 9–20)
BUN: 59 mg/dL — ABNORMAL HIGH (ref 6–20)
CO2: 22 mmol/L (ref 20–29)
Calcium: 9.6 mg/dL (ref 8.7–10.2)
Chloride: 86 mmol/L — ABNORMAL LOW (ref 96–106)
Creatinine, Ser: 1.79 mg/dL — ABNORMAL HIGH (ref 0.76–1.27)
Glucose: 157 mg/dL — ABNORMAL HIGH (ref 70–99)
Potassium: 4.3 mmol/L (ref 3.5–5.2)
Sodium: 127 mmol/L — ABNORMAL LOW (ref 134–144)
eGFR: 50 mL/min/{1.73_m2} — ABNORMAL LOW (ref >59)

## 2021-05-22 LAB — CBC WITH DIFFERENTIAL/PLATELET
Basophils Absolute: 0 10*3/uL (ref 0.0–0.2)
Basos: 0 %
EOS (ABSOLUTE): 0 10*3/uL (ref 0.0–0.4)
Eos: 0 %
Hematocrit: 35.9 % — ABNORMAL LOW (ref 37.5–51.0)
Hemoglobin: 11.9 g/dL — ABNORMAL LOW (ref 13.0–17.7)
Immature Grans (Abs): 0 10*3/uL (ref 0.0–0.1)
Immature Granulocytes: 0 %
Lymphocytes Absolute: 1.6 10*3/uL (ref 0.7–3.1)
Lymphs: 21 %
MCH: 30.1 pg (ref 26.6–33.0)
MCHC: 33.1 g/dL (ref 31.5–35.7)
MCV: 91 fL (ref 79–97)
Monocytes Absolute: 0.8 10*3/uL (ref 0.1–0.9)
Monocytes: 11 %
Neutrophils Absolute: 5.1 10*3/uL (ref 1.4–7.0)
Neutrophils: 68 %
Platelets: 364 10*3/uL (ref 150–450)
RBC: 3.96 x10E6/uL — ABNORMAL LOW (ref 4.14–5.80)
RDW: 15.8 % — ABNORMAL HIGH (ref 11.6–15.4)
WBC: 7.6 10*3/uL (ref 3.4–10.8)

## 2021-05-23 DIAGNOSIS — M14672 Charcot's joint, left ankle and foot: Secondary | ICD-10-CM | POA: Diagnosis not present

## 2021-05-23 DIAGNOSIS — J45909 Unspecified asthma, uncomplicated: Secondary | ICD-10-CM | POA: Diagnosis not present

## 2021-05-23 DIAGNOSIS — T80212D Local infection due to central venous catheter, subsequent encounter: Secondary | ICD-10-CM | POA: Diagnosis not present

## 2021-05-23 DIAGNOSIS — E669 Obesity, unspecified: Secondary | ICD-10-CM | POA: Diagnosis not present

## 2021-05-23 DIAGNOSIS — J302 Other seasonal allergic rhinitis: Secondary | ICD-10-CM | POA: Diagnosis not present

## 2021-05-23 DIAGNOSIS — Z794 Long term (current) use of insulin: Secondary | ICD-10-CM | POA: Diagnosis not present

## 2021-05-23 DIAGNOSIS — M545 Low back pain, unspecified: Secondary | ICD-10-CM | POA: Diagnosis not present

## 2021-05-23 DIAGNOSIS — I25119 Atherosclerotic heart disease of native coronary artery with unspecified angina pectoris: Secondary | ICD-10-CM | POA: Diagnosis not present

## 2021-05-23 DIAGNOSIS — I4892 Unspecified atrial flutter: Secondary | ICD-10-CM | POA: Diagnosis not present

## 2021-05-23 DIAGNOSIS — E1142 Type 2 diabetes mellitus with diabetic polyneuropathy: Secondary | ICD-10-CM | POA: Diagnosis not present

## 2021-05-23 DIAGNOSIS — G4733 Obstructive sleep apnea (adult) (pediatric): Secondary | ICD-10-CM | POA: Diagnosis not present

## 2021-05-23 DIAGNOSIS — Z9981 Dependence on supplemental oxygen: Secondary | ICD-10-CM | POA: Diagnosis not present

## 2021-05-23 DIAGNOSIS — J9691 Respiratory failure, unspecified with hypoxia: Secondary | ICD-10-CM | POA: Diagnosis not present

## 2021-05-23 DIAGNOSIS — I130 Hypertensive heart and chronic kidney disease with heart failure and stage 1 through stage 4 chronic kidney disease, or unspecified chronic kidney disease: Secondary | ICD-10-CM | POA: Diagnosis not present

## 2021-05-23 DIAGNOSIS — I5022 Chronic systolic (congestive) heart failure: Secondary | ICD-10-CM | POA: Diagnosis not present

## 2021-05-23 DIAGNOSIS — F419 Anxiety disorder, unspecified: Secondary | ICD-10-CM | POA: Diagnosis not present

## 2021-05-23 DIAGNOSIS — L304 Erythema intertrigo: Secondary | ICD-10-CM | POA: Diagnosis not present

## 2021-05-23 DIAGNOSIS — F32A Depression, unspecified: Secondary | ICD-10-CM | POA: Diagnosis not present

## 2021-05-23 LAB — GI PROFILE, STOOL, PCR

## 2021-05-23 LAB — CLOSTRIDIUM DIFFICILE EIA: C difficile Toxins A+B, EIA: NEGATIVE

## 2021-05-24 DIAGNOSIS — I25119 Atherosclerotic heart disease of native coronary artery with unspecified angina pectoris: Secondary | ICD-10-CM | POA: Diagnosis not present

## 2021-05-24 DIAGNOSIS — F419 Anxiety disorder, unspecified: Secondary | ICD-10-CM | POA: Diagnosis not present

## 2021-05-24 DIAGNOSIS — J45909 Unspecified asthma, uncomplicated: Secondary | ICD-10-CM | POA: Diagnosis not present

## 2021-05-24 DIAGNOSIS — G4733 Obstructive sleep apnea (adult) (pediatric): Secondary | ICD-10-CM | POA: Diagnosis not present

## 2021-05-24 DIAGNOSIS — I130 Hypertensive heart and chronic kidney disease with heart failure and stage 1 through stage 4 chronic kidney disease, or unspecified chronic kidney disease: Secondary | ICD-10-CM | POA: Diagnosis not present

## 2021-05-24 DIAGNOSIS — T80212D Local infection due to central venous catheter, subsequent encounter: Secondary | ICD-10-CM | POA: Diagnosis not present

## 2021-05-24 DIAGNOSIS — J9691 Respiratory failure, unspecified with hypoxia: Secondary | ICD-10-CM | POA: Diagnosis not present

## 2021-05-24 DIAGNOSIS — I5022 Chronic systolic (congestive) heart failure: Secondary | ICD-10-CM | POA: Diagnosis not present

## 2021-05-24 DIAGNOSIS — E669 Obesity, unspecified: Secondary | ICD-10-CM | POA: Diagnosis not present

## 2021-05-24 DIAGNOSIS — F32A Depression, unspecified: Secondary | ICD-10-CM | POA: Diagnosis not present

## 2021-05-24 DIAGNOSIS — Z794 Long term (current) use of insulin: Secondary | ICD-10-CM | POA: Diagnosis not present

## 2021-05-24 DIAGNOSIS — I4892 Unspecified atrial flutter: Secondary | ICD-10-CM | POA: Diagnosis not present

## 2021-05-24 DIAGNOSIS — J302 Other seasonal allergic rhinitis: Secondary | ICD-10-CM | POA: Diagnosis not present

## 2021-05-24 DIAGNOSIS — M14672 Charcot's joint, left ankle and foot: Secondary | ICD-10-CM | POA: Diagnosis not present

## 2021-05-24 DIAGNOSIS — E1142 Type 2 diabetes mellitus with diabetic polyneuropathy: Secondary | ICD-10-CM | POA: Diagnosis not present

## 2021-05-24 DIAGNOSIS — L304 Erythema intertrigo: Secondary | ICD-10-CM | POA: Diagnosis not present

## 2021-05-24 DIAGNOSIS — Z9981 Dependence on supplemental oxygen: Secondary | ICD-10-CM | POA: Diagnosis not present

## 2021-05-24 DIAGNOSIS — M545 Low back pain, unspecified: Secondary | ICD-10-CM | POA: Diagnosis not present

## 2021-05-25 DIAGNOSIS — Z9981 Dependence on supplemental oxygen: Secondary | ICD-10-CM | POA: Diagnosis not present

## 2021-05-25 DIAGNOSIS — E669 Obesity, unspecified: Secondary | ICD-10-CM | POA: Diagnosis not present

## 2021-05-25 DIAGNOSIS — J45909 Unspecified asthma, uncomplicated: Secondary | ICD-10-CM | POA: Diagnosis not present

## 2021-05-25 DIAGNOSIS — I4892 Unspecified atrial flutter: Secondary | ICD-10-CM | POA: Diagnosis not present

## 2021-05-25 DIAGNOSIS — E1142 Type 2 diabetes mellitus with diabetic polyneuropathy: Secondary | ICD-10-CM | POA: Diagnosis not present

## 2021-05-25 DIAGNOSIS — L304 Erythema intertrigo: Secondary | ICD-10-CM | POA: Diagnosis not present

## 2021-05-25 DIAGNOSIS — F419 Anxiety disorder, unspecified: Secondary | ICD-10-CM | POA: Diagnosis not present

## 2021-05-25 DIAGNOSIS — G4733 Obstructive sleep apnea (adult) (pediatric): Secondary | ICD-10-CM | POA: Diagnosis not present

## 2021-05-25 DIAGNOSIS — T80212D Local infection due to central venous catheter, subsequent encounter: Secondary | ICD-10-CM | POA: Diagnosis not present

## 2021-05-25 DIAGNOSIS — M14672 Charcot's joint, left ankle and foot: Secondary | ICD-10-CM | POA: Diagnosis not present

## 2021-05-25 DIAGNOSIS — Z794 Long term (current) use of insulin: Secondary | ICD-10-CM | POA: Diagnosis not present

## 2021-05-25 DIAGNOSIS — M545 Low back pain, unspecified: Secondary | ICD-10-CM | POA: Diagnosis not present

## 2021-05-25 DIAGNOSIS — F32A Depression, unspecified: Secondary | ICD-10-CM | POA: Diagnosis not present

## 2021-05-25 DIAGNOSIS — J9691 Respiratory failure, unspecified with hypoxia: Secondary | ICD-10-CM | POA: Diagnosis not present

## 2021-05-25 DIAGNOSIS — I25119 Atherosclerotic heart disease of native coronary artery with unspecified angina pectoris: Secondary | ICD-10-CM | POA: Diagnosis not present

## 2021-05-25 DIAGNOSIS — I130 Hypertensive heart and chronic kidney disease with heart failure and stage 1 through stage 4 chronic kidney disease, or unspecified chronic kidney disease: Secondary | ICD-10-CM | POA: Diagnosis not present

## 2021-05-25 DIAGNOSIS — I5022 Chronic systolic (congestive) heart failure: Secondary | ICD-10-CM | POA: Diagnosis not present

## 2021-05-25 DIAGNOSIS — J302 Other seasonal allergic rhinitis: Secondary | ICD-10-CM | POA: Diagnosis not present

## 2021-05-26 ENCOUNTER — Other Ambulatory Visit: Payer: Self-pay

## 2021-05-26 ENCOUNTER — Telehealth (HOSPITAL_COMMUNITY): Payer: Self-pay | Admitting: *Deleted

## 2021-05-26 ENCOUNTER — Inpatient Hospital Stay (HOSPITAL_COMMUNITY)
Admission: EM | Admit: 2021-05-26 | Discharge: 2021-06-04 | DRG: 270 | Disposition: A | Discharge status: Hospice | Payer: Medicaid Other | Attending: Internal Medicine | Admitting: Internal Medicine

## 2021-05-26 ENCOUNTER — Emergency Department (HOSPITAL_COMMUNITY): Payer: Medicaid Other

## 2021-05-26 ENCOUNTER — Encounter (HOSPITAL_COMMUNITY): Payer: Self-pay

## 2021-05-26 ENCOUNTER — Other Ambulatory Visit: Payer: Self-pay | Admitting: Internal Medicine

## 2021-05-26 DIAGNOSIS — Z6841 Body Mass Index (BMI) 40.0 and over, adult: Secondary | ICD-10-CM

## 2021-05-26 DIAGNOSIS — N1832 Chronic kidney disease, stage 3b: Secondary | ICD-10-CM | POA: Diagnosis not present

## 2021-05-26 DIAGNOSIS — I5082 Biventricular heart failure: Secondary | ICD-10-CM | POA: Diagnosis present

## 2021-05-26 DIAGNOSIS — I480 Paroxysmal atrial fibrillation: Secondary | ICD-10-CM | POA: Diagnosis present

## 2021-05-26 DIAGNOSIS — G4733 Obstructive sleep apnea (adult) (pediatric): Secondary | ICD-10-CM | POA: Diagnosis present

## 2021-05-26 DIAGNOSIS — I5023 Acute on chronic systolic (congestive) heart failure: Secondary | ICD-10-CM | POA: Diagnosis present

## 2021-05-26 DIAGNOSIS — Z20822 Contact with and (suspected) exposure to covid-19: Secondary | ICD-10-CM | POA: Diagnosis not present

## 2021-05-26 DIAGNOSIS — J45909 Unspecified asthma, uncomplicated: Secondary | ICD-10-CM | POA: Diagnosis not present

## 2021-05-26 DIAGNOSIS — I4892 Unspecified atrial flutter: Secondary | ICD-10-CM | POA: Diagnosis not present

## 2021-05-26 DIAGNOSIS — J811 Chronic pulmonary edema: Secondary | ICD-10-CM | POA: Diagnosis not present

## 2021-05-26 DIAGNOSIS — I495 Sick sinus syndrome: Secondary | ICD-10-CM | POA: Diagnosis present

## 2021-05-26 DIAGNOSIS — I5022 Chronic systolic (congestive) heart failure: Secondary | ICD-10-CM | POA: Diagnosis not present

## 2021-05-26 DIAGNOSIS — E1122 Type 2 diabetes mellitus with diabetic chronic kidney disease: Secondary | ICD-10-CM | POA: Diagnosis not present

## 2021-05-26 DIAGNOSIS — E871 Hypo-osmolality and hyponatremia: Secondary | ICD-10-CM | POA: Diagnosis present

## 2021-05-26 DIAGNOSIS — R0602 Shortness of breath: Secondary | ICD-10-CM | POA: Diagnosis not present

## 2021-05-26 DIAGNOSIS — I5021 Acute systolic (congestive) heart failure: Secondary | ICD-10-CM | POA: Diagnosis present

## 2021-05-26 DIAGNOSIS — R570 Cardiogenic shock: Secondary | ICD-10-CM | POA: Diagnosis present

## 2021-05-26 DIAGNOSIS — L89899 Pressure ulcer of other site, unspecified stage: Secondary | ICD-10-CM | POA: Diagnosis present

## 2021-05-26 DIAGNOSIS — Z66 Do not resuscitate: Secondary | ICD-10-CM | POA: Diagnosis not present

## 2021-05-26 DIAGNOSIS — E669 Obesity, unspecified: Secondary | ICD-10-CM | POA: Diagnosis not present

## 2021-05-26 DIAGNOSIS — I5084 End stage heart failure: Secondary | ICD-10-CM | POA: Diagnosis not present

## 2021-05-26 DIAGNOSIS — Z79899 Other long term (current) drug therapy: Secondary | ICD-10-CM

## 2021-05-26 DIAGNOSIS — I081 Rheumatic disorders of both mitral and tricuspid valves: Secondary | ICD-10-CM | POA: Diagnosis not present

## 2021-05-26 DIAGNOSIS — R630 Anorexia: Secondary | ICD-10-CM | POA: Diagnosis present

## 2021-05-26 DIAGNOSIS — Z9981 Dependence on supplemental oxygen: Secondary | ICD-10-CM | POA: Diagnosis not present

## 2021-05-26 DIAGNOSIS — E1142 Type 2 diabetes mellitus with diabetic polyneuropathy: Secondary | ICD-10-CM | POA: Diagnosis present

## 2021-05-26 DIAGNOSIS — E1165 Type 2 diabetes mellitus with hyperglycemia: Secondary | ICD-10-CM | POA: Diagnosis present

## 2021-05-26 DIAGNOSIS — Z7901 Long term (current) use of anticoagulants: Secondary | ICD-10-CM

## 2021-05-26 DIAGNOSIS — F32A Depression, unspecified: Secondary | ICD-10-CM | POA: Diagnosis not present

## 2021-05-26 DIAGNOSIS — E876 Hypokalemia: Secondary | ICD-10-CM | POA: Diagnosis not present

## 2021-05-26 DIAGNOSIS — N471 Phimosis: Secondary | ICD-10-CM | POA: Diagnosis not present

## 2021-05-26 DIAGNOSIS — I517 Cardiomegaly: Secondary | ICD-10-CM | POA: Diagnosis not present

## 2021-05-26 DIAGNOSIS — Z794 Long term (current) use of insulin: Secondary | ICD-10-CM | POA: Diagnosis not present

## 2021-05-26 DIAGNOSIS — I110 Hypertensive heart disease with heart failure: Secondary | ICD-10-CM | POA: Diagnosis not present

## 2021-05-26 DIAGNOSIS — J439 Emphysema, unspecified: Secondary | ICD-10-CM | POA: Diagnosis not present

## 2021-05-26 DIAGNOSIS — I130 Hypertensive heart and chronic kidney disease with heart failure and stage 1 through stage 4 chronic kidney disease, or unspecified chronic kidney disease: Secondary | ICD-10-CM | POA: Diagnosis not present

## 2021-05-26 DIAGNOSIS — L304 Erythema intertrigo: Secondary | ICD-10-CM | POA: Diagnosis not present

## 2021-05-26 DIAGNOSIS — J9601 Acute respiratory failure with hypoxia: Secondary | ICD-10-CM | POA: Diagnosis present

## 2021-05-26 DIAGNOSIS — I428 Other cardiomyopathies: Secondary | ICD-10-CM | POA: Diagnosis not present

## 2021-05-26 DIAGNOSIS — I340 Nonrheumatic mitral (valve) insufficiency: Secondary | ICD-10-CM | POA: Diagnosis not present

## 2021-05-26 DIAGNOSIS — I472 Ventricular tachycardia, unspecified: Secondary | ICD-10-CM | POA: Diagnosis not present

## 2021-05-26 DIAGNOSIS — M14672 Charcot's joint, left ankle and foot: Secondary | ICD-10-CM | POA: Diagnosis not present

## 2021-05-26 DIAGNOSIS — R197 Diarrhea, unspecified: Secondary | ICD-10-CM | POA: Diagnosis present

## 2021-05-26 DIAGNOSIS — T80212D Local infection due to central venous catheter, subsequent encounter: Secondary | ICD-10-CM | POA: Diagnosis not present

## 2021-05-26 DIAGNOSIS — F419 Anxiety disorder, unspecified: Secondary | ICD-10-CM | POA: Diagnosis not present

## 2021-05-26 DIAGNOSIS — J9691 Respiratory failure, unspecified with hypoxia: Secondary | ICD-10-CM | POA: Diagnosis not present

## 2021-05-26 DIAGNOSIS — Z833 Family history of diabetes mellitus: Secondary | ICD-10-CM

## 2021-05-26 DIAGNOSIS — J302 Other seasonal allergic rhinitis: Secondary | ICD-10-CM | POA: Diagnosis not present

## 2021-05-26 DIAGNOSIS — L8990 Pressure ulcer of unspecified site, unspecified stage: Secondary | ICD-10-CM | POA: Insufficient documentation

## 2021-05-26 DIAGNOSIS — N179 Acute kidney failure, unspecified: Secondary | ICD-10-CM | POA: Diagnosis not present

## 2021-05-26 DIAGNOSIS — M545 Low back pain, unspecified: Secondary | ICD-10-CM | POA: Diagnosis not present

## 2021-05-26 DIAGNOSIS — E11610 Type 2 diabetes mellitus with diabetic neuropathic arthropathy: Secondary | ICD-10-CM | POA: Diagnosis present

## 2021-05-26 DIAGNOSIS — R6881 Early satiety: Secondary | ICD-10-CM | POA: Diagnosis present

## 2021-05-26 DIAGNOSIS — K5900 Constipation, unspecified: Secondary | ICD-10-CM | POA: Diagnosis present

## 2021-05-26 DIAGNOSIS — Z885 Allergy status to narcotic agent status: Secondary | ICD-10-CM

## 2021-05-26 DIAGNOSIS — Z841 Family history of disorders of kidney and ureter: Secondary | ICD-10-CM

## 2021-05-26 DIAGNOSIS — Z8249 Family history of ischemic heart disease and other diseases of the circulatory system: Secondary | ICD-10-CM

## 2021-05-26 DIAGNOSIS — I509 Heart failure, unspecified: Secondary | ICD-10-CM | POA: Diagnosis not present

## 2021-05-26 DIAGNOSIS — I25119 Atherosclerotic heart disease of native coronary artery with unspecified angina pectoris: Secondary | ICD-10-CM | POA: Diagnosis not present

## 2021-05-26 LAB — DIGOXIN LEVEL: Digoxin Level: 0.9 ng/mL (ref 0.8–2.0)

## 2021-05-26 LAB — COMPREHENSIVE METABOLIC PANEL
ALT: 23 U/L (ref 0–44)
AST: 25 U/L (ref 15–41)
Albumin: 3.4 g/dL — ABNORMAL LOW (ref 3.5–5.0)
Alkaline Phosphatase: 81 U/L (ref 38–126)
Anion gap: 13 (ref 5–15)
BUN: 51 mg/dL — ABNORMAL HIGH (ref 6–20)
CO2: 26 mmol/L (ref 22–32)
Calcium: 9.7 mg/dL (ref 8.9–10.3)
Chloride: 92 mmol/L — ABNORMAL LOW (ref 98–111)
Creatinine, Ser: 1.53 mg/dL — ABNORMAL HIGH (ref 0.61–1.24)
GFR, Estimated: >60 mL/min (ref >60)
Glucose, Bld: 168 mg/dL — ABNORMAL HIGH (ref 70–99)
Potassium: 4.1 mmol/L (ref 3.5–5.1)
Sodium: 131 mmol/L — ABNORMAL LOW (ref 135–145)
Total Bilirubin: 1 mg/dL (ref 0.3–1.2)
Total Protein: 7.8 g/dL (ref 6.5–8.1)

## 2021-05-26 LAB — URINALYSIS, ROUTINE W REFLEX MICROSCOPIC
Bilirubin Urine: NEGATIVE
Glucose, UA: NEGATIVE mg/dL
Hgb urine dipstick: NEGATIVE
Ketones, ur: NEGATIVE mg/dL
Leukocytes,Ua: NEGATIVE
Nitrite: NEGATIVE
Protein, ur: NEGATIVE mg/dL
Specific Gravity, Urine: 1.008 (ref 1.005–1.030)
pH: 7 (ref 5.0–8.0)

## 2021-05-26 LAB — CBC WITH DIFFERENTIAL/PLATELET
Abs Immature Granulocytes: 0 10*3/uL (ref 0.00–0.07)
Basophils Absolute: 0 10*3/uL (ref 0.0–0.1)
Basophils Relative: 0 %
Eosinophils Absolute: 0.2 10*3/uL (ref 0.0–0.5)
Eosinophils Relative: 3 %
HCT: 35.4 % — ABNORMAL LOW (ref 39.0–52.0)
Hemoglobin: 10.9 g/dL — ABNORMAL LOW (ref 13.0–17.0)
Lymphocytes Relative: 20 %
Lymphs Abs: 1.6 10*3/uL (ref 0.7–4.0)
MCH: 29.9 pg (ref 26.0–34.0)
MCHC: 30.8 g/dL (ref 30.0–36.0)
MCV: 97 fL (ref 80.0–100.0)
Monocytes Absolute: 0.4 10*3/uL (ref 0.1–1.0)
Monocytes Relative: 5 %
Neutro Abs: 5.8 10*3/uL (ref 1.7–7.7)
Neutrophils Relative %: 72 %
Platelets: 238 10*3/uL (ref 150–400)
RBC: 3.65 MIL/uL — ABNORMAL LOW (ref 4.22–5.81)
RDW: 16.6 % — ABNORMAL HIGH (ref 11.5–15.5)
WBC: 8.1 10*3/uL (ref 4.0–10.5)
nRBC: 0 % (ref 0.0–0.2)
nRBC: 0 /100 WBC

## 2021-05-26 LAB — LIPASE, BLOOD: Lipase: 22 U/L (ref 11–51)

## 2021-05-26 LAB — MAGNESIUM: Magnesium: 1.6 mg/dL — ABNORMAL LOW (ref 1.7–2.4)

## 2021-05-26 LAB — TROPONIN I (HIGH SENSITIVITY): Troponin I (High Sensitivity): 100 ng/L (ref <18)

## 2021-05-26 LAB — BRAIN NATRIURETIC PEPTIDE: B Natriuretic Peptide: 342.4 pg/mL — ABNORMAL HIGH (ref 0.0–100.0)

## 2021-05-26 MED ORDER — ALBUTEROL SULFATE (2.5 MG/3ML) 0.083% IN NEBU: 2.5000 mg | INHALATION_SOLUTION | RESPIRATORY_TRACT | Status: DC | PRN

## 2021-05-26 MED ORDER — ALPRAZOLAM 0.25 MG PO TABS
0.2500 mg | ORAL_TABLET | Freq: Two times a day (BID) | ORAL | Status: DC | PRN
  Administered 2021-05-30 – 2021-06-04 (×5): 0.25 mg via ORAL
  Filled 2021-05-26 (×5): qty 1

## 2021-05-26 MED ORDER — DIGOXIN 125 MCG PO TABS
0.1250 mg | ORAL_TABLET | Freq: Every day | ORAL | Status: DC
  Administered 2021-05-27 – 2021-05-28 (×2): 0.125 mg via ORAL
  Filled 2021-05-26 (×2): qty 1

## 2021-05-26 MED ORDER — AZELASTINE HCL 0.1 % NA SOLN
1.0000 | NASAL | Status: DC | PRN
  Filled 2021-05-26: qty 30

## 2021-05-26 MED ORDER — ONDANSETRON HCL 4 MG/2ML IJ SOLN
4.0000 mg | Freq: Once | INTRAMUSCULAR | Status: AC
  Administered 2021-05-26: 4 mg via INTRAVENOUS
  Filled 2021-05-26: qty 2

## 2021-05-26 MED ORDER — NITROGLYCERIN 0.3 MG SL SUBL: 0.3000 mg | SUBLINGUAL_TABLET | SUBLINGUAL | Status: DC | PRN

## 2021-05-26 MED ORDER — SENNA 8.6 MG PO TABS
1.0000 | ORAL_TABLET | Freq: Every day | ORAL | Status: DC | PRN
  Administered 2021-05-30: 8.6 mg via ORAL
  Filled 2021-05-26: qty 1

## 2021-05-26 MED ORDER — FUROSEMIDE 10 MG/ML IJ SOLN
30.0000 mg/h | INTRAVENOUS | Status: DC
  Administered 2021-05-26 – 2021-05-27 (×2): 10 mg/h via INTRAVENOUS
  Administered 2021-05-28 – 2021-05-29 (×4): 20 mg/h via INTRAVENOUS
  Administered 2021-05-29 – 2021-06-04 (×18): 30 mg/h via INTRAVENOUS
  Filled 2021-05-26 (×28): qty 20

## 2021-05-26 MED ORDER — MAGNESIUM GLUCONATE 500 MG PO TABS
500.0000 mg | ORAL_TABLET | Freq: Once | ORAL | Status: AC
  Administered 2021-05-26: 500 mg via ORAL
  Filled 2021-05-26: qty 1

## 2021-05-26 MED ORDER — SODIUM CHLORIDE 0.9 % IV SOLN: 250.0000 mL | INTRAVENOUS | Status: DC | PRN

## 2021-05-26 MED ORDER — ALBUTEROL SULFATE HFA 108 (90 BASE) MCG/ACT IN AERS: 2.0000 | INHALATION_SPRAY | RESPIRATORY_TRACT | Status: DC | PRN

## 2021-05-26 MED ORDER — FUROSEMIDE 10 MG/ML IJ SOLN
80.0000 mg | Freq: Every day | INTRAMUSCULAR | Status: DC
  Administered 2021-05-26: 80 mg via INTRAVENOUS
  Filled 2021-05-26: qty 8

## 2021-05-26 MED ORDER — ONDANSETRON HCL 4 MG/2ML IJ SOLN: 4.0000 mg | Freq: Four times a day (QID) | INTRAMUSCULAR | Status: DC | PRN

## 2021-05-26 MED ORDER — NITROGLYCERIN 0.4 MG SL SUBL: 0.4000 mg | SUBLINGUAL_TABLET | SUBLINGUAL | Status: DC | PRN

## 2021-05-26 MED ORDER — POTASSIUM CHLORIDE CRYS ER 20 MEQ PO TBCR
40.0000 meq | EXTENDED_RELEASE_TABLET | Freq: Two times a day (BID) | ORAL | Status: DC
  Administered 2021-05-26 – 2021-05-28 (×4): 40 meq via ORAL
  Filled 2021-05-26 (×5): qty 2

## 2021-05-26 MED ORDER — INSULIN ASPART 100 UNIT/ML IJ SOLN
0.0000 [IU] | Freq: Every day | INTRAMUSCULAR | Status: DC
  Administered 2021-05-29: 3 [IU] via SUBCUTANEOUS

## 2021-05-26 MED ORDER — SACUBITRIL-VALSARTAN 24-26 MG PO TABS
1.0000 | ORAL_TABLET | Freq: Two times a day (BID) | ORAL | Status: DC
  Administered 2021-05-26 – 2021-05-27 (×3): 1 via ORAL
  Filled 2021-05-26 (×5): qty 1

## 2021-05-26 MED ORDER — GABAPENTIN 300 MG PO CAPS
300.0000 mg | ORAL_CAPSULE | Freq: Two times a day (BID) | ORAL | Status: DC | PRN
  Administered 2021-06-03: 300 mg via ORAL
  Filled 2021-05-26: qty 1

## 2021-05-26 MED ORDER — PANTOPRAZOLE SODIUM 40 MG PO TBEC
40.0000 mg | DELAYED_RELEASE_TABLET | Freq: Every day | ORAL | Status: DC
  Administered 2021-05-27 – 2021-06-04 (×9): 40 mg via ORAL
  Filled 2021-05-26 (×9): qty 1

## 2021-05-26 MED ORDER — LORATADINE 10 MG PO TABS
10.0000 mg | ORAL_TABLET | Freq: Two times a day (BID) | ORAL | Status: DC | PRN
  Administered 2021-05-29 – 2021-06-03 (×7): 10 mg via ORAL
  Filled 2021-05-26 (×7): qty 1

## 2021-05-26 MED ORDER — SODIUM CHLORIDE 0.9% FLUSH
3.0000 mL | Freq: Two times a day (BID) | INTRAVENOUS | Status: DC
  Administered 2021-05-26 – 2021-06-04 (×10): 3 mL via INTRAVENOUS

## 2021-05-26 MED ORDER — ISOSORB DINITRATE-HYDRALAZINE 20-37.5 MG PO TABS
1.0000 | ORAL_TABLET | Freq: Three times a day (TID) | ORAL | Status: DC
  Administered 2021-05-26 – 2021-05-28 (×5): 1 via ORAL
  Filled 2021-05-26 (×8): qty 1

## 2021-05-26 MED ORDER — ACETAMINOPHEN 325 MG PO TABS
650.0000 mg | ORAL_TABLET | ORAL | Status: DC | PRN
  Administered 2021-05-27 – 2021-06-04 (×12): 650 mg via ORAL
  Filled 2021-05-26 (×12): qty 2

## 2021-05-26 MED ORDER — SODIUM CHLORIDE 0.9% FLUSH
3.0000 mL | INTRAVENOUS | Status: DC | PRN
  Administered 2021-05-26: 3 mL via INTRAVENOUS

## 2021-05-26 MED ORDER — SPIRONOLACTONE 25 MG PO TABS
25.0000 mg | ORAL_TABLET | Freq: Every day | ORAL | Status: DC
  Administered 2021-05-27: 25 mg via ORAL
  Filled 2021-05-26: qty 1

## 2021-05-26 MED ORDER — APIXABAN 5 MG PO TABS
5.0000 mg | ORAL_TABLET | Freq: Two times a day (BID) | ORAL | Status: DC
  Administered 2021-05-26 – 2021-05-28 (×4): 5 mg via ORAL
  Filled 2021-05-26 (×4): qty 1

## 2021-05-26 MED ORDER — INSULIN ASPART 100 UNIT/ML IJ SOLN
0.0000 [IU] | Freq: Three times a day (TID) | INTRAMUSCULAR | Status: DC
  Administered 2021-05-27: 7 [IU] via SUBCUTANEOUS
  Administered 2021-05-28: 4 [IU] via SUBCUTANEOUS
  Administered 2021-05-28 – 2021-05-30 (×6): 7 [IU] via SUBCUTANEOUS

## 2021-05-26 MED ORDER — ZOLPIDEM TARTRATE 5 MG PO TABS: 5.0000 mg | ORAL_TABLET | Freq: Every evening | ORAL | Status: DC | PRN

## 2021-05-26 MED ORDER — AMIODARONE HCL 100 MG PO TABS
100.0000 mg | ORAL_TABLET | Freq: Every day | ORAL | Status: DC
Start: 2021-05-27 — End: 2021-05-28
  Administered 2021-05-27: 100 mg via ORAL
  Filled 2021-05-26: qty 1

## 2021-05-26 MED ORDER — TRAMADOL HCL 50 MG PO TABS
50.0000 mg | ORAL_TABLET | Freq: Four times a day (QID) | ORAL | Status: DC | PRN
  Administered 2021-05-28 – 2021-06-04 (×8): 50 mg via ORAL
  Filled 2021-05-26 (×8): qty 1

## 2021-05-26 MED ORDER — HYOSCYAMINE SULFATE 0.125 MG SL SUBL
0.1250 mg | SUBLINGUAL_TABLET | SUBLINGUAL | Status: DC | PRN
  Filled 2021-05-26: qty 1

## 2021-05-26 NOTE — ED Provider Triage Note (Signed)
Emergency Medicine Provider Triage Evaluation Note ? ?Jonathan [name] , a 36 y.o. male  was evaluated in triage.  Pt complains of shortness of breath. ?He has recently had a GI illness according to chart review and his wife who is at bedside.  They called cardiology as his weight was up significantly.  He states that he was waiting for hospice to bring him Lasix however they decided come in. ? ?He reports that he has had no issues with his pump. ? ?His wife at bedside wanted to know if we could use his labs that were drawn Thursday. ? ? ?Physical Exam  ?BP 105/81 (BP Location: Right Arm)   Pulse (!) 119   Temp 98 ?F (36.7 ?C) (Oral)   Resp 17   SpO2 100%  ?Gen:   Awake, no distress   ?Resp:  Increased effort ?MSK:   Moves extremities without difficulty edema in bilateral lower extremities lower. ?Other:  Normal speech.  PICC line in place right arm. ? ?Medical Decision Making  ?Medically screening exam initiated at 4:20 PM.  Appropriate orders placed.  Michah Minton was informed that the remainder of the evaluation will be completed by another provider, this initial triage assessment does not replace that evaluation, and the importance of remaining in the ED until their evaluation is complete. ? ?Given that I anticipate patient will require IV Lasix I especially in the setting of GI illness I do not think that labs from about 5 days ago are adequate for his potassium. ?  ?Cristina Gong, PA-C ?05/26/21 1623 ? ?

## 2021-05-26 NOTE — ED Notes (Addendum)
Pt's PICC line will not pull back no matter what positioning this RN uses, also hard to flush. MD Tegeler aware of issues w/ obtaining CBC w/ diff. IV team consult to be ordered. ?

## 2021-05-26 NOTE — Telephone Encounter (Signed)
Pts wife called stating pt was short of breath,and has swelling in abdomen and both legs and feet. She asked if we could send an order for pt to have iv lasix at home with [facility] or if he could come in office. She also said pts weight is up 30lbs since his hospital discharge 3/1.  ? ?Routed to [facility] for advice ?

## 2021-05-26 NOTE — H&P (Signed)
?Cardiology Admission History and Physical:  ? ?Patient ID: Jonathan [name] ?MRN: [id number]; DOB: 1985-11-23  ? ?Admission date: 05/26/2021 ? ?PCP:  [name], MD ?  ?[facility] Providers ?Cardiologist:  Fransico Him, MD  ?Advanced Heart Failure:  Glori Bickers, MD     ? ? ?Chief Complaint: Shortness of breath ? ?Patient Profile:  ? ?Jonathan [name] is a 36 y.o. male with a history of chronic HFrEF on home milrinone 0.375 mcg, NICM, HTN, asthma, Afib on eliquis, CKD stage III, OSA not on CPAP, morbid obesity, and uncontrolled DM who is being seen 05/26/2021 for the evaluation of shortness of breath. ? ?History of Present Illness:  ? ?Mr. Milone is well known to our HF team. Patient is seen in the ED with his wife Investment banker, corporate) at bedside. Patient reports sudden onset multiple episodes watery diarrhea last week which has been gradually improving this week. His C. Diff was negative on 4/6. Patient started noticing acute on chronic worsening shortness of breath, PND, weight gain (20lbs) in the last 3 days with decreased energy, early satiety, abdominal fullness poor appetite and not feeling well. He called Dr. Clayborne Dana office today and recommended ED for further evaluation. Denies fever, chills, dizziness, syncope, cough,  chest discomfort, heart palpitations, nausea, vomiting, dysuria, diarrhea,  or any bleeding events. Currently, on room air, no acute distress, resting in his bed in the ED. Patient reports being compliant with his medications. ? ?He was recently admitted with sepsis on 2/29/23 for management of enterococcus faecalis and Staph epidermidis bacteremia. His last clinic visit was 05/13/21 reports weight at home 342pounds. ? ?ON admission ?UA negative for nitrite or LE, Na 131, Cr 1.53, LFT wnl, BNP 342 (was 460.5 a month ago), troponin 100 (at baseline), Mg 1.6, lipase (-) ?ECG pending ?CXR 1. Stable cardiomegaly. ?2. Emphysematous changes of upper lobes without evidence of focal ?consolidation or pleural  effusion. ?  ? ?Past Medical History:  ?Diagnosis Date  ? Acute exacerbation of CHF (congestive heart failure) ([place]) 04/05/2021  ? Acute on chronic heart failure ([place]) 10/28/2020  ? Acute pain of left foot 05/03/2017  ? Asthma   ? Asthma, chronic, mild persistent, uncomplicated 17/61/6073  ? Charcot ankle, left 05/31/2017  ? CHF (congestive heart failure) ([place])   ? Chronic systolic (congestive) heart failure (HCC)   ? Diabetes mellitus ([place])   ? Diabetes mellitus type 2 in obese ([place]) 04/21/2014  ? Diabetic polyneuropathy associated with type 2 diabetes mellitus ([place]) 03/14/2017  ? Essential hypertension 10/26/2016  ? Hypertension   ? Obesity   ? Obesity, Class III, BMI 40-49.9 (morbid obesity) ([place]) 10/26/2016  ? OSA (obstructive sleep apnea) 12/02/2016  ? Type 2 diabetes mellitus with diabetic neuropathy (HCC)   ? ? ?Past Surgical History:  ?Procedure Laterality Date  ? CARDIAC CATHETERIZATION    ? FOOT SURGERY    ? arch surgery  ? RIGHT/LEFT HEART CATH AND CORONARY ANGIOGRAPHY N/A 04/10/2021  ? Procedure: RIGHT/LEFT HEART CATH AND CORONARY ANGIOGRAPHY;  Surgeon: Jolaine Artist, MD;  Location: [facility];  Service: Cardiovascular;  Laterality: N/A;  ? TEE WITHOUT CARDIOVERSION N/A 04/09/2021  ? Procedure: TRANSESOPHAGEAL ECHOCARDIOGRAM (TEE);  Surgeon: Jolaine Artist, MD;  Location: [facility];  Service: Cardiovascular;  Laterality: N/A;  ?  ? ?Medications Prior to Admission: ?Prior to Admission medications   ?Medication Sig Start Date End Date Taking? Authorizing Provider  ?Accu-Chek Softclix Lancets lancets Check blood sugar 3x a day 06/23/20   Asencion Noble, MD  ?  albuterol (VENTOLIN HFA) 108 (90 Base) MCG/ACT inhaler Inhale 2 puffs into the lungs every 4 (four) hours as needed for wheezing or shortness of breath. 04/03/21   Kennith Gain, MD  ?amiodarone (PACERONE) 200 MG tablet TAKE 1/2 TABLET BY MOUTH DAILY 05/04/21   Rafael Bihari, FNP  ?apixaban (ELIQUIS) 5 MG TABS tablet  Take 1 tablet (5 mg total) by mouth 2 (two) times daily. 11/12/20   Farrel Gordon, DO  ?azelastine (ASTELIN) 0.1 % nasal spray Place into both nostrils as needed for rhinitis. Use in each nostril as directed    [provider]  ?BIDIL 20-37.5 MG tablet TAKE 1 TABLET BY MOUTH THREE TIMES A DAY ?Patient taking differently: Take 1 tablet by mouth 3 (three) times daily. 05/06/21   Bensimhon, Shaune Pascal, MD  ?blood glucose meter kit and supplies KIT Dispense based on patient and insurance preference. Use up to four times daily as directed. (FOR ICD-9 250.00, 250.01). For QAC - HS accuchecks. May switch to any brand. 10/29/16   Thurnell Lose, MD  ?Continuous Blood Gluc Sensor (FREESTYLE LIBRE 2 SENSOR) MISC Use to check blood sugar at least 6 times a day 12/08/20   Lacinda Axon, MD  ?[employer] SENNA 8.6 MG tablet Take 1 tablet by mouth daily as needed for constipation. 03/15/21   [provider]  ?cyclobenzaprine (FLEXERIL) 5 MG tablet Take 5 mg by mouth every 6 (six) hours as needed for muscle spasms. 03/15/21   [provider]  ?digoxin (LANOXIN) 0.125 MG tablet TAKE 1 TABLET BY MOUTH EVERY DAY 03/12/21   Larey Dresser, MD  ?Dulaglutide (TRULICITY) 1.5 KZ/6.0FU SOPN Inject 1 mg into the skin once a week. Patient takes every Monday.    [provider]  ?ENTRESTO 24-26 MG TAKE 1 TABLET BY MOUTH TWICE A DAY 05/04/21   [place], Maricela Bo, FNP  ?gabapentin (NEURONTIN) 300 MG capsule Take 300 mg by mouth as needed (pain).    [provider]  ?glucose blood (ACCU-CHEK GUIDE) test strip Check blood sugar 3 times per day 06/23/20   [name], MD  ?hyoscyamine (LEVSIN SL) 0.125 MG SL tablet Place 0.125 mg under the tongue every 4 (four) hours as needed for cramping (secretions). 12/14/20   [provider]  ?insulin aspart (NOVOLOG) 100 UNIT/ML injection Inject 20 Units into the skin 3 (three) times daily with meals. 11/12/20   Farrel Gordon, DO  ?insulin glargine (LANTUS) 100  UNIT/ML injection Inject 0.3 mLs (30 Units total) into the skin 2 (two) times daily. 11/12/20   Farrel Gordon, DO  ?Insulin Syringe-Needle U-100 30G X 1/2" 1 ML MISC Use to inject insulin 4 times a day. 11/12/20   Sid Falcon, MD  ?loratadine (CLARITIN) 10 MG tablet Take 1 tablet (10 mg total) by mouth 2 (two) times daily as needed for allergies (Can use an extra dose during flare ups.). 01/14/21   Kennith Gain, MD  ?metolazone (ZAROXOLYN) 2.5 MG tablet Take 1 tablet (2.5 mg total) by mouth once a week. On Thursdays 04/29/21   Bensimhon, Shaune Pascal, MD  ?nitroGLYCERIN (NITROSTAT) 0.3 MG SL tablet Place 0.3 mg under the tongue every 5 (five) minutes as needed for chest pain. 02/27/21   [provider]  ?ondansetron (ZOFRAN) 4 MG tablet Take 4 mg by mouth daily as needed for nausea or vomiting. 12/14/20   [provider]  ?pantoprazole (PROTONIX) 40 MG tablet Take 1 tablet (40 mg total) by mouth daily. 05/20/21 07/19/21  Demaio, Alexa, MD  ?potassium chloride SA (KLOR-CON M) 20 MEQ tablet Take 2 tablets (40 mEq total) by mouth 2 (two) times daily. Take extra 40 meq (2 tabs) every Thursday when you take Metolazone 04/29/21 05/29/21  Bensimhon, Shaune Pascal, MD  ?spironolactone (ALDACTONE) 25 MG tablet Take 1 tablet (25 mg total) by mouth daily. 04/15/21 05/15/21  Delene Ruffini, MD  ?tirzepatide [facility]) 2.5 MG/0.5ML Pen INJECT 2.5 MG INTO THE SKIN ONCE A WEEK FOR 28 DAYS, THEN 5 MG ONCE A WEEK. 05/21/21   [name], MD  ?torsemide (DEMADEX) 20 MG tablet Take 4 tablets (80 mg total) by mouth 2 (two) times daily. 04/14/21 05/22/21  Delene Ruffini, MD  ?traMADol (ULTRAM) 50 MG tablet TAKE 1 TABLET BY MOUTH EVERY 6 HOURS AS NEEDED. 03/12/21   [name], MD  ?  ? ?Allergies:    ?Allergies  ?Allergen Reactions  ? Hydrocodone   ?  Hives   ? ? ?Social History:   ?Social History  ? ?Socioeconomic History  ? Marital status: Married  ?  Spouse name: Not on file  ? Number of children: Not on file  ?  Years of education: Not on file  ? Highest education level: Not on file  ?Occupational History  ? Not on file  ?Tobacco Use  ? Smoking status: Never  ? Smokeless tobacco: Never  ?Vaping Use  ? Vaping Use:

## 2021-05-26 NOTE — ED Notes (Signed)
Verlon Au, RN  from [facility] called and left her number requesting an update if pt were to be admitted. Is also available for any medical questions [phone] ?

## 2021-05-26 NOTE — ED Triage Notes (Signed)
Pt c/o intermittent SHOB, fluid retention x1.5. Pt reports weight gain of 20lbs, reports taking metolazone Friday ?Stomach bug approx 1.5wks ago, unsure if related ?Advised by Cardiologist (Bensimhon) to come to ED for diuresis. ?

## 2021-05-26 NOTE — ED Provider Notes (Signed)
?[facility] ?Provider Note ? ? ?CSN: [id number] ?Arrival date & time: 05/26/21  1441 ? ?  ? ?History ? ?Chief Complaint  ?Patient presents with  ? Shortness of Breath  ? ? ?Jonathan [name] is a 36 y.o. male with pertinent history of chronic heart failure with reduced EF (EF less than 20% 02/2021, type 2 diabetes melitis, CKD stage III, atrial flutter (on Eliquis).   ? ?Presents to the emergency department with a chief complaint of shortness of breath fluid retention.  Patient states that he has had 20 pound weight gain in the last 3 days.  Patient has been taking all of his medications as prescribed including his metolazone.  Reports that he has noticed swelling to bilateral lower extremities as well as to his abdomen.  Patient has been having orthopnea as well as paroxysmal nocturnal dyspnea despite wearing his CPAP machine.   ? ?Patient reports that he has also been dealing with diarrhea over the last few days.  Patient was found to be negative for C. difficile. ? ? ? ? ?Shortness of Breath ?Associated symptoms: no abdominal pain, no chest pain, no fever, no headaches, no neck pain, no rash and no vomiting   ? ?  ? ?Home Medications ?Prior to Admission medications   ?Medication Sig Start Date End Date Taking? Authorizing Provider  ?Accu-Chek Softclix Lancets lancets Check blood sugar 3x a day 06/23/20   Asencion Noble, MD  ?albuterol (VENTOLIN HFA) 108 (90 Base) MCG/ACT inhaler Inhale 2 puffs into the lungs every 4 (four) hours as needed for wheezing or shortness of breath. 04/03/21   Kennith Gain, MD  ?amiodarone (PACERONE) 200 MG tablet TAKE 1/2 TABLET BY MOUTH DAILY 05/04/21   Rafael Bihari, FNP  ?apixaban (ELIQUIS) 5 MG TABS tablet Take 1 tablet (5 mg total) by mouth 2 (two) times daily. 11/12/20   Farrel Gordon, DO  ?azelastine (ASTELIN) 0.1 % nasal spray Place into both nostrils as needed for rhinitis. Use in each nostril as directed    [provider]  ?BIDIL 20-37.5 MG tablet TAKE 1 TABLET BY MOUTH THREE TIMES A DAY 05/06/21   Bensimhon, Shaune Pascal, MD  ?blood glucose meter kit and supplies KIT Dispense based on patient and insurance preference. Use up to four times daily as directed. (FOR ICD-9 250.00, 250.01). For QAC - HS accuchecks. May switch to any brand. 10/29/16   Thurnell Lose, MD  ?Continuous Blood Gluc Sensor (FREESTYLE LIBRE 2 SENSOR) MISC Use to check blood sugar at least 6 times a day 12/08/20   Lacinda Axon, MD  ?[employer] SENNA 8.6 MG tablet Take 1 tablet by mouth daily as needed for constipation. 03/15/21   [provider]  ?cyclobenzaprine (FLEXERIL) 5 MG tablet Take 5 mg by mouth every 6 (six) hours as needed for muscle spasms. 03/15/21   [provider]  ?digoxin (LANOXIN) 0.125 MG tablet TAKE 1 TABLET BY MOUTH EVERY DAY 03/12/21   Larey Dresser, MD  ?Dulaglutide (TRULICITY) 1.5 YB/0.1BP SOPN Inject 1 mg into the skin once a week. Patient takes every Monday.    [provider]  ?ENTRESTO 24-26 MG TAKE 1 TABLET BY MOUTH TWICE A DAY 05/04/21   [place], Maricela Bo, FNP  ?gabapentin (NEURONTIN) 300 MG capsule Take 300 mg by mouth as needed (pain).    [provider]  ?glucose blood (ACCU-CHEK GUIDE) test strip Check blood sugar 3 times per day 06/23/20   [name],  MD  ?hyoscyamine (LEVSIN SL) 0.125 MG SL tablet Place 0.125 mg under the tongue every 4 (four) hours as needed for cramping (secretions). 12/14/20   [provider]  ?insulin aspart (NOVOLOG) 100 UNIT/ML injection Inject 20 Units into the skin 3 (three) times daily with meals. 11/12/20   Farrel Gordon, DO  ?insulin glargine (LANTUS) 100 UNIT/ML injection Inject 0.3 mLs (30 Units total) into the skin 2 (two) times daily. 11/12/20   Farrel Gordon, DO  ?Insulin Syringe-Needle U-100 30G X 1/2" 1 ML MISC Use to inject insulin 4 times a day. 11/12/20   Sid Falcon, MD  ?loratadine (CLARITIN) 10 MG tablet Take 1 tablet (10 mg total) by mouth 2  (two) times daily as needed for allergies (Can use an extra dose during flare ups.). 01/14/21   Kennith Gain, MD  ?metolazone (ZAROXOLYN) 2.5 MG tablet Take 1 tablet (2.5 mg total) by mouth once a week. On Thursdays 04/29/21   Bensimhon, Shaune Pascal, MD  ?nitroGLYCERIN (NITROSTAT) 0.3 MG SL tablet Place 0.3 mg under the tongue every 5 (five) minutes as needed for chest pain. 02/27/21   [provider]  ?ondansetron (ZOFRAN) 4 MG tablet Take 4 mg by mouth daily as needed for nausea or vomiting. 12/14/20   [provider]  ?pantoprazole (PROTONIX) 40 MG tablet Take 1 tablet (40 mg total) by mouth daily. 05/20/21 07/19/21  Demaio, Roxine Caddy, MD  ?potassium chloride SA (KLOR-CON M) 20 MEQ tablet Take 2 tablets (40 mEq total) by mouth 2 (two) times daily. Take extra 40 meq (2 tabs) every Thursday when you take Metolazone 04/29/21 05/29/21  Bensimhon, Shaune Pascal, MD  ?spironolactone (ALDACTONE) 25 MG tablet Take 1 tablet (25 mg total) by mouth daily. 04/15/21 05/15/21  Delene Ruffini, MD  ?tirzepatide [facility]) 2.5 MG/0.5ML Pen INJECT 2.5 MG INTO THE SKIN ONCE A WEEK FOR 28 DAYS, THEN 5 MG ONCE A WEEK. 05/21/21   [name], MD  ?torsemide (DEMADEX) 20 MG tablet Take 4 tablets (80 mg total) by mouth 2 (two) times daily. 04/14/21 05/22/21  Delene Ruffini, MD  ?traMADol (ULTRAM) 50 MG tablet TAKE 1 TABLET BY MOUTH EVERY 6 HOURS AS NEEDED. 03/12/21   [name], MD  ?   ? ?Allergies    ?Hydrocodone   ? ?Review of Systems   ?Review of Systems  ?Constitutional:  Negative for chills and fever.  ?Eyes:  Negative for visual disturbance.  ?Respiratory:  Positive for shortness of breath.   ?Cardiovascular:  Positive for leg swelling. Negative for chest pain and palpitations.  ?Gastrointestinal:  Positive for abdominal distention and diarrhea. Negative for abdominal pain, anal bleeding, blood in stool, constipation, nausea, rectal pain and vomiting.  ?Genitourinary:  Positive for frequency. Negative for  difficulty urinating, dysuria, hematuria and urgency.  ?Musculoskeletal:  Negative for back pain and neck pain.  ?Skin:  Negative for color change and rash.  ?Neurological:  Negative for dizziness, syncope, light-headedness and headaches.  ?Psychiatric/Behavioral:  Negative for confusion.   ? ?Physical Exam ?Updated Vital Signs ?BP 105/81 (BP Location: Right Arm)   Pulse (!) 119   Temp 98 ?F (36.7 ?C) (Oral)   Resp 17   SpO2 100%  ?Physical Exam ?Vitals and nursing note reviewed.  ?Constitutional:   ?   General: He is not in acute distress. ?   Appearance: He is morbidly obese. He is not ill-appearing, toxic-appearing or diaphoretic.  ?HENT:  ?   Head: Normocephalic.  ?Eyes:  ?   General: No  scleral icterus.    ?   Right eye: No discharge.     ?   Left eye: No discharge.  ?Cardiovascular:  ?   Rate and Rhythm: Normal rate.  ?Pulmonary:  ?   Effort: Pulmonary effort is normal. Tachypnea present. No bradypnea or respiratory distress.  ?   Breath sounds: No stridor. Examination of the right-lower field reveals decreased breath sounds and rales. Examination of the left-lower field reveals decreased breath sounds and rales. Decreased breath sounds and rales present. No wheezing or rhonchi.  ?   Comments: Subtle round decreased breath sounds to bilateral lower lobes. ?Abdominal:  ?   General: Abdomen is protuberant. There is no distension. There are no signs of injury.  ?   Palpations: Abdomen is soft. There is no mass or pulsatile mass.  ?   Tenderness: There is abdominal tenderness in the left lower quadrant. There is no guarding or rebound.  ?Musculoskeletal:  ?   Comments: Bilateral lower extremities are wrapped in [place]  ?Skin: ?   General: Skin is warm and dry.  ?Neurological:  ?   General: No focal deficit present.  ?   Mental Status: He is alert.  ?Psychiatric:     ?   Behavior: Behavior is cooperative.  ? ? ?ED Results / Procedures / Treatments   ?Labs ?(all labs ordered are listed, but only abnormal results  are displayed) ?Labs Reviewed  ?URINALYSIS, ROUTINE W REFLEX MICROSCOPIC  ?COMPREHENSIVE METABOLIC PANEL  ?CBC WITH DIFFERENTIAL/PLATELET  ?BRAIN NATRIURETIC PEPTIDE  ?LIPASE, BLOOD  ?MAGNESIUM  ?DIGOXIN LEVEL  ?

## 2021-05-27 ENCOUNTER — Other Ambulatory Visit: Payer: Self-pay | Admitting: Internal Medicine

## 2021-05-27 ENCOUNTER — Inpatient Hospital Stay (HOSPITAL_COMMUNITY): Payer: Medicaid Other

## 2021-05-27 ENCOUNTER — Other Ambulatory Visit: Payer: Self-pay

## 2021-05-27 DIAGNOSIS — J302 Other seasonal allergic rhinitis: Secondary | ICD-10-CM | POA: Diagnosis not present

## 2021-05-27 DIAGNOSIS — I5023 Acute on chronic systolic (congestive) heart failure: Secondary | ICD-10-CM | POA: Diagnosis present

## 2021-05-27 DIAGNOSIS — M14672 Charcot's joint, left ankle and foot: Secondary | ICD-10-CM | POA: Diagnosis not present

## 2021-05-27 DIAGNOSIS — F419 Anxiety disorder, unspecified: Secondary | ICD-10-CM | POA: Diagnosis not present

## 2021-05-27 DIAGNOSIS — G4733 Obstructive sleep apnea (adult) (pediatric): Secondary | ICD-10-CM | POA: Diagnosis not present

## 2021-05-27 DIAGNOSIS — R570 Cardiogenic shock: Secondary | ICD-10-CM | POA: Diagnosis not present

## 2021-05-27 DIAGNOSIS — J9691 Respiratory failure, unspecified with hypoxia: Secondary | ICD-10-CM | POA: Diagnosis not present

## 2021-05-27 DIAGNOSIS — I5084 End stage heart failure: Secondary | ICD-10-CM | POA: Diagnosis not present

## 2021-05-27 DIAGNOSIS — I5022 Chronic systolic (congestive) heart failure: Secondary | ICD-10-CM | POA: Diagnosis not present

## 2021-05-27 DIAGNOSIS — E1142 Type 2 diabetes mellitus with diabetic polyneuropathy: Secondary | ICD-10-CM | POA: Diagnosis not present

## 2021-05-27 DIAGNOSIS — I4892 Unspecified atrial flutter: Secondary | ICD-10-CM | POA: Diagnosis not present

## 2021-05-27 DIAGNOSIS — I25119 Atherosclerotic heart disease of native coronary artery with unspecified angina pectoris: Secondary | ICD-10-CM | POA: Diagnosis not present

## 2021-05-27 DIAGNOSIS — F32A Depression, unspecified: Secondary | ICD-10-CM | POA: Diagnosis not present

## 2021-05-27 DIAGNOSIS — Z9981 Dependence on supplemental oxygen: Secondary | ICD-10-CM | POA: Diagnosis not present

## 2021-05-27 DIAGNOSIS — J45909 Unspecified asthma, uncomplicated: Secondary | ICD-10-CM | POA: Diagnosis not present

## 2021-05-27 DIAGNOSIS — Z794 Long term (current) use of insulin: Secondary | ICD-10-CM | POA: Diagnosis not present

## 2021-05-27 DIAGNOSIS — M545 Low back pain, unspecified: Secondary | ICD-10-CM | POA: Diagnosis not present

## 2021-05-27 DIAGNOSIS — I130 Hypertensive heart and chronic kidney disease with heart failure and stage 1 through stage 4 chronic kidney disease, or unspecified chronic kidney disease: Secondary | ICD-10-CM | POA: Diagnosis not present

## 2021-05-27 DIAGNOSIS — E669 Obesity, unspecified: Secondary | ICD-10-CM | POA: Diagnosis not present

## 2021-05-27 DIAGNOSIS — T80212D Local infection due to central venous catheter, subsequent encounter: Secondary | ICD-10-CM | POA: Diagnosis not present

## 2021-05-27 DIAGNOSIS — L304 Erythema intertrigo: Secondary | ICD-10-CM | POA: Diagnosis not present

## 2021-05-27 LAB — BASIC METABOLIC PANEL
Anion gap: 12 (ref 5–15)
BUN: 52 mg/dL — ABNORMAL HIGH (ref 6–20)
CO2: 27 mmol/L (ref 22–32)
Calcium: 9.1 mg/dL (ref 8.9–10.3)
Chloride: 91 mmol/L — ABNORMAL LOW (ref 98–111)
Creatinine, Ser: 1.67 mg/dL — ABNORMAL HIGH (ref 0.61–1.24)
GFR, Estimated: 54 mL/min — ABNORMAL LOW (ref >60)
Glucose, Bld: 201 mg/dL — ABNORMAL HIGH (ref 70–99)
Potassium: 4.1 mmol/L (ref 3.5–5.1)
Sodium: 130 mmol/L — ABNORMAL LOW (ref 135–145)

## 2021-05-27 LAB — ECHOCARDIOGRAM COMPLETE
AR max vel: 2.26 cm2
AV Peak grad: 3.8 mmHg
Ao pk vel: 0.98 m/s
Area-P 1/2: 7.02 cm2
Calc EF: 20.7 %
MV M vel: 3.23 m/s
MV Peak grad: 41.7 mmHg
S' Lateral: 5.3 cm
Single Plane A2C EF: 23 %
Single Plane A4C EF: 22.2 %

## 2021-05-27 LAB — COOXEMETRY PANEL
Carboxyhemoglobin: 1.7 % — ABNORMAL HIGH (ref 0.5–1.5)
Methemoglobin: <0.7 % (ref 0.0–1.5)
O2 Saturation: 54.3 %
Total hemoglobin: 10.9 g/dL — ABNORMAL LOW (ref 12.0–16.0)

## 2021-05-27 LAB — GLUCOSE, CAPILLARY
Glucose-Capillary: 133 mg/dL — ABNORMAL HIGH (ref 70–99)
Glucose-Capillary: 171 mg/dL — ABNORMAL HIGH (ref 70–99)

## 2021-05-27 LAB — MAGNESIUM: Magnesium: 1.7 mg/dL (ref 1.7–2.4)

## 2021-05-27 LAB — MRSA NEXT GEN BY PCR, NASAL: MRSA by PCR Next Gen: NOT DETECTED

## 2021-05-27 MED ORDER — ALUM & MAG HYDROXIDE-SIMETH 200-200-20 MG/5ML PO SUSP
30.0000 mL | Freq: Once | ORAL | Status: AC
  Administered 2021-05-27: 30 mL via ORAL
  Filled 2021-05-27: qty 30

## 2021-05-27 MED ORDER — CHLORHEXIDINE GLUCONATE CLOTH 2 % EX PADS
6.0000 | MEDICATED_PAD | Freq: Every day | CUTANEOUS | Status: DC
  Administered 2021-05-27 – 2021-06-04 (×8): 6 via TOPICAL

## 2021-05-27 MED ORDER — MAGNESIUM SULFATE 2 GM/50ML IV SOLN
2.0000 g | Freq: Once | INTRAVENOUS | Status: AC
  Administered 2021-05-27: 2 g via INTRAVENOUS
  Filled 2021-05-27: qty 50

## 2021-05-27 MED ORDER — ALUM & MAG HYDROXIDE-SIMETH 200-200-20 MG/5ML PO SUSP
15.0000 mL | ORAL | Status: DC | PRN
  Filled 2021-05-27: qty 30

## 2021-05-27 MED ORDER — PERFLUTREN LIPID MICROSPHERE
1.0000 mL | INTRAVENOUS | Status: AC | PRN
  Administered 2021-05-27: 2 mL via INTRAVENOUS
  Filled 2021-05-27: qty 10

## 2021-05-27 MED ORDER — SIMETHICONE 80 MG PO CHEW
80.0000 mg | CHEWABLE_TABLET | Freq: Four times a day (QID) | ORAL | Status: DC | PRN
Start: 2021-05-27 — End: 2021-06-05
  Administered 2021-05-27 – 2021-06-04 (×9): 80 mg via ORAL
  Filled 2021-05-27 (×10): qty 1

## 2021-05-27 MED ORDER — CHLORHEXIDINE GLUCONATE CLOTH 2 % EX PADS: 6.0000 | MEDICATED_PAD | Freq: Every day | CUTANEOUS | Status: DC

## 2021-05-27 MED ORDER — MILRINONE LACTATE IN DEXTROSE 20-5 MG/100ML-% IV SOLN
0.5000 ug/kg/min | INTRAVENOUS | Status: DC
  Administered 2021-05-27 (×3): 0.375 ug/kg/min via INTRAVENOUS
  Administered 2021-05-28 (×3): 0.5 ug/kg/min via INTRAVENOUS
  Administered 2021-05-28: 0.375 ug/kg/min via INTRAVENOUS
  Administered 2021-05-28 – 2021-06-01 (×18): 0.5 ug/kg/min via INTRAVENOUS
  Administered 2021-06-02 (×3): 0.6 ug/kg/min via INTRAVENOUS
  Administered 2021-06-02: 0.5 ug/kg/min via INTRAVENOUS
  Administered 2021-06-02 (×2): 0.6 ug/kg/min via INTRAVENOUS
  Administered 2021-06-02: 0.5 ug/kg/min via INTRAVENOUS
  Administered 2021-06-03 – 2021-06-04 (×8): 0.6 ug/kg/min via INTRAVENOUS
  Administered 2021-06-04: 0.5 ug/kg/min via INTRAVENOUS
  Administered 2021-06-04: 0.6 ug/kg/min via INTRAVENOUS
  Administered 2021-06-04: 0.5 ug/kg/min via INTRAVENOUS
  Filled 2021-05-27 (×11): qty 100
  Filled 2021-05-27: qty 200
  Filled 2021-05-27 (×7): qty 100
  Filled 2021-05-27: qty 200
  Filled 2021-05-27: qty 100
  Filled 2021-05-27: qty 200
  Filled 2021-05-27 (×14): qty 100
  Filled 2021-05-27: qty 200
  Filled 2021-05-27 (×8): qty 100

## 2021-05-27 MED ORDER — ALUM & MAG HYDROXIDE-SIMETH 200-200-20 MG/5ML PO SUSP
30.0000 mL | ORAL | Status: DC | PRN
  Administered 2021-05-27 – 2021-05-31 (×7): 30 mL via ORAL
  Filled 2021-05-27 (×6): qty 30

## 2021-05-27 NOTE — ED Notes (Signed)
Patient refusing finger stick blood sugar. Patient has home monitor that is currently reading 230mg /dl ?

## 2021-05-27 NOTE — Progress Notes (Signed)
[facility]) hospitalized hospice patient. ? ?This is a current [facility] hospice home care patient with a terminal diagnosis of hypertensive heart disease with chronic systolic CHF.  Patient contacted Dr Haroldine Laws with complaints of weight gain of > 20lbs, lower extremity edema and abdomen. Advised by Dr. Haroldine Laws to go to [facility] ED for diuresis. Patient admitted on 05/26/2021 with diagnosis of end stage CHF.  Per Dr. Tomasa Hosteller with [facility] this is a hospice related admission. ? ?This patient is appropriate for inpatient status due to need for IV medications for symptom management.  ? ?Visited at bedside with wife present. Alert and oriented, no apparent distress. In ED waiting bed assignment. Pam from [facility] visited earlier to disconnect CADD Milrinone drip so patient can be medicated by [facility].  ? ?VS: 98.0, 123/94, 118, 16, 100% RA ?I/O: not documented ? ?Abnormal labs: ?05/27/21 11:57 ?Total hemoglobin: 10.9 (L) ?Carboxyhemoglobin: 1.7 (H) ?05/27/21 12:33 ?Sodium: 130 (L) ?Chloride: 91 (L) ?Glucose: 201 (H) ?BUN: 52 (H) ?Creatinine: 1.67 (H) ?05/27/21 12:33 ?GFR, Estimated: 54 (L) ?05/26/21 19:18 ?RBC: 3.65 (L) ?Hemoglobin: 10.9 (L) ?HCT: 35.4 (L) ?RDW: 16.6 (H) ?05/26/21 18:13 ?B Natriuretic Peptide: 342.4 (H) ? ?Diagnostics: ?CXR ?FINDINGS: ?The heart is enlarged. Mild pulmonary vascular congestion. ?Emphysematous changes of the upper lobes. No focal consolidation or ?pleural effusion. ?  ?IMPRESSION: ?1. Stable cardiomegaly. ?2. Emphysematous changes of upper lobes without evidence of focal ?consolidation or pleural effusion. ? ?IV/PRN medications: Lasix IV 10mg  /hr, magnesium sulfate IVPB 2g x1, milrinone Rate: 18.52 mL/hr Dose: 0.375 mcg/kg/min ? ?Problem list: ?Assessment/Plan  ?  ?1. A/C End Stage HFrEF , NICM  ?EF has been down for some time.   Echo 10/2020 EF < 20% and moderate RV dysfunction. He has been on home milrinone 0.375 since 9/22.  Not thought to be candidate for advanced  therapies with obesity and noncompliance. Echo in 1/23 reviewed, shows EF < 20%, mild LV dilation, severe RV dysfunction with moderate RV enlargement.  ?On home milrinone 0.375 mcg. CO-OX 54% today.  ?He is volume overloaded today and will need additional diuresis. Continue lasix drip at 10 mg per hour.  ?-Will set up CVP to guide diuresis  ?-No BB with low out put. ?- Continue spiro, entresto, bidil, and digoxin.  ?- No SGLT2i due to fungal infections.  ?-  At discharge suspect he will need IV lasix 1-2 times per week given by Hospice. Will need to discuss with [place] to determine if they can ben given IV lasix at home.   ?  ?2. DMII ?Will need SSI while hospitalized.  ?Check Hgb A1 C ?  ?3. CKD Stage IIIb  ?Creatinine baseline  ?Admit creatinine 1.5, follow bmet daily  ?  ?4. PAF ?Increase amio 200 mg twice a day  ?- Continue eliquis 5 mg twice a day  ?  ?5. OSA: Severe AHI 65 ?Does not uses CPAP  ?  ?6. Obesity  ?He was supposed to start [facility] but Medicaid denied it.  ?He has continued on Trulicity.  ?  ?7. ID ?H/O  2/23 Blood CX Enterococcus faecalis and Staph epidermidis bacteremia. Treated with IV antibiotics. ?  ?I have Personally called Dr Konrad Dolores, Medical Director with [facility] and this may be an option. We will need to verify we can get IV lasix from the [facility],.  ?  ?Discharge planning: return home with hospice when stable for discharge ?Family Contact: Wife at bedside ?IDG: updated ?GOC: on going. Remains  a full code.  ? ?Should patient need ambulance transport at discharge please use [facility] as [facility] contracts this service with them for our active hospice patients. ? ?A Please do not hesitate to call with questions.   ?Thank you,   ?Farrel Gordon, RN, CCM      ?[facility] Liaison   ?[phone] ?  ?

## 2021-05-27 NOTE — ED Notes (Signed)
Pt states insulin makes him not urinate and does not want insulin. Pt has a device that checks BS. It read 194. Pt refuses glucose stick.  ?

## 2021-05-27 NOTE — ED Notes (Signed)
RN attempted multiple different efforts to obtain labwork from PICC line. Pt refusing to be straight stuck even w/ PICC line not pulling back. Will attempt again to obtain labwork at later time. ?

## 2021-05-27 NOTE — ED Notes (Signed)
Pt is unavailable to take meds at this time. Pt getting EEG done.  ?

## 2021-05-27 NOTE — Consult Note (Addendum)
?  ?Advanced Heart Failure Team Consult Note ? ? ?Primary Physician: [name], MD ?PCP-Cardiologist:  Fransico Him, MD ? ?[place] in the community.  ? ?Reason for Consultation: A/C HFrEF  ? ?HPI:   ? ?Jonathan [name] is seen today for evaluation of heart failure  at the request of Dr Johnsie Cancel.  ? ?[name] is a 36 y.o.with a history of chronic HFrEF, NICM, HTN, asthma, OSA, morbid obesity, and uncontrolled DM. He is on home milrinone 0.375 mcg.  ?  ?Diagnosed with systolic HF in [place] [facility]. EF 15-20% ?  ?Repeat Echo 02/2017. EF had normalized to 55% but went back down when he was off HF meds. EF in 2021 back down to 30-35%. He has refused ICD.  ?  ?He has been followed in the HF clinic and was last seen 05/2020.  He cancelled follow up appointments 08/2020 and 09/2020.  ?  ?Admitted 10/28/20 with A/C HFrEF, A fib RVR , and marked volume overload. Hospital course complicated by low output so milrinone started. Diuresed with lasix drip + metolazone+ diamox.   Failed milrinone wean and was discharged on milrinone. He was not a candidate for advanced therapies due to noncompliance and uncontrolled diabetes. HGB A1C >14, Palliative consulted for goals of care. He elected DNR/DNI. Referred to [facility] for Hospice services.  ?  ?Seen in ED 11/25/20 with home milrinone pump issues.  ?  ?Admitted 04/05/21 with sepsis and A/C HFrEF.  Had been on home milrinone through PICC. Blood CX Enterococcus faecalis and Staph epidermidis bacteremia. Placed on ceftriaxone + ampicllin. PICC removed. Diuresed with IV lasix.HF meds adjusted. PICC line replaced once cultures clear. Discharged on 2 weeks of linezolid. Discharged on milrinone 0.375 mcg. Plan to consider transplant if he can lose weight. Discharge weight 337 pounds. ?  ?He was seen in the HF clinic 04/23/21 and was given 80 mg IV lasix. Volume overloaded in the setting of high sodium diet.  Returned the next week, volume mildly up and metolazone 2.5 + extra 40 KCL added  weekly. ? ?Saw PCP on 05/20/21 for abdominal pain. CDiff & GI PCR negative. Weight at that time was 25 pounds up.  ? ?Presented to [facility] with increased shortness of breath and nausea. He remains on milrinone 0.375 mcg. Weight at home had gone up 20 pounds. Taking all medications. No fever or chills. Nausea resolving after BM. CXR emphysematous changes of upper lobes. Hgb 10.9 , WBC 8.1, BNP 342, and creatinine 1.5. Given 80 mg IV lasix and started lasix drip at 10 mg per hour. He reports good response to IV lasix. I/O not recorded.  ? ?CO-OX today 54%.  ? ?Cardiac Studies ?- 02/2021 EF < 20% RV severely reduced.    ?- Echo 9/22: EF<20%, severely decreased LV, grade II DD.  ?- Echo 10/21: EF 30-35% with moderate RV dysfunction in setting of recurrent RBBB with significant dyssynchrony.  ?- Echo 04/2019: EF 30-35%  ?- Echo 2019: EF 55%   ?- Echo EF 15-20% in [place] in 2017. Cath around that time without CAD.  ? ? ?Review of Systems: [y] = yes, _0  = no  ? ?General: Weight gain _1 ; Weight loss _2 ; Anorexia _3 ; Fatigue [Y ]; Fever _4 ; Chills _5 ; Weakness [ Y]  ?Cardiac: Chest pain/pressure _6 ; Resting SOB _7 ; Exertional SOB [ Y]; Orthopnea _8 ; Pedal Edema [ Y]; Palpitations _9 ; Syncope _10 ; Presyncope _11 ; Paroxysmal nocturnal dyspnea_12   ?  Pulmonary: Cough _0 ; Wheezing_1 ; Hemoptysis_2 ; Sputum _3 ; Snoring _4   ?GI: Vomiting_5 ; Dysphagia_6 ; Melena_7 ; Hematochezia _8 ; Heartburn_9 ; Abdominal pain _10 ; Constipation [ Y]; Diarrhea [ Y]; BRBPR _11   ?GU: Hematuria_12 ; Dysuria _13 ; Nocturia_14   ?Vascular: Pain in legs with walking _15 ; Pain in feet with lying flat _16 ; Non-healing sores _17 ; Stroke _18 ; TIA _19 ; Slurred speech _20 ;  ?Neuro: Headaches_21 ; Vertigo_22 ; Seizures_23 ; Paresthesias_24 ;Blurred vision _25 ; Diplopia _26 ; Vision changes _27   ?Ortho/Skin: Arthritis _28 ; Joint pain [ Y]; Muscle pain _29 ; Joint swelling _30 ; Back Pain [Y ]; Rash _31   ?Psych: Depression_32 ; Anxiety_33   ?Heme: Bleeding problems _34 ;  Clotting disorders _35 ; Anemia _36   ?Endocrine: Diabetes [ Y]; Thyroid dysfunction_37  ? ?Home Medications ?Prior to Admission medications   ?Medication Sig Start Date End Date Taking? Authorizing Provider  ?albuterol (VENTOLIN HFA) 108 (90 Base) MCG/ACT inhaler Inhale 2 puffs into the lungs every 4 (four) hours as needed for wheezing or shortness of breath. 04/03/21  Yes Kennith Gain, MD  ?amiodarone (PACERONE) 200 MG tablet TAKE 1/2 TABLET BY MOUTH DAILY ?Patient taking differently: Take 100 mg by mouth daily. 05/04/21  Yes Milford, Maricela Bo, FNP  ?apixaban (ELIQUIS) 5 MG TABS tablet Take 1 tablet (5 mg total) by mouth 2 (two) times daily. 11/12/20  Yes Farrel Gordon, DO  ?azelastine (ASTELIN) 0.1 % nasal spray Place into both nostrils as needed for rhinitis. Use in each nostril as directed   Yes [provider]  ?BIDIL 20-37.5 MG tablet TAKE 1 TABLET BY MOUTH THREE TIMES A DAY ?Patient taking differently: Take 1 tablet by mouth 3 (three) times daily. 05/06/21  Yes Thaddius Manes, Shaune Pascal, MD  ?[employer] SENNA 8.6 MG tablet Take 1 tablet by mouth daily as needed for constipation. 03/15/21  Yes [provider]  ?digoxin (LANOXIN) 0.125 MG tablet TAKE 1 TABLET BY MOUTH EVERY DAY ?Patient taking differently: Take 0.125 mg by mouth daily. 03/12/21  Yes Larey Dresser, MD  ?Dulaglutide (TRULICITY) 1.5 XY/8.0XK SOPN Inject 1 mg into the skin once a week. Patient takes every Monday.   Yes [provider]  ?ENTRESTO 24-26 MG TAKE 1 TABLET BY MOUTH TWICE A DAY 05/04/21  Yes [place]wood, Mayagi¼ez, FNP  ?gabapentin (NEURONTIN) 300 MG capsule Take 300 mg by mouth as needed (pain).   Yes [provider]  ?hyoscyamine (LEVSIN SL) 0.125 MG SL tablet Place 0.125 mg under the tongue every 4 (four) hours as needed for cramping (secretions). 12/14/20  Yes [provider]  ?insulin aspart (NOVOLOG) 100 UNIT/ML injection Inject 20 Units into the skin 3 (three) times daily with meals. 11/12/20  Yes  Farrel Gordon, DO  ?insulin glargine (LANTUS) 100 UNIT/ML injection Inject 0.3 mLs (30 Units total) into the skin 2 (two) times daily. 11/12/20  Yes Farrel Gordon, DO  ?loratadine (CLARITIN) 10 MG tablet Take 1 tablet (10 mg total) by mouth 2 (two) times daily as needed for allergies (Can use an extra dose during flare ups.). 01/14/21  Yes Padgett, Rae Halsted, MD  ?metolazone (ZAROXOLYN) 2.5 MG tablet Take 1 tablet (2.5 mg total) by mouth once a week. On Thursdays 04/29/21  Yes Sahirah Rudell, Shaune Pascal, MD  ?nitroGLYCERIN (NITROSTAT) 0.3 MG SL tablet Place 0.3 mg under the tongue every 5 (five) minutes as needed for chest pain. 02/27/21  Yes [provider]  ?ondansetron (ZOFRAN) 4 MG tablet Take 4 mg by mouth daily as needed for nausea or vomiting. 12/14/20  Yes [provider]  ?pantoprazole (PROTONIX) 40 MG tablet Take 1 tablet (40 mg total) by mouth daily. 05/20/21 07/19/21 Yes Demaio, Alexa, MD  ?potassium chloride SA (KLOR-CON M) 20 MEQ tablet Take 2 tablets (40 mEq total) by mouth 2 (two) times daily. Take extra 40 meq (2 tabs) every Thursday when you take Metolazone 04/29/21 05/29/21 Yes Shivansh Hardaway, Shaune Pascal, MD  ?spironolactone (ALDACTONE) 25 MG tablet Take 1 tablet (25 mg total) by mouth daily. 04/15/21 05/26/21 Yes Delene Ruffini, MD  ?torsemide (DEMADEX) 20 MG tablet Take 4 tablets (80 mg total) by mouth 2 (two) times daily. 04/14/21 05/26/21 Yes Delene Ruffini, MD  ?traMADol (ULTRAM) 50 MG tablet TAKE 1 TABLET BY MOUTH EVERY 6 HOURS AS NEEDED. ?Patient taking differently: Take 50 mg by mouth every 6 (six) hours as needed for moderate pain. 03/12/21  Yes [name], MD  ?Accu-Chek Softclix Lancets lancets Check blood sugar 3x a day 06/23/20   Asencion Noble, MD  ?blood glucose meter kit and supplies KIT Dispense based on patient and insurance preference. Use up to four times daily as directed. (FOR ICD-9 250.00, 250.01). For QAC - HS accuchecks. May switch to any brand. 10/29/16   Thurnell Lose, MD  ?Continuous Blood Gluc Sensor (FREESTYLE LIBRE 2 SENSOR) MISC Use to check blood sugar at least 6 times a day 12/08/20   Lacinda Axon, MD  ?glucose blood (ACCU-CHEK GUIDE) test strip Check blood

## 2021-05-28 ENCOUNTER — Inpatient Hospital Stay (HOSPITAL_COMMUNITY)
Admission: EM | Disposition: A | Discharge status: Hospice | Payer: Self-pay | Source: Home / Self Care | Attending: Internal Medicine

## 2021-05-28 ENCOUNTER — Other Ambulatory Visit: Payer: Self-pay

## 2021-05-28 ENCOUNTER — Inpatient Hospital Stay (HOSPITAL_COMMUNITY): Payer: Medicaid Other

## 2021-05-28 ENCOUNTER — Telehealth: Payer: Self-pay

## 2021-05-28 DIAGNOSIS — M14672 Charcot's joint, left ankle and foot: Secondary | ICD-10-CM | POA: Diagnosis not present

## 2021-05-28 DIAGNOSIS — I5022 Chronic systolic (congestive) heart failure: Secondary | ICD-10-CM | POA: Diagnosis not present

## 2021-05-28 DIAGNOSIS — N471 Phimosis: Secondary | ICD-10-CM | POA: Diagnosis not present

## 2021-05-28 DIAGNOSIS — I472 Ventricular tachycardia, unspecified: Secondary | ICD-10-CM | POA: Diagnosis not present

## 2021-05-28 DIAGNOSIS — M545 Low back pain, unspecified: Secondary | ICD-10-CM | POA: Diagnosis not present

## 2021-05-28 DIAGNOSIS — I130 Hypertensive heart and chronic kidney disease with heart failure and stage 1 through stage 4 chronic kidney disease, or unspecified chronic kidney disease: Secondary | ICD-10-CM | POA: Diagnosis not present

## 2021-05-28 DIAGNOSIS — E871 Hypo-osmolality and hyponatremia: Secondary | ICD-10-CM | POA: Diagnosis not present

## 2021-05-28 DIAGNOSIS — Z794 Long term (current) use of insulin: Secondary | ICD-10-CM | POA: Diagnosis not present

## 2021-05-28 DIAGNOSIS — I25119 Atherosclerotic heart disease of native coronary artery with unspecified angina pectoris: Secondary | ICD-10-CM | POA: Diagnosis not present

## 2021-05-28 DIAGNOSIS — E669 Obesity, unspecified: Secondary | ICD-10-CM | POA: Diagnosis not present

## 2021-05-28 DIAGNOSIS — Z452 Encounter for adjustment and management of vascular access device: Secondary | ICD-10-CM | POA: Diagnosis not present

## 2021-05-28 DIAGNOSIS — I4892 Unspecified atrial flutter: Secondary | ICD-10-CM | POA: Diagnosis not present

## 2021-05-28 DIAGNOSIS — J45909 Unspecified asthma, uncomplicated: Secondary | ICD-10-CM | POA: Diagnosis not present

## 2021-05-28 DIAGNOSIS — Z9981 Dependence on supplemental oxygen: Secondary | ICD-10-CM | POA: Diagnosis not present

## 2021-05-28 DIAGNOSIS — F32A Depression, unspecified: Secondary | ICD-10-CM | POA: Diagnosis not present

## 2021-05-28 DIAGNOSIS — J302 Other seasonal allergic rhinitis: Secondary | ICD-10-CM | POA: Diagnosis not present

## 2021-05-28 DIAGNOSIS — J9601 Acute respiratory failure with hypoxia: Secondary | ICD-10-CM | POA: Diagnosis not present

## 2021-05-28 DIAGNOSIS — E11610 Type 2 diabetes mellitus with diabetic neuropathic arthropathy: Secondary | ICD-10-CM | POA: Diagnosis not present

## 2021-05-28 DIAGNOSIS — T80212D Local infection due to central venous catheter, subsequent encounter: Secondary | ICD-10-CM | POA: Diagnosis not present

## 2021-05-28 DIAGNOSIS — I5084 End stage heart failure: Secondary | ICD-10-CM | POA: Diagnosis not present

## 2021-05-28 DIAGNOSIS — Z6841 Body Mass Index (BMI) 40.0 and over, adult: Secondary | ICD-10-CM | POA: Diagnosis not present

## 2021-05-28 DIAGNOSIS — F419 Anxiety disorder, unspecified: Secondary | ICD-10-CM | POA: Diagnosis not present

## 2021-05-28 DIAGNOSIS — E1142 Type 2 diabetes mellitus with diabetic polyneuropathy: Secondary | ICD-10-CM | POA: Diagnosis not present

## 2021-05-28 DIAGNOSIS — R570 Cardiogenic shock: Secondary | ICD-10-CM

## 2021-05-28 DIAGNOSIS — I517 Cardiomegaly: Secondary | ICD-10-CM | POA: Diagnosis not present

## 2021-05-28 DIAGNOSIS — Z20822 Contact with and (suspected) exposure to covid-19: Secondary | ICD-10-CM | POA: Diagnosis not present

## 2021-05-28 DIAGNOSIS — J9691 Respiratory failure, unspecified with hypoxia: Secondary | ICD-10-CM | POA: Diagnosis not present

## 2021-05-28 DIAGNOSIS — I5023 Acute on chronic systolic (congestive) heart failure: Secondary | ICD-10-CM | POA: Diagnosis not present

## 2021-05-28 DIAGNOSIS — G4733 Obstructive sleep apnea (adult) (pediatric): Secondary | ICD-10-CM | POA: Diagnosis not present

## 2021-05-28 DIAGNOSIS — L8990 Pressure ulcer of unspecified site, unspecified stage: Secondary | ICD-10-CM | POA: Insufficient documentation

## 2021-05-28 DIAGNOSIS — L304 Erythema intertrigo: Secondary | ICD-10-CM | POA: Diagnosis not present

## 2021-05-28 DIAGNOSIS — Z66 Do not resuscitate: Secondary | ICD-10-CM | POA: Diagnosis not present

## 2021-05-28 HISTORY — PX: IABP INSERTION: CATH118242

## 2021-05-28 HISTORY — PX: RIGHT HEART CATH: CATH118263

## 2021-05-28 LAB — POCT I-STAT EG7
Acid-Base Excess: 1 mmol/L (ref 0.0–2.0)
Acid-Base Excess: 2 mmol/L (ref 0.0–2.0)
Bicarbonate: 26.7 mmol/L (ref 20.0–28.0)
Bicarbonate: 27.9 mmol/L (ref 20.0–28.0)
Calcium, Ion: 1.1 mmol/L — ABNORMAL LOW (ref 1.15–1.40)
Calcium, Ion: 1.13 mmol/L — ABNORMAL LOW (ref 1.15–1.40)
HCT: 38 % — ABNORMAL LOW (ref 39.0–52.0)
HCT: 39 % (ref 39.0–52.0)
Hemoglobin: 12.9 g/dL — ABNORMAL LOW (ref 13.0–17.0)
Hemoglobin: 13.3 g/dL (ref 13.0–17.0)
O2 Saturation: 40 %
O2 Saturation: 43 %
Potassium: 4.9 mmol/L (ref 3.5–5.1)
Potassium: 5.2 mmol/L — ABNORMAL HIGH (ref 3.5–5.1)
Sodium: 126 mmol/L — ABNORMAL LOW (ref 135–145)
Sodium: 127 mmol/L — ABNORMAL LOW (ref 135–145)
TCO2: 28 mmol/L (ref 22–32)
TCO2: 29 mmol/L (ref 22–32)
pCO2, Ven: 46.1 mmHg (ref 44–60)
pCO2, Ven: 48 mmHg (ref 44–60)
pH, Ven: 7.37 (ref 7.25–7.43)
pH, Ven: 7.372 (ref 7.25–7.43)
pO2, Ven: 24 mmHg — CL (ref 32–45)
pO2, Ven: 25 mmHg — CL (ref 32–45)

## 2021-05-28 LAB — CBC
HCT: 33.2 % — ABNORMAL LOW (ref 39.0–52.0)
HCT: 33.6 % — ABNORMAL LOW (ref 39.0–52.0)
Hemoglobin: 10.7 g/dL — ABNORMAL LOW (ref 13.0–17.0)
Hemoglobin: 10.9 g/dL — ABNORMAL LOW (ref 13.0–17.0)
MCH: 29.7 pg (ref 26.0–34.0)
MCH: 29.9 pg (ref 26.0–34.0)
MCHC: 32.2 g/dL (ref 30.0–36.0)
MCHC: 32.4 g/dL (ref 30.0–36.0)
MCV: 92.2 fL (ref 80.0–100.0)
MCV: 92.1 fL (ref 80.0–100.0)
Platelets: 256 10*3/uL (ref 150–400)
Platelets: 246 10*3/uL (ref 150–400)
RBC: 3.6 MIL/uL — ABNORMAL LOW (ref 4.22–5.81)
RBC: 3.65 MIL/uL — ABNORMAL LOW (ref 4.22–5.81)
RDW: 16.6 % — ABNORMAL HIGH (ref 11.5–15.5)
RDW: 16.6 % — ABNORMAL HIGH (ref 11.5–15.5)
WBC: 8.4 10*3/uL (ref 4.0–10.5)
WBC: 8.7 10*3/uL (ref 4.0–10.5)
nRBC: 0 % (ref 0.0–0.2)
nRBC: 0 % (ref 0.0–0.2)

## 2021-05-28 LAB — COOXEMETRY PANEL
Carboxyhemoglobin: 0.8 % (ref 0.5–1.5)
Carboxyhemoglobin: 1 % (ref 0.5–1.5)
Carboxyhemoglobin: 1.4 % (ref 0.5–1.5)
Carboxyhemoglobin: 0.8 % (ref 0.5–1.5)
Carboxyhemoglobin: 1.3 % (ref 0.5–1.5)
Methemoglobin: <0.7 % (ref 0.0–1.5)
Methemoglobin: <0.7 % (ref 0.0–1.5)
Methemoglobin: <0.7 % (ref 0.0–1.5)
Methemoglobin: <0.7 % (ref 0.0–1.5)
Methemoglobin: <0.7 % (ref 0.0–1.5)
O2 Saturation: 35.4 %
O2 Saturation: 33.7 %
O2 Saturation: 43.1 %
O2 Saturation: 40.4 %
O2 Saturation: 49.9 %
Total hemoglobin: 11 g/dL — ABNORMAL LOW (ref 12.0–16.0)
Total hemoglobin: 10.9 g/dL — ABNORMAL LOW (ref 12.0–16.0)
Total hemoglobin: 10.9 g/dL — ABNORMAL LOW (ref 12.0–16.0)
Total hemoglobin: 10.9 g/dL — ABNORMAL LOW (ref 12.0–16.0)
Total hemoglobin: 11 g/dL — ABNORMAL LOW (ref 12.0–16.0)

## 2021-05-28 LAB — POCT I-STAT 7, (LYTES, BLD GAS, ICA,H+H)
Acid-Base Excess: 0 mmol/L (ref 0.0–2.0)
Bicarbonate: 24.2 mmol/L (ref 20.0–28.0)
Calcium, Ion: 1.03 mmol/L — ABNORMAL LOW (ref 1.15–1.40)
HCT: 38 % — ABNORMAL LOW (ref 39.0–52.0)
Hemoglobin: 12.9 g/dL — ABNORMAL LOW (ref 13.0–17.0)
O2 Saturation: 96 %
Potassium: 5.1 mmol/L (ref 3.5–5.1)
Sodium: 125 mmol/L — ABNORMAL LOW (ref 135–145)
TCO2: 25 mmol/L (ref 22–32)
pCO2 arterial: 38 mmHg (ref 32–48)
pH, Arterial: 7.412 (ref 7.35–7.45)
pO2, Arterial: 81 mmHg — ABNORMAL LOW (ref 83–108)

## 2021-05-28 LAB — BASIC METABOLIC PANEL
Anion gap: 9 (ref 5–15)
Anion gap: 12 (ref 5–15)
BUN: 51 mg/dL — ABNORMAL HIGH (ref 6–20)
BUN: 60 mg/dL — ABNORMAL HIGH (ref 6–20)
CO2: 26 mmol/L (ref 22–32)
CO2: 25 mmol/L (ref 22–32)
Calcium: 8.5 mg/dL — ABNORMAL LOW (ref 8.9–10.3)
Calcium: 8.7 mg/dL — ABNORMAL LOW (ref 8.9–10.3)
Chloride: 92 mmol/L — ABNORMAL LOW (ref 98–111)
Chloride: 87 mmol/L — ABNORMAL LOW (ref 98–111)
Creatinine, Ser: 1.82 mg/dL — ABNORMAL HIGH (ref 0.61–1.24)
Creatinine, Ser: 2.44 mg/dL — ABNORMAL HIGH (ref 0.61–1.24)
GFR, Estimated: 49 mL/min — ABNORMAL LOW (ref >60)
GFR, Estimated: 34 mL/min — ABNORMAL LOW (ref >60)
Glucose, Bld: 208 mg/dL — ABNORMAL HIGH (ref 70–99)
Glucose, Bld: 203 mg/dL — ABNORMAL HIGH (ref 70–99)
Potassium: 4.4 mmol/L (ref 3.5–5.1)
Potassium: 5 mmol/L (ref 3.5–5.1)
Sodium: 127 mmol/L — ABNORMAL LOW (ref 135–145)
Sodium: 124 mmol/L — ABNORMAL LOW (ref 135–145)

## 2021-05-28 LAB — GLUCOSE, CAPILLARY
Glucose-Capillary: 209 mg/dL — ABNORMAL HIGH (ref 70–99)
Glucose-Capillary: 215 mg/dL — ABNORMAL HIGH (ref 70–99)
Glucose-Capillary: 200 mg/dL — ABNORMAL HIGH (ref 70–99)
Glucose-Capillary: 239 mg/dL — ABNORMAL HIGH (ref 70–99)
Glucose-Capillary: 182 mg/dL — ABNORMAL HIGH (ref 70–99)
Glucose-Capillary: 197 mg/dL — ABNORMAL HIGH (ref 70–99)

## 2021-05-28 LAB — MAGNESIUM
Magnesium: 2.2 mg/dL (ref 1.7–2.4)
Magnesium: 2.2 mg/dL (ref 1.7–2.4)

## 2021-05-28 LAB — HEMOGLOBIN A1C
Hgb A1c MFr Bld: 8.8 % — ABNORMAL HIGH (ref 4.8–5.6)
Mean Plasma Glucose: 205.86 mg/dL

## 2021-05-28 LAB — LACTIC ACID, PLASMA
Lactic Acid, Venous: 2.2 mmol/L (ref 0.5–1.9)
Lactic Acid, Venous: 1.5 mmol/L (ref 0.5–1.9)

## 2021-05-28 LAB — TROPONIN I (HIGH SENSITIVITY): Troponin I (High Sensitivity): 103 ng/L (ref <18)

## 2021-05-28 SURGERY — RIGHT HEART CATH
Anesthesia: LOCAL

## 2021-05-28 MED ORDER — HEPARIN (PORCINE) 25000 UT/250ML-% IV SOLN: 1100.0000 [IU]/h | INTRAVENOUS | Status: DC

## 2021-05-28 MED ORDER — NOREPINEPHRINE 4 MG/250ML-% IV SOLN: 3.0000 ug/kg/min | INTRAVENOUS | Status: DC

## 2021-05-28 MED ORDER — HEPARIN SODIUM (PORCINE) 1000 UNIT/ML IJ SOLN
INTRAMUSCULAR | Status: AC
  Filled 2021-05-28: qty 10

## 2021-05-28 MED ORDER — FENTANYL CITRATE (PF) 100 MCG/2ML IJ SOLN
INTRAMUSCULAR | Status: DC | PRN
  Administered 2021-05-28 (×2): 25 ug via INTRAVENOUS

## 2021-05-28 MED ORDER — LIDOCAINE HCL (PF) 1 % IJ SOLN
INTRAMUSCULAR | Status: DC | PRN
  Administered 2021-05-28: 10 mL
  Administered 2021-05-28: 15 mL

## 2021-05-28 MED ORDER — AMIODARONE IV BOLUS ONLY 150 MG/100ML
150.0000 mg | Freq: Once | INTRAVENOUS | Status: AC
  Administered 2021-05-28: 150 mg via INTRAVENOUS

## 2021-05-28 MED ORDER — ONDANSETRON 4 MG PO TBDP
4.0000 mg | ORAL_TABLET | Freq: Three times a day (TID) | ORAL | Status: DC | PRN
  Administered 2021-05-30: 4 mg via ORAL
  Filled 2021-05-28 (×2): qty 1

## 2021-05-28 MED ORDER — SODIUM CHLORIDE 0.9 % IV SOLN: INTRAVENOUS | Status: DC

## 2021-05-28 MED ORDER — AMIODARONE HCL IN DEXTROSE 360-4.14 MG/200ML-% IV SOLN
INTRAVENOUS | Status: AC
Start: 2021-05-28 — End: 2021-05-28
  Filled 2021-05-28: qty 200

## 2021-05-28 MED ORDER — HEPARIN SODIUM (PORCINE) 1000 UNIT/ML IJ SOLN
INTRAMUSCULAR | Status: DC | PRN
  Administered 2021-05-28: 5000 [IU] via INTRAVENOUS

## 2021-05-28 MED ORDER — IOHEXOL 350 MG/ML SOLN
INTRAVENOUS | Status: DC | PRN
  Administered 2021-05-28: 5 mL

## 2021-05-28 MED ORDER — METOLAZONE 2.5 MG PO TABS
2.5000 mg | ORAL_TABLET | Freq: Once | ORAL | Status: AC
  Administered 2021-05-28: 2.5 mg via ORAL
  Filled 2021-05-28: qty 1

## 2021-05-28 MED ORDER — NOREPINEPHRINE 4 MG/250ML-% IV SOLN
INTRAVENOUS | Status: AC
  Filled 2021-05-28: qty 250

## 2021-05-28 MED ORDER — NOREPINEPHRINE 4 MG/250ML-% IV SOLN
5.0000 ug/min | INTRAVENOUS | Status: DC
  Administered 2021-05-28: 3 ug/min via INTRAVENOUS
  Administered 2021-05-29: 15 ug/min via INTRAVENOUS
  Administered 2021-05-29: 13 ug/min via INTRAVENOUS
  Filled 2021-05-28 (×2): qty 250

## 2021-05-28 MED ORDER — LIDOCAINE HCL (PF) 1 % IJ SOLN
INTRAMUSCULAR | Status: AC
  Filled 2021-05-28: qty 30

## 2021-05-28 MED ORDER — AMIODARONE HCL IN DEXTROSE 360-4.14 MG/200ML-% IV SOLN
60.0000 mg/h | INTRAVENOUS | Status: AC
Start: 2021-05-28 — End: 2021-05-28
  Administered 2021-05-28: 60 mg/h via INTRAVENOUS
  Filled 2021-05-28: qty 200

## 2021-05-28 MED ORDER — AMIODARONE HCL IN DEXTROSE 360-4.14 MG/200ML-% IV SOLN
30.0000 mg/h | INTRAVENOUS | Status: DC
Start: 2021-05-28 — End: 2021-06-04
  Administered 2021-05-28 – 2021-06-03 (×12): 30 mg/h via INTRAVENOUS
  Filled 2021-05-28 (×12): qty 200

## 2021-05-28 MED ORDER — FENTANYL CITRATE (PF) 100 MCG/2ML IJ SOLN
INTRAMUSCULAR | Status: AC
  Filled 2021-05-28: qty 2

## 2021-05-28 MED ORDER — SODIUM CHLORIDE 0.9% IV SOLUTION: INTRAVENOUS | Status: DC | PRN

## 2021-05-28 MED ORDER — HEPARIN (PORCINE) IN NACL 1000-0.9 UT/500ML-% IV SOLN
INTRAVENOUS | Status: AC
  Filled 2021-05-28: qty 1000

## 2021-05-28 MED ORDER — HEPARIN (PORCINE) 25000 UT/250ML-% IV SOLN
2000.0000 [IU]/h | INTRAVENOUS | Status: DC
  Administered 2021-05-28: 1100 [IU]/h via INTRAVENOUS
  Administered 2021-05-29: 1750 [IU]/h via INTRAVENOUS
  Administered 2021-05-30: 2150 [IU]/h via INTRAVENOUS
  Administered 2021-05-31 – 2021-06-03 (×5): 2200 [IU]/h via INTRAVENOUS
  Filled 2021-05-28 (×10): qty 250

## 2021-05-28 MED ORDER — SODIUM CHLORIDE 0.9% IV SOLUTION: INTRAVENOUS | Status: DC

## 2021-05-28 SURGICAL SUPPLY — 14 items
BALLN IABP SENSA PLUS 8F 50CC (BALLOONS) ×2
BALLOON IABP SENS PLUS 8F 50CC (BALLOONS) IMPLANT
CATH SWAN GANZ VIP 7.5F (CATHETERS) ×1 IMPLANT
ELECT DEFIB PAD ADLT CADENCE (PAD) ×1 IMPLANT
KIT ESSENTIALS PG (KITS) ×2 IMPLANT
KIT HEART LEFT (KITS) ×2 IMPLANT
MAT PREVALON FULL STRYKER (MISCELLANEOUS) ×1 IMPLANT
PACK CARDIAC CATHETERIZATION (CUSTOM PROCEDURE TRAY) ×2 IMPLANT
SHEATH PINNACLE 5F 10CM (SHEATH) ×1 IMPLANT
SHEATH PINNACLE 8F 10CM (SHEATH) ×1 IMPLANT
SHEATH PROBE COVER 6X72 (BAG) ×1 IMPLANT
SLEEVE REPOSITIONING LENGTH 30 (MISCELLANEOUS) ×1 IMPLANT
TRANSDUCER W/STOPCOCK (MISCELLANEOUS) ×2 IMPLANT
WIRE EMERALD 3MM-J .035X150CM (WIRE) ×1 IMPLANT

## 2021-05-28 NOTE — Consult Note (Signed)
Urology Consult   Physician requesting consult: Dr Gala Romney  Reason for consult: Foley catheter placement  History of Present Illness: Jonathan [name] is a 36 y.o. African-American male with history of just of heart failure.  Patient has been in cardiogenic shock and underwent recent balloon pump.  Post procedurally is requiring diuresis and nurses unable to place Foley due to tight foreskin.  Patient is uncircumcised.  Asked to assist in Foley placement.  Patient's foreskin with tight phimosis unable to visualize meatus.  Cannot retract foreskin.  I utilized male sounds to dilate the penile foreskin.  I was then able to pass a 14 [place] Foley under direct feel into the urethral meatus and advanced to the bladder without difficulty.  Return of clear urine noted.  He denies a history of voiding or storage urinary symptoms, hematuria, UTIs, STDs, urolithiasis, GU malignancy/trauma/surgery.  Past Medical History:  Diagnosis Date   Acute exacerbation of CHF (congestive heart failure) (HCC) 04/05/2021   Acute on chronic heart failure (HCC) 10/28/2020   Acute pain of left foot 05/03/2017   Asthma    Asthma, chronic, mild persistent, uncomplicated 10/26/2016   Charcot ankle, left 05/31/2017   CHF (congestive heart failure) (HCC)    Chronic systolic (congestive) heart failure (HCC)    Diabetes mellitus (HCC)    Diabetes mellitus type 2 in obese (HCC) 04/21/2014   Diabetic polyneuropathy associated with type 2 diabetes mellitus (HCC) 03/14/2017   Essential hypertension 10/26/2016   Hypertension    Obesity    Obesity, Class III, BMI 40-49.9 (morbid obesity) (HCC) 10/26/2016   OSA (obstructive sleep apnea) 12/02/2016   Type 2 diabetes mellitus with diabetic neuropathy (HCC)     Past Surgical History:  Procedure Laterality Date   CARDIAC CATHETERIZATION     FOOT SURGERY     arch surgery   RIGHT/LEFT HEART CATH AND CORONARY ANGIOGRAPHY N/A 04/10/2021   Procedure: RIGHT/LEFT HEART CATH AND  CORONARY ANGIOGRAPHY;  Surgeon: Dolores Patty, MD;  Location: [facility] IN[facility]ASIVE [facility];  Service: Cardiovascular;  Laterality: N/A;   TEE WITHOUT CARDIOVERSION N/A 04/09/2021   Procedure: TRANSESOPHAGEAL ECHOCARDIOGRAM (TEE);  Surgeon: Dolores Patty, MD;  Location: [facility] ENDOSCOPY;  Service: Cardiovascular;  Laterality: N/A;    Current Hospital Medications:  Home Meds:  No current facility-administered medications on file prior to encounter.   Current Outpatient Medications on File Prior to Encounter  Medication Sig Dispense Refill   albuterol (VENTOLIN HFA) 108 (90 Base) MCG/ACT inhaler Inhale 2 puffs into the lungs every 4 (four) hours as needed for wheezing or shortness of breath. 18 g 0   amiodarone (PACERONE) 200 MG tablet TAKE 1/2 TABLET BY MOUTH DAILY (Patient taking differently: Take 100 mg by mouth daily.) 7 tablet 6   apixaban (ELIQUIS) 5 MG TABS tablet Take 1 tablet (5 mg total) by mouth 2 (two) times daily. 60 tablet 2   azelastine (ASTELIN) 0.1 % nasal spray Place into both nostrils as needed for rhinitis. Use in each nostril as directed     BIDIL 20-37.5 MG tablet TAKE 1 TABLET BY MOUTH THREE TIMES A DAY (Patient taking differently: Take 1 tablet by mouth 3 (three) times daily.) 45 tablet 5   [employer] SENNA 8.6 MG tablet Take 1 tablet by mouth daily as needed for constipation.     digoxin (LANOXIN) 0.125 MG tablet TAKE 1 TABLET BY MOUTH EVERY DAY (Patient taking differently: Take 0.125 mg by mouth daily.) 90 tablet 3   Dulaglutide (TRULICITY) 1.5 MG/0.5ML SOPN Inject  1 mg into the skin once a week. Patient takes every Monday.     ENTRESTO 24-26 MG TAKE 1 TABLET BY MOUTH TWICE A DAY 60 tablet 3   gabapentin (NEURONTIN) 300 MG capsule Take 300 mg by mouth as needed (pain).     hyoscyamine (LEVSIN SL) 0.125 MG SL tablet Place 0.125 mg under the tongue every 4 (four) hours as needed for cramping (secretions).     insulin aspart (NOVOLOG) 100 UNIT/ML injection Inject 20 Units into the  skin 3 (three) times daily with meals. 20 mL 11   insulin glargine (LANTUS) 100 UNIT/ML injection Inject 0.3 mLs (30 Units total) into the skin 2 (two) times daily. 20 mL 11   loratadine (CLARITIN) 10 MG tablet Take 1 tablet (10 mg total) by mouth 2 (two) times daily as needed for allergies (Can use an extra dose during flare ups.). 60 tablet 5   metolazone (ZAROXOLYN) 2.5 MG tablet Take 1 tablet (2.5 mg total) by mouth once a week. On Thursdays 5 tablet 3   nitroGLYCERIN (NITROSTAT) 0.3 MG SL tablet Place 0.3 mg under the tongue every 5 (five) minutes as needed for chest pain.     ondansetron (ZOFRAN) 4 MG tablet Take 4 mg by mouth daily as needed for nausea or vomiting.     pantoprazole (PROTONIX) 40 MG tablet Take 1 tablet (40 mg total) by mouth daily. 30 tablet 1   potassium chloride SA (KLOR-CON M) 20 MEQ tablet Take 2 tablets (40 mEq total) by mouth 2 (two) times daily. Take extra 40 meq (2 tabs) every Thursday when you take Metolazone 130 tablet 3   spironolactone (ALDACTONE) 25 MG tablet Take 1 tablet (25 mg total) by mouth daily. 30 tablet 0   torsemide (DEMADEX) 20 MG tablet Take 4 tablets (80 mg total) by mouth 2 (two) times daily. 240 tablet 0   traMADol (ULTRAM) 50 MG tablet TAKE 1 TABLET BY MOUTH EVERY 6 HOURS AS NEEDED. (Patient taking differently: Take 50 mg by mouth every 6 (six) hours as needed for moderate pain.) 20 tablet 0   Accu-Chek Softclix Lancets lancets Check blood sugar 3x a day 100 each 11   blood glucose meter kit and supplies KIT Dispense based on patient and insurance preference. Use up to four times daily as directed. (FOR ICD-9 250.00, 250.01). For QAC - HS accuchecks. May switch to any brand. 1 each 1   Continuous Blood Gluc Sensor (FREESTYLE LIBRE 2 SENSOR) MISC Use to check blood sugar at least 6 times a day 2 each 11   glucose blood (ACCU-CHEK GUIDE) test strip Check blood sugar 3 times per day 100 each 11   Insulin Syringe-Needle U-100 30G X 1/2" 1 ML MISC Use to  inject insulin 4 times a day. 100 each 2   tirzepatide (MOUNJARO) 2.5 MG/0.5ML Pen INJECT 2.5 MG INTO THE SKIN ONCE A WEEK FOR 28 DAYS, THEN 5 MG ONCE A WEEK. 2 mL 1     Scheduled Meds:  Chlorhexidine Gluconate Cloth  6 each Topical Daily   digoxin  0.125 mg Oral Daily   insulin aspart  0-20 Units Subcutaneous TID WC   insulin aspart  0-5 Units Subcutaneous QHS   pantoprazole  40 mg Oral Daily   potassium chloride SA  40 mEq Oral BID   sodium chloride flush  3 mL Intravenous Q12H   Continuous Infusions:  sodium chloride     sodium chloride     sodium chloride  sodium chloride     amiodarone 30 mg/hr (05/28/21 2207)   furosemide (LASIX) 200 mg in dextrose 5% 100 mL (2mg /mL) infusion 20 mg/hr (05/28/21 2133)   heparin 1,100 Units/hr (05/28/21 2124)   milrinone 0.5 mcg/kg/min (05/28/21 2215)   norepinephrine (LEVOPHED) Adult infusion 5 mcg/min (05/28/21 2132)   PRN Meds:.sodium chloride, sodium chloride, acetaminophen, albuterol, ALPRAZolam, alum & mag hydroxide-simeth, azelastine, gabapentin, hyoscyamine, loratadine, nitroGLYCERIN, ondansetron, senna, simethicone, sodium chloride flush, traMADol  Allergies:  Allergies  Allergen Reactions   Hydrocodone     Hives     Family History  Problem Relation Age of Onset   Diabetes Mellitus II Mother    Heart failure Mother    Hypertension Mother    Heart disease Mother    Heart failure Father    Kidney disease Father    Heart disease Father    Congestive Heart Failure Neg Hx     Social History:  reports that he has never smoked. He has never used smokeless tobacco. He reports that he does not drink alcohol and does not use drugs.  ROS: A complete review of systems was performed.  All systems are negative except for pertinent findings as noted.  Physical Exam:  Vital signs in last 24 hours: Temp:  [97.5 F (36.4 C)-97.9 F (36.6 C)] 97.5 F (36.4 C) (04/13 2200) Pulse Rate:  [98-128] 99 (04/13 2200) Resp:  [16-28] 27  (04/13 2200) BP: (78-138)/(52-110) 101/89 (04/13 2200) SpO2:  [86 %-100 %] 99 % (04/13 2200) Weight:  [171.6 kg] 171.6 kg (04/13 0313) Constitutional:  Alert and oriented, No acute distress GU: Tight phimotic foreskin unable to retract.  The glans is palpable beneath the foreskin. Laboratory Data:  Recent Labs    05/26/21 1918 05/28/21 0953 05/28/21 1855 05/28/21 1916 05/28/21 2243  WBC 8.1 8.4  --   --  8.7  HGB 10.9* 10.7* 13.3  12.9* 12.9* 10.9*  HCT 35.4* 33.2* 39.0  38.0* 38.0* 33.6*  PLT 238 256  --   --  246    Recent Labs    05/26/21 1813 05/27/21 1233 05/28/21 0319 05/28/21 1855 05/28/21 1916  NA 131* 130* 127* 126*  127* 125*  K 4.1 4.1 4.4 5.2*  4.9 5.1  CL 92* 91* 92*  --   --   GLUCOSE 168* 201* 208*  --   --   BUN 51* 52* 51*  --   --   CALCIUM 9.7 9.1 8.5*  --   --   CREATININE 1.53* 1.67* 1.82*  --   --      Results for orders placed or performed during the hospital encounter of 05/26/21 (from the past 24 hour(s))  .Cooxemetry Panel (carboxy, met, total hgb, O2 sat)     Status: Abnormal   Collection Time: 05/28/21  3:19 AM  Result Value Ref Range   Total hemoglobin 11.0 (L) 12.0 - 16.0 g/dL   O2 Saturation 40.9 %   Carboxyhemoglobin 0.8 0.5 - 1.5 %   Methemoglobin <0.7 0.0 - 1.5 %  Basic metabolic panel     Status: Abnormal   Collection Time: 05/28/21  3:19 AM  Result Value Ref Range   Sodium 127 (L) 135 - 145 mmol/L   Potassium 4.4 3.5 - 5.1 mmol/L   Chloride 92 (L) 98 - 111 mmol/L   CO2 26 22 - 32 mmol/L   Glucose, Bld 208 (H) 70 - 99 mg/dL   BUN 51 (H) 6 - 20 mg/dL   Creatinine, Ser  1.82 (H) 0.61 - 1.24 mg/dL   Calcium 8.5 (L) 8.9 - 10.3 mg/dL   GFR, Estimated 49 (L) >60 mL/min   Anion gap 9 5 - 15  Magnesium     Status: None   Collection Time: 05/28/21  3:19 AM  Result Value Ref Range   Magnesium 2.2 1.7 - 2.4 mg/dL  .Cooxemetry Panel (carboxy, met, total hgb, O2 sat)     Status: Abnormal   Collection Time: 05/28/21  4:40 AM   Result Value Ref Range   Total hemoglobin 10.9 (L) 12.0 - 16.0 g/dL   O2 Saturation 16.1 %   Carboxyhemoglobin 1.0 0.5 - 1.5 %   Methemoglobin <0.7 0.0 - 1.5 %  Glucose, capillary     Status: Abnormal   Collection Time: 05/28/21  6:09 AM  Result Value Ref Range   Glucose-Capillary 209 (H) 70 - 99 mg/dL  Glucose, capillary     Status: Abnormal   Collection Time: 05/28/21  7:52 AM  Result Value Ref Range   Glucose-Capillary 215 (H) 70 - 99 mg/dL  Lactic acid, plasma     Status: Abnormal   Collection Time: 05/28/21  8:08 AM  Result Value Ref Range   Lactic Acid, Venous 2.2 (HH) 0.5 - 1.9 mmol/L  CBC     Status: Abnormal   Collection Time: 05/28/21  9:53 AM  Result Value Ref Range   WBC 8.4 4.0 - 10.5 K/uL   RBC 3.60 (L) 4.22 - 5.81 MIL/uL   Hemoglobin 10.7 (L) 13.0 - 17.0 g/dL   HCT 09.6 (L) 04.5 - 40.9 %   MCV 92.2 80.0 - 100.0 fL   MCH 29.7 26.0 - 34.0 pg   MCHC 32.2 30.0 - 36.0 g/dL   RDW 81.1 (H) 91.4 - 78.2 %   Platelets 256 150 - 400 K/uL   nRBC 0.0 0.0 - 0.2 %  Hemoglobin A1c     Status: Abnormal   Collection Time: 05/28/21  9:53 AM  Result Value Ref Range   Hgb A1c MFr Bld 8.8 (H) 4.8 - 5.6 %   Mean Plasma Glucose 205.86 mg/dL  Magnesium     Status: None   Collection Time: 05/28/21  9:53 AM  Result Value Ref Range   Magnesium 2.2 1.7 - 2.4 mg/dL  Troponin I (High Sensitivity)     Status: Abnormal   Collection Time: 05/28/21  9:53 AM  Result Value Ref Range   Troponin I (High Sensitivity) 103 (HH) <18 ng/L  Glucose, capillary     Status: Abnormal   Collection Time: 05/28/21 12:04 PM  Result Value Ref Range   Glucose-Capillary 200 (H) 70 - 99 mg/dL  Cooxemetry Panel (carboxy, met, total hgb, O2 sat)     Status: Abnormal   Collection Time: 05/28/21  1:59 PM  Result Value Ref Range   Total hemoglobin 10.9 (L) 12.0 - 16.0 g/dL   O2 Saturation 95.6 %   Carboxyhemoglobin 1.4 0.5 - 1.5 %   Methemoglobin <0.7 0.0 - 1.5 %  Glucose, capillary     Status: Abnormal    Collection Time: 05/28/21  4:05 PM  Result Value Ref Range   Glucose-Capillary 239 (H) 70 - 99 mg/dL  .Cooxemetry Panel (carboxy, met, total hgb, O2 sat)     Status: Abnormal   Collection Time: 05/28/21  4:59 PM  Result Value Ref Range   Total hemoglobin 10.9 (L) 12.0 - 16.0 g/dL   O2 Saturation 21.3 %   Carboxyhemoglobin 0.8 0.5 - 1.5 %  Methemoglobin <0.7 0.0 - 1.5 %  POCT I-Stat EG7     Status: Abnormal   Collection Time: 05/28/21  6:55 PM  Result Value Ref Range   pH, Ven 7.370 7.25 - 7.43   pCO2, Ven 46.1 44 - 60 mmHg   pO2, Ven 25 (LL) 32 - 45 mmHg   Bicarbonate 26.7 20.0 - 28.0 mmol/L   TCO2 28 22 - 32 mmol/L   O2 Saturation 43 %   Acid-Base Excess 1.0 0.0 - 2.0 mmol/L   Sodium 127 (L) 135 - 145 mmol/L   Potassium 4.9 3.5 - 5.1 mmol/L   Calcium, Ion 1.10 (L) 1.15 - 1.40 mmol/L   HCT 38.0 (L) 39.0 - 52.0 %   Hemoglobin 12.9 (L) 13.0 - 17.0 g/dL   Sample type MIXED VENOUS SAMPLE    Comment NOTIFIED PHYSICIAN   POCT I-Stat EG7     Status: Abnormal   Collection Time: 05/28/21  6:55 PM  Result Value Ref Range   pH, Ven 7.372 7.25 - 7.43   pCO2, Ven 48.0 44 - 60 mmHg   pO2, Ven 24 (LL) 32 - 45 mmHg   Bicarbonate 27.9 20.0 - 28.0 mmol/L   TCO2 29 22 - 32 mmol/L   O2 Saturation 40 %   Acid-Base Excess 2.0 0.0 - 2.0 mmol/L   Sodium 126 (L) 135 - 145 mmol/L   Potassium 5.2 (H) 3.5 - 5.1 mmol/L   Calcium, Ion 1.13 (L) 1.15 - 1.40 mmol/L   HCT 39.0 39.0 - 52.0 %   Hemoglobin 13.3 13.0 - 17.0 g/dL   Sample type MIXED VENOUS SAMPLE    Comment NOTIFIED PHYSICIAN   I-STAT 7, (LYTES, BLD GAS, ICA, H+H)     Status: Abnormal   Collection Time: 05/28/21  7:16 PM  Result Value Ref Range   pH, Arterial 7.412 7.35 - 7.45   pCO2 arterial 38.0 32 - 48 mmHg   pO2, Arterial 81 (L) 83 - 108 mmHg   Bicarbonate 24.2 20.0 - 28.0 mmol/L   TCO2 25 22 - 32 mmol/L   O2 Saturation 96 %   Acid-Base Excess 0.0 0.0 - 2.0 mmol/L   Sodium 125 (L) 135 - 145 mmol/L   Potassium 5.1 3.5 - 5.1  mmol/L   Calcium, Ion 1.03 (L) 1.15 - 1.40 mmol/L   HCT 38.0 (L) 39.0 - 52.0 %   Hemoglobin 12.9 (L) 13.0 - 17.0 g/dL   Sample type ARTERIAL   Glucose, capillary     Status: Abnormal   Collection Time: 05/28/21  8:10 PM  Result Value Ref Range   Glucose-Capillary 182 (H) 70 - 99 mg/dL  .Cooxemetry Panel (carboxy, met, total hgb, O2 sat)     Status: Abnormal   Collection Time: 05/28/21 10:43 PM  Result Value Ref Range   Total hemoglobin 11.0 (L) 12.0 - 16.0 g/dL   O2 Saturation 16.1 %   Carboxyhemoglobin 1.3 0.5 - 1.5 %   Methemoglobin <0.7 0.0 - 1.5 %  CBC     Status: Abnormal   Collection Time: 05/28/21 10:43 PM  Result Value Ref Range   WBC 8.7 4.0 - 10.5 K/uL   RBC 3.65 (L) 4.22 - 5.81 MIL/uL   Hemoglobin 10.9 (L) 13.0 - 17.0 g/dL   HCT 09.6 (L) 04.5 - 40.9 %   MCV 92.1 80.0 - 100.0 fL   MCH 29.9 26.0 - 34.0 pg   MCHC 32.4 30.0 - 36.0 g/dL   RDW 81.1 (H) 91.4 - 78.2 %  Platelets 246 150 - 400 K/uL   nRBC 0.0 0.0 - 0.2 %   Recent Results (from the past 240 hour(s))  Clostridium difficile Toxin A/B     Status: None   Collection Time: 05/21/21  8:50 AM   Specimen: Stool   Stool  Result Value Ref Range Status   C difficile Toxins A+B, EIA Negative Negative Final  MRSA Next Gen by PCR, Nasal     Status: None   Collection Time: 05/27/21  4:02 PM   Specimen: Nasal Mucosa; Nasal Swab  Result Value Ref Range Status   MRSA by PCR Next Gen NOT DETECTED NOT DETECTED Final    Comment: (NOTE) The GeneXpert MRSA Assay (FDA approved for NASAL specimens only), is one component of a comprehensive MRSA colonization surveillance program. It is not intended to diagnose MRSA infection nor to guide or monitor treatment for MRSA infections. Test performance is not FDA approved in patients less than 28 years old. Performed at [facility], [address], [place], [place] [address]     Renal Function: Recent Labs    05/26/21 1813 05/27/21 1233 05/28/21 0319  CREATININE  1.53* 1.67* 1.82*   Estimated Creatinine Clearance: 93.7 mL/min (A) (by C-G formula based on SCr of 1.82 mg/dL (H)).  Radiologic Imaging: CARDIAC CATHETERIZATION  Result Date: 05/28/2021 Findings: On NE 5 and milrinone 0.5 RA = 37 RV = 47/38 PA = 51/47 (49) PCW = 32 Fick cardiac output/index = 3.7/1.3 Themo CO/CI = 4.1/1.4 PVR = 3.5 WU FA sat = 99% PA sat = 40%. 43% Assessment: 1. Severe biventricular HF with cardiogenic shock Plan/Discussion: Continue hemodynamic support. Plan DC/CV of Afl when more stable. Arvilla Meres, MD 7:48 PM  Port CXR  Result Date: 05/28/2021 CLINICAL DATA:  Check central line placement EXAM: PORTABLE CHEST 1 VIEW COMPARISON:  05/26/2021 FINDINGS: Cardiac shadow is enlarged but stable. Intra-aortic balloon pump is noted just below the aortic knob. Swan-Ganz catheter is noted in the right pulmonary artery. Right-sided PICC is noted extending into the proximal superior vena cava. No focal infiltrate or effusion is seen. No bony abnormality is noted. IMPRESSION: Tubes and lines as described. No acute abnormality noted. Electronically Signed   By: Alcide Clever M.D.   On: 05/28/2021 21:22   ECHOCARDIOGRAM COMPLETE  Result Date: 05/27/2021    ECHOCARDIOGRAM REPORT   Patient Name:   TAMER GREIM Date of Exam: 05/27/2021 Medical Rec #:  [id number]     Height:       74.0 in Accession #:    [id number]    Weight:       362.8 lb Date of Birth:  12/29/1985      BSA:          2.799 m Patient Age:    36 years      BP:           112/75 mmHg Patient Gender: M             HR:           118 bpm. Exam Location:  Inpatient Procedure: Cardiac Doppler, Color Doppler, 2D Echo and Intracardiac            Opacification Agent Indications:    CHF  History:        Patient has prior history of Echocardiogram examinations. CHF;                 Risk Factors:Hypertension, Diabetes and Sleep Apnea.  Sonographer:  Cleatis Polka Referring Phys: [id number] FAN YE  Sonographer Comments: Patient is morbidly obese.  Image acquisition challenging due to patient body habitus. IMPRESSIONS  1. Left ventricular ejection fraction, by estimation, is <20%. The left ventricle has severely decreased function. The left ventricle demonstrates global hypokinesis. The left ventricular internal cavity size was mildly dilated. Left ventricular diastolic function could not be evaluated. Elevated left ventricular end-diastolic pressure.  2. Right ventricular systolic function is severely reduced. The right ventricular size is moderately enlarged. There is mildly elevated pulmonary artery systolic pressure. The estimated right ventricular systolic pressure is 41.8 mmHg.  3. The mitral valve is normal in structure. Mild mitral valve regurgitation. No evidence of mitral stenosis.  4. Tricuspid valve regurgitation is mild to moderate.  5. The aortic valve is normal in structure. Aortic valve regurgitation is not visualized. No aortic stenosis is present.  6. The inferior vena cava is normal in size with greater than 50% respiratory variability, suggesting right atrial pressure of 3 mmHg. FINDINGS  Left Ventricle: Left ventricular ejection fraction, by estimation, is <20%. The left ventricle has severely decreased function. The left ventricle demonstrates global hypokinesis. Definity contrast agent was given IV to delineate the left ventricular endocardial borders. The left ventricular internal cavity size was mildly dilated. There is no left ventricular hypertrophy. Abnormal (paradoxical) septal motion, consistent with left bundle branch block. Left ventricular diastolic function could not be evaluated. Elevated left ventricular end-diastolic pressure. Right Ventricle: The right ventricular size is moderately enlarged. No increase in right ventricular wall thickness. Right ventricular systolic function is severely reduced. There is mildly elevated pulmonary artery systolic pressure. The tricuspid regurgitant velocity is 2.59 m/s, and with an assumed  right atrial pressure of 15 mmHg, the estimated right ventricular systolic pressure is 41.8 mmHg. Left Atrium: Left atrial size was normal in size. Right Atrium: Right atrial size was normal in size. Pericardium: There is no evidence of pericardial effusion. Mitral Valve: The mitral valve is normal in structure. Mild mitral valve regurgitation. No evidence of mitral valve stenosis. Tricuspid Valve: The tricuspid valve is normal in structure. Tricuspid valve regurgitation is mild to moderate. No evidence of tricuspid stenosis. Aortic Valve: The aortic valve is normal in structure. Aortic valve regurgitation is not visualized. No aortic stenosis is present. Aortic valve peak gradient measures 3.8 mmHg. Pulmonic Valve: The pulmonic valve was normal in structure. Pulmonic valve regurgitation is mild. No evidence of pulmonic stenosis. Aorta: The aortic root is normal in size and structure. Venous: The inferior vena cava is normal in size with greater than 50% respiratory variability, suggesting right atrial pressure of 3 mmHg. IAS/Shunts: No atrial level shunt detected by color flow Doppler.  LEFT VENTRICLE PLAX 2D LVIDd:         5.90 cm      Diastology LVIDs:         5.30 cm      LV e' medial:    4.38 cm/s LV PW:         1.20 cm      LV E/e' medial:  23.1 LV IVS:        1.20 cm      LV e' lateral:   6.31 cm/s LVOT diam:     2.00 cm      LV E/e' lateral: 16.0 LV SV:         32 LV SV Index:   11 LVOT Area:     3.14 cm  LV Volumes (MOD) LV vol d,  MOD A2C: 244.0 ml LV vol d, MOD A4C: 185.0 ml LV vol s, MOD A2C: 188.0 ml LV vol s, MOD A4C: 144.0 ml LV SV MOD A2C:     56.0 ml LV SV MOD A4C:     185.0 ml LV SV MOD BP:      43.6 ml RIGHT VENTRICLE            IVC RV Basal diam:  5.10 cm    IVC diam: 2.70 cm RV Mid diam:    4.00 cm RV S prime:     6.09 cm/s TAPSE (M-mode): 1.1 cm LEFT ATRIUM             Index        RIGHT ATRIUM           Index LA diam:        4.60 cm 1.64 cm/m   RA Area:     23.30 cm LA Vol (A2C):   65.2 ml  23.29 ml/m  RA Volume:   77.10 ml  27.54 ml/m LA Vol (A4C):   81.0 ml 28.93 ml/m LA Biplane Vol: 74.6 ml 26.65 ml/m  AORTIC VALVE AV Area (Vmax): 2.26 cm AV Vmax:        97.90 cm/s AV Peak Grad:   3.8 mmHg LVOT Vmax:      70.50 cm/s LVOT Vmean:     53.400 cm/s LVOT VTI:       0.101 m  AORTA Ao Root diam: 3.00 cm Ao Asc diam:  2.40 cm MITRAL VALVE                TRICUSPID VALVE MV Area (PHT): 7.02 cm     TR Peak grad:   26.8 mmHg MV Decel Time: 108 msec     TR Vmax:        259.00 cm/s MR Peak grad: 41.7 mmHg MR Mean grad: 28.0 mmHg     SHUNTS MR Vmax:      323.00 cm/s   Systemic VTI:  0.10 m MR Vmean:     255.0 cm/s    Systemic Diam: 2.00 cm MV E velocity: 101.00 cm/s Armanda Magic MD Electronically signed by Armanda Magic MD Signature Date/Time: 05/27/2021/10:22:58 AM    Final     I independently reviewed the above imaging studies.  Impression/Recommendation Penile phimosis with inability to place Foley catheter, now with successful placement of Foley Plan/recommendation.  Continue Foley as needed for medical management.  Please reconsult as needed for Foley issues.  Belva Agee 05/28/2021, 10:59 PM    Belva Agee, MD   CC

## 2021-05-28 NOTE — Telephone Encounter (Signed)
DECISION : ? ? ? DENIED   ( for not meeting requirements )  ? ? ? ? ?Here are the policy requirements your request did not meet: ? ?Per your health plan's criteria, this drug is covered if you meet the following: ? ? ?You have tried or cannot use one preferred drug: Byetta, Ozempic or Victoza. ? ? ?The facts given does not show you have met these requirements. ? ? ?Please speak with the doctor about the available choices. ? ?This decision was made per the [employer] GLP-1 ?Receptor Agonists and Combinations Guideline. ? ? ( Copy placed to be scanned to chart  and PCP  sent a message along with this )  ? ?

## 2021-05-28 NOTE — Progress Notes (Addendum)
Foley catheter attempted x2 with 2 RN, unsuccessful. Cardiology fellow Dr. Rosita Fire notified.  ?

## 2021-05-28 NOTE — Progress Notes (Signed)
? ? ?  Ortho Tech attempted to apply unna boots but he has what looks like pressure ulcers on bilateral lower extremities with slough. 2x1 cm . Admitted with the wounds. His girlfriend had wrapped his leg.  ? ?R and LLE cool with 2-3+ lower extremity edema.  ? ?For now will cover with mepilex border and change every 3 days.  ? ? ?Camani Sesay NP-C  ?5:43 PM ? ? ? ?

## 2021-05-28 NOTE — TOC Initial Note (Signed)
Transition of Care (TOC) - Initial/Assessment Note  ? ? ?Patient Details  ?Name: Jonathan [name] ?MRN: [id number] ?Date of Birth: [birth year] ? ?Transition of Care [facility]) CM/SW Contact:    ?Marcheta Grammes Rexene Alberts, RN ?Phone Number: [phone] ?05/28/2021, 3:02 PM ? ?Clinical Narrative:                 ?Pt active is active with [facility] for Home [facility]. Will continue to follow for dc needs. Lives at home with wife.  ? ?Expected Discharge Plan: [place] ?Barriers to Discharge: Continued Medical Work up ? ? ?Patient Goals and CMS Choice ?  ?  ?Choice offered to / list presented to : Patient ? ?Expected Discharge Plan and Services ?Expected Discharge Plan: [place] ?  ?Discharge Planning Services: CM Consult ?Post Acute Care Choice: Hospice ?Living arrangements for the past 2 months: [place] ?                ?  ?  ?  ?  ?  ?HH Arranged: RN ?[place] Agency: [facility] [place] ?Date HH Agency Contacted: 05/28/21 ?Time [place]: 1501 ?Representative spoke with at [place]: Stanton Kidney ? ?Prior Living Arrangements/Services ?Living arrangements for the past 2 months: [place] ?Lives with:: Spouse ?Patient language and need for interpreter reviewed:: Yes ?       ?Need for Family Participation in Patient Care: Yes (Comment) ?Care giver support system in place?: Yes (comment) ?  ?Criminal Activity/Legal Involvement Pertinent to Current Situation/Hospitalization: No - Comment as needed ? ?Activities of Daily Living ?Home Assistive Devices/Equipment: None ?ADL Screening (condition at time of admission) ?Patient's cognitive ability adequate to safely complete daily activities?: Yes ?Is the patient deaf or have difficulty hearing?: No ?Does the patient have difficulty seeing, even when wearing glasses/contacts?: No ?Does the patient have difficulty concentrating, remembering, or making decisions?: No ?Patient able to express need for assistance with ADLs?: Yes ?Does  the patient have difficulty dressing or bathing?: Yes ?Independently performs ADLs?: Yes (appropriate for developmental age) ?Does the patient have difficulty walking or climbing stairs?: Yes ?Weakness of Legs: None ?Weakness of Arms/Hands: None ? ?Permission Sought/Granted ?Permission sought to share information with : Case Manager ?Permission granted to share information with : Yes, Verbal Permission Granted ? Share Information with NAME: Hanz Jeck ?   ? Permission granted to share info w Relationship: wife ? Permission granted to share info w Contact Information: [phone] ? ?Emotional Assessment ?  ?  ?  ?  ?  ?Psych Involvement: No (comment) ? ?Admission diagnosis:  SOB (shortness of breath) [R06.02] ?Acute on chronic systolic (congestive) heart failure ([place]) [I50.23] ?Acute HFrEF (heart failure with reduced ejection fraction) ([place]) [I50.21] ?Patient Active Problem List  ? Diagnosis Date Noted  ? Acute on chronic systolic (congestive) heart failure ([place]) 05/27/2021  ? Acute HFrEF (heart failure with reduced ejection fraction) ([place]) 05/26/2021  ? Diarrhea 05/20/2021  ? Rash 04/23/2021  ? Positive blood culture   ? Hyponatremia with excess extracellular fluid volume 04/05/2021  ? Acute renal failure ([place])   ? Hyperkalemia   ? Neck pain on left side 02/18/2021  ? Atrial flutter ([place]) 12/10/2020  ? Goals of care, counseling/discussion   ? Intertrigo 10/17/2020  ? Mold exposure 06/17/2020  ? Depression 11/06/2017  ? Charcot ankle, left 05/31/2017  ? Diabetic polyneuropathy associated with type 2 diabetes mellitus ([place]) 03/14/2017  ? OSA (obstructive sleep apnea) 12/02/2016  ? Chronic  systolic (congestive) heart failure ([place])   ? Morbid obesity with BMI of 50.0-59.9, adult ([place]) 10/26/2016  ? Asthma, chronic, mild persistent, uncomplicated [id number]  ? Essential hypertension 10/26/2016  ? Diabetes mellitus type 2 in obese ([place]) 04/20/2016  ? ?PCP:  [name], MD ?Pharmacy:   ?[id number]  [place] - [address] AT [place] [address] ?[place] ?[place] [address] ?Phone: [phone] Fax: [phone] ? ? ? ? ?Social Determinants of Health (SDOH) Interventions ?  ? ?Readmission Risk Interventions ?   ? View : No data to display.  ?  ?  ?  ? ? ? ?

## 2021-05-28 NOTE — Progress Notes (Addendum)
Orthopedic Tech Progress Note ?Patient Details:  ?Geno Sydnor ?1986/02/07 ?[id number] ? ?Kyra (ortho tech) and I arrived to apply unna boots and removed his socks, but stopped when we noticed pt had Mepilex bandages on the anterior aspect of both ankles that had been on since this past Monday (05/25/2021) per the pt. Notified RN and Tonye Becket, NP, who was outside of the room. NP removed Mepilex dressings and assessed wounds to be pressure ulcers from the previous applications of unna boots done by his girlfriend at home. NP verbally communicated to not apply unna boots at this time. PT also mentioned to myself and Kyra that the unna boot paste that he has had on previously does cause burning/irritation of his legs, I shared this with his RN before leaving the unit. ? ?Order for [employer] have been cancelled. ?  ?Patient ID: Danielle Lento, male   DOB: Jun 20, 1985, 36 y.o.   MRN: [id number] ? ?Hamda Klutts Carmine Savoy ?05/28/2021, 5:50 PM ? ?

## 2021-05-28 NOTE — Progress Notes (Signed)
?  Remains in cardiogenic shock despite dual pressors.  ? ?Not stable enough for DC-CV of AFL.  ? ?We discussed is situation in detail.  ? ?Only option is to proceed with IABP support to bridge to DC-CV. In hopes to get him out of shock. ? ?He has discussed with his fiancee and wants to proceed with IAB P and swan placement.  ? ?Additional CCT 45 min.  ? ?Arvilla Meres, MD  ?6:14 PM ? ?

## 2021-05-28 NOTE — Progress Notes (Addendum)
ANTICOAGULATION CONSULT NOTE - Initial Consult ? ?Pharmacy Consult for Heparin ?Indication:  IABP  and afib ? ?Allergies  ?Allergen Reactions  ? Hydrocodone   ?  Hives   ? ? ?Patient Measurements: ?Height: 6' 2"  (188 cm) ?Weight: (!) 171.6 kg (378 lb 5 oz) ?IBW/kg (Calculated) : 82.2 ?Heparin Dosing Weight: 122 kg ? ?Vital Signs: ?Temp: 97.9 ?F (36.6 ?C) (04/13 1500) ?Temp Source: Oral (04/13 3154) ?BP: 94/63 (04/13 1940) ?Pulse Rate: 108 (04/13 1947) ? ?Labs: ?Recent Labs  ?  05/26/21 ?1813 05/26/21 ?1918 05/26/21 ?1918 05/27/21 ?1233 05/28/21 ?0086 05/28/21 ?7619 05/28/21 ?1855 05/28/21 ?1916  ?HGB  --  10.9*   < >  --   --  10.7* 12.9* 12.9*  ?HCT  --  35.4*   < >  --   --  33.2* 38.0* 38.0*  ?PLT  --  238  --   --   --  256  --   --   ?CREATININE 1.53*  --   --  1.67* 1.82*  --   --   --   ?TROPONINIHS 100*  --   --   --   --  103*  --   --   ? < > = values in this interval not displayed.  ? ? ?Estimated Creatinine Clearance: 93.7 mL/min (A) (by C-G formula based on SCr of 1.82 mg/dL (H)). ? ? ?Medical History: ?Past Medical History:  ?Diagnosis Date  ? Acute exacerbation of CHF (congestive heart failure) ([place]) 04/05/2021  ? Acute on chronic heart failure ([place]) 10/28/2020  ? Acute pain of left foot 05/03/2017  ? Asthma   ? Asthma, chronic, mild persistent, uncomplicated 50/93/2671  ? Charcot ankle, left 05/31/2017  ? CHF (congestive heart failure) ([place])   ? Chronic systolic (congestive) heart failure (HCC)   ? Diabetes mellitus ([place])   ? Diabetes mellitus type 2 in obese ([place]) 04/21/2014  ? Diabetic polyneuropathy associated with type 2 diabetes mellitus ([place]) 03/14/2017  ? Essential hypertension 10/26/2016  ? Hypertension   ? Obesity   ? Obesity, Class III, BMI 40-49.9 (morbid obesity) ([place]) 10/26/2016  ? OSA (obstructive sleep apnea) 12/02/2016  ? Type 2 diabetes mellitus with diabetic neuropathy (HCC)   ? ? ?Medications:  ?Medications Prior to Admission  ?Medication Sig Dispense Refill Last Dose  ? albuterol  (VENTOLIN HFA) 108 (90 Base) MCG/ACT inhaler Inhale 2 puffs into the lungs every 4 (four) hours as needed for wheezing or shortness of breath. 18 g 0 Past Week  ? amiodarone (PACERONE) 200 MG tablet TAKE 1/2 TABLET BY MOUTH DAILY (Patient taking differently: Take 100 mg by mouth daily.) 7 tablet 6 05/26/2021  ? apixaban (ELIQUIS) 5 MG TABS tablet Take 1 tablet (5 mg total) by mouth 2 (two) times daily. 60 tablet 2 05/26/2021 at 0800  ? azelastine (ASTELIN) 0.1 % nasal spray Place into both nostrils as needed for rhinitis. Use in each nostril as directed   05/26/2021  ? BIDIL 20-37.5 MG tablet TAKE 1 TABLET BY MOUTH THREE TIMES A DAY (Patient taking differently: Take 1 tablet by mouth 3 (three) times daily.) 45 tablet 5 05/26/2021  ? [facility] SENNA 8.6 MG tablet Take 1 tablet by mouth daily as needed for constipation.   Past Week  ? digoxin (LANOXIN) 0.125 MG tablet TAKE 1 TABLET BY MOUTH EVERY DAY (Patient taking differently: Take 0.125 mg by mouth daily.) 90 tablet 3 05/26/2021  ? Dulaglutide (TRULICITY) 1.5 IW/5.8KD SOPN Inject 1 mg into the skin once  a week. Patient takes every Monday.   05/25/2021  ? ENTRESTO 24-26 MG TAKE 1 TABLET BY MOUTH TWICE A DAY 60 tablet 3 05/26/2021  ? gabapentin (NEURONTIN) 300 MG capsule Take 300 mg by mouth as needed (pain).   Past Month  ? hyoscyamine (LEVSIN SL) 0.125 MG SL tablet Place 0.125 mg under the tongue every 4 (four) hours as needed for cramping (secretions).   Past Week  ? insulin aspart (NOVOLOG) 100 UNIT/ML injection Inject 20 Units into the skin 3 (three) times daily with meals. 20 mL 11 05/25/2021  ? insulin glargine (LANTUS) 100 UNIT/ML injection Inject 0.3 mLs (30 Units total) into the skin 2 (two) times daily. 20 mL 11 05/25/2021  ? loratadine (CLARITIN) 10 MG tablet Take 1 tablet (10 mg total) by mouth 2 (two) times daily as needed for allergies (Can use an extra dose during flare ups.). 60 tablet 5 Past Week  ? metolazone (ZAROXOLYN) 2.5 MG tablet Take 1 tablet (2.5 mg total)  by mouth once a week. On Thursdays 5 tablet 3 05/21/2021  ? nitroGLYCERIN (NITROSTAT) 0.3 MG SL tablet Place 0.3 mg under the tongue every 5 (five) minutes as needed for chest pain.   unknown  ? ondansetron (ZOFRAN) 4 MG tablet Take 4 mg by mouth daily as needed for nausea or vomiting.   05/26/2021  ? pantoprazole (PROTONIX) 40 MG tablet Take 1 tablet (40 mg total) by mouth daily. 30 tablet 1 05/26/2021  ? potassium chloride SA (KLOR-CON M) 20 MEQ tablet Take 2 tablets (40 mEq total) by mouth 2 (two) times daily. Take extra 40 meq (2 tabs) every Thursday when you take Metolazone 130 tablet 3 05/26/2021  ? spironolactone (ALDACTONE) 25 MG tablet Take 1 tablet (25 mg total) by mouth daily. 30 tablet 0 05/26/2021  ? torsemide (DEMADEX) 20 MG tablet Take 4 tablets (80 mg total) by mouth 2 (two) times daily. 240 tablet 0 05/26/2021  ? traMADol (ULTRAM) 50 MG tablet TAKE 1 TABLET BY MOUTH EVERY 6 HOURS AS NEEDED. (Patient taking differently: Take 50 mg by mouth every 6 (six) hours as needed for moderate pain.) 20 tablet 0 Past Week  ? Accu-Chek Softclix Lancets lancets Check blood sugar 3x a day 100 each 11   ? blood glucose meter kit and supplies KIT Dispense based on patient and insurance preference. Use up to four times daily as directed. (FOR ICD-9 250.00, 250.01). For QAC - HS accuchecks. May switch to any brand. 1 each 1   ? Continuous Blood Gluc Sensor (FREESTYLE LIBRE 2 SENSOR) MISC Use to check blood sugar at least 6 times a day 2 each 11   ? glucose blood (ACCU-CHEK GUIDE) test strip Check blood sugar 3 times per day 100 each 11   ? Insulin Syringe-Needle U-100 30G X 1/2" 1 ML MISC Use to inject insulin 4 times a day. 100 each 2   ? tirzepatide (MOUNJARO) 2.5 MG/0.5ML Pen INJECT 2.5 MG INTO THE SKIN ONCE A WEEK FOR 28 DAYS, THEN 5 MG ONCE A WEEK. 2 mL 1   ? ? ?Assessment: ?36 y.o. M with chronic HFrEF on home milrinone presented with cardiogenic shock. S/p IABP placed in cath lab tonight in hopes to bridge to DV-CV.  Noted pt on Eliquis PTA and in hospital for afib. Last dose taken 4/13 1208. Now pharmacy asked to dose heparin for afib and IABP. Eliquis will be affecting heparin level so will utilize aPTT for monitoring until levels correlate.  ? ?Goal  of Therapy:  ?Heparin level 0.3-0.5 units/ml ?aPTT 66-85 sec ?Monitor platelets by anticoagulation protocol: Yes ?  ?Plan:  ?Start heparin at 1300 units/hr. No bolus. ?Will f/u 8 hr aPTT and heparin level ?Daily aPTT, heparin level and CBC ? ?Sherlon Handing, PharmD, BCPS ?Please see amion for complete clinical pharmacist phone list ?05/28/2021,8:08 PM ? ? ?

## 2021-05-28 NOTE — Progress Notes (Signed)
[facility] [address] [facility]) hospitalized hospice patient. ?  ?This is a current [facility] hospice home care patient with a terminal diagnosis of hypertensive heart disease with chronic systolic CHF.  Patient contacted Dr Haroldine Laws with complaints of weight gain of > 20lbs, lower extremity edema and abdomen. Advised by Dr. Haroldine Laws to go to [facility] ED for diuresis. Patient admitted on 05/26/2021 with diagnosis of end stage CHF.  Per Dr. Tomasa Hosteller with [facility] this is a hospice related admission. ? ?This patient is appropriate for inpatient status due to need for IV medications for symptom management.  ? ?Visited at bedside with wife present. Patient sitting up, alert and oriented. No apparent distress. Continues to be in atrial flutter rhythm. May consider cardioversion later. Has complained of some intermittent abdominal pain.  ? ?VS: 97.9, 83/52, 107 AF, 28, 100% RA ?I/O: 1556.5/902 ? ?Abnormal labs: ?05/28/21 13:59 ?Total hemoglobin: 10.9 (L) ?Troponin I (High Sensitivity): 103 (HH) ?RBC: 3.60 (L) ?Hemoglobin: 10.7 (L) ?HCT: 33.2 (L) ?RDW: 16.6 (H) ?Hemoglobin A1C: 8.8 (H) ?Sodium: 127 (L) ?Chloride: 92 (L) ?Glucose: 208 (H) ?BUN: 51 (H) ?Creatinine: 1.82 (H) ?Calcium: 8.5 (L) ?05/28/21 03:19 ?GFR, Estimated: 49 (L) ?Lactic Acid, Venous: 2.2 (HH) ? ?IV/PRN medications: Amiodarone 16.31ml/20mg /hr continuous, Lasix 52ml/20mg /hr continuous, Milrinone 24.46ml/hr Dose: 0.5 mcg/kg/min continuous, Levophed Rate: 11.25 mL/hr Dose: 3 mcg/min titrated GOAL:  MAP of > 65, SBP > 90. Maalox/Mylanta 20ml PRN @ [id number], [id number], Mylicon 80mg  PO PRN @ 0153, 1459 ? ?Problem list: ?  ?Assessment/Plan  ?  ?1. A/C End Stage HFrEF , NICM -->Cardiogenic Shock ?EF has been down for some time.   Echo 10/2020 EF < 20% and moderate RV dysfunction. He has been on home milrinone 0.375 since 9/22.  Not thought to be candidate for advanced therapies with obesity and noncompliance. Echo in 1/23 reviewed, shows EF < 20%, mild LV dilation, severe RV  dysfunction with moderate RV enlargement.  ?-On home milrinone 0.375 mcg. Todays CO-OX is 33% on repeat. Increase milrinone 0.5 mcg. Concerned we are running out of time . Not a candidate for LVAD due to RV failure.  ?- CVP >20. Continue lasix drip at 20 mg per hour. Give 2.5 mg metolazone. Poor urine output. Bladder scan -->minimal urine.  ?-No BB with low out ut. ?- Continue digoxin. Dig level 0.4 ?- Stop Arlyce Harman and entresto with worsening renal function.  ?- Continue bidil.  ?- No SGLT2i due to fungal infections.  ?  ?2. DMII ?-Will need SSI while hospitalized.  ?-Check Hgb A1 C ?  ?3. CKD Stage IIIb  ?-Creatinine baseline  ?-Admit creatinine 1.5--->1.8 today.   ?  ?4. PAF--->A Flutter RVR  ?-Start amio drip. Give 150 mg bolus now.   ?- Stop po amio.  ?- Continue eliquis 5 mg twice a day  ?  ?5. OSA: Severe AHI 65 ?Does not uses CPAP  ?  ?6. Obesity  ?He was supposed to start [facility] but Medicaid denied it.  ?He has continued on Trulicity.  ?Body mass index is 48.57 kg/m?. ?  ?7. ID ?H/O  2/23 Blood CX Enterococcus faecalis and Staph epidermidis bacteremia. Treated with IV antibiotics. ?  ?8. Abdominal Pain  ?-Suspect due to low output heart failure.  ?- Check CBC. Consider CT of abdomen.  ?  ?9. Acute Hypoxic Respiratory Failure ?-Decompensated this morning with sats in 70s. Place [place] with sats > 90%>  ?  ?10. GOC  ?-Active with [place] ?-DNR ?-We discussed we may been running out of  time.  ?  ?He decompensated after I left the room with heart rate 130----160s and sats in the 70s.  ?  ?  EKG A flutter RVR. Start amio drip. He has not missed any doses of eliquis. Will need DC-CV later today if no improvement. NPO. Bedrest.  ?  ?Sats improved with [place] oxygen.  ?  ?Check lactic acid, magnesium,  HS trop, CBC now.  ?  ?Discussed with Dr Haroldine Laws. Discussed with his wife.  ? ?Discharge planning: return home with hospice when stable for discharge ?Family Contact: Wife at bedside ?IDG: updated ?GOC: on going.  Remains a full code.  ?  ?Should patient need ambulance transport at discharge please use [facility] as [facility] contracts this service with them for our active hospice patients. ?  ?A Please do not hesitate to call with questions.   ?Thank you,   ?Farrel Gordon, RN, CCM      ?[facility] Liaison   ?[phone] ?

## 2021-05-28 NOTE — Progress Notes (Signed)
? ?  Discussed with Dr Gala Romney.  ? ?Move to 2H to try and stabilize.  ? ?Possible cardioversion later.  ? ?Continue milrinone 0.5 mcg + amio drip + lasix drip.  ? ?Repeat CO-OX now.  ? ?Ninoska Goswick NP-C  ?9:29 AM ? ?

## 2021-05-28 NOTE — Progress Notes (Signed)
Moves to [facility]  ? ?Remains in A flutter 110-120s. No plan for cardioversion until stabilized.  ? ?Currently on milrinone 0.5 + amio 60 mg per + lasix drip.  ? ?Repeat CO-OX 43%.  ? ?Add norepi 3 mcg. No titration.  ? ?Check CO-OX in am.  ? ?Haileigh Pitz NP-C  ?2:17 PM ? ?

## 2021-05-28 NOTE — Interval H&P Note (Signed)
History and Physical Interval Note: ? ?05/28/2021 ?6:30 PM ? ?Jonathan [name]  has presented today for surgery, with the diagnosis of Congestive heart failure.  The various methods of treatment have been discussed with the patient and family. After consideration of risks, benefits and other options for treatment, the patient has consented to  Procedure(s): ?RIGHT HEART CATH (N/A) ?IABP Insertion (N/A) as a surgical intervention.  The patient's history has been reviewed, patient examined, no change in status, stable for surgery.  I have reviewed the patient's chart and labs.  Questions were answered to the patient's satisfaction.   ? ? ?[name] ? ? ?

## 2021-05-28 NOTE — Progress Notes (Signed)
? ?  Called by staff RN to talk with [name] about code status.  ? ?Jonathan [name] is requesting to change from no code to full code.  ? ?Remains on amio drip + milrinone 0.5 mcg + norepi 3 mcg.  ? ?Order changed to full code. Check CO-OX  ? ? ?SBP remains low.  Increase norepi to 5 mcg.  ? ?Sofiya Ezelle NP-C  ?4:58 PM ? ?

## 2021-05-28 NOTE — Progress Notes (Addendum)
? ? Advanced Heart Failure Rounding Note ? ?PCP-Cardiologist: Armanda Magic, MD  ? ?Subjective:   ? ?Admitted with volume overload. On home milrinone 0.375 mcg. Started on lasix drip.  ? ?4/12 lasix drip increased to 20 mg per hour. Sluggish diuresis. Creatinine trending up 1.5>1.6>1.8 ? ?Milrinone 0.375 mcg -->CO-OX < 40% after repeat.  ? ? ?Complaining of intermittent abdominal pain.  ? ? ?Objective:   ?Weight Range: ?(!) 171.6 kg ?Body mass index is 48.57 kg/m?.  ? ?Vital Signs:   ?Temp:  [97.8 ?F (36.6 ?C)-98.7 ?F (37.1 ?C)] 97.8 ?F (36.6 ?C) (04/13 0320) ?Pulse Rate:  [114-228] 114 (04/12 2321) ?Resp:  [15-20] 20 (04/13 0320) ?BP: (99-130)/(59-94) 113/89 (04/13 0320) ?SpO2:  [90 %-100 %] 100 % (04/13 0320) ?Weight:  [170.4 kg-171.6 kg] 171.6 kg (04/13 0313) ?Last BM Date : 05/26/21 ? ?Weight change: ?Filed Weights  ? 05/27/21 1604 05/28/21 0313  ?Weight: (!) 170.4 kg (!) 171.6 kg  ? ? ?Intake/Output:  ? ?Intake/Output Summary (Last 24 hours) at 05/28/2021 0710 ?Last data filed at 05/28/2021 0500 ?Gross per 24 hour  ?Intake 1556.46 ml  ?Output 902 ml  ?Net 654.46 ml  ?  ? ? ?Physical Exam  ? CVP 22-23  ?General:  Sitting in the chair. No resp difficulty ?HEENT: Normal ?Neck: Supple. JVP to jaw . Carotids 2+ bilat; no bruits. No lymphadenopathy or thyromegaly appreciated. ?Cor: PMI nondisplaced. Regular rate & rhythm. No rubs or murmurs. +S3  ?Lungs: Clear ?Abdomen: obese, Soft, diffuse tenderness, nondistended. No hepatosplenomegaly. No bruits or masses. Good bowel sounds. ?Extremities: No cyanosis, clubbing, rash, edema. RUE PICC  ?Neuro: Alert & orientedx3, cranial nerves grossly intact. moves all 4 extremities w/o difficulty. Affect pleasant ? ? ?Telemetry  ? ?ST 120s  ? ?EKG  ?  ?N/A ? ?Labs  ?  ?CBC ?Recent Labs  ?  05/26/21 ?1918  ?WBC 8.1  ?NEUTROABS 5.8  ?HGB 10.9*  ?HCT 35.4*  ?MCV 97.0  ?PLT 238  ? ?Basic Metabolic Panel ?Recent Labs  ?  05/27/21 ?1233 05/28/21 ?0319  ?NA 130* 127*  ?K 4.1 4.4  ?CL 91*  92*  ?CO2 27 26  ?GLUCOSE 201* 208*  ?BUN 52* 51*  ?CREATININE 1.67* 1.82*  ?CALCIUM 9.1 8.5*  ?MG 1.7 2.2  ? ?Liver Function Tests ?Recent Labs  ?  05/26/21 ?1813  ?AST 25  ?ALT 23  ?ALKPHOS 81  ?BILITOT 1.0  ?PROT 7.8  ?ALBUMIN 3.4*  ? ?Recent Labs  ?  05/26/21 ?1813  ?LIPASE 22  ? ?Cardiac Enzymes ?No results for input(s): CKTOTAL, CKMB, CKMBINDEX, TROPONINI in the last 72 hours. ? ?BNP: ?BNP (last 3 results) ?Recent Labs  ?  03/12/21 ?1505 04/05/21 ?0148 05/26/21 ?1813  ?BNP 448.3* 460.5* 342.4*  ? ? ?ProBNP (last 3 results) ?No results for input(s): PROBNP in the last 8760 hours. ? ? ?D-Dimer ?No results for input(s): DDIMER in the last 72 hours. ?Hemoglobin A1C ?No results for input(s): HGBA1C in the last 72 hours. ?Fasting Lipid Panel ?No results for input(s): CHOL, HDL, LDLCALC, TRIG, CHOLHDL, LDLDIRECT in the last 72 hours. ?Thyroid Function Tests ?No results for input(s): TSH, T4TOTAL, T3FREE, THYROIDAB in the last 72 hours. ? ?Invalid input(s): FREET3 ? ?Other results: ? ? ?Imaging  ? ? ?ECHOCARDIOGRAM COMPLETE ? ?Result Date: 05/27/2021 ?   ECHOCARDIOGRAM REPORT   Patient Name:   Jonathan [name] Date of Exam: 05/27/2021 Medical Rec #:  [id number]     Height:       74.0 in  Accession #:    [id number]    Weight:       362.8 lb Date of Birth:  [birth year]      BSA:          2.799 m? Patient Age:    36 years      BP:           112/75 mmHg Patient Gender: M             HR:           118 bpm. Exam Location:  Inpatient Procedure: Cardiac Doppler, Color Doppler, 2D Echo and Intracardiac            Opacification Agent Indications:    CHF  History:        Patient has prior history of Echocardiogram examinations. CHF;                 Risk Factors:Hypertension, Diabetes and Sleep Apnea.  Sonographer:    Cleatis Polka Referring Phys: [id number] FAN YE  Sonographer Comments: Patient is morbidly obese. Image acquisition challenging due to patient body habitus. IMPRESSIONS  1. Left ventricular ejection fraction, by estimation, is  <20%. The left ventricle has severely decreased function. The left ventricle demonstrates global hypokinesis. The left ventricular internal cavity size was mildly dilated. Left ventricular diastolic function could not be evaluated. Elevated left ventricular end-diastolic pressure.  2. Right ventricular systolic function is severely reduced. The right ventricular size is moderately enlarged. There is mildly elevated pulmonary artery systolic pressure. The estimated right ventricular systolic pressure is 41.8 mmHg.  3. The mitral valve is normal in structure. Mild mitral valve regurgitation. No evidence of mitral stenosis.  4. Tricuspid valve regurgitation is mild to moderate.  5. The aortic valve is normal in structure. Aortic valve regurgitation is not visualized. No aortic stenosis is present.  6. The inferior vena cava is normal in size with greater than 50% respiratory variability, suggesting right atrial pressure of 3 mmHg. FINDINGS  Left Ventricle: Left ventricular ejection fraction, by estimation, is <20%. The left ventricle has severely decreased function. The left ventricle demonstrates global hypokinesis. Definity contrast agent was given IV to delineate the left ventricular endocardial borders. The left ventricular internal cavity size was mildly dilated. There is no left ventricular hypertrophy. Abnormal (paradoxical) septal motion, consistent with left bundle branch block. Left ventricular diastolic function could not be evaluated. Elevated left ventricular end-diastolic pressure. Right Ventricle: The right ventricular size is moderately enlarged. No increase in right ventricular wall thickness. Right ventricular systolic function is severely reduced. There is mildly elevated pulmonary artery systolic pressure. The tricuspid regurgitant velocity is 2.59 m/s, and with an assumed right atrial pressure of 15 mmHg, the estimated right ventricular systolic pressure is 41.8 mmHg. Left Atrium: Left atrial size  was normal in size. Right Atrium: Right atrial size was normal in size. Pericardium: There is no evidence of pericardial effusion. Mitral Valve: The mitral valve is normal in structure. Mild mitral valve regurgitation. No evidence of mitral valve stenosis. Tricuspid Valve: The tricuspid valve is normal in structure. Tricuspid valve regurgitation is mild to moderate. No evidence of tricuspid stenosis. Aortic Valve: The aortic valve is normal in structure. Aortic valve regurgitation is not visualized. No aortic stenosis is present. Aortic valve peak gradient measures 3.8 mmHg. Pulmonic Valve: The pulmonic valve was normal in structure. Pulmonic valve regurgitation is mild. No evidence of pulmonic stenosis. Aorta: The aortic root is normal in size and structure. Venous: The inferior vena  cava is normal in size with greater than 50% respiratory variability, suggesting right atrial pressure of 3 mmHg. IAS/Shunts: No atrial level shunt detected by color flow Doppler.  LEFT VENTRICLE PLAX 2D LVIDd:         5.90 cm      Diastology LVIDs:         5.30 cm      LV e' medial:    4.38 cm/s LV PW:         1.20 cm      LV E/e' medial:  23.1 LV IVS:        1.20 cm      LV e' lateral:   6.31 cm/s LVOT diam:     2.00 cm      LV E/e' lateral: 16.0 LV SV:         32 LV SV Index:   11 LVOT Area:     3.14 cm?  LV Volumes (MOD) LV vol d, MOD A2C: 244.0 ml LV vol d, MOD A4C: 185.0 ml LV vol s, MOD A2C: 188.0 ml LV vol s, MOD A4C: 144.0 ml LV SV MOD A2C:     56.0 ml LV SV MOD A4C:     185.0 ml LV SV MOD BP:      43.6 ml RIGHT VENTRICLE            IVC RV Basal diam:  5.10 cm    IVC diam: 2.70 cm RV Mid diam:    4.00 cm RV S prime:     6.09 cm/s TAPSE (M-mode): 1.1 cm LEFT ATRIUM             Index        RIGHT ATRIUM           Index LA diam:        4.60 cm 1.64 cm/m?   RA Area:     23.30 cm? LA Vol (A2C):   65.2 ml 23.29 ml/m?  RA Volume:   77.10 ml  27.54 ml/m? LA Vol (A4C):   81.0 ml 28.93 ml/m? LA Biplane Vol: 74.6 ml 26.65 ml/m?  AORTIC  VALVE AV Area (Vmax): 2.26 cm? AV Vmax:        97.90 cm/s AV Peak Grad:   3.8 mmHg LVOT Vmax:      70.50 cm/s LVOT Vmean:     53.400 cm/s LVOT VTI:       0.101 m  AORTA Ao Root diam: 3.00 cm Ao Asc diam:

## 2021-05-28 NOTE — H&P (View-Only) (Signed)
?  Remains in cardiogenic shock despite dual pressors.  ? ?Not stable enough for DC-CV of AFL.  ? ?We discussed is situation in detail.  ? ?Only option is to proceed with IABP support to bridge to DC-CV. In hopes to get him out of shock. ? ?He has discussed with his fiancee and wants to proceed with IAB P and swan placement.  ? ?Additional CCT 45 min.  ? ?Delanee Xin, MD  ?6:14 PM ? ?

## 2021-05-28 NOTE — Telephone Encounter (Signed)
Pt PCP reached out for   a PA  [facility] )  was submitted with office notes ,hospital notes and labs from office visit and hospital .. awaiting approval or denial  ?

## 2021-05-29 ENCOUNTER — Inpatient Hospital Stay (HOSPITAL_COMMUNITY): Payer: Medicaid Other

## 2021-05-29 ENCOUNTER — Encounter (HOSPITAL_COMMUNITY)
Admission: EM | Disposition: A | Discharge status: Hospice | Payer: Self-pay | Source: Home / Self Care | Attending: Internal Medicine

## 2021-05-29 ENCOUNTER — Encounter (HOSPITAL_COMMUNITY): Payer: Self-pay | Admitting: Internal Medicine

## 2021-05-29 ENCOUNTER — Inpatient Hospital Stay (HOSPITAL_COMMUNITY): Payer: Medicaid Other | Admitting: Anesthesiology

## 2021-05-29 DIAGNOSIS — I110 Hypertensive heart disease with heart failure: Secondary | ICD-10-CM

## 2021-05-29 DIAGNOSIS — F419 Anxiety disorder, unspecified: Secondary | ICD-10-CM | POA: Diagnosis not present

## 2021-05-29 DIAGNOSIS — I4892 Unspecified atrial flutter: Secondary | ICD-10-CM

## 2021-05-29 DIAGNOSIS — I5022 Chronic systolic (congestive) heart failure: Secondary | ICD-10-CM | POA: Diagnosis not present

## 2021-05-29 DIAGNOSIS — I509 Heart failure, unspecified: Secondary | ICD-10-CM | POA: Diagnosis not present

## 2021-05-29 DIAGNOSIS — I340 Nonrheumatic mitral (valve) insufficiency: Secondary | ICD-10-CM

## 2021-05-29 DIAGNOSIS — M14672 Charcot's joint, left ankle and foot: Secondary | ICD-10-CM | POA: Diagnosis not present

## 2021-05-29 DIAGNOSIS — G4733 Obstructive sleep apnea (adult) (pediatric): Secondary | ICD-10-CM | POA: Diagnosis not present

## 2021-05-29 DIAGNOSIS — E669 Obesity, unspecified: Secondary | ICD-10-CM | POA: Diagnosis not present

## 2021-05-29 DIAGNOSIS — R570 Cardiogenic shock: Secondary | ICD-10-CM | POA: Diagnosis not present

## 2021-05-29 DIAGNOSIS — I130 Hypertensive heart and chronic kidney disease with heart failure and stage 1 through stage 4 chronic kidney disease, or unspecified chronic kidney disease: Secondary | ICD-10-CM | POA: Diagnosis not present

## 2021-05-29 DIAGNOSIS — Z9981 Dependence on supplemental oxygen: Secondary | ICD-10-CM | POA: Diagnosis not present

## 2021-05-29 DIAGNOSIS — J45909 Unspecified asthma, uncomplicated: Secondary | ICD-10-CM | POA: Diagnosis not present

## 2021-05-29 DIAGNOSIS — I5084 End stage heart failure: Secondary | ICD-10-CM | POA: Diagnosis not present

## 2021-05-29 DIAGNOSIS — M545 Low back pain, unspecified: Secondary | ICD-10-CM | POA: Diagnosis not present

## 2021-05-29 DIAGNOSIS — F32A Depression, unspecified: Secondary | ICD-10-CM | POA: Diagnosis not present

## 2021-05-29 DIAGNOSIS — J9691 Respiratory failure, unspecified with hypoxia: Secondary | ICD-10-CM | POA: Diagnosis not present

## 2021-05-29 DIAGNOSIS — N189 Chronic kidney disease, unspecified: Secondary | ICD-10-CM | POA: Diagnosis not present

## 2021-05-29 DIAGNOSIS — L304 Erythema intertrigo: Secondary | ICD-10-CM | POA: Diagnosis not present

## 2021-05-29 DIAGNOSIS — I4891 Unspecified atrial fibrillation: Secondary | ICD-10-CM | POA: Diagnosis not present

## 2021-05-29 DIAGNOSIS — J302 Other seasonal allergic rhinitis: Secondary | ICD-10-CM | POA: Diagnosis not present

## 2021-05-29 DIAGNOSIS — E1142 Type 2 diabetes mellitus with diabetic polyneuropathy: Secondary | ICD-10-CM | POA: Diagnosis not present

## 2021-05-29 DIAGNOSIS — T80212D Local infection due to central venous catheter, subsequent encounter: Secondary | ICD-10-CM | POA: Diagnosis not present

## 2021-05-29 DIAGNOSIS — I25119 Atherosclerotic heart disease of native coronary artery with unspecified angina pectoris: Secondary | ICD-10-CM | POA: Diagnosis not present

## 2021-05-29 DIAGNOSIS — Z794 Long term (current) use of insulin: Secondary | ICD-10-CM | POA: Diagnosis not present

## 2021-05-29 DIAGNOSIS — E1122 Type 2 diabetes mellitus with diabetic chronic kidney disease: Secondary | ICD-10-CM | POA: Diagnosis not present

## 2021-05-29 HISTORY — PX: CARDIOVERSION: SHX1299

## 2021-05-29 LAB — BASIC METABOLIC PANEL
Anion gap: 11 (ref 5–15)
Anion gap: 12 (ref 5–15)
BUN: 60 mg/dL — ABNORMAL HIGH (ref 6–20)
BUN: 59 mg/dL — ABNORMAL HIGH (ref 6–20)
CO2: 25 mmol/L (ref 22–32)
CO2: 26 mmol/L (ref 22–32)
Calcium: 8.7 mg/dL — ABNORMAL LOW (ref 8.9–10.3)
Calcium: 8.7 mg/dL — ABNORMAL LOW (ref 8.9–10.3)
Chloride: 86 mmol/L — ABNORMAL LOW (ref 98–111)
Chloride: 87 mmol/L — ABNORMAL LOW (ref 98–111)
Creatinine, Ser: 2.36 mg/dL — ABNORMAL HIGH (ref 0.61–1.24)
Creatinine, Ser: 2.22 mg/dL — ABNORMAL HIGH (ref 0.61–1.24)
GFR, Estimated: 36 mL/min — ABNORMAL LOW (ref >60)
GFR, Estimated: 38 mL/min — ABNORMAL LOW (ref >60)
Glucose, Bld: 208 mg/dL — ABNORMAL HIGH (ref 70–99)
Glucose, Bld: 213 mg/dL — ABNORMAL HIGH (ref 70–99)
Potassium: 4.6 mmol/L (ref 3.5–5.1)
Potassium: 4.5 mmol/L (ref 3.5–5.1)
Sodium: 122 mmol/L — ABNORMAL LOW (ref 135–145)
Sodium: 125 mmol/L — ABNORMAL LOW (ref 135–145)

## 2021-05-29 LAB — CBC
HCT: 33.2 % — ABNORMAL LOW (ref 39.0–52.0)
Hemoglobin: 11 g/dL — ABNORMAL LOW (ref 13.0–17.0)
MCH: 30.2 pg (ref 26.0–34.0)
MCHC: 33.1 g/dL (ref 30.0–36.0)
MCV: 91.2 fL (ref 80.0–100.0)
Platelets: 259 10*3/uL (ref 150–400)
RBC: 3.64 MIL/uL — ABNORMAL LOW (ref 4.22–5.81)
RDW: 16.6 % — ABNORMAL HIGH (ref 11.5–15.5)
WBC: 8.4 10*3/uL (ref 4.0–10.5)
nRBC: 0 % (ref 0.0–0.2)

## 2021-05-29 LAB — COOXEMETRY PANEL
Carboxyhemoglobin: 1.6 % — ABNORMAL HIGH (ref 0.5–1.5)
Methemoglobin: <0.7 % (ref 0.0–1.5)
O2 Saturation: 62.2 %
Total hemoglobin: 10.2 g/dL — ABNORMAL LOW (ref 12.0–16.0)

## 2021-05-29 LAB — HEPATIC FUNCTION PANEL
ALT: 23 U/L (ref 0–44)
AST: 28 U/L (ref 15–41)
Albumin: 3.2 g/dL — ABNORMAL LOW (ref 3.5–5.0)
Alkaline Phosphatase: 86 U/L (ref 38–126)
Bilirubin, Direct: 0.5 mg/dL — ABNORMAL HIGH (ref 0.0–0.2)
Indirect Bilirubin: 0.8 mg/dL (ref 0.3–0.9)
Total Bilirubin: 1.3 mg/dL — ABNORMAL HIGH (ref 0.3–1.2)
Total Protein: 7.5 g/dL (ref 6.5–8.1)

## 2021-05-29 LAB — APTT
aPTT: 37 seconds — ABNORMAL HIGH (ref 24–36)
aPTT: 39 seconds — ABNORMAL HIGH (ref 24–36)
aPTT: 57 seconds — ABNORMAL HIGH (ref 24–36)

## 2021-05-29 LAB — MAGNESIUM: Magnesium: 2.4 mg/dL (ref 1.7–2.4)

## 2021-05-29 LAB — HEPARIN LEVEL (UNFRACTIONATED): Heparin Unfractionated: >1.1 IU/mL — ABNORMAL HIGH (ref 0.30–0.70)

## 2021-05-29 LAB — DIGOXIN LEVEL: Digoxin Level: 1.5 ng/mL (ref 0.8–2.0)

## 2021-05-29 LAB — GLUCOSE, CAPILLARY
Glucose-Capillary: 235 mg/dL — ABNORMAL HIGH (ref 70–99)
Glucose-Capillary: 236 mg/dL — ABNORMAL HIGH (ref 70–99)
Glucose-Capillary: 235 mg/dL — ABNORMAL HIGH (ref 70–99)
Glucose-Capillary: 257 mg/dL — ABNORMAL HIGH (ref 70–99)

## 2021-05-29 SURGERY — CARDIOVERSION
Anesthesia: General

## 2021-05-29 MED ORDER — ETOMIDATE 2 MG/ML IV SOLN
INTRAVENOUS | Status: DC | PRN
  Administered 2021-05-29: 10 mg via INTRAVENOUS

## 2021-05-29 MED ORDER — METOLAZONE 5 MG PO TABS
5.0000 mg | ORAL_TABLET | Freq: Once | ORAL | Status: AC
  Administered 2021-05-29: 5 mg via ORAL
  Filled 2021-05-29: qty 1

## 2021-05-29 MED ORDER — POLYETHYLENE GLYCOL 3350 17 G PO PACK
17.0000 g | PACK | Freq: Every day | ORAL | Status: DC
  Administered 2021-05-29 – 2021-05-30 (×2): 17 g via ORAL
  Filled 2021-05-29 (×2): qty 1

## 2021-05-29 MED ORDER — INSULIN GLARGINE-YFGN 100 UNIT/ML ~~LOC~~ SOLN
10.0000 [IU] | Freq: Every day | SUBCUTANEOUS | Status: DC
  Administered 2021-05-29: 10 [IU] via SUBCUTANEOUS
  Filled 2021-05-29 (×2): qty 0.1

## 2021-05-29 MED ORDER — MIDAZOLAM HCL 2 MG/2ML IJ SOLN
INTRAMUSCULAR | Status: DC | PRN
  Administered 2021-05-29: 2 mg via INTRAVENOUS

## 2021-05-29 MED ORDER — TOLVAPTAN 15 MG PO TABS
15.0000 mg | ORAL_TABLET | Freq: Once | ORAL | Status: AC
  Administered 2021-05-29: 15 mg via ORAL
  Filled 2021-05-29: qty 1

## 2021-05-29 MED ORDER — NOREPINEPHRINE 16 MG/250ML-% IV SOLN
0.0000 ug/min | INTRAVENOUS | Status: DC
  Administered 2021-05-29 (×2): 15 ug/min via INTRAVENOUS
  Administered 2021-05-30: 18.027 ug/min via INTRAVENOUS
  Administered 2021-05-31: 18 ug/min via INTRAVENOUS
  Administered 2021-06-01 – 2021-06-03 (×5): 17 ug/min via INTRAVENOUS
  Filled 2021-05-29 (×9): qty 250

## 2021-05-29 MED ORDER — ETOMIDATE 2 MG/ML IV SOLN
INTRAVENOUS | Status: AC
  Filled 2021-05-29: qty 10

## 2021-05-29 MED ORDER — MIDAZOLAM HCL 2 MG/2ML IJ SOLN
INTRAMUSCULAR | Status: AC
  Filled 2021-05-29: qty 2

## 2021-05-29 MED FILL — Midazolam HCl Inj 2 MG/2ML (Base Equivalent): INTRAMUSCULAR | Qty: 2 | Status: AC

## 2021-05-29 MED FILL — Heparin Sod (Porcine)-NaCl IV Soln 1000 Unit/500ML-0.9%: INTRAVENOUS | Qty: 1000 | Status: AC

## 2021-05-29 NOTE — Progress Notes (Signed)
Inpatient Diabetes Program Recommendations ? ?AACE/ADA: New Consensus Statement on Inpatient Glycemic Control  ? ?Target Ranges:  Prepandial:   less than 140 mg/dL ?     Peak postprandial:   less than 180 mg/dL (1-2 hours) ?     Critically ill patients:  140 - 180 mg/dL  ? ? Latest Reference Range & Units 05/28/21 07:52 05/28/21 12:04 05/28/21 16:05 05/28/21 20:10 05/28/21 23:25 05/29/21 06:28  ?Glucose-Capillary 70 - 99 mg/dL 962 (H) 229 (H) 798 (H) 182 (H) 197 (H) 235 (H)  ? ?Review of Glycemic Control ? ?Diabetes history: DM2 ?Outpatient Diabetes medications: Trulicity 1 mg Qweek, Lantus 30 units BID, Novolog 20 units TID with meals ?Current orders for Inpatient glycemic control: Novolog 0-20 units TID with meals, Novolog 0-5 units QHS ? ?Inpatient Diabetes Program Recommendations:   ? ?Insulin: Please consider ordering Semglee 10 units daily (to start now). ? ?Thanks, ?Orlando Penner, RN, MSN, CDE ?Diabetes Coordinator ?Inpatient Diabetes Program ?[phone] (Team Pager from 8am to 5pm) ? ? ? ?

## 2021-05-29 NOTE — Progress Notes (Signed)
Orthopedic Tech Progress Note ?Patient Details:  ?Jonathan [name] ?[birth year] ?[id number] ? ?Ortho Devices ?Type of Ortho Device: Knee Immobilizer ?Ortho Device/Splint Location: rle ?Ortho Device/Splint Interventions: Ordered, Application, Adjustment ?  ?Post Interventions ?Patient Tolerated: Well ? ?Edwina Barth ?05/29/2021, 1:38 AM ? ?

## 2021-05-29 NOTE — Progress Notes (Signed)
Patient states he wears BiPAP at home but doesn't know settings, however, notes state he doesn't wear at home. Placed patient on auto- titrate CPAP settings since settings are unknown. 4L O2 bled in. Patient resting comfortably. Vitals stable.  ?

## 2021-05-29 NOTE — Anesthesia Preprocedure Evaluation (Signed)
Anesthesia Evaluation  ?Patient identified by MRN, date of birth, ID band ?Patient awake ? ? ? ?Reviewed: ?Allergy & Precautions, NPO status , Patient's Chart, lab work & pertinent test results ? ?Airway ?Mallampati: III ? ?TM Distance: >3 FB ?Neck ROM: Full ? ? ? Dental ? ?(+) Dental Advisory Given ?  ?Pulmonary ?asthma , sleep apnea (noncompliant w/ CPAP) ,  ?AHRF on arrival d/t cardiogenic shock, now stable on 4LPM [place]  ?  ?Pulmonary exam normal ?breath sounds clear to auscultation ? ? ? ? ? ? Cardiovascular ?hypertension, Pt. on medications ?pulmonary hypertension+CHF (severe RV failure, LVEF<20%)  ?+ dysrhythmias Atrial Fibrillation + Valvular Problems/Murmurs (mild MR) MR  ?Rhythm:Irregular Rate:Normal ? ?HF patient known to service now in shock- yesterday: home Milrinone increased to 0.5, NE added. IABP and SWAN placed d/t ongoing shock. ? ?Last echo ??1. Left ventricular ejection fraction, by estimation, is <20%. The left  ?ventricle has severely decreased function. The left ventricle demonstrates  ?global hypokinesis. The left ventricular internal cavity size was mildly  ?dilated. Left ventricular  ?diastolic function could not be evaluated. Elevated left ventricular  ?end-diastolic pressure.  ??2. Right ventricular systolic function is severely reduced. The right  ?ventricular size is moderately enlarged. There is mildly elevated  ?pulmonary artery systolic pressure. The estimated right ventricular  ?systolic pressure is 41.8 mmHg.  ??3. The mitral valve is normal in structure. Mild mitral valve  ?regurgitation. No evidence of mitral stenosis.  ??4. Tricuspid valve regurgitation is mild to moderate.  ??5. The aortic valve is normal in structure. Aortic valve regurgitation is  ?not visualized. No aortic stenosis is present.  ??6. The inferior vena cava is normal in size with greater than 50%  ?respiratory variability, suggesting right atrial pressure of 3 mmHg.  ?   ?Neuro/Psych ?PSYCHIATRIC DISORDERS Depression negative neurological ROS ?   ? GI/Hepatic ?negative GI ROS, Neg liver ROS,   ?Endo/Other  ?diabetes, Type 2, Insulin DependentMorbid obesityBMI 49 ? Renal/GU ?CRFRenal diseaseCr 2.36  ?negative genitourinary ?  ?Musculoskeletal ?negative musculoskeletal ROS ?(+)  ? Abdominal ?(+) + obese,   ?Peds ?negative pediatric ROS ?(+)  Hematology ? ?(+) REFUSES BLOOD PRODUCTS, Hb 11   ?Anesthesia Other Findings ? ? Reproductive/Obstetrics ?negative OB ROS ? ?  ? ? ? ? ? ? ? ? ? ? ? ? ? ?  ?  ? ? ? ? ? ? ? ? ?Anesthesia Physical ?Anesthesia Plan ? ?ASA: 4 ? ?Anesthesia Plan: General  ? ?Post-op Pain Management:   ? ?Induction: Intravenous ? ?PONV Risk Score and Plan: TIVA and Treatment may vary due to age or medical condition ? ?Airway Management Planned: Natural Airway and Mask ? ?Additional Equipment: None ? ?Intra-op Plan:  ? ?Post-operative Plan:  ? ?Informed Consent: I have reviewed the patients History and Physical, chart, labs and discussed the procedure including the risks, benefits and alternatives for the proposed anesthesia with the patient or authorized representative who has indicated his/her understanding and acceptance.  ? ? ? ? ? ?Plan Discussed with: CRNA ? ?Anesthesia Plan Comments:   ? ? ? ? ? ? ?Anesthesia Quick Evaluation ? ?

## 2021-05-29 NOTE — Progress Notes (Signed)
ANTICOAGULATION CONSULT NOTE ? ?Pharmacy Consult for Heparin ?Indication:  IABP  and afib ? ?Allergies  ?Allergen Reactions  ? Hydrocodone   ?  Hives   ? ? ?Patient Measurements: ?Height: 6' 2"  (188 cm) ?Weight: (!) 171.6 kg (378 lb 5 oz) ?IBW/kg (Calculated) : 82.2 ?Heparin Dosing Weight: 122 kg ? ?Vital Signs: ?Temp: 97.3 ?F (36.3 ?C) (04/14 1400) ?Temp Source: Core (04/14 0700) ?BP: 98/64 (04/14 1400) ?Pulse Rate: 171 (04/14 1400) ? ?Labs: ?Recent Labs  ?  05/26/21 ?1813 05/26/21 ?1918 05/28/21 ?4098 05/28/21 ?1191 05/28/21 ?1855 05/28/21 ?1916 05/28/21 ?2243 05/29/21 ?4782 05/29/21 ?1352  ?HGB  --    < >  --  10.7*   < > 12.9* 10.9* 11.0*  --   ?HCT  --    < >  --  33.2*   < > 38.0* 33.6* 33.2*  --   ?PLT  --    < >  --  256  --   --  246 259  --   ?APTT  --   --   --   --   --   --   --  37* 39*  ?HEPARINUNFRC  --   --   --   --   --   --   --  >1.10*  --   ?CREATININE 1.53*   < > 1.82*  --   --   --  2.44* 2.36*  --   ?TROPONINIHS 100*  --   --  103*  --   --   --   --   --   ? < > = values in this interval not displayed.  ? ? ? ?Estimated Creatinine Clearance: 72.2 mL/min (A) (by C-G formula based on SCr of 2.36 mg/dL (H)). ? ? ?Medical History: ?Past Medical History:  ?Diagnosis Date  ? Acute exacerbation of CHF (congestive heart failure) ([place]) 04/05/2021  ? Acute on chronic heart failure ([place]) 10/28/2020  ? Acute pain of left foot 05/03/2017  ? Asthma   ? Asthma, chronic, mild persistent, uncomplicated 95/62/1308  ? Charcot ankle, left 05/31/2017  ? CHF (congestive heart failure) ([place])   ? Chronic systolic (congestive) heart failure (HCC)   ? Diabetes mellitus ([place])   ? Diabetes mellitus type 2 in obese ([place]) 04/21/2014  ? Diabetic polyneuropathy associated with type 2 diabetes mellitus ([place]) 03/14/2017  ? Essential hypertension 10/26/2016  ? Hypertension   ? Obesity   ? Obesity, Class III, BMI 40-49.9 (morbid obesity) ([place]) 10/26/2016  ? OSA (obstructive sleep apnea) 12/02/2016  ? Type 2 diabetes mellitus  with diabetic neuropathy (HCC)   ? ? ?Medications:  ?Medications Prior to Admission  ?Medication Sig Dispense Refill Last Dose  ? albuterol (VENTOLIN HFA) 108 (90 Base) MCG/ACT inhaler Inhale 2 puffs into the lungs every 4 (four) hours as needed for wheezing or shortness of breath. 18 g 0 Past Week  ? amiodarone (PACERONE) 200 MG tablet TAKE 1/2 TABLET BY MOUTH DAILY (Patient taking differently: Take 100 mg by mouth daily.) 7 tablet 6 05/26/2021  ? apixaban (ELIQUIS) 5 MG TABS tablet Take 1 tablet (5 mg total) by mouth 2 (two) times daily. 60 tablet 2 05/26/2021 at 0800  ? azelastine (ASTELIN) 0.1 % nasal spray Place into both nostrils as needed for rhinitis. Use in each nostril as directed   05/26/2021  ? BIDIL 20-37.5 MG tablet TAKE 1 TABLET BY MOUTH THREE TIMES A DAY (Patient taking differently: Take 1 tablet by mouth 3 (three) times  daily.) 45 tablet 5 05/26/2021  ? [facility] SENNA 8.6 MG tablet Take 1 tablet by mouth daily as needed for constipation.   Past Week  ? digoxin (LANOXIN) 0.125 MG tablet TAKE 1 TABLET BY MOUTH EVERY DAY (Patient taking differently: Take 0.125 mg by mouth daily.) 90 tablet 3 05/26/2021  ? Dulaglutide (TRULICITY) 1.5 WY/6.3ZC SOPN Inject 1 mg into the skin once a week. Patient takes every Monday.   05/25/2021  ? ENTRESTO 24-26 MG TAKE 1 TABLET BY MOUTH TWICE A DAY 60 tablet 3 05/26/2021  ? gabapentin (NEURONTIN) 300 MG capsule Take 300 mg by mouth as needed (pain).   Past Month  ? hyoscyamine (LEVSIN SL) 0.125 MG SL tablet Place 0.125 mg under the tongue every 4 (four) hours as needed for cramping (secretions).   Past Week  ? insulin aspart (NOVOLOG) 100 UNIT/ML injection Inject 20 Units into the skin 3 (three) times daily with meals. 20 mL 11 05/25/2021  ? insulin glargine (LANTUS) 100 UNIT/ML injection Inject 0.3 mLs (30 Units total) into the skin 2 (two) times daily. 20 mL 11 05/25/2021  ? loratadine (CLARITIN) 10 MG tablet Take 1 tablet (10 mg total) by mouth 2 (two) times daily as needed for  allergies (Can use an extra dose during flare ups.). 60 tablet 5 Past Week  ? metolazone (ZAROXOLYN) 2.5 MG tablet Take 1 tablet (2.5 mg total) by mouth once a week. On Thursdays 5 tablet 3 05/21/2021  ? nitroGLYCERIN (NITROSTAT) 0.3 MG SL tablet Place 0.3 mg under the tongue every 5 (five) minutes as needed for chest pain.   unknown  ? ondansetron (ZOFRAN) 4 MG tablet Take 4 mg by mouth daily as needed for nausea or vomiting.   05/26/2021  ? pantoprazole (PROTONIX) 40 MG tablet Take 1 tablet (40 mg total) by mouth daily. 30 tablet 1 05/26/2021  ? potassium chloride SA (KLOR-CON M) 20 MEQ tablet Take 2 tablets (40 mEq total) by mouth 2 (two) times daily. Take extra 40 meq (2 tabs) every Thursday when you take Metolazone 130 tablet 3 05/26/2021  ? spironolactone (ALDACTONE) 25 MG tablet Take 1 tablet (25 mg total) by mouth daily. 30 tablet 0 05/26/2021  ? torsemide (DEMADEX) 20 MG tablet Take 4 tablets (80 mg total) by mouth 2 (two) times daily. 240 tablet 0 05/26/2021  ? traMADol (ULTRAM) 50 MG tablet TAKE 1 TABLET BY MOUTH EVERY 6 HOURS AS NEEDED. (Patient taking differently: Take 50 mg by mouth every 6 (six) hours as needed for moderate pain.) 20 tablet 0 Past Week  ? Accu-Chek Softclix Lancets lancets Check blood sugar 3x a day 100 each 11   ? blood glucose meter kit and supplies KIT Dispense based on patient and insurance preference. Use up to four times daily as directed. (FOR ICD-9 250.00, 250.01). For QAC - HS accuchecks. May switch to any brand. 1 each 1   ? Continuous Blood Gluc Sensor (FREESTYLE LIBRE 2 SENSOR) MISC Use to check blood sugar at least 6 times a day 2 each 11   ? glucose blood (ACCU-CHEK GUIDE) test strip Check blood sugar 3 times per day 100 each 11   ? Insulin Syringe-Needle U-100 30G X 1/2" 1 ML MISC Use to inject insulin 4 times a day. 100 each 2   ? tirzepatide (MOUNJARO) 2.5 MG/0.5ML Pen INJECT 2.5 MG INTO THE SKIN ONCE A WEEK FOR 28 DAYS, THEN 5 MG ONCE A WEEK. 2 mL 1   ? ? ?Assessment: ?11  yoM  with HFrEF admitted with cardiogenic shock. Pt s/p IABP placement 4/13 and DCCV for rapid AF 4/14. Pt is on apixaban at home (last dose 4/13 ~1200) - transitioned to IV heparin. ? ?Repeat aPTT remains subtherapeutic at 39 seconds. ? ?Goal of Therapy:  ?Heparin level 0.3-0.5 units/ml ?aPTT 66-85 sec ?Monitor platelets by anticoagulation protocol: Yes ?  ?Plan:  ?Increase heparin to 1750 units/h ?Repeat aPTT in 8h ? ?Arrie Senate, PharmD, BCPS, BCCP ?Clinical Pharmacist ?[phone] ?Please check AMION for all [place] numbers ?05/29/2021 ? ?

## 2021-05-29 NOTE — Progress Notes (Addendum)
? ? Advanced Heart Failure Rounding Note ? ?PCP-Cardiologist: Fransico Him, MD  ? ?Subjective:   ? ?04/11: Admitted with volume overload. On home milrinone 0.375 mcg. Started on lasix drip.  ?04/12: Milrinone increased to 0.5, NE added. IABP and SWAN placed d/t ongoing shock. ? ?Co-ox 62% on 0.5 milrinone + 15 NE. CVP > 30. ? ?IABP at 1:1 ? ?On lasix gtt at 20/hr + 2.5 mg metolazone. 1L UOP charted yesterday. Net + 1.4L last 24 hrs. Urology placed foley overnight. ? ?Scr 1.67>1.82>2.44>2.36. Na 122, 125 when corrected for hyperglycemia. ? ?Digoxin level 1.5, now off. ? ?Notes abdominal pain, feels like he needs to have a bowel movement. ? ? ? ?Objective:   ?Weight Range: ?(!) 171.6 kg ?Body mass index is 48.57 kg/m?.  ? ?Vital Signs:   ?Temp:  [96.8 ?F (36 ?C)-97.9 ?F (36.6 ?C)] 97.2 ?F (36.2 ?C) (04/14 0703) ?Pulse Rate:  [98-128] 107 (04/14 0630) ?Resp:  [14-28] 18 (04/14 0703) ?BP: (78-138)/(47-110) 96/66 (04/14 0703) ?SpO2:  [86 %-100 %] 99 % (04/14 0700) ?Arterial Line BP: (92-109)/(56-70) 103/68 (04/14 0703) ?Last BM Date : 05/28/21 ? ?Weight change: ?Filed Weights  ? 05/27/21 1604 05/28/21 0313  ?Weight: (!) 170.4 kg (!) 171.6 kg  ? ? ?Intake/Output:  ? ?Intake/Output Summary (Last 24 hours) at 05/29/2021 0728 ?Last data filed at 05/29/2021 0700 ?Gross per 24 hour  ?Intake 2479.69 ml  ?Output 1070 ml  ?Net 1409.69 ml  ?  ? ? ?Physical Exam  ?CVP >30 ?General:  No distress. Lying in bed. ?HEENT: normal ?Neck: supple. JVP to ear. Carotids 2+ bilat; no bruits.  ?Cor: PMI nondisplaced. Irregular rhythm, tachy. No rubs, gallops or murmurs. ?Lungs: bibasilar rales ?Abdomen: soft, nontender, + distended.  ?Extremities: no cyanosis, clubbing, rash, 3+ edema into thighs ?Neuro: alert & orientedx3, cranial nerves grossly intact. moves all 4 extremities w/o difficulty. Affect pleasant ? ? ? ?Telemetry  ? ?Afib 110s-120s ? ?EKG  ?  ?N/A ? ?Labs  ?  ?CBC ?Recent Labs  ?  05/26/21 ?1918 05/28/21 ?[id number] 05/28/21 ?2243  05/29/21 ?[id number]  ?WBC 8.1   < > 8.7 8.4  ?NEUTROABS 5.8  --   --   --   ?HGB 10.9*   < > 10.9* 11.0*  ?HCT 35.4*   < > 33.6* 33.2*  ?MCV 97.0   < > 92.1 91.2  ?PLT 238   < > 246 259  ? < > = values in this interval not displayed.  ? ?Basic Metabolic Panel ?Recent Labs  ?  05/28/21 ?0953 05/28/21 ?1855 05/28/21 ?2243 05/29/21 ?[id number]  ?NA  --    < > 124* 122*  ?K  --    < > 5.0 4.6  ?CL  --   --  87* 86*  ?CO2  --   --  25 25  ?GLUCOSE  --   --  203* 208*  ?BUN  --   --  60* 60*  ?CREATININE  --   --  2.44* 2.36*  ?CALCIUM  --   --  8.7* 8.7*  ?MG 2.2  --   --  2.4  ? < > = values in this interval not displayed.  ? ?Liver Function Tests ?Recent Labs  ?  05/26/21 ?1813  ?AST 25  ?ALT 23  ?ALKPHOS 81  ?BILITOT 1.0  ?PROT 7.8  ?ALBUMIN 3.4*  ? ?Recent Labs  ?  05/26/21 ?1813  ?LIPASE 22  ? ?Cardiac Enzymes ?No results for input(s): CKTOTAL, CKMB, CKMBINDEX, TROPONINI  in the last 72 hours. ? ?BNP: ?BNP (last 3 results) ?Recent Labs  ?  03/12/21 ?1505 04/05/21 ?0148 05/26/21 ?1813  ?BNP 448.3* 460.5* 342.4*  ? ? ?ProBNP (last 3 results) ?No results for input(s): PROBNP in the last 8760 hours. ? ? ?D-Dimer ?No results for input(s): DDIMER in the last 72 hours. ?Hemoglobin A1C ?Recent Labs  ?  05/28/21 ?0953  ?HGBA1C 8.8*  ? ?Fasting Lipid Panel ?No results for input(s): CHOL, HDL, LDLCALC, TRIG, CHOLHDL, LDLDIRECT in the last 72 hours. ?Thyroid Function Tests ?No results for input(s): TSH, T4TOTAL, T3FREE, THYROIDAB in the last 72 hours. ? ?Invalid input(s): FREET3 ? ?Other results: ? ? ?Imaging  ? ? ?CARDIAC CATHETERIZATION ? ?Result Date: 05/28/2021 ?Findings: On NE 5 and milrinone 0.5 RA = 37 RV = 47/38 PA = 51/47 (49) PCW = 32 Fick cardiac output/index = 3.7/1.3 Themo CO/CI = 4.1/1.4 PVR = 3.5 WU FA sat = 99% PA sat = 40%. 43% Assessment: 1. Severe biventricular HF with cardiogenic shock Plan/Discussion: Continue hemodynamic support. Plan DC/CV of Afl when more stable. Glori Bickers, MD 7:48 PM ? ?DG CHEST PORT 1  VIEW ? ?Result Date: 05/29/2021 ?CLINICAL DATA:  36 year old male with CHF. EXAM: PORTABLE CHEST 1 VIEW COMPARISON:  Portable chest 05/28/2021 and earlier. FINDINGS: Portable AP semi upright view at 0522 hours. Stable right PICC line. Stable right IJ approach Swan-Ganz catheter, tip at the right pulmonary bifurcation. Stable lung volumes and mediastinal contour with cardiomegaly. No pneumothorax, pleural effusion or consolidation. Stable pulmonary vascularity without overt edema. Visualized tracheal air column is within normal limits. No acute osseous abnormality identified. IMPRESSION: Stable vascular catheters and cardiomegaly. Vascular congestion without overt edema. Electronically Signed   By: Genevie Ann M.D.   On: 05/29/2021 06:43  ? ?Port CXR ? ?Result Date: 05/28/2021 ?CLINICAL DATA:  Check central line placement EXAM: PORTABLE CHEST 1 VIEW COMPARISON:  05/26/2021 FINDINGS: Cardiac shadow is enlarged but stable. Intra-aortic balloon pump is noted just below the aortic knob. Swan-Ganz catheter is noted in the right pulmonary artery. Right-sided PICC is noted extending into the proximal superior vena cava. No focal infiltrate or effusion is seen. No bony abnormality is noted. IMPRESSION: Tubes and lines as described. No acute abnormality noted. Electronically Signed   By: Inez Catalina M.D.   On: 05/28/2021 21:22   ? ? ?Medications:   ? ? ?Scheduled Medications: ? Chlorhexidine Gluconate Cloth  6 each Topical Daily  ? insulin aspart  0-20 Units Subcutaneous TID WC  ? insulin aspart  0-5 Units Subcutaneous QHS  ? pantoprazole  40 mg Oral Daily  ? potassium chloride SA  40 mEq Oral BID  ? sodium chloride flush  3 mL Intravenous Q12H  ? ? ?Infusions: ? sodium chloride    ? sodium chloride    ? sodium chloride    ? sodium chloride    ? amiodarone 30 mg/hr (05/29/21 0700)  ? furosemide (LASIX) 200 mg in dextrose 5% 100 mL (2mg /mL) infusion 20 mg/hr (05/29/21 0700)  ? heparin 1,400 Units/hr (05/29/21 0700)  ? milrinone 0.5  mcg/kg/min (05/29/21 0700)  ? norepinephrine (LEVOPHED) Adult infusion 15 mcg/min (05/29/21 0700)  ? ? ?PRN Medications: ?sodium chloride, sodium chloride, acetaminophen, albuterol, ALPRAZolam, alum & mag hydroxide-simeth, azelastine, gabapentin, hyoscyamine, loratadine, nitroGLYCERIN, ondansetron, senna, simethicone, sodium chloride flush, traMADol ? ? ? ?Patient Profile  ? ?[name] is a 36 year old with end stage HFrEF, on home milrinnone, NICM, HTN, asthma, OSA, obesity and DMII.  ?  ?  Admitted with A/C End Stage HFrEF.  ? ?Assessment/Plan  ? ?1. A/C End Stage HFrEF , NICM -->Cardiogenic Shock ?-EF has been down for some time.   Echo 10/2020 EF < 20% and moderate RV dysfunction. He has been on home milrinone 0.375 since 9/22.  Not thought to be candidate for advanced therapies with obesity and noncompliance. Echo in 1/23 reviewed, shows EF < 20%, mild LV dilation, severe RV dysfunction with moderate RV enlargement.  ?-On home milrinone 0.375 mcg. Co-ox as low as 33% on 04/13. Now on milrinone 0.5 + NE 15. IABP placed overnight d/t ongoing shock.  ?- CVP 30. Increase lasix gtt to 30/hr + 5 mg metolazone. Na 125. Give tolvaptan. BMET this afternoon. ?- No BB with low output ?- Off digoxin d/t AKI an elevated level ?- Home spiro, bidil and entresto stopped d/t AKI and shock ?- No SGLT2i due to fungal infections.  ?  ?2. DMII ?-Will need SSI while hospitalized.  ?-Check Hgb A1 C ?  ?3. AKI on CKD Stage IIIb  ?-Creatinine baseline recently 1.1-1.3 with recent AKIs ?-Creatinine 1.5>1.8>2.44>2.36, in setting of cardiogenic shock ?  ?4. PAF--->A Flutter RVR  ?- On amio gtt at 30/hr.  ?- Plan for DCCV later this morning ?- Continue heparin gtt ?  ?5. OSA: Severe AHI 65 ?Does not uses CPAP  ?  ?6. Obesity  ?He was supposed to start [facility] but Medicaid denied it.  ?He has continued on Trulicity.  ?Body mass index is 48.57 kg/m?. ?  ?7. ID ?H/O  2/23 Blood CX Enterococcus faecalis and Staph epidermidis bacteremia. Treated  with IV antibiotics. ? ?8. Abdominal Pain  ?- Suspect due to low output heart failure.  ?- No leukocytosis. AF. ?- Constipation may be contributing. Give miralax. ? ?9. Acute Hypoxic Respiratory Failure ?-Decomp

## 2021-05-29 NOTE — Progress Notes (Signed)
[facility] 2C06 [facility]) hospitalized hospice patient. ?  ?This is a current [facility] hospice home care patient with a terminal diagnosis of hypertensive heart disease with chronic systolic CHF.  Patient contacted Dr Haroldine Laws with complaints of weight gain of > 20lbs, lower extremity edema and abdomen. Advised by Dr. Haroldine Laws to go to [facility] ED for diuresis. Patient admitted on 05/26/2021 with diagnosis of end stage CHF.  Per Dr. Tomasa Hosteller with [facility] this is a hospice related admission. ?  ?This patient is appropriate for inpatient status due to need for IV medications for symptom management.  ? ?Visited at bedside. Wife not present. Patient sleeping and did not wake to my voice. He was cardioverted earlier and is now in SR.  ? ?VS: 97.5, 91, 18, 96% RA ?I/O: 2479.08/1068 ? ?Abnormal Labs: ?05/29/21 03:31 ?Total hemoglobin: 10.2 (L) ?Carboxyhemoglobin: 1.6 (H) ?Sodium: 122 (L) ?Chloride: 86 (L) ?Glucose: 208 (H) ?BUN: 60 (H) ?Creatinine: 2.36 (H) ?Calcium: 8.7 (L) ?GFR, Estimated: 36 (L) ?05/29/21 03:31 ?RBC: 3.64 (L) ?Hemoglobin: 11.0 (L) ?HCT: 33.2 (L) ?RDW: 16.6 (H) ? ?Diagnostics: ?CFINDINGS: ?Portable AP semi upright view at 0522 hours. Stable right PICC line. ?Stable right IJ approach Swan-Ganz catheter, tip at the right ?pulmonary bifurcation. Stable lung volumes and mediastinal contour ?with cardiomegaly. No pneumothorax, pleural effusion or ?consolidation. Stable pulmonary vascularity without overt edema. ?Visualized tracheal air column is within normal limits. No acute ?osseous abnormality identified. ?  ?IMPRESSION: ?Stable vascular catheters and cardiomegaly. Vascular congestion ?without overt edemaXR ? ?IV/PRN medications: Amiodarone 16.26ml/20mg /hr continuous, Lasix 16ml/30mg /hr continuous, Milrinone 24.20ml/hr Dose: 0.5 mcg/kg/min continuous, Levophed Rate: 4.69-37.5 mL/hr Dose: 5-40 mcg/min titrated GOAL:  MAP of > 65, SBP > 90. Heparin 17.62ml/1750units/hr,  ?Maalox/Mylanta [id number] PRN @ [id number],  Mylicon 80mg  PO PRN @ 0439, 0918, Tylenol 650mg  PRN @ [id number] ? ?Problem list: ?Assessment/Plan  ?  ?1. A/C End Stage HFrEF , NICM -->Cardiogenic Shock ?-EF has been down for some time.   Echo 10/2020 EF < 20% and moderate RV dysfunction. He has been on home milrinone 0.375 since 9/22.  Not thought to be candidate for advanced therapies with obesity and noncompliance. Echo in 1/23 reviewed, shows EF < 20%, mild LV dilation, severe RV dysfunction with moderate RV enlargement.  ?-On home milrinone 0.375 mcg. Co-ox as low as 33% on 04/13. Now on milrinone 0.5 + NE 15. IABP placed overnight d/t ongoing shock.  ?- CVP 30. Increase lasix gtt to 30/hr + 5 mg metolazone. Na 125. Give tolvaptan. BMET this afternoon. ?- No BB with low output ?- Off digoxin d/t AKI an elevated level ?- Home spiro, bidil and entresto stopped d/t AKI and shock ?- No SGLT2i due to fungal infections.  ?  ?2. DMII ?-Will need SSI while hospitalized.  ?-Check Hgb A1 C ?  ?3. AKI on CKD Stage IIIb  ?-Creatinine baseline recently 1.1-1.3 with recent AKIs ?-Creatinine 1.5>1.8>2.44>2.36, in setting of cardiogenic shock ?  ?4. PAF--->A Flutter RVR  ?- On amio gtt at 30/hr.  ?- Plan for DCCV later this morning ?- Continue heparin gtt ?  ?5. OSA: Severe AHI 65 ?Does not uses CPAP  ?  ?6. Obesity  ?He was supposed to start [facility] but Medicaid denied it.  ?He has continued on Trulicity.  ?Body mass index is 48.57 kg/m?. ?  ?7. ID ?H/O  2/23 Blood CX Enterococcus faecalis and Staph epidermidis bacteremia. Treated with IV antibiotics. ?  ?8. Abdominal Pain  ?- Suspect due to low output heart failure.  ?-  No leukocytosis. AF. ?- Constipation may be contributing. Give miralax. ?  ?9. Acute Hypoxic Respiratory Failure ?-Decompensated with sats in 70s.  ?-O2 sats now stable on 4L [place] ?  ?10. Hyponatremia ?- Na 125 when corrected for hyperglycemia ?- Give tolvaptan 15 ?- BMET this afternoon ?  ?11. GOC  ?-Active with [place] ?-Patient wants to be full code at  this time. Discussed this admit. ?  ? Discharge planning: return home with hospice when stable for discharge ?Family Contact: spoke to wife on phone ?IDG: updated ?GOC: on going. Remains a full code.  ?  ?Should patient need ambulance transport at discharge please use [facility] as [facility] contracts this service with them for our active hospice patients. ?  ?A Please do not hesitate to call with questions.   ?Thank you,   ?Farrel Gordon, RN, CCM      ?[facility] Hospital Liaison   ?[phone] ?

## 2021-05-29 NOTE — Transfer of Care (Signed)
Immediate Anesthesia Transfer of Care Note ? ?Patient: Tyrian Peart ? ?Procedure(s) Performed: CARDIOVERSION ? ?Patient Location: SICU ? ?Anesthesia Type:General ? ?Level of Consciousness: drowsy ? ?Airway & Oxygen Therapy: Patient Spontanous Breathing and Patient connected to nasal cannula oxygen ? ?Post-op Assessment: Report given to RN and Post -op Vital signs reviewed and stable ? ?Post vital signs: Reviewed and stable ? ?Last Vitals:  ?Vitals Value Taken Time  ?BP 94/52 05/29/21 1120  ?Temp 36.3 ?C 05/29/21 1132  ?Pulse 178 05/29/21 1132  ?Resp 21 05/29/21 1132  ?SpO2 93 % 05/29/21 1132  ?Vitals shown include unvalidated device data. ? ?Last Pain:  ?Vitals:  ? 05/29/21 0700  ?TempSrc: Core  ?PainSc: Asleep  ?   ? ?  ? ?Complications: No notable events documented. ?

## 2021-05-29 NOTE — Progress Notes (Addendum)
ANTICOAGULATION CONSULT NOTE - Follow Up Consult ? ?Pharmacy Consult for heparin ?Indication:  IABP and Afib ? ?Labs: ?Recent Labs  ?  05/28/21 ?0953 05/28/21 ?1855 05/28/21 ?1916 05/28/21 ?2243 05/29/21 ?9562 05/29/21 ?1352 05/29/21 ?2250  ?HGB 10.7*   < > 12.9* 10.9* 11.0*  --   --   ?HCT 33.2*   < > 38.0* 33.6* 33.2*  --   --   ?PLT 256  --   --  246 259  --   --   ?APTT  --   --   --   --  37* 39* 57*  ?HEPARINUNFRC  --   --   --   --  >1.10*  --   --   ?CREATININE  --   --   --  2.44* 2.36* 2.22*  --   ?TROPONINIHS 103*  --   --   --   --   --   --   ? < > = values in this interval not displayed.  ? ? ? ?Assessment: ?36yo male subtherapeutic on heparin after rate change; no infusion issues or signs of bleeding per RN. ? ?Goal of Therapy:  ?aPTT 66-85 seconds ?  ?Plan:  ?Will increase heparin infusion by 2 units/kgABW/hr to 2000 units/hr and check PTT in 6 hours.   ? ?Vernard Gambles, PharmD, BCPS  ?05/29/2021,11:37 PM ? ?Addendum: ?PTT this am remains low with no change after rate adjustments.  Will increase heparin gtt by 2 units/kgABW/hr to 2300 units/hr and check PTT in 6 hours.  VB 05/30/2021 5:52 AM  ? ?

## 2021-05-29 NOTE — CV Procedure (Signed)
? ?   DIRECT CURRENT CARDIOVERSION ? ?NAME:  Jonathan [name]   MRN: [id number] ?DOB:  February 28, 1985   ADMIT DATE: 05/26/2021 ? ? ?INDICATIONS: Atrial flutter ? ? ?PROCEDURE:  ? ?Informed consent was obtained prior to the procedure. The risks, benefits and alternatives for the procedure were discussed and the patient comprehended these risks. Once an appropriate time out was taken, the patient had the defibrillator pads placed in the anterior and posterior position. The patient then underwent sedation by the anesthesia service. Once an appropriate level of sedation was achieved, the patient received a single biphasic, synchronized 200J shock with prompt conversion to sinus rhythm. No apparent complications. ? ?Arvilla Meres, MD  ?3:28 PM ? ?

## 2021-05-29 NOTE — Progress Notes (Signed)
ANTICOAGULATION CONSULT NOTE - Follow Up Consult ? ?Pharmacy Consult for heparin ?Indication:  IABP and Afib ? ?Labs: ?Recent Labs  ?  05/26/21 ?1813 05/26/21 ?1918 05/28/21 ?[id number] 05/28/21 ?[id number] 05/28/21 ?1855 05/28/21 ?1916 05/28/21 ?2243 05/29/21 ?[id number]  ?HGB  --    < >  --  10.7*   < > 12.9* 10.9* 11.0*  ?HCT  --    < >  --  33.2*   < > 38.0* 33.6* 33.2*  ?PLT  --    < >  --  256  --   --  246 259  ?APTT  --   --   --   --   --   --   --  37*  ?HEPARINUNFRC  --   --   --   --   --   --   --  >1.10*  ?CREATININE 1.53*   < > 1.82*  --   --   --  2.44* 2.36*  ?TROPONINIHS 100*  --   --  103*  --   --   --   --   ? < > = values in this interval not displayed.  ? ? ?Assessment: ?36yo male subtherapeutic on heparin with initial dosing while Eliquis is on hold and IABP placed; no infusion issues or signs of bleeding per RN.  Of note heparin infusion was ordered at 1100 units/hr instead of planned 1300 units/hr. ? ?Goal of Therapy:  ?aPTT 66-85 seconds ?  ?Plan:  ?Will increase heparin infusion by 2-3 units/kg/hr to 1400 units/hr and check PTT in 6 hours.   ? ?Wynona Neat, PharmD, BCPS  ?05/29/2021,5:46 AM ? ? ?

## 2021-05-29 NOTE — Anesthesia Postprocedure Evaluation (Signed)
Anesthesia Post Note ? ?Patient: Jonathan [name] ? ?Procedure(s) Performed: CARDIOVERSION ? ?  ? ?Patient location during evaluation: PACU ?Anesthesia Type: General ?Level of consciousness: awake and alert, oriented and patient cooperative ?Pain management: pain level controlled ?Vital Signs Assessment: post-procedure vital signs reviewed and stable ?Respiratory status: spontaneous breathing, nonlabored ventilation and respiratory function stable ?Cardiovascular status: blood pressure returned to baseline and stable ?Postop Assessment: no apparent nausea or vomiting ?Anesthetic complications: no ? ? ?No notable events documented. ? ?Last Vitals:  ?Vitals:  ? 05/29/21 1045 05/29/21 1142  ?BP:    ?Pulse: (!) 107 92  ?Resp: 19 20  ?Temp: 36.5 ?C   ?SpO2: 96% 98%  ?  ?Last Pain:  ?Vitals:  ? 05/29/21 0700  ?TempSrc: Core  ?PainSc: Asleep  ? ? ?  ?  ?  ?  ?  ?  ? ?Tennis Must Refoel Palladino ? ? ? ? ?

## 2021-05-30 ENCOUNTER — Inpatient Hospital Stay (HOSPITAL_COMMUNITY): Payer: Medicaid Other

## 2021-05-30 DIAGNOSIS — I5022 Chronic systolic (congestive) heart failure: Secondary | ICD-10-CM | POA: Diagnosis not present

## 2021-05-30 DIAGNOSIS — E1142 Type 2 diabetes mellitus with diabetic polyneuropathy: Secondary | ICD-10-CM | POA: Diagnosis not present

## 2021-05-30 DIAGNOSIS — Z9981 Dependence on supplemental oxygen: Secondary | ICD-10-CM | POA: Diagnosis not present

## 2021-05-30 DIAGNOSIS — J302 Other seasonal allergic rhinitis: Secondary | ICD-10-CM | POA: Diagnosis not present

## 2021-05-30 DIAGNOSIS — J45909 Unspecified asthma, uncomplicated: Secondary | ICD-10-CM | POA: Diagnosis not present

## 2021-05-30 DIAGNOSIS — Z794 Long term (current) use of insulin: Secondary | ICD-10-CM | POA: Diagnosis not present

## 2021-05-30 DIAGNOSIS — F419 Anxiety disorder, unspecified: Secondary | ICD-10-CM | POA: Diagnosis not present

## 2021-05-30 DIAGNOSIS — J9691 Respiratory failure, unspecified with hypoxia: Secondary | ICD-10-CM | POA: Diagnosis not present

## 2021-05-30 DIAGNOSIS — I5084 End stage heart failure: Secondary | ICD-10-CM | POA: Diagnosis not present

## 2021-05-30 DIAGNOSIS — G4733 Obstructive sleep apnea (adult) (pediatric): Secondary | ICD-10-CM | POA: Diagnosis not present

## 2021-05-30 DIAGNOSIS — T80212D Local infection due to central venous catheter, subsequent encounter: Secondary | ICD-10-CM | POA: Diagnosis not present

## 2021-05-30 DIAGNOSIS — E669 Obesity, unspecified: Secondary | ICD-10-CM | POA: Diagnosis not present

## 2021-05-30 DIAGNOSIS — M14672 Charcot's joint, left ankle and foot: Secondary | ICD-10-CM | POA: Diagnosis not present

## 2021-05-30 DIAGNOSIS — M545 Low back pain, unspecified: Secondary | ICD-10-CM | POA: Diagnosis not present

## 2021-05-30 DIAGNOSIS — F32A Depression, unspecified: Secondary | ICD-10-CM | POA: Diagnosis not present

## 2021-05-30 DIAGNOSIS — J811 Chronic pulmonary edema: Secondary | ICD-10-CM | POA: Diagnosis not present

## 2021-05-30 DIAGNOSIS — I517 Cardiomegaly: Secondary | ICD-10-CM | POA: Diagnosis not present

## 2021-05-30 DIAGNOSIS — I25119 Atherosclerotic heart disease of native coronary artery with unspecified angina pectoris: Secondary | ICD-10-CM | POA: Diagnosis not present

## 2021-05-30 DIAGNOSIS — I4892 Unspecified atrial flutter: Secondary | ICD-10-CM | POA: Diagnosis not present

## 2021-05-30 DIAGNOSIS — L304 Erythema intertrigo: Secondary | ICD-10-CM | POA: Diagnosis not present

## 2021-05-30 DIAGNOSIS — R570 Cardiogenic shock: Secondary | ICD-10-CM | POA: Diagnosis not present

## 2021-05-30 DIAGNOSIS — I509 Heart failure, unspecified: Secondary | ICD-10-CM | POA: Diagnosis not present

## 2021-05-30 DIAGNOSIS — I130 Hypertensive heart and chronic kidney disease with heart failure and stage 1 through stage 4 chronic kidney disease, or unspecified chronic kidney disease: Secondary | ICD-10-CM | POA: Diagnosis not present

## 2021-05-30 LAB — BASIC METABOLIC PANEL
Anion gap: 12 (ref 5–15)
Anion gap: 10 (ref 5–15)
BUN: 60 mg/dL — ABNORMAL HIGH (ref 6–20)
BUN: 60 mg/dL — ABNORMAL HIGH (ref 6–20)
CO2: 26 mmol/L (ref 22–32)
CO2: 28 mmol/L (ref 22–32)
Calcium: 8.7 mg/dL — ABNORMAL LOW (ref 8.9–10.3)
Calcium: 8.9 mg/dL (ref 8.9–10.3)
Chloride: 86 mmol/L — ABNORMAL LOW (ref 98–111)
Chloride: 86 mmol/L — ABNORMAL LOW (ref 98–111)
Creatinine, Ser: 2.11 mg/dL — ABNORMAL HIGH (ref 0.61–1.24)
Creatinine, Ser: 1.98 mg/dL — ABNORMAL HIGH (ref 0.61–1.24)
GFR, Estimated: 41 mL/min — ABNORMAL LOW (ref >60)
GFR, Estimated: 44 mL/min — ABNORMAL LOW (ref >60)
Glucose, Bld: 227 mg/dL — ABNORMAL HIGH (ref 70–99)
Glucose, Bld: 242 mg/dL — ABNORMAL HIGH (ref 70–99)
Potassium: 3.7 mmol/L (ref 3.5–5.1)
Potassium: 3.6 mmol/L (ref 3.5–5.1)
Sodium: 124 mmol/L — ABNORMAL LOW (ref 135–145)
Sodium: 124 mmol/L — ABNORMAL LOW (ref 135–145)

## 2021-05-30 LAB — CBC
HCT: 34.6 % — ABNORMAL LOW (ref 39.0–52.0)
Hemoglobin: 11.3 g/dL — ABNORMAL LOW (ref 13.0–17.0)
MCH: 30 pg (ref 26.0–34.0)
MCHC: 32.7 g/dL (ref 30.0–36.0)
MCV: 91.8 fL (ref 80.0–100.0)
Platelets: 214 10*3/uL (ref 150–400)
RBC: 3.77 MIL/uL — ABNORMAL LOW (ref 4.22–5.81)
RDW: 16.8 % — ABNORMAL HIGH (ref 11.5–15.5)
WBC: 10.1 10*3/uL (ref 4.0–10.5)
nRBC: 0 % (ref 0.0–0.2)

## 2021-05-30 LAB — COOXEMETRY PANEL
Carboxyhemoglobin: 1 % (ref 0.5–1.5)
Carboxyhemoglobin: 1.1 % (ref 0.5–1.5)
Methemoglobin: <0.7 % (ref 0.0–1.5)
Methemoglobin: <0.7 % (ref 0.0–1.5)
O2 Saturation: 55.1 %
O2 Saturation: 40.2 %
Total hemoglobin: 11.9 g/dL — ABNORMAL LOW (ref 12.0–16.0)
Total hemoglobin: 11.7 g/dL — ABNORMAL LOW (ref 12.0–16.0)

## 2021-05-30 LAB — GLUCOSE, CAPILLARY
Glucose-Capillary: 227 mg/dL — ABNORMAL HIGH (ref 70–99)
Glucose-Capillary: 229 mg/dL — ABNORMAL HIGH (ref 70–99)
Glucose-Capillary: 271 mg/dL — ABNORMAL HIGH (ref 70–99)
Glucose-Capillary: 328 mg/dL — ABNORMAL HIGH (ref 70–99)

## 2021-05-30 LAB — HEPARIN LEVEL (UNFRACTIONATED): Heparin Unfractionated: >1.1 IU/mL — ABNORMAL HIGH (ref 0.30–0.70)

## 2021-05-30 LAB — APTT
aPTT: 56 seconds — ABNORMAL HIGH (ref 24–36)
aPTT: 97 seconds — ABNORMAL HIGH (ref 24–36)
aPTT: 67 seconds — ABNORMAL HIGH (ref 24–36)

## 2021-05-30 MED ORDER — POTASSIUM CHLORIDE CRYS ER 10 MEQ PO TBCR
30.0000 meq | EXTENDED_RELEASE_TABLET | Freq: Once | ORAL | Status: AC
  Administered 2021-05-30: 30 meq via ORAL
  Filled 2021-05-30: qty 1

## 2021-05-30 MED ORDER — POTASSIUM CHLORIDE CRYS ER 20 MEQ PO TBCR: 40.0000 meq | EXTENDED_RELEASE_TABLET | Freq: Once | ORAL | Status: DC

## 2021-05-30 MED ORDER — INSULIN ASPART 100 UNIT/ML IJ SOLN
2.0000 [IU] | Freq: Three times a day (TID) | INTRAMUSCULAR | Status: DC
  Administered 2021-05-30 – 2021-06-04 (×16): 2 [IU] via SUBCUTANEOUS

## 2021-05-30 MED ORDER — SENNOSIDES-DOCUSATE SODIUM 8.6-50 MG PO TABS
2.0000 | ORAL_TABLET | Freq: Every day | ORAL | Status: DC
  Administered 2021-05-30: 2 via ORAL
  Filled 2021-05-30: qty 2

## 2021-05-30 MED ORDER — INSULIN ASPART 100 UNIT/ML IJ SOLN
0.0000 [IU] | Freq: Every day | INTRAMUSCULAR | Status: DC
  Administered 2021-05-30: 4 [IU] via SUBCUTANEOUS
  Administered 2021-05-31 – 2021-06-01 (×2): 3 [IU] via SUBCUTANEOUS
  Administered 2021-06-03: 2 [IU] via SUBCUTANEOUS

## 2021-05-30 MED ORDER — INSULIN ASPART 100 UNIT/ML IJ SOLN
0.0000 [IU] | Freq: Three times a day (TID) | INTRAMUSCULAR | Status: DC
  Administered 2021-05-30: 11 [IU] via SUBCUTANEOUS
  Administered 2021-05-30 – 2021-05-31 (×2): 7 [IU] via SUBCUTANEOUS
  Administered 2021-05-31 (×2): 15 [IU] via SUBCUTANEOUS
  Administered 2021-06-01: 7 [IU] via SUBCUTANEOUS
  Administered 2021-06-01: 11 [IU] via SUBCUTANEOUS
  Administered 2021-06-02: 4 [IU] via SUBCUTANEOUS
  Administered 2021-06-02 (×2): 3 [IU] via SUBCUTANEOUS
  Administered 2021-06-03 (×2): 7 [IU] via SUBCUTANEOUS
  Administered 2021-06-03: 3 [IU] via SUBCUTANEOUS
  Administered 2021-06-04: 7 [IU] via SUBCUTANEOUS
  Administered 2021-06-04 (×2): 4 [IU] via SUBCUTANEOUS

## 2021-05-30 MED ORDER — INSULIN DETEMIR 100 UNIT/ML ~~LOC~~ SOLN
10.0000 [IU] | Freq: Every day | SUBCUTANEOUS | Status: DC
  Administered 2021-05-30: 10 [IU] via SUBCUTANEOUS
  Filled 2021-05-30 (×2): qty 0.1

## 2021-05-30 MED ORDER — POTASSIUM CHLORIDE CRYS ER 20 MEQ PO TBCR
60.0000 meq | EXTENDED_RELEASE_TABLET | Freq: Once | ORAL | Status: AC
  Administered 2021-05-30: 60 meq via ORAL
  Filled 2021-05-30: qty 3

## 2021-05-30 NOTE — Progress Notes (Signed)
? ? Advanced Heart Failure Rounding Note ? ?PCP-Cardiologist: Fransico Him, MD  ? ?Subjective:   ? ?04/11: Admitted with volume overload. On home milrinone 0.375 mcg. Started on lasix drip.  ?04/13: Milrinone increased to 0.5, NE added. IABP and SWAN placed d/t ongoing shock. ?04/14: DC-CV ? ?Remains in NSR. On IV amio. On IABP 1:1, NE 14 and milrinone 0.5 Co-ox 55%. Repeat 40% ? ?Excellent diuresis ~ 7L out overnight. Peeing 500/hr ? ?Feels weak. SOB. Back sore.  ? ?PAP: (40-73)/(23-52) 57/39 ?CVP:  [25 mmHg-35 mmHg] 30 mmHg ?PCWP:  [27 mmHg-32 mmHg] 32 mmHg ?CO:  [4.4 L/min-5 L/min] 5 L/min ?CI:  [1.5 L/min/m2-1.8 L/min/m2] 1.8 L/min/m2 ? ?Objective:   ?Weight Range: ?(!) 171 kg ?Body mass index is 48.4 kg/m?.  ? ?Vital Signs:   ?Temp:  [96.8 ?F (36 ?C)-98.6 ?F (37 ?C)] 97.3 ?F (36.3 ?C) (04/15 1015) ?Pulse Rate:  [87-195] 183 (04/15 1015) ?Resp:  [13-25] 17 (04/15 1015) ?BP: (80-120)/(13-103) 111/75 (04/15 1000) ?SpO2:  [89 %-100 %] 89 % (04/15 1015) ?Arterial Line BP: (98-167)/(68-146) 113/105 (04/15 1015) ?Weight:  [171 kg] 171 kg (04/15 0630) ?Last BM Date : 05/26/21 ? ?Weight change: ?Filed Weights  ? 05/27/21 1604 05/28/21 0313 05/30/21 0630  ?Weight: (!) 170.4 kg (!) 171.6 kg (!) 171 kg  ? ? ?Intake/Output:  ? ?Intake/Output Summary (Last 24 hours) at 05/30/2021 1106 ?Last data filed at 05/30/2021 1100 ?Gross per 24 hour  ?Intake 3806.09 ml  ?Output 8250 ml  ?Net -4443.91 ml  ? ?  ? ? ?Physical Exam  ? ?General:  Obese male lying in bed  No resp difficulty ?HEENT: normal ?Neck: supple. RIJ swan Carotids 2+ bilat; no bruits. No lymphadenopathy or thryomegaly appreciated. ?Cor:  Regular rate & rhythm. 2/6 TR +s3 ?Lungs: clear ?Abdomen: obese soft, nontender, nondistended. No hepatosplenomegaly. No bruits or masses. Good bowel sounds. ?Extremities: no cyanosis, clubbing, rash, 2+ edema  RFA IABP ?Neuro: alert & orientedx3, cranial nerves grossly intact. moves all 4 extremities w/o difficulty. Affect  pleasant ? ? ?Telemetry  ? ?Sinus 80-90s Personally reviewed ? ?Labs  ?  ?CBC ?Recent Labs  ?  05/29/21 ?[id number] 05/30/21 ?0510  ?WBC 8.4 10.1  ?HGB 11.0* 11.3*  ?HCT 33.2* 34.6*  ?MCV 91.2 91.8  ?PLT 259 214  ? ? ?Basic Metabolic Panel ?Recent Labs  ?  05/28/21 ?0953 05/28/21 ?1855 05/29/21 ?[id number] 05/29/21 ?1352 05/30/21 ?0510  ?NA  --    < > 122* 125* 124*  ?K  --    < > 4.6 4.5 3.7  ?CL  --    < > 86* 87* 86*  ?CO2  --    < > 25 26 26   ?GLUCOSE  --    < > 208* 213* 227*  ?BUN  --    < > 60* 59* 60*  ?CREATININE  --    < > 2.36* 2.22* 2.11*  ?CALCIUM  --    < > 8.7* 8.7* 8.7*  ?MG 2.2  --  2.4  --   --   ? < > = values in this interval not displayed.  ? ? ?Liver Function Tests ?Recent Labs  ?  05/29/21 ?1352  ?AST 28  ?ALT 23  ?ALKPHOS 86  ?BILITOT 1.3*  ?PROT 7.5  ?ALBUMIN 3.2*  ? ? ?No results for input(s): LIPASE, AMYLASE in the last 72 hours. ? ?Cardiac Enzymes ?No results for input(s): CKTOTAL, CKMB, CKMBINDEX, TROPONINI in the last 72 hours. ? ?BNP: ?BNP (last 3 results) ?  Recent Labs  ?  03/12/21 ?1505 04/05/21 ?0148 05/26/21 ?1813  ?BNP 448.3* 460.5* 342.4*  ? ? ? ?ProBNP (last 3 results) ?No results for input(s): PROBNP in the last 8760 hours. ? ? ?D-Dimer ?No results for input(s): DDIMER in the last 72 hours. ?Hemoglobin A1C ?Recent Labs  ?  05/28/21 ?0953  ?HGBA1C 8.8*  ? ? ?Fasting Lipid Panel ?No results for input(s): CHOL, HDL, LDLCALC, TRIG, CHOLHDL, LDLDIRECT in the last 72 hours. ?Thyroid Function Tests ?No results for input(s): TSH, T4TOTAL, T3FREE, THYROIDAB in the last 72 hours. ? ?Invalid input(s): FREET3 ? ?Other results: ? ? ?Imaging  ? ? ?No results found. ? ? ?Medications:   ? ? ?Scheduled Medications: ? Chlorhexidine Gluconate Cloth  6 each Topical Daily  ? insulin aspart  0-20 Units Subcutaneous TID WC  ? insulin aspart  0-5 Units Subcutaneous QHS  ? insulin glargine-yfgn  10 Units Subcutaneous QHS  ? pantoprazole  40 mg Oral Daily  ? polyethylene glycol  17 g Oral Daily  ? sodium chloride  flush  3 mL Intravenous Q12H  ? ? ?Infusions: ? sodium chloride    ? sodium chloride    ? sodium chloride    ? sodium chloride    ? amiodarone 30 mg/hr (05/30/21 1100)  ? furosemide (LASIX) 200 mg in dextrose 5% 100 mL (2mg /mL) infusion 30 mg/hr (05/30/21 1100)  ? heparin 2,300 Units/hr (05/30/21 1100)  ? milrinone 0.5 mcg/kg/min (05/30/21 1100)  ? norepinephrine (LEVOPHED) Adult infusion 14 mcg/min (05/30/21 1100)  ? ? ?PRN Medications: ?sodium chloride, sodium chloride, acetaminophen, albuterol, ALPRAZolam, alum & mag hydroxide-simeth, azelastine, gabapentin, hyoscyamine, loratadine, nitroGLYCERIN, ondansetron, senna, simethicone, sodium chloride flush, traMADol ? ? ? ?Patient Profile  ? ?Jonathan [name] is a 36 year old with end stage HFrEF, on home milrinnone, NICM, HTN, asthma, OSA, obesity and DMII.  ?  ?Admitted with A/C End Stage HFrEF.  ? ?Assessment/Plan  ? ?1. A/C End Stage HFrEF , NICM -->Cardiogenic Shock ?-EF has been down for some time.   Echo 10/2020 EF < 20% and moderate RV dysfunction. He has been on home milrinone 0.375 since 9/22.  Not thought to be candidate for transplant d/t obesity and noncompliance. No VAD due to severe RV dysfunction (CVP 37 on cath) Echo in 1/23 reviewed, shows EF < 20%, mild LV dilation, severe RV dysfunction with moderate RV enlargement.  ?-On home milrinone 0.375 mcg.  ?- He is critically ill with severe biventricular HF. Has end-stage HF at baseline on home milrinone. He was tipped over by AFL.  ?- Now with IABP, NE 14 milrinone 0.5. Hemodynamics remain tenuous. Increase NE to keep CI >=1.9 ?- Massively overloaded. Diuresing well on lasix gtt. Continue ?- No BB with low output ?- Off digoxin d/t AKI an elevated level ?- Home spiro, bidil and entresto stopped d/t AKI and shock. Add back spiro today ?- No SGLT2i due to fungal infections.  ?  ?2. DMII ?-Continue SSI  ?- A1c 8.8 ?  ?3. AKI on CKD Stage IIIb  ?-Creatinine baseline recently 1.1-1.3 with recent AKIs ?-Creatinine  1.5>1.8>2.44>2.36 -> 2.11, in setting of cardiogenic shock ?- continue hemodynamic support ?  ?4. PAF--->A Flutter RVR  ?- On amio gtt at 30/hr.  ?- s/p DC-CV on 4/14. Remains in NSR  ?- Continue heparin gtt ?  ?5. OSA: Severe AHI 65 ?- CPAP ?  ?6. Morbid obesity  ?- He was supposed to start [facility] but Medicaid denied it.  ?- He has continued  on Trulicity.  ?- Body mass index is 48.4 kg/m?. ?  ?7. Acute Hypoxic Respiratory Failure ?-Decompensated with sats in 70s.  ?-O2 sats now stable on 4L [place] ? ?8. Hyponatremia ?- Na 124 when corrected for hyperglycemia ?- Repeat tolvaptan 15 ? ?9. Hypokalemia ?- supp ? ?10. GOC  ?-Active with [facility] ?-Patient wants to be full code at this time. Discussed this admit. ? ?CRITICAL CARE ?Performed by: Glori Bickers ? ?Total critical care time: 45 minutes ? ?Critical care time was exclusive of separately billable procedures and treating other patients. ? ?Critical care was necessary to treat or prevent imminent or life-threatening deterioration. ? ?Critical care was time spent personally by me (independent of midlevel providers or residents) on the following activities: development of treatment plan with patient and/or surrogate as well as nursing, discussions with consultants, evaluation of patient's response to treatment, examination of patient, obtaining history from patient or surrogate, ordering and performing treatments and interventions, ordering and review of laboratory studies, ordering and review of radiographic studies, pulse oximetry and re-evaluation of patient's condition. ? ? ? ?Length of Stay: 4 ? ?Glori Bickers, MD  ?05/30/2021, 11:06 AM ? ?Advanced Heart Failure Team ?Pager [phone] (M-F; 7a - 5p)  ?Please contact [facility] for night-coverage after hours (5p -7a ) and weekends on [email or web address] ? ? ? ? ? ?

## 2021-05-30 NOTE — Progress Notes (Signed)
[facility]) hospitalized hospice patient. ?  ?This is a current [facility] hospice home care patient with a terminal diagnosis of hypertensive heart disease with chronic systolic CHF.  Patient contacted Dr Haroldine Laws with complaints of weight gain of > 20lbs, lower extremity edema and abdomen. Advised by Dr. Haroldine Laws to go to [facility] ED for diuresis. Patient admitted on 05/26/2021 with diagnosis of end stage CHF.  Per Dr. Tomasa Hosteller with [facility] this is a hospice related admission. ?  ?This patient is appropriate for inpatient status due to need for IV medications for symptom management.  ?  ?Visited at bedside. Both patient and wife asleep and easily arousable. Patient reports to feeling some better today.  ?  ?VS: 97.3/ 118/ 19   111/75   spO2 92% ?I/O: 3806/8250 ?  ?Abnormal Labs: Na+124, Chloride 86, Glucose 227, BUN 60, Creatinine 2.11, Ca+ 8.7, GFR 41, RBC 3.77, Hgb 11.3, HCT 34.6,  ?  ?Diagnostics: ?PORTABLE CHEST 1 VIEW ?COMPARISON:  One-view chest x-ray 09/28/2021  ?FINDINGS: ?Heart is mildly enlarged. Swan-Ganz catheter terminates at the main ?pulmonary outflow tract. Aortic balloon pump is stable. Mild ?pulmonary vascular congestion noted.  ?IMPRESSION: ?Stable cardiomegaly and mild pulmonary vascular congestion.  ?Electronically Signed ?  By: San Morelle M.D. ?  On: 05/30/2021 12:26  ? ?IV/PRN medications: Amiodarone 16.64ml/20mg /hr continuous, Lasix 74ml/30mg /hr continuous, Milrinone 24.57ml/hr Dose: 0.5 mcg/kg/min continuous, Levophed Rate: 4.69-37.5 mL/hr Dose: 5-40 mcg/min titrated GOAL:  MAP of > 65, SBP > 90. Heparin 17.77ml/1750units/hr,  ?  ?Problem list: ?1. A/C End Stage HFrEF , NICM -->Cardiogenic Shock ?-EF has been down for some time.   Echo 10/2020 EF < 20% and moderate RV dysfunction. He has been on home milrinone 0.375 since 9/22.  Not thought to be candidate for transplant d/t obesity and noncompliance. No VAD due to severe RV dysfunction (CVP 37 on cath) Echo in 1/23  reviewed, shows EF < 20%, mild LV dilation, severe RV dysfunction with moderate RV enlargement.  ?-On home milrinone 0.375 mcg.  ?- He is critically ill with severe biventricular HF. Has end-stage HF at baseline on home milrinone. He was tipped over by AFL.  ?- Now with IABP, NE 14 milrinone 0.5. Hemodynamics remain tenuous. Increase NE to keep CI >=1.9 ?- Massively overloaded. Diuresing well on lasix gtt. Continue ?- No BB with low output ?- Off digoxin d/t AKI an elevated level ?- Home spiro, bidil and entresto stopped d/t AKI and shock. Add back spiro today ?- No SGLT2i due to fungal infections.  ?  ?2. DMII ?-Continue SSI  ?- A1c 8.8 ?  ?3. AKI on CKD Stage IIIb  ?-Creatinine baseline recently 1.1-1.3 with recent AKIs ?-Creatinine 1.5>1.8>2.44>2.36 -> 2.11, in setting of cardiogenic shock ?- continue hemodynamic support ?  ?4. PAF--->A Flutter RVR  ?- On amio gtt at 30/hr.  ?- s/p DC-CV on 4/14. Remains in NSR  ?- Continue heparin gtt ?  ?5. OSA: Severe AHI 65 ?- CPAP ?  ?6. Morbid obesity  ?- He was supposed to start [facility] but Medicaid denied it.  ?- He has continued on Trulicity.  ?- Body mass index is 48.4 kg/m?. ?  ?7. Acute Hypoxic Respiratory Failure ?-Decompensated with sats in 70s.  ?-O2 sats now stable on 4L [place] ?  ?8. Hyponatremia ?- Na 124 when corrected for hyperglycemia ?- Repeat tolvaptan 15 ?  ?9. Hypokalemia ?- supp ?  ?10. GOC  ?-Active with [facility] ?-Patient wants to be full code at this  time. Discussed this admit.  ? Discharge planning: return home with hospice when stable for discharge ? ?Family Contact: talked with wife at bedside ?IDG: updated ?GOC: on going. Remains a full code.  ?  ?Should patient need ambulance transport at discharge please use [facility] as [facility] contracts this service with them for our active hospice patients. ?  ?A Please do not hesitate to call with questions.   ?Thank you,   ?Jhonnie Garner, BSN, Therapist, sports, WTA-C ?Hospice hospital liaison ?[phone] ?

## 2021-05-30 NOTE — Progress Notes (Signed)
ANTICOAGULATION CONSULT NOTE - Follow Up Consult ? ?Pharmacy Consult for heparin ?Indication:  IABP and Afib ?Heparin dosing wt: 171 kg ? ?Labs: ?Recent Labs  ?  05/28/21 ?0953 05/28/21 ?1855 05/28/21 ?2243 05/28/21 ?2243 05/29/21 ?[id number] 05/29/21 ?1352 05/29/21 ?2250 05/30/21 ?0510 05/30/21 ?1200 05/30/21 ?2125  ?HGB 10.7*   < > 10.9*  --  11.0*  --   --  11.3*  --   --   ?HCT 33.2*   < > 33.6*  --  33.2*  --   --  34.6*  --   --   ?PLT 256  --  246  --  259  --   --  214  --   --   ?APTT  --   --   --    < > 37* 39*   < > 56* 97* 67*  ?HEPARINUNFRC  --   --   --   --  >1.10*  --   --  >1.10*  --   --   ?CREATININE  --   --  2.44*  --  2.36* 2.22*  --  2.11* 1.98*  --   ?TROPONINIHS 103*  --   --   --   --   --   --   --   --   --   ? < > = values in this interval not displayed.  ? ? ?Assessment: ?36yo male with cardiogenic shock and a fib s/p  IABP. No anticoagulation prior to admission. Pharmacy consulted for heparin.   ? ?aPTT is at low end of therapeutic at 67 seconds on heparin at 2150 units/hr. CBC stable. Will increase slightly to keep in goal. ? ?Goal of Therapy:  ?aPTT 66-85 seconds ?Monitor platelets per anticoagulation protocol: Yes  ?  ?Plan:  ?Increase heparin infusion 2200 units/hr   ?Daily aPTT, HL, and CBC while on heparin ?Monitor for s/x of bleeding ? ? ?Benetta Spar, PharmD, BCPS, BCCP ?Clinical Pharmacist ? ?Please check AMION for all [place] phone numbers ?After 10:00 PM, call [place] [phone] ? ?

## 2021-05-30 NOTE — Progress Notes (Signed)
ANTICOAGULATION CONSULT NOTE - Follow Up Consult ? ?Pharmacy Consult for heparin ?Indication:  IABP and Afib ?Heparin dosing wt: 171 kg ? ?Labs: ?Recent Labs  ?  05/28/21 ?0953 05/28/21 ?1855 05/28/21 ?2243 05/28/21 ?2243 05/29/21 ?[id number] 05/29/21 ?1352 05/29/21 ?2250 05/30/21 ?0510 05/30/21 ?1200  ?HGB 10.7*   < > 10.9*  --  11.0*  --   --  11.3*  --   ?HCT 33.2*   < > 33.6*  --  33.2*  --   --  34.6*  --   ?PLT 256  --  246  --  259  --   --  214  --   ?APTT  --   --   --    < > 37* 39* 57* 56* 97*  ?HEPARINUNFRC  --   --   --   --  >1.10*  --   --  >1.10*  --   ?CREATININE  --   --  2.44*  --  2.36* 2.22*  --  2.11* 1.98*  ?TROPONINIHS 103*  --   --   --   --   --   --   --   --   ? < > = values in this interval not displayed.  ? ? ? ?Assessment: ?36yo male on heparin for a fib and IABP.  ? ?aPTT is supratherapeutic at 97 seconds on heparin at 2300 units/hr. CBC stable. no infusion issues or signs of bleeding per RN. ? ?Goal of Therapy:  ?aPTT 66-85 seconds ?  ?Plan:  ?Will decrease heparin infusion to 2150 units/hr and check PTT in 6 hours.   ?Daily aPTT, HL, and CBC while on heparin ?Monitor for s/x of bleeding ? ?Cathrine Muster, PharmD ?PGY2 Cardiology Pharmacy Resident ?Phone: [phone] ?05/30/2021  3:22 PM ? ?Please check [email or web address] for unit-specific pharmacy phone numbers. ?

## 2021-05-30 NOTE — Progress Notes (Signed)
Pt has home CPAP. Will monotor ?

## 2021-05-30 NOTE — Progress Notes (Signed)
Patient asked to be placed on home CPAP machine.  Placed on CPAP and resting comfortably.  ?

## 2021-05-31 DIAGNOSIS — E1142 Type 2 diabetes mellitus with diabetic polyneuropathy: Secondary | ICD-10-CM | POA: Diagnosis not present

## 2021-05-31 DIAGNOSIS — Z794 Long term (current) use of insulin: Secondary | ICD-10-CM | POA: Diagnosis not present

## 2021-05-31 DIAGNOSIS — T80212D Local infection due to central venous catheter, subsequent encounter: Secondary | ICD-10-CM | POA: Diagnosis not present

## 2021-05-31 DIAGNOSIS — R570 Cardiogenic shock: Secondary | ICD-10-CM | POA: Diagnosis not present

## 2021-05-31 DIAGNOSIS — L304 Erythema intertrigo: Secondary | ICD-10-CM | POA: Diagnosis not present

## 2021-05-31 DIAGNOSIS — I5022 Chronic systolic (congestive) heart failure: Secondary | ICD-10-CM | POA: Diagnosis not present

## 2021-05-31 DIAGNOSIS — J9691 Respiratory failure, unspecified with hypoxia: Secondary | ICD-10-CM | POA: Diagnosis not present

## 2021-05-31 DIAGNOSIS — M14672 Charcot's joint, left ankle and foot: Secondary | ICD-10-CM | POA: Diagnosis not present

## 2021-05-31 DIAGNOSIS — F32A Depression, unspecified: Secondary | ICD-10-CM | POA: Diagnosis not present

## 2021-05-31 DIAGNOSIS — E669 Obesity, unspecified: Secondary | ICD-10-CM | POA: Diagnosis not present

## 2021-05-31 DIAGNOSIS — I5084 End stage heart failure: Secondary | ICD-10-CM | POA: Diagnosis not present

## 2021-05-31 DIAGNOSIS — I130 Hypertensive heart and chronic kidney disease with heart failure and stage 1 through stage 4 chronic kidney disease, or unspecified chronic kidney disease: Secondary | ICD-10-CM | POA: Diagnosis not present

## 2021-05-31 DIAGNOSIS — J302 Other seasonal allergic rhinitis: Secondary | ICD-10-CM | POA: Diagnosis not present

## 2021-05-31 DIAGNOSIS — G4733 Obstructive sleep apnea (adult) (pediatric): Secondary | ICD-10-CM | POA: Diagnosis not present

## 2021-05-31 DIAGNOSIS — J45909 Unspecified asthma, uncomplicated: Secondary | ICD-10-CM | POA: Diagnosis not present

## 2021-05-31 DIAGNOSIS — I25119 Atherosclerotic heart disease of native coronary artery with unspecified angina pectoris: Secondary | ICD-10-CM | POA: Diagnosis not present

## 2021-05-31 DIAGNOSIS — Z9981 Dependence on supplemental oxygen: Secondary | ICD-10-CM | POA: Diagnosis not present

## 2021-05-31 DIAGNOSIS — F419 Anxiety disorder, unspecified: Secondary | ICD-10-CM | POA: Diagnosis not present

## 2021-05-31 DIAGNOSIS — I4892 Unspecified atrial flutter: Secondary | ICD-10-CM | POA: Diagnosis not present

## 2021-05-31 DIAGNOSIS — M545 Low back pain, unspecified: Secondary | ICD-10-CM | POA: Diagnosis not present

## 2021-05-31 LAB — BASIC METABOLIC PANEL
Anion gap: 11 (ref 5–15)
Anion gap: 13 (ref 5–15)
BUN: 59 mg/dL — ABNORMAL HIGH (ref 6–20)
BUN: 56 mg/dL — ABNORMAL HIGH (ref 6–20)
CO2: 29 mmol/L (ref 22–32)
CO2: 29 mmol/L (ref 22–32)
Calcium: 8.7 mg/dL — ABNORMAL LOW (ref 8.9–10.3)
Calcium: 8.9 mg/dL (ref 8.9–10.3)
Chloride: 84 mmol/L — ABNORMAL LOW (ref 98–111)
Chloride: 84 mmol/L — ABNORMAL LOW (ref 98–111)
Creatinine, Ser: 2.18 mg/dL — ABNORMAL HIGH (ref 0.61–1.24)
Creatinine, Ser: 1.9 mg/dL — ABNORMAL HIGH (ref 0.61–1.24)
GFR, Estimated: 39 mL/min — ABNORMAL LOW (ref >60)
GFR, Estimated: 46 mL/min — ABNORMAL LOW (ref >60)
Glucose, Bld: 246 mg/dL — ABNORMAL HIGH (ref 70–99)
Glucose, Bld: 271 mg/dL — ABNORMAL HIGH (ref 70–99)
Potassium: 3.7 mmol/L (ref 3.5–5.1)
Potassium: 3.7 mmol/L (ref 3.5–5.1)
Sodium: 124 mmol/L — ABNORMAL LOW (ref 135–145)
Sodium: 126 mmol/L — ABNORMAL LOW (ref 135–145)

## 2021-05-31 LAB — COOXEMETRY PANEL
Carboxyhemoglobin: 1.3 % (ref 0.5–1.5)
Methemoglobin: <0.7 % (ref 0.0–1.5)
O2 Saturation: 49.1 %
Total hemoglobin: 12 g/dL (ref 12.0–16.0)

## 2021-05-31 LAB — CBC
HCT: 35.3 % — ABNORMAL LOW (ref 39.0–52.0)
Hemoglobin: 11.7 g/dL — ABNORMAL LOW (ref 13.0–17.0)
MCH: 30 pg (ref 26.0–34.0)
MCHC: 33.1 g/dL (ref 30.0–36.0)
MCV: 90.5 fL (ref 80.0–100.0)
Platelets: 202 10*3/uL (ref 150–400)
RBC: 3.9 MIL/uL — ABNORMAL LOW (ref 4.22–5.81)
RDW: 16.4 % — ABNORMAL HIGH (ref 11.5–15.5)
WBC: 11.7 10*3/uL — ABNORMAL HIGH (ref 4.0–10.5)
nRBC: 0.2 % (ref 0.0–0.2)

## 2021-05-31 LAB — APTT
aPTT: 83 seconds — ABNORMAL HIGH (ref 24–36)
aPTT: 78 seconds — ABNORMAL HIGH (ref 24–36)

## 2021-05-31 LAB — GLUCOSE, CAPILLARY
Glucose-Capillary: 226 mg/dL — ABNORMAL HIGH (ref 70–99)
Glucose-Capillary: 311 mg/dL — ABNORMAL HIGH (ref 70–99)
Glucose-Capillary: 313 mg/dL — ABNORMAL HIGH (ref 70–99)

## 2021-05-31 LAB — HEPARIN LEVEL (UNFRACTIONATED): Heparin Unfractionated: 0.97 IU/mL — ABNORMAL HIGH (ref 0.30–0.70)

## 2021-05-31 MED ORDER — INSULIN DETEMIR 100 UNIT/ML ~~LOC~~ SOLN
25.0000 [IU] | Freq: Two times a day (BID) | SUBCUTANEOUS | Status: DC
  Administered 2021-05-31 – 2021-06-01 (×3): 25 [IU] via SUBCUTANEOUS
  Filled 2021-05-31 (×5): qty 0.25

## 2021-05-31 MED ORDER — SENNOSIDES-DOCUSATE SODIUM 8.6-50 MG PO TABS
2.0000 | ORAL_TABLET | Freq: Two times a day (BID) | ORAL | Status: DC
  Administered 2021-05-31 – 2021-06-04 (×9): 2 via ORAL
  Filled 2021-05-31 (×9): qty 2

## 2021-05-31 MED ORDER — POTASSIUM CHLORIDE CRYS ER 20 MEQ PO TBCR
40.0000 meq | EXTENDED_RELEASE_TABLET | Freq: Once | ORAL | Status: AC
  Administered 2021-05-31: 40 meq via ORAL
  Filled 2021-05-31: qty 2

## 2021-05-31 MED ORDER — DULAGLUTIDE 1.5 MG/0.5ML ~~LOC~~ SOAJ
1.5000 mg | SUBCUTANEOUS | Status: DC
  Administered 2021-06-01: 1.5 mg via SUBCUTANEOUS
  Filled 2021-05-31 (×2): qty 0.5

## 2021-05-31 MED ORDER — POLYETHYLENE GLYCOL 3350 17 G PO PACK
17.0000 g | PACK | Freq: Two times a day (BID) | ORAL | Status: DC
  Administered 2021-05-31 – 2021-06-03 (×7): 17 g via ORAL
  Filled 2021-05-31 (×9): qty 1

## 2021-05-31 MED ORDER — INSULIN DETEMIR 100 UNIT/ML ~~LOC~~ SOLN
10.0000 [IU] | Freq: Two times a day (BID) | SUBCUTANEOUS | Status: DC
  Administered 2021-05-31: 10 [IU] via SUBCUTANEOUS
  Filled 2021-05-31 (×2): qty 0.1

## 2021-05-31 NOTE — Progress Notes (Signed)
ANTICOAGULATION CONSULT NOTE - Follow Up Consult ? ?Pharmacy Consult for heparin ?Indication:  IABP and Afib ?Heparin dosing wt: 171 kg ? ?Labs: ?Recent Labs  ?  05/28/21 ?0953 05/28/21 ?1855 05/29/21 ?[id number] 05/29/21 ?1352 05/30/21 ?0510 05/30/21 ?1200 05/30/21 ?2125 05/31/21 ?0458  ?HGB 10.7*   < > 11.0*  --  11.3*  --   --  11.7*  ?HCT 33.2*   < > 33.2*  --  34.6*  --   --  35.3*  ?PLT 256   < > 259  --  214  --   --  202  ?APTT  --   --  37*   < > 56* 97* 67* 83*  ?HEPARINUNFRC  --   --  >1.10*  --  >1.10*  --   --  0.97*  ?CREATININE  --    < > 2.36*   < > 2.11* 1.98*  --  2.18*  ?TROPONINIHS 103*  --   --   --   --   --   --   --   ? < > = values in this interval not displayed.  ? ? ? ?Assessment: ?36yo male with cardiogenic shock and a fib s/p  IABP. Patient received apixiban 4/11-4/13 for a fib. Pharmacy consulted for heparin.   ? ?aPTT is at high end of therapeutic at 83 seconds on heparin at 2200 units/hr. Heparin level still effected by apixiban. CBC stable. No s/x of bleeding per RN.  ? ?Goal of Therapy:  ?aPTT 66-85 seconds ?Monitor platelets per anticoagulation protocol: Yes  ?  ?Plan:  ?Continue heparin infusion at 2200 units/hr   ?Confirmatory level in 8 hours ?Daily aPTT, HL, and CBC while on heparin ?Monitor for s/x of bleeding ? ?Cathrine Muster, PharmD ?PGY2 Cardiology Pharmacy Resident ?Phone: [phone] ?05/31/2021  7:10 AM ? ?Please check [email or web address] for unit-specific pharmacy phone numbers. ? ? ?

## 2021-05-31 NOTE — Progress Notes (Signed)
ANTICOAGULATION CONSULT NOTE - Follow Up Consult ? ?Pharmacy Consult for heparin ?Indication:  IABP and Afib ?Heparin dosing wt: 171 kg ? ?Labs: ?Recent Labs  ?  05/29/21 ?9935 05/29/21 ?1352 05/30/21 ?0510 05/30/21 ?1200 05/30/21 ?2125 05/31/21 ?0458 05/31/21 ?1329  ?HGB 11.0*  --  11.3*  --   --  11.7*  --   ?HCT 33.2*  --  34.6*  --   --  35.3*  --   ?PLT 259  --  214  --   --  202  --   ?APTT 37*   < > 56* 97* 67* 83* 78*  ?HEPARINUNFRC >1.10*  --  >1.10*  --   --  0.97*  --   ?CREATININE 2.36*   < > 2.11* 1.98*  --  2.18*  --   ? < > = values in this interval not displayed.  ? ? ? ?Assessment: ?36yo male with cardiogenic shock and a fib s/p  IABP. Patient received apixiban 4/11-4/13 for a fib. Pharmacy consulted for heparin.   ? ?aPTT is therapeutic at 78 seconds on heparin at 2200 units/hr. CBC stable. No s/x of bleeding per RN.  ? ?Goal of Therapy:  ?aPTT 66-85 seconds ?Monitor platelets per anticoagulation protocol: Yes  ?  ?Plan:  ?Continue heparin infusion at 2200 units/hr   ?Daily aPTT, HL, and CBC while on heparin ?Monitor for s/x of bleeding ? ?Drake Leach, PharmD ?PGY2 Cardiology Pharmacy Resident ?Phone: [phone] ?05/31/2021  2:19 PM ? ?Please check [email or web address] for unit-specific pharmacy phone numbers. ? ? ?

## 2021-05-31 NOTE — Progress Notes (Signed)
? ? Advanced Heart Failure Rounding Note ? ?PCP-Cardiologist: Fransico Him, MD  ? ?Subjective:   ? ?04/11: Admitted with volume overload. On home milrinone 0.375 mcg. Started on lasix drip.  ?04/13: Milrinone increased to 0.5, NE added. IABP and SWAN placed d/t ongoing shock. ?04/14: DC-CV ? ?Remains in NSR. On IV amio and lasix gtt. On IABP 1:1, NE 18 and milrinone 0.5 Co-ox 49% ? ?Out almost 9L overnight. Weight down 5 pounds. CVP 27  CI 1.4 ? ?Says he feels a bit better. Breathing some better. Frustrated ? ?Scr 2.11 -> 1.98 -> 2.18   ? ? ?PAP: (50-73)/(32-49) 60/37 ?CVP:  [26 mmHg-34 mmHg] 27 mmHg ?PCWP:  [26 mmHg-31 mmHg] 30 mmHg ?CO:  [3.6 L/min-4.9 L/min] 3.6 L/min ?CI:  [1.3 L/min/m2-1.7 L/min/m2] 1.3 L/min/m2 ? ? ?Objective:   ?Weight Range: ?(!) 168.5 kg ?Body mass index is 47.69 kg/m?.  ? ?Vital Signs:   ?Temp:  [97.3 ?F (36.3 ?C)-99.3 ?F (37.4 ?C)] 97.5 ?F (36.4 ?C) (04/16 1045) ?Pulse Rate:  [87-202] 177 (04/16 1045) ?Resp:  [14-25] 19 (04/16 1045) ?BP: (99-132)/(52-108) 118/92 (04/16 1005) ?SpO2:  [78 %-100 %] 98 % (04/16 1045) ?Arterial Line BP: (100-122)/(87-119) 115/106 (04/16 1045) ?Weight:  [168.5 kg] 168.5 kg (04/16 0615) ?Last BM Date : 05/26/21 ? ?Weight change: ?Filed Weights  ? 05/28/21 0313 05/30/21 0630 05/31/21 0615  ?Weight: (!) 171.6 kg (!) 171 kg (!) 168.5 kg  ? ? ?Intake/Output:  ? ?Intake/Output Summary (Last 24 hours) at 05/31/2021 1218 ?Last data filed at 05/31/2021 1206 ?Gross per 24 hour  ?Intake 4412.82 ml  ?Output 8390 ml  ?Net -3977.18 ml  ? ?  ? ? ?Physical Exam  ? ?General:  Obese male lying in bed  No resp difficulty ?HEENT: normal ?Neck: supple. RIJ swan. JVP to ear Carotids 2+ bilat; no bruits. No lymphadenopathy or thryomegaly appreciated. ?Cor: PMI nondisplaced. Regular rate & rhythm. 2/6 TR ?Lungs: clear anteriorly ?Abdomen: obese soft, nontender, nondistended. No hepatosplenomegaly. No bruits or masses. Good bowel sounds. ?Extremities: no cyanosis, clubbing, rash, tr-1+  edema + RFA IABP  cool extremeities ?Neuro: alert & orientedx3, cranial nerves grossly intact. moves all 4 extremities w/o difficulty. Affect pleasant ? ? ?Telemetry  ? ?Sinus 90-100 Personally reviewed ? ?Labs  ?  ?CBC ?Recent Labs  ?  05/30/21 ?0510 05/31/21 ?0458  ?WBC 10.1 11.7*  ?HGB 11.3* 11.7*  ?HCT 34.6* 35.3*  ?MCV 91.8 90.5  ?PLT 214 202  ? ? ?Basic Metabolic Panel ?Recent Labs  ?  05/29/21 ?BP:[id number] 05/29/21 ?1352 05/30/21 ?1200 05/31/21 ?0458  ?NA 122*   < > 124* 124*  ?K 4.6   < > 3.6 3.7  ?CL 86*   < > 86* 84*  ?CO2 25   < > 28 29  ?GLUCOSE 208*   < > 242* 246*  ?BUN 60*   < > 60* 59*  ?CREATININE 2.36*   < > 1.98* 2.18*  ?CALCIUM 8.7*   < > 8.9 8.7*  ?MG 2.4  --   --   --   ? < > = values in this interval not displayed.  ? ? ?Liver Function Tests ?Recent Labs  ?  05/29/21 ?1352  ?AST 28  ?ALT 23  ?ALKPHOS 86  ?BILITOT 1.3*  ?PROT 7.5  ?ALBUMIN 3.2*  ? ? ?No results for input(s): LIPASE, AMYLASE in the last 72 hours. ? ?Cardiac Enzymes ?No results for input(s): CKTOTAL, CKMB, CKMBINDEX, TROPONINI in the last 72 hours. ? ?BNP: ?BNP (last 3  results) ?Recent Labs  ?  03/12/21 ?1505 04/05/21 ?0148 05/26/21 ?1813  ?BNP 448.3* 460.5* 342.4*  ? ? ? ?ProBNP (last 3 results) ?No results for input(s): PROBNP in the last 8760 hours. ? ? ?D-Dimer ?No results for input(s): DDIMER in the last 72 hours. ?Hemoglobin A1C ?No results for input(s): HGBA1C in the last 72 hours. ? ?Fasting Lipid Panel ?No results for input(s): CHOL, HDL, LDLCALC, TRIG, CHOLHDL, LDLDIRECT in the last 72 hours. ?Thyroid Function Tests ?No results for input(s): TSH, T4TOTAL, T3FREE, THYROIDAB in the last 72 hours. ? ?Invalid input(s): FREET3 ? ?Other results: ? ? ?Imaging  ? ? ?DG CHEST PORT 1 VIEW ? ?Result Date: 05/30/2021 ?CLINICAL DATA:  Chronic systolic heart failure. Aortic balloon pump in place. EXAM: PORTABLE CHEST 1 VIEW COMPARISON:  One-view chest x-ray 09/28/2021 FINDINGS: Heart is mildly enlarged. Swan-Ganz catheter terminates at the  main pulmonary outflow tract. Aortic balloon pump is stable. Mild pulmonary vascular congestion noted. IMPRESSION: Stable cardiomegaly and mild pulmonary vascular congestion. Electronically Signed   By: San Morelle M.D.   On: 05/30/2021 12:26   ? ? ?Medications:   ? ? ?Scheduled Medications: ? Chlorhexidine Gluconate Cloth  6 each Topical Daily  ? insulin aspart  0-20 Units Subcutaneous TID WC  ? insulin aspart  0-5 Units Subcutaneous QHS  ? insulin aspart  2 Units Subcutaneous TID WC  ? insulin detemir  10 Units Subcutaneous BID  ? pantoprazole  40 mg Oral Daily  ? polyethylene glycol  17 g Oral BID  ? senna-docusate  2 tablet Oral BID  ? sodium chloride flush  3 mL Intravenous Q12H  ? ? ?Infusions: ? sodium chloride    ? sodium chloride    ? sodium chloride    ? sodium chloride    ? amiodarone 30 mg/hr (05/31/21 1200)  ? furosemide (LASIX) 200 mg in dextrose 5% 100 mL (2mg /mL) infusion 30 mg/hr (05/31/21 1200)  ? heparin 2,200 Units/hr (05/31/21 1200)  ? milrinone 0.5 mcg/kg/min (05/31/21 1200)  ? norepinephrine (LEVOPHED) Adult infusion 18.027 mcg/min (05/31/21 1200)  ? ? ?PRN Medications: ?sodium chloride, sodium chloride, acetaminophen, albuterol, ALPRAZolam, alum & mag hydroxide-simeth, azelastine, gabapentin, hyoscyamine, loratadine, nitroGLYCERIN, ondansetron, simethicone, sodium chloride flush, traMADol ? ? ? ?Patient Profile  ? ?[name] is a 36 year old with end stage HFrEF, on home milrinnone, NICM, HTN, asthma, OSA, obesity and DMII.  ?  ?Admitted with A/C End Stage HFrEF.  ? ?Assessment/Plan  ? ?1. A/C End Stage HFrEF , NICM -->Cardiogenic Shock ?-EF has been down for some time.   Echo 10/2020 EF < 20% and moderate RV dysfunction. He has been on home milrinone 0.375 since 9/22.  Not thought to be candidate for transplant d/t obesity and noncompliance. No VAD due to severe RV dysfunction (CVP 37 on cath) Echo in 1/23 reviewed, shows EF < 20%, mild LV dilation, severe RV dysfunction with  moderate RV enlargement.  ?-On home milrinone 0.375 mcg.  ?- He is critically ill with severe biventricular HF. Has end-stage HF at baseline on home milrinone. He was tipped over by AFL. -> s/p DC-CV on 4/14 ?- Now with IABP, NE 18 milrinone 0.5. Hemodynamics remain very tenuous ?- Massively volume overloaded with CVP 27  Diuresing well on lasix gtt.  ?- No BB with low output ?- Off digoxin d/t AKI an elevated level ?- Home spiro, bidil and entresto stopped d/t AKI and shock.Back on spiro now.  ?- No SGLT2i due to fungal infections.  ?-  Long talk about tenuous prognosis with Hospice team at bedside. He wants to continue to push for now. He realizes he may not recover.  ?  ?2. DMII ?- sugars high. Continue SSI  ?- A1c 8.8 ?  ?3. AKI on CKD Stage IIIb  ?-Creatinine baseline recently 1.1-1.3 with recent AKIs ?-Creatinine 1.5>1.8>2.44>2.36 -> 2.11 -> 1.98 -> 2.18  in setting of cardiogenic shock ?- continue hemodynamic support ?  ?4. PAF--->A Flutter RVR  ?- s/p DC-CV on 4/14. Remains in NSR  ?- On amio gtt. continue ?- Continue heparin gtt ?  ?5. OSA: Severe AHI 65 ?- CPAP ?  ?6. Morbid obesity  ?- He was supposed to start [facility] but Medicaid denied it.  ?- He has continued on Trulicity.  ?- Body mass index is 47.69 kg/m?. ?  ?7. Acute Hypoxic Respiratory Failure ?-Decompensated with sats in 70s.  ?-O2 sats now stable on 4L [place] ? ?8. Hyponatremia ?- Na 124 ? ?9. Hypokalemia ?- supp ? ?10. GOC  ?-Active with [place] ?-Patient wants to be full code at this time. Discussed this admit. ? ?CRITICAL CARE ?Performed by: Glori Bickers ? ?Total critical care time: 45 minutes ? ?Critical care time was exclusive of separately billable procedures and treating other patients. ? ?Critical care was necessary to treat or prevent imminent or life-threatening deterioration. ? ?Critical care was time spent personally by me (independent of midlevel providers or residents) on the following activities: development of treatment  plan with patient and/or surrogate as well as nursing, discussions with consultants, evaluation of patient's response to treatment, examination of patient, obtaining history from patient or surrogate, ordering

## 2021-05-31 NOTE — Progress Notes (Signed)
Pt places self on home cpap 

## 2021-05-31 NOTE — Progress Notes (Addendum)
[place] 2H06 [facility]) hospitalized hospice patient. ?  ?This is a current [facility] hospice home care patient with a terminal diagnosis of hypertensive heart disease with chronic systolic CHF.  Patient contacted Dr Haroldine Laws with complaints of weight gain of > 20lbs, lower extremity edema and abdomen. Advised by Dr. Haroldine Laws to go to [facility] ED for diuresis. Patient admitted on 05/26/2021 with diagnosis of end stage CHF.  Per Dr. Tomasa Hosteller with [facility] this is a hospice related admission. ?  ?This patient is appropriate for inpatient status due to need for IV medications for symptom management.  ?  ?Met with patient, RN, and Dr Haroldine Laws at bedside. Patient a&o x4, reports feeling somewhat better since admission. Patient would still like to continue to see in the next week if he can get back to his baseline status. He will remain in the hospital to see if this is possible. He is willing to try and see where it goes. If this cant happen some medical care decisions will need to take place and the patient is aware.  ?  ?VS: 97.7,  118/92  spO2 93% ?I/O:1415.09/3523 ?  ?Abnormal Labs: Na+124, Chloride 86, Glucose 227, BUN 60, Creatinine 2.11, Ca+ 8.7, GFR 41, RBC 3.77, Hgb 11.3, HCT 34.6,  ?  ?Diagnostics: ?PORTABLE CHEST 1 VIEW ?COMPARISON:  One-view chest x-ray 09/28/2021  ?FINDINGS: ?Heart is mildly enlarged. Swan-Ganz catheter terminates at the main ?pulmonary outflow tract. Aortic balloon pump is stable. Mild ?pulmonary vascular congestion noted.  ?IMPRESSION: ?Stable cardiomegaly and mild pulmonary vascular congestion.  ?Electronically Signed ?  By: San Morelle M.D. ?  On: 05/30/2021 12:26  ?  ?IV/PRN medications: Amiodarone 16.49m/30mg/hr continuous, Lasix 144m30mg/hr continuous, Milrinone 24.26m626mr Dose: 0.5 mcg/kg/min continuous, Levophed Rate: 4.69-37.5 mL/hr Dose: 5-40 mcg/min titrated GOAL:  MAP of > 65, SBP > 90. Heparin 3m12m20units/hr,  ?  ?Problem list: ?1. A/C End Stage HFrEF , NICM  -->Cardiogenic Shock ?-EF has been down for some time.   Echo 10/2020 EF < 20% and moderate RV dysfunction. He has been on home milrinone 0.375 since 9/22.  Not thought to be candidate for transplant d/t obesity and noncompliance. No VAD due to severe RV dysfunction (CVP 37 on cath) Echo in 1/23 reviewed, shows EF < 20%, mild LV dilation, severe RV dysfunction with moderate RV enlargement.  ?-On home milrinone 0.375 mcg.  ?- He is critically ill with severe biventricular HF. Has end-stage HF at baseline on home milrinone. He was tipped over by AFL. -> s/p DC-CV on 4/14 ?- Now with IABP, NE 18 milrinone 0.5. Hemodynamics remain very tenuous ?- Massively volume overloaded with CVP 27  Diuresing well on lasix gtt.  ?- No BB with low output ?- Off digoxin d/t AKI an elevated level ?- Home spiro, bidil and entresto stopped d/t AKI and shock.Back on spiro now.  ?- No SGLT2i due to fungal infections.  ?- Long talk about tenuous prognosis with Hospice team at bedside. He wants to continue to push for now. He realizes he may not recover.  ?  ?2. DMII ?- sugars high. Continue SSI  ?- A1c 8.8 ?  ?3. AKI on CKD Stage IIIb  ?-Creatinine baseline recently 1.1-1.3 with recent AKIs ?-Creatinine 1.5>1.8>2.44>2.36 -> 2.11 -> 1.98 -> 2.18  in setting of cardiogenic shock ?- continue hemodynamic support ?  ?4. PAF--->A Flutter RVR  ?- s/p DC-CV on 4/14. Remains in NSR  ?- On amio gtt. continue ?- Continue heparin gtt ?  ?5. OSA: Severe AHI  65 ?- CPAP ?  ?6. Morbid obesity  ?- He was supposed to start [facility] but Medicaid denied it.  ?- He has continued on Trulicity.  ?- Body mass index is 47.69 kg/m?. ?  ?7. Acute Hypoxic Respiratory Failure ?-Decompensated with sats in 70s.  ?-O2 sats now stable on 4L [place] ?  ?8. Hyponatremia ?- Na 124 ?  ?9. Hypokalemia ?- supp ?  ?  ?10. GOC  ?-Active with [place] ?-Patient wants to be full code at this time. Discussed this admit.  ? Discharge planning: return home with hospice when stable for  discharge ?  ?Family Contact: Spoke with wife over phone ?IDG: updated ?GOC: on going. Remains a full code.  ?  ?Should patient need ambulance transport at discharge please use [facility] as [facility] contracts this service with them for our active hospice patients. ?  ?Please do not hesitate to call with questions.   ?Thank you,   ?Clementeen Hoof, BSN, RN ?Hospice hospital liaison ?[phone] ? ? ?

## 2021-06-01 ENCOUNTER — Inpatient Hospital Stay (HOSPITAL_COMMUNITY): Payer: Medicaid Other

## 2021-06-01 ENCOUNTER — Encounter (HOSPITAL_COMMUNITY): Payer: Self-pay | Admitting: Internal Medicine

## 2021-06-01 DIAGNOSIS — I25119 Atherosclerotic heart disease of native coronary artery with unspecified angina pectoris: Secondary | ICD-10-CM | POA: Diagnosis not present

## 2021-06-01 DIAGNOSIS — Z9981 Dependence on supplemental oxygen: Secondary | ICD-10-CM | POA: Diagnosis not present

## 2021-06-01 DIAGNOSIS — I517 Cardiomegaly: Secondary | ICD-10-CM | POA: Diagnosis not present

## 2021-06-01 DIAGNOSIS — J9691 Respiratory failure, unspecified with hypoxia: Secondary | ICD-10-CM | POA: Diagnosis not present

## 2021-06-01 DIAGNOSIS — G4733 Obstructive sleep apnea (adult) (pediatric): Secondary | ICD-10-CM | POA: Diagnosis not present

## 2021-06-01 DIAGNOSIS — I130 Hypertensive heart and chronic kidney disease with heart failure and stage 1 through stage 4 chronic kidney disease, or unspecified chronic kidney disease: Secondary | ICD-10-CM | POA: Diagnosis not present

## 2021-06-01 DIAGNOSIS — T80212D Local infection due to central venous catheter, subsequent encounter: Secondary | ICD-10-CM | POA: Diagnosis not present

## 2021-06-01 DIAGNOSIS — F419 Anxiety disorder, unspecified: Secondary | ICD-10-CM | POA: Diagnosis not present

## 2021-06-01 DIAGNOSIS — F32A Depression, unspecified: Secondary | ICD-10-CM | POA: Diagnosis not present

## 2021-06-01 DIAGNOSIS — R059 Cough, unspecified: Secondary | ICD-10-CM | POA: Diagnosis not present

## 2021-06-01 DIAGNOSIS — I5022 Chronic systolic (congestive) heart failure: Secondary | ICD-10-CM | POA: Diagnosis not present

## 2021-06-01 DIAGNOSIS — M14672 Charcot's joint, left ankle and foot: Secondary | ICD-10-CM | POA: Diagnosis not present

## 2021-06-01 DIAGNOSIS — J45909 Unspecified asthma, uncomplicated: Secondary | ICD-10-CM | POA: Diagnosis not present

## 2021-06-01 DIAGNOSIS — L304 Erythema intertrigo: Secondary | ICD-10-CM | POA: Diagnosis not present

## 2021-06-01 DIAGNOSIS — J302 Other seasonal allergic rhinitis: Secondary | ICD-10-CM | POA: Diagnosis not present

## 2021-06-01 DIAGNOSIS — M545 Low back pain, unspecified: Secondary | ICD-10-CM | POA: Diagnosis not present

## 2021-06-01 DIAGNOSIS — R570 Cardiogenic shock: Secondary | ICD-10-CM | POA: Diagnosis not present

## 2021-06-01 DIAGNOSIS — E1142 Type 2 diabetes mellitus with diabetic polyneuropathy: Secondary | ICD-10-CM | POA: Diagnosis not present

## 2021-06-01 DIAGNOSIS — E669 Obesity, unspecified: Secondary | ICD-10-CM | POA: Diagnosis not present

## 2021-06-01 DIAGNOSIS — Z794 Long term (current) use of insulin: Secondary | ICD-10-CM | POA: Diagnosis not present

## 2021-06-01 DIAGNOSIS — I4892 Unspecified atrial flutter: Secondary | ICD-10-CM | POA: Diagnosis not present

## 2021-06-01 DIAGNOSIS — I5084 End stage heart failure: Secondary | ICD-10-CM | POA: Diagnosis not present

## 2021-06-01 DIAGNOSIS — J811 Chronic pulmonary edema: Secondary | ICD-10-CM | POA: Diagnosis not present

## 2021-06-01 LAB — BASIC METABOLIC PANEL
Anion gap: 10 (ref 5–15)
Anion gap: 14 (ref 5–15)
BUN: 57 mg/dL — ABNORMAL HIGH (ref 6–20)
BUN: 55 mg/dL — ABNORMAL HIGH (ref 6–20)
CO2: 31 mmol/L (ref 22–32)
CO2: 25 mmol/L (ref 22–32)
Calcium: 9 mg/dL (ref 8.9–10.3)
Calcium: 9 mg/dL (ref 8.9–10.3)
Chloride: 86 mmol/L — ABNORMAL LOW (ref 98–111)
Chloride: 87 mmol/L — ABNORMAL LOW (ref 98–111)
Creatinine, Ser: 2 mg/dL — ABNORMAL HIGH (ref 0.61–1.24)
Creatinine, Ser: 1.79 mg/dL — ABNORMAL HIGH (ref 0.61–1.24)
GFR, Estimated: 44 mL/min — ABNORMAL LOW (ref >60)
GFR, Estimated: 50 mL/min — ABNORMAL LOW (ref >60)
Glucose, Bld: 253 mg/dL — ABNORMAL HIGH (ref 70–99)
Glucose, Bld: 262 mg/dL — ABNORMAL HIGH (ref 70–99)
Potassium: 3.8 mmol/L (ref 3.5–5.1)
Potassium: 3.8 mmol/L (ref 3.5–5.1)
Sodium: 127 mmol/L — ABNORMAL LOW (ref 135–145)
Sodium: 126 mmol/L — ABNORMAL LOW (ref 135–145)

## 2021-06-01 LAB — CBC
HCT: 36.1 % — ABNORMAL LOW (ref 39.0–52.0)
Hemoglobin: 11.5 g/dL — ABNORMAL LOW (ref 13.0–17.0)
MCH: 29.3 pg (ref 26.0–34.0)
MCHC: 31.9 g/dL (ref 30.0–36.0)
MCV: 91.9 fL (ref 80.0–100.0)
Platelets: 186 10*3/uL (ref 150–400)
RBC: 3.93 MIL/uL — ABNORMAL LOW (ref 4.22–5.81)
RDW: 16.3 % — ABNORMAL HIGH (ref 11.5–15.5)
WBC: 13.6 10*3/uL — ABNORMAL HIGH (ref 4.0–10.5)
nRBC: 0 % (ref 0.0–0.2)

## 2021-06-01 LAB — GLUCOSE, CAPILLARY
Glucose-Capillary: 217 mg/dL — ABNORMAL HIGH (ref 70–99)
Glucose-Capillary: 255 mg/dL — ABNORMAL HIGH (ref 70–99)
Glucose-Capillary: 80 mg/dL (ref 70–99)
Glucose-Capillary: 266 mg/dL — ABNORMAL HIGH (ref 70–99)

## 2021-06-01 LAB — HEPARIN LEVEL (UNFRACTIONATED): Heparin Unfractionated: 0.68 IU/mL (ref 0.30–0.70)

## 2021-06-01 LAB — COOXEMETRY PANEL
Carboxyhemoglobin: 1.4 % (ref 0.5–1.5)
Methemoglobin: 0.7 % (ref 0.0–1.5)
O2 Saturation: 51.5 %
Total hemoglobin: 12 g/dL (ref 12.0–16.0)

## 2021-06-01 LAB — APTT: aPTT: 79 seconds — ABNORMAL HIGH (ref 24–36)

## 2021-06-01 LAB — PROCALCITONIN: Procalcitonin: 0.41 ng/mL

## 2021-06-01 LAB — MAGNESIUM: Magnesium: 2.2 mg/dL (ref 1.7–2.4)

## 2021-06-01 MED ORDER — SODIUM CHLORIDE 0.9 % IV SOLN
2.0000 g | Freq: Three times a day (TID) | INTRAVENOUS | Status: DC
  Administered 2021-06-01 – 2021-06-04 (×10): 2 g via INTRAVENOUS
  Filled 2021-06-01 (×10): qty 12.5

## 2021-06-01 MED ORDER — VANCOMYCIN HCL 2000 MG/400ML IV SOLN
2000.0000 mg | Freq: Once | INTRAVENOUS | Status: DC
  Filled 2021-06-01: qty 400

## 2021-06-01 MED ORDER — SORBITOL 70 % SOLN
30.0000 mL | Freq: Once | Status: AC
  Administered 2021-06-01: 30 mL via ORAL
  Filled 2021-06-01: qty 30

## 2021-06-01 MED ORDER — POTASSIUM CHLORIDE CRYS ER 20 MEQ PO TBCR
40.0000 meq | EXTENDED_RELEASE_TABLET | Freq: Once | ORAL | Status: AC
  Administered 2021-06-01: 40 meq via ORAL
  Filled 2021-06-01: qty 2

## 2021-06-01 MED ORDER — SODIUM CHLORIDE 0.9% FLUSH: 10.0000 mL | INTRAVENOUS | Status: DC | PRN

## 2021-06-01 MED ORDER — VANCOMYCIN HCL 10 G IV SOLR
2500.0000 mg | Freq: Once | INTRAVENOUS | Status: AC
  Administered 2021-06-01: 2500 mg via INTRAVENOUS
  Filled 2021-06-01: qty 2500

## 2021-06-01 MED ORDER — SODIUM CHLORIDE 0.9% FLUSH
10.0000 mL | Freq: Two times a day (BID) | INTRAVENOUS | Status: DC
  Administered 2021-06-01 – 2021-06-04 (×6): 10 mL

## 2021-06-01 MED ORDER — VANCOMYCIN HCL 2000 MG/400ML IV SOLN
2000.0000 mg | INTRAVENOUS | Status: DC
  Filled 2021-06-01: qty 400

## 2021-06-01 NOTE — Progress Notes (Signed)
ANTICOAGULATION CONSULT NOTE - Follow Up Consult ? ?Pharmacy Consult for heparin ?Indication:  IABP and Afib ?Heparin dosing wt: 171 kg ? ?Labs: ?Recent Labs  ?  05/30/21 ?0510 05/30/21 ?1200 05/31/21 ?0458 05/31/21 ?1329 05/31/21 ?1555 06/01/21 ?0417  ?HGB 11.3*  --  11.7*  --   --  11.5*  ?HCT 34.6*  --  35.3*  --   --  36.1*  ?PLT 214  --  202  --   --  186  ?APTT 56*   < > 83* 78*  --  79*  ?HEPARINUNFRC >1.10*  --  0.97*  --   --  0.68  ?CREATININE 2.11*   < > 2.18*  --  1.90* 2.00*  ? < > = values in this interval not displayed.  ? ? ? ?Assessment: ?36yo male with cardiogenic shock and atrial fibrillation s/p IABP. Patient received apixaban 4/11-4/13 for atrial fibrillation. Pharmacy consulted for heparin.   ? ?aPTT is therapeutic at 79 seconds on heparin at 2,200 units/hr. CBC stable. No s/sx of bleeding or issues with infusion per RN.  ? ?Goal of Therapy:  ?aPTT 66-85 seconds ?Monitor platelets per anticoagulation protocol: Yes  ?  ?Plan:  ?Continue heparin infusion at 2,200 units/hr   ?Daily aPTT until correlates with HL ?Daily HL and CBC  ?Monitor for s/sx of bleeding ? ?Eliseo Gum, PharmD Candidate  ?06/01/2021 7:35 AM  ? ?Please check [email or web address] for unit-specific pharmacy phone numbers. ? ? ?

## 2021-06-01 NOTE — Progress Notes (Signed)
[place] 2H06 [facility]) hospitalized hospice patient. ?  ?This is a current [facility] hospice home care patient with a terminal diagnosis of hypertensive heart disease with chronic systolic CHF.  Patient contacted Dr Haroldine Laws with complaints of weight gain of > 20lbs, lower extremity edema and abdomen. Advised by Dr. Haroldine Laws to go to [facility] ED for diuresis. Patient admitted on 05/26/2021 with diagnosis of end stage CHF.  Per Dr. Tomasa Hosteller with [facility] this is a hospice related admission. ?  ?This patient is appropriate for inpatient status due to need for IV medications for symptom management.  ?  ?Met with patient and spouse at bedside. Patient a&o x4, reports feeling somewhat better since admission but voiced he needed to have a bowel movement.  Patient would still like to continue to see in the next week if he can get back to his baseline status. He will remain in the hospital to see if this is possible. He is willing to try and see where it goes. If this cant happen some medical care decisions will need to take place and the patient is aware.  ?  ?VS: 97.7,  138/106, 99HR, 22RR. spO2 93% ?I/O:1034.10/1708 ?  ?Abnormal Labs:  ? Latest Reference Range & Units 06/01/21 04:17  ?Total hemoglobin 12.0 - 16.0 g/dL 12.0  ?Carboxyhemoglobin 0.5 - 1.5 % 1.4  ?Methemoglobin 0.0 - 1.5 % 0.7  ?O2 Saturation % 51.5  ?BASIC METABOLIC PANEL  Rpt !  ?Sodium 135 - 145 mmol/L 127 (L)  ?Potassium 3.5 - 5.1 mmol/L 3.8  ?Chloride 98 - 111 mmol/L 86 (L)  ?CO2 22 - 32 mmol/L 31  ?Glucose 70 - 99 mg/dL 253 (H)  ?BUN 6 - 20 mg/dL 57 (H)  ?Creatinine 0.61 - 1.24 mg/dL 2.00 (H)  ?Calcium 8.9 - 10.3 mg/dL 9.0  ?Anion gap 5 - 15  10  ?Magnesium 1.7 - 2.4 mg/dL 2.2  ?GFR, Estimated >60 mL/min 44 (L)  ?WBC 4.0 - 10.5 K/uL 13.6 (H)  ?RBC 4.22 - 5.81 MIL/uL 3.93 (L)  ?Hemoglobin 13.0 - 17.0 g/dL 11.5 (L)  ?HCT 39.0 - 52.0 % 36.1 (L)  ?MCV 80.0 - 100.0 fL 91.9  ?MCH 26.0 - 34.0 pg 29.3  ?MCHC 30.0 - 36.0 g/dL 31.9  ?RDW 11.5 - 15.5 % 16.3 (H)   ?Platelets 150 - 400 K/uL 186  ?nRBC 0.0 - 0.2 % 0.0  ?Heparin Unfractionated 0.30 - 0.70 IU/mL 0.68  ?APTT 24 - 36 seconds 79 (H)  ? ?  ?Diagnostics: ?PORTABLE CHEST 1 VIEW ?COMPARISON:  One-view chest x-ray 09/28/2021  ?FINDINGS: ?Heart is mildly enlarged. Swan-Ganz catheter terminates at the main ?pulmonary outflow tract. Aortic balloon pump is stable. Mild ?pulmonary vascular congestion noted.  ?IMPRESSION: ?Stable cardiomegaly and mild pulmonary vascular congestion.  ?Electronically Signed ?  By: San Morelle M.D. ?  On: 05/30/2021 12:26  ?  ?IV/PRN medications: Amiodarone 16.80m/30mg/hr continuous, Lasix 189m30mg/hr continuous, Milrinone 24.4m39mr Dose: 0.5 mcg/kg/min continuous, Levophed Rate: 4.69-37.5 mL/hr Dose: 5-40 mcg/min titrated GOAL:  MAP of > 65, SBP > 90. Heparin 9m50m20units/hr, Cefepime 2g/150mg58mr ?  ?Problem list: ?1. A/C End Stage HFrEF , NICM -->Cardiogenic Shock ?-EF has been down for some time.   Echo 10/2020 EF < 20% and moderate RV dysfunction. He has been on home milrinone 0.375 since 9/22.  Not thought to be candidate for transplant d/t obesity and noncompliance. No VAD due to severe RV dysfunction (CVP 37 on cath) Echo in 1/23 reviewed, shows EF < 20%, mild LV  dilation, severe RV dysfunction with moderate RV enlargement.  ?-On home milrinone 0.375 mcg.  ?- He is critically ill with severe biventricular HF. Has end-stage HF at baseline on home milrinone. He was tipped over by AFL. -> s/p DC-CV on 4/14 ?- Now with IABP, NE 18 milrinone 0.5. Hemodynamics remain very tenuous ?- Massively volume overloaded with CVP 27  Diuresing well on lasix gtt.  ?- No BB with low output ?- Off digoxin d/t AKI an elevated level ?- Home spiro, bidil and entresto stopped d/t AKI and shock.Back on spiro now.  ?- No SGLT2i due to fungal infections.  ?- Long talk about tenuous prognosis with Hospice team at bedside. He wants to continue to push for now. He realizes he may not recover.  ?  ?2.  DMII ?- sugars high. Continue SSI  ?- A1c 8.8 ?  ?3. AKI on CKD Stage IIIb  ?-Creatinine baseline recently 1.1-1.3 with recent AKIs ?-Creatinine 1.5>1.8>2.44>2.36 -> 2.11 -> 1.98 -> 2.18  in setting of cardiogenic shock ?- continue hemodynamic support ?  ?4. PAF--->A Flutter RVR  ?- s/p DC-CV on 4/14. Remains in NSR  ?- On amio gtt. continue ?- Continue heparin gtt ?  ?5. OSA: Severe AHI 65 ?- CPAP ?  ?6. Morbid obesity  ?- He was supposed to start [facility] but Medicaid denied it.  ?- He has continued on Trulicity.  ?- Body mass index is 47.69 kg/m?. ?  ?7. Acute Hypoxic Respiratory Failure ?-Decompensated with sats in 70s.  ?-O2 sats now stable on 4L [place] ?  ?8. Hyponatremia ?- Na 124 ?  ?9. Hypokalemia ?- supp ?  ?  ?10. GOC  ?-Active with [place] ?-Patient wants to be full code at this time. Discussed this admit.  ? Discharge planning: return home with hospice when stable for discharge ?  ?Family Contact: updated at bedside ?IDG: updated ?GOC: on going. Remains a full code.  ?  ?Should patient need ambulance transport at discharge please use [facility] as [facility] contracts this service with them for our active hospice patients. ?  ?Please do not hesitate to call with questions.   ?Thank you,   ?Clementeen Hoof, BSN, RN ?Hospice hospital liaison ?[phone] ?  ?  ? ? ?

## 2021-06-01 NOTE — Progress Notes (Addendum)
Pharmacy Antibiotic Note ? ?Jonathan [name] is a 36 y.o. male admitted on 05/26/2021 with concern for PNA.  Pharmacy has been consulted for vancomycin dosing. Patient is afebrile with leukocytosis.  ? ?Plan: ?Vancomycin load 2.5 g IV x 1, followed by 2g IV Q24h (eAUC 444.9) ?Cefepime 2 g IV Q8h  ?F/u CXR  ?Monitor CBC and renal function daily  ?Obtain vancomycin levels at steady state   ? ?Height: 6\' 2"  (188 cm) ?Weight: (!) 167.5 kg (369 lb 4.3 oz) ?IBW/kg (Calculated) : 82.2 ? ?Temp (24hrs), Avg:98.3 ?F (36.8 ?C), Min:97.3 ?F (36.3 ?C), Max:99.7 ?F (37.6 ?C) ? ?Recent Labs  ?Lab 05/28/21 ?0808 05/28/21 ?0953 05/28/21 ?2243 05/29/21 ?05/31/21 05/29/21 ?1352 05/30/21 ?0510 05/30/21 ?1200 05/31/21 ?0458 05/31/21 ?1555 06/01/21 ?0417  ?WBC  --    < > 8.7 8.4  --  10.1  --  11.7*  --  13.6*  ?CREATININE  --   --  2.44* 2.36*   < > 2.11* 1.98* 2.18* 1.90* 2.00*  ?LATICACIDVEN 2.2*  --  1.5  --   --   --   --   --   --   --   ? < > = values in this interval not displayed.  ?  ?Estimated Creatinine Clearance: 84 mL/min (A) (by C-G formula based on SCr of 2 mg/dL (H)).   ? ?Allergies  ?Allergen Reactions  ? Hydrocodone   ?  Hives   ? ?Antimicrobials this admission: ?4/17 vancomycin >>  ?4/17 cefepime >>  ? ?Microbiology results: ?4/12 MRSA PCR: neg ? ?Thank you for allowing pharmacy to be a part of this patient?s care. ? ?6/12, PharmD Candidate  ?06/01/2021 8:05 AM  ? ?

## 2021-06-01 NOTE — Progress Notes (Addendum)
? ? Advanced Heart Failure Rounding Note ? ?PCP-Cardiologist: Fransico Him, MD  ? ?Subjective:   ? ?04/11: Admitted with volume overload/CGS precipitated by AFL with RVR. On home milrinone 0.375 mcg. Started on lasix drip.  ?04/13: Milrinone increased to 0.5, NE added. IABP and SWAN placed d/t ongoing shock. ?04/14: AFL >> S/p DC-CV to SR ? ?CO-OX 52% on milrinone 0.5 + NE 18. Continues on lasix gtt at 30/hr.  ? ?10L UOP charted yesterday. Weight down 2 lb, doubt this is accurate (bed weight). ? ?Scr 2.11.1.98>2.18>1.90>2.00 ? ?Remains in NSR with runs NSVT, on amio gtt at 30/hr.  ? ?MAPs 100s-110s ? ?WBCs 10.1>11.7>13.6. AF. ? ?Reports low back pain and abdominal discomfort with gas. Dyspnea improved.  ? ? ? ?SWAN #s ?PAP: (47-70)/(31-48) 58/38 ?CVP:  [22 mmHg-32 mmHg] 23 mmHg ?PCWP:  [27 mmHg-37 mmHg] 37 mmHg ?CO:  [3.6 L/min-4.6 L/min] 4.4 L/min ?CI:  [1.3 L/min/m2-1.6 L/min/m2] 1.5 L/min/m2 ? ? ?Objective:   ?Weight Range: ?(!) 167.5 kg ?Body mass index is 47.41 kg/m?.  ? ?Vital Signs:   ?Temp:  [97.3 ?F (36.3 ?C)-99.7 ?F (37.6 ?C)] 98.2 ?F (36.8 ?C) (04/17 0745) ?Pulse Rate:  [id number] (04/17 0745) ?Resp:  [13-36] 19 (04/17 0745) ?BP: (118-148)/(88-109) 138/106 (04/17 0100) ?SpO2:  [86 %-100 %] 95 % (04/17 0745) ?Arterial Line BP: (98-127)/(88-112) 125/105 (04/17 0745) ?Weight:  [167.5 kg] 167.5 kg (04/17 0630) ?Last BM Date : 05/27/21 ? ?Weight change: ?Filed Weights  ? 05/30/21 0630 05/31/21 0615 06/01/21 0630  ?Weight: (!) 171 kg (!) 168.5 kg (!) 167.5 kg  ? ? ?Intake/Output:  ? ?Intake/Output Summary (Last 24 hours) at 06/01/2021 0758 ?Last data filed at 06/01/2021 0745 ?Gross per 24 hour  ?Intake 3570.11 ml  ?Output 10495 ml  ?Net -6924.89 ml  ?  ? ? ?Physical Exam  ?CVP 23 ?General:  Lying in bed. Appears fatigued. ?HEENT: normal ?Neck: supple. JVP to jaw. Carotids 2+ bilat; no bruits.  ?Cor: PMI nondisplaced. Regular rate & rhythm. No rubs, gallops or murmurs. ?Lungs: clear ?Abdomen: soft, nontender,  nondistended.  ?Extremities: no cyanosis, clubbing, rash, 1+ edema, lower extremities cool, + RFA IABP ?Neuro: alert & orientedx3, cranial nerves grossly intact. moves all 4 extremities w/o difficulty. Affect pleasant ? ? ? ?Telemetry  ? ?Sinus 90s, runs of NSVT this am ? ?Labs  ?  ?CBC ?Recent Labs  ?  05/31/21 ?0458 06/01/21 ?0417  ?WBC 11.7* 13.6*  ?HGB 11.7* 11.5*  ?HCT 35.3* 36.1*  ?MCV 90.5 91.9  ?PLT 202 186  ? ?Basic Metabolic Panel ?Recent Labs  ?  05/31/21 ?1555 06/01/21 ?0417  ?NA 126* 127*  ?K 3.7 3.8  ?CL 84* 86*  ?CO2 29 31  ?GLUCOSE 271* 253*  ?BUN 56* 57*  ?CREATININE 1.90* 2.00*  ?CALCIUM 8.9 9.0  ?MG  --  2.2  ? ?Liver Function Tests ?Recent Labs  ?  05/29/21 ?1352  ?AST 28  ?ALT 23  ?ALKPHOS 86  ?BILITOT 1.3*  ?PROT 7.5  ?ALBUMIN 3.2*  ? ?No results for input(s): LIPASE, AMYLASE in the last 72 hours. ? ?Cardiac Enzymes ?No results for input(s): CKTOTAL, CKMB, CKMBINDEX, TROPONINI in the last 72 hours. ? ?BNP: ?BNP (last 3 results) ?Recent Labs  ?  03/12/21 ?1505 04/05/21 ?0148 05/26/21 ?1813  ?BNP 448.3* 460.5* 342.4*  ? ? ?ProBNP (last 3 results) ?No results for input(s): PROBNP in the last 8760 hours. ? ? ?D-Dimer ?No results for input(s): DDIMER in the last 72 hours. ?Hemoglobin A1C ?No results  for input(s): HGBA1C in the last 72 hours. ? ?Fasting Lipid Panel ?No results for input(s): CHOL, HDL, LDLCALC, TRIG, CHOLHDL, LDLDIRECT in the last 72 hours. ?Thyroid Function Tests ?No results for input(s): TSH, T4TOTAL, T3FREE, THYROIDAB in the last 72 hours. ? ?Invalid input(s): FREET3 ? ?Other results: ? ? ?Imaging  ? ? ?No results found. ? ? ?Medications:   ? ? ?Scheduled Medications: ? Chlorhexidine Gluconate Cloth  6 each Topical Daily  ? Dulaglutide  1.5 mg Subcutaneous Weekly  ? insulin aspart  0-20 Units Subcutaneous TID WC  ? insulin aspart  0-5 Units Subcutaneous QHS  ? insulin aspart  2 Units Subcutaneous TID WC  ? insulin detemir  25 Units Subcutaneous BID  ? pantoprazole  40 mg Oral Daily   ? polyethylene glycol  17 g Oral BID  ? senna-docusate  2 tablet Oral BID  ? sodium chloride flush  10-40 mL Intracatheter Q12H  ? sodium chloride flush  3 mL Intravenous Q12H  ? sorbitol  30 mL Oral Once  ? ? ?Infusions: ? sodium chloride    ? sodium chloride    ? sodium chloride    ? sodium chloride    ? amiodarone 30 mg/hr (06/01/21 0700)  ? ceFEPime (MAXIPIME) IV    ? furosemide (LASIX) 200 mg in dextrose 5% 100 mL (2mg /mL) infusion 30 mg/hr (06/01/21 0700)  ? heparin 2,200 Units/hr (06/01/21 0700)  ? milrinone 0.5 mcg/kg/min (06/01/21 0700)  ? norepinephrine (LEVOPHED) Adult infusion 18 mcg/min (06/01/21 0700)  ? vancomycin    ? ? ?PRN Medications: ?sodium chloride, sodium chloride, acetaminophen, albuterol, ALPRAZolam, alum & mag hydroxide-simeth, azelastine, gabapentin, hyoscyamine, loratadine, nitroGLYCERIN, ondansetron, simethicone, sodium chloride flush, sodium chloride flush, traMADol ? ? ? ?Patient Profile  ? ?[name] is a 36 year old with end stage HFrEF, on home milrinnone, NICM, HTN, asthma, OSA, obesity and DMII.  ?  ?Admitted with A/C End Stage HFrEF.  ? ?Assessment/Plan  ? ?1. A/C End Stage HFrEF , NICM -->Cardiogenic Shock ?- EF has been down for some time.   Echo 10/2020 EF < 20% and moderate RV dysfunction. He has been on home milrinone 0.375 since 9/22.  Not thought to be candidate for transplant d/t obesity and noncompliance. No VAD due to severe RV dysfunction (CVP 37 on cath) Echo in 1/23 reviewed, shows EF < 20%, mild LV dilation, severe RV dysfunction with moderate RV enlargement.  ?- On home milrinone 0.375 mcg.  ?- He is critically ill with severe biventricular HF. Has end-stage HF at baseline on home milrinone. He was tipped over by AFL. -> s/p DC-CV on 4/14 ?- Now with IABP, NE 18 milrinone 0.5. Hemodynamics remain very tenuous ?- Massively volume overloaded with CVP 23. However, diuresing well with lasix gtt at 30/hr. Continue.  ?- No BB with low output ?- Off digoxin d/t AKI an  elevated level ?- Home spiro, bidil and entresto stopped d/t AKI and shock.Back on spiro now.  ?- No SGLT2i due to fungal infections.  ?- Long talk about tenuous prognosis with Hospice team at bedside. He wants to continue to push for now. He realizes he may not recover.  ?  ?2. DMII ?- sugars high. Continue SSI  ?- A1c 8.8 ?  ?3. AKI on CKD Stage IIIb  ?- Creatinine baseline recently 1.1-1.3 with recent AKIs ?- Creatinine 1.5>1.8>2.44>2.36 -> 2.11 -> 1.98 -> 2.18  in setting of cardiogenic shock ?- continue hemodynamic support ?  ?4. PAF--->A Flutter RVR  ?-  s/p DC-CV on 4/14. Remains in NSR  ?- On amio gtt. continue ?- Continue heparin gtt ?  ?5. OSA: Severe AHI 65 ?- CPAP ?  ?6. Morbid obesity  ?- He was supposed to start [facility] but Medicaid denied it.  ?- He has continued on Trulicity.  ?- Body mass index is 47.41 kg/m?. ?  ?7. Acute Hypoxic Respiratory Failure ?-Decompensated with sats in 70s.  ?-O2 sats now stable on 4L [place] ? ?8. Hyponatremia ?- Na 127 ?- Monitor. Restrict FW. ? ?9. Hypokalemia ?- K 3.8 ?- Supp aggressively with diuresis ? ?10. Leukocytosis ?- WBCs 10.1>11.7>13.6.  ?- AF ?- Treat empirically with course of vancomycin and cefepime ?- check BC X 2, proalcitonin, CXR ? ?11. GOC  ?-Active with [facility] ?-Patient wants to be full code at this time. Discussed this admit. ? ? ? ? ?Length of Stay: 6 ? ?FINCH, LINDSAY N, PA-C  ?06/01/2021, 7:58 AM ? ?Advanced Heart Failure Team ?Pager [phone] (M-F; 7a - 5p)  ?Please contact [facility] for night-coverage after hours (5p -7a ) and weekends on [email or web address] ? ?Agree with above.  ? ?Remains on NE, milrinone and IABP. Output still low. But making urine and Scr improving.  ? ?Remains in NSR on IV amio. WBC rising. No fevers CVP ~20 ? ?Butt hurts. No CP or SOB.  ? ?General:  Lying in bed. No resp difficulty ?HEENT: normal ?Neck: supple. RIJ swan  Carotids 2+ bilat; no bruits. No lymphadenopathy or thryomegaly appreciated. ?Cor: PMI nondisplaced. Regular  rate & rhythm. No rubs, gallops or murmurs. ?Lungs: clear ?Abdomen: obese soft, nontender, nondistended. No hepatosplenomegaly. No bruits or masses. Good bowel sounds. ?Extremities: no cyanosis, clubbi

## 2021-06-01 NOTE — TOC CM/SW Note (Addendum)
Pt active is active with [facility] for Home Milrinone. If dc via ambulance, pt will need to [facility] as pt is Hospice patient. Isidoro Donning RN3 CCM, Heart Failure TOC CM [phone]  ?  ?

## 2021-06-02 DIAGNOSIS — E1142 Type 2 diabetes mellitus with diabetic polyneuropathy: Secondary | ICD-10-CM | POA: Diagnosis not present

## 2021-06-02 DIAGNOSIS — I5022 Chronic systolic (congestive) heart failure: Secondary | ICD-10-CM | POA: Diagnosis not present

## 2021-06-02 DIAGNOSIS — F32A Depression, unspecified: Secondary | ICD-10-CM | POA: Diagnosis not present

## 2021-06-02 DIAGNOSIS — Z9981 Dependence on supplemental oxygen: Secondary | ICD-10-CM | POA: Diagnosis not present

## 2021-06-02 DIAGNOSIS — J45909 Unspecified asthma, uncomplicated: Secondary | ICD-10-CM | POA: Diagnosis not present

## 2021-06-02 DIAGNOSIS — I25119 Atherosclerotic heart disease of native coronary artery with unspecified angina pectoris: Secondary | ICD-10-CM | POA: Diagnosis not present

## 2021-06-02 DIAGNOSIS — T80212D Local infection due to central venous catheter, subsequent encounter: Secondary | ICD-10-CM | POA: Diagnosis not present

## 2021-06-02 DIAGNOSIS — E669 Obesity, unspecified: Secondary | ICD-10-CM | POA: Diagnosis not present

## 2021-06-02 DIAGNOSIS — I130 Hypertensive heart and chronic kidney disease with heart failure and stage 1 through stage 4 chronic kidney disease, or unspecified chronic kidney disease: Secondary | ICD-10-CM | POA: Diagnosis not present

## 2021-06-02 DIAGNOSIS — J302 Other seasonal allergic rhinitis: Secondary | ICD-10-CM | POA: Diagnosis not present

## 2021-06-02 DIAGNOSIS — L304 Erythema intertrigo: Secondary | ICD-10-CM | POA: Diagnosis not present

## 2021-06-02 DIAGNOSIS — Z794 Long term (current) use of insulin: Secondary | ICD-10-CM | POA: Diagnosis not present

## 2021-06-02 DIAGNOSIS — I5084 End stage heart failure: Secondary | ICD-10-CM | POA: Diagnosis not present

## 2021-06-02 DIAGNOSIS — M545 Low back pain, unspecified: Secondary | ICD-10-CM | POA: Diagnosis not present

## 2021-06-02 DIAGNOSIS — R570 Cardiogenic shock: Secondary | ICD-10-CM | POA: Diagnosis not present

## 2021-06-02 DIAGNOSIS — J9691 Respiratory failure, unspecified with hypoxia: Secondary | ICD-10-CM | POA: Diagnosis not present

## 2021-06-02 DIAGNOSIS — I4892 Unspecified atrial flutter: Secondary | ICD-10-CM | POA: Diagnosis not present

## 2021-06-02 DIAGNOSIS — G4733 Obstructive sleep apnea (adult) (pediatric): Secondary | ICD-10-CM | POA: Diagnosis not present

## 2021-06-02 DIAGNOSIS — F419 Anxiety disorder, unspecified: Secondary | ICD-10-CM | POA: Diagnosis not present

## 2021-06-02 DIAGNOSIS — M14672 Charcot's joint, left ankle and foot: Secondary | ICD-10-CM | POA: Diagnosis not present

## 2021-06-02 LAB — BASIC METABOLIC PANEL
Anion gap: 11 (ref 5–15)
Anion gap: 14 (ref 5–15)
BUN: 51 mg/dL — ABNORMAL HIGH (ref 6–20)
BUN: 48 mg/dL — ABNORMAL HIGH (ref 6–20)
CO2: 31 mmol/L (ref 22–32)
CO2: 26 mmol/L (ref 22–32)
Calcium: 9 mg/dL (ref 8.9–10.3)
Calcium: 8.9 mg/dL (ref 8.9–10.3)
Chloride: 84 mmol/L — ABNORMAL LOW (ref 98–111)
Chloride: 90 mmol/L — ABNORMAL LOW (ref 98–111)
Creatinine, Ser: 1.6 mg/dL — ABNORMAL HIGH (ref 0.61–1.24)
Creatinine, Ser: 1.53 mg/dL — ABNORMAL HIGH (ref 0.61–1.24)
GFR, Estimated: 57 mL/min — ABNORMAL LOW (ref >60)
GFR, Estimated: >60 mL/min (ref >60)
Glucose, Bld: 170 mg/dL — ABNORMAL HIGH (ref 70–99)
Glucose, Bld: 207 mg/dL — ABNORMAL HIGH (ref 70–99)
Potassium: 3.6 mmol/L (ref 3.5–5.1)
Potassium: 3.8 mmol/L (ref 3.5–5.1)
Sodium: 126 mmol/L — ABNORMAL LOW (ref 135–145)
Sodium: 130 mmol/L — ABNORMAL LOW (ref 135–145)

## 2021-06-02 LAB — CBC
HCT: 35.5 % — ABNORMAL LOW (ref 39.0–52.0)
Hemoglobin: 11.4 g/dL — ABNORMAL LOW (ref 13.0–17.0)
MCH: 29.3 pg (ref 26.0–34.0)
MCHC: 32.1 g/dL (ref 30.0–36.0)
MCV: 91.3 fL (ref 80.0–100.0)
Platelets: 169 10*3/uL (ref 150–400)
RBC: 3.89 MIL/uL — ABNORMAL LOW (ref 4.22–5.81)
RDW: 16.3 % — ABNORMAL HIGH (ref 11.5–15.5)
WBC: 13.1 10*3/uL — ABNORMAL HIGH (ref 4.0–10.5)
nRBC: 0 % (ref 0.0–0.2)

## 2021-06-02 LAB — APTT: aPTT: 78 seconds — ABNORMAL HIGH (ref 24–36)

## 2021-06-02 LAB — COOXEMETRY PANEL
Carboxyhemoglobin: 1.4 % (ref 0.5–1.5)
Carboxyhemoglobin: 1.8 % — ABNORMAL HIGH (ref 0.5–1.5)
Methemoglobin: <0.7 % (ref 0.0–1.5)
Methemoglobin: <0.7 % (ref 0.0–1.5)
O2 Saturation: 47.9 %
O2 Saturation: 46.7 %
Total hemoglobin: 12 g/dL (ref 12.0–16.0)
Total hemoglobin: 11.9 g/dL — ABNORMAL LOW (ref 12.0–16.0)

## 2021-06-02 LAB — HEPARIN LEVEL (UNFRACTIONATED): Heparin Unfractionated: 0.56 IU/mL (ref 0.30–0.70)

## 2021-06-02 LAB — GLUCOSE, CAPILLARY
Glucose-Capillary: 143 mg/dL — ABNORMAL HIGH (ref 70–99)
Glucose-Capillary: 163 mg/dL — ABNORMAL HIGH (ref 70–99)
Glucose-Capillary: 133 mg/dL — ABNORMAL HIGH (ref 70–99)
Glucose-Capillary: 157 mg/dL — ABNORMAL HIGH (ref 70–99)

## 2021-06-02 LAB — PROCALCITONIN: Procalcitonin: 0.39 ng/mL

## 2021-06-02 MED ORDER — SORBITOL 70 % SOLN
60.0000 mL | Freq: Once | Status: AC
  Administered 2021-06-02: 60 mL via ORAL
  Filled 2021-06-02: qty 60

## 2021-06-02 MED ORDER — POTASSIUM CHLORIDE CRYS ER 20 MEQ PO TBCR
40.0000 meq | EXTENDED_RELEASE_TABLET | Freq: Once | ORAL | Status: AC
  Administered 2021-06-02: 40 meq via ORAL
  Filled 2021-06-02: qty 2

## 2021-06-02 MED ORDER — METOCLOPRAMIDE HCL 5 MG/ML IJ SOLN
10.0000 mg | Freq: Four times a day (QID) | INTRAMUSCULAR | Status: AC
  Administered 2021-06-02 (×4): 10 mg via INTRAVENOUS
  Filled 2021-06-02 (×4): qty 2

## 2021-06-02 MED ORDER — VANCOMYCIN HCL 1250 MG/250ML IV SOLN
1250.0000 mg | Freq: Two times a day (BID) | INTRAVENOUS | Status: DC
  Administered 2021-06-02 – 2021-06-03 (×4): 1250 mg via INTRAVENOUS
  Filled 2021-06-02 (×5): qty 250

## 2021-06-02 MED ORDER — INSULIN DETEMIR 100 UNIT/ML ~~LOC~~ SOLN
28.0000 [IU] | Freq: Two times a day (BID) | SUBCUTANEOUS | Status: DC
  Administered 2021-06-02 – 2021-06-04 (×5): 28 [IU] via SUBCUTANEOUS
  Filled 2021-06-02 (×6): qty 0.28

## 2021-06-02 NOTE — Progress Notes (Addendum)
ANTICOAGULATION CONSULT NOTE - Follow Up Consult ? ?Pharmacy Consult for heparin ?Indication:  IABP and Afib ?Heparin dosing wt: 171 kg ? ?Labs: ?Recent Labs  ?  05/31/21 ?0458 05/31/21 ?1329 05/31/21 ?1555 06/01/21 ?0417 06/01/21 ?1430 06/02/21 ?0502  ?HGB 11.7*  --   --  11.5*  --  11.4*  ?HCT 35.3*  --   --  36.1*  --  35.5*  ?PLT 202  --   --  186  --  169  ?APTT 83* 78*  --  79*  --  78*  ?HEPARINUNFRC 0.97*  --   --  0.68  --  0.56  ?CREATININE 2.18*  --    < > 2.00* 1.79* 1.60*  ? < > = values in this interval not displayed.  ? ? ? ?Assessment: ?36yo male with cardiogenic shock and atrial fibrillation s/p IABP. Patient received apixaban 4/11-4/13 for atrial fibrillation. Pharmacy consulted for heparin.   ? ?aPTT is therapeutic at 78 seconds on heparin at 2,200 units/hr. Heparin level 0.56 close to correlating with aPTT and expect to correlate tomorrow. CBC stable. No s/sx of bleeding or issues with infusion per RN.  ? ?Goal of Therapy:  ?Heparin level goal 0.3-0.5 ?aPTT 66-85 seconds ?Monitor platelets per anticoagulation protocol: Yes  ?  ?Plan:  ?Continue heparin infusion at 2,200 units/hr.   ?Daily HL and CBC. Will no longer follow aPTT.  ?Monitor for s/sx of bleeding. ? ?Eliseo Gum, PharmD Candidate  ?06/02/2021 8:06 AM  ? ?Please check [email or web address] for unit-specific pharmacy phone numbers. ? ? ?

## 2021-06-02 NOTE — Progress Notes (Addendum)
? ? Advanced Heart Failure Rounding Note ? ?PCP-Cardiologist: Fransico Him, MD  ? ?Subjective:   ? ?04/11: Admitted with volume overload/CGS precipitated by AFL with RVR. On home milrinone 0.375 mcg. Started on lasix drip.  ?04/13: Milrinone increased to 0.5, NE added. IABP and SWAN placed d/t ongoing shock. ?04/14: AFL >> S/p DC-CV to SR ? ?CO-OX 48% on milrinone 0.5 + 17 NE + IABP at 1:1 ? ?Scr trending down, 1.6 today. 8.3L UOP yesterday with lasix gtt at 30/hr. ? ?On empiric abx with vancomycin + cefepime. AF. Procalcitonin 0.41>0.39. ? ?His neck hurts. Not wearing CPAP at night, makes his mouth dry. ? ? ?SWAN #s ?PAP: (49-71)/(27-46) 58/31 ?CVP:  [18 mmHg-31 mmHg] 20 mmHg ?CO:  [4 L/min-4.5 L/min] 4 L/min ?CI:  [1.4 L/min/m2-1.6 L/min/m2] 1.4 L/min/m2 ? ? ?Objective:   ?Weight Range: ?(!) 169 kg ?Body mass index is 47.84 kg/m?.  ? ?Vital Signs:   ?Temp:  [98.1 ?F (36.7 ?C)-99.9 ?F (37.7 ?C)] 98.2 ?F (36.8 ?C) (04/18 0700) ?Pulse Rate:  [97-208] 106 (04/18 0430) ?Resp:  [16-39] 20 (04/18 0700) ?BP: (108-138)/(72-99) 112/86 (04/18 0700) ?SpO2:  [88 %-100 %] 92 % (04/18 0700) ?Arterial Line BP: (105-129)/(84-111) 109/84 (04/18 0700) ?Weight:  [169 kg] 169 kg (04/18 0500) ?Last BM Date : 05/28/21 ? ?Weight change: ?Filed Weights  ? 05/31/21 0615 06/01/21 0630 06/02/21 0500  ?Weight: (!) 168.5 kg (!) 167.5 kg (!) 169 kg  ? ? ?Intake/Output:  ? ?Intake/Output Summary (Last 24 hours) at 06/02/2021 0751 ?Last data filed at 06/02/2021 0700 ?Gross per 24 hour  ?Intake 4303.64 ml  ?Output 8185 ml  ?Net -3881.36 ml  ?  ? ? ?Physical Exam  ?CVP 20 ?General:  Fatigued appearing. No distress. Lying in bed.  ?HEENT: normal ?Neck: supple. no JVD. Carotids 2+ bilat; no bruits. R IJ swan. ?Cor: PMI nondisplaced. Regular rate & rhythm. No rubs, gallops or murmurs. ?Lungs: clear anteriorly ?Abdomen: soft, nontender, nondistended.  ?Extremities: no cyanosis, clubbing, rash, 1+ edema, + RFA IABP ?Neuro: alert & orientedx3, cranial  nerves grossly intact. moves all 4 extremities w/o difficulty. Affect pleasant ? ? ? ?Telemetry  ? ?Sinus 90s-100s, rare PVCs (personally reviewed) ? ?Labs  ?  ?CBC ?Recent Labs  ?  06/01/21 ?0417 06/02/21 ?0502  ?WBC 13.6* 13.1*  ?HGB 11.5* 11.4*  ?HCT 36.1* 35.5*  ?MCV 91.9 91.3  ?PLT 186 169  ? ?Basic Metabolic Panel ?Recent Labs  ?  06/01/21 ?0417 06/01/21 ?1430 06/02/21 ?0502  ?NA 127* 126* 126*  ?K 3.8 3.8 3.6  ?CL 86* 87* 84*  ?CO2 31 25 31   ?GLUCOSE 253* 262* 170*  ?BUN 57* 55* 51*  ?CREATININE 2.00* 1.79* 1.60*  ?CALCIUM 9.0 9.0 9.0  ?MG 2.2  --   --   ? ?Liver Function Tests ?No results for input(s): AST, ALT, ALKPHOS, BILITOT, PROT, ALBUMIN in the last 72 hours. ? ?No results for input(s): LIPASE, AMYLASE in the last 72 hours. ? ?Cardiac Enzymes ?No results for input(s): CKTOTAL, CKMB, CKMBINDEX, TROPONINI in the last 72 hours. ? ?BNP: ?BNP (last 3 results) ?Recent Labs  ?  03/12/21 ?1505 04/05/21 ?0148 05/26/21 ?1813  ?BNP 448.3* 460.5* 342.4*  ? ? ?ProBNP (last 3 results) ?No results for input(s): PROBNP in the last 8760 hours. ? ? ?D-Dimer ?No results for input(s): DDIMER in the last 72 hours. ?Hemoglobin A1C ?No results for input(s): HGBA1C in the last 72 hours. ? ?Fasting Lipid Panel ?No results for input(s): CHOL, HDL, LDLCALC, TRIG, CHOLHDL,  LDLDIRECT in the last 72 hours. ?Thyroid Function Tests ?No results for input(s): TSH, T4TOTAL, T3FREE, THYROIDAB in the last 72 hours. ? ?Invalid input(s): FREET3 ? ?Other results: ? ? ?Imaging  ? ? ?DG CHEST PORT 1 VIEW ? ?Result Date: 06/01/2021 ?CLINICAL DATA:  Cough EXAM: PORTABLE CHEST 1 VIEW COMPARISON:  05/30/2021 FINDINGS: Intra-aortic balloon pump is unchanged in position. Right internal jugular Swan-Ganz catheter tip at proximal right pulmonary artery. Midline trachea. Cardiomegaly accentuated by AP portable technique. No pleural effusion or pneumothorax. Low lung volumes. This accentuates pulmonary venous congestion. No overt congestive failure. No  lobar consolidation. IMPRESSION: Cardiomegaly and mild pulmonary venous congestion, accentuated by low lung volumes. Electronically Signed   By: Abigail Miyamoto M.D.   On: 06/01/2021 13:26   ? ? ?Medications:   ? ? ?Scheduled Medications: ? Chlorhexidine Gluconate Cloth  6 each Topical Daily  ? Dulaglutide  1.5 mg Subcutaneous Weekly  ? insulin aspart  0-20 Units Subcutaneous TID WC  ? insulin aspart  0-5 Units Subcutaneous QHS  ? insulin aspart  2 Units Subcutaneous TID WC  ? insulin detemir  25 Units Subcutaneous BID  ? pantoprazole  40 mg Oral Daily  ? polyethylene glycol  17 g Oral BID  ? potassium chloride  40 mEq Oral Once  ? senna-docusate  2 tablet Oral BID  ? sodium chloride flush  10-40 mL Intracatheter Q12H  ? sodium chloride flush  3 mL Intravenous Q12H  ? ? ?Infusions: ? sodium chloride    ? sodium chloride    ? sodium chloride 10 mL/hr at 06/01/21 0831  ? sodium chloride    ? amiodarone 30 mg/hr (06/02/21 0700)  ? ceFEPime (MAXIPIME) IV Stopped (06/02/21 [id number])  ? furosemide (LASIX) 200 mg in dextrose 5% 100 mL (2mg /mL) infusion 30 mg/hr (06/02/21 0700)  ? heparin 2,200 Units/hr (06/02/21 0700)  ? milrinone 0.5 mcg/kg/min (06/02/21 0700)  ? norepinephrine (LEVOPHED) Adult infusion 17 mcg/min (06/02/21 0700)  ? vancomycin    ? ? ?PRN Medications: ?sodium chloride, sodium chloride, acetaminophen, albuterol, ALPRAZolam, alum & mag hydroxide-simeth, azelastine, gabapentin, hyoscyamine, loratadine, nitroGLYCERIN, ondansetron, simethicone, sodium chloride flush, sodium chloride flush, traMADol ? ? ? ?Patient Profile  ? ?[name] is a 36 year old with end stage HFrEF, on home milrinnone, NICM, HTN, asthma, OSA, obesity and DMII.  ?  ?Admitted with A/C End Stage HFrEF.  ? ?Assessment/Plan  ? ?1. A/C End Stage HFrEF , NICM -->Cardiogenic Shock ?- EF has been down for some time.   Echo 10/2020 EF < 20% and moderate RV dysfunction. He has been on home milrinone 0.375 since 9/22.  Not thought to be candidate for  transplant d/t obesity and noncompliance. No VAD due to severe RV dysfunction (CVP 37 on cath) Echo in 1/23 reviewed, shows EF < 20%, mild LV dilation, severe RV dysfunction with moderate RV enlargement.  ?- On home milrinone 0.375 mcg.  ?- He is critically ill with severe biventricular HF. Has end-stage HF at baseline on home milrinone. He was tipped over by AFL. -> s/p DC-CV on 4/14 ?- Now with IABP, NE 17 milrinone 0.5. Hemodynamics remain very tenuous. CO-OX 48%, CI 1.4 this am. Increase milrinone to 0.6. ?- Massively volume overloaded but diuresing well. CVP 20. Continue lasix gtt at 20/hr ?- No BB with low output ?- Off digoxin d/t AKI an elevated level ?- Home spiro, bidil and entresto stopped d/t AKI and shock. Back on spiro now.  ?- No SGLT2i due to fungal  infections.  ?- Dr. Haroldine Laws had long discussion with patient, his father and s/o about prognosis. He is diuresing but remains in shock despite IABP and inotrope support. Not a candidate for VAD or transplant. Options include continuing with current treatment this week and see if his hemodynamics improve or transition to home with hospice. Patient and his family will discuss options and let us know when they make a decision.  ?- Hospice team following. Active with [facility]. ?  ?2. DMII ?- sugars high. Continue SSI  ?- A1c 8.8 ?  ?3. AKI on CKD Stage IIIb  ?- Creatinine baseline recently 1.1-1.3 with recent AKIs ?- Creatinine 1.5>1.8>2.44>2.36 -> 2.11 -> 1.98 -> 2.18 -> 2.0 > 1.8 > 1.6  in setting of cardiogenic shock ?- continue hemodynamic support and diuresis ?  ?4. PAF--->A Flutter RVR  ?- s/p DC-CV on 4/14. Remains in NSR  ?- On amio gtt. continue ?- Continue heparin gtt ?  ?5. OSA: Severe AHI 65 ?- Not wearing CPAP ?  ?6. Morbid obesity  ?- He was supposed to start [facility] but Medicaid denied it.  ?- He has continued on Trulicity.  ?- Body mass index is 47.84 kg/m?. ?  ?7. Acute Hypoxic Respiratory Failure ?-Decompensated with sats in 70s.   ?-O2 sats now stable on 4L [place] ? ?8. Hyponatremia ?- Na 126 ?- Tolvaptan if drops below 125 ?- BMET this afternoon ?- Monitor. Restrict FW. ? ?9. Hypokalemia ?- K 3.6 ?- Supp aggressively with diuresis ? ?10. Leu

## 2021-06-02 NOTE — Progress Notes (Signed)
Pharmacy Antibiotic Note ? ?Jonathan [name] is a 36 y.o. male admitted on 05/26/2021 with concern for PNA.  Pharmacy has been consulted for vancomycin dosing. Patient is afebrile with leukocytosis. Renal function has improved overnight.  ? ?Plan: ?Adjust vancomycin to 1,250 mg IV Q12h (eAUC 448.3) ?Cefepime 2 g IV Q8h  ?Monitor CBC and renal function daily  ?Obtain vancomycin levels at steady state   ? ?Height: 6\' 2"  (188 cm) ?Weight: (!) 169 kg (372 lb 9.2 oz) ?IBW/kg (Calculated) : 82.2 ? ?Temp (24hrs), Avg:98.8 ?F (37.1 ?C), Min:98.1 ?F (36.7 ?C), Max:99.9 ?F (37.7 ?C) ? ?Recent Labs  ?Lab 05/28/21 ?0808 05/28/21 ?0953 05/28/21 ?2243 05/29/21 ?05/31/21 05/29/21 ?1352 05/30/21 ?0510 05/30/21 ?1200 05/31/21 ?0458 05/31/21 ?1555 06/01/21 ?0417 06/01/21 ?1430 06/02/21 ?0502  ?WBC  --    < > 8.7 8.4  --  10.1  --  11.7*  --  13.6*  --  13.1*  ?CREATININE  --   --  2.44* 2.36*   < > 2.11*   < > 2.18* 1.90* 2.00* 1.79* 1.60*  ?LATICACIDVEN 2.2*  --  1.5  --   --   --   --   --   --   --   --   --   ? < > = values in this interval not displayed.  ? ?  ?Estimated Creatinine Clearance: 105.5 mL/min (A) (by C-G formula based on SCr of 1.6 mg/dL (H)).   ? ?Allergies  ?Allergen Reactions  ? Hydrocodone   ?  Hives   ? ?Antimicrobials this admission: ?4/17 vancomycin >>  ?4/17 cefepime >>  ? ?Microbiology results: ?4/12 MRSA PCR: neg ? ?Thank you for allowing pharmacy to be a part of this patient?s care. ? ?6/12, PharmD Candidate  ?06/02/2021 9:46 AM  ? ?

## 2021-06-02 NOTE — Progress Notes (Signed)
[place] 2H06 [facility]) hospitalized hospice patient. ?  ?This is a current [facility] hospice home care patient with a terminal diagnosis of hypertensive heart disease with chronic systolic CHF.  Patient contacted Dr Haroldine Laws with complaints of weight gain of > 20lbs, lower extremity edema and abdomen. Advised by Dr. Haroldine Laws to go to [facility] ED for diuresis. Patient admitted on 05/26/2021 with diagnosis of end stage CHF.  Per Dr. Tomasa Hosteller with [facility] this is a hospice related admission. ?  ?This patient is appropriate for inpatient status due to need for IV medications for symptom management.  ? ?Visited with patient and his wife. [facility] CSW and chaplain also present for visit. Patient in bed sleeping. Seemed to be aware of our presence but did not engage in conversation. Patient remains on IABP. Wife states he wants to go home Friday.  ? ?VS: 99.1, 112/86, 121, 20 94% RA  ?Art [place] BP 112/93 ?PAP 62/34, PAP M 44, CVP 21 ?I/O: 4303.07/8378 ? ?Abnormal Labs: ?06/02/21 14:49 ?Total hemoglobin: 11.9 (L) ?Carboxyhemoglobin: 1.8 (H) ?06/02/21 05:02 ?Sodium: 126 (L) ?Chloride: 84 (L) ?Glucose: 170 (H) ?BUN: 51 (H) ?Creatinine: 1.60 (H) ?06/02/21 05:02 ?GFR, Estimated: 57 (L) ?06/02/21 05:02 ?WBC: 13.1 (H) ?RBC: 3.89 (L) ?Hemoglobin: 11.4 (L) ?HCT: 35.5 (L) ?RDW: 16.3 (H) ? ?IV/PRN medications: Amiodarone 33.3 ml/hr dose 60mg  hr continuous, Lasix 66ml/30mg /hr continuous, Milrinone 29.41ml/hr Dose: 0.6 mcg/kg/min continuous, Levophed Rate: 4.69-37.5 mL/hr Dose: 5-40 mcg/min titrated GOAL:  MAP of > 65, SBP > 90. Heparin 55ml/2220units/hr, Cefepime 2g/150mg  q8hr, Maxipime 2g IVPB TID, Simethicone 80mg  PO Prn @ 0948 ? ?Diagnostics: ?CXR ?FINDINGS: ?Intra-aortic balloon pump is unchanged in position. Right internal ?jugular Swan-Ganz catheter tip at proximal right pulmonary artery. ?  ?Midline trachea. Cardiomegaly accentuated by AP portable technique. ?No pleural effusion or pneumothorax. Low lung volumes. This ?accentuates  pulmonary venous congestion. No overt congestive ?failure. No lobar consolidation. ?  ?IMPRESSION: ?Cardiomegaly and mild pulmonary venous congestion, accentuated by ?low lung volumes. ? ?Problem list: ?1. A/C End Stage HFrEF , NICM -->Cardiogenic Shock ?- EF has been down for some time.   Echo 10/2020 EF < 20% and moderate RV dysfunction. He has been on home milrinone 0.375 since 9/22.  Not thought to be candidate for transplant d/t obesity and noncompliance. No VAD due to severe RV dysfunction (CVP 37 on cath) Echo in 1/23 reviewed, shows EF < 20%, mild LV dilation, severe RV dysfunction with moderate RV enlargement.  ?- On home milrinone 0.375 mcg.  ?- He is critically ill with severe biventricular HF. Has end-stage HF at baseline on home milrinone. He was tipped over by AFL. -> s/p DC-CV on 4/14 ?- Now with IABP, NE 17 milrinone 0.5. Hemodynamics remain very tenuous. CO-OX 48%, CI 1.4 this am.  ?- Massively volume overloaded but diuresing well. CVP 20. Continue lasix gtt at 20/hr ?- No BB with low output ?- Off digoxin d/t AKI an elevated level ?- Home spiro, bidil and entresto stopped d/t AKI and shock. Back on spiro now.  ?- No SGLT2i due to fungal infections.  ?- Long talk about tenuous prognosis with Hospice team at bedside. He wants to continue to push for now. He realizes he may not recover.  ?  ?2. DMII ?- sugars high. Continue SSI  ?- A1c 8.8 ?  ?3. AKI on CKD Stage IIIb  ?- Creatinine baseline recently 1.1-1.3 with recent AKIs ?- Creatinine 1.5>1.8>2.44>2.36 -> 2.11 -> 1.98 -> 2.18 -> 2.0 > 1.8 > 1.6  in setting  of cardiogenic shock ?- continue hemodynamic support and diuresis ?  ?4. PAF--->A Flutter RVR  ?- s/p DC-CV on 4/14. Remains in NSR  ?- On amio gtt. continue ?- Continue heparin gtt ?  ?5. OSA: Severe AHI 65 ?- Not wearing CPAP ?  ?6. Morbid obesity  ?- He was supposed to start [facility] but Medicaid denied it.  ?- He has continued on Trulicity.  ?- Body mass index is 47.84 kg/m?. ?  ?7. Acute  Hypoxic Respiratory Failure ?-Decompensated with sats in 70s.  ?-O2 sats now stable on 4L [place] ?  ?8. Hyponatremia ?- Na 126 ?- Tolvaptan if drops below 125 ?- BMET this afternoon ?- Monitor. Restrict FW. ?  ?9. Hypokalemia ?- K 3.6 ?- Supp aggressively with diuresis ?  ?10. Leukocytosis ?- WBCs 10.1>11.7>13.6>13.1 ?- AF ?- Treat empirically with course of vancomycin and cefepime ?- BC X 2 pending, proalcitonin 0.41>0.39 ?- AF ?  ?11. Constipation ?- Bowel regimen discussed with PharmD ?  ?GOC  ?-Active with [facility] ?-Patient wants to be full code at this time. Discussed this admit. ? ?Family Contact: updated at bedside ?IDG: updated ?GOC: on going. Remains a full code.  ?  ?Should patient need ambulance transport at discharge please use [facility] as [facility] contracts this service with them for our active hospice patients. ? ?A Please do not hesitate to call with questions.   ? ?Thank you,   ?Farrel Gordon, RN, CCM      ?[facility] Hospital Liaison   ?[phone] ? ? ?

## 2021-06-02 NOTE — Progress Notes (Signed)
RT note- Patient placed on CPAP for the evening ?

## 2021-06-02 NOTE — Progress Notes (Signed)
?  Patient remains on IABP and multiple pressors.  ? ?CI remains low.  ? ?I had family meeting with patient, his wife and father today.  ? ?We again discussed his situation and options. ? ?He says he is tired and he wants to get his IABP out and go home on Thursday. ? ?We discussed how we would handle this. ? ?We will plan to pull IABP out tomorrow afternoon and continue NE and milrinone. We will observe overnight to make sure IABP site stable without bleeding. On Thursday we will stop NE and plan to return home on his previous milrinone dose.  ? ?I have contacted [facility] RN on call to let her know of plan.  ? ?Additional CCT 40 mins.  ? ?Glori Bickers, MD  ?9:01 PM ? ? ? ?

## 2021-06-03 DIAGNOSIS — I4892 Unspecified atrial flutter: Secondary | ICD-10-CM | POA: Diagnosis not present

## 2021-06-03 DIAGNOSIS — J302 Other seasonal allergic rhinitis: Secondary | ICD-10-CM | POA: Diagnosis not present

## 2021-06-03 DIAGNOSIS — I5022 Chronic systolic (congestive) heart failure: Secondary | ICD-10-CM | POA: Diagnosis not present

## 2021-06-03 DIAGNOSIS — T80212D Local infection due to central venous catheter, subsequent encounter: Secondary | ICD-10-CM | POA: Diagnosis not present

## 2021-06-03 DIAGNOSIS — L304 Erythema intertrigo: Secondary | ICD-10-CM | POA: Diagnosis not present

## 2021-06-03 DIAGNOSIS — M545 Low back pain, unspecified: Secondary | ICD-10-CM | POA: Diagnosis not present

## 2021-06-03 DIAGNOSIS — J9691 Respiratory failure, unspecified with hypoxia: Secondary | ICD-10-CM | POA: Diagnosis not present

## 2021-06-03 DIAGNOSIS — F32A Depression, unspecified: Secondary | ICD-10-CM | POA: Diagnosis not present

## 2021-06-03 DIAGNOSIS — E669 Obesity, unspecified: Secondary | ICD-10-CM | POA: Diagnosis not present

## 2021-06-03 DIAGNOSIS — I25119 Atherosclerotic heart disease of native coronary artery with unspecified angina pectoris: Secondary | ICD-10-CM | POA: Diagnosis not present

## 2021-06-03 DIAGNOSIS — J45909 Unspecified asthma, uncomplicated: Secondary | ICD-10-CM | POA: Diagnosis not present

## 2021-06-03 DIAGNOSIS — F419 Anxiety disorder, unspecified: Secondary | ICD-10-CM | POA: Diagnosis not present

## 2021-06-03 DIAGNOSIS — Z794 Long term (current) use of insulin: Secondary | ICD-10-CM | POA: Diagnosis not present

## 2021-06-03 DIAGNOSIS — Z9981 Dependence on supplemental oxygen: Secondary | ICD-10-CM | POA: Diagnosis not present

## 2021-06-03 DIAGNOSIS — R570 Cardiogenic shock: Secondary | ICD-10-CM | POA: Diagnosis not present

## 2021-06-03 DIAGNOSIS — G4733 Obstructive sleep apnea (adult) (pediatric): Secondary | ICD-10-CM | POA: Diagnosis not present

## 2021-06-03 DIAGNOSIS — I130 Hypertensive heart and chronic kidney disease with heart failure and stage 1 through stage 4 chronic kidney disease, or unspecified chronic kidney disease: Secondary | ICD-10-CM | POA: Diagnosis not present

## 2021-06-03 DIAGNOSIS — E1142 Type 2 diabetes mellitus with diabetic polyneuropathy: Secondary | ICD-10-CM | POA: Diagnosis not present

## 2021-06-03 DIAGNOSIS — M14672 Charcot's joint, left ankle and foot: Secondary | ICD-10-CM | POA: Diagnosis not present

## 2021-06-03 LAB — POCT ACTIVATED CLOTTING TIME: Activated Clotting Time: 149 seconds

## 2021-06-03 LAB — CBC
HCT: 35.2 % — ABNORMAL LOW (ref 39.0–52.0)
HCT: 33.6 % — ABNORMAL LOW (ref 39.0–52.0)
Hemoglobin: 11.3 g/dL — ABNORMAL LOW (ref 13.0–17.0)
Hemoglobin: 10.9 g/dL — ABNORMAL LOW (ref 13.0–17.0)
MCH: 29 pg (ref 26.0–34.0)
MCH: 29.2 pg (ref 26.0–34.0)
MCHC: 32.1 g/dL (ref 30.0–36.0)
MCHC: 32.4 g/dL (ref 30.0–36.0)
MCV: 90.3 fL (ref 80.0–100.0)
MCV: 90.1 fL (ref 80.0–100.0)
Platelets: 158 10*3/uL (ref 150–400)
Platelets: 162 10*3/uL (ref 150–400)
RBC: 3.9 MIL/uL — ABNORMAL LOW (ref 4.22–5.81)
RBC: 3.73 MIL/uL — ABNORMAL LOW (ref 4.22–5.81)
RDW: 16.3 % — ABNORMAL HIGH (ref 11.5–15.5)
RDW: 16.2 % — ABNORMAL HIGH (ref 11.5–15.5)
WBC: 12.6 10*3/uL — ABNORMAL HIGH (ref 4.0–10.5)
WBC: 10.6 10*3/uL — ABNORMAL HIGH (ref 4.0–10.5)
nRBC: 0 % (ref 0.0–0.2)
nRBC: 0 % (ref 0.0–0.2)

## 2021-06-03 LAB — BASIC METABOLIC PANEL
Anion gap: 11 (ref 5–15)
Anion gap: 13 (ref 5–15)
BUN: 49 mg/dL — ABNORMAL HIGH (ref 6–20)
BUN: 50 mg/dL — ABNORMAL HIGH (ref 6–20)
CO2: 28 mmol/L (ref 22–32)
CO2: 26 mmol/L (ref 22–32)
Calcium: 9.1 mg/dL (ref 8.9–10.3)
Calcium: 8.9 mg/dL (ref 8.9–10.3)
Chloride: 86 mmol/L — ABNORMAL LOW (ref 98–111)
Chloride: 86 mmol/L — ABNORMAL LOW (ref 98–111)
Creatinine, Ser: 1.64 mg/dL — ABNORMAL HIGH (ref 0.61–1.24)
Creatinine, Ser: 1.65 mg/dL — ABNORMAL HIGH (ref 0.61–1.24)
GFR, Estimated: 55 mL/min — ABNORMAL LOW (ref >60)
GFR, Estimated: 55 mL/min — ABNORMAL LOW (ref >60)
Glucose, Bld: 154 mg/dL — ABNORMAL HIGH (ref 70–99)
Glucose, Bld: 203 mg/dL — ABNORMAL HIGH (ref 70–99)
Potassium: 3.9 mmol/L (ref 3.5–5.1)
Potassium: 4 mmol/L (ref 3.5–5.1)
Sodium: 125 mmol/L — ABNORMAL LOW (ref 135–145)
Sodium: 125 mmol/L — ABNORMAL LOW (ref 135–145)

## 2021-06-03 LAB — GLUCOSE, CAPILLARY
Glucose-Capillary: 121 mg/dL — ABNORMAL HIGH (ref 70–99)
Glucose-Capillary: 242 mg/dL — ABNORMAL HIGH (ref 70–99)
Glucose-Capillary: 217 mg/dL — ABNORMAL HIGH (ref 70–99)
Glucose-Capillary: 227 mg/dL — ABNORMAL HIGH (ref 70–99)

## 2021-06-03 LAB — COOXEMETRY PANEL
Carboxyhemoglobin: 1.3 % (ref 0.5–1.5)
Methemoglobin: <0.7 % (ref 0.0–1.5)
O2 Saturation: 43.9 %
Total hemoglobin: 11.9 g/dL — ABNORMAL LOW (ref 12.0–16.0)

## 2021-06-03 LAB — PROCALCITONIN: Procalcitonin: 0.54 ng/mL

## 2021-06-03 LAB — HEPARIN LEVEL (UNFRACTIONATED): Heparin Unfractionated: 0.55 IU/mL (ref 0.30–0.70)

## 2021-06-03 MED ORDER — LIDOCAINE HCL (PF) 1 % IJ SOLN
INTRAMUSCULAR | Status: AC
  Filled 2021-06-03: qty 30

## 2021-06-03 MED ORDER — METOLAZONE 5 MG PO TABS
5.0000 mg | ORAL_TABLET | Freq: Once | ORAL | Status: AC
  Administered 2021-06-03: 5 mg via ORAL
  Filled 2021-06-03: qty 1

## 2021-06-03 MED ORDER — METOCLOPRAMIDE HCL 5 MG/ML IJ SOLN
10.0000 mg | Freq: Four times a day (QID) | INTRAMUSCULAR | Status: AC
  Administered 2021-06-03 (×3): 10 mg via INTRAVENOUS
  Filled 2021-06-03 (×3): qty 2

## 2021-06-03 MED ORDER — POTASSIUM CHLORIDE CRYS ER 10 MEQ PO TBCR
40.0000 meq | EXTENDED_RELEASE_TABLET | Freq: Once | ORAL | Status: AC
  Administered 2021-06-03: 40 meq via ORAL
  Filled 2021-06-03: qty 4

## 2021-06-03 MED ORDER — SORBITOL 70 % SOLN
60.0000 mL | Freq: Once | Status: AC
  Administered 2021-06-03: 60 mL via ORAL
  Filled 2021-06-03: qty 60

## 2021-06-03 NOTE — TOC Progression Note (Signed)
Transition of Care (TOC) - Progression Note  ? ? ?Patient Details  ?Name: Jonathan [name] ?MRN: [id number] ?Date of Birth: 15-Nov-1985 ? ?Transition of Care (TOC) CM/SW Contact  ?Lawerance Sabal, RN ?Phone Number: ?06/03/2021, 2:08 PM ? ?Clinical Narrative:    ?Per rounds and discussion with bedside nurse, plan for today is to Dc IABP, afterwards plan is tentative for patient to DC back home continuing home hospice care, with [facility].  ?Spoke with liaison from [facility] to notify of plans DC tomorrow pending removal of IABP and resuming home milrinone infusion. [facility] is aware that patient is a FULL code. ?Spoke w Elita Quick, [facility] who will provide milrinone for DC and medication will be mixed and hooked up to patient on day of DC prior to leaving hospital. CM assigned tomorrow will need to notify Pam with confirmed plans for DC.  ? ? ?Expected Discharge Plan: Home w Hospice Care ?Barriers to Discharge: Continued Medical Work up ? ?Expected Discharge Plan and Services ?Expected Discharge Plan: Home w Hospice Care ?  ?Discharge Planning Services: CM Consult ?Post Acute Care Choice: Hospice ?Living arrangements for the past 2 months: Single Family Home ?                ?  ?  ?  ?  ?  ?HH Arranged: RN ?HH Agency: [facility] ?Date HH Agency Contacted: 05/28/21 ?Time HH Agency Contacted: 1501 ?Representative spoke with at [facility] Agency: Corrie Dandy ? ? ?Social Determinants of Health (SDOH) Interventions ?  ? ?Readmission Risk Interventions ?   ? View : No data to display.  ?  ?  ?  ? ? ?

## 2021-06-03 NOTE — Progress Notes (Signed)
[place] 2H06 [facility]) hospitalized hospice patient. ?  ?This is a current [facility] hospice home care patient with a terminal diagnosis of hypertensive heart disease with chronic systolic CHF.  Patient contacted Dr Haroldine Laws with complaints of weight gain of > 20lbs, lower extremity edema and abdomen. Advised by Dr. Haroldine Laws to go to [facility] ED for diuresis. Patient admitted on 05/26/2021 with diagnosis of end stage CHF.  Per Dr. Tomasa Hosteller with [facility] this is a hospice related admission. ?  ?This patient is appropriate for inpatient status due to need for IV medications for symptom management. ? ?Visited at bedside. Alert and oriented, resting in bed. No apparent distress. No Complaints. No family present.  ? ?VS: 98.8, 117/91, 104, 19, 95% RA ?PAP:60/34, PAP M 41 ?I/O:4056.02/5108 ? ?Abnormal labs: ?06/03/21 04:30 ?Total hemoglobin: 11.9 (L) ?Sodium: 125 (L) ?Chloride: 86 (L) ?Glucose: 154 (H) ?BUN: 49 (H) ?Creatinine: 1.64 (H) ?GFR, Estimated: 55 (L) ?06/03/21 04:30 ?WBC: 12.6 (H) ?RBC: 3.90 (L) ?Hemoglobin: 11.3 (L) ?HCT: 35.2 (L) ?RDW: 16.3 (H) ? ?IV/PRN medications: Amiodarone 16.67 ml/hr dose 30mg  hr continuous, Lasix 23ml/30mg /hr continuous, Milrinone 29.34ml/hr Dose: 0.6 mcg/kg/min continuous, Levophed Rate: 4.69-37.5 mL/hr Dose: 5-40 mcg/min titrated GOAL:  MAP of > 65, SBP > 90. Heparin 19ml/2000units/hr, Cefepime 2g/150mg  q8hr, Maxipime 2g IVPB TID ? ?Problem list:  ?1. A/C End Stage HFrEF , NICM -->Cardiogenic Shock ?- EF has been down for some time.   Echo 10/2020 EF < 20% and moderate RV dysfunction. He has been on home milrinone 0.375 since 9/22.  Not thought to be candidate for transplant d/t obesity and noncompliance. No VAD due to severe RV dysfunction (CVP 37 on cath) Echo in 1/23 reviewed, shows EF < 20%, mild LV dilation, severe RV dysfunction with moderate RV enlargement.  ?- On home milrinone 0.375 mcg.  ?- He is critically ill with severe biventricular HF. Has end-stage HF at baseline on home  milrinone. He was tipped over by AFL. -> s/p DC-CV on 4/14 ?- Now with IABP, NE 17 milrinone 0.6. Hemodynamics remain very tenuous. CO-OX 44%, CI 1.5 this am.  ?- Remains massively fluid overloaded. On lasix gtt at 30/hr. Will give 5 mg metolazone.  ?- No BB with low output ?- Off digoxin d/t AKI an elevated level ?- Home spiro, bidil and entresto stopped d/t AKI and shock. Back on spiro now.  ?- No SGLT2i due to fungal infections.  ?- Family meeting yesterday with patient, his wife and father. Patient wants balloon pump out and to go home tomorrow. Will plan to remove IABP this afternoon. Stop NE 04/20 and discharge on prior dose of milrinone.  ?- Hospice team following. Active with [facility]. ?  ?2. DMII ?- sugars high. Continue SSI  ?- A1c 8.8 ?  ?3. AKI on CKD Stage IIIb  ?- Creatinine baseline recently 1.1-1.3 with recent AKIs ?- Creatinine 1.5>1.8>2.44>2.36 -> 2.11 -> 1.98 -> 2.18 -> 2.0 > 1.8 > 1.6 > 1.5 > 1.6  in setting of cardiogenic shock ?- continue hemodynamic support and diuresis ?  ?4. PAF--->A Flutter RVR  ?- s/p DC-CV on 4/14. Remains in NSR  ?- On amio gtt. Continue for now. ?- Continue heparin gtt ?  ?5. OSA: Severe AHI 65 ?- Not wearing CPAP ?  ?6. Morbid obesity  ?- He was supposed to start [facility] but Medicaid denied it.  ?- He has continued on Trulicity.  ?- Body mass index is 48.12 kg/m?. ?  ?7. Acute Hypoxic Respiratory Failure ?-Decompensated with  sats in 70s.  ?-O2 sats now stable on 4L [place] ?  ?8. Hyponatremia ?- Na 125 ?- Tolvaptan if drops below 125 ?- Continue to diurese. ?- BMET this afternoon ?- Monitor. Restrict FW. ?  ?9. Hypokalemia ?- K 3.9 ?- Supp aggressively with diuresis ?  ?10. Leukocytosis ?- WBCs 10.1>11.7>13.6>13.1>12.6 ?- Low grade temp overnight, T max 99.46F ?- Treating empirically with course of vancomycin and cefepime ?- BC X 2 NGTD, procalcitonin 0.41>0.39>0.54 ?  ?11. Constipation ?- Give reglan and sorbitol ?  ?GOC: ?-Has home hospice through [facility].  ?-Plan  to remove IABP today and discharge tomorrow with prior home milrinone dose as above. ?-Will contact [facility] home infusion rep, Carolynn Sayers, RN ?  ? ?[place]  ?-Active with [facility] ?-Patient wants to be full code at this time. Discussed this admit. ?  ?Family Contact: by phone  ?IDG: updated ?GOC: on going. Remains a full code.  ?Discharge planning:  Return home with hospice. Coordinating with [facility] for Milrinone drip for CADD pump. Hospital bed ordered.  ?  ?Should patient need ambulance transport at discharge please use [facility] as [facility] contracts this service with them for our active hospice patients. ?  ?A Please do not hesitate to call with questions.   ?  ?Thank you,   ?Farrel Gordon, RN, CCM      ?[facility] Hospital Liaison   ?[phone] ?  ?

## 2021-06-03 NOTE — Progress Notes (Addendum)
?  ? ? Advanced Heart Failure Rounding Note ? ?PCP-Cardiologist: Fransico Him, MD  ? ?Subjective:   ? ?04/11: Admitted with volume overload/CGS precipitated by AFL with RVR. On home milrinone 0.375 mcg. Started on lasix drip.  ?04/13: Milrinone increased to 0.5, NE added. IABP and SWAN placed d/t ongoing shock. ?04/14: AFL >> S/p DC-CV to SR ? ?CO-OX 44% on Milrinone 0.6 + NE 17 + IABP 1:1 ? ?CVP 28 when personally checked ? ?5L UOP yesterday but only negative -1L d/t large volume of drips. Continues on lasix gtt at 30/hr. ? ?On empiric abx with vancomycin + cefepime. Low grade temp overnight, Tmax 99.49F. Procalcitonin 0.41>0.39>0.54. ? ?Feels okay. Not currently having any pain or dyspnea.  ? ? ? ?SWAN #s ?PAP: (51-77)/(30-54) 53/31 ?CVP:  [12 mmHg-35 mmHg] 25 mmHg ?CO:  [4.2 L/min-4.9 L/min] 4.2 L/min ?CI:  [1.5 L/min/m2-1.7 L/min/m2] 1.5 L/min/m2 ? ? ?Objective:   ?Weight Range: ?(!) 170 kg ?Body mass index is 48.12 kg/m?.  ? ?Vital Signs:   ?Temp:  [98.4 ?F (36.9 ?C)-99.9 ?F (37.7 ?C)] 99 ?F (37.2 ?C) (04/19 0700) ?Pulse Rate:  [103-229] 104 (04/19 0700) ?Resp:  [15-30] 22 (04/19 0700) ?BP: (111-125)/(85-95) 118/92 (04/19 0700) ?SpO2:  [87 %-99 %] 96 % (04/19 0700) ?Arterial Line BP: (105-124)/(82-99) 115/87 (04/19 0700) ?Weight:  [170 kg] 170 kg (04/19 0500) ?Last BM Date : 05/28/21 ? ?Weight change: ?Filed Weights  ? 06/01/21 0630 06/02/21 0500 06/03/21 0500  ?Weight: (!) 167.5 kg (!) 169 kg (!) 170 kg  ? ? ?Intake/Output:  ? ?Intake/Output Summary (Last 24 hours) at 06/03/2021 0745 ?Last data filed at 06/03/2021 0700 ?Gross per 24 hour  ?Intake 4056.11 ml  ?Output 5110 ml  ?Net -1053.89 ml  ?  ? ? ?Physical Exam  ?CVP 28 ?General:  Critically ill appearing.  ?HEENT: normal ?Neck: supple. JVP to ear. + R IJ SWAN. Carotids 2+ bilat; no bruits.  ?Cor: PMI nondisplaced. Regular rate & rhythm. No rubs, gallops or murmurs. ?Lungs: clear ?Abdomen: soft, nontender, + distended.  ?Extremities: no cyanosis, clubbing,  rash, 1 + edema, + RFA IABP ?Neuro: alert & orientedx3, cranial nerves grossly intact. moves all 4 extremities w/o difficulty. Affect pleasant ? ? ? ? ?Telemetry  ? ?Sinus 90s-100s, rare PVCs (personally reviewed) ? ?Labs  ?  ?CBC ?Recent Labs  ?  06/02/21 ?0502 06/03/21 ?0430  ?WBC 13.1* 12.6*  ?HGB 11.4* 11.3*  ?HCT 35.5* 35.2*  ?MCV 91.3 90.3  ?PLT 169 158  ? ?Basic Metabolic Panel ?Recent Labs  ?  06/01/21 ?0417 06/01/21 ?1430 06/02/21 ?1400 06/03/21 ?0430  ?NA 127*   < > 130* 125*  ?K 3.8   < > 3.8 3.9  ?CL 86*   < > 90* 86*  ?CO2 31   < > 26 28  ?GLUCOSE 253*   < > 207* 154*  ?BUN 57*   < > 48* 49*  ?CREATININE 2.00*   < > 1.53* 1.64*  ?CALCIUM 9.0   < > 8.9 9.1  ?MG 2.2  --   --   --   ? < > = values in this interval not displayed.  ? ?Liver Function Tests ?No results for input(s): AST, ALT, ALKPHOS, BILITOT, PROT, ALBUMIN in the last 72 hours. ? ?No results for input(s): LIPASE, AMYLASE in the last 72 hours. ? ?Cardiac Enzymes ?No results for input(s): CKTOTAL, CKMB, CKMBINDEX, TROPONINI in the last 72 hours. ? ?BNP: ?BNP (last 3 results) ?Recent Labs  ?  03/12/21 ?  1505 04/05/21 ?0148 05/26/21 ?1813  ?BNP 448.3* 460.5* 342.4*  ? ? ?ProBNP (last 3 results) ?No results for input(s): PROBNP in the last 8760 hours. ? ? ?D-Dimer ?No results for input(s): DDIMER in the last 72 hours. ?Hemoglobin A1C ?No results for input(s): HGBA1C in the last 72 hours. ? ?Fasting Lipid Panel ?No results for input(s): CHOL, HDL, LDLCALC, TRIG, CHOLHDL, LDLDIRECT in the last 72 hours. ?Thyroid Function Tests ?No results for input(s): TSH, T4TOTAL, T3FREE, THYROIDAB in the last 72 hours. ? ?Invalid input(s): FREET3 ? ?Other results: ? ? ?Imaging  ? ? ?No results found. ? ? ?Medications:   ? ? ?Scheduled Medications: ? Chlorhexidine Gluconate Cloth  6 each Topical Daily  ? Dulaglutide  1.5 mg Subcutaneous Weekly  ? insulin aspart  0-20 Units Subcutaneous TID WC  ? insulin aspart  0-5 Units Subcutaneous QHS  ? insulin aspart  2 Units  Subcutaneous TID WC  ? insulin detemir  28 Units Subcutaneous BID  ? metoCLOPramide (REGLAN) injection  10 mg Intravenous Q6H  ? pantoprazole  40 mg Oral Daily  ? polyethylene glycol  17 g Oral BID  ? senna-docusate  2 tablet Oral BID  ? sodium chloride flush  10-40 mL Intracatheter Q12H  ? sodium chloride flush  3 mL Intravenous Q12H  ? sorbitol  60 mL Oral Once  ? ? ?Infusions: ? sodium chloride    ? sodium chloride    ? sodium chloride 10 mL/hr at 06/01/21 0831  ? sodium chloride    ? amiodarone 30 mg/hr (06/03/21 0700)  ? ceFEPime (MAXIPIME) IV 200 mL/hr at 06/03/21 0700  ? furosemide (LASIX) 200 mg in dextrose 5% 100 mL (2mg /mL) infusion 30 mg/hr (06/03/21 0700)  ? heparin 2,200 Units/hr (06/03/21 0700)  ? milrinone 0.6 mcg/kg/min (06/03/21 0700)  ? norepinephrine (LEVOPHED) Adult infusion 17 mcg/min (06/03/21 0700)  ? vancomycin Stopped (06/03/21 0034)  ? ? ?PRN Medications: ?sodium chloride, sodium chloride, acetaminophen, albuterol, ALPRAZolam, alum & mag hydroxide-simeth, azelastine, gabapentin, hyoscyamine, loratadine, nitroGLYCERIN, ondansetron, simethicone, sodium chloride flush, sodium chloride flush, traMADol ? ? ? ?Patient Profile  ? ?[name] is a 36 year old with end stage HFrEF, on home milrinnone, NICM, HTN, asthma, OSA, obesity and DMII.  ?  ?Admitted with A/C End Stage HFrEF.  ? ?Assessment/Plan  ? ?1. A/C End Stage HFrEF , NICM -->Cardiogenic Shock ?- EF has been down for some time.   Echo 10/2020 EF < 20% and moderate RV dysfunction. He has been on home milrinone 0.375 since 9/22.  Not thought to be candidate for transplant d/t obesity and noncompliance. No VAD due to severe RV dysfunction (CVP 37 on cath) Echo in 1/23 reviewed, shows EF < 20%, mild LV dilation, severe RV dysfunction with moderate RV enlargement.  ?- On home milrinone 0.375 mcg.  ?- He is critically ill with severe biventricular HF. Has end-stage HF at baseline on home milrinone. He was tipped over by AFL. -> s/p DC-CV on  4/14 ?- Now with IABP, NE 17 milrinone 0.6. Hemodynamics remain very tenuous. CO-OX 44%, CI 1.5 this am.  ?- Remains massively fluid overloaded. On lasix gtt at 30/hr. Will give 5 mg metolazone.  ?- No BB with low output ?- Off digoxin d/t AKI an elevated level ?- Home spiro, bidil and entresto stopped d/t AKI and shock. Back on spiro now.  ?- No SGLT2i due to fungal infections.  ?- Family meeting yesterday with patient, his wife and father. Patient wants balloon pump out  and to go home tomorrow. Will plan to remove IABP this afternoon. Stop NE 04/20 and discharge on prior dose of milrinone.  ?- Hospice team following. Active with [facility]. ?  ?2. DMII ?- sugars high. Continue SSI  ?- A1c 8.8 ?  ?3. AKI on CKD Stage IIIb  ?- Creatinine baseline recently 1.1-1.3 with recent AKIs ?- Creatinine 1.5>1.8>2.44>2.36 -> 2.11 -> 1.98 -> 2.18 -> 2.0 > 1.8 > 1.6 > 1.5 > 1.6  in setting of cardiogenic shock ?- continue hemodynamic support and diuresis ?  ?4. PAF--->A Flutter RVR  ?- s/p DC-CV on 4/14. Remains in NSR  ?- On amio gtt. Continue for now. ?- Continue heparin gtt ?  ?5. OSA: Severe AHI 65 ?- Not wearing CPAP ?  ?6. Morbid obesity  ?- He was supposed to start [facility] but Medicaid denied it.  ?- He has continued on Trulicity.  ?- Body mass index is 48.12 kg/m?. ?  ?7. Acute Hypoxic Respiratory Failure ?-Decompensated with sats in 70s.  ?-O2 sats now stable on 4L [place] ? ?8. Hyponatremia ?- Na 125 ?- Tolvaptan if drops below 125 ?- Continue to diurese. ?- BMET this afternoon ?- Monitor. Restrict FW. ? ?9. Hypokalemia ?- K 3.9 ?- Supp aggressively with diuresis ? ?10. Leukocytosis ?- WBCs 10.1>11.7>13.6>13.1>12.6 ?- Low grade temp overnight, T max 99.31F ?- Treating empirically with course of vancomycin and cefepime ?- BC X 2 NGTD, procalcitonin 0.41>0.39>0.54 ? ?11. Constipation ?- Give reglan and sorbitol ? ?GOC: ?-Has home hospice through [facility].  ?-Plan to remove IABP today and discharge tomorrow with prior  home milrinone dose as above. ?-Will contact [facility] home infusion rep, Carolynn Sayers, RN ? ? ?Length of Stay: 8 ? ?FINCH, LINDSAY N, PA-C  ?06/03/2021, 7:45 AM ? ?Advanced Heart Failure Team ?Pager [phone] (M

## 2021-06-03 NOTE — Progress Notes (Signed)
ANTICOAGULATION CONSULT NOTE - Follow Up Consult ? ?Pharmacy Consult for heparin ?Indication:  IABP and Afib ?Heparin dosing wt: 171 kg ? ?Labs: ?Recent Labs  ?  05/31/21 ?1329 05/31/21 ?1555 06/01/21 ?0417 06/01/21 ?1430 06/02/21 ?0502 06/02/21 ?1400 06/03/21 ?0430  ?HGB  --    < > 11.5*  --  11.4*  --  11.3*  ?HCT  --   --  36.1*  --  35.5*  --  35.2*  ?PLT  --   --  186  --  169  --  158  ?APTT 78*  --  79*  --  78*  --   --   ?HEPARINUNFRC  --   --  0.68  --  0.56  --  0.55  ?CREATININE  --    < > 2.00*   < > 1.60* 1.53* 1.64*  ? < > = values in this interval not displayed.  ? ? ? ?Assessment: ?36yo male with cardiogenic shock and atrial fibrillation s/p IABP. Patient received apixaban 4/11-4/13 for atrial fibrillation. Pharmacy consulted for heparin.   ? ?Heparin level slightly above goal at 0.55. CBC stable. No s/sx of bleeding or issues with infusion per RN.  ? ?Goal of Therapy:  ?Heparin level goal 0.3-0.5 ?Monitor platelets per anticoagulation protocol: Yes  ?  ?Plan:  ?Decrease heparin infusion to 2,000 units/hr.   ?Daily HL and CBC. ?Monitor for s/sx of bleeding. ?F/u plan for removal of IABP.  ? ?Eliseo Gum, PharmD Candidate  ?06/03/2021 7:45 AM  ? ?Please check [email or web address] for unit-specific pharmacy phone numbers. ? ? ?

## 2021-06-03 NOTE — Progress Notes (Signed)
RT came to place patient on CPAP. Patient already on his home unit CPAP.  ?

## 2021-06-04 ENCOUNTER — Other Ambulatory Visit (HOSPITAL_COMMUNITY): Payer: Self-pay

## 2021-06-04 ENCOUNTER — Encounter (HOSPITAL_COMMUNITY): Payer: Medicaid Other

## 2021-06-04 DIAGNOSIS — R531 Weakness: Secondary | ICD-10-CM | POA: Diagnosis not present

## 2021-06-04 DIAGNOSIS — I130 Hypertensive heart and chronic kidney disease with heart failure and stage 1 through stage 4 chronic kidney disease, or unspecified chronic kidney disease: Secondary | ICD-10-CM | POA: Diagnosis not present

## 2021-06-04 DIAGNOSIS — M545 Low back pain, unspecified: Secondary | ICD-10-CM | POA: Diagnosis not present

## 2021-06-04 DIAGNOSIS — I25119 Atherosclerotic heart disease of native coronary artery with unspecified angina pectoris: Secondary | ICD-10-CM | POA: Diagnosis not present

## 2021-06-04 DIAGNOSIS — I5022 Chronic systolic (congestive) heart failure: Secondary | ICD-10-CM | POA: Diagnosis not present

## 2021-06-04 DIAGNOSIS — I4892 Unspecified atrial flutter: Secondary | ICD-10-CM | POA: Diagnosis not present

## 2021-06-04 DIAGNOSIS — J9691 Respiratory failure, unspecified with hypoxia: Secondary | ICD-10-CM | POA: Diagnosis not present

## 2021-06-04 DIAGNOSIS — J302 Other seasonal allergic rhinitis: Secondary | ICD-10-CM | POA: Diagnosis not present

## 2021-06-04 DIAGNOSIS — Z743 Need for continuous supervision: Secondary | ICD-10-CM | POA: Diagnosis not present

## 2021-06-04 DIAGNOSIS — F32A Depression, unspecified: Secondary | ICD-10-CM | POA: Diagnosis not present

## 2021-06-04 DIAGNOSIS — L304 Erythema intertrigo: Secondary | ICD-10-CM | POA: Diagnosis not present

## 2021-06-04 DIAGNOSIS — Z794 Long term (current) use of insulin: Secondary | ICD-10-CM | POA: Diagnosis not present

## 2021-06-04 DIAGNOSIS — E1142 Type 2 diabetes mellitus with diabetic polyneuropathy: Secondary | ICD-10-CM | POA: Diagnosis not present

## 2021-06-04 DIAGNOSIS — T80212D Local infection due to central venous catheter, subsequent encounter: Secondary | ICD-10-CM | POA: Diagnosis not present

## 2021-06-04 DIAGNOSIS — G4733 Obstructive sleep apnea (adult) (pediatric): Secondary | ICD-10-CM | POA: Diagnosis not present

## 2021-06-04 DIAGNOSIS — M14672 Charcot's joint, left ankle and foot: Secondary | ICD-10-CM | POA: Diagnosis not present

## 2021-06-04 DIAGNOSIS — E669 Obesity, unspecified: Secondary | ICD-10-CM | POA: Diagnosis not present

## 2021-06-04 DIAGNOSIS — R570 Cardiogenic shock: Secondary | ICD-10-CM | POA: Diagnosis not present

## 2021-06-04 DIAGNOSIS — J45909 Unspecified asthma, uncomplicated: Secondary | ICD-10-CM | POA: Diagnosis not present

## 2021-06-04 DIAGNOSIS — Z9981 Dependence on supplemental oxygen: Secondary | ICD-10-CM | POA: Diagnosis not present

## 2021-06-04 DIAGNOSIS — F419 Anxiety disorder, unspecified: Secondary | ICD-10-CM | POA: Diagnosis not present

## 2021-06-04 LAB — BASIC METABOLIC PANEL
Anion gap: 13 (ref 5–15)
Anion gap: 12 (ref 5–15)
BUN: 51 mg/dL — ABNORMAL HIGH (ref 6–20)
BUN: 54 mg/dL — ABNORMAL HIGH (ref 6–20)
CO2: 24 mmol/L (ref 22–32)
CO2: 25 mmol/L (ref 22–32)
Calcium: 8.6 mg/dL — ABNORMAL LOW (ref 8.9–10.3)
Calcium: 8.7 mg/dL — ABNORMAL LOW (ref 8.9–10.3)
Chloride: 83 mmol/L — ABNORMAL LOW (ref 98–111)
Chloride: 83 mmol/L — ABNORMAL LOW (ref 98–111)
Creatinine, Ser: 1.71 mg/dL — ABNORMAL HIGH (ref 0.61–1.24)
Creatinine, Ser: 1.84 mg/dL — ABNORMAL HIGH (ref 0.61–1.24)
GFR, Estimated: 53 mL/min — ABNORMAL LOW (ref >60)
GFR, Estimated: 48 mL/min — ABNORMAL LOW (ref >60)
Glucose, Bld: 199 mg/dL — ABNORMAL HIGH (ref 70–99)
Glucose, Bld: 341 mg/dL — ABNORMAL HIGH (ref 70–99)
Potassium: 3.6 mmol/L (ref 3.5–5.1)
Potassium: 3.7 mmol/L (ref 3.5–5.1)
Sodium: 120 mmol/L — ABNORMAL LOW (ref 135–145)
Sodium: 120 mmol/L — ABNORMAL LOW (ref 135–145)

## 2021-06-04 LAB — CBC
HCT: 34.4 % — ABNORMAL LOW (ref 39.0–52.0)
Hemoglobin: 11.4 g/dL — ABNORMAL LOW (ref 13.0–17.0)
MCH: 29.5 pg (ref 26.0–34.0)
MCHC: 33.1 g/dL (ref 30.0–36.0)
MCV: 89.1 fL (ref 80.0–100.0)
Platelets: 170 10*3/uL (ref 150–400)
RBC: 3.86 MIL/uL — ABNORMAL LOW (ref 4.22–5.81)
RDW: 16.1 % — ABNORMAL HIGH (ref 11.5–15.5)
WBC: 10.6 10*3/uL — ABNORMAL HIGH (ref 4.0–10.5)
nRBC: 0 % (ref 0.0–0.2)

## 2021-06-04 LAB — GLUCOSE, CAPILLARY
Glucose-Capillary: 202 mg/dL — ABNORMAL HIGH (ref 70–99)
Glucose-Capillary: 200 mg/dL — ABNORMAL HIGH (ref 70–99)
Glucose-Capillary: 169 mg/dL — ABNORMAL HIGH (ref 70–99)

## 2021-06-04 LAB — COOXEMETRY PANEL
Carboxyhemoglobin: 0.9 % (ref 0.5–1.5)
Methemoglobin: <0.7 % (ref 0.0–1.5)
O2 Saturation: 34.5 %
Total hemoglobin: 11.7 g/dL — ABNORMAL LOW (ref 12.0–16.0)

## 2021-06-04 MED ORDER — APIXABAN 5 MG PO TABS
5.0000 mg | ORAL_TABLET | Freq: Two times a day (BID) | ORAL | Status: DC
  Administered 2021-06-04: 5 mg via ORAL
  Filled 2021-06-04: qty 1

## 2021-06-04 MED ORDER — TORSEMIDE 100 MG PO TABS
100.0000 mg | ORAL_TABLET | Freq: Two times a day (BID) | ORAL | 5 refills | Status: AC
Start: 2021-06-04 — End: ?
  Filled 2021-06-04: qty 62, 31d supply, fill #0

## 2021-06-04 MED ORDER — POTASSIUM CHLORIDE CRYS ER 20 MEQ PO TBCR
40.0000 meq | EXTENDED_RELEASE_TABLET | Freq: Once | ORAL | Status: AC
  Administered 2021-06-04: 40 meq via ORAL
  Filled 2021-06-04: qty 2

## 2021-06-04 MED ORDER — AMIODARONE HCL 200 MG PO TABS
200.0000 mg | ORAL_TABLET | Freq: Every day | ORAL | 5 refills | Status: AC
  Filled 2021-06-04: qty 31, 31d supply, fill #0

## 2021-06-04 MED ORDER — TOLVAPTAN 15 MG PO TABS
15.0000 mg | ORAL_TABLET | Freq: Once | ORAL | Status: AC
  Administered 2021-06-04: 15 mg via ORAL
  Filled 2021-06-04: qty 1

## 2021-06-04 MED ORDER — AMIODARONE HCL 200 MG PO TABS
100.0000 mg | ORAL_TABLET | Freq: Every day | ORAL | Status: DC
  Administered 2021-06-04: 100 mg via ORAL
  Filled 2021-06-04: qty 1

## 2021-06-04 MED ORDER — SPIRONOLACTONE 25 MG PO TABS
12.5000 mg | ORAL_TABLET | Freq: Every day | ORAL | 5 refills | Status: AC
  Filled 2021-06-04: qty 15, 30d supply, fill #0

## 2021-06-04 MED ORDER — AMIODARONE HCL 200 MG PO TABS: 200.0000 mg | ORAL_TABLET | Freq: Every day | ORAL | Status: DC

## 2021-06-04 NOTE — Progress Notes (Signed)
Patient to discharge home. Currently has home milrinone set up and ready to go. TOC pharmacy dropped off appropriate medications. AVS reviewed with patient and family. Patient has no other questions at this time. [facility] to transport patient to home. ?

## 2021-06-04 NOTE — Discharge Summary (Addendum)
Advanced Heart Failure Team ? ?Discharge Summary  ? ?Patient ID: Jonathan [name] ?MRN: [id number], DOB/AGE: Oct 18, 1985 36 y.o. Admit date: 05/26/2021 ?D/C date:     06/04/2021  ? ?Primary Discharge Diagnoses:  ?End stage HF ?Cardiogenic shock ?Nonischemic cardiomyopathy ?DM II ?AKI on CKD IIIB ?PAF/AFL with RVR ?OSA ?Morbid obesity ?Acute hypoxic respiratory failure ?Hyponatremia ? ?Hospital Course:  ? ?Jonathan [name] is a 36 y.o.with a history of chronic HFrEF, NICM, HTN, asthma, OSA, morbid obesity, and uncontrolled DM. He is on home milrinone 0.375 mcg.  ?  ?Diagnosed with systolic HF in [place] [facility]. EF 15-20% ?  ?Repeat Echo 02/2017. EF had normalized to 55% but went back down when he was off HF meds. EF in 2021 back down to 30-35%. He has refused ICD.  ?  ?He has been followed in the HF clinic and was last seen 05/2020.  He cancelled follow up appointments 08/2020 and 09/2020.  ?  ?Admitted 10/28/20 with A/C HFrEF, A fib RVR , and marked volume overload. Hospital course complicated by low output so milrinone started. Diuresed with lasix drip + metolazone+ diamox.   Failed milrinone wean and was discharged on milrinone. He was not a candidate for advanced therapies due to noncompliance and uncontrolled diabetes. HGB A1C >14, Palliative consulted for goals of care. He elected DNR/DNI. Referred to [facility] for Hospice services.  ?  ?Seen in ED 11/25/20 with home milrinone pump issues.  ?  ?Admitted 04/05/21 with sepsis and A/C HFrEF.  Had been on home milrinone through PICC. Blood CX Enterococcus faecalis and Staph epidermidis bacteremia. Placed on ceftriaxone + ampicllin. PICC removed. Diuresed with IV lasix.HF meds adjusted. PICC line replaced once cultures clear. Discharged on 2 weeks of linezolid. Discharged on milrinone 0.375 mcg. Plan to consider transplant if he can lose weight. Discharge weight 337 pounds. ?  ?He was seen in the HF clinic 04/23/21 and was given 80 mg IV lasix. Volume overloaded in the setting of high sodium  diet.  Returned the next week, volume mildly up and metolazone 2.5 + extra 40 KCL added weekly. ?  ?Saw PCP on 05/20/21 for abdominal pain. CDiff & GI PCR negative. Weight at that time was 25 pounds up.  ?  ?On 04/12 he was readmitted to [facility] with a/c CHF and cardiogenic shock exacerbated by AFL with RVR. Milrinone increased to 0.5. He was initiated on NE for additional support and IABP was placed. He was started on amiodarone gtt and underwent successful DCCV 04/14. He was diuresed with IV lasix. CI remained low despite IABP and multiple pressors. Met with family to discuss options for treatment - patient wanted IABP out and to discharge home. IABP pulled on 04/19. NE weaned off and discharged home with hospice on milrinone 0.5 (previously 0.375).  ? ?Code status discussed during admission. Wishes to remain full code. ? ?F/u in HF clinic arranged.  ? ?Please see below for hospital course by problem. ? ? ?Hospital Course by Problem  ?1. A/C End Stage HFrEF , NICM -->Cardiogenic Shock ?- EF has been down for some time.   Echo 10/2020 EF < 20% and moderate RV dysfunction. He has been on home milrinone 0.375 since 9/22.  Not thought to be candidate for transplant d/t obesity and noncompliance. No VAD due to severe RV dysfunction (CVP 37 on cath) Echo in 1/23 reviewed, shows EF < 20%, mild LV dilation, severe RV dysfunction with moderate RV enlargement.  ?- He is critically ill with severe biventricular HF. Has  end-stage HF at baseline on home milrinone. He was tipped over by AFL. -> s/p DC-CV on 4/14 ?- Family meeting 04/18 with patient, his wife and father. He wanted balloon pump removed and to go home. IABP pulled 04/19. NE weaned off this am. ?- Discharge home on milrinone 0.5 (previously on 0.375). ?- CVP remains significantly elevated, > 20. Stop lasix gtt. Transition to po Torsemide 100 mg BID (on 80 BID previously) + once weekly metolazone (takes on Thursday) ?- No BB with low output ?- Off digoxin d/t AKI an  elevated level ?- Home spiro, bidil and entresto stopped d/t AKI and shock. Restart spiro 12.5 mg daily at discharge ?- No SGLT2i due to fungal infections.  ?- Hospice team following. Active with [facility] ?  ?2. DMII ?- sugars high. Continue SSI  ?- A1c 8.8 ?  ?3. AKI on CKD Stage IIIb  ?- Creatinine baseline recently 1.1-1.3 with recent AKIs ?- Creatinine 1.5>1.8>2.44>2.36 -> 2.11 -> 1.98 -> 2.18 -> 2.0 > 1.8 > 1.6 > 1.5 > 1.6 >1.71 in setting of cardiogenic shock ?- continue hemodynamic support and diuresis ?  ?4. PAF--->A Flutter RVR  ?- s/p DC-CV on 4/14. Remains in NSR/sinus tach ?- Transition IV amio back to PO amio 200 mg daily. ?- Continue Eliquis 5 mg BID ?  ?5. OSA: Severe AHI 65 ?- Not wearing CPAP ?  ?6. Morbid obesity  ?- He was supposed to start [facility] but Medicaid denied it.  ?- He has continued on Trulicity.  ?- Body mass index is 48.97 kg/m?. ?  ?7. Acute Hypoxic Respiratory Failure ?-Decompensated with sats in 70s.  ?-O2 sats now stable on 4L [place], has home O2 ?  ?8. Hyponatremia ?- Na 120 ?- Received 15 mg Tolvaptan this am. Na 125 this afternoon with corrected for hyperglycemia ?  ?9. Hypokalemia ?- K 37. Supp'd today ?- PO supplement ordered at discharge ?  ?10. Leukocytosis ?- WBCs 10.1>11.7>13.6>13.1>12.6>10.6 ?-No fever ?- Treated empirically with course of vancomycin and cefepime ?- BC X 2 NGTD ? ?GOC: ?-Has home hospice through [facility].  ?-Home today with hospice. Transport home via [employer]. ?-Contacted [facility] home infusion rep, Carolynn Sayers, RN. Supplies will arrive this afternoon. ? ? ?Discharge Vitals: Blood pressure 112/68, pulse (!) 105, temperature 98.1 ?F (36.7 ?C), resp. rate (!) 23, height _0  (1.88 m), weight (!) 173 kg, SpO2 98 %. ? ?Labs: ?Lab Results  ?Component Value Date  ? WBC 10.6 (H) 06/04/2021  ? HGB 11.4 (L) 06/04/2021  ? HCT 34.4 (L) 06/04/2021  ? MCV 89.1 06/04/2021  ? PLT 170 06/04/2021  ?  ?Recent Labs  ?Lab 05/29/21 ?1352 05/30/21 ?0510 06/04/21 ?1504   ?NA 125*   < > 120*  ?K 4.5   < > 3.7  ?CL 87*   < > 83*  ?CO2 26   < > 25  ?BUN 59*   < > 54*  ?CREATININE 2.22*   < > 1.84*  ?CALCIUM 8.7*   < > 8.7*  ?PROT 7.5  --   --   ?BILITOT 1.3*  --   --   ?ALKPHOS 86  --   --   ?ALT 23  --   --   ?AST 28  --   --   ?GLUCOSE 213*   < > 341*  ? < > = values in this interval not displayed.  ? ?Lab Results  ?Component Value Date  ? CHOL 131 04/11/2021  ? HDL 23 (L) 04/11/2021  ?  [place] 97 04/11/2021  ? TRIG 53 04/11/2021  ? ?BNP (last 3 results) ?Recent Labs  ?  03/12/21 ?1505 04/05/21 ?0148 05/26/21 ?1813  ?BNP 448.3* 460.5* 342.4*  ? ? ?ProBNP (last 3 results) ?No results for input(s): PROBNP in the last 8760 hours. ? ? ?Diagnostic Studies/Procedures  ? ?RHC/IABP insertion, 05/28/21 ?Findings: ?  ?On NE 5 and milrinone 0.5 ?  ?RA = 37 ?RV = 47/38 ?PA = 51/47 (49) ?PCW = 32 ?Fick cardiac output/index = 3.7/1.3 ?Themo CO/CI = 4.1/1.4 ?PVR = 3.5 WU ?FA sat = 99% ?PA sat = 40%. 43% ?  ?Assessment: ?1. Severe biventricular HF with cardiogenic shock  ?  ?Plan/Discussion: ?Continue hemodynamic support. Plan DC/CV of Afl when more stable.  ? ?Echo, 05/27/21 ?IMPRESSIONS  ? 1. Left ventricular ejection fraction, by estimation, is <20%. The left ventricle has severely decreased function. The left ventricle demonstrates global hypokinesis. The left ventricular internal cavity size was mildly  ?dilated. Left ventricular diastolic function could not be evaluated. Elevated left ventricular  ?end-diastolic pressure.  ? 2. Right ventricular systolic function is severely reduced. The right ventricular size is moderately enlarged. There is mildly elevated pulmonary artery systolic pressure. The estimated right ventricular systolic pressure is 64.2 mmHg.  ? 3. The mitral valve is normal in structure. Mild mitral valve regurgitation. No evidence of mitral stenosis.  ? 4. Tricuspid valve regurgitation is mild to moderate.  ? 5. The aortic valve is normal in structure. Aortic valve  regurgitation is not visualized. No aortic stenosis is present.  ? 6. The inferior vena cava is normal in size with greater than 50% respiratory variability, suggesting right atrial pressure of 3 mmHg.  ? ?Discharge Medicatio

## 2021-06-04 NOTE — TOC Initial Note (Signed)
Transition of Care (TOC) - Initial/Assessment Note  ? ? ?Patient Details  ?Name: Jonathan [name] ?MRN: [id number] ?Date of Birth: 22-Sep-1985 ? ?Transition of Care [facility]) CM/SW Contact:    ?Mariea Stable Davene Costain, RN ?Phone Number: [phone] ?06/04/2021, 4:02 PM ? ?Clinical Narrative:                 ?HF TOC CM spoke to pt and wife at bedside. Explained transportation will be by [facility] once he is connected to Home Milrinone. The Unit RN will call transport. Hospital bed has been delivered. Provided Unit RN with [facility] necessary paperwork.  ? ?Expected Discharge Plan: Home w Hospice Care ?Barriers to Discharge: No Barriers Identified ? ? ?Patient Goals and CMS Choice ?  ?CMS Medicare.gov Compare Post Acute Care list provided to:: Patient ?Choice offered to / list presented to : Patient, Spouse ? ?Expected Discharge Plan and Services ?Expected Discharge Plan: Home w Hospice Care ?  ?Discharge Planning Services: CM Consult ?Post Acute Care Choice: Hospice ?Living arrangements for the past 2 months: Apartment ?Expected Discharge Date: 06/04/21               ?  ?DME Agency: [employer] ?Date DME Agency Contacted: 06/04/21 ?Time DME Agency Contacted: 1600 ?  ?HH Arranged: RN ?HH Agency: [facility] ?Date HH Agency Contacted: 06/04/21 ?Time HH Agency Contacted: 1601 ?Representative spoke with at [facility] Agency: Jeri Modena RN, Eben Burow RN ? ?Prior Living Arrangements/Services ?Living arrangements for the past 2 months: Apartment ?Lives with:: Spouse ?Patient language and need for interpreter reviewed:: Yes ?Do you feel safe going back to the place where you live?: Yes      ?Need for Family Participation in Patient Care: Yes (Comment) ?Care giver support system in place?: Yes (comment) ?  ?Criminal Activity/Legal Involvement Pertinent to Current Situation/Hospitalization: No - Comment as needed ? ?Activities of Daily Living ?Home Assistive Devices/Equipment: None ?ADL Screening  (condition at time of admission) ?Patient's cognitive ability adequate to safely complete daily activities?: Yes ?Is the patient deaf or have difficulty hearing?: No ?Does the patient have difficulty seeing, even when wearing glasses/contacts?: No ?Does the patient have difficulty concentrating, remembering, or making decisions?: No ?Patient able to express need for assistance with ADLs?: Yes ?Does the patient have difficulty dressing or bathing?: Yes ?Independently performs ADLs?: Yes (appropriate for developmental age) ?Does the patient have difficulty walking or climbing stairs?: Yes ?Weakness of Legs: None ?Weakness of Arms/Hands: None ? ?Permission Sought/Granted ?Permission sought to share information with : Case Manager, Family Supports, PCP ?Permission granted to share information with : Yes, Verbal Permission Granted ? Share Information with NAME: Joakim Huesman ? Permission granted to share info w AGENCY: Home Hospice ? Permission granted to share info w Relationship: wife ? Permission granted to share info w Contact Information: [phone] ? ?Emotional Assessment ?Appearance:: Appears stated age ?Attitude/Demeanor/Rapport: Lethargic ?Affect (typically observed): Accepting ?Orientation: : Oriented to Place, Oriented to Self, Oriented to  Time, Oriented to Situation ?  ?Psych Involvement: No (comment) ? ?Admission diagnosis:  SOB (shortness of breath) [R06.02] ?Acute on chronic systolic (congestive) heart failure (HCC) [I50.23] ?Acute HFrEF (heart failure with reduced ejection fraction) (HCC) [I50.21] ?Patient Active Problem List  ? Diagnosis Date Noted  ? Pressure injury of skin 05/28/2021  ? Acute on chronic systolic (congestive) heart failure (HCC) 05/27/2021  ? Acute HFrEF (heart failure with reduced ejection fraction) (HCC) 05/26/2021  ? Diarrhea 05/20/2021  ? Rash 04/23/2021  ? Positive  blood culture   ? Hyponatremia with excess extracellular fluid volume 04/05/2021  ? Acute renal failure (HCC)    ? Hyperkalemia   ? Neck pain on left side 02/18/2021  ? Atrial flutter (HCC) 12/10/2020  ? Goals of care, counseling/discussion   ? Intertrigo 10/17/2020  ? Mold exposure 06/17/2020  ? Depression 11/06/2017  ? Charcot ankle, left 05/31/2017  ? Diabetic polyneuropathy associated with type 2 diabetes mellitus (HCC) 03/14/2017  ? OSA (obstructive sleep apnea) 12/02/2016  ? Chronic systolic (congestive) heart failure (HCC)   ? Morbid obesity with BMI of 50.0-59.9, adult (HCC) 10/26/2016  ? Asthma, chronic, mild persistent, uncomplicated 10/26/2016  ? Essential hypertension 10/26/2016  ? Diabetes mellitus type 2 in obese (HCC) 04/20/2016  ? ?PCP:  Dolan Amen, MD ?Pharmacy:   ?[id number] - [place], [place] - [address] ?[address] ?[place] [address] ?Phone: [phone] Fax: [phone] ? ? ? ? ?Social Determinants of Health (SDOH) Interventions ?  ? ?Readmission Risk Interventions ?   ? View : No data to display.  ?  ?  ?  ? ? ? ?

## 2021-06-04 NOTE — TOC CM/SW Note (Signed)
HF TOC CM spoke to [employer] Coordinator, Pam RN and plan to connect pt to Home Milrinone at 2-3 pm. Will call [facility] to transport home. Pt is active with Scientist, clinical (histocompatibility and immunogenetics). And Home Hospice aware of dc home today. Isidoro Donning RN3 CCM, Heart Failure TOC CM [phone]  ?

## 2021-06-04 NOTE — Progress Notes (Addendum)
?  ? ? Advanced Heart Failure Rounding Note ? ?PCP-Cardiologist: Fransico Him, MD  ? ?Subjective:   ? ?04/11: Admitted with volume overload/CGS precipitated by AFL with RVR. On home milrinone 0.375 mcg. Started on lasix drip.  ?04/13: Milrinone increased to 0.5, NE added. IABP and SWAN placed d/t ongoing shock. ?04/14: AFL >> S/p DC-CV to SR ?04/19: IABP out ? ?CO-OX 35% on Milrinone 0.6 + NE 17 ? ?CVP > 20 ? ?5.7L UOP yesterday with lasix gtt at 30/hr + 5 mg metolazone.  ? ?Scr 1.71, Na 120, K 3.6 ? ?Feels okay. No complaints. Ready to go home.  ? ? ? ?SWAN #s ?PAP: (49-77)/(26-51) 68/41 ?CVP:  [23 mmHg-75 mmHg] 27 mmHg ?CO:  [3.4 L/min] 3.4 L/min ?CI:  [1.2 L/min/m2] 1.2 L/min/m2 ? ? ?Objective:   ?Weight Range: ?(!) 173 kg ?Body mass index is 48.97 kg/m?.  ? ?Vital Signs:   ?Temp:  [98.2 ?F (36.8 ?C)-99.5 ?F (37.5 ?C)] 98.2 ?F (36.8 ?C) (04/20 0800) ?Pulse Rate:  [86-215] 115 (04/20 0800) ?Resp:  [17-29] 28 (04/20 0800) ?BP: (105-163)/(74-137) 136/99 (04/20 0700) ?SpO2:  [86 %-97 %] 96 % (04/20 0800) ?Arterial Line BP: (104-123)/(81-96) 105/81 (04/19 1445) ?Weight:  [173 kg] 173 kg (04/20 0500) ?Last BM Date : 06/04/21 ? ?Weight change: ?Filed Weights  ? 06/03/21 0500 06/03/21 0700 06/04/21 0500  ?Weight: (!) 170 kg (!) 170 kg (!) 173 kg  ? ? ?Intake/Output:  ? ?Intake/Output Summary (Last 24 hours) at 06/04/2021 0916 ?Last data filed at 06/04/2021 0800 ?Gross per 24 hour  ?Intake 4387.01 ml  ?Output 5178 ml  ?Net -790.99 ml  ?  ? ? ?Physical Exam  ?CVP >20 ?General:  No distress. Sitting up on side of bed. ?HEENT: normal ?Neck: supple. JVP to ear. R IJ swan. Carotids 2+ bilat; no bruits.  ?Cor: PMI nondisplaced. Regular rate & rhythm, tachy. No rubs, gallops or murmurs. ?Lungs: clear ?Abdomen: obese, soft, nontender, nondistended.  ?Extremities: no cyanosis, clubbing, rash, 1+ edema ?Neuro: alert & orientedx3, cranial nerves grossly intact. moves all 4 extremities w/o difficulty. Affect pleasant ? ? ?Telemetry   ? ?Sinus tach 110s (personally reviewed) ? ?Labs  ?  ?CBC ?Recent Labs  ?  06/03/21 ?1601 06/04/21 ?0405  ?WBC 10.6* 10.6*  ?HGB 10.9* 11.4*  ?HCT 33.6* 34.4*  ?MCV 90.1 89.1  ?PLT 162 170  ? ?Basic Metabolic Panel ?Recent Labs  ?  06/03/21 ?1601 06/04/21 ?0405  ?NA 125* 120*  ?K 4.0 3.6  ?CL 86* 83*  ?CO2 26 24  ?GLUCOSE 203* 199*  ?BUN 50* 51*  ?CREATININE 1.65* 1.71*  ?CALCIUM 8.9 8.6*  ? ?Liver Function Tests ?No results for input(s): AST, ALT, ALKPHOS, BILITOT, PROT, ALBUMIN in the last 72 hours. ? ?No results for input(s): LIPASE, AMYLASE in the last 72 hours. ? ?Cardiac Enzymes ?No results for input(s): CKTOTAL, CKMB, CKMBINDEX, TROPONINI in the last 72 hours. ? ?BNP: ?BNP (last 3 results) ?Recent Labs  ?  03/12/21 ?1505 04/05/21 ?0148 05/26/21 ?1813  ?BNP 448.3* 460.5* 342.4*  ? ? ?ProBNP (last 3 results) ?No results for input(s): PROBNP in the last 8760 hours. ? ? ?D-Dimer ?No results for input(s): DDIMER in the last 72 hours. ?Hemoglobin A1C ?No results for input(s): HGBA1C in the last 72 hours. ? ?Fasting Lipid Panel ?No results for input(s): CHOL, HDL, LDLCALC, TRIG, CHOLHDL, LDLDIRECT in the last 72 hours. ?Thyroid Function Tests ?No results for input(s): TSH, T4TOTAL, T3FREE, THYROIDAB in the last 72 hours. ? ?Invalid  input(s): FREET3 ? ?Other results: ? ? ?Imaging  ? ? ?No results found. ? ? ?Medications:   ? ? ?Scheduled Medications: ? amiodarone  100 mg Oral Daily  ? apixaban  5 mg Oral BID  ? Chlorhexidine Gluconate Cloth  6 each Topical Daily  ? Dulaglutide  1.5 mg Subcutaneous Weekly  ? insulin aspart  0-20 Units Subcutaneous TID WC  ? insulin aspart  0-5 Units Subcutaneous QHS  ? insulin aspart  2 Units Subcutaneous TID WC  ? insulin detemir  28 Units Subcutaneous BID  ? pantoprazole  40 mg Oral Daily  ? polyethylene glycol  17 g Oral BID  ? senna-docusate  2 tablet Oral BID  ? sodium chloride flush  10-40 mL Intracatheter Q12H  ? sodium chloride flush  3 mL Intravenous Q12H  ? ? ?Infusions: ?  sodium chloride    ? sodium chloride    ? sodium chloride 10 mL/hr at 06/01/21 0831  ? sodium chloride    ? milrinone 0.5 mcg/kg/min (06/04/21 0853)  ? norepinephrine (LEVOPHED) Adult infusion 10 mcg/min (06/04/21 0853)  ? ? ?PRN Medications: ?sodium chloride, sodium chloride, acetaminophen, albuterol, ALPRAZolam, alum & mag hydroxide-simeth, azelastine, gabapentin, hyoscyamine, loratadine, nitroGLYCERIN, ondansetron, simethicone, sodium chloride flush, sodium chloride flush, traMADol ? ? ? ?Patient Profile  ? ?[name] is a 36 year old with end stage HFrEF, on home milrinnone, NICM, HTN, asthma, OSA, obesity and DMII.  ?  ?Admitted with A/C End Stage HFrEF.  ? ?Assessment/Plan  ? ?1. A/C End Stage HFrEF , NICM -->Cardiogenic Shock ?- EF has been down for some time.   Echo 10/2020 EF < 20% and moderate RV dysfunction. He has been on home milrinone 0.375 since 9/22.  Not thought to be candidate for transplant d/t obesity and noncompliance. No VAD due to severe RV dysfunction (CVP 37 on cath) Echo in 1/23 reviewed, shows EF < 20%, mild LV dilation, severe RV dysfunction with moderate RV enlargement.  ?- On home milrinone 0.375 mcg.  ?- He is critically ill with severe biventricular HF. Has end-stage HF at baseline on home milrinone. He was tipped over by AFL. -> s/p DC-CV on 4/14 ?- Family meeting 04/18 with patient, his wife and father. He wanted balloon pump removed and to go home. IABP pulled 04/19. Decrease NE to 10 then stop at 11 am.  ?- Discharge home on milrinone 0.5 (previously on 0.375). ?- CVP remains significantly elevated, > 20. Stop lasix gtt. Transition to po Torsemide 100 mg BID (on 80 BID previously) + once weekly metolazone (takes on Thursday) ?- No BB with low output ?- Off digoxin d/t AKI an elevated level ?- Home spiro, bidil and entresto stopped d/t AKI and shock.  ?- No SGLT2i due to fungal infections.  ?- Hospice team following. Active with [facility]. ?  ?2. DMII ?- sugars high. Continue SSI   ?- A1c 8.8 ?  ?3. AKI on CKD Stage IIIb  ?- Creatinine baseline recently 1.1-1.3 with recent AKIs ?- Creatinine 1.5>1.8>2.44>2.36 -> 2.11 -> 1.98 -> 2.18 -> 2.0 > 1.8 > 1.6 > 1.5 > 1.6 >1.71 in setting of cardiogenic shock ?- continue hemodynamic support and diuresis ?  ?4. PAF--->A Flutter RVR  ?- s/p DC-CV on 4/14. Remains in NSR/sinus tach ?- Transition IV amio back to PO ?- Stop heparin gtt. Restart Eliquis 5 BID. ?  ?5. OSA: Severe AHI 65 ?- Not wearing CPAP ?  ?6. Morbid obesity  ?- He was supposed to start  Mounjaro but Medicaid denied it.  ?- He has continued on Trulicity.  ?- Body mass index is 48.97 kg/m?. ?  ?7. Acute Hypoxic Respiratory Failure ?-Decompensated with sats in 70s.  ?-O2 sats now stable on 4L [place] ? ?8. Hyponatremia ?- Na 120 ?- Give 1 dose of Tolvaptan today ?- Continue to diurese. ?- BMET this afternoon ?- Monitor. Restrict FW. ? ?9. Hypokalemia ?- K 3.6 ?- Supp aggressively with diuresis ? ?10. Leukocytosis ?- WBCs 10.1>11.7>13.6>13.1>12.6>10.6 ?-No fever ?- Treated empirically with course of vancomycin and cefepime ?- BC X 2 NGT ? ? ?GOC: ?-Has home hospice through [employer].  ?-Home today with hospice. Transport home via [employer]. ?-Contacted [facility] home infusion rep, Carolynn Sayers, RN. Supplies will arrive this afternoon. ? ? ?Plan discharge home this afternoon once inotrope supplies are delivered and ride arranged with [facility]. ? ?Removing foley. Will need to void on his own before discharge. ? ? ?Length of Stay: 9 ? ?FINCH, LINDSAY N, PA-C  ?06/04/2021, 9:16 AM ? ?Advanced Heart Failure Team ?Pager [phone] (M-F; 7a - 5p)  ?Please contact [facility] for night-coverage after hours (5p -7a ) and weekends on [email or web address] ? ?Agree with above.  ? ?IABP pulled yesterday. Remains on NE and milrinone. Hemodynamics consistent with ongoing shock. He wants to go home with Hospice and palliative milrinone ? ?General:  Lying in bed No resp difficulty ?HEENT: normal ?Neck: supple. RIJ swan Carotids  2+ bilat; no bruits. No lymphadenopathy or thryomegaly appreciated. ?Cor: PMI nondisplaced. Regular rate & rhythm. +s3 2/6 TR ?Lungs: clear ?Abdomen: obese soft, nontender, nondistended. No hepatospleno

## 2021-06-05 ENCOUNTER — Telehealth: Payer: Self-pay

## 2021-06-05 DIAGNOSIS — Z794 Long term (current) use of insulin: Secondary | ICD-10-CM | POA: Diagnosis not present

## 2021-06-05 DIAGNOSIS — T80212D Local infection due to central venous catheter, subsequent encounter: Secondary | ICD-10-CM | POA: Diagnosis not present

## 2021-06-05 DIAGNOSIS — E669 Obesity, unspecified: Secondary | ICD-10-CM | POA: Diagnosis not present

## 2021-06-05 DIAGNOSIS — I130 Hypertensive heart and chronic kidney disease with heart failure and stage 1 through stage 4 chronic kidney disease, or unspecified chronic kidney disease: Secondary | ICD-10-CM | POA: Diagnosis not present

## 2021-06-05 DIAGNOSIS — I25119 Atherosclerotic heart disease of native coronary artery with unspecified angina pectoris: Secondary | ICD-10-CM | POA: Diagnosis not present

## 2021-06-05 DIAGNOSIS — F32A Depression, unspecified: Secondary | ICD-10-CM | POA: Diagnosis not present

## 2021-06-05 DIAGNOSIS — J45909 Unspecified asthma, uncomplicated: Secondary | ICD-10-CM | POA: Diagnosis not present

## 2021-06-05 DIAGNOSIS — Z9981 Dependence on supplemental oxygen: Secondary | ICD-10-CM | POA: Diagnosis not present

## 2021-06-05 DIAGNOSIS — E1142 Type 2 diabetes mellitus with diabetic polyneuropathy: Secondary | ICD-10-CM | POA: Diagnosis not present

## 2021-06-05 DIAGNOSIS — M545 Low back pain, unspecified: Secondary | ICD-10-CM | POA: Diagnosis not present

## 2021-06-05 DIAGNOSIS — E875 Hyperkalemia: Secondary | ICD-10-CM

## 2021-06-05 DIAGNOSIS — M14672 Charcot's joint, left ankle and foot: Secondary | ICD-10-CM | POA: Diagnosis not present

## 2021-06-05 DIAGNOSIS — J302 Other seasonal allergic rhinitis: Secondary | ICD-10-CM | POA: Diagnosis not present

## 2021-06-05 DIAGNOSIS — I4892 Unspecified atrial flutter: Secondary | ICD-10-CM | POA: Diagnosis not present

## 2021-06-05 DIAGNOSIS — I5022 Chronic systolic (congestive) heart failure: Secondary | ICD-10-CM | POA: Diagnosis not present

## 2021-06-05 DIAGNOSIS — G4733 Obstructive sleep apnea (adult) (pediatric): Secondary | ICD-10-CM | POA: Diagnosis not present

## 2021-06-05 DIAGNOSIS — J9691 Respiratory failure, unspecified with hypoxia: Secondary | ICD-10-CM | POA: Diagnosis not present

## 2021-06-05 DIAGNOSIS — L304 Erythema intertrigo: Secondary | ICD-10-CM | POA: Diagnosis not present

## 2021-06-05 DIAGNOSIS — F419 Anxiety disorder, unspecified: Secondary | ICD-10-CM | POA: Diagnosis not present

## 2021-06-05 NOTE — Telephone Encounter (Signed)
Transition Care Management Unsuccessful Follow-up Telephone Call ? ?Date of discharge and from where:  06/04/2021 from [facility] ? ?Attempts:  1st Attempt ? ?Reason for unsuccessful TCM follow-up call:  Left voice message ? ? ? ?

## 2021-06-06 DIAGNOSIS — T80212D Local infection due to central venous catheter, subsequent encounter: Secondary | ICD-10-CM | POA: Diagnosis not present

## 2021-06-06 DIAGNOSIS — I4892 Unspecified atrial flutter: Secondary | ICD-10-CM | POA: Diagnosis not present

## 2021-06-06 DIAGNOSIS — J45909 Unspecified asthma, uncomplicated: Secondary | ICD-10-CM | POA: Diagnosis not present

## 2021-06-06 DIAGNOSIS — J9691 Respiratory failure, unspecified with hypoxia: Secondary | ICD-10-CM | POA: Diagnosis not present

## 2021-06-06 DIAGNOSIS — E1142 Type 2 diabetes mellitus with diabetic polyneuropathy: Secondary | ICD-10-CM | POA: Diagnosis not present

## 2021-06-06 DIAGNOSIS — L304 Erythema intertrigo: Secondary | ICD-10-CM | POA: Diagnosis not present

## 2021-06-06 DIAGNOSIS — M545 Low back pain, unspecified: Secondary | ICD-10-CM | POA: Diagnosis not present

## 2021-06-06 DIAGNOSIS — F32A Depression, unspecified: Secondary | ICD-10-CM | POA: Diagnosis not present

## 2021-06-06 DIAGNOSIS — J302 Other seasonal allergic rhinitis: Secondary | ICD-10-CM | POA: Diagnosis not present

## 2021-06-06 DIAGNOSIS — M14672 Charcot's joint, left ankle and foot: Secondary | ICD-10-CM | POA: Diagnosis not present

## 2021-06-06 DIAGNOSIS — Z9981 Dependence on supplemental oxygen: Secondary | ICD-10-CM | POA: Diagnosis not present

## 2021-06-06 DIAGNOSIS — G4733 Obstructive sleep apnea (adult) (pediatric): Secondary | ICD-10-CM | POA: Diagnosis not present

## 2021-06-06 DIAGNOSIS — I25119 Atherosclerotic heart disease of native coronary artery with unspecified angina pectoris: Secondary | ICD-10-CM | POA: Diagnosis not present

## 2021-06-06 DIAGNOSIS — E669 Obesity, unspecified: Secondary | ICD-10-CM | POA: Diagnosis not present

## 2021-06-06 DIAGNOSIS — I130 Hypertensive heart and chronic kidney disease with heart failure and stage 1 through stage 4 chronic kidney disease, or unspecified chronic kidney disease: Secondary | ICD-10-CM | POA: Diagnosis not present

## 2021-06-06 DIAGNOSIS — F419 Anxiety disorder, unspecified: Secondary | ICD-10-CM | POA: Diagnosis not present

## 2021-06-06 DIAGNOSIS — Z794 Long term (current) use of insulin: Secondary | ICD-10-CM | POA: Diagnosis not present

## 2021-06-06 DIAGNOSIS — I5022 Chronic systolic (congestive) heart failure: Secondary | ICD-10-CM | POA: Diagnosis not present

## 2021-06-06 LAB — CULTURE, BLOOD (ROUTINE X 2)
Culture: NO GROWTH
Culture: NO GROWTH
Special Requests: ADEQUATE
Special Requests: ADEQUATE

## 2021-06-07 DIAGNOSIS — T80212D Local infection due to central venous catheter, subsequent encounter: Secondary | ICD-10-CM | POA: Diagnosis not present

## 2021-06-07 DIAGNOSIS — J9691 Respiratory failure, unspecified with hypoxia: Secondary | ICD-10-CM | POA: Diagnosis not present

## 2021-06-07 DIAGNOSIS — M545 Low back pain, unspecified: Secondary | ICD-10-CM | POA: Diagnosis not present

## 2021-06-07 DIAGNOSIS — F419 Anxiety disorder, unspecified: Secondary | ICD-10-CM | POA: Diagnosis not present

## 2021-06-07 DIAGNOSIS — Z794 Long term (current) use of insulin: Secondary | ICD-10-CM | POA: Diagnosis not present

## 2021-06-07 DIAGNOSIS — J302 Other seasonal allergic rhinitis: Secondary | ICD-10-CM | POA: Diagnosis not present

## 2021-06-07 DIAGNOSIS — L304 Erythema intertrigo: Secondary | ICD-10-CM | POA: Diagnosis not present

## 2021-06-07 DIAGNOSIS — I130 Hypertensive heart and chronic kidney disease with heart failure and stage 1 through stage 4 chronic kidney disease, or unspecified chronic kidney disease: Secondary | ICD-10-CM | POA: Diagnosis not present

## 2021-06-07 DIAGNOSIS — M14672 Charcot's joint, left ankle and foot: Secondary | ICD-10-CM | POA: Diagnosis not present

## 2021-06-07 DIAGNOSIS — Z9981 Dependence on supplemental oxygen: Secondary | ICD-10-CM | POA: Diagnosis not present

## 2021-06-07 DIAGNOSIS — J45909 Unspecified asthma, uncomplicated: Secondary | ICD-10-CM | POA: Diagnosis not present

## 2021-06-07 DIAGNOSIS — I5022 Chronic systolic (congestive) heart failure: Secondary | ICD-10-CM | POA: Diagnosis not present

## 2021-06-07 DIAGNOSIS — E1142 Type 2 diabetes mellitus with diabetic polyneuropathy: Secondary | ICD-10-CM | POA: Diagnosis not present

## 2021-06-07 DIAGNOSIS — F32A Depression, unspecified: Secondary | ICD-10-CM | POA: Diagnosis not present

## 2021-06-07 DIAGNOSIS — G4733 Obstructive sleep apnea (adult) (pediatric): Secondary | ICD-10-CM | POA: Diagnosis not present

## 2021-06-07 DIAGNOSIS — I25119 Atherosclerotic heart disease of native coronary artery with unspecified angina pectoris: Secondary | ICD-10-CM | POA: Diagnosis not present

## 2021-06-07 DIAGNOSIS — E669 Obesity, unspecified: Secondary | ICD-10-CM | POA: Diagnosis not present

## 2021-06-07 DIAGNOSIS — I4892 Unspecified atrial flutter: Secondary | ICD-10-CM | POA: Diagnosis not present

## 2021-06-08 DIAGNOSIS — J9691 Respiratory failure, unspecified with hypoxia: Secondary | ICD-10-CM | POA: Diagnosis not present

## 2021-06-08 DIAGNOSIS — J45909 Unspecified asthma, uncomplicated: Secondary | ICD-10-CM | POA: Diagnosis not present

## 2021-06-08 DIAGNOSIS — E1142 Type 2 diabetes mellitus with diabetic polyneuropathy: Secondary | ICD-10-CM | POA: Diagnosis not present

## 2021-06-08 DIAGNOSIS — F419 Anxiety disorder, unspecified: Secondary | ICD-10-CM | POA: Diagnosis not present

## 2021-06-08 DIAGNOSIS — M545 Low back pain, unspecified: Secondary | ICD-10-CM | POA: Diagnosis not present

## 2021-06-08 DIAGNOSIS — I4892 Unspecified atrial flutter: Secondary | ICD-10-CM | POA: Diagnosis not present

## 2021-06-08 DIAGNOSIS — I25119 Atherosclerotic heart disease of native coronary artery with unspecified angina pectoris: Secondary | ICD-10-CM | POA: Diagnosis not present

## 2021-06-08 DIAGNOSIS — Z9981 Dependence on supplemental oxygen: Secondary | ICD-10-CM | POA: Diagnosis not present

## 2021-06-08 DIAGNOSIS — I5022 Chronic systolic (congestive) heart failure: Secondary | ICD-10-CM | POA: Diagnosis not present

## 2021-06-08 DIAGNOSIS — G4733 Obstructive sleep apnea (adult) (pediatric): Secondary | ICD-10-CM | POA: Diagnosis not present

## 2021-06-08 DIAGNOSIS — I130 Hypertensive heart and chronic kidney disease with heart failure and stage 1 through stage 4 chronic kidney disease, or unspecified chronic kidney disease: Secondary | ICD-10-CM | POA: Diagnosis not present

## 2021-06-08 DIAGNOSIS — Z794 Long term (current) use of insulin: Secondary | ICD-10-CM | POA: Diagnosis not present

## 2021-06-08 DIAGNOSIS — T80212D Local infection due to central venous catheter, subsequent encounter: Secondary | ICD-10-CM | POA: Diagnosis not present

## 2021-06-08 DIAGNOSIS — E669 Obesity, unspecified: Secondary | ICD-10-CM | POA: Diagnosis not present

## 2021-06-08 DIAGNOSIS — J302 Other seasonal allergic rhinitis: Secondary | ICD-10-CM | POA: Diagnosis not present

## 2021-06-08 DIAGNOSIS — F32A Depression, unspecified: Secondary | ICD-10-CM | POA: Diagnosis not present

## 2021-06-08 DIAGNOSIS — L304 Erythema intertrigo: Secondary | ICD-10-CM | POA: Diagnosis not present

## 2021-06-08 DIAGNOSIS — M14672 Charcot's joint, left ankle and foot: Secondary | ICD-10-CM | POA: Diagnosis not present

## 2021-06-08 NOTE — Telephone Encounter (Signed)
Transition Care Management Unsuccessful Follow-up Telephone Call ? ?Date of discharge and from where:  06/04/2021-[place]  ? ?Attempts:  2nd Attempt ? ?Reason for unsuccessful TCM follow-up call:  Left voice message ? ?  ?

## 2021-06-09 ENCOUNTER — Encounter (HOSPITAL_COMMUNITY): Payer: Self-pay

## 2021-06-09 ENCOUNTER — Ambulatory Visit (HOSPITAL_COMMUNITY)
Admit: 2021-06-09 | Discharge: 2021-06-09 | Disposition: A | Discharge status: Home / Self Care | Payer: Medicaid Other | Attending: Family Medicine | Admitting: Family Medicine

## 2021-06-09 VITALS — BP 108/72 | HR 105 | Wt 377.4 lb

## 2021-06-09 DIAGNOSIS — Z7189 Other specified counseling: Secondary | ICD-10-CM | POA: Diagnosis not present

## 2021-06-09 DIAGNOSIS — I480 Paroxysmal atrial fibrillation: Secondary | ICD-10-CM | POA: Diagnosis not present

## 2021-06-09 DIAGNOSIS — I5084 End stage heart failure: Secondary | ICD-10-CM | POA: Insufficient documentation

## 2021-06-09 DIAGNOSIS — J45909 Unspecified asthma, uncomplicated: Secondary | ICD-10-CM | POA: Insufficient documentation

## 2021-06-09 DIAGNOSIS — J9611 Chronic respiratory failure with hypoxia: Secondary | ICD-10-CM | POA: Diagnosis not present

## 2021-06-09 DIAGNOSIS — N183 Chronic kidney disease, stage 3 unspecified: Secondary | ICD-10-CM

## 2021-06-09 DIAGNOSIS — F32A Depression, unspecified: Secondary | ICD-10-CM | POA: Diagnosis not present

## 2021-06-09 DIAGNOSIS — Z9981 Dependence on supplemental oxygen: Secondary | ICD-10-CM | POA: Diagnosis not present

## 2021-06-09 DIAGNOSIS — T80212D Local infection due to central venous catheter, subsequent encounter: Secondary | ICD-10-CM | POA: Diagnosis not present

## 2021-06-09 DIAGNOSIS — E1122 Type 2 diabetes mellitus with diabetic chronic kidney disease: Secondary | ICD-10-CM | POA: Diagnosis not present

## 2021-06-09 DIAGNOSIS — G4733 Obstructive sleep apnea (adult) (pediatric): Secondary | ICD-10-CM | POA: Diagnosis not present

## 2021-06-09 DIAGNOSIS — I428 Other cardiomyopathies: Secondary | ICD-10-CM | POA: Insufficient documentation

## 2021-06-09 DIAGNOSIS — N1832 Chronic kidney disease, stage 3b: Secondary | ICD-10-CM | POA: Diagnosis not present

## 2021-06-09 DIAGNOSIS — E1142 Type 2 diabetes mellitus with diabetic polyneuropathy: Secondary | ICD-10-CM | POA: Diagnosis not present

## 2021-06-09 DIAGNOSIS — Z6841 Body Mass Index (BMI) 40.0 and over, adult: Secondary | ICD-10-CM | POA: Insufficient documentation

## 2021-06-09 DIAGNOSIS — I130 Hypertensive heart and chronic kidney disease with heart failure and stage 1 through stage 4 chronic kidney disease, or unspecified chronic kidney disease: Secondary | ICD-10-CM | POA: Diagnosis not present

## 2021-06-09 DIAGNOSIS — I5082 Biventricular heart failure: Secondary | ICD-10-CM | POA: Diagnosis not present

## 2021-06-09 DIAGNOSIS — E669 Obesity, unspecified: Secondary | ICD-10-CM | POA: Diagnosis not present

## 2021-06-09 DIAGNOSIS — I4892 Unspecified atrial flutter: Secondary | ICD-10-CM | POA: Diagnosis not present

## 2021-06-09 DIAGNOSIS — Z79899 Other long term (current) drug therapy: Secondary | ICD-10-CM | POA: Diagnosis not present

## 2021-06-09 DIAGNOSIS — E871 Hypo-osmolality and hyponatremia: Secondary | ICD-10-CM

## 2021-06-09 DIAGNOSIS — Z7901 Long term (current) use of anticoagulants: Secondary | ICD-10-CM | POA: Diagnosis not present

## 2021-06-09 DIAGNOSIS — J9610 Chronic respiratory failure, unspecified whether with hypoxia or hypercapnia: Secondary | ICD-10-CM

## 2021-06-09 DIAGNOSIS — I5022 Chronic systolic (congestive) heart failure: Secondary | ICD-10-CM | POA: Diagnosis not present

## 2021-06-09 DIAGNOSIS — E876 Hypokalemia: Secondary | ICD-10-CM

## 2021-06-09 DIAGNOSIS — L304 Erythema intertrigo: Secondary | ICD-10-CM | POA: Diagnosis not present

## 2021-06-09 DIAGNOSIS — Z7985 Long-term (current) use of injectable non-insulin antidiabetic drugs: Secondary | ICD-10-CM | POA: Insufficient documentation

## 2021-06-09 DIAGNOSIS — F419 Anxiety disorder, unspecified: Secondary | ICD-10-CM | POA: Diagnosis not present

## 2021-06-09 DIAGNOSIS — M14672 Charcot's joint, left ankle and foot: Secondary | ICD-10-CM | POA: Diagnosis not present

## 2021-06-09 DIAGNOSIS — Z794 Long term (current) use of insulin: Secondary | ICD-10-CM | POA: Diagnosis not present

## 2021-06-09 DIAGNOSIS — I25119 Atherosclerotic heart disease of native coronary artery with unspecified angina pectoris: Secondary | ICD-10-CM | POA: Diagnosis not present

## 2021-06-09 DIAGNOSIS — J9691 Respiratory failure, unspecified with hypoxia: Secondary | ICD-10-CM | POA: Diagnosis not present

## 2021-06-09 DIAGNOSIS — J302 Other seasonal allergic rhinitis: Secondary | ICD-10-CM | POA: Diagnosis not present

## 2021-06-09 DIAGNOSIS — M545 Low back pain, unspecified: Secondary | ICD-10-CM | POA: Diagnosis not present

## 2021-06-09 LAB — COMPREHENSIVE METABOLIC PANEL
ALT: 34 U/L (ref 0–44)
AST: 34 U/L (ref 15–41)
Albumin: 3.4 g/dL — ABNORMAL LOW (ref 3.5–5.0)
Alkaline Phosphatase: 148 U/L — ABNORMAL HIGH (ref 38–126)
Anion gap: 16 — ABNORMAL HIGH (ref 5–15)
BUN: 93 mg/dL — ABNORMAL HIGH (ref 6–20)
CO2: 23 mmol/L (ref 22–32)
Calcium: 9.3 mg/dL (ref 8.9–10.3)
Chloride: 85 mmol/L — ABNORMAL LOW (ref 98–111)
Creatinine, Ser: 2.11 mg/dL — ABNORMAL HIGH (ref 0.61–1.24)
GFR, Estimated: 41 mL/min — ABNORMAL LOW (ref >60)
Glucose, Bld: 218 mg/dL — ABNORMAL HIGH (ref 70–99)
Potassium: 3.4 mmol/L — ABNORMAL LOW (ref 3.5–5.1)
Sodium: 124 mmol/L — ABNORMAL LOW (ref 135–145)
Total Bilirubin: 1.5 mg/dL — ABNORMAL HIGH (ref 0.3–1.2)
Total Protein: 8.2 g/dL — ABNORMAL HIGH (ref 6.5–8.1)

## 2021-06-09 LAB — CBC
HCT: 34.7 % — ABNORMAL LOW (ref 39.0–52.0)
Hemoglobin: 11.5 g/dL — ABNORMAL LOW (ref 13.0–17.0)
MCH: 29.3 pg (ref 26.0–34.0)
MCHC: 33.1 g/dL (ref 30.0–36.0)
MCV: 88.3 fL (ref 80.0–100.0)
Platelets: 409 10*3/uL — ABNORMAL HIGH (ref 150–400)
RBC: 3.93 MIL/uL — ABNORMAL LOW (ref 4.22–5.81)
RDW: 16.6 % — ABNORMAL HIGH (ref 11.5–15.5)
WBC: 8.7 10*3/uL (ref 4.0–10.5)
nRBC: 0 % (ref 0.0–0.2)

## 2021-06-09 LAB — BRAIN NATRIURETIC PEPTIDE: B Natriuretic Peptide: 480.3 pg/mL — ABNORMAL HIGH (ref 0.0–100.0)

## 2021-06-09 MED ORDER — METOLAZONE 2.5 MG PO TABS: 2.5000 mg | ORAL_TABLET | ORAL | 5 refills | Status: AC

## 2021-06-09 NOTE — Progress Notes (Addendum)
? ?ADVANCED HF CLINIC NOTE ? ?PCP: [name], MD ?HF Cardiologist: Dr. Haroldine Laws  ? ?HPI: ?[name] is a 36 y.o.with a history of chronic HFrEF, NICM, HTN, asthma, OSA, morbid obesity, and uncontrolled DM. He is on home milrinone 0.375 mcg.  ?  ?Diagnosed with systolic HF in [place] [address]. EF 15-20% ? ?Repeat Echo 02/2017. EF had normalized to 55% but went back down when he was off HF meds. EF in 2021 back down to 30-35%. He has refused ICD.  ?  ?He has been followed in the HF clinic and was last seen 05/2020.  He cancelled follow up appointments 08/2020 and 09/2020.  ? ?Admitted 10/28/20 with A/C HFrEF, A fib RVR , and marked volume overload. Hospital course complicated by low output so milrinone started. Diuresed with lasix drip + metolazone+ diamox.   Failed milrinone wean and was discharged on milrinone. He was not a candidate for advanced therapies due to noncompliance and uncontrolled diabetes. HGB A1C >14, Palliative consulted for goals of care. He elected DNR/DNI. Referred to [facility] for Hospice services.  ? ?Seen in ED 11/25/20 with home milrinone pump issues.  ? ?Admitted 04/05/21 with sepsis and A/C HFrEF.  Had been on home milrinone through PICC. Blood CX Enterococcus faecalis and Staph epidermidis bacteremia. Placed on ceftriaxone + ampicllin. PICC removed. Diuresed with IV lasix.HF meds adjusted. PICC line replaced once cultures clear. Discharged on 2 weeks of linezolid. Discharged on milrinone 0.375 mcg. Plan to consider transplant if he can lose weight. Discharge weight 337 pounds. ? ?He was seen in the HF clinic 04/23/21 and was given 80 mg IV lasix. Volume overloaded in the setting of high sodium diet.  Returned the next week, volume mildly up and metolazone 2.5 + extra 40 KCL added weekly. Dr. Haroldine Laws discussed pathway to transplant. ? ?Saw PCP on 05/20/21 for abdominal pain. CDiff & GI PCR negative. Weight at that time was 25 pounds up.  ?  ?Re-admitted 4/23 with a/c CHF and cardiogenic shock  exacerbated by AFL with RVR. Milrinone increased to 0.5 and required NE + IABP. Started on amiodarone gtt and underwent successful DCCV. He was diuresed with IV lasix. CI remained low despite IABP and multiple pressors. Code status discussed and patient wished to remain full code. Met with family to discuss options  - patient wanted IABP out and to discharge home. IABP pulled, NE weaned off and discharged home with hospice on milrinone 0.5 (previously 0.375), weight 381 lbs. ?  ?Today he returns for HF follow up with wife and father. Overall feeling weak. He is SOB and dizzy with any activity. Swelling in legs and into abdomen. Continues with abdominal pain. Sleeps reclined in hospital bed. Denies palpitations or abnormal bleeding. Appetite fair. No fever or chills. Weight at home 367 pounds. Taking all medications. No issues with milrinone pump. Wife asking about heart/kidney transplant or if he should be on vitamins to strengthen heart and kidneys. ? ?Cardiac Studies ?- 02/2021 EF < 20% RV severely reduced.    ?- Echo 9/22: EF<20%, severely decreased LV, grade II DD.  ?- Echo 10/21: EF 30-35% with moderate RV dysfunction in setting of recurrent RBBB with significant dyssynchrony.  ?- Echo 04/2019: EF 30-35%  ?- Echo 2019: EF 55%   ?- Echo EF 15-20% in [place] in 2017. Cath around that time without  ? ?ROS: All systems negative except as listed in HPI, PMH and Problem List. ? ?SH:  ?Social History  ? ?Socioeconomic History  ? Marital  status: Married  ?  Spouse name: Not on file  ? Number of children: Not on file  ? Years of education: Not on file  ? Highest education level: Not on file  ?Occupational History  ? Not on file  ?Tobacco Use  ? Smoking status: Never  ? Smokeless tobacco: Never  ?Vaping Use  ? Vaping Use: Never used  ?Substance and Sexual Activity  ? Alcohol use: No  ? Drug use: No  ? Sexual activity: Never  ?Other Topics Concern  ? Not on file  ?Social History Narrative  ? Not on file  ? ?Social Determinants  of Health  ? ?Financial Resource Strain: Low Risk   ? Difficulty of Paying Living Expenses: Not very hard  ?Food Insecurity: No Food Insecurity  ? Worried About Charity fundraiser in the Last Year: Never true  ? Ran Out of Food in the Last Year: Never true  ?Transportation Needs: No Transportation Needs  ? Lack of Transportation (Medical): No  ? Lack of Transportation (Non-Medical): No  ?Physical Activity: Not on file  ?Stress: Not on file  ?Social Connections: Not on file  ?Intimate Partner Violence: Not on file  ? ?FH:  ?Family History  ?Problem Relation Age of Onset  ? Diabetes Mellitus II Mother   ? Heart failure Mother   ? Hypertension Mother   ? Heart disease Mother   ? Heart failure Father   ? Kidney disease Father   ? Heart disease Father   ? Congestive Heart Failure Neg Hx   ? ?Past Medical History:  ?Diagnosis Date  ? Acute exacerbation of CHF (congestive heart failure) ([place]) 04/05/2021  ? Acute on chronic heart failure ([place]) 10/28/2020  ? Acute pain of left foot 05/03/2017  ? Asthma   ? Asthma, chronic, mild persistent, uncomplicated 45/80/9983  ? Charcot ankle, left 05/31/2017  ? CHF (congestive heart failure) ([place])   ? Chronic systolic (congestive) heart failure (HCC)   ? Diabetes mellitus ([place])   ? Diabetes mellitus type 2 in obese ([place]) 04/21/2014  ? Diabetic polyneuropathy associated with type 2 diabetes mellitus ([place]) 03/14/2017  ? Essential hypertension 10/26/2016  ? Hypertension   ? Obesity   ? Obesity, Class III, BMI 40-49.9 (morbid obesity) ([place]) 10/26/2016  ? OSA (obstructive sleep apnea) 12/02/2016  ? Type 2 diabetes mellitus with diabetic neuropathy (HCC)   ? ?Current Outpatient Medications  ?Medication Sig Dispense Refill  ? Accu-Chek Softclix Lancets lancets Check blood sugar 3x a day 100 each 11  ? albuterol (VENTOLIN HFA) 108 (90 Base) MCG/ACT inhaler Inhale 2 puffs into the lungs every 4 (four) hours as needed for wheezing or shortness of breath. 18 g 0  ? amiodarone (PACERONE) 200 MG  tablet Take 1 tablet (200 mg total) by mouth daily. 31 tablet 5  ? apixaban (ELIQUIS) 5 MG TABS tablet Take 1 tablet (5 mg total) by mouth 2 (two) times daily. 60 tablet 2  ? azelastine (ASTELIN) 0.1 % nasal spray Place into both nostrils as needed for rhinitis. Use in each nostril as directed    ? blood glucose meter kit and supplies KIT Dispense based on patient and insurance preference. Use up to four times daily as directed. (FOR ICD-9 250.00, 250.01). For QAC - HS accuchecks. May switch to any brand. 1 each 1  ? Continuous Blood Gluc Sensor (FREESTYLE LIBRE 2 SENSOR) MISC Use to check blood sugar at least 6 times a day 2 each 11  ? [employer] SENNA 8.6  MG tablet Take 1 tablet by mouth daily as needed for constipation.    ? Dulaglutide (TRULICITY) 1.5 AV/4.0JW SOPN Inject 1 mg into the skin once a week. Patient takes every Monday.    ? gabapentin (NEURONTIN) 300 MG capsule Take 300 mg by mouth as needed (pain).    ? glucose blood (ACCU-CHEK GUIDE) test strip Check blood sugar 3 times per day 100 each 11  ? hyoscyamine (LEVSIN SL) 0.125 MG SL tablet Place 0.125 mg under the tongue every 4 (four) hours as needed for cramping (secretions).    ? insulin aspart (NOVOLOG) 100 UNIT/ML injection Inject 20 Units into the skin 3 (three) times daily with meals. 20 mL 11  ? insulin glargine (LANTUS) 100 UNIT/ML injection Inject 0.3 mLs (30 Units total) into the skin 2 (two) times daily. 20 mL 11  ? Insulin Syringe-Needle U-100 30G X 1/2" 1 ML MISC Use to inject insulin 4 times a day. 100 each 2  ? loratadine (CLARITIN) 10 MG tablet Take 1 tablet (10 mg total) by mouth 2 (two) times daily as needed for allergies (Can use an extra dose during flare ups.). 60 tablet 5  ? nitroGLYCERIN (NITROSTAT) 0.3 MG SL tablet Place 0.3 mg under the tongue every 5 (five) minutes as needed for chest pain.    ? ondansetron (ZOFRAN) 4 MG tablet Take 4 mg by mouth daily as needed for nausea or vomiting.    ? pantoprazole (PROTONIX) 40 MG tablet Take 1  tablet (40 mg total) by mouth daily. 30 tablet 1  ? potassium chloride SA (KLOR-CON M) 20 MEQ tablet Take 2 tablets (40 mEq total) by mouth 2 (two) times daily. Take extra 40 meq (2 tabs) every Thursday when yo

## 2021-06-09 NOTE — Telephone Encounter (Signed)
Transition Care Management Unsuccessful Follow-up Telephone Call ? ?Date of discharge and from where:  06/04/2021-[place]  ? ?Attempts:  3rd Attempt ? ?Reason for unsuccessful TCM follow-up call:  Unable to reach patient ? ? ? ?

## 2021-06-09 NOTE — Patient Instructions (Signed)
Thank you for coming in today ? ?Labs were done today, if any labs are abnormal the clinic will call you ?No news is good news  ? ?CHANGE metolazone to 2.5 mg 1 tablet every Tuesday(take today) and Friday ? ?Your physician recommends that you schedule a follow-up appointment in:  ?2 weeks in clinic  ? ?At the [facility], you and your health needs are our priority. As part of our continuing mission to provide you with exceptional heart care, we have created designated Provider Care Teams. These Care Teams include your primary Cardiologist (physician) and Advanced Practice Providers (APPs- Physician Assistants and Nurse Practitioners) who all work together to provide you with the care you need, when you need it.  ? ?You may see any of the following providers on your designated Care Team at your next follow up: ?Dr Arvilla Meres ?Dr Marca Ancona ?Tonye Becket, NP ?Robbie Lis, PA ?Jessica Milford,NP ?Anna Genre, PA ?Karle Plumber, PharmD ? ? ?Please be sure to bring in all your medications bottles to every appointment.  ? ?If you have any questions or concerns before your next appointment please send [place] a message through [place] or call our office at [phone].   ? ?TO LEAVE A MESSAGE FOR THE NURSE SELECT OPTION 2, PLEASE LEAVE A MESSAGE INCLUDING: ?YOUR NAME ?DATE OF BIRTH ?CALL BACK NUMBER ?REASON FOR CALL**this is important as we prioritize the call backs ? ?YOU WILL RECEIVE A CALL BACK THE SAME DAY AS LONG AS YOU CALL BEFORE 4:00 PM ? ?

## 2021-06-10 DIAGNOSIS — L304 Erythema intertrigo: Secondary | ICD-10-CM | POA: Diagnosis not present

## 2021-06-10 DIAGNOSIS — J9691 Respiratory failure, unspecified with hypoxia: Secondary | ICD-10-CM | POA: Diagnosis not present

## 2021-06-10 DIAGNOSIS — I130 Hypertensive heart and chronic kidney disease with heart failure and stage 1 through stage 4 chronic kidney disease, or unspecified chronic kidney disease: Secondary | ICD-10-CM | POA: Diagnosis not present

## 2021-06-10 DIAGNOSIS — G4733 Obstructive sleep apnea (adult) (pediatric): Secondary | ICD-10-CM | POA: Diagnosis not present

## 2021-06-10 DIAGNOSIS — M14672 Charcot's joint, left ankle and foot: Secondary | ICD-10-CM | POA: Diagnosis not present

## 2021-06-10 DIAGNOSIS — Z794 Long term (current) use of insulin: Secondary | ICD-10-CM | POA: Diagnosis not present

## 2021-06-10 DIAGNOSIS — Z9981 Dependence on supplemental oxygen: Secondary | ICD-10-CM | POA: Diagnosis not present

## 2021-06-10 DIAGNOSIS — E669 Obesity, unspecified: Secondary | ICD-10-CM | POA: Diagnosis not present

## 2021-06-10 DIAGNOSIS — M545 Low back pain, unspecified: Secondary | ICD-10-CM | POA: Diagnosis not present

## 2021-06-10 DIAGNOSIS — F32A Depression, unspecified: Secondary | ICD-10-CM | POA: Diagnosis not present

## 2021-06-10 DIAGNOSIS — E1142 Type 2 diabetes mellitus with diabetic polyneuropathy: Secondary | ICD-10-CM | POA: Diagnosis not present

## 2021-06-10 DIAGNOSIS — I4892 Unspecified atrial flutter: Secondary | ICD-10-CM | POA: Diagnosis not present

## 2021-06-10 DIAGNOSIS — J45909 Unspecified asthma, uncomplicated: Secondary | ICD-10-CM | POA: Diagnosis not present

## 2021-06-10 DIAGNOSIS — F419 Anxiety disorder, unspecified: Secondary | ICD-10-CM | POA: Diagnosis not present

## 2021-06-10 DIAGNOSIS — J302 Other seasonal allergic rhinitis: Secondary | ICD-10-CM | POA: Diagnosis not present

## 2021-06-10 DIAGNOSIS — I25119 Atherosclerotic heart disease of native coronary artery with unspecified angina pectoris: Secondary | ICD-10-CM | POA: Diagnosis not present

## 2021-06-10 DIAGNOSIS — T80212D Local infection due to central venous catheter, subsequent encounter: Secondary | ICD-10-CM | POA: Diagnosis not present

## 2021-06-10 DIAGNOSIS — I5022 Chronic systolic (congestive) heart failure: Secondary | ICD-10-CM | POA: Diagnosis not present

## 2021-06-11 DIAGNOSIS — J302 Other seasonal allergic rhinitis: Secondary | ICD-10-CM | POA: Diagnosis not present

## 2021-06-11 DIAGNOSIS — Z794 Long term (current) use of insulin: Secondary | ICD-10-CM | POA: Diagnosis not present

## 2021-06-11 DIAGNOSIS — F419 Anxiety disorder, unspecified: Secondary | ICD-10-CM | POA: Diagnosis not present

## 2021-06-11 DIAGNOSIS — T80212D Local infection due to central venous catheter, subsequent encounter: Secondary | ICD-10-CM | POA: Diagnosis not present

## 2021-06-11 DIAGNOSIS — M14672 Charcot's joint, left ankle and foot: Secondary | ICD-10-CM | POA: Diagnosis not present

## 2021-06-11 DIAGNOSIS — J45909 Unspecified asthma, uncomplicated: Secondary | ICD-10-CM | POA: Diagnosis not present

## 2021-06-11 DIAGNOSIS — I25119 Atherosclerotic heart disease of native coronary artery with unspecified angina pectoris: Secondary | ICD-10-CM | POA: Diagnosis not present

## 2021-06-11 DIAGNOSIS — E1142 Type 2 diabetes mellitus with diabetic polyneuropathy: Secondary | ICD-10-CM | POA: Diagnosis not present

## 2021-06-11 DIAGNOSIS — Z9981 Dependence on supplemental oxygen: Secondary | ICD-10-CM | POA: Diagnosis not present

## 2021-06-11 DIAGNOSIS — I5022 Chronic systolic (congestive) heart failure: Secondary | ICD-10-CM | POA: Diagnosis not present

## 2021-06-11 DIAGNOSIS — M545 Low back pain, unspecified: Secondary | ICD-10-CM | POA: Diagnosis not present

## 2021-06-11 DIAGNOSIS — G4733 Obstructive sleep apnea (adult) (pediatric): Secondary | ICD-10-CM | POA: Diagnosis not present

## 2021-06-11 DIAGNOSIS — L304 Erythema intertrigo: Secondary | ICD-10-CM | POA: Diagnosis not present

## 2021-06-11 DIAGNOSIS — I4892 Unspecified atrial flutter: Secondary | ICD-10-CM | POA: Diagnosis not present

## 2021-06-11 DIAGNOSIS — F32A Depression, unspecified: Secondary | ICD-10-CM | POA: Diagnosis not present

## 2021-06-11 DIAGNOSIS — I130 Hypertensive heart and chronic kidney disease with heart failure and stage 1 through stage 4 chronic kidney disease, or unspecified chronic kidney disease: Secondary | ICD-10-CM | POA: Diagnosis not present

## 2021-06-11 DIAGNOSIS — J9691 Respiratory failure, unspecified with hypoxia: Secondary | ICD-10-CM | POA: Diagnosis not present

## 2021-06-11 DIAGNOSIS — E669 Obesity, unspecified: Secondary | ICD-10-CM | POA: Diagnosis not present

## 2021-06-12 ENCOUNTER — Other Ambulatory Visit: Payer: Self-pay | Admitting: Internal Medicine

## 2021-06-12 DIAGNOSIS — I130 Hypertensive heart and chronic kidney disease with heart failure and stage 1 through stage 4 chronic kidney disease, or unspecified chronic kidney disease: Secondary | ICD-10-CM | POA: Diagnosis not present

## 2021-06-12 DIAGNOSIS — E1142 Type 2 diabetes mellitus with diabetic polyneuropathy: Secondary | ICD-10-CM | POA: Diagnosis not present

## 2021-06-12 DIAGNOSIS — T80212D Local infection due to central venous catheter, subsequent encounter: Secondary | ICD-10-CM | POA: Diagnosis not present

## 2021-06-12 DIAGNOSIS — J45909 Unspecified asthma, uncomplicated: Secondary | ICD-10-CM | POA: Diagnosis not present

## 2021-06-12 DIAGNOSIS — F419 Anxiety disorder, unspecified: Secondary | ICD-10-CM | POA: Diagnosis not present

## 2021-06-12 DIAGNOSIS — G4733 Obstructive sleep apnea (adult) (pediatric): Secondary | ICD-10-CM | POA: Diagnosis not present

## 2021-06-12 DIAGNOSIS — I5022 Chronic systolic (congestive) heart failure: Secondary | ICD-10-CM | POA: Diagnosis not present

## 2021-06-12 DIAGNOSIS — E669 Obesity, unspecified: Secondary | ICD-10-CM | POA: Diagnosis not present

## 2021-06-12 DIAGNOSIS — M14672 Charcot's joint, left ankle and foot: Secondary | ICD-10-CM | POA: Diagnosis not present

## 2021-06-12 DIAGNOSIS — I25119 Atherosclerotic heart disease of native coronary artery with unspecified angina pectoris: Secondary | ICD-10-CM | POA: Diagnosis not present

## 2021-06-12 DIAGNOSIS — J302 Other seasonal allergic rhinitis: Secondary | ICD-10-CM | POA: Diagnosis not present

## 2021-06-12 DIAGNOSIS — Z9981 Dependence on supplemental oxygen: Secondary | ICD-10-CM | POA: Diagnosis not present

## 2021-06-12 DIAGNOSIS — J9691 Respiratory failure, unspecified with hypoxia: Secondary | ICD-10-CM | POA: Diagnosis not present

## 2021-06-12 DIAGNOSIS — Z794 Long term (current) use of insulin: Secondary | ICD-10-CM | POA: Diagnosis not present

## 2021-06-12 DIAGNOSIS — I4892 Unspecified atrial flutter: Secondary | ICD-10-CM | POA: Diagnosis not present

## 2021-06-12 DIAGNOSIS — L304 Erythema intertrigo: Secondary | ICD-10-CM | POA: Diagnosis not present

## 2021-06-12 DIAGNOSIS — M545 Low back pain, unspecified: Secondary | ICD-10-CM | POA: Diagnosis not present

## 2021-06-12 DIAGNOSIS — F32A Depression, unspecified: Secondary | ICD-10-CM | POA: Diagnosis not present

## 2021-06-13 DIAGNOSIS — J9691 Respiratory failure, unspecified with hypoxia: Secondary | ICD-10-CM | POA: Diagnosis not present

## 2021-06-13 DIAGNOSIS — L304 Erythema intertrigo: Secondary | ICD-10-CM | POA: Diagnosis not present

## 2021-06-13 DIAGNOSIS — F32A Depression, unspecified: Secondary | ICD-10-CM | POA: Diagnosis not present

## 2021-06-13 DIAGNOSIS — Z794 Long term (current) use of insulin: Secondary | ICD-10-CM | POA: Diagnosis not present

## 2021-06-13 DIAGNOSIS — M545 Low back pain, unspecified: Secondary | ICD-10-CM | POA: Diagnosis not present

## 2021-06-13 DIAGNOSIS — T80212D Local infection due to central venous catheter, subsequent encounter: Secondary | ICD-10-CM | POA: Diagnosis not present

## 2021-06-13 DIAGNOSIS — G4733 Obstructive sleep apnea (adult) (pediatric): Secondary | ICD-10-CM | POA: Diagnosis not present

## 2021-06-13 DIAGNOSIS — E669 Obesity, unspecified: Secondary | ICD-10-CM | POA: Diagnosis not present

## 2021-06-13 DIAGNOSIS — Z9981 Dependence on supplemental oxygen: Secondary | ICD-10-CM | POA: Diagnosis not present

## 2021-06-13 DIAGNOSIS — M14672 Charcot's joint, left ankle and foot: Secondary | ICD-10-CM | POA: Diagnosis not present

## 2021-06-13 DIAGNOSIS — F419 Anxiety disorder, unspecified: Secondary | ICD-10-CM | POA: Diagnosis not present

## 2021-06-13 DIAGNOSIS — I5022 Chronic systolic (congestive) heart failure: Secondary | ICD-10-CM | POA: Diagnosis not present

## 2021-06-13 DIAGNOSIS — I25119 Atherosclerotic heart disease of native coronary artery with unspecified angina pectoris: Secondary | ICD-10-CM | POA: Diagnosis not present

## 2021-06-13 DIAGNOSIS — J45909 Unspecified asthma, uncomplicated: Secondary | ICD-10-CM | POA: Diagnosis not present

## 2021-06-13 DIAGNOSIS — I130 Hypertensive heart and chronic kidney disease with heart failure and stage 1 through stage 4 chronic kidney disease, or unspecified chronic kidney disease: Secondary | ICD-10-CM | POA: Diagnosis not present

## 2021-06-13 DIAGNOSIS — E1142 Type 2 diabetes mellitus with diabetic polyneuropathy: Secondary | ICD-10-CM | POA: Diagnosis not present

## 2021-06-13 DIAGNOSIS — J302 Other seasonal allergic rhinitis: Secondary | ICD-10-CM | POA: Diagnosis not present

## 2021-06-13 DIAGNOSIS — I4892 Unspecified atrial flutter: Secondary | ICD-10-CM | POA: Diagnosis not present

## 2021-06-14 ENCOUNTER — Emergency Department (HOSPITAL_COMMUNITY)
Admission: EM | Admit: 2021-06-14 | Discharge: 2021-06-14 | Disposition: A | Discharge status: Home / Self Care | Payer: Medicaid Other | Attending: Emergency Medicine | Admitting: Emergency Medicine

## 2021-06-14 ENCOUNTER — Emergency Department (HOSPITAL_COMMUNITY): Payer: Medicaid Other

## 2021-06-14 ENCOUNTER — Other Ambulatory Visit: Payer: Self-pay

## 2021-06-14 ENCOUNTER — Encounter (HOSPITAL_COMMUNITY): Payer: Self-pay

## 2021-06-14 DIAGNOSIS — E1142 Type 2 diabetes mellitus with diabetic polyneuropathy: Secondary | ICD-10-CM | POA: Diagnosis not present

## 2021-06-14 DIAGNOSIS — Z79899 Other long term (current) drug therapy: Secondary | ICD-10-CM | POA: Insufficient documentation

## 2021-06-14 DIAGNOSIS — L304 Erythema intertrigo: Secondary | ICD-10-CM | POA: Diagnosis not present

## 2021-06-14 DIAGNOSIS — M545 Low back pain, unspecified: Secondary | ICD-10-CM | POA: Diagnosis not present

## 2021-06-14 DIAGNOSIS — T80212D Local infection due to central venous catheter, subsequent encounter: Secondary | ICD-10-CM | POA: Diagnosis not present

## 2021-06-14 DIAGNOSIS — I5022 Chronic systolic (congestive) heart failure: Secondary | ICD-10-CM | POA: Diagnosis not present

## 2021-06-14 DIAGNOSIS — R570 Cardiogenic shock: Secondary | ICD-10-CM

## 2021-06-14 DIAGNOSIS — J80 Acute respiratory distress syndrome: Secondary | ICD-10-CM | POA: Diagnosis not present

## 2021-06-14 DIAGNOSIS — Z794 Long term (current) use of insulin: Secondary | ICD-10-CM | POA: Diagnosis not present

## 2021-06-14 DIAGNOSIS — I25119 Atherosclerotic heart disease of native coronary artery with unspecified angina pectoris: Secondary | ICD-10-CM | POA: Diagnosis not present

## 2021-06-14 DIAGNOSIS — Z7901 Long term (current) use of anticoagulants: Secondary | ICD-10-CM | POA: Diagnosis not present

## 2021-06-14 DIAGNOSIS — E875 Hyperkalemia: Secondary | ICD-10-CM | POA: Diagnosis not present

## 2021-06-14 DIAGNOSIS — N179 Acute kidney failure, unspecified: Secondary | ICD-10-CM

## 2021-06-14 DIAGNOSIS — E669 Obesity, unspecified: Secondary | ICD-10-CM | POA: Diagnosis not present

## 2021-06-14 DIAGNOSIS — J45909 Unspecified asthma, uncomplicated: Secondary | ICD-10-CM | POA: Diagnosis not present

## 2021-06-14 DIAGNOSIS — J302 Other seasonal allergic rhinitis: Secondary | ICD-10-CM | POA: Diagnosis not present

## 2021-06-14 DIAGNOSIS — M14672 Charcot's joint, left ankle and foot: Secondary | ICD-10-CM | POA: Diagnosis not present

## 2021-06-14 DIAGNOSIS — I110 Hypertensive heart disease with heart failure: Secondary | ICD-10-CM | POA: Diagnosis not present

## 2021-06-14 DIAGNOSIS — I5084 End stage heart failure: Secondary | ICD-10-CM

## 2021-06-14 DIAGNOSIS — Z515 Encounter for palliative care: Secondary | ICD-10-CM

## 2021-06-14 DIAGNOSIS — R001 Bradycardia, unspecified: Secondary | ICD-10-CM | POA: Diagnosis not present

## 2021-06-14 DIAGNOSIS — J9690 Respiratory failure, unspecified, unspecified whether with hypoxia or hypercapnia: Secondary | ICD-10-CM

## 2021-06-14 DIAGNOSIS — F419 Anxiety disorder, unspecified: Secondary | ICD-10-CM | POA: Diagnosis not present

## 2021-06-14 DIAGNOSIS — J9691 Respiratory failure, unspecified with hypoxia: Secondary | ICD-10-CM | POA: Diagnosis not present

## 2021-06-14 DIAGNOSIS — R0902 Hypoxemia: Secondary | ICD-10-CM | POA: Diagnosis not present

## 2021-06-14 DIAGNOSIS — R0689 Other abnormalities of breathing: Secondary | ICD-10-CM | POA: Diagnosis not present

## 2021-06-14 DIAGNOSIS — I5023 Acute on chronic systolic (congestive) heart failure: Secondary | ICD-10-CM

## 2021-06-14 DIAGNOSIS — I509 Heart failure, unspecified: Secondary | ICD-10-CM | POA: Diagnosis not present

## 2021-06-14 DIAGNOSIS — R0602 Shortness of breath: Secondary | ICD-10-CM | POA: Diagnosis not present

## 2021-06-14 DIAGNOSIS — R069 Unspecified abnormalities of breathing: Secondary | ICD-10-CM | POA: Diagnosis not present

## 2021-06-14 DIAGNOSIS — G4733 Obstructive sleep apnea (adult) (pediatric): Secondary | ICD-10-CM | POA: Diagnosis not present

## 2021-06-14 DIAGNOSIS — I130 Hypertensive heart and chronic kidney disease with heart failure and stage 1 through stage 4 chronic kidney disease, or unspecified chronic kidney disease: Secondary | ICD-10-CM | POA: Diagnosis not present

## 2021-06-14 DIAGNOSIS — I4892 Unspecified atrial flutter: Secondary | ICD-10-CM | POA: Diagnosis not present

## 2021-06-14 DIAGNOSIS — E1165 Type 2 diabetes mellitus with hyperglycemia: Secondary | ICD-10-CM | POA: Diagnosis not present

## 2021-06-14 DIAGNOSIS — F32A Depression, unspecified: Secondary | ICD-10-CM | POA: Diagnosis not present

## 2021-06-14 DIAGNOSIS — Z9981 Dependence on supplemental oxygen: Secondary | ICD-10-CM | POA: Diagnosis not present

## 2021-06-14 DIAGNOSIS — R0603 Acute respiratory distress: Secondary | ICD-10-CM | POA: Diagnosis present

## 2021-06-14 LAB — CBC WITH DIFFERENTIAL/PLATELET
Abs Immature Granulocytes: 0.07 10*3/uL (ref 0.00–0.07)
Basophils Absolute: 0 10*3/uL (ref 0.0–0.1)
Basophils Relative: 0 %
Eosinophils Absolute: 0 10*3/uL (ref 0.0–0.5)
Eosinophils Relative: 0 %
HCT: 36.4 % — ABNORMAL LOW (ref 39.0–52.0)
Hemoglobin: 11.8 g/dL — ABNORMAL LOW (ref 13.0–17.0)
Immature Granulocytes: 1 %
Lymphocytes Relative: 15 %
Lymphs Abs: 1.3 10*3/uL (ref 0.7–4.0)
MCH: 29.3 pg (ref 26.0–34.0)
MCHC: 32.4 g/dL (ref 30.0–36.0)
MCV: 90.3 fL (ref 80.0–100.0)
Monocytes Absolute: 0.7 10*3/uL (ref 0.1–1.0)
Monocytes Relative: 8 %
Neutro Abs: 6.5 10*3/uL (ref 1.7–7.7)
Neutrophils Relative %: 76 %
Platelets: 452 10*3/uL — ABNORMAL HIGH (ref 150–400)
RBC: 4.03 MIL/uL — ABNORMAL LOW (ref 4.22–5.81)
RDW: 17.3 % — ABNORMAL HIGH (ref 11.5–15.5)
WBC: 8.5 10*3/uL (ref 4.0–10.5)
nRBC: 0.5 % — ABNORMAL HIGH (ref 0.0–0.2)

## 2021-06-14 LAB — BASIC METABOLIC PANEL
Anion gap: 19 — ABNORMAL HIGH (ref 5–15)
BUN: 94 mg/dL — ABNORMAL HIGH (ref 6–20)
CO2: 16 mmol/L — ABNORMAL LOW (ref 22–32)
Calcium: 9.1 mg/dL (ref 8.9–10.3)
Chloride: 83 mmol/L — ABNORMAL LOW (ref 98–111)
Creatinine, Ser: 2.79 mg/dL — ABNORMAL HIGH (ref 0.61–1.24)
GFR, Estimated: 29 mL/min — ABNORMAL LOW (ref >60)
Glucose, Bld: 152 mg/dL — ABNORMAL HIGH (ref 70–99)
Potassium: 7 mmol/L (ref 3.5–5.1)
Sodium: 118 mmol/L — CL (ref 135–145)

## 2021-06-14 LAB — BRAIN NATRIURETIC PEPTIDE: B Natriuretic Peptide: 479 pg/mL — ABNORMAL HIGH (ref 0.0–100.0)

## 2021-06-14 LAB — TROPONIN I (HIGH SENSITIVITY): Troponin I (High Sensitivity): 73 ng/L — ABNORMAL HIGH (ref <18)

## 2021-06-14 LAB — MAGNESIUM: Magnesium: 2.3 mg/dL (ref 1.7–2.4)

## 2021-06-14 LAB — LACTIC ACID, PLASMA: Lactic Acid, Venous: 6.7 mmol/L (ref 0.5–1.9)

## 2021-06-14 MED ORDER — FUROSEMIDE 10 MG/ML IJ SOLN
160.0000 mg | Freq: Once | INTRAVENOUS | Status: AC
  Administered 2021-06-14: 160 mg via INTRAVENOUS
  Filled 2021-06-14: qty 10

## 2021-06-14 MED ORDER — SODIUM ZIRCONIUM CYCLOSILICATE 10 G PO PACK
10.0000 g | PACK | Freq: Once | ORAL | Status: AC
  Administered 2021-06-14: 10 g via ORAL
  Filled 2021-06-14: qty 1

## 2021-06-14 MED ORDER — MORPHINE SULFATE (PF) 2 MG/ML IV SOLN
2.0000 mg | Freq: Once | INTRAVENOUS | Status: AC
  Administered 2021-06-14: 2 mg via INTRAVENOUS
  Filled 2021-06-14: qty 1

## 2021-06-14 NOTE — Discharge Instructions (Signed)
We and your heart failure doctor, Dr. Clarise Cruz, are concerned that you are in the active dying process and believe you would benefit from admission for further comfort measures.  You are deciding to leave against our medical advice.  If you change your mind or if your symptoms get worse and you would like to receive further care at any moment, please return to ER for reassessment.  Otherwise please follow-up with [facility].  ?

## 2021-06-14 NOTE — ED Notes (Signed)
MD and RT at bedside. °

## 2021-06-14 NOTE — ED Triage Notes (Signed)
Pt arrived via EMS w/ c/o respiratory distress arrives on CPAP placed by EMS, pt followed by hospice care and remains a full code ?

## 2021-06-14 NOTE — Consult Note (Signed)
? ?ADVANCED HF TEAM CONSULT NOTE ? ?PCP: [name], MD ?HF Cardiologist: Dr. Haroldine Laws  ? ?HPI: ?Jonathan [name] is a 36 y.o.with a history of chronic HFrEF, NICM, HTN, asthma, OSA, morbid obesity, and uncontrolled DM. He is on home milrinone 0.375 mcg.  ?  ?Diagnosed with systolic HF in [place] [address]. EF 15-20% ? ?Repeat Echo 02/2017. EF had normalized to 55% but went back down when he was off HF meds. EF in 2021 back down to 30-35%. He has refused ICD.  ?  ?He has been followed in the HF clinic and was last seen 05/2020.  He cancelled follow up appointments 08/2020 and 09/2020.  ? ?Admitted 10/28/20 with A/C HFrEF, A fib RVR , and marked volume overload. Hospital course complicated by low output so milrinone started.  Failed milrinone wean and was discharged on milrinone. He was not a candidate for advanced therapies due to noncompliance, morbid obesity and uncontrolled diabetes. HGB A1C >14, Palliative consulted for goals of care. He elected DNR/DNI. Referred to [facility] for Hospice services.  ? ?Admitted 04/05/21 with sepsis and A/C HFrEF.  Had been on home milrinone through PICC. Blood CX Enterococcus faecalis and Staph epidermidis bacteremia. Placed on ceftriaxone + ampicllin.Discharged on 2 weeks of linezolid. Discharged on milrinone 0.375 mcg. Plan to consider transplant if he can lose weight. Discharge weight 337 pounds. ? ?Re-admitted 4/23 with a/c CHF and cardiogenic shock exacerbated by AFL with RVR. Milrinone increased to 0.5 and required NE + IABP. Started on amiodarone gtt and underwent successful DCCV. He was diuresed with IV lasix. CI remained low despite IABP and multiple pressors. Code status discussed and patient wished to remain full code. Met with family to discuss options  - patient wanted IABP out and to discharge home. IABP pulled, NE weaned off and discharged home with hospice on milrinone 0.5 (previously 0.375), weight 381 lbs. ?  ?Seen in HF Clinic earlier this week. With progressive decline and  fluid overload. Refused admission. Metolazone increased.  ? ?Came to ED today by EMS with florid cardiogenic shock manifested as lethargy, respiratory failure, ab pain and massive volume overload unresponsive to lasix. ? ?Labs back Scr 2.1 -> 2.8. Na 118 K 7.0  Lactic acid 6.7 ? ?Placed on bipap and mental status improved.  ? ?I explained gravity of situation to him and his wife and that he was actively dying from end-stage HF with no options for advanced therapies. I suggested admission for inpatient Hospice and comfort care but they refused. Saying if there is nothing left to do he wants to die at home. Continue to refuse changing code status to DNR/DNI as recommended by myself and Dr Roslynn Amble ? ?Review of Systems: [y] = yes, _0  = no   ?   ?General: Weight gain [y]; Weight loss _1 ; Anorexia _2 ; Fatigue [y]; Fever _3 ; Chills _4 ; Weakness Blue.Reese ]   ?Cardiac: Chest pain/pressure _5 ; Resting SOB [ y]; Exertional SOB [y]; Orthopnea [ y]; Pedal Edema [y]; Palpitations _6 ; Syncope _7 ; Presyncope _8 ; Paroxysmal nocturnal dyspnea_9    ?Pulmonary: Cough [ y]; Wheezing_10 ; Hemoptysis_11 ; Sputum _12 ; Snoring _13    ?GI: Vomiting_14 ; Dysphagia_15 ; Melena_16 ; Hematochezia _17 ; Heartburn_18 ; Abdominal pain Blue.Reese ]; Constipation _19 ; Diarrhea _20 ; BRBPR _21    ?GU: Hematuria_22 ; Dysuria _23 ; Nocturia_24  ?Vascular: Pain in legs with walking _25 ; Pain in feet with lying flat _26 ; Non-healing sores _27 ;  Stroke _0 ; TIA _1 ; Slurred speech _2 ;   ?Neuro: Headaches_3 ; Vertigo_4 ; Seizures_5 ; Paresthesias_6 ;Blurred vision _7 ; Diplopia _8 ; Vision changes _9    ?Ortho/Skin: Arthritis [y]; Joint pain [y]; Muscle pain _10 ; Joint swelling _11 ; Back Pain Blue.Reese ]; Rash _12    ?Psych: Depression_13 ; Anxiety_14 y   ?Heme: Bleeding problems _15 ; Clotting disorders _16 ; Anemia _17    ?Endocrine: Diabetes Blue.Reese ]; Thyroid dysfunction_18  ? ? ?SH:  ?Social History  ? ?Socioeconomic History  ? Marital status: Married  ?  Spouse name: Not on file  ?  Number of children: Not on file  ? Years of education: Not on file  ? Highest education level: Not on file  ?Occupational History  ? Not on file  ?Tobacco Use  ? Smoking status: Never  ? Smokeless tobacco: Never  ?Vaping Use  ? Vaping Use: Never used  ?Substance and Sexual Activity  ? Alcohol use: No  ? Drug use: No  ? Sexual activity: Never  ?Other Topics Concern  ? Not on file  ?Social History Narrative  ? Not on file  ? ?Social Determinants of Health  ? ?Financial Resource Strain: Low Risk   ? Difficulty of Paying Living Expenses: Not very hard  ?Food Insecurity: No Food Insecurity  ? Worried About Charity fundraiser in the Last Year: Never true  ? Ran Out of Food in the Last Year: Never true  ?Transportation Needs: No Transportation Needs  ? Lack of Transportation (Medical): No  ? Lack of Transportation (Non-Medical): No  ?Physical Activity: Not on file  ?Stress: Not on file  ?Social Connections: Not on file  ?Intimate Partner Violence: Not on file  ? ?FH:  ?Family History  ?Problem Relation Age of Onset  ? Diabetes Mellitus II Mother   ? Heart failure Mother   ? Hypertension Mother   ? Heart disease Mother   ? Heart failure Father   ? Kidney disease Father   ? Heart disease Father   ? Congestive Heart Failure Neg Hx   ? ?Past Medical History:  ?Diagnosis Date  ? Acute exacerbation of CHF (congestive heart failure) ([place]) 04/05/2021  ? Acute on chronic heart failure ([place]) 10/28/2020  ? Acute pain of left foot 05/03/2017  ? Asthma   ? Asthma, chronic, mild persistent, uncomplicated 25/95/6387  ? Charcot ankle, left 05/31/2017  ? CHF (congestive heart failure) ([place])   ? Chronic systolic (congestive) heart failure (HCC)   ? Diabetes mellitus ([place])   ? Diabetes mellitus type 2 in obese ([place]) 04/21/2014  ? Diabetic polyneuropathy associated with type 2 diabetes mellitus ([place]) 03/14/2017  ? Essential hypertension 10/26/2016  ? Hypertension   ? Obesity   ? Obesity, Class III, BMI 40-49.9 (morbid obesity) ([place]) 10/26/2016   ? OSA (obstructive sleep apnea) 12/02/2016  ? Type 2 diabetes mellitus with diabetic neuropathy (HCC)   ? ?Current Facility-Administered Medications  ?Medication Dose Route Frequency Provider Last Rate Last Admin  ? furosemide (LASIX) 160 mg in dextrose 5 % 50 mL IVPB  160 mg Intravenous Once Lucrezia Starch, MD      ? morphine (PF) 2 MG/ML injection 2 mg  2 mg Intravenous Once Lucrezia Starch, MD      ? sodium zirconium cyclosilicate (LOKELMA) packet 10 g  10 g Oral Once Lucrezia Starch, MD      ? ?Current Outpatient Medications  ?Medication Sig Dispense Refill  ?  Accu-Chek Softclix Lancets lancets Check blood sugar 3x a day 100 each 11  ? albuterol (VENTOLIN HFA) 108 (90 Base) MCG/ACT inhaler Inhale 2 puffs into the lungs every 4 (four) hours as needed for wheezing or shortness of breath. 18 g 0  ? amiodarone (PACERONE) 200 MG tablet Take 1 tablet (200 mg total) by mouth daily. 31 tablet 5  ? apixaban (ELIQUIS) 5 MG TABS tablet Take 1 tablet (5 mg total) by mouth 2 (two) times daily. 60 tablet 2  ? azelastine (ASTELIN) 0.1 % nasal spray Place into both nostrils as needed for rhinitis. Use in each nostril as directed    ? blood glucose meter kit and supplies KIT Dispense based on patient and insurance preference. Use up to four times daily as directed. (FOR ICD-9 250.00, 250.01). For QAC - HS accuchecks. May switch to any brand. 1 each 1  ? Continuous Blood Gluc Sensor (FREESTYLE LIBRE 2 SENSOR) MISC Use to check blood sugar at least 6 times a day 2 each 11  ? [employer] SENNA 8.6 MG tablet Take 1 tablet by mouth daily as needed for constipation.    ? Dulaglutide (TRULICITY) 1.5 YT/0.3TW SOPN Inject 1 mg into the skin once a week. Patient takes every Monday.    ? gabapentin (NEURONTIN) 300 MG capsule Take 300 mg by mouth as needed (pain).    ? glucose blood (ACCU-CHEK GUIDE) test strip Check blood sugar 3 times per day 100 each 11  ? hyoscyamine (LEVSIN SL) 0.125 MG SL tablet Place 0.125 mg under the tongue  every 4 (four) hours as needed for cramping (secretions).    ? insulin aspart (NOVOLOG) 100 UNIT/ML injection Inject 20 Units into the skin 3 (three) times daily with meals. 20 mL 11  ? insulin glargine (LANTUS

## 2021-06-14 NOTE — ED Provider Notes (Signed)
?[facility] ?Provider Note ? ? ?CSN: [id number] ?Arrival date & time: 06/14/21  1148 ? ?  ? ?History ? ?Chief Complaint  ?Patient presents with  ? Respiratory Distress  ? ? ?Jonathan [name] is a 36 y.o. male.  Presents to ER for respiratory distress.  Patient has end-stage heart failure, recently admitted to heart failure service.  He was discharged on home hospice.  Since discharge he was having worsening shortness of breath, additionally having abdominal pain.  Wife reports that swelling in his legs has also been getting worse.  They state they are aware of his clinical condition. ? ?Completed chart review, reviewed last discharge summary - HFrEF, NICM, HTN, asthma, OSA, morbid obesity, and uncontrolled DM.  He was placed on Levophed infusion, aortic balloon pump placed.  Eventually patient and family decided - - patient wanted IABP out and to discharge home. IABP pulled on 04/19. NE weaned off and discharged home with hospice on milrinone 0.5 (previously 0.375).  ? ?HPI ? ?  ? ?Home Medications ?Prior to Admission medications   ?Medication Sig Start Date End Date Taking? Authorizing Provider  ?Accu-Chek Softclix Lancets lancets Check blood sugar 3x a day 06/23/20   Asencion Noble, MD  ?albuterol (VENTOLIN HFA) 108 (90 Base) MCG/ACT inhaler Inhale 2 puffs into the lungs every 4 (four) hours as needed for wheezing or shortness of breath. 04/03/21   Kennith Gain, MD  ?amiodarone (PACERONE) 200 MG tablet Take 1 tablet (200 mg total) by mouth daily. 06/05/21   Joette Catching, PA-C  ?apixaban (ELIQUIS) 5 MG TABS tablet Take 1 tablet (5 mg total) by mouth 2 (two) times daily. 11/12/20   Farrel Gordon, DO  ?azelastine (ASTELIN) 0.1 % nasal spray Place into both nostrils as needed for rhinitis. Use in each nostril as directed    [provider]  ?blood glucose meter kit and supplies KIT Dispense based on patient and insurance preference. Use up to four times  daily as directed. (FOR ICD-9 250.00, 250.01). For QAC - HS accuchecks. May switch to any brand. 10/29/16   Thurnell Lose, MD  ?Continuous Blood Gluc Sensor (FREESTYLE LIBRE 2 SENSOR) MISC Use to check blood sugar at least 6 times a day 12/08/20   Lacinda Axon, MD  ?[employer] SENNA 8.6 MG tablet Take 1 tablet by mouth daily as needed for constipation. 03/15/21   [provider]  ?Dulaglutide (TRULICITY) 1.5 VQ/2.5ZD SOPN Inject 1 mg into the skin once a week. Patient takes every Monday.    [provider]  ?gabapentin (NEURONTIN) 300 MG capsule Take 300 mg by mouth as needed (pain).    [provider]  ?glucose blood (ACCU-CHEK GUIDE) test strip Check blood sugar 3 times per day 06/23/20   [name], MD  ?hyoscyamine (LEVSIN SL) 0.125 MG SL tablet Place 0.125 mg under the tongue every 4 (four) hours as needed for cramping (secretions). 12/14/20   [provider]  ?insulin aspart (NOVOLOG) 100 UNIT/ML injection Inject 20 Units into the skin 3 (three) times daily with meals. 11/12/20   Farrel Gordon, DO  ?insulin glargine (LANTUS) 100 UNIT/ML injection Inject 0.3 mLs (30 Units total) into the skin 2 (two) times daily. 11/12/20   Farrel Gordon, DO  ?Insulin Syringe-Needle U-100 30G X 1/2" 1 ML MISC Use to inject insulin 4 times a day. 11/12/20   Sid Falcon, MD  ?loratadine (CLARITIN) 10 MG tablet Take 1 tablet (10 mg total) by mouth  2 (two) times daily as needed for allergies (Can use an extra dose during flare ups.). 01/14/21   Kennith Gain, MD  ?metolazone (ZAROXOLYN) 2.5 MG tablet Take 1 tablet (2.5 mg total) by mouth 2 (two) times a week. Take 1 tablet on Tuesdays and Fridays 06/11/21   Rafael Bihari, FNP  ?nitroGLYCERIN (NITROSTAT) 0.3 MG SL tablet Place 0.3 mg under the tongue every 5 (five) minutes as needed for chest pain. 02/27/21   [provider]  ?ondansetron (ZOFRAN) 4 MG tablet Take 4 mg by mouth daily as needed for nausea or vomiting.  12/14/20   [provider]  ?pantoprazole (PROTONIX) 40 MG tablet TAKE 1 TABLET BY MOUTH EVERY DAY 06/14/21   [name], MD  ?potassium chloride SA (KLOR-CON M) 20 MEQ tablet Take 2 tablets (40 mEq total) by mouth 2 (two) times daily. Take extra 40 meq (2 tabs) every Thursday when you take Metolazone 04/29/21 06/09/21  Bensimhon, Shaune Pascal, MD  ?spironolactone (ALDACTONE) 25 MG tablet Take 0.5 tablets (12.5 mg total) by mouth daily. 06/04/21   Joette Catching, PA-C  ?tirzepatide [facility]) 2.5 MG/0.5ML Pen INJECT 2.5 MG INTO THE SKIN ONCE A WEEK FOR 28 DAYS, THEN 5 MG ONCE A WEEK. ?Patient not taking: Reported on 06/09/2021 05/27/21   [name], MD  ?torsemide (DEMADEX) 100 MG tablet Take 1 tablet (100 mg total) by mouth 2 (two) times daily. 06/04/21   Joette Catching, PA-C  ?traMADol (ULTRAM) 50 MG tablet TAKE 1 TABLET BY MOUTH EVERY 6 HOURS AS NEEDED. ?Patient taking differently: Take 50 mg by mouth every 6 (six) hours as needed for moderate pain. 03/12/21   [name], MD  ?   ? ?Allergies    ?Hydrocodone   ? ?Review of Systems   ?Review of Systems  ?Unable to perform ROS: Acuity of condition  ? ?Physical Exam ?Updated Vital Signs ?BP 102/71   Pulse 96   Resp 20   SpO2 92%  ?Physical Exam ?Constitutional:   ?   Comments: Appears to be in respiratory distress, increased work of breathing, bilateral rales, CPAP from EMS and placed  ?HENT:  ?   Head: Normocephalic and atraumatic.  ?   Mouth/Throat:  ?   Mouth: Mucous membranes are moist.  ?Eyes:  ?   Extraocular Movements: Extraocular movements intact.  ?   Pupils: Pupils are equal, round, and reactive to light.  ?Cardiovascular:  ?   Rate and Rhythm: Tachycardia present.  ?   Pulses: Normal pulses.  ?Pulmonary:  ?   Comments: There is some tachypnea and increased work of breathing, bilateral Rales noted ?Abdominal:  ?   Comments: Somewhat distended, pitting edema to level of mid abdomen  ?Musculoskeletal:  ?   Comments: Pitting edema  in both of his legs extending up to mid abdomen  ?Skin: ?   General: Skin is warm and dry.  ?Neurological:  ?   Comments: Alert, oriented x3  ?Psychiatric:     ?   Mood and Affect: Mood normal.  ? ? ?ED Results / Procedures / Treatments   ?Labs ?(all labs ordered are listed, but only abnormal results are displayed) ?Labs Reviewed  ?CBC WITH DIFFERENTIAL/PLATELET - Abnormal; Notable for the following components:  ?    Result Value  ? RBC 4.03 (*)   ? Hemoglobin 11.8 (*)   ? HCT 36.4 (*)   ? RDW 17.3 (*)   ? Platelets 452 (*)   ? nRBC 0.5 (*)   ?  All other components within normal limits  ?BASIC METABOLIC PANEL - Abnormal; Notable for the following components:  ? Sodium 118 (*)   ? Potassium 7.0 (*)   ? Chloride 83 (*)   ? CO2 16 (*)   ? Glucose, Bld 152 (*)   ? BUN 94 (*)   ? Creatinine, Ser 2.79 (*)   ? GFR, Estimated 29 (*)   ? Anion gap 19 (*)   ? All other components within normal limits  ?LACTIC ACID, PLASMA - Abnormal; Notable for the following components:  ? Lactic Acid, Venous 6.7 (*)   ? All other components within normal limits  ?TROPONIN I (HIGH SENSITIVITY) - Abnormal; Notable for the following components:  ? Troponin I (High Sensitivity) 73 (*)   ? All other components within normal limits  ?MAGNESIUM  ?BRAIN NATRIURETIC PEPTIDE  ?LACTIC ACID, PLASMA  ?TROPONIN I (HIGH SENSITIVITY)  ? ? ?EKG ?EKG Interpretation ? ?Date/Time:  Sunday June 14 2021 11:51:38 EDT ?Ventricular Rate:  96 ?PR Interval:  63 ?QRS Duration: 176 ?QT Interval:  402 ?QTC Calculation: 508 ?R Axis:   187 ?Text Interpretation: Sinus rhythm Short PR interval RBBB and LPFB Confirmed by Madalyn Rob [id number]) on 06/14/2021 12:39:33 PM ? ?Radiology ?DG Chest Portable 1 View ? ?Result Date: 06/14/2021 ?CLINICAL DATA:  Shortness of breath EXAM: PORTABLE CHEST 1 VIEW COMPARISON:  06/01/2021 FINDINGS: Interval removal of right IJ pulmonary arterial catheter and balloon pump. Right PICC line remains in place the level of the proximal SVC. Mild  cardiomegaly. Low lung volumes with streaky bibasilar airspace opacities. No large pleural fluid collection. No pneumothorax. IMPRESSION: Low lung volumes with streaky bibasilar airspace opacities, atelectasis

## 2021-06-14 NOTE — ED Notes (Signed)
Pts wife is refusing CBG monitoring at this time ?

## 2021-06-14 NOTE — ED Notes (Signed)
Pts wife stated she gave pt insulin right before the ambulance arrived to take him, pts wife refusing CBG monitoring at this time stating that "it won't be accurate because I just gave him insulin" educated pts wife that fast acting insulin would be taking effect by now, pts wife continued to refuse CBG monitoring ?

## 2021-06-14 NOTE — ED Notes (Signed)
Called [facility] to notify of pts arrival per BPA firing and chart recommendations ?

## 2021-06-14 NOTE — ED Notes (Signed)
Pts wife requested I speak to the doctor about the plan of care, updated pts wife on plan of care to get cardiology team involved, pts wife stated "you are doing nothing for him, your presence is not required, please leave"  ?

## 2021-06-16 ENCOUNTER — Telehealth: Payer: Self-pay

## 2021-06-16 DIAGNOSIS — R1084 Generalized abdominal pain: Secondary | ICD-10-CM | POA: Diagnosis not present

## 2021-06-16 DIAGNOSIS — N289 Disorder of kidney and ureter, unspecified: Secondary | ICD-10-CM | POA: Diagnosis not present

## 2021-06-16 DIAGNOSIS — R188 Other ascites: Secondary | ICD-10-CM | POA: Diagnosis not present

## 2021-06-16 DIAGNOSIS — I509 Heart failure, unspecified: Secondary | ICD-10-CM | POA: Diagnosis not present

## 2021-06-16 NOTE — Telephone Encounter (Signed)
Transition Care Management Follow-up Telephone Call ?Date of discharge and from where: 06/14/2021 from  [facility] ?How have you been since you were released from the hospital? Patient stated that he did not have any questions or concerns at this time.  ?Any questions or concerns? No ? ?Items Reviewed: ?Did the pt receive and understand the discharge instructions provided? Yes  ?Medications obtained and verified? Yes  ?Other? No  ?Any new allergies since your discharge? No  ?Dietary orders reviewed? No ?Do you have support at home? Yes  ? ?Functional Questionnaire: (I = Independent and D = Dependent) ?ADLs: I ? ?Bathing/Dressing- I ? ?Meal Prep- I ? ?Eating- I ? ?Maintaining continence- I ? ?Transferring/Ambulation- I ? ?Managing Meds- I ? ? ?Follow up appointments reviewed: ? ?PCP Hospital f/u appt confirmed? No   ?Specialist Hospital f/u appt confirmed? Yes  Scheduled to see Heart and Vascular on 06/23/2021 @ 12:00pm. ?Are transportation arrangements needed? No  ?If their condition worsens, is the pt aware to call PCP or go to the Emergency Dept.? Yes ?Was the patient provided with contact information for the PCP's office or ED? Yes ?Was to pt encouraged to call back with questions or concerns? Yes ? ?

## 2021-06-16 NOTE — Telephone Encounter (Signed)
DECISION: ? ? ? ?Approved today ? ? ?Request Reference Number: [id number]. TRULICITY INJ 0.75/0.5 is ((((((approved )))))) ? ? ? ?through 06/17/2022. ? ? ? ? For further questions, call [employer] at [phone]. ?Drug ?Trulicity 0.75MG /0.5ML pen-injectors ?Form ?[employer] Electronic Prior Authorization Form [phone] NCPDP) ? ? ( COPY SENT TO PHARMACY ALSO )  ?

## 2021-06-16 NOTE — Telephone Encounter (Signed)
Another pa for pt ( TRULICITY ) came through on cover my meds was submitted with last office notes ... hopefully after so many tries we get an approval  ?

## 2021-06-17 ENCOUNTER — Encounter (HOSPITAL_COMMUNITY): Payer: Medicaid Other

## 2021-06-19 ENCOUNTER — Telehealth (HOSPITAL_COMMUNITY): Payer: Self-pay | Admitting: Adult Health

## 2021-06-19 NOTE — Telephone Encounter (Signed)
? ?  Called by [facility], Clydia Llano RN  ? ?Jonathan [name] has end stage heart failure and is followed by [facility] for =Hospcie Services   ? ?Called regarding worsening shortness of breath.  ? ?Give 80 mg IV lasix  x1 .  ? ?Will fax order to [facility].  ? ?Darrick Grinder NP- C ?3:45 PM ? ? ? ?

## 2021-06-20 DIAGNOSIS — J9600 Acute respiratory failure, unspecified whether with hypoxia or hypercapnia: Secondary | ICD-10-CM | POA: Diagnosis not present

## 2021-06-20 DIAGNOSIS — W19XXXA Unspecified fall, initial encounter: Secondary | ICD-10-CM | POA: Diagnosis not present

## 2021-06-20 DIAGNOSIS — R0789 Other chest pain: Secondary | ICD-10-CM | POA: Diagnosis not present

## 2021-06-20 DIAGNOSIS — R079 Chest pain, unspecified: Secondary | ICD-10-CM | POA: Diagnosis not present

## 2021-06-20 DIAGNOSIS — R404 Transient alteration of awareness: Secondary | ICD-10-CM | POA: Diagnosis not present

## 2021-06-22 NOTE — Progress Notes (Incomplete)
? ?ADVANCED HF CLINIC NOTE ? ?PCP: [name], MD ?HF Cardiologist: Dr. Haroldine Laws  ? ?HPI: ?Jonathan [name] is a 36 y.o.with a history of chronic HFrEF, NICM, HTN, asthma, OSA, morbid obesity, and uncontrolled DM. He is on home milrinone 0.375 mcg.  ?  ?Diagnosed with systolic HF in [place] [address]. EF 15-20% ? ?Repeat Echo 02/2017. EF had normalized to 55% but went back down when he was off HF meds. EF in 2021 back down to 30-35%. He has refused ICD.  ?  ?He has been followed in the HF clinic and was last seen 05/2020.  He cancelled follow up appointments 08/2020 and 09/2020.  ? ?Admitted 10/28/20 with A/C HFrEF, A fib RVR , and marked volume overload. Hospital course complicated by low output so milrinone started. Diuresed with lasix drip + metolazone+ diamox.   Failed milrinone wean and was discharged on milrinone. He was not a candidate for advanced therapies due to noncompliance and uncontrolled diabetes. HGB A1C >14, Palliative consulted for goals of care. He elected DNR/DNI. Referred to [facility] for Hospice services.  ? ?Seen in ED 11/25/20 with home milrinone pump issues.  ? ?Admitted 04/05/21 with sepsis and A/C HFrEF.  Had been on home milrinone through PICC. Blood CX Enterococcus faecalis and Staph epidermidis bacteremia. Placed on ceftriaxone + ampicllin. PICC removed. Diuresed with IV lasix.HF meds adjusted. PICC line replaced once cultures clear. Discharged on 2 weeks of linezolid. Discharged on milrinone 0.375 mcg. Plan to consider transplant if he can lose weight. Discharge weight 337 pounds. ? ?He was seen in the HF clinic 04/23/21 and was given 80 mg IV lasix. Volume overloaded in the setting of high sodium diet.  Returned the next week, volume mildly up and metolazone 2.5 + extra 40 KCL added weekly. Dr. Haroldine Laws discussed pathway to transplant. ? ?Saw PCP on 05/20/21 for abdominal pain. CDiff & GI PCR negative. Weight at that time was 25 pounds up.  ?  ?Re-admitted 4/23 with a/c CHF and cardiogenic shock  exacerbated by AFL with RVR. Milrinone increased to 0.5 and required NE + IABP. Started on amiodarone gtt and underwent successful DCCV. He was diuresed with IV lasix. CI remained low despite IABP and multiple pressors. Code status discussed and patient wished to remain full code. Met with family to discuss options  - patient wanted IABP out and to discharge home. IABP pulled, NE weaned off and discharged home with hospice on milrinone 0.5 (previously 0.375), weight 381 lbs. ?  ?Today he returns for HF follow up with wife and father. Overall feeling weak. He is SOB and dizzy with any activity. Swelling in legs and into abdomen. Continues with abdominal pain. Sleeps reclined in hospital bed. Denies palpitations or abnormal bleeding. Appetite fair. No fever or chills. Weight at home 367 pounds. Taking all medications. No issues with milrinone pump. Wife asking about heart/kidney transplant or if he should be on vitamins to strengthen heart and kidneys. ? ?Cardiac Studies ?- 02/2021 EF < 20% RV severely reduced.    ?- Echo 9/22: EF<20%, severely decreased LV, grade II DD.  ?- Echo 10/21: EF 30-35% with moderate RV dysfunction in setting of recurrent RBBB with significant dyssynchrony.  ?- Echo 04/2019: EF 30-35%  ?- Echo 2019: EF 55%   ?- Echo EF 15-20% in [place] in 2017. Cath around that time without  ? ?ROS: All systems negative except as listed in HPI, PMH and Problem List. ? ?SH:  ?Social History  ? ?Socioeconomic History  ? Marital  status: Married  ?  Spouse name: Not on file  ? Number of children: Not on file  ? Years of education: Not on file  ? Highest education level: Not on file  ?Occupational History  ? Not on file  ?Tobacco Use  ? Smoking status: Never  ? Smokeless tobacco: Never  ?Vaping Use  ? Vaping Use: Never used  ?Substance and Sexual Activity  ? Alcohol use: No  ? Drug use: No  ? Sexual activity: Never  ?Other Topics Concern  ? Not on file  ?Social History Narrative  ? Not on file  ? ?Social Determinants  of Health  ? ?Financial Resource Strain: Low Risk   ? Difficulty of Paying Living Expenses: Not very hard  ?Food Insecurity: No Food Insecurity  ? Worried About Charity fundraiser in the Last Year: Never true  ? Ran Out of Food in the Last Year: Never true  ?Transportation Needs: No Transportation Needs  ? Lack of Transportation (Medical): No  ? Lack of Transportation (Non-Medical): No  ?Physical Activity: Not on file  ?Stress: Not on file  ?Social Connections: Not on file  ?Intimate Partner Violence: Not on file  ? ?FH:  ?Family History  ?Problem Relation Age of Onset  ? Diabetes Mellitus II Mother   ? Heart failure Mother   ? Hypertension Mother   ? Heart disease Mother   ? Heart failure Father   ? Kidney disease Father   ? Heart disease Father   ? Congestive Heart Failure Neg Hx   ? ?Past Medical History:  ?Diagnosis Date  ? Acute exacerbation of CHF (congestive heart failure) ([place]) 04/05/2021  ? Acute on chronic heart failure ([place]) 10/28/2020  ? Acute pain of left foot 05/03/2017  ? Asthma   ? Asthma, chronic, mild persistent, uncomplicated 45/80/9983  ? Charcot ankle, left 05/31/2017  ? CHF (congestive heart failure) ([place])   ? Chronic systolic (congestive) heart failure (HCC)   ? Diabetes mellitus ([place])   ? Diabetes mellitus type 2 in obese ([place]) 04/21/2014  ? Diabetic polyneuropathy associated with type 2 diabetes mellitus ([place]) 03/14/2017  ? Essential hypertension 10/26/2016  ? Hypertension   ? Obesity   ? Obesity, Class III, BMI 40-49.9 (morbid obesity) ([place]) 10/26/2016  ? OSA (obstructive sleep apnea) 12/02/2016  ? Type 2 diabetes mellitus with diabetic neuropathy (HCC)   ? ?Current Outpatient Medications  ?Medication Sig Dispense Refill  ? Accu-Chek Softclix Lancets lancets Check blood sugar 3x a day 100 each 11  ? albuterol (VENTOLIN HFA) 108 (90 Base) MCG/ACT inhaler Inhale 2 puffs into the lungs every 4 (four) hours as needed for wheezing or shortness of breath. 18 g 0  ? amiodarone (PACERONE) 200 MG  tablet Take 1 tablet (200 mg total) by mouth daily. 31 tablet 5  ? apixaban (ELIQUIS) 5 MG TABS tablet Take 1 tablet (5 mg total) by mouth 2 (two) times daily. 60 tablet 2  ? azelastine (ASTELIN) 0.1 % nasal spray Place into both nostrils as needed for rhinitis. Use in each nostril as directed    ? blood glucose meter kit and supplies KIT Dispense based on patient and insurance preference. Use up to four times daily as directed. (FOR ICD-9 250.00, 250.01). For QAC - HS accuchecks. May switch to any brand. 1 each 1  ? Continuous Blood Gluc Sensor (FREESTYLE LIBRE 2 SENSOR) MISC Use to check blood sugar at least 6 times a day 2 each 11  ? [employer] SENNA 8.6  MG tablet Take 1 tablet by mouth daily as needed for constipation.    ? Dulaglutide (TRULICITY) 1.5 SN/0.5LZ SOPN Inject 1 mg into the skin once a week. Patient takes every Monday.    ? gabapentin (NEURONTIN) 300 MG capsule Take 300 mg by mouth as needed (pain).    ? glucose blood (ACCU-CHEK GUIDE) test strip Check blood sugar 3 times per day 100 each 11  ? hyoscyamine (LEVSIN SL) 0.125 MG SL tablet Place 0.125 mg under the tongue every 4 (four) hours as needed for cramping (secretions).    ? insulin aspart (NOVOLOG) 100 UNIT/ML injection Inject 20 Units into the skin 3 (three) times daily with meals. 20 mL 11  ? insulin glargine (LANTUS) 100 UNIT/ML injection Inject 0.3 mLs (30 Units total) into the skin 2 (two) times daily. 20 mL 11  ? Insulin Syringe-Needle U-100 30G X 1/2" 1 ML MISC Use to inject insulin 4 times a day. 100 each 2  ? loratadine (CLARITIN) 10 MG tablet Take 1 tablet (10 mg total) by mouth 2 (two) times daily as needed for allergies (Can use an extra dose during flare ups.). 60 tablet 5  ? metolazone (ZAROXOLYN) 2.5 MG tablet Take 1 tablet (2.5 mg total) by mouth 2 (two) times a week. Take 1 tablet on Tuesdays and Fridays 10 tablet 5  ? nitroGLYCERIN (NITROSTAT) 0.3 MG SL tablet Place 0.3 mg under the tongue every 5 (five) minutes as needed for chest  pain.    ? ondansetron (ZOFRAN) 4 MG tablet Take 4 mg by mouth daily as needed for nausea or vomiting.    ? pantoprazole (PROTONIX) 40 MG tablet TAKE 1 TABLET BY MOUTH EVERY DAY 30 tablet 1  ? potassium chloride

## 2021-06-23 ENCOUNTER — Encounter (HOSPITAL_COMMUNITY): Payer: Medicaid Other

## 2021-07-03 ENCOUNTER — Encounter (HOSPITAL_COMMUNITY): Payer: Medicaid Other | Admitting: Internal Medicine

## 2021-08-07 ENCOUNTER — Encounter (HOSPITAL_COMMUNITY): Payer: Self-pay | Admitting: *Deleted

## 2023-05-02 IMAGING — DX DG CHEST 1V PORT
1 series · 1 of 1 positions shown · non-contrast
Comparison: 10/26/2016

CLINICAL DATA: Cough, dizziness and near syncope.

EXAM:
PORTABLE CHEST 1 VIEW

[chest]
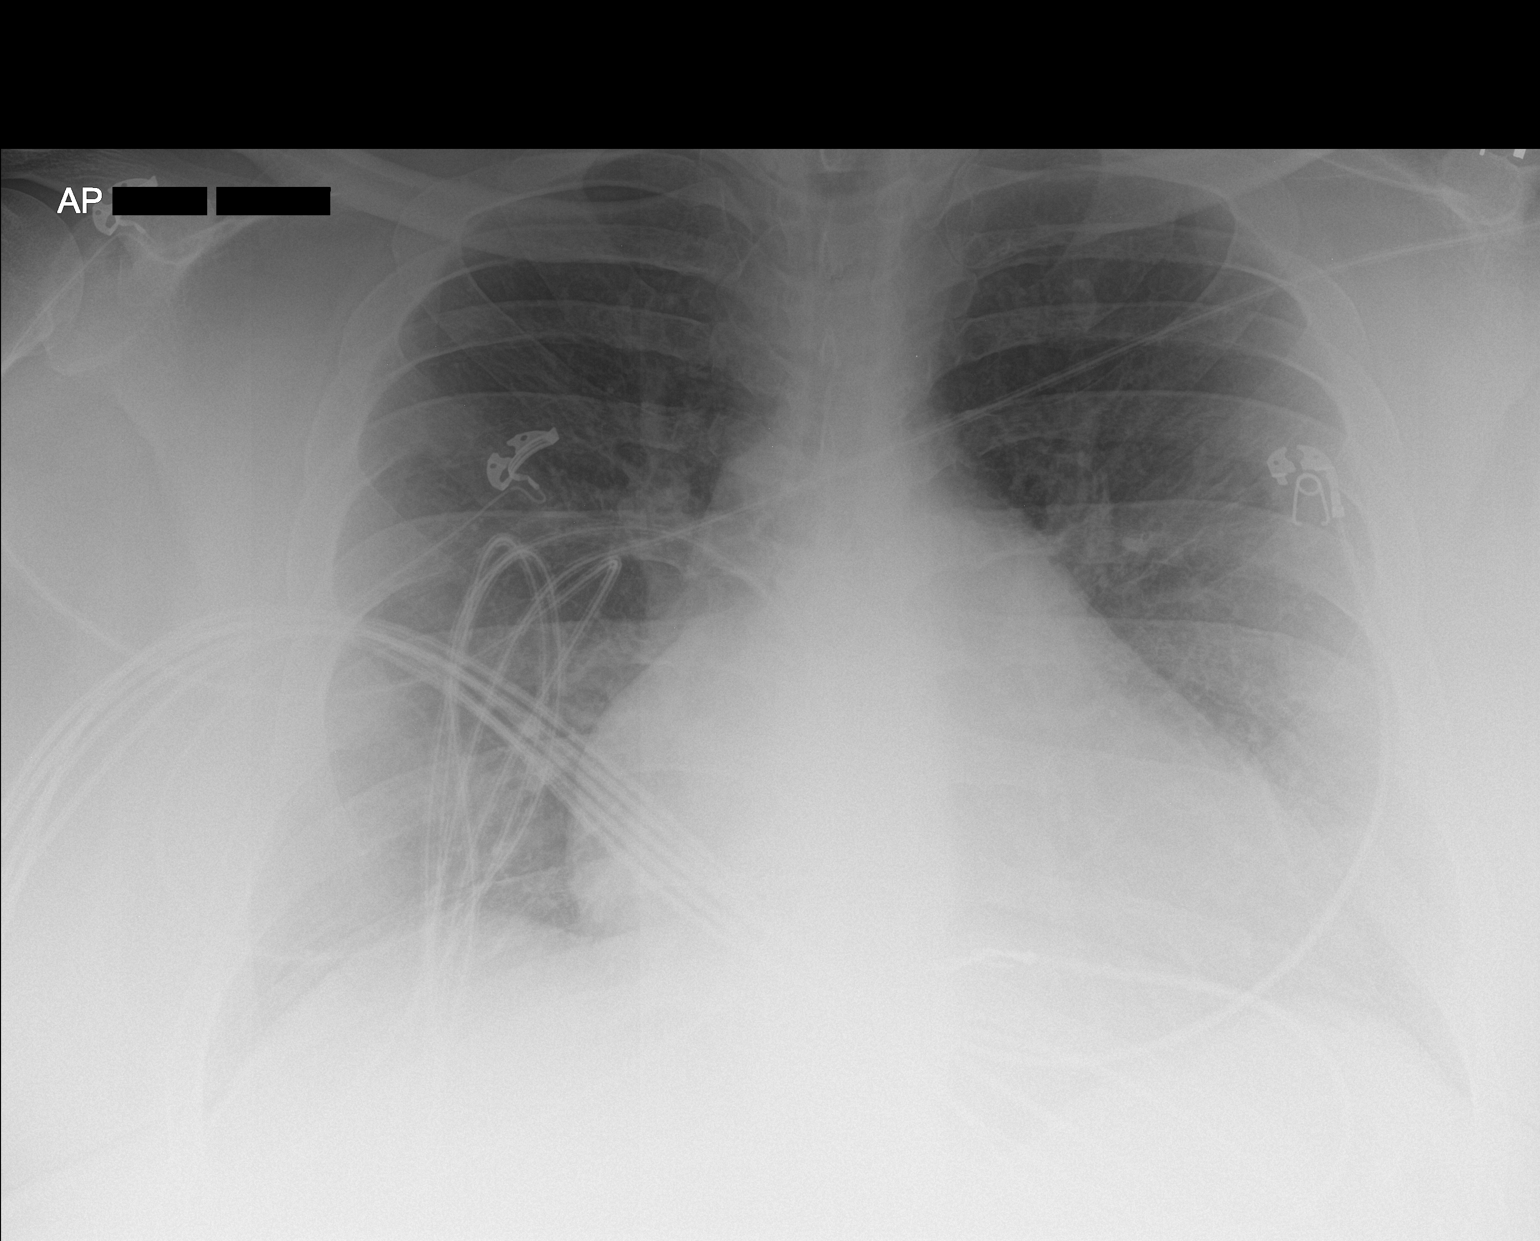

[1 of 1 positions shown; findings below may reference images not displayed]

FINDINGS: The heart is enlarged but appears relatively stable. The mediastinal
and hilar contours are within normal limits.

No acute pulmonary findings. No pleural effusions or pulmonary
lesions. No pneumothorax. The bony thorax is intact.
IMPRESSION: Stable cardiac enlargement but no acute pulmonary findings.
